# Patient Record
Sex: Male | Born: 1955
Health system: Southern US, Community
[De-identification: ages and names within clinical notes are randomized; demographics above are authoritative.]

## PROBLEM LIST (undated history)

## (undated) DIAGNOSIS — G4733 Obstructive sleep apnea (adult) (pediatric): Secondary | ICD-10-CM

## (undated) DIAGNOSIS — R51 Headache: Secondary | ICD-10-CM

## (undated) DIAGNOSIS — Z9989 Dependence on other enabling machines and devices: Secondary | ICD-10-CM

## (undated) DIAGNOSIS — R519 Headache, unspecified: Secondary | ICD-10-CM

## (undated) DIAGNOSIS — K219 Gastro-esophageal reflux disease without esophagitis: Secondary | ICD-10-CM

## (undated) DIAGNOSIS — I480 Paroxysmal atrial fibrillation: Secondary | ICD-10-CM

## (undated) DIAGNOSIS — Z9289 Personal history of other medical treatment: Secondary | ICD-10-CM

## (undated) DIAGNOSIS — L03211 Cellulitis of face: Secondary | ICD-10-CM

## (undated) DIAGNOSIS — R7989 Other specified abnormal findings of blood chemistry: Secondary | ICD-10-CM

## (undated) DIAGNOSIS — I2510 Atherosclerotic heart disease of native coronary artery without angina pectoris: Secondary | ICD-10-CM

## (undated) DIAGNOSIS — E669 Obesity, unspecified: Secondary | ICD-10-CM

## (undated) DIAGNOSIS — K7460 Unspecified cirrhosis of liver: Secondary | ICD-10-CM

## (undated) DIAGNOSIS — R001 Bradycardia, unspecified: Secondary | ICD-10-CM

## (undated) DIAGNOSIS — L409 Psoriasis, unspecified: Secondary | ICD-10-CM

## (undated) DIAGNOSIS — D696 Thrombocytopenia, unspecified: Secondary | ICD-10-CM

## (undated) DIAGNOSIS — I509 Heart failure, unspecified: Secondary | ICD-10-CM

## (undated) DIAGNOSIS — M1990 Unspecified osteoarthritis, unspecified site: Secondary | ICD-10-CM

## (undated) DIAGNOSIS — E785 Hyperlipidemia, unspecified: Secondary | ICD-10-CM

## (undated) DIAGNOSIS — K760 Fatty (change of) liver, not elsewhere classified: Secondary | ICD-10-CM

## (undated) DIAGNOSIS — R945 Abnormal results of liver function studies: Secondary | ICD-10-CM

## (undated) DIAGNOSIS — M545 Low back pain: Secondary | ICD-10-CM

## (undated) DIAGNOSIS — I639 Cerebral infarction, unspecified: Secondary | ICD-10-CM

## (undated) HISTORY — PX: CARDIAC CATHETERIZATION: SHX172

## (undated) HISTORY — DX: Gastro-esophageal reflux disease without esophagitis: K21.9

## (undated) HISTORY — PX: TONSILLECTOMY: SUR1361

## (undated) HISTORY — DX: Low back pain: M54.5

## (undated) HISTORY — DX: Psoriasis, unspecified: L40.9

## (undated) HISTORY — PX: OTHER SURGICAL HISTORY: SHX169

## (undated) HISTORY — PX: FACIAL COSMETIC SURGERY: SHX629

## (undated) HISTORY — DX: Morbid (severe) obesity due to excess calories: E66.01

## (undated) HISTORY — DX: Paroxysmal atrial fibrillation: I48.0

## (undated) HISTORY — PX: KNEE ARTHROPLASTY: SHX992

---

## 2003-12-12 ENCOUNTER — Inpatient Hospital Stay (HOSPITAL_COMMUNITY): Admission: EM | Admit: 2003-12-12 | Discharge: 2003-12-17 | Payer: Self-pay | Admitting: Emergency Medicine

## 2005-11-21 HISTORY — PX: LAPAROSCOPIC CHOLECYSTECTOMY: SUR755

## 2006-08-12 ENCOUNTER — Ambulatory Visit: Payer: Self-pay | Admitting: Internal Medicine

## 2006-08-12 ENCOUNTER — Inpatient Hospital Stay (HOSPITAL_COMMUNITY): Admission: EM | Admit: 2006-08-12 | Discharge: 2006-08-15 | Payer: Self-pay | Admitting: Emergency Medicine

## 2007-11-02 ENCOUNTER — Encounter: Payer: Self-pay | Admitting: Cardiology

## 2010-03-17 ENCOUNTER — Emergency Department (HOSPITAL_COMMUNITY): Admission: EM | Admit: 2010-03-17 | Discharge: 2010-03-17 | Payer: Self-pay | Admitting: Emergency Medicine

## 2011-07-29 ENCOUNTER — Ambulatory Visit (HOSPITAL_BASED_OUTPATIENT_CLINIC_OR_DEPARTMENT_OTHER)
Admission: RE | Admit: 2011-07-29 | Discharge: 2011-07-29 | Disposition: A | Discharge status: Home / Self Care | Payer: BC Managed Care – PPO | Source: Ambulatory Visit | Attending: Internal Medicine | Admitting: Internal Medicine

## 2011-07-29 ENCOUNTER — Encounter: Payer: Self-pay | Admitting: Internal Medicine

## 2011-07-29 ENCOUNTER — Ambulatory Visit (INDEPENDENT_AMBULATORY_CARE_PROVIDER_SITE_OTHER): Payer: BC Managed Care – PPO | Admitting: Internal Medicine

## 2011-07-29 DIAGNOSIS — L408 Other psoriasis: Secondary | ICD-10-CM

## 2011-07-29 DIAGNOSIS — R252 Cramp and spasm: Secondary | ICD-10-CM

## 2011-07-29 DIAGNOSIS — E785 Hyperlipidemia, unspecified: Secondary | ICD-10-CM

## 2011-07-29 DIAGNOSIS — R5383 Other fatigue: Secondary | ICD-10-CM | POA: Insufficient documentation

## 2011-07-29 DIAGNOSIS — M2550 Pain in unspecified joint: Secondary | ICD-10-CM

## 2011-07-29 DIAGNOSIS — R748 Abnormal levels of other serum enzymes: Secondary | ICD-10-CM

## 2011-07-29 DIAGNOSIS — K7689 Other specified diseases of liver: Secondary | ICD-10-CM | POA: Insufficient documentation

## 2011-07-29 DIAGNOSIS — L409 Psoriasis, unspecified: Secondary | ICD-10-CM | POA: Insufficient documentation

## 2011-07-29 DIAGNOSIS — R7402 Elevation of levels of lactic acid dehydrogenase (LDH): Secondary | ICD-10-CM

## 2011-07-29 DIAGNOSIS — M791 Myalgia, unspecified site: Secondary | ICD-10-CM

## 2011-07-29 DIAGNOSIS — R5381 Other malaise: Secondary | ICD-10-CM

## 2011-07-29 DIAGNOSIS — I2510 Atherosclerotic heart disease of native coronary artery without angina pectoris: Secondary | ICD-10-CM

## 2011-07-29 DIAGNOSIS — IMO0001 Reserved for inherently not codable concepts without codable children: Secondary | ICD-10-CM

## 2011-07-29 DIAGNOSIS — R945 Abnormal results of liver function studies: Secondary | ICD-10-CM | POA: Insufficient documentation

## 2011-07-29 DIAGNOSIS — F17200 Nicotine dependence, unspecified, uncomplicated: Secondary | ICD-10-CM | POA: Insufficient documentation

## 2011-07-29 LAB — HEPATIC FUNCTION PANEL
AST: 90 U/L — ABNORMAL HIGH (ref 0–37)
Albumin: 4.3 g/dL (ref 3.5–5.2)
Bilirubin, Direct: 0.1 mg/dL (ref 0.0–0.3)
Indirect Bilirubin: 0.5 mg/dL (ref 0.0–0.9)

## 2011-07-29 LAB — BASIC METABOLIC PANEL
Glucose, Bld: 92 mg/dL (ref 70–99)
Sodium: 139 mEq/L (ref 135–145)

## 2011-07-29 LAB — TSH: TSH: 1.573 u[IU]/mL (ref 0.350–4.500)

## 2011-07-29 LAB — SEDIMENTATION RATE: Sed Rate: 1 mm/hr (ref 0–16)

## 2011-07-29 LAB — CBC
HCT: 44.7 % (ref 39.0–52.0)
MCH: 31.2 pg (ref 26.0–34.0)
MCHC: 33.6 g/dL (ref 30.0–36.0)
MCV: 92.9 fL (ref 78.0–100.0)
Platelets: 101 10*3/uL — ABNORMAL LOW (ref 150–400)
RDW: 13 % (ref 11.5–15.5)

## 2011-07-29 LAB — RHEUMATOID FACTOR: Rhuematoid fact SerPl-aCnc: <10 IU/mL (ref <=14)

## 2011-07-29 MED ORDER — BUPROPION HCL ER (SR) 150 MG PO TB12: 150.0000 mg | ORAL_TABLET | Freq: Two times a day (BID) | ORAL | Status: DC

## 2011-07-29 NOTE — Assessment & Plan Note (Signed)
Followed by dermatology. Consider possible psoriatic arthritis pending lab workup. Consider rheumatology evaluation

## 2011-07-29 NOTE — Assessment & Plan Note (Signed)
Obtain cbc, chem7, tsh. Obtain testosterone next visit if early am.

## 2011-07-29 NOTE — Assessment & Plan Note (Signed)
Obtain lipid profile at next visit when fasting. Begin liver evaluation.

## 2011-07-29 NOTE — Assessment & Plan Note (Signed)
Obtain chem7 and CK. Begin mag ox po qd.

## 2011-07-29 NOTE — Progress Notes (Signed)
  Subjective:    Patient ID: Vincent Ochoa, male    DOB: February 12, 1956, 55 y.o.   MRN: [id number]  HPI Pt presents to clinic to establish care and for evaluation of multiple medical problems. States was told in the past by hospital and dermatologist had elevated LFT's. Unaware of further evaluation. Recalls possible remote hepatitis exposure and was given unspecified pills to take. Does not take tylenol and use etoh sparingly. Took MTX remotely for psoriasis but not for years. H/o CAD s/p stent placement ~ 2007 and possible 2010. Stopped all medications including ASA. Denies cp or dyspnea. Smoking ~ 1.5 ppd and is ready to attempt cessation. C/o diffuse intermittent muscle cramps. C/o fatigue, decreased libido and ED. Has intermittent MCP pain and swelling. Believes has intermittent swelling of individual fingers. BP elevated on triage but notes stressful conversation preceeding. Repeated bp 120/80. No other complaints.  Reviewed pmh, psh, medications, allergies, soc hx and fam hx    Review of Systems  Constitutional: Negative for fever, chills and unexpected weight change.  Respiratory: Negative for cough and shortness of breath.   Cardiovascular: Negative for chest pain and palpitations.  Gastrointestinal: Negative for abdominal pain and blood in stool.  Musculoskeletal: Positive for myalgias, joint swelling and arthralgias.  Skin: Positive for color change and rash.  All other systems reviewed and are negative.       Objective:   Physical Exam  Physical Exam  Nursing note and vitals reviewed. Constitutional: Appears well-developed and well-nourished. No distress.  HENT:  Head: Normocephalic and atraumatic.  Right Ear: External ear normal.  Left Ear: External ear normal.  Eyes: Conjunctivae are normal. No scleral icterus.  Neck: Neck supple. Carotid bruit is not present.  Cardiovascular: Normal rate, regular rhythm and normal heart sounds.  Exam reveals no gallop and no friction rub.     No murmur heard. Pulmonary/Chest: Effort normal and breath sounds normal. No respiratory distress. He has no wheezes. no rales.  Abd: soft, ND, NT, +BS. No masses or organomegaly. Lymphadenopathy:    He has no cervical adenopathy.  Neurological:Alert.  Skin: Skin is warm and dry. Not diaphoretic. +psoriatic plaques extensor surfaces Psychiatric: Has a normal mood and affect.   MSK: no sausage digits. No inflammatory changes of hands.       Assessment & Plan:

## 2011-07-29 NOTE — Assessment & Plan Note (Signed)
Asx. Begin asa 325mg  po qd. Risk factor modification. Discuss cardiology referral next visit

## 2011-07-29 NOTE — Assessment & Plan Note (Signed)
Obtain lft, iron, hepatitis panel and obtain RUQ [place]. Close followup scheduled.

## 2011-07-29 NOTE — Assessment & Plan Note (Signed)
Some inflammatory features by hx. Obtain esr and RF.

## 2011-07-29 NOTE — Assessment & Plan Note (Signed)
Counseled regarding the need for cessation and states agreement. Requests medication assistance. Begin otc nicotine patches and understands cannot smoke while using. Begin wellbutrin. Total time of discussion ~10 mins.

## 2011-07-29 NOTE — Patient Instructions (Signed)
Please take magnesium oxide one a day for cramps Please schedule next appointment in the morning for additional labs

## 2011-07-31 LAB — HEPATITIS PANEL, ACUTE
HCV Ab: NEGATIVE
Hep B C IgM: NEGATIVE

## 2011-08-03 ENCOUNTER — Other Ambulatory Visit: Payer: Self-pay | Admitting: *Deleted

## 2011-08-03 DIAGNOSIS — M791 Myalgia, unspecified site: Secondary | ICD-10-CM

## 2011-08-03 DIAGNOSIS — D696 Thrombocytopenia, unspecified: Secondary | ICD-10-CM

## 2011-08-06 LAB — PLATELET COUNT: Platelets: 112 10*3/uL — ABNORMAL LOW (ref 150–400)

## 2011-08-11 ENCOUNTER — Encounter: Payer: Self-pay | Admitting: Cardiology

## 2011-08-12 ENCOUNTER — Encounter: Payer: Self-pay | Admitting: Cardiology

## 2011-08-15 ENCOUNTER — Encounter: Payer: Self-pay | Admitting: Internal Medicine

## 2011-08-15 ENCOUNTER — Ambulatory Visit (INDEPENDENT_AMBULATORY_CARE_PROVIDER_SITE_OTHER): Payer: BC Managed Care – PPO | Admitting: Internal Medicine

## 2011-08-15 DIAGNOSIS — E785 Hyperlipidemia, unspecified: Secondary | ICD-10-CM

## 2011-08-15 DIAGNOSIS — I2510 Atherosclerotic heart disease of native coronary artery without angina pectoris: Secondary | ICD-10-CM

## 2011-08-15 DIAGNOSIS — R748 Abnormal levels of other serum enzymes: Secondary | ICD-10-CM | POA: Insufficient documentation

## 2011-08-15 DIAGNOSIS — K219 Gastro-esophageal reflux disease without esophagitis: Secondary | ICD-10-CM

## 2011-08-15 DIAGNOSIS — R6882 Decreased libido: Secondary | ICD-10-CM | POA: Insufficient documentation

## 2011-08-15 MED ORDER — NITROGLYCERIN 0.4 MG SL SUBL: 0.4000 mg | SUBLINGUAL_TABLET | SUBLINGUAL | Status: DC | PRN

## 2011-08-15 MED ORDER — OMEPRAZOLE 40 MG PO CPDR: 40.0000 mg | DELAYED_RELEASE_CAPSULE | Freq: Every day | ORAL | Status: DC

## 2011-08-15 NOTE — Assessment & Plan Note (Addendum)
Recent reported nl stress test. Continue asa daily. Provide with prescription for ntg sl prn. Cardiology referral for surveillance. Regular exercise encourage. Congratulated on tobacco cessation. Obtain lipid profile and discussed possible statin therapy though would not pursue currently with workup for elevated CK enzymes

## 2011-08-15 NOTE — Progress Notes (Signed)
  Subjective:    Patient ID: Vincent Ochoa, male    DOB: 02/28/1955, 55 y.o.   MRN: [id number]  HPI Pt presents to clinic for followup of multiple medical problems. Recently hospitalized at outside hospital for CP. Describes nonexertional low cp vs epigastric pain. No records available however per description appears underwent serial cardiac enzymes, ekg without evidence of infarct. Underwent chemical nuclear stress test prior to discharge that pt was told was nl.   Last visit was evaluated for diffuse muscle cramps and myalgias. CK noted to be ~2000 and confirmed stable on repeat last week. (was told at hospital total ck high as well.) does not take statin tx or any other medications other than ASA and wellbutrin. Renal fxn nl. No out of the ordinary injury or exercise recently. Renal fxn remains nl.   H/o intermittent arthralgias often involving MCP's. H/o psoriasis followed by dermatology. Recent ESR and RF nl.   Complains of chronic nightly sensation of something stuck in throat or irritating throat. Occurs after lying down. Has regular GERD sx's after eating.   Continues with decreased libido. Abnormal lft's reviewed with neg hepatitis panel, iron levels and US demonstrating fatty liver infiltration.  Congratulated on quitting smoking after last visit. Taking wellbutrin currently and did not begin nicotine patches.   Past Medical History  Diagnosis Date  . History of chicken pox     childhood  . GERD (gastroesophageal reflux disease)   . Psoriasis   . Chest pain    Past Surgical History  Procedure Date  . Cholecystectomy 2007    reports that he quit smoking about 2 weeks ago. His smoking use included Cigarettes. He has never used smokeless tobacco. He reports that he drinks alcohol. He reports that he does not use illicit drugs. family history includes Arthritis in his maternal grandmother; Cirrhosis in his mother; and Heart disease in his sister and unspecified family member. No Known  Allergies   Review of Systems see hpi    Objective:   Physical Exam  Nursing note and vitals reviewed. Constitutional: He appears well-developed and well-nourished. No distress.  HENT:  Head: Normocephalic and atraumatic.  Neurological: He is alert.  Skin: He is not diaphoretic.  Psychiatric: He has a normal mood and affect.          Assessment & Plan:

## 2011-08-15 NOTE — Assessment & Plan Note (Signed)
Begin omeprazole 40mg  po qd. Consider GI evaluation/EGD if no significant improvement after ~4 wks

## 2011-08-15 NOTE — Assessment & Plan Note (Signed)
Suspect underlying fatty liver disease. Recommend dietary modification, regular exercise and wt loss.

## 2011-08-15 NOTE — Assessment & Plan Note (Signed)
Obtain early am testosterone level 

## 2011-08-15 NOTE — Assessment & Plan Note (Signed)
Confirmed with repeat lab evaluations. Nl renal fxn and no obvious trigger. Schedule rheumatology consult.

## 2011-08-16 LAB — LIPID PANEL
LDL Cholesterol: 148 mg/dL — ABNORMAL HIGH (ref 0–99)
Total CHOL/HDL Ratio: 7.5 Ratio
Triglycerides: 164 mg/dL — ABNORMAL HIGH (ref <150)
VLDL: 33 mg/dL (ref 0–40)

## 2011-08-17 LAB — TESTOSTERONE: Testosterone: 342.93 ng/dL (ref 250–890)

## 2011-08-18 ENCOUNTER — Ambulatory Visit: Payer: BC Managed Care – PPO | Admitting: Internal Medicine

## 2011-08-24 ENCOUNTER — Institutional Professional Consult (permissible substitution): Payer: BC Managed Care – PPO | Admitting: Cardiology

## 2011-09-21 ENCOUNTER — Encounter: Payer: Self-pay | Admitting: Cardiology

## 2011-09-21 ENCOUNTER — Ambulatory Visit (INDEPENDENT_AMBULATORY_CARE_PROVIDER_SITE_OTHER): Payer: BC Managed Care – PPO | Admitting: Cardiology

## 2011-09-21 VITALS — BP 110/68 | HR 81 | Resp 18 | Ht 75.0 in | Wt 291.0 lb

## 2011-09-21 DIAGNOSIS — I2510 Atherosclerotic heart disease of native coronary artery without angina pectoris: Secondary | ICD-10-CM

## 2011-09-21 DIAGNOSIS — R079 Chest pain, unspecified: Secondary | ICD-10-CM | POA: Insufficient documentation

## 2011-09-21 NOTE — Assessment & Plan Note (Signed)
Patient's symptoms were atypical. Apparently his enzymes were negative although the CK total was elevated. He also had a stress test that apparently was normal. We will obtain all records from [facility]. Continued medical therapy including aspirin. If his CK is not extremely elevated we will add a statin. Patient counseled on lifestyle modification.

## 2011-09-21 NOTE — Progress Notes (Signed)
HPI: 55 year old male male for evaluation of chest pain. Patient admitted in 2007 with chest pain and had an abnormal nuclear study; enzymes negative. He subsequently underwent cardiac catheterization by Dr. Katrinka Blazing which revealed; EF is 60%. Ostial LM less than 30% narrowing. No significant LAD disease. 30-40 OM; The right coronary artery contains mid 30-40% stenosis with suggestion of possible ruptured plaque, but there is no flow-limiting lesion noted.  Medical therapy recommended. Echo in 2007 showed an EF of 50. Patient apparently had a cardiac catheterization at [facility] in 2008. No intervention. Records not available. Approximately 2 months ago the patient had 15 minutes of chest pain. It was described as a cramping sensation. He was admitted to [facility] and apparently ruled out. He does state that his CK was elevated. A stress test was normal by his report. Records not available. Since then he does have dyspnea on exertion but no orthopnea, PND, pedal edema, exertional chest pain or syncope.  Current Outpatient Prescriptions  Medication Sig Dispense Refill  . aspirin 81 MG tablet Take 81 mg by mouth daily. Takes 3 tabs daily       . buPROPion (WELLBUTRIN SR) 150 MG 12 hr tablet Take 1 tablet (150 mg total) by mouth 2 (two) times daily.  60 tablet  2  . nitroGLYCERIN (NITROSTAT) 0.4 MG SL tablet Place 1 tablet (0.4 mg total) under the tongue every 5 (five) minutes as needed for chest pain.  20 tablet  11  . omeprazole (PRILOSEC) 40 MG capsule Take 1 capsule (40 mg total) by mouth daily.  30 capsule  1    No Known Allergies  Past Medical History  Diagnosis Date  . History of chicken pox     childhood  . GERD (gastroesophageal reflux disease)   . Psoriasis     Past Surgical History  Procedure Date  . Cholecystectomy 2007  . Tonsillectomy     History   Social History  . Marital Status: Married    Spouse Name: N/A    Number of Children: N/A  . Years of  Education: N/A   Occupational History  . Not on file.   Social History Main Topics  . Smoking status: Former Smoker    Types: Cigarettes    Quit date: 08/01/2011  . Smokeless tobacco: Never Used   Comment: started 1973 1 ppd  . Alcohol Use: Yes     Occasional  . Drug Use: No  . Sexually Active: Not on file   Other Topics Concern  . Not on file   Social History Narrative  . No narrative on file    Family History  Problem Relation Age of Onset  . Arthritis Maternal Grandmother   . Heart disease Sister   . Heart disease      maternal/paternal grandparents  . Cirrhosis Mother     deceased age 89    ROS: fatigue and chronic cough but no fevers or chills, hemoptysis, dysphasia, odynophagia, melena, hematochezia, dysuria, hematuria, rash, seizure activity, orthopnea, PND, pedal edema, claudication. Remaining systems are negative.  Physical Exam:  Blood pressure 110/68, pulse 81, resp. rate 18, height 6\' 3"  (1.905 m), weight 291 lb (131.997 kg).  General:  Well developed/well nourished in NAD Skin warm/dry Patient not depressed No peripheral clubbing Back-normal HEENT-normal/normal eyelids Neck supple/normal carotid upstroke bilaterally; no bruits; no JVD; no thyromegaly chest - CTA/ normal expansion CV - RRR/normal S1 and S2; no murmurs, rubs or gallops;  PMI nondisplaced Abdomen -NT/ND, no  HSM, no mass, + bowel sounds, no bruit 2+ femoral pulses, no bruits Ext-no edema, chords, 2+ DP Neuro-grossly nonfocal  ECG NSR with no ECG changes

## 2011-09-21 NOTE — Patient Instructions (Signed)
Your physician wants you to follow-up in: ONE YEAR You will receive a reminder letter in the mail two months in advance. If you don't receive a letter, please call our office to schedule the follow-up appointment.  

## 2011-09-21 NOTE — Assessment & Plan Note (Signed)
Continue aspirin. Add statin if previous CK not elevated.

## 2011-09-22 ENCOUNTER — Telehealth: Payer: Self-pay | Admitting: Cardiology

## 2011-09-22 NOTE — Telephone Encounter (Addendum)
ROI faxed to [facility] @ [phone]  09/22/11/km  Records received from [facility] Gave to [place]  09/22/11/km

## 2011-09-28 ENCOUNTER — Ambulatory Visit: Payer: BC Managed Care – PPO | Admitting: Internal Medicine

## 2011-09-28 DIAGNOSIS — Z0289 Encounter for other administrative examinations: Secondary | ICD-10-CM

## 2012-08-13 ENCOUNTER — Emergency Department (HOSPITAL_BASED_OUTPATIENT_CLINIC_OR_DEPARTMENT_OTHER): Payer: Self-pay

## 2012-08-13 ENCOUNTER — Emergency Department (HOSPITAL_BASED_OUTPATIENT_CLINIC_OR_DEPARTMENT_OTHER)
Admission: EM | Admit: 2012-08-13 | Discharge: 2012-08-13 | Disposition: A | Discharge status: Home / Self Care | Payer: Self-pay | Attending: Emergency Medicine | Admitting: Emergency Medicine

## 2012-08-13 ENCOUNTER — Encounter (HOSPITAL_BASED_OUTPATIENT_CLINIC_OR_DEPARTMENT_OTHER): Payer: Self-pay | Admitting: *Deleted

## 2012-08-13 DIAGNOSIS — L408 Other psoriasis: Secondary | ICD-10-CM | POA: Insufficient documentation

## 2012-08-13 DIAGNOSIS — Z9089 Acquired absence of other organs: Secondary | ICD-10-CM | POA: Insufficient documentation

## 2012-08-13 DIAGNOSIS — I2510 Atherosclerotic heart disease of native coronary artery without angina pectoris: Secondary | ICD-10-CM | POA: Insufficient documentation

## 2012-08-13 DIAGNOSIS — K219 Gastro-esophageal reflux disease without esophagitis: Secondary | ICD-10-CM | POA: Insufficient documentation

## 2012-08-13 DIAGNOSIS — Z7982 Long term (current) use of aspirin: Secondary | ICD-10-CM | POA: Insufficient documentation

## 2012-08-13 DIAGNOSIS — J329 Chronic sinusitis, unspecified: Secondary | ICD-10-CM | POA: Insufficient documentation

## 2012-08-13 DIAGNOSIS — Z9861 Coronary angioplasty status: Secondary | ICD-10-CM | POA: Insufficient documentation

## 2012-08-13 DIAGNOSIS — Z87891 Personal history of nicotine dependence: Secondary | ICD-10-CM | POA: Insufficient documentation

## 2012-08-13 DIAGNOSIS — H8110 Benign paroxysmal vertigo, unspecified ear: Secondary | ICD-10-CM | POA: Insufficient documentation

## 2012-08-13 HISTORY — DX: Atherosclerotic heart disease of native coronary artery without angina pectoris: I25.10

## 2012-08-13 MED ORDER — MECLIZINE HCL 50 MG PO TABS: 50.0000 mg | ORAL_TABLET | Freq: Three times a day (TID) | ORAL | Status: DC | PRN

## 2012-08-13 MED ORDER — AMOXICILLIN-POT CLAVULANATE 500-125 MG PO TABS: 1.0000 | ORAL_TABLET | Freq: Three times a day (TID) | ORAL | Status: DC

## 2012-08-13 NOTE — ED Provider Notes (Signed)
History     CSN: [id number]  Arrival date & time 08/13/12  1252   First MD Initiated Contact with Patient 08/13/12 1342      Chief Complaint  Patient presents with  . Dizziness     HPI Patient with three-day history of vertigo and dizziness associated with nausea.  Symptoms started out as a head cold.  Symptoms reproduced with movement of the head.  Patient denies she'll lateral weakness or numbness.  Denies headache. Past Medical History  Diagnosis Date  . History of chicken pox     childhood  . GERD (gastroesophageal reflux disease)   . Psoriasis   . Coronary artery disease     Past Surgical History  Procedure Date  . Cholecystectomy 2007  . Tonsillectomy   . Cardiac stents     Family History  Problem Relation Age of Onset  . Arthritis Maternal Grandmother   . Heart disease Sister   . Heart disease      maternal/paternal grandparents  . Cirrhosis Mother     deceased age 69    History  Substance Use Topics  . Smoking status: Former Smoker    Types: Cigarettes    Quit date: 08/01/2011  . Smokeless tobacco: Never Used   Comment: started 1973 1 ppd  . Alcohol Use: Yes     Occasional      Review of Systems All his systems negative Allergies  Review of patient's allergies indicates no known allergies.  Home Medications   Current Outpatient Rx  Name Route Sig Dispense Refill  . AMOXICILLIN-POT CLAVULANATE 500-125 MG PO TABS Oral Take 1 tablet (500 mg total) by mouth every 8 (eight) hours. 21 tablet 0  . ASPIRIN 81 MG PO TABS Oral Take 81 mg by mouth daily. Takes 3 tabs daily     . BUPROPION HCL ER (SR) 150 MG PO TB12 Oral Take 1 tablet (150 mg total) by mouth 2 (two) times daily. 60 tablet 2  . MECLIZINE HCL 50 MG PO TABS Oral Take 1 tablet (50 mg total) by mouth 3 (three) times daily as needed. 30 tablet 0  . NITROGLYCERIN 0.4 MG SL SUBL Sublingual Place 1 tablet (0.4 mg total) under the tongue every 5 (five) minutes as needed for chest pain. 20 tablet  11  . OMEPRAZOLE 40 MG PO CPDR Oral Take 1 capsule (40 mg total) by mouth daily. 30 capsule 1    BP 133/78  Pulse 78  Temp 98.1 F (36.7 C) (Oral)  Resp 18  Ht 6\' 3"  (1.905 m)  Wt 295 lb (133.811 kg)  BMI 36.87 kg/m2  SpO2 96%  Physical Exam  Nursing note and vitals reviewed. Constitutional: He is oriented to person, place, and time. He appears well-developed. No distress.  HENT:  Head: Normocephalic and atraumatic.  Eyes: Pupils are equal, round, and reactive to light.  Neck: Normal range of motion.  Cardiovascular: Normal rate and intact distal pulses.   Pulmonary/Chest: No respiratory distress.  Abdominal: Normal appearance. He exhibits no distension.  Musculoskeletal: Normal range of motion.  Neurological: He is alert and oriented to person, place, and time. He has normal strength. No cranial nerve deficit or sensory deficit. He displays a negative Romberg sign. Coordination and gait normal. GCS eye subscore is 4. GCS verbal subscore is 5. GCS motor subscore is 6.  Skin: Skin is warm and dry. No rash noted.  Psychiatric: He has a normal mood and affect. His behavior is normal.    ED  Course  Procedures (including critical care time)  Labs Reviewed - No data to display Ct Head Wo Contrast  08/13/2012  *RADIOLOGY REPORT*  Clinical Data: Nausea for 3 days  CT HEAD WITHOUT CONTRAST  Technique:  Contiguous axial images were obtained from the base of the skull through the vertex without contrast.  Comparison: None.  Findings: No acute intracranial hemorrhage.  No focal mass lesion. No CT evidence of acute infarction.   No midline shift or mass effect.  No hydrocephalus.  Basilar cisterns are patent. Paranasal sinuses and mastoid air cells are clear.  Orbits are normal.  IMPRESSION: No acute intracranial findings.   Original Report Authenticated By: Genevive Bi, M.D.      1. Benign positional vertigo   2. Sinusitis       MDM         Nelia Shi, MD 08/13/12  1452

## 2012-08-13 NOTE — ED Notes (Signed)
Dizziness x 3 days. Started out with feeling like he had a head cold.

## 2012-11-21 DIAGNOSIS — L03211 Cellulitis of face: Secondary | ICD-10-CM

## 2012-11-21 DIAGNOSIS — M545 Low back pain, unspecified: Secondary | ICD-10-CM

## 2012-11-21 HISTORY — DX: Cellulitis of face: L03.211

## 2012-11-21 HISTORY — DX: Low back pain, unspecified: M54.50

## 2012-11-28 ENCOUNTER — Emergency Department (HOSPITAL_BASED_OUTPATIENT_CLINIC_OR_DEPARTMENT_OTHER): Payer: BC Managed Care – PPO

## 2012-11-28 ENCOUNTER — Inpatient Hospital Stay (HOSPITAL_BASED_OUTPATIENT_CLINIC_OR_DEPARTMENT_OTHER)
Admission: EM | Admit: 2012-11-28 | Discharge: 2012-11-30 | DRG: 277 | Disposition: A | Discharge status: Home / Self Care | Payer: BC Managed Care – PPO | Attending: Emergency Medicine | Admitting: Emergency Medicine

## 2012-11-28 ENCOUNTER — Encounter (HOSPITAL_BASED_OUTPATIENT_CLINIC_OR_DEPARTMENT_OTHER): Payer: Self-pay | Admitting: *Deleted

## 2012-11-28 DIAGNOSIS — R7402 Elevation of levels of lactic acid dehydrogenase (LDH): Secondary | ICD-10-CM | POA: Diagnosis present

## 2012-11-28 DIAGNOSIS — Z9089 Acquired absence of other organs: Secondary | ICD-10-CM

## 2012-11-28 DIAGNOSIS — Z23 Encounter for immunization: Secondary | ICD-10-CM

## 2012-11-28 DIAGNOSIS — Z9861 Coronary angioplasty status: Secondary | ICD-10-CM

## 2012-11-28 DIAGNOSIS — I252 Old myocardial infarction: Secondary | ICD-10-CM

## 2012-11-28 DIAGNOSIS — R7401 Elevation of levels of liver transaminase levels: Secondary | ICD-10-CM | POA: Diagnosis present

## 2012-11-28 DIAGNOSIS — F17200 Nicotine dependence, unspecified, uncomplicated: Secondary | ICD-10-CM | POA: Diagnosis present

## 2012-11-28 DIAGNOSIS — L03213 Periorbital cellulitis: Secondary | ICD-10-CM

## 2012-11-28 DIAGNOSIS — R748 Abnormal levels of other serum enzymes: Secondary | ICD-10-CM

## 2012-11-28 DIAGNOSIS — L03211 Cellulitis of face: Principal | ICD-10-CM | POA: Diagnosis present

## 2012-11-28 DIAGNOSIS — I2510 Atherosclerotic heart disease of native coronary artery without angina pectoris: Secondary | ICD-10-CM | POA: Diagnosis present

## 2012-11-28 DIAGNOSIS — D1739 Benign lipomatous neoplasm of skin and subcutaneous tissue of other sites: Secondary | ICD-10-CM | POA: Diagnosis present

## 2012-11-28 DIAGNOSIS — L0201 Cutaneous abscess of face: Principal | ICD-10-CM | POA: Diagnosis present

## 2012-11-28 DIAGNOSIS — D696 Thrombocytopenia, unspecified: Secondary | ICD-10-CM | POA: Diagnosis present

## 2012-11-28 DIAGNOSIS — Z79899 Other long term (current) drug therapy: Secondary | ICD-10-CM

## 2012-11-28 HISTORY — DX: Cellulitis of face: L03.211

## 2012-11-28 LAB — BASIC METABOLIC PANEL
CO2: 25 mEq/L (ref 19–32)
Chloride: 102 mEq/L (ref 96–112)
Creatinine, Ser: 1 mg/dL (ref 0.50–1.35)
GFR calc non Af Amer: 82 mL/min — ABNORMAL LOW (ref >90)
Glucose, Bld: 90 mg/dL (ref 70–99)
Potassium: 4.3 mEq/L (ref 3.5–5.1)

## 2012-11-28 LAB — CBC WITH DIFFERENTIAL/PLATELET
Basophils Relative: 0 % (ref 0–1)
Eosinophils Absolute: 0.2 10*3/uL (ref 0.0–0.7)
Eosinophils Relative: 3 % (ref 0–5)
MCHC: 35.2 g/dL (ref 30.0–36.0)
MCV: 89.9 fL (ref 78.0–100.0)
Monocytes Relative: 12 % (ref 3–12)
Neutro Abs: 4.5 10*3/uL (ref 1.7–7.7)
Platelets: 74 10*3/uL — ABNORMAL LOW (ref 150–400)

## 2012-11-28 MED ORDER — SODIUM CHLORIDE 0.9 % IV SOLN
INTRAVENOUS | Status: AC
  Administered 2012-11-28: via INTRAVENOUS

## 2012-11-28 MED ORDER — CLINDAMYCIN PHOSPHATE 900 MG/50ML IV SOLN
900.0000 mg | Freq: Four times a day (QID) | INTRAVENOUS | Status: DC
  Administered 2012-11-29 – 2012-11-30 (×6): 900 mg via INTRAVENOUS
  Filled 2012-11-28 (×8): qty 50

## 2012-11-28 MED ORDER — HYDROCODONE-ACETAMINOPHEN 5-325 MG PO TABS: 1.0000 | ORAL_TABLET | ORAL | Status: DC | PRN

## 2012-11-28 MED ORDER — SODIUM CHLORIDE 0.9 % IJ SOLN: 3.0000 mL | Freq: Two times a day (BID) | INTRAMUSCULAR | Status: DC

## 2012-11-28 MED ORDER — LIDOCAINE-EPINEPHRINE-TETRACAINE (LET) SOLUTION
NASAL | Status: AC
  Administered 2012-11-28: 3 mL via TOPICAL
  Filled 2012-11-28: qty 3

## 2012-11-28 MED ORDER — ASPIRIN EC 81 MG PO TBEC: 81.0000 mg | DELAYED_RELEASE_TABLET | Freq: Every day | ORAL | Status: DC

## 2012-11-28 MED ORDER — INFLUENZA VIRUS VACC SPLIT PF IM SUSP
0.5000 mL | INTRAMUSCULAR | Status: AC
  Administered 2012-11-29: 0.5 mL via INTRAMUSCULAR
  Filled 2012-11-28: qty 0.5

## 2012-11-28 MED ORDER — CLINDAMYCIN PHOSPHATE 900 MG/50ML IV SOLN
900.0000 mg | Freq: Once | INTRAVENOUS | Status: AC
  Administered 2012-11-28: 900 mg via INTRAVENOUS
  Filled 2012-11-28: qty 50

## 2012-11-28 MED ORDER — DOCUSATE SODIUM 100 MG PO CAPS
100.0000 mg | ORAL_CAPSULE | Freq: Two times a day (BID) | ORAL | Status: DC
  Administered 2012-11-29 (×3): 100 mg via ORAL
  Filled 2012-11-28 (×3): qty 1

## 2012-11-28 MED ORDER — SODIUM CHLORIDE 0.9 % IV BOLUS (SEPSIS)
1000.0000 mL | Freq: Once | INTRAVENOUS | Status: AC
  Administered 2012-11-28: 1000 mL via INTRAVENOUS

## 2012-11-28 MED ORDER — ACETAMINOPHEN 325 MG PO TABS
650.0000 mg | ORAL_TABLET | Freq: Four times a day (QID) | ORAL | Status: DC | PRN
  Administered 2012-11-30: 650 mg via ORAL
  Filled 2012-11-28: qty 2

## 2012-11-28 MED ORDER — HYDROMORPHONE HCL PF 1 MG/ML IJ SOLN
0.5000 mg | INTRAMUSCULAR | Status: DC | PRN
  Administered 2012-11-28 – 2012-11-29 (×3): 0.5 mg via INTRAVENOUS
  Filled 2012-11-28 (×3): qty 1

## 2012-11-28 MED ORDER — LIDOCAINE-EPINEPHRINE-TETRACAINE (LET) SOLUTION
3.0000 mL | Freq: Once | NASAL | Status: AC
  Administered 2012-11-28: 3 mL via TOPICAL

## 2012-11-28 MED ORDER — ONDANSETRON HCL 4 MG PO TABS
4.0000 mg | ORAL_TABLET | Freq: Four times a day (QID) | ORAL | Status: DC | PRN
  Administered 2012-11-29: 4 mg via ORAL
  Filled 2012-11-28: qty 1

## 2012-11-28 MED ORDER — IOHEXOL 300 MG/ML  SOLN
75.0000 mL | Freq: Once | INTRAMUSCULAR | Status: AC | PRN
  Administered 2012-11-28: 75 mL via INTRAVENOUS

## 2012-11-28 MED ORDER — ONDANSETRON HCL 4 MG/2ML IJ SOLN: 4.0000 mg | Freq: Four times a day (QID) | INTRAMUSCULAR | Status: DC | PRN

## 2012-11-28 MED ORDER — PNEUMOCOCCAL VAC POLYVALENT 25 MCG/0.5ML IJ INJ
0.5000 mL | INJECTION | INTRAMUSCULAR | Status: AC
  Administered 2012-11-29: 0.5 mL via INTRAMUSCULAR
  Filled 2012-11-28: qty 0.5

## 2012-11-28 MED ORDER — ACETAMINOPHEN 650 MG RE SUPP: 650.0000 mg | Freq: Four times a day (QID) | RECTAL | Status: DC | PRN

## 2012-11-28 NOTE — ED Provider Notes (Signed)
History     CSN: [id number]  Arrival date & time 11/28/12  1656   First MD Initiated Contact with Patient 11/28/12 1715      Chief Complaint  Patient presents with  . Facial Swelling     HPI  The patient presents with facial pain and erythema.  He states that his symptoms began 3 days ago, soon after having elective removal of a wart on the right maxillary prominence.  Since onset he has had increasing erythema, edema about this area, and throughout the lateral right nasal bridge, and right.  Orbital areas.  The swelling is profound, blocking the patient's vision.  When the patient retracts his eyelid he states that his acuity is unremarkable, unchanged. He notes increasing headache, right-sided retro-orbital over this timeframe. During the procedure, the patient was made aware of another lesion, which the practitioner stated would need drainage.  The patient was started on antibiotics, and states that he has taken them since the procedure. He notes a subjective fever, without fever, vomiting, diarrhea, chest pain, dyspnea, or any other new focal complaints.   Past Medical History  Diagnosis Date  . History of chicken pox     childhood  . GERD (gastroesophageal reflux disease)   . Psoriasis   . Coronary artery disease     Past Surgical History  Procedure Date  . Cholecystectomy 2007  . Tonsillectomy   . Cardiac stents     Family History  Problem Relation Age of Onset  . Arthritis Maternal Grandmother   . Heart disease Sister   . Heart disease      maternal/paternal grandparents  . Cirrhosis Mother     deceased age 24    History  Substance Use Topics  . Smoking status: Former Smoker    Types: Cigarettes    Quit date: 08/01/2011  . Smokeless tobacco: Never Used     Comment: started 1973 1 ppd  . Alcohol Use: Yes     Comment: Occasional      Review of Systems  Constitutional:       Per HPI, otherwise negative  HENT:       Per HPI, otherwise negative    Eyes: Negative.   Respiratory:       Per HPI, otherwise negative  Cardiovascular:       Per HPI, otherwise negative  Gastrointestinal: Negative for vomiting.  Genitourinary: Negative.   Musculoskeletal:       Per HPI, otherwise negative  Skin: Negative.   Neurological: Negative for syncope.    Allergies  Review of patient's allergies indicates no known allergies.  Home Medications   Current Outpatient Rx  Name  Route  Sig  Dispense  Refill  . AMOXICILLIN-POT CLAVULANATE 500-125 MG PO TABS   Oral   Take 1 tablet (500 mg total) by mouth every 8 (eight) hours.   21 tablet   0   . ASPIRIN 81 MG PO TABS   Oral   Take 81 mg by mouth daily. Takes 3 tabs daily          . BUPROPION HCL ER (SR) 150 MG PO TB12   Oral   Take 1 tablet (150 mg total) by mouth 2 (two) times daily.   60 tablet   2   . MECLIZINE HCL 50 MG PO TABS   Oral   Take 1 tablet (50 mg total) by mouth 3 (three) times daily as needed.   30 tablet   0   . NITROGLYCERIN  0.4 MG SL SUBL   Sublingual   Place 1 tablet (0.4 mg total) under the tongue every 5 (five) minutes as needed for chest pain.   20 tablet   11   . OMEPRAZOLE 40 MG PO CPDR   Oral   Take 1 capsule (40 mg total) by mouth daily.   30 capsule   1     BP 132/83  Pulse 96  Temp 98.3 F (36.8 C) (Oral)  Resp 16  Ht 6\' 3"  (1.905 m)  Wt 280 lb (127.007 kg)  BMI 35.00 kg/m2  SpO2 94%  Physical Exam  Nursing note and vitals reviewed. Constitutional: He is oriented to person, place, and time. He appears well-developed. No distress.  HENT:  Head: Normocephalic and atraumatic.    Eyes: Conjunctivae normal and EOM are normal. Pupils are equal, round, and reactive to light. Right eye exhibits no chemosis and no exudate.       Acuity is appropriate, unchanged according to the patient  Cardiovascular: Normal rate and regular rhythm.   Pulmonary/Chest: Effort normal. No stridor. No respiratory distress.  Abdominal: He exhibits no  distension.  Musculoskeletal: He exhibits no edema.  Neurological: He is alert and oriented to person, place, and time.  Skin: Skin is warm and dry.  Psychiatric: He has a normal mood and affect.    ED Course  Procedures (including critical care time)  Labs Reviewed  CBC WITH DIFFERENTIAL - Abnormal; Notable for the following:    Platelets 74 (*)     All other components within normal limits  BASIC METABOLIC PANEL   No results found.   No diagnosis found.  INCISION AND DRAINAGE Performed by: Gerhard Munch Consent: Verbal consent obtained. Risks and benefits: risks, benefits and alternatives were discussed Type: abscess  Body area: face- nasal bridge  Anesthesia: local infiltration  Incision was made with a scalpel. (11bl)  Local anesthetic: lidocaine 1% no epinephrine after applying LET for 30 minutes  Anesthetic total: 1 ml  Complexity: complex Blunt dissection to break up loculations  Drainage: purulent  Drainage amount: thick and then runny pus, ~73ml   Patient tolerance: Patient tolerated the procedure well with no immediate complications.     MDM  This patient presents with facial swelling, erythema, pain.  Given the patient's endorsement of a recent elective dermatologic procedure, there is some suspicion of recent seeding of cutaneous bacteria, though the patient has both evidence of cellulitis and a focal abscess present.  With the profound erythema, pain, there is suspicion of post-septal cellulitis, particularly with the patient's endorsement of a headache.  This was not demonstrated on CT scan.  The patient's visual acuity was intact, and he received IV antibiotics, given the failure to respond to oral medication.  Within the facial cellulitis, the patient required admission for evaluation, management, observation.  The patient tolerated incision and drainage well.        Gerhard Munch, MD 11/28/12 [phone]

## 2012-11-28 NOTE — ED Notes (Signed)
Pt c/o right facial swelling and redness x 3 days, wart removed from face x 3 days ago

## 2012-11-28 NOTE — ED Notes (Signed)
Pt also reports taking new course of Septra

## 2012-11-28 NOTE — ED Notes (Signed)
[facility] here for transport now. 

## 2012-11-28 NOTE — ED Notes (Signed)
Patient transported to CT 

## 2012-11-28 NOTE — ED Notes (Signed)
Report given to [facility] 

## 2012-11-29 ENCOUNTER — Encounter (HOSPITAL_COMMUNITY): Payer: Self-pay | Admitting: Internal Medicine

## 2012-11-29 ENCOUNTER — Ambulatory Visit: Payer: BC Managed Care – PPO | Admitting: Internal Medicine

## 2012-11-29 DIAGNOSIS — L03213 Periorbital cellulitis: Secondary | ICD-10-CM | POA: Diagnosis present

## 2012-11-29 DIAGNOSIS — H00039 Abscess of eyelid unspecified eye, unspecified eyelid: Secondary | ICD-10-CM

## 2012-11-29 DIAGNOSIS — D696 Thrombocytopenia, unspecified: Secondary | ICD-10-CM | POA: Diagnosis present

## 2012-11-29 DIAGNOSIS — L0201 Cutaneous abscess of face: Secondary | ICD-10-CM | POA: Diagnosis present

## 2012-11-29 DIAGNOSIS — I2510 Atherosclerotic heart disease of native coronary artery without angina pectoris: Secondary | ICD-10-CM

## 2012-11-29 LAB — COMPREHENSIVE METABOLIC PANEL WITH GFR
ALT: 53 U/L (ref 0–53)
AST: 48 U/L — ABNORMAL HIGH (ref 0–37)
Albumin: 3.5 g/dL (ref 3.5–5.2)
Alkaline Phosphatase: 78 U/L (ref 39–117)
BUN: 13 mg/dL (ref 6–23)
CO2: 27 meq/L (ref 19–32)
Calcium: 9.1 mg/dL (ref 8.4–10.5)
Chloride: 100 meq/L (ref 96–112)
Creatinine, Ser: 1.05 mg/dL (ref 0.50–1.35)
GFR calc Af Amer: 90 mL/min — ABNORMAL LOW
GFR calc non Af Amer: 78 mL/min — ABNORMAL LOW
Glucose, Bld: 111 mg/dL — ABNORMAL HIGH (ref 70–99)
Potassium: 4 meq/L (ref 3.5–5.1)
Sodium: 138 meq/L (ref 135–145)
Total Bilirubin: 0.4 mg/dL (ref 0.3–1.2)
Total Protein: 7.3 g/dL (ref 6.0–8.3)

## 2012-11-29 LAB — CBC WITH DIFFERENTIAL/PLATELET
Basophils Absolute: 0 10*3/uL (ref 0.0–0.1)
Eosinophils Absolute: 0.2 10*3/uL (ref 0.0–0.7)
Eosinophils Relative: 3 % (ref 0–5)
MCH: 31.7 pg (ref 26.0–34.0)
MCV: 90.9 fL (ref 78.0–100.0)
Neutrophils Relative %: 50 % (ref 43–77)
Platelets: 69 10*3/uL — ABNORMAL LOW (ref 150–400)
RDW: 13.4 % (ref 11.5–15.5)
WBC: 7.9 10*3/uL (ref 4.0–10.5)

## 2012-11-29 LAB — FOLATE RBC: RBC Folate: 676 ng/mL — ABNORMAL HIGH (ref >=366)

## 2012-11-29 LAB — PHOSPHORUS: Phosphorus: 3.6 mg/dL (ref 2.3–4.6)

## 2012-11-29 LAB — MAGNESIUM: Magnesium: 1.9 mg/dL (ref 1.5–2.5)

## 2012-11-29 LAB — VITAMIN B12: Vitamin B-12: 391 pg/mL (ref 211–911)

## 2012-11-29 LAB — HEMOGLOBIN A1C: Mean Plasma Glucose: 123 mg/dL — ABNORMAL HIGH (ref <117)

## 2012-11-29 LAB — TSH: TSH: 3.911 u[IU]/mL (ref 0.350–4.500)

## 2012-11-29 LAB — HIV ANTIBODY (ROUTINE TESTING W REFLEX): HIV: NONREACTIVE

## 2012-11-29 NOTE — Progress Notes (Addendum)
[facility] PROGRESS NOTE  Vincent Ochoa ZOX:[id number] DOB: May 04, 1956 DOA: 11/28/2012 PCP: Letitia Libra, Ala Dach, MD Dermatologist: Joyice Faster [phone])  Assessment/Plan: 1. Preseptal cellulitis right eye, abscess of nasal bridge: Improving on IV clindamycin. Continue empiric antibiotic therapy. No signs or symptoms to suggest orbital cellulitis and imaging was consistent with clinical impression of preseptal cellulitis.Marland Kitchen Unfortunately no culture was sent from incision and drainage in the emergency department. 2. Thrombocytopenia: Stable. This was noted in 2012 as well 3. Elevated AST: Noted 2012, fatty liver disease was suspected at that time. Consider outpatient evaluation and hepatitis panel if not previously done. Defer to outpatient setting. Not on statin. 4. GERD: Stable. Continue PPI. 5. Coronary artery disease, history of stent placement: Stable. Continue aspirin.  DVT prophylaxis: SCDs. No history of progressive pain.  Code Status: Full code Family Communication: Discussed with wife at bedside Disposition Plan: Home when improved, and this may one to 2 days.  [name], MD  [facility] Team 4  Pager [phone] If 7PM-7AM, please contact night-coverage at [email or web address], password [facility] 11/29/2012, 7:47 AM  LOS: 1 day   Brief narrative: 3 days ago he went to his dermatologist Joyice Faster [phone]) for facial wart removal. She removed a wart from the cheek ajecent to the right side of the nose. At the same time she noted a small abscess next to the conner of his right eye. She prescribed him bactrim and was planning to follow up. After 3 days patient developed significant swelling and was not able to open his Right eye. He presented to [facility] ED and had an abscess drained. CT scan showed periorbital cellulitis with out evidence of abscess. He was started on clindamycin.  He was transferred to [facility] for IV antibiotic management.  Of note about 2 weeks ago he  was told that routine testing showed low platelate he was supposed to be seen by his PCP but never made it to the appointment. Denies any easy bleeding or bruising no rashes.   Procedures:  1/8: Incision/drainage face/nasal bridge in the emergency department. No culture sent.  Antibiotics:  Clindamycin 1/9 >>  HPI/Subjective: Much better today. Eyelid edema markedly decreased, now able to see out of eye without having to prop open eyelid. Minimal pain of eyelid now. Vision intact, no pain with eye movement, no diplopia.  Objective: Filed Vitals:   11/28/12 2051 11/28/12 2258 11/29/12 0153 11/29/12 0539  BP: 130/68 117/73 115/62 108/70  Pulse: 81 88 77 70  Temp: 98.9 F (37.2 C) 98.1 F (36.7 C) 98.3 F (36.8 C) 98.1 F (36.7 C)  TempSrc:  Oral Oral Oral  Resp: 16 19 18 18   Height:      Weight:      SpO2: 100% 95% 94% 92%    Intake/Output Summary (Last 24 hours) at 11/29/12 0747 Last data filed at 11/29/12 0553  Gross per 24 hour  Intake    530 ml  Output    700 ml  Net   -170 ml   Filed Weights   11/28/12 1707  Weight: 127.007 kg (280 lb)    Exam:  General:  Appears calm and comfortable Eyes: Left eye, eyelid appears unremarkable. Pupils equal, round, reactive to light. Right upper and lower eyelids edematous upper greater than lower. Mild erythema of the upper and lower periorbital area. No fluctuance, minimal tenderness to palpation. He medial superior aspect of the bony orbit/bridge of nose has mild to moderate tenderness to palpation, no fluctuance in the area  of incision and drainage. Extraocular movements are intact bilaterally. There is no chemosis of the right conjunctiva ENT: Nose appears unremarkable. Face otherwise unremarkable. Area of previous wart removal healing well. Cardiovascular: RRR, no m/r/g. No LE edema. Telemetry: SR, no arrhythmias  Respiratory: CTA bilaterally, no w/r/r. Normal respiratory effort. Skin: As above Psychiatric: grossly normal  mood and affect, speech fluent and appropriate  Data Reviewed: Basic Metabolic Panel:  Lab 11/29/12 6213 11/28/12 1730  NA 138 138  K 4.0 4.3  CL 100 102  CO2 27 25  GLUCOSE 111* 90  BUN 13 13  CREATININE 1.05 1.00  CALCIUM 9.1 9.4  MG 1.9 --  PHOS 3.6 --   Liver Function Tests:  Lab 11/29/12 0505  AST 48*  ALT 53  ALKPHOS 78  BILITOT 0.4  PROT 7.3  ALBUMIN 3.5   CBC:  Lab 11/29/12 0505 11/28/12 1730  WBC 7.9 8.0  NEUTROABS 4.0 4.5  HGB 14.6 16.0  HCT 41.8 45.4  MCV 90.9 89.9  PLT 69* 74*   Studies: Ct Orbits W/cm  11/28/2012  *RADIOLOGY REPORT*  Clinical Data: Wart  removed from face.  Swelling and pain.  CT ORBITS WITH CONTRAST  Technique:  Multidetector CT imaging of the orbits was performed following the bolus administration of intravenous contrast.  Contrast: 75mL OMNIPAQUE IOHEXOL 300 MG/ML  SOLN  Comparison: None.  Findings:  There is mild preseptal periorbital soft tissue swelling on the right.  There is no post-septal inflammation.  The globes are intact.  The left orbit is negative.  There is no paranasal sinus disease.  Bony confines of the face and orbit are unremarkable.  Visualized intracranial compartment is negative.  IMPRESSION: Right preseptal periorbital soft tissue swelling consistent with cellulitis.  No post-septal inflammation.  No visible abscess.   Original Report Authenticated By: Davonna Belling, M.D.     Scheduled Meds:   . sodium chloride   Intravenous STAT  . clindamycin (CLEOCIN) IV  900 mg Intravenous Q6H  . docusate sodium  100 mg Oral BID  . influenza  inactive virus vaccine  0.5 mL Intramuscular Tomorrow-1000  . pneumococcal 23 valent vaccine  0.5 mL Intramuscular Tomorrow-1000  . sodium chloride  3 mL Intravenous Q12H   Continuous Infusions:   Principal Problem:  *Periorbital cellulitis Active Problems:  CAD (coronary artery disease)  Abscess of face  Thrombocytopenia  Elevated AST (SGOT)     [name], MD  [facility] Team 4  Pager [phone] If 7PM-7AM, please contact night-coverage at [email or web address], password [facility] 11/29/2012, 7:47 AM  LOS: 1 day   Time spent: 35 minutes

## 2012-11-29 NOTE — H&P (Signed)
PCP:   Letitia Libra, Ala Dach, MD [facility]   Chief Complaint:   Eye swelling  HPI: Vincent Ochoa is a 57 y.o. male   has a past medical history of History of chicken pox; GERD (gastroesophageal reflux disease); Psoriasis; Myocardial infarction (2006? 2010); Anginal pain (2006?; 2010); Pneumonia (2012); History of bronchitis; Daily headache; Facial cellulitis (11/28/2012); and Coronary artery disease.   Presented with  3 days ago he went to his dermatologist Joyice Faster [phone]) for facial wart removal. She removed a wart from the cheek ajecent to the right side of the nose. At the same time she noted a small abscess next to the conner of his right eye. She prescribed him bactrim and was planning to follow up. After 3 days patient developed significant swelling and was not able to open his Right eye. He presented to [facility] ED and had an abscess drained. CT scan showed periorbital cellulitis with out evidence of abscess. He was started on clindamycin. He was transferred to [facility] for IV antibiotic management.   Of note about 2 weeks ago he was told that routine testing showed low platelate he was supposed to be seen by his PCP but never made it to the appointment. Denies any easy bleeding or bruising no rashes.   Review of Systems:    Pertinent positives include: not feeling well for a few weeks, leg cramps  Constitutional:  No weight loss, night sweats, Fevers, chills, fatigue, weight loss  HEENT:  No headaches, Difficulty swallowing,Tooth/dental problems,Sore throat,  No sneezing, itching, ear ache, nasal congestion, post nasal drip,  Cardio-vascular:  No chest pain, Orthopnea, PND, anasarca, dizziness, palpitations.no Bilateral lower extremity swelling  GI:  No heartburn, indigestion, abdominal pain, nausea, vomiting, diarrhea, change in bowel habits, loss of appetite, melena, blood in stool, hematemesis Resp:  no shortness of breath at rest. No dyspnea on exertion, No  excess mucus, no productive cough, No non-productive cough, No coughing up of blood.No change in color of mucus.No wheezing. Skin:  no rash or lesions. No jaundice GU:  no dysuria, change in color of urine, no urgency or frequency. No straining to urinate.  No flank pain.  Musculoskeletal:  No joint pain or no joint swelling. No decreased range of motion. No back pain.  Psych:  No change in mood or affect. No depression or anxiety. No memory loss.  Neuro: no localizing neurological complaints, no tingling, no weakness, no double vision, no gait abnormality, no slurred speech, no confusion  Otherwise ROS are negative except for above, 10 systems were reviewed  Past Medical History: Past Medical History  Diagnosis Date  . History of chicken pox     childhood  . GERD (gastroesophageal reflux disease)   . Psoriasis   . Myocardial infarction 2006? 2010  . Anginal pain 2006?; 2010  . Pneumonia 2012  . History of bronchitis     "not chronic; have had it a few times" (11/28/2012)  . Daily headache     "qod" (11/28/2012)  . Facial cellulitis 11/28/2012    "wart removed under right eye 11/25/2012)  . Coronary artery disease     stents 2005   Past Surgical History  Procedure Date  . Tonsillectomy 1960's  . Cholecystectomy 2007  . Knee arthroplasty 1970's    "both; I used to rodeo" (11/28/2012)  . Coronary angioplasty with stent placement ~ 2006    "2" (11/28/2012)  . Coronary angioplasty 2009  . Facial cosmetic surgery 11/25/2012    "  wart removed under right eye; presents to hospital w/right eye swelling 11/28/2012)     Medications: Prior to Admission medications   Medication Sig Start Date End Date Taking? Authorizing Provider  sulfamethoxazole-trimethoprim (BACTRIM DS) 800-160 MG per tablet Take 1 tablet by mouth 2 (two) times daily.   Yes Historical Provider, MD    Allergies:  No Known Allergies  Social History:  Ambulatory   independently   Lives at  Home with wife   reports  that he has been smoking Cigarettes.  He has a 43 pack-year smoking history. His smokeless tobacco use includes Chew. He reports that he drinks alcohol. He reports that he does not use illicit drugs.   Family History: family history includes Arthritis in his maternal grandmother; Cancer - Ovarian in his sister; Cirrhosis in his mother; Diabetes in his sister; and Heart disease in his sister and unspecified family member.    Physical Exam: Patient Vitals for the past 24 hrs:  BP Temp Temp src Pulse Resp SpO2 Height Weight  11/28/12 2258 117/73 mmHg 98.1 F (36.7 C) Oral 88  19  95 % - -  11/28/12 2051 130/68 mmHg 98.9 F (37.2 C) - 81  16  100 % - -  11/28/12 1935 121/71 mmHg - - 79  18  95 % - -  11/28/12 1834 120/70 mmHg - - 75  22  96 % - -  11/28/12 1708 132/83 mmHg 98.3 F (36.8 C) Oral 96  16  94 % - -  11/28/12 1707 - - - - - - 6\' 3"  (1.905 m) 127.007 kg (280 lb)    1. General:  in No Acute distress 2. Psychological: Alert and Oriented 3. Head/ENT:     Dry Mucous Membranes                          Head Non traumatic, neck supple                          Normal   Dentition                         Normal visual acuity noted, able to move affected eye without pain.  4. SKIN:   decreased Skin turgor,  Skin clean Dry and intact mild pitichia noted on the trunk.  5. Heart: Regular rate and rhythm no Murmur, Rub or gallop 6. Lungs: Clear to auscultation bilaterally, no wheezes or crackles   7. Abdomen: Soft, non-tender, Non distended, ventral hernia noted, slightly enlarged liver noted.  8. Lower extremities: no clubbing, cyanosis, or edema 9. Neurologically CN 2-12 intact, strength 5/5 bilaterally in all 4 extrimities 10. MSK: Normal range of motion 11. Chest a 1x2 cm subcutaneous nodule noted above right nipple at 2 o'clock. It is non-tender but firm to palpation.   body mass index is 35.00 kg/(m^2).   Labs on Admission:   [facility] 11/28/12 1730  NA 138  K 4.3  CL 102    CO2 25  GLUCOSE 90  BUN 13  CREATININE 1.00  CALCIUM 9.4  MG --  PHOS --   No results found for this basename: AST:2,ALT:2,ALKPHOS:2,BILITOT:2,PROT:2,ALBUMIN:2 in the last 72 hours No results found for this basename: LIPASE:2,AMYLASE:2 in the last 72 hours  Basename 11/28/12 1730  WBC 8.0  NEUTROABS 4.5  HGB 16.0  HCT 45.4  MCV 89.9  PLT 74*  No results found for this basename: CKTOTAL:3,CKMB:3,CKMBINDEX:3,TROPONINI:3 in the last 72 hours No results found for this basename: TSH,T4TOTAL,FREET3,T3FREE,THYROIDAB in the last 72 hours No results found for this basename: VITAMINB12:2,FOLATE:2,FERRITIN:2,TIBC:2,IRON:2,RETICCTPCT:2 in the last 72 hours No results found for this basename: HGBA1C    Estimated Creatinine Clearance: 118.4 ml/min (by C-G formula based on Cr of 1). ABG No results found for this basename: phart, pco2, po2, hco3, tco2, acidbasedef, o2sat     No results found for this basename: DDIMER     Cultures: No results found for this basename: sdes, specrequest, cult, reptstatus       Radiological Exams on Admission: Ct Orbits W/cm  11/28/2012  *RADIOLOGY REPORT*  Clinical Data: Wart  removed from face.  Swelling and pain.  CT ORBITS WITH CONTRAST  Technique:  Multidetector CT imaging of the orbits was performed following the bolus administration of intravenous contrast.  Contrast: 75mL OMNIPAQUE IOHEXOL 300 MG/ML  SOLN  Comparison: None.  Findings:  There is mild preseptal periorbital soft tissue swelling on the right.  There is no post-septal inflammation.  The globes are intact.  The left orbit is negative.  There is no paranasal sinus disease.  Bony confines of the face and orbit are unremarkable.  Visualized intracranial compartment is negative.  IMPRESSION: Right preseptal periorbital soft tissue swelling consistent with cellulitis.  No post-septal inflammation.  No visible abscess.   Original Report Authenticated By: Davonna Belling, M.D.     Chart has been  reviewed  Assessment/Plan  57 yo Male here with facial/periorbital  cellulitis  Present on Admission:  . Abscess of face small abscess on the bridge of the nose has been drained by ED, will need to follow up to ensure resolution,  . Periorbital cellulitis - continue clindamycin, monitor for improvement and would re image if increased suspicion for drainable abscess.  Marland Kitchen CAD (coronary artery disease) - currently stable . Thrombocytopenia - per records this is chronic, but worse from prior, will order HIV serology, ANA and blood smear  If evidence of LFT's abnormality would obtain US of the abdomen to evaluate for organomegaly and or cirrhosis. Patient has hx of LFT's elevation in the past and family hx of non-alcoholic cirrhosis Subcutaneous nodule - most likely lipoma but could be followed up as an outpatient for further imaging.   Prophylaxis: SCD    CODE STATUS: FULL CODE  Other plan as per orders.  I have spent a total of  55 min on this admission  Shalini Mair 11/29/2012, 1:07 AM

## 2012-11-30 DIAGNOSIS — L03211 Cellulitis of face: Principal | ICD-10-CM

## 2012-11-30 DIAGNOSIS — R748 Abnormal levels of other serum enzymes: Secondary | ICD-10-CM

## 2012-11-30 LAB — ANA: Anti Nuclear Antibody(ANA): NEGATIVE

## 2012-11-30 MED ORDER — CLINDAMYCIN HCL 300 MG PO CAPS: 300.0000 mg | ORAL_CAPSULE | Freq: Three times a day (TID) | ORAL | Status: DC

## 2012-11-30 MED ORDER — CLINDAMYCIN HCL 300 MG PO CAPS
300.0000 mg | ORAL_CAPSULE | Freq: Three times a day (TID) | ORAL | Status: DC
  Filled 2012-11-30 (×3): qty 1

## 2012-11-30 NOTE — Progress Notes (Signed)
[facility] PROGRESS NOTE  Vincent Ochoa ZOX:[id number] DOB: Jun 19, 1956 DOA: 11/28/2012 PCP: Letitia Libra, Ala Dach, MD Dermatologist: Joyice Faster [phone])  Assessment/Plan: 1. Preseptal cellulitis right eye, abscess of nasal bridge: Rapidly improving on IV clindamycin. Change to oral clindamycin and discharged home with followup next week. No signs or symptoms to suggest orbital cellulitis and imaging was consistent with clinical impression of preseptal cellulitis.Vincent Ochoa Unfortunately no culture was sent from incision and drainage in the emergency department. 2. Thrombocytopenia: Stable. This was noted in 2012 as well. Needs outpatient evaluation. Possibly related to fatty liver. 3. Elevated AST: Noted 2012, fatty liver disease was suspected at that time. Consider outpatient evaluation and hepatitis panel if not previously done. Defer to outpatient setting. Not on statin. 4. GERD: Stable. Continue PPI. 5. Coronary artery disease, history of stent placement: Stable. Continue aspirin. 6. Subcutaneous right breast nodule: Multiple, favor lipoma. Followup as an outpatient.   Code Status: Full code Family Communication: None present Disposition Plan: Home today.  [name], MD  [facility] Team 4  Pager [phone] If 7PM-7AM, please contact night-coverage at [email or web address], password [facility] 11/30/2012, 9:41 AM  LOS: 2 days   Brief narrative: 3 days ago he went to his dermatologist Joyice Faster [phone]) for facial wart removal. She removed a wart from the cheek ajecent to the right side of the nose. At the same time she noted a small abscess next to the conner of his right eye. She prescribed him bactrim and was planning to follow up. After 3 days patient developed significant swelling and was not able to open his Right eye. He presented to [facility] ED and had an abscess drained. CT scan showed periorbital cellulitis with out evidence of abscess. He was started on clindamycin.    He was transferred to [facility] for IV antibiotic management.  Of note about 2 weeks ago he was told that routine testing showed low platelate he was supposed to be seen by his PCP but never made it to the appointment. Denies any easy bleeding or bruising no rashes.   Procedures:  1/8: Incision/drainage face/nasal bridge in the emergency department. No culture sent.  Antibiotics:  Clindamycin 1/9 >> 1/18  HPI/Subjective: Afebrile, vital signs stable. Feeling much better. No eye pain, no double vision, no difficulty moving. Vision intact. No periorbital pain. Very minimal pain nasal bridge. Wants to go home.   Objective: Filed Vitals:   11/29/12 1807 11/29/12 2116 11/30/12 0200 11/30/12 0550  BP: 119/60 124/62 129/70 121/74  Pulse: 82 81 79 67  Temp: 98 F (36.7 C) 98.4 F (36.9 C) 98 F (36.7 C) 97.5 F (36.4 C)  TempSrc: Oral Oral Oral Oral  Resp: 20 18 18 18   Height:      Weight:      SpO2: 95% 93% 96% 95%    Intake/Output Summary (Last 24 hours) at 11/30/12 0941 Last data filed at 11/30/12 0200  Gross per 24 hour  Intake   1080 ml  Output   3800 ml  Net  -2720 ml   Filed Weights   11/28/12 1707  Weight: 127.007 kg (280 lb)    Exam:  General:  Appears calm and comfortable Eyes: Left eye, eyelid appears unremarkable. Pupils equal, round. Right upper and lower eyelids less edematous today, upper greater than lower. Near resolution of erythema of the upper and lower periorbital area. No fluctuance, minimal tenderness to palpation. The medial superior aspect of the bony orbit/bridge of nose has no significant tenderness to palpation,  no fluctuance in the area of incision and drainage, there is subcutaneous edema. Extraocular movements are intact bilaterally.  ENT: Nose appears otherwise unremarkable. Face otherwise unremarkable. Area of previous wart removal healing well. Cardiovascular: RRR, no m/r/g. No LE edema. Respiratory: CTA bilaterally, no w/r/r. Normal respiratory  effort. Skin: As above Psychiatric: grossly normal mood and affect, speech fluent and appropriate  Data Reviewed: Basic Metabolic Panel:  Lab 11/29/12 1610 11/28/12 1730  NA 138 138  K 4.0 4.3  CL 100 102  CO2 27 25  GLUCOSE 111* 90  BUN 13 13  CREATININE 1.05 1.00  CALCIUM 9.1 9.4  MG 1.9 --  PHOS 3.6 --   Liver Function Tests:  Lab 11/29/12 0505  AST 48*  ALT 53  ALKPHOS 78  BILITOT 0.4  PROT 7.3  ALBUMIN 3.5   CBC:  Lab 11/29/12 0505 11/28/12 1730  WBC 7.9 8.0  NEUTROABS 4.0 4.5  HGB 14.6 16.0  HCT 41.8 45.4  MCV 90.9 89.9  PLT 69* 74*   Studies: Ct Orbits W/cm  11/28/2012  *RADIOLOGY REPORT*  Clinical Data: Wart  removed from face.  Swelling and pain.  CT ORBITS WITH CONTRAST  Technique:  Multidetector CT imaging of the orbits was performed following the bolus administration of intravenous contrast.  Contrast: 75mL OMNIPAQUE IOHEXOL 300 MG/ML  SOLN  Comparison: None.  Findings:  There is mild preseptal periorbital soft tissue swelling on the right.  There is no post-septal inflammation.  The globes are intact.  The left orbit is negative.  There is no paranasal sinus disease.  Bony confines of the face and orbit are unremarkable.  Visualized intracranial compartment is negative.  IMPRESSION: Right preseptal periorbital soft tissue swelling consistent with cellulitis.  No post-septal inflammation.  No visible abscess.   Original Report Authenticated By: Davonna Belling, M.D.     Scheduled Meds:    . clindamycin (CLEOCIN) IV  900 mg Intravenous Q6H  . docusate sodium  100 mg Oral BID  . sodium chloride  3 mL Intravenous Q12H   Continuous Infusions:   Principal Problem:  *Periorbital cellulitis Active Problems:  CAD (coronary artery disease)  Abscess of face  Thrombocytopenia  Elevated AST (SGOT)     [name], MD  [facility] Team 4  Pager [phone] If 7PM-7AM, please contact night-coverage at [email or web address], password [facility] 11/30/2012, 9:41 AM   LOS: 2 days

## 2012-11-30 NOTE — Discharge Summary (Signed)
Physician Discharge Summary  Vincent Ochoa ZOX:[id number] DOB: 1956-04-04 DOA: 11/28/2012  PCP: Letitia Libra, Ala Dach, MD Dermatologist: Joyice Faster [phone])  Admit date: 11/28/2012 Discharge date: 11/30/2012  Recommendations for Outpatient Follow-up:  1. Followup resolution of preseptal cellulitis right eye 2. Consider outpatient evaluation for thrombocytopenia, chronic 3. Consider evaluation of elevated transaminase as clinically indicated 4. Consider further evaluation subcutaneous right breast nodule  Follow-up Information    Follow up with Letitia Libra, Ala Dach, MD. Schedule an appointment as soon as possible for a visit in 1 week.   Contact information:   [address] [place] [address] [phone]         Discharge Diagnoses:  1. Preseptal cellulitis right eye, abscess of nasal bridge 2. Thrombocytopenia 3. Elevated AST 4. Subcutaneous right breast nodule  Discharge Condition: Improved Disposition: Home  Diet recommendation: Regular  Filed Weights   11/28/12 1707  Weight: 127.007 kg (280 lb)    History of present illness:  57 year old man presented with right facial cellulitis, nasal bridge abscess and preseptal cellulitis on the right. 3 days prior to admission went to his dermatologist Joyice Faster [phone]) for facial wart removal. She removed a wart from the cheek adjacent to the right side of the nose. At the same time she noted a small abscess next to the conner of his right eye. She prescribed him bactrim and was planning to follow up. After 3 days patient developed significant swelling and was not able to open his Right eye. He presented to [facility] ED and had an abscess drained. CT scan showed periorbital cellulitis with out evidence of abscess. He was started on clindamycin.    Hospital Course:  [name] was treated empirically with IV clindamycin. He had rapid improvement of his cellulitis with resolution of pain, near resolution  of erythema. Orbit remains uninvolved and both by clinical history, exam and imaging there is no evidence to suggest orbital cellulitis. Area of abscess on the nasal bridge continues to improve with minimal spontaneous drainage. Abscess appears resolved, subcutaneous edema persists. Given rapid improvement in clinical stability plan discharge home.  1. Preseptal cellulitis right eye, abscess of nasal bridge: Rapidly improved on IV clindamycin. Complete oral clindamycin with followup next week. No signs or symptoms to suggest orbital cellulitis and imaging was consistent with clinical impression of preseptal cellulitis. Unfortunately no culture was sent from incision and drainage in the emergency department.  2. Thrombocytopenia: Stable. This was noted in 2012 as well. Needs outpatient evaluation. Possibly related to fatty liver.  3. Elevated AST: Noted 2012, fatty liver disease was suspected at that time. Consider outpatient evaluation and hepatitis panel if not previously done. Defer to outpatient setting. Not on statin.  4. GERD: Stable. Continue PPI.  5. Coronary artery disease, history of stent placement: Stable. Continue aspirin.  6. Subcutaneous right breast nodule: [place], favor lipoma. Followup as an outpatient.  Procedures:  1/8: Incision/drainage face/nasal bridge in the emergency department. No culture sent.  Antibiotics:  Clindamycin 1/9 >> 1/18  Discharge Instructions  Discharge Orders    Future Orders Please Complete By Expires   Diet general      Discharge instructions      Comments:   Be sure to finish antibiotic (clindamycin) for this infection. Call physician or seek immediate medical attention for increased pain, fever, redness, visual changes or pain or worsening condition.   Activity as tolerated - No restrictions          Medication List  As of 11/30/2012 10:13 AM    STOP taking these medications         sulfamethoxazole-trimethoprim 800-160 MG per tablet    Commonly known as: BACTRIM DS      TAKE these medications         clindamycin 300 MG capsule   Commonly known as: CLEOCIN   Take 1 capsule (300 mg total) by mouth every 8 (eight) hours. First dose January 10 at 2 PM.         The results of significant diagnostics from this hospitalization (including imaging, microbiology, ancillary and laboratory) are listed below for reference.    Significant Diagnostic Studies: Ct Orbits W/cm  11/28/2012  *RADIOLOGY REPORT*  Clinical Data: Wart  removed from face.  Swelling and pain.  CT ORBITS WITH CONTRAST  Technique:  Multidetector CT imaging of the orbits was performed following the bolus administration of intravenous contrast.  Contrast: 75mL OMNIPAQUE IOHEXOL 300 MG/ML  SOLN  Comparison: None.  Findings:  There is mild preseptal periorbital soft tissue swelling on the right.  There is no post-septal inflammation.  The globes are intact.  The left orbit is negative.  There is no paranasal sinus disease.  Bony confines of the face and orbit are unremarkable.  Visualized intracranial compartment is negative.  IMPRESSION: Right preseptal periorbital soft tissue swelling consistent with cellulitis.  No post-septal inflammation.  No visible abscess.   Original Report Authenticated By: Davonna Belling, M.D.       Labs: Basic Metabolic Panel:  Lab 11/29/12 9604 11/28/12 1730  NA 138 138  K 4.0 4.3  CL 100 102  CO2 27 25  GLUCOSE 111* 90  BUN 13 13  CREATININE 1.05 1.00  CALCIUM 9.1 9.4  MG 1.9 --  PHOS 3.6 --   Liver Function Tests:  Lab 11/29/12 0505  AST 48*  ALT 53  ALKPHOS 78  BILITOT 0.4  PROT 7.3  ALBUMIN 3.5   CBC:  Lab 11/29/12 0505 11/28/12 1730  WBC 7.9 8.0  NEUTROABS 4.0 4.5  HGB 14.6 16.0  HCT 41.8 45.4  MCV 90.9 89.9  PLT 69* 74*    Principal Problem:  *Periorbital cellulitis Active Problems:  CAD (coronary artery disease)  Abscess of face  Thrombocytopenia  Elevated AST (SGOT)   Time coordinating discharge:  25 minutes  Signed:  [name], MD [facility] 11/30/2012, 10:13 AM

## 2013-01-01 ENCOUNTER — Encounter: Payer: Self-pay | Admitting: Family Medicine

## 2013-01-01 ENCOUNTER — Telehealth: Payer: Self-pay

## 2013-01-01 ENCOUNTER — Ambulatory Visit (INDEPENDENT_AMBULATORY_CARE_PROVIDER_SITE_OTHER): Payer: BC Managed Care – PPO | Admitting: Family Medicine

## 2013-01-01 VITALS — BP 140/80 | HR 80 | Temp 98.0°F | Ht 75.0 in | Wt 296.0 lb

## 2013-01-01 DIAGNOSIS — M549 Dorsalgia, unspecified: Secondary | ICD-10-CM

## 2013-01-01 DIAGNOSIS — K219 Gastro-esophageal reflux disease without esophagitis: Secondary | ICD-10-CM

## 2013-01-01 DIAGNOSIS — H00039 Abscess of eyelid unspecified eye, unspecified eyelid: Secondary | ICD-10-CM

## 2013-01-01 DIAGNOSIS — M545 Low back pain, unspecified: Secondary | ICD-10-CM

## 2013-01-01 DIAGNOSIS — L03213 Periorbital cellulitis: Secondary | ICD-10-CM

## 2013-01-01 DIAGNOSIS — M533 Sacrococcygeal disorders, not elsewhere classified: Secondary | ICD-10-CM

## 2013-01-01 MED ORDER — RANITIDINE HCL 300 MG PO CAPS: 300.0000 mg | ORAL_CAPSULE | Freq: Every evening | ORAL | Status: DC

## 2013-01-01 MED ORDER — PREDNISONE 20 MG PO TABS: ORAL_TABLET | ORAL | Status: DC

## 2013-01-01 MED ORDER — BACLOFEN 10 MG PO TABS: 10.0000 mg | ORAL_TABLET | Freq: Three times a day (TID) | ORAL | Status: DC

## 2013-01-01 MED ORDER — HYDROCODONE-ACETAMINOPHEN 5-325 MG PO TABS: 1.0000 | ORAL_TABLET | Freq: Four times a day (QID) | ORAL | Status: DC | PRN

## 2013-01-01 MED ORDER — PREDNISONE 5 MG PO TABS: ORAL_TABLET | ORAL | Status: DC

## 2013-01-01 NOTE — Telephone Encounter (Signed)
Pt told Judeth Cornfield that Zantac needs to be called into the pharmacy

## 2013-01-01 NOTE — Patient Instructions (Addendum)
Back Pain, Adult Low back pain is very common. About 1 in 5 people have back pain.The cause of low back pain is rarely dangerous. The pain often gets better over time.About half of people with a sudden onset of back pain feel better in just 2 weeks. About 8 in 10 people feel better by 6 weeks.  CAUSES Some common causes of back pain include:  Strain of the muscles or ligaments supporting the spine.  Wear and tear (degeneration) of the spinal discs.  Arthritis.  Direct injury to the back. DIAGNOSIS Most of the time, the direct cause of low back pain is not known.However, back pain can be treated effectively even when the exact cause of the pain is unknown.Answering your caregiver's questions about your overall health and symptoms is one of the most accurate ways to make sure the cause of your pain is not dangerous. If your caregiver needs more information, he or she may order lab work or imaging tests (X-rays or MRIs).However, even if imaging tests show changes in your back, this usually does not require surgery. HOME CARE INSTRUCTIONS For many people, back pain returns.Since low back pain is rarely dangerous, it is often a condition that people can learn to manageon their own.   Remain active. It is stressful on the back to sit or stand in one place. Do not sit, drive, or stand in one place for more than 30 minutes at a time. Take short walks on level surfaces as soon as pain allows.Try to increase the length of time you walk each day.  Do not stay in bed.Resting more than 1 or 2 days can delay your recovery.  Do not avoid exercise or work.Your body is made to move.It is not dangerous to be active, even though your back may hurt.Your back will likely heal faster if you return to being active before your pain is gone.  Pay attention to your body when you bend and lift. Many people have less discomfortwhen lifting if they bend their knees, keep the load close to their bodies,and  avoid twisting. Often, the most comfortable positions are those that put less stress on your recovering back.  Find a comfortable position to sleep. Use a firm mattress and lie on your side with your knees slightly bent. If you lie on your back, put a pillow under your knees.  Only take over-the-counter or prescription medicines as directed by your caregiver. Over-the-counter medicines to reduce pain and inflammation are often the most helpful.Your caregiver may prescribe muscle relaxant drugs.These medicines help dull your pain so you can more quickly return to your normal activities and healthy exercise.  Put ice on the injured area.  Put ice in a plastic bag.  Place a towel between your skin and the bag.  Leave the ice on for 15 to 20 minutes, 3 to 4 times a day for the first 2 to 3 days. After that, ice and heat may be alternated to reduce pain and spasms.  Ask your caregiver about trying back exercises and gentle massage. This may be of some benefit.  Avoid feeling anxious or stressed.Stress increases muscle tension and can worsen back pain.It is important to recognize when you are anxious or stressed and learn ways to manage it.Exercise is a great option. SEEK MEDICAL CARE IF:  You have pain that is not relieved with rest or medicine.  You have pain that does not improve in 1 week.  You have new symptoms.  You are generally   not feeling well. SEEK IMMEDIATE MEDICAL CARE IF:   You have pain that radiates from your back into your legs.  You develop new bowel or bladder control problems.  You have unusual weakness or numbness in your arms or legs.  You develop nausea or vomiting.  You develop abdominal pain.  You feel faint. Document Released: 11/07/2005 Document Revised: 05/08/2012 Document Reviewed: 03/28/2011 Exit[employer] Patient Information 2013 [employer].  

## 2013-01-04 ENCOUNTER — Encounter: Payer: Self-pay | Admitting: Family Medicine

## 2013-01-04 DIAGNOSIS — M545 Low back pain: Secondary | ICD-10-CM | POA: Insufficient documentation

## 2013-01-04 NOTE — Assessment & Plan Note (Signed)
Flared again recently, avoid offending foods, start a probiotic, given an rx for Rantitdine qhs

## 2013-01-04 NOTE — Assessment & Plan Note (Signed)
Resolved, no trouble since completing antibiotics

## 2013-01-04 NOTE — Assessment & Plan Note (Signed)
Injured his back while lifting a very heavy, rpeortedly 600 # desk last week. Reports 8-10 level pain since then with some intermittent incontinence. Started on Steroids and avoid heavy lifting and strenuous activity, referred to ortho for further evaluation, also given muscle relaxer and hydrocodone to use prn

## 2013-01-04 NOTE — Progress Notes (Signed)
Patient ID: MARISA HUFSTETLER, male   DOB: [birth year], 57 y.o.   MRN: [id number] KAINALU HEGGS [id number] 25-May-1956 01/04/2013      Progress Note-Follow Up  Subjective  Chief Complaint  Chief Complaint  Patient presents with  . Follow-up    hospital    HPI  Patient is a 57 year old male who is here today for hospital followup. He was struggling with the periorbital cellulitis. Reports this is completely resolved he doesn't have any residual pain or swelling. His biggest complaint is of back pain. He says he was lifting a desk he estimates that 6 or pounds last week when he felt a pop in his low back has had pain ever sense. He ranks the pain as an 8-10 out of 10. Has difficulty finding a comfortable position has not been sleeping well. Notes he feels excessively fatigued and feels weakness in his legs also has had some urinary urgency and occasional incontinence since his episode. No chest pain, palpitations, shortness of breath, calf pain.    Past Surgical History  Procedure Laterality Date  . Tonsillectomy  1960's  . Cholecystectomy  2007  . Knee arthroplasty  1970's    "both; I used to rodeo" (11/28/2012)  . Coronary angioplasty with stent placement  ~ 2006    "2" (11/28/2012)  . Coronary angioplasty  2009  . Facial cosmetic surgery  11/25/2012    "wart removed under right eye; presents to hospital w/right eye swelling 11/28/2012)    Family History  Problem Relation Age of Onset  . Arthritis Maternal Grandmother   . Heart disease Sister   . Diabetes Sister   . Cancer - Ovarian Sister   . Heart disease      maternal/paternal grandparents  . Cirrhosis Mother     deceased age 33    History   Social History  . Marital Status: Married    Spouse Name: N/A    Number of Children: N/A  . Years of Education: N/A   Occupational History  . Not on file.   Social History Main Topics  . Smoking status: Current Every Day Smoker -- 1.50 packs/day for 43 years    Types: Cigarettes   Last Attempt to Quit: 08/01/2011  . Smokeless tobacco: Current User     Comment: 11/28/2012 "chew occasionally"  . Alcohol Use: Yes     Comment: 11/28/2012 "beer q 6 months or so"  . Drug Use: No  . Sexually Active: No   Other Topics Concern  . Not on file   Social History Narrative  . No narrative on file    No current outpatient prescriptions on file prior to visit.   No current facility-administered medications on file prior to visit.    No Known Allergies  Review of Systems  Review of Systems  Constitutional: Negative for fever and malaise/fatigue.  HENT: Negative for congestion.   Eyes: Negative for discharge.  Respiratory: Negative for shortness of breath.   Cardiovascular: Negative for chest pain, palpitations and leg swelling.  Gastrointestinal: Negative for nausea, abdominal pain and diarrhea.  Genitourinary: Negative for dysuria.  Musculoskeletal: Negative for falls.  Skin: Negative for rash.  Neurological: Negative for loss of consciousness and headaches.  Endo/Heme/Allergies: Negative for polydipsia.  Psychiatric/Behavioral: Negative for depression and suicidal ideas. The patient is not nervous/anxious and does not have insomnia.     Objective  BP 140/80  Pulse 80  Temp(Src) 98 F (36.7 C) (Oral)  Ht 6\' 3"  (1.905 m)  Wt  296 lb (134.265 kg)  BMI 37 kg/m2  SpO2 95%  Physical Exam  Physical Exam  Constitutional: He is oriented to person, place, and time and well-developed, well-nourished, and in no distress. No distress.  HENT:  Head: Normocephalic and atraumatic.  Eyes: Conjunctivae are normal.  Neck: Neck supple. No thyromegaly present.  Cardiovascular: Normal rate, regular rhythm and normal heart sounds.   No murmur heard. Pulmonary/Chest: Effort normal and breath sounds normal. No respiratory distress.  Abdominal: He exhibits no distension and no mass. There is no tenderness.  Musculoskeletal: He exhibits no edema.  Neurological: He is alert and  oriented to person, place, and time.  Skin: Skin is warm.  Psychiatric: Memory, affect and judgment normal.    Lab Results  Component Value Date   TSH 3.911 11/29/2012   Lab Results  Component Value Date   WBC 7.9 11/29/2012   HGB 14.6 11/29/2012   HCT 41.8 11/29/2012   MCV 90.9 11/29/2012   PLT 69* 11/29/2012   Lab Results  Component Value Date   CREATININE 1.05 11/29/2012   BUN 13 11/29/2012   NA 138 11/29/2012   K 4.0 11/29/2012   CL 100 11/29/2012   CO2 27 11/29/2012   Lab Results  Component Value Date   ALT 53 11/29/2012   AST 48* 11/29/2012   ALKPHOS 78 11/29/2012   BILITOT 0.4 11/29/2012   Lab Results  Component Value Date   CHOL 209* 08/15/2011   Lab Results  Component Value Date   HDL 28* 08/15/2011   Lab Results  Component Value Date   LDLCALC 148* 08/15/2011   Lab Results  Component Value Date   TRIG 164* 08/15/2011   Lab Results  Component Value Date   CHOLHDL 7.5 08/15/2011     Assessment & Plan  Periorbital cellulitis Resolved, no trouble since completing antibiotics  GERD (gastroesophageal reflux disease) Flared again recently, avoid offending foods, start a probiotic, given an rx for Rantitdine qhs  Back pain, lumbosacral Injured his back while lifting a very heavy, rpeortedly 600 # desk last week. Reports 8-10 level pain since then with some intermittent incontinence. Started on Steroids and avoid heavy lifting and strenuous activity, referred to ortho for further evaluation, also given muscle relaxer and hydrocodone to use prn

## 2013-01-24 ENCOUNTER — Ambulatory Visit (HOSPITAL_BASED_OUTPATIENT_CLINIC_OR_DEPARTMENT_OTHER)
Admission: RE | Admit: 2013-01-24 | Discharge: 2013-01-24 | Disposition: A | Discharge status: Home / Self Care | Payer: BC Managed Care – PPO | Source: Ambulatory Visit | Attending: Family Medicine | Admitting: Family Medicine

## 2013-01-24 DIAGNOSIS — M549 Dorsalgia, unspecified: Secondary | ICD-10-CM | POA: Insufficient documentation

## 2013-02-11 ENCOUNTER — Emergency Department (HOSPITAL_BASED_OUTPATIENT_CLINIC_OR_DEPARTMENT_OTHER): Payer: BC Managed Care – PPO

## 2013-02-11 ENCOUNTER — Emergency Department (HOSPITAL_BASED_OUTPATIENT_CLINIC_OR_DEPARTMENT_OTHER)
Admission: EM | Admit: 2013-02-11 | Discharge: 2013-02-11 | Discharge status: Against Medical Advice | Payer: BC Managed Care – PPO | Attending: Emergency Medicine | Admitting: Emergency Medicine

## 2013-02-11 ENCOUNTER — Encounter (HOSPITAL_BASED_OUTPATIENT_CLINIC_OR_DEPARTMENT_OTHER): Payer: Self-pay | Admitting: *Deleted

## 2013-02-11 DIAGNOSIS — Y9389 Activity, other specified: Secondary | ICD-10-CM | POA: Insufficient documentation

## 2013-02-11 DIAGNOSIS — Z79899 Other long term (current) drug therapy: Secondary | ICD-10-CM | POA: Insufficient documentation

## 2013-02-11 DIAGNOSIS — S59909A Unspecified injury of unspecified elbow, initial encounter: Secondary | ICD-10-CM | POA: Insufficient documentation

## 2013-02-11 DIAGNOSIS — F17200 Nicotine dependence, unspecified, uncomplicated: Secondary | ICD-10-CM | POA: Insufficient documentation

## 2013-02-11 DIAGNOSIS — S8990XA Unspecified injury of unspecified lower leg, initial encounter: Secondary | ICD-10-CM | POA: Insufficient documentation

## 2013-02-11 DIAGNOSIS — W1789XA Other fall from one level to another, initial encounter: Secondary | ICD-10-CM | POA: Insufficient documentation

## 2013-02-11 DIAGNOSIS — S99929A Unspecified injury of unspecified foot, initial encounter: Secondary | ICD-10-CM | POA: Insufficient documentation

## 2013-02-11 DIAGNOSIS — S6990XA Unspecified injury of unspecified wrist, hand and finger(s), initial encounter: Secondary | ICD-10-CM | POA: Insufficient documentation

## 2013-02-11 DIAGNOSIS — Y929 Unspecified place or not applicable: Secondary | ICD-10-CM | POA: Insufficient documentation

## 2013-02-11 DIAGNOSIS — I2510 Atherosclerotic heart disease of native coronary artery without angina pectoris: Secondary | ICD-10-CM | POA: Insufficient documentation

## 2013-02-11 NOTE — ED Notes (Signed)
Pt fell off ladder around 11am and now c/o pain to right elbow, right lower leg, and back. Pt is able to ambulate, denies LOC.

## 2013-02-18 ENCOUNTER — Encounter: Payer: Self-pay | Admitting: Family Medicine

## 2013-02-18 ENCOUNTER — Ambulatory Visit (INDEPENDENT_AMBULATORY_CARE_PROVIDER_SITE_OTHER): Payer: BC Managed Care – PPO | Admitting: Family Medicine

## 2013-02-18 ENCOUNTER — Ambulatory Visit (HOSPITAL_BASED_OUTPATIENT_CLINIC_OR_DEPARTMENT_OTHER)
Admission: RE | Admit: 2013-02-18 | Discharge: 2013-02-18 | Disposition: A | Discharge status: Home / Self Care | Payer: BC Managed Care – PPO | Source: Ambulatory Visit | Attending: Family Medicine | Admitting: Family Medicine

## 2013-02-18 VITALS — BP 150/80 | HR 76 | Temp 98.0°F | Ht 75.0 in | Wt 291.4 lb

## 2013-02-18 DIAGNOSIS — L03115 Cellulitis of right lower limb: Secondary | ICD-10-CM | POA: Insufficient documentation

## 2013-02-18 DIAGNOSIS — R609 Edema, unspecified: Secondary | ICD-10-CM

## 2013-02-18 DIAGNOSIS — W11XXXA Fall on and from ladder, initial encounter: Secondary | ICD-10-CM | POA: Insufficient documentation

## 2013-02-18 DIAGNOSIS — R600 Localized edema: Secondary | ICD-10-CM

## 2013-02-18 DIAGNOSIS — L02419 Cutaneous abscess of limb, unspecified: Secondary | ICD-10-CM

## 2013-02-18 DIAGNOSIS — S40021A Contusion of right upper arm, initial encounter: Secondary | ICD-10-CM

## 2013-02-18 DIAGNOSIS — S40029A Contusion of unspecified upper arm, initial encounter: Secondary | ICD-10-CM | POA: Insufficient documentation

## 2013-02-18 DIAGNOSIS — M7989 Other specified soft tissue disorders: Secondary | ICD-10-CM | POA: Insufficient documentation

## 2013-02-18 MED ORDER — TRAMADOL HCL 50 MG PO TABS: 50.0000 mg | ORAL_TABLET | Freq: Three times a day (TID) | ORAL | Status: DC | PRN

## 2013-02-18 MED ORDER — CLINDAMYCIN HCL 300 MG PO CAPS: 300.0000 mg | ORAL_CAPSULE | Freq: Three times a day (TID) | ORAL | Status: DC

## 2013-02-18 NOTE — Progress Notes (Signed)
  Subjective:    Patient ID: Vincent Ochoa, male    DOB: 1956/04/06, 57 y.o.   MRN: [id number]  HPI Fall- fell off ladder after ladder slipped off wet porch and pt's leg was caught.  Injury occurred 3/24.  Went to ER and had xrays but didn't see MD.  Pt reports 'a streak of pain' up anterior shin.  Had xray of R elbow, R lower leg, lumbar spine- all negative for bony injury.  Pt started taking left over Clindamycin BID x3 days due to redness and swelling of R lower leg.  Has been taking ibuprofen w/out relief.  Has large painful bruise on R forearm, elbow.   Review of Systems For ROS see HPI     Objective:   Physical Exam  Vitals reviewed. Constitutional: He appears well-developed and well-nourished. No distress.  Musculoskeletal: He exhibits edema (of R lower leg circumferentially) and tenderness (over R calf and anterior shin, no TTP over knee or ankle).  Large, tender hematoma of R forearm/elbow w/out bony tenderness  Skin: Skin is warm and dry. There is erythema (pt w/ 2 scabbed areas on R anterior lower leg w/ surrounding erythema and warmth).          Assessment & Plan:

## 2013-02-18 NOTE — Assessment & Plan Note (Signed)
New.  Tramadol/tylenol prn.  Heat to promote resorption.  Reviewed supportive care and red flags that should prompt return.  Pt expressed understanding and is in agreement w/ plan.

## 2013-02-18 NOTE — Patient Instructions (Addendum)
We'll notify you of your ultrasound results HEAT the large bruise on your arm ICE and elevate your leg Take the Clindamycin 3x/day for 7 days- take w/ food Hydrogen peroxide to leg to keep it clean Use the Tramadol for pain- add tylenol as needed Hang in there!!!

## 2013-02-18 NOTE — Assessment & Plan Note (Signed)
New.  Pt already taking left over Clindamycin.  Continue course to completion to tx current infxn.

## 2013-02-18 NOTE — Assessment & Plan Note (Signed)
New.  Reviewed xrays from ER.  No obvious bony injuries.  Reviewed that soft tissue injuries can be just as painful and take as long- if not longer- to heal.  Tramadol prn.  If no improvement will need ortho referral.  Pt expressed understanding and is in agreement w/ plan.

## 2013-02-18 NOTE — Assessment & Plan Note (Signed)
New.  Reviewed xrays from ER.  No bony injury.  Suspect swelling is all due to bruising and soft tissue injury but must r/o DVT.  Get [place].  Tramadol prn.  Ice, elevate.

## 2013-02-21 ENCOUNTER — Ambulatory Visit (INDEPENDENT_AMBULATORY_CARE_PROVIDER_SITE_OTHER): Payer: BC Managed Care – PPO | Admitting: Family Medicine

## 2013-02-21 ENCOUNTER — Encounter: Payer: Self-pay | Admitting: Family Medicine

## 2013-02-21 VITALS — BP 120/74 | HR 73 | Temp 98.1°F | Ht 75.0 in | Wt 289.0 lb

## 2013-02-21 DIAGNOSIS — Z23 Encounter for immunization: Secondary | ICD-10-CM

## 2013-02-21 DIAGNOSIS — L03115 Cellulitis of right lower limb: Secondary | ICD-10-CM

## 2013-02-21 DIAGNOSIS — L089 Local infection of the skin and subcutaneous tissue, unspecified: Secondary | ICD-10-CM

## 2013-02-21 DIAGNOSIS — M549 Dorsalgia, unspecified: Secondary | ICD-10-CM

## 2013-02-21 DIAGNOSIS — L03119 Cellulitis of unspecified part of limb: Secondary | ICD-10-CM

## 2013-02-21 MED ORDER — CLINDAMYCIN HCL 300 MG PO CAPS: 300.0000 mg | ORAL_CAPSULE | Freq: Three times a day (TID) | ORAL | Status: DC

## 2013-02-21 MED ORDER — HYDROCODONE-ACETAMINOPHEN 5-325 MG PO TABS: ORAL_TABLET | ORAL | Status: DC

## 2013-02-21 NOTE — Progress Notes (Signed)
  Subjective:    Patient ID: Vincent Ochoa, male    DOB: [birth year], 57 y.o.   MRN: [id number]  HPI Cellulitis- pt seen on Monday 3/31 and started on Clinda.  Pt has been taking BID rather than TID.  Pt reports leg is more sore than previous.  Feels redness has increased.  No fevers.   Review of Systems For ROS see HPI     Objective:   Physical Exam  Vitals reviewed. Skin: Skin is warm and dry. There is erythema (R lower leg erythema medially w/ black scab centrally.  wound debrided and unidentified foreign material removed.  pt reports pain improved immediately).          Assessment & Plan:

## 2013-02-21 NOTE — Patient Instructions (Addendum)
Follow up Monday at 4 Increase the Clinda to 3x/day for at least another week Use the vicodin as needed for pain Keep area clean and dry If spreading redness or worsening pain- go to Urgent Care Call with any questions or concerns Hang in there!

## 2013-02-22 ENCOUNTER — Telehealth: Payer: Self-pay | Admitting: *Deleted

## 2013-02-22 ENCOUNTER — Encounter: Payer: Self-pay | Admitting: Lab

## 2013-02-22 NOTE — Telephone Encounter (Signed)
Attempted to contact pt, lm to return my call.

## 2013-02-24 NOTE — Assessment & Plan Note (Signed)
Unchanged.  Increase duration of Clinda due to presence of foreign body.  Pt expressed understanding and is in agreement w/ plan.

## 2013-02-24 NOTE — Assessment & Plan Note (Signed)
New.  Area was cleaned w/ H2O2 and debrided w/ forceps, scissors.  Once foreign body was removed and underlying tissue identified, area again cleaned w/ H2O2 and dressed w/ triple abx ointment.  Pt tolerated procedure w/out difficulty.  Extend Clinda for another week and reviewed proper dosing.  Reviewed supportive care and red flags that should prompt return.  Pt expressed understanding and is in agreement w/ plan.  Will follow closely.

## 2013-02-25 ENCOUNTER — Ambulatory Visit: Payer: BC Managed Care – PPO | Admitting: Family Medicine

## 2013-02-25 ENCOUNTER — Ambulatory Visit (INDEPENDENT_AMBULATORY_CARE_PROVIDER_SITE_OTHER): Payer: BC Managed Care – PPO | Admitting: Family Medicine

## 2013-02-25 ENCOUNTER — Encounter: Payer: Self-pay | Admitting: Family Medicine

## 2013-02-25 VITALS — BP 120/60 | HR 82 | Temp 98.1°F | Ht 75.0 in | Wt 294.4 lb

## 2013-02-25 DIAGNOSIS — M25571 Pain in right ankle and joints of right foot: Secondary | ICD-10-CM | POA: Insufficient documentation

## 2013-02-25 DIAGNOSIS — Z5189 Encounter for other specified aftercare: Secondary | ICD-10-CM

## 2013-02-25 DIAGNOSIS — L089 Local infection of the skin and subcutaneous tissue, unspecified: Secondary | ICD-10-CM

## 2013-02-25 DIAGNOSIS — M533 Sacrococcygeal disorders, not elsewhere classified: Secondary | ICD-10-CM

## 2013-02-25 DIAGNOSIS — M545 Low back pain: Secondary | ICD-10-CM

## 2013-02-25 DIAGNOSIS — M25579 Pain in unspecified ankle and joints of unspecified foot: Secondary | ICD-10-CM

## 2013-02-25 MED ORDER — NAPROXEN 500 MG PO TABS: 500.0000 mg | ORAL_TABLET | Freq: Two times a day (BID) | ORAL | Status: AC

## 2013-02-25 NOTE — Patient Instructions (Addendum)
We'll call you with your neurosurgery appt Wear the ankle brace Start the Naproxen twice daily- take w/ food.  If no better in 7-10 days, please call so we can get you to see [facility] notify you of your lab results (looking for gout) Continue the Clinda until it's gone Continue to keep area clean and dry Call with any questions or concerns Happy [place] Ouch!

## 2013-02-26 NOTE — Assessment & Plan Note (Signed)
Improving.  No evidence of current infxn.

## 2013-02-26 NOTE — Assessment & Plan Note (Signed)
New to provider, ongoing for pt.  Will get record release for MRI and refer to neurosurg for 2nd opinion.  Pt expressed understanding and is in agreement w/ plan.

## 2013-02-26 NOTE — Progress Notes (Signed)
  Subjective:    Patient ID: Vincent Ochoa, male    DOB: 1956-05-16, 57 y.o.   MRN: [id number]  HPI R leg cellulitis- redness is much improved.  Pain is less.  Now taking Clinda TID as directed.  Wound is draining serosanguinous fluid but no purulent drainage.  R ankle swelling- pt had R tib/fib xray at time of injury that showed normal ankle anatomy.  Area very swollen over lateral malleolus and TTP.  No known injury.  Pain and swelling is new.  No hx of gout.  Pt does admit to eating seafood and beef over the weekend.  Lumbar back pain- pt had ortho referral in [place] and was told he would need surgery.  Pt is clearly apprehensive about this.  Would like 2nd opinion before preceding.  Thinks he had MRI done- doesn't have records.   Review of Systems For ROS see HPI     Objective:   Physical Exam  Vitals reviewed. Constitutional: He appears well-developed and well-nourished. No distress.  Musculoskeletal: He exhibits edema (over R lateral malleolus) and tenderness (over R lateral malleolus).  Skin: Skin is warm and dry. There is erythema (much less R lower leg erythema than previously).  Site of foreign body removal clean w/ serosanguinous drainage.  No evidence of purulent drainage or infxn.          Assessment & Plan:

## 2013-02-26 NOTE — Assessment & Plan Note (Signed)
New.  No known injury.  + edema and TTP over lateral malleolus.  Reviewed previous xray that showed no obvious fx.  Start scheduled NSAIDs, ankle brace.  Check UA to r/o gout.  If no improvement will need sports med appt.   Pt expressed understanding and is in agreement w/ plan.

## 2013-02-27 ENCOUNTER — Encounter: Payer: Self-pay | Admitting: *Deleted

## 2013-03-07 ENCOUNTER — Ambulatory Visit: Payer: BC Managed Care – PPO | Admitting: Family Medicine

## 2013-03-21 ENCOUNTER — Encounter: Payer: Self-pay | Admitting: Family Medicine

## 2013-04-10 ENCOUNTER — Other Ambulatory Visit: Payer: Self-pay | Admitting: Neurosurgery

## 2013-04-10 DIAGNOSIS — M549 Dorsalgia, unspecified: Secondary | ICD-10-CM

## 2014-10-28 ENCOUNTER — Encounter (HOSPITAL_BASED_OUTPATIENT_CLINIC_OR_DEPARTMENT_OTHER): Payer: Self-pay

## 2014-10-28 ENCOUNTER — Emergency Department (HOSPITAL_BASED_OUTPATIENT_CLINIC_OR_DEPARTMENT_OTHER): Payer: Medicaid Other

## 2014-10-28 ENCOUNTER — Inpatient Hospital Stay (HOSPITAL_BASED_OUTPATIENT_CLINIC_OR_DEPARTMENT_OTHER)
Admission: EM | Admit: 2014-10-28 | Discharge: 2014-10-31 | DRG: 303 | Disposition: A | Discharge status: Home / Self Care | Payer: Medicaid Other | Attending: Cardiology | Admitting: Cardiology

## 2014-10-28 DIAGNOSIS — I25110 Atherosclerotic heart disease of native coronary artery with unstable angina pectoris: Principal | ICD-10-CM | POA: Diagnosis present

## 2014-10-28 DIAGNOSIS — R51 Headache: Secondary | ICD-10-CM | POA: Diagnosis present

## 2014-10-28 DIAGNOSIS — D638 Anemia in other chronic diseases classified elsewhere: Secondary | ICD-10-CM | POA: Diagnosis present

## 2014-10-28 DIAGNOSIS — Z6837 Body mass index (BMI) 37.0-37.9, adult: Secondary | ICD-10-CM

## 2014-10-28 DIAGNOSIS — R079 Chest pain, unspecified: Secondary | ICD-10-CM | POA: Diagnosis present

## 2014-10-28 DIAGNOSIS — L409 Psoriasis, unspecified: Secondary | ICD-10-CM | POA: Diagnosis present

## 2014-10-28 DIAGNOSIS — F17210 Nicotine dependence, cigarettes, uncomplicated: Secondary | ICD-10-CM | POA: Diagnosis present

## 2014-10-28 DIAGNOSIS — D72819 Decreased white blood cell count, unspecified: Secondary | ICD-10-CM | POA: Diagnosis present

## 2014-10-28 DIAGNOSIS — M545 Low back pain: Secondary | ICD-10-CM | POA: Diagnosis present

## 2014-10-28 DIAGNOSIS — Z8619 Personal history of other infectious and parasitic diseases: Secondary | ICD-10-CM

## 2014-10-28 DIAGNOSIS — D693 Immune thrombocytopenic purpura: Secondary | ICD-10-CM | POA: Diagnosis present

## 2014-10-28 DIAGNOSIS — I200 Unstable angina: Secondary | ICD-10-CM

## 2014-10-28 DIAGNOSIS — E669 Obesity, unspecified: Secondary | ICD-10-CM | POA: Diagnosis present

## 2014-10-28 DIAGNOSIS — D696 Thrombocytopenia, unspecified: Secondary | ICD-10-CM

## 2014-10-28 DIAGNOSIS — I209 Angina pectoris, unspecified: Secondary | ICD-10-CM

## 2014-10-28 DIAGNOSIS — K760 Fatty (change of) liver, not elsewhere classified: Secondary | ICD-10-CM | POA: Diagnosis present

## 2014-10-28 DIAGNOSIS — E785 Hyperlipidemia, unspecified: Secondary | ICD-10-CM | POA: Diagnosis present

## 2014-10-28 DIAGNOSIS — Z955 Presence of coronary angioplasty implant and graft: Secondary | ICD-10-CM

## 2014-10-28 DIAGNOSIS — R7989 Other specified abnormal findings of blood chemistry: Secondary | ICD-10-CM | POA: Diagnosis present

## 2014-10-28 DIAGNOSIS — K219 Gastro-esophageal reflux disease without esophagitis: Secondary | ICD-10-CM | POA: Diagnosis present

## 2014-10-28 DIAGNOSIS — Z7982 Long term (current) use of aspirin: Secondary | ICD-10-CM

## 2014-10-28 DIAGNOSIS — I252 Old myocardial infarction: Secondary | ICD-10-CM

## 2014-10-28 HISTORY — DX: Abnormal results of liver function studies: R94.5

## 2014-10-28 HISTORY — DX: Obesity, unspecified: E66.9

## 2014-10-28 HISTORY — DX: Headache: R51

## 2014-10-28 HISTORY — DX: Other specified abnormal findings of blood chemistry: R79.89

## 2014-10-28 HISTORY — DX: Thrombocytopenia, unspecified: D69.6

## 2014-10-28 HISTORY — DX: Headache, unspecified: R51.9

## 2014-10-28 HISTORY — DX: Fatty (change of) liver, not elsewhere classified: K76.0

## 2014-10-28 HISTORY — DX: Hyperlipidemia, unspecified: E78.5

## 2014-10-28 LAB — COMPREHENSIVE METABOLIC PANEL
ALBUMIN: 3.3 g/dL — AB (ref 3.5–5.2)
ALK PHOS: 81 U/L (ref 39–117)
ALT: 66 U/L — ABNORMAL HIGH (ref 0–53)
AST: 73 U/L — AB (ref 0–37)
Anion gap: 13 (ref 5–15)
BILIRUBIN TOTAL: 1.3 mg/dL — AB (ref 0.3–1.2)
BUN: 9 mg/dL (ref 6–23)
CHLORIDE: 105 meq/L (ref 96–112)
CO2: 25 meq/L (ref 19–32)
Calcium: 9.1 mg/dL (ref 8.4–10.5)
Creatinine, Ser: 0.7 mg/dL (ref 0.50–1.35)
GFR calc Af Amer: >90 mL/min (ref >90)
Glucose, Bld: 96 mg/dL (ref 70–99)
POTASSIUM: 4 meq/L (ref 3.7–5.3)
Sodium: 143 mEq/L (ref 137–147)
Total Protein: 7.2 g/dL (ref 6.0–8.3)

## 2014-10-28 LAB — CBC WITH DIFFERENTIAL/PLATELET
BASOS ABS: 0 10*3/uL (ref 0.0–0.1)
Basophils Relative: 0 % (ref 0–1)
EOS PCT: 4 % (ref 0–5)
Eosinophils Absolute: 0.2 10*3/uL (ref 0.0–0.7)
HCT: 37.5 % — ABNORMAL LOW (ref 39.0–52.0)
Hemoglobin: 12.7 g/dL — ABNORMAL LOW (ref 13.0–17.0)
Lymphocytes Relative: 40 % (ref 12–46)
Lymphs Abs: 1.5 10*3/uL (ref 0.7–4.0)
MCH: 31.5 pg (ref 26.0–34.0)
MCHC: 33.9 g/dL (ref 30.0–36.0)
MCV: 93.1 fL (ref 78.0–100.0)
Monocytes Absolute: 0.4 10*3/uL (ref 0.1–1.0)
Monocytes Relative: 10 % (ref 3–12)
NEUTROS PCT: 45 % (ref 43–77)
Neutro Abs: 1.8 10*3/uL (ref 1.7–7.7)
Platelets: 46 10*3/uL — ABNORMAL LOW (ref 150–400)
RBC: 4.03 MIL/uL — ABNORMAL LOW (ref 4.22–5.81)
RDW: 14.3 % (ref 11.5–15.5)
WBC: 3.9 10*3/uL — ABNORMAL LOW (ref 4.0–10.5)

## 2014-10-28 LAB — CBC
HEMATOCRIT: 37 % — AB (ref 39.0–52.0)
Hemoglobin: 12.4 g/dL — ABNORMAL LOW (ref 13.0–17.0)
MCH: 31.6 pg (ref 26.0–34.0)
MCHC: 33.5 g/dL (ref 30.0–36.0)
MCV: 94.4 fL (ref 78.0–100.0)
PLATELETS: 47 10*3/uL — AB (ref 150–400)
RBC: 3.92 MIL/uL — AB (ref 4.22–5.81)
RDW: 14 % (ref 11.5–15.5)
WBC: 4.2 10*3/uL (ref 4.0–10.5)

## 2014-10-28 LAB — APTT: aPTT: 33 seconds (ref 24–37)

## 2014-10-28 LAB — TROPONIN I: Troponin I: <0.3 ng/mL (ref <0.30)

## 2014-10-28 LAB — PROTIME-INR
INR: 1.29 (ref 0.00–1.49)
PROTHROMBIN TIME: 16.2 s — AB (ref 11.6–15.2)

## 2014-10-28 MED ORDER — ATORVASTATIN CALCIUM 40 MG PO TABS
40.0000 mg | ORAL_TABLET | Freq: Every day | ORAL | Status: DC
  Administered 2014-10-29 – 2014-10-31 (×3): 40 mg via ORAL
  Filled 2014-10-28 (×3): qty 1

## 2014-10-28 MED ORDER — ACETAMINOPHEN 325 MG PO TABS: 650.0000 mg | ORAL_TABLET | ORAL | Status: DC | PRN

## 2014-10-28 MED ORDER — ASPIRIN 81 MG PO CHEW
243.0000 mg | CHEWABLE_TABLET | Freq: Once | ORAL | Status: AC
  Administered 2014-10-28: 243 mg via ORAL
  Filled 2014-10-28: qty 3

## 2014-10-28 MED ORDER — ONDANSETRON HCL 4 MG/2ML IJ SOLN: 4.0000 mg | Freq: Four times a day (QID) | INTRAMUSCULAR | Status: DC | PRN

## 2014-10-28 MED ORDER — SODIUM CHLORIDE 0.9 % IJ SOLN: 3.0000 mL | INTRAMUSCULAR | Status: DC | PRN

## 2014-10-28 MED ORDER — METOPROLOL TARTRATE 12.5 MG HALF TABLET
12.5000 mg | ORAL_TABLET | Freq: Two times a day (BID) | ORAL | Status: DC
  Administered 2014-10-28 – 2014-10-29 (×2): 12.5 mg via ORAL
  Filled 2014-10-28 (×3): qty 1

## 2014-10-28 MED ORDER — SODIUM CHLORIDE 0.9 % IJ SOLN
3.0000 mL | Freq: Two times a day (BID) | INTRAMUSCULAR | Status: DC
  Administered 2014-10-28 – 2014-10-31 (×4): 3 mL via INTRAVENOUS

## 2014-10-28 MED ORDER — ASPIRIN 81 MG PO CHEW
CHEWABLE_TABLET | ORAL | Status: AC
  Filled 2014-10-28: qty 3

## 2014-10-28 MED ORDER — NITROGLYCERIN 0.4 MG SL SUBL
0.4000 mg | SUBLINGUAL_TABLET | SUBLINGUAL | Status: DC | PRN
  Administered 2014-10-30: 0.4 mg via SUBLINGUAL
  Filled 2014-10-28: qty 1

## 2014-10-28 MED ORDER — SODIUM CHLORIDE 0.9 % IV SOLN: 250.0000 mL | INTRAVENOUS | Status: DC | PRN

## 2014-10-28 MED ORDER — ENOXAPARIN SODIUM 150 MG/ML ~~LOC~~ SOLN: 1.0000 mg/kg | Freq: Once | SUBCUTANEOUS | Status: DC

## 2014-10-28 NOTE — ED Provider Notes (Signed)
CSN: [id number]     Arrival date & time 10/28/14  1628 History   First MD Initiated Contact with Patient 10/28/14 1637     Chief Complaint  Patient presents with  . Chest Pain      HPI Patient reports intermittent chest pressure with associated shortness of breath over the past week.  He presents after chest pressure occurred while at work.  He is no longer having active chest pain at this time.  Per the patient he has a history of MI with 2 stents in 2007 however review the medical record demonstrates what sounds like nonobstructive coronary artery disease based on a heart catheterization in 2007 performed at [facility] which was deemed to be nonobstructive and therefore the patient was placed on medical management of his nonobstructive coronary artery disease.  There is question of plaque rupture at time of the heart catheterization in 2007 however there was no flow limitation at that time and therefore this was not thought to be significant.  The patient reports that he then had a repeat heart catheterization in 2008 at [facility] regional for which the records are not available.  Per his last cardiology note there is no intervention performed at that time either.  Patient reports a pressure sensation in his chest and a heaviness in his chest with radiation up into his left neck and left jaw.  He states it feels like his prior episodes of pressure the results of heart catheterizations previously.  He gets some relief with nitroglycerin.  It is not always associated with exertion but he attributes most of the discomfort and pressure to "stress".  He reports no shortness of breath at this time.  He reports no back pain.  No discomfort or pain down his left arm.  Denies nausea or vomiting at this time.  No diaphoresis.  No recent illness.  Denies melena or hematochezia.   Past Medical History  Diagnosis Date  . History of chicken pox     childhood  . GERD (gastroesophageal reflux disease)    . Psoriasis   . Myocardial infarction 2006? 2010  . Anginal pain 2006?; 2010  . Pneumonia 2012  . History of bronchitis     "not chronic; have had it a few times" (11/28/2012)  . Daily headache     "qod" (11/28/2012)  . Facial cellulitis 11/28/2012    "wart removed under right eye 11/25/2012)  . Coronary artery disease     stents 2005  . Back pain, lumbosacral 01/04/2013   Past Surgical History  Procedure Laterality Date  . Tonsillectomy  1960's  . Cholecystectomy  2007  . Knee arthroplasty  1970's    "both; I used to rodeo" (11/28/2012)  . Coronary angioplasty with stent placement  ~ 2006    "2" (11/28/2012)  . Coronary angioplasty  2009  . Facial cosmetic surgery  11/25/2012    "wart removed under right eye; presents to hospital w/right eye swelling 11/28/2012)   Family History  Problem Relation Age of Onset  . Arthritis Maternal Grandmother   . Heart disease Sister   . Diabetes Sister   . Cancer - Ovarian Sister   . Heart disease      maternal/paternal grandparents  . Cirrhosis Mother     deceased age 19   History  Substance Use Topics  . Smoking status: Current Every Day Smoker -- 1.50 packs/day for 43 years    Types: Cigarettes    Last Attempt to Quit: 08/01/2011  .  Smokeless tobacco: Current User     Comment: 11/28/2012 "chew occasionally"  . Alcohol Use: Yes     Comment: 11/28/2012 "beer q 6 months or so"    Review of Systems  All other systems reviewed and are negative.     Allergies  Review of patient's allergies indicates no known allergies.  Home Medications   Prior to Admission medications   Medication Sig Start Date End Date Taking? Authorizing Provider  baclofen (LIORESAL) 10 MG tablet Take 1 tablet (10 mg total) by mouth 3 (three) times daily. Prn back pain 01/01/13   Mosie Lukes, MD  clindamycin (CLEOCIN) 300 MG capsule Take 1 capsule (300 mg total) by mouth 3 (three) times daily. 02/21/13   Midge Minium, MD  HYDROcodone-acetaminophen [facility]) 5-325 MG  per tablet 1-2 tabs every 6 hrs prn 02/21/13   Midge Minium, MD  predniSONE (DELTASONE) 20 MG tablet 3 tabs po daily x 5 days then 2 tabs po daily x 5 days then 1 tabs po daily x 5 days then 1/2 tab po daily x 5 days 01/01/13   Mosie Lukes, MD  predniSONE (DELTASONE) 5 MG tablet 1 tab po daily x 5 days, 1/2 tab po daily x 5 days  Start after finished with 20 mg tabs 01/01/13   Mosie Lukes, MD  ranitidine (ZANTAC) 300 MG capsule Take 1 capsule (300 mg total) by mouth every evening. 01/01/13   Mosie Lukes, MD  traMADol (ULTRAM) 50 MG tablet Take 1 tablet (50 mg total) by mouth every 8 (eight) hours as needed for pain. 02/18/13   Midge Minium, MD   BP 153/72 mmHg  Pulse 81  Temp(Src) 98.1 F (36.7 C) (Oral)  Resp 20  Ht 6\' 4"  (1.93 m)  Wt 325 lb (147.419 kg)  BMI 39.58 kg/m2  SpO2 95% Physical Exam  Constitutional: He is oriented to person, place, and time. He appears well-developed and well-nourished.  HENT:  Head: Normocephalic and atraumatic.  Eyes: EOM are normal.  Neck: Normal range of motion.  Cardiovascular: Normal rate, regular rhythm, normal heart sounds and intact distal pulses.   Pulmonary/Chest: Effort normal and breath sounds normal. No respiratory distress.  Abdominal: Soft. He exhibits no distension. There is no tenderness.  Musculoskeletal: Normal range of motion.  Neurological: He is alert and oriented to person, place, and time.  Skin: Skin is warm and dry.  Psychiatric: He has a normal mood and affect. Judgment normal.  Nursing note and vitals reviewed.   ED Course  Procedures (including critical care time) Labs Review Labs Reviewed  CBC - Abnormal; Notable for the following:    RBC 3.92 (*)    Hemoglobin 12.4 (*)    HCT 37.0 (*)    Platelets 47 (*)    All other components within normal limits  COMPREHENSIVE METABOLIC PANEL - Abnormal; Notable for the following:    Albumin 3.3 (*)    AST 73 (*)    ALT 66 (*)    Total Bilirubin 1.3 (*)     All other components within normal limits  TROPONIN I    Imaging Review Dg Chest 2 View  10/28/2014   CLINICAL DATA:  Left upper chest pain for 2 weeks  EXAM: CHEST  2 VIEW  COMPARISON:  03/17/2010  FINDINGS: Cardiac shadow is within normal limits. Lungs are well aerated bilaterally. No focal infiltrate or effusion is noted. No acute bony abnormality is seen.  IMPRESSION: No acute abnormality noted.  Electronically Signed   By: Inez Catalina M.D.   On: 10/28/2014 17:25  I personally reviewed the imaging tests through PACS system I reviewed available ER/hospitalization records through the EMR    EKG Interpretation   Date/Time:  Tuesday October 28 2014 16:35:55 EST Ventricular Rate:  75 PR Interval:  148 QRS Duration: 88 QT Interval:  400 QTC Calculation: 446 R Axis:   72 Text Interpretation:  Normal sinus rhythm with sinus arrhythmia Septal  infarct , age undetermined Abnormal ECG No old tracing to compare  Confirmed by Chela Sutphen  MD, Lennette Bihari ([id number]) on 10/28/2014 4:40:30 PM      MDM   Final diagnoses:  Chest pain    No active pain at this time.  EKG without significant ischemic changes and without significant changes from prior EKG after review of the record.  Pole of 324 mg of aspirin given.  No active pain at this time.  Given his ongoing smoking as well as his obesity I'm concerned about the possibility of unstable angina with possible progression of his nonobstructive coronary artery disease.  No active pain at this time.  Admit to telemetry.  We'll hold on heparin and low molecular weight heparin at this time given his thrombocytopenia.  Review of records demonstrates 2 prior episodes of thrombocytopenia.  This will need to be looked into further as well.    Hoy Morn, MD 10/28/14 1910

## 2014-10-28 NOTE — H&P (Signed)
Admit date: 10/28/2014 Referring Physician: Dr. Venora Maples Primary Cardiologist: Dr. Stanford Breed Chief complaint/reason for admission:chest pain  HPI: This is a 58yo WM who was admitted in 2007 with chest pain and had an abnormal nuclear study; enzymes negative. He subsequently underwent cardiac catheterization by Dr. Tamala Julian which revealed; EF is 60%. Ostial LM less than 30% narrowing. No significant LAD disease. 30-40 OM; The right coronary artery contained mid 30-40% stenosis with suggestion of possible ruptured plaque, but there was no flow-limiting lesion noted. Medical therapy recommended. Echo in 2007 showed an EF of 50%. Patient apparently had a cardiac catheterization at [facility] in 2008. No intervention.  Patient then had return of CP and was admitted to [facility] and apparently ruled out.  A stress test was normal by his report. For the past week he has had intermittent chest pressure associated with SOB.  While at work today he developed jaw pain that radiated into his Chest that was a pressure and heaviness similar to his prior cardiac pain.  He has been having this for about a month.  He does get relief with NTG.  Most episodes occur with stress.  He did not have any SOB today, just the chest pressure.  He has had CP though with SOB.  He also was nauseated and diaphoretic as well. The CP woke him up the other night.  He had to take 2 SL NTG for resolution of the pain.  He also has had problems with SOB and has had 2-3 pillow orthopnea along with PND.   He was pain free in the ER at [facility].  He now presents to [facility] for further evaluation.  He currently denies any chest pain.   PMH:    Past Medical History  Diagnosis Date  . History of chicken pox     Childhood  . GERD (gastroesophageal reflux disease)   . Psoriasis   . Pneumonia 2012  . History of bronchitis   . Frequent headaches   . Facial cellulitis 2014    Periorbital  . Coronary artery disease    Evaluated by Dr. Stanford Breed 2012 - nonobstructive 2007-2008 (not all records available)  . Back pain, lumbosacral 2014  . Thrombocytopenia     Onset unclear, but noted 2012    PSH:    Past Surgical History  Procedure Laterality Date  . Tonsillectomy  1960's  . Cholecystectomy  2007  . Knee arthroplasty  1970's  . Facial cosmetic surgery  2014  . Cardiac catheterization      ALLERGIES:   Review of patient's allergies indicates no known allergies.  Prior to Admit Meds:   Prescriptions prior to admission  Medication Sig Dispense Refill Last Dose  . aspirin 81 MG tablet Take 81 mg by mouth daily as needed for pain.      Family HX:    Family History  Problem Relation Age of Onset  . Arthritis Maternal Grandmother   . Heart disease Sister   . Diabetes Sister   . Cancer - Ovarian Sister   . Heart disease      Maternal/paternal grandparents  . Cirrhosis Mother    Social HX:    History   Social History  . Marital Status: Married    Spouse Name: N/A    Number of Children: N/A  . Years of Education: N/A   Occupational History  . Not on file.   Social History Main Topics  . Smoking status: Current Every Day  Smoker -- 1.50 packs/day for 43 years    Types: Cigarettes    Last Attempt to Quit: 08/01/2011  . Smokeless tobacco: Current User     Comment: 11/28/2012 "chew occasionally"  . Alcohol Use: Yes     Comment: 11/28/2012 "beer q 6 months or so"  . Drug Use: No  . Sexual Activity: No   Other Topics Concern  . Not on file   Social History Narrative     ROS:  All 11 ROS were addressed and are negative except what is stated in the HPI  PHYSICAL EXAM Filed Vitals:   10/28/14 2114  BP: 132/65  Pulse: 66  Temp: 98.6 F (37 C)  Resp: 18   General: Well developed, well nourished, in no acute distress Head: Eyes PERRLA, No xanthomas.   Normal cephalic and atramatic  Lungs:   Clear bilaterally to auscultation and percussion. Heart:   HRRR S1 S2 Pulses are 2+ & equal.             No carotid bruit. No JVD.  No abdominal bruits. No femoral bruits. Abdomen: Bowel sounds are positive, abdomen soft and non-tender without masses  Extremities:   No clubbing, cyanosis or edema.  DP +1 Neuro: Alert and oriented X 3. Psych:  Good affect, responds appropriately   Labs:   Lab Results  Component Value Date   WBC 4.2 10/28/2014   HGB 12.4* 10/28/2014   HCT 37.0* 10/28/2014   MCV 94.4 10/28/2014   PLT 47* 10/28/2014     Recent Labs Lab 10/28/14 1645  NA 143  K 4.0  CL 105  CO2 25  BUN 9  CREATININE 0.70  CALCIUM 9.1  PROT 7.2  BILITOT 1.3*  ALKPHOS 81  ALT 66*  AST 73*  GLUCOSE 96   Lab Results  Component Value Date   CKTOTAL 2039* 08/03/2011   TROPONINI <0.30 10/28/2014   No results found for: PTT No results found for: INR, PROTIME   Lab Results  Component Value Date   CHOL 209* 08/15/2011   Lab Results  Component Value Date   HDL 28* 08/15/2011   Lab Results  Component Value Date   LDLCALC 148* 08/15/2011   Lab Results  Component Value Date   TRIG 164* 08/15/2011   Lab Results  Component Value Date   CHOLHDL 7.5 08/15/2011   No results found for: LDLDIRECT    Radiology:  NAD EKG:  NSR with septal infarct and nonspecific ST abnormality.  Since last tracing nonspecific ST abnormality is new  ASSESSMENT:  1.  Chest pain concerning for underlying coronary ischemia.  His symptoms have been occurring for a week with SOB.  Today's episode of CP did not include SOB.  EKG shows some nonspecific ST changes compared to 2014.  Initial cardiac enzymes are normal.   2.  ASCAD - nonobstructive by cath 2007 3.  GERD 4.  Thrombocytopenia with platelet count 47  PLAN:  1.  Admit to tele bed 2.  Cycle cardiac enzymes 3.  Hold on anticoagulation at present due to thrombocytopenia 4.  Change Zantac to Protonix 5.  NPO after MN  6.  In light of his significant thrombocytopenia, would consider stress Myoview in am if enzymes negative to  document inducible ischemia prior to considering cath.  If he needs cath he will need hematology input prior to cath to determined etiology of thrombocytopenia. 7.  Heme consult in am for worsening thrombocytopenia 8.  Check 2D echo to evaluate LVF  9.  Start Lopressor 12.5mg  BID. 10.  SCDs for VTE prophylaxis  Sueanne Margarita, MD  10/28/2014  10:42 PM

## 2014-10-28 NOTE — ED Notes (Signed)
Pt reports CP x 1 week. Reports taking NTG with some relief. Hx MI with 2 stents in 2007. Was also told he had 70% blockage with last visit. Reports increased stress recently. Left sided pain, radiating into left jaw.

## 2014-10-29 ENCOUNTER — Inpatient Hospital Stay (HOSPITAL_COMMUNITY): Payer: Medicaid Other

## 2014-10-29 DIAGNOSIS — I200 Unstable angina: Secondary | ICD-10-CM | POA: Diagnosis not present

## 2014-10-29 DIAGNOSIS — K760 Fatty (change of) liver, not elsewhere classified: Secondary | ICD-10-CM | POA: Diagnosis not present

## 2014-10-29 DIAGNOSIS — R079 Chest pain, unspecified: Secondary | ICD-10-CM

## 2014-10-29 DIAGNOSIS — D696 Thrombocytopenia, unspecified: Secondary | ICD-10-CM | POA: Diagnosis not present

## 2014-10-29 DIAGNOSIS — D72819 Decreased white blood cell count, unspecified: Secondary | ICD-10-CM

## 2014-10-29 DIAGNOSIS — D638 Anemia in other chronic diseases classified elsewhere: Secondary | ICD-10-CM | POA: Diagnosis not present

## 2014-10-29 DIAGNOSIS — I369 Nonrheumatic tricuspid valve disorder, unspecified: Secondary | ICD-10-CM | POA: Diagnosis not present

## 2014-10-29 LAB — COMPREHENSIVE METABOLIC PANEL
ALBUMIN: 3.4 g/dL — AB (ref 3.5–5.2)
ALK PHOS: 77 U/L (ref 39–117)
ALT: 65 U/L — ABNORMAL HIGH (ref 0–53)
AST: 69 U/L — AB (ref 0–37)
Anion gap: 12 (ref 5–15)
BUN: 9 mg/dL (ref 6–23)
CO2: 24 mEq/L (ref 19–32)
Calcium: 9.1 mg/dL (ref 8.4–10.5)
Chloride: 103 mEq/L (ref 96–112)
Creatinine, Ser: 0.75 mg/dL (ref 0.50–1.35)
GFR calc Af Amer: >90 mL/min (ref >90)
Glucose, Bld: 122 mg/dL — ABNORMAL HIGH (ref 70–99)
POTASSIUM: 3.9 meq/L (ref 3.7–5.3)
Sodium: 139 mEq/L (ref 137–147)
Total Bilirubin: 1.7 mg/dL — ABNORMAL HIGH (ref 0.3–1.2)
Total Protein: 6.9 g/dL (ref 6.0–8.3)

## 2014-10-29 LAB — CBC
HCT: 38.1 % — ABNORMAL LOW (ref 39.0–52.0)
Hemoglobin: 12.7 g/dL — ABNORMAL LOW (ref 13.0–17.0)
MCH: 31.3 pg (ref 26.0–34.0)
MCHC: 33.3 g/dL (ref 30.0–36.0)
MCV: 93.8 fL (ref 78.0–100.0)
PLATELETS: 49 10*3/uL — AB (ref 150–400)
RBC: 4.06 MIL/uL — ABNORMAL LOW (ref 4.22–5.81)
RDW: 14.4 % (ref 11.5–15.5)
WBC: 3.9 10*3/uL — AB (ref 4.0–10.5)

## 2014-10-29 LAB — BASIC METABOLIC PANEL
Anion gap: 11 (ref 5–15)
BUN: 10 mg/dL (ref 6–23)
CALCIUM: 8.6 mg/dL (ref 8.4–10.5)
CO2: 25 mEq/L (ref 19–32)
Chloride: 106 mEq/L (ref 96–112)
Creatinine, Ser: 0.8 mg/dL (ref 0.50–1.35)
GLUCOSE: 104 mg/dL — AB (ref 70–99)
Potassium: 4 mEq/L (ref 3.7–5.3)
SODIUM: 142 meq/L (ref 137–147)

## 2014-10-29 LAB — SEDIMENTATION RATE: Sed Rate: 8 mm/hr (ref 0–16)

## 2014-10-29 LAB — TROPONIN I
Troponin I: <0.3 ng/mL (ref <0.30)
Troponin I: <0.3 ng/mL (ref <0.30)

## 2014-10-29 LAB — VITAMIN B12: VITAMIN B 12: 612 pg/mL (ref 211–911)

## 2014-10-29 LAB — SAVE SMEAR

## 2014-10-29 LAB — TSH: TSH: 2.21 u[IU]/mL (ref 0.350–4.500)

## 2014-10-29 LAB — LIPID PANEL
CHOLESTEROL: 174 mg/dL (ref 0–200)
HDL: 24 mg/dL — ABNORMAL LOW (ref >39)
LDL CALC: 115 mg/dL — AB (ref 0–99)
Total CHOL/HDL Ratio: 7.3 RATIO
Triglycerides: 174 mg/dL — ABNORMAL HIGH (ref <150)
VLDL: 35 mg/dL (ref 0–40)

## 2014-10-29 LAB — RHEUMATOID FACTOR: Rhuematoid fact SerPl-aCnc: <10 IU/mL (ref <=14)

## 2014-10-29 LAB — HEMOGLOBIN A1C
Hgb A1c MFr Bld: 5.6 % (ref <5.7)
MEAN PLASMA GLUCOSE: 114 mg/dL (ref <117)

## 2014-10-29 LAB — PROTIME-INR
INR: 1.28 (ref 0.00–1.49)
Prothrombin Time: 16.1 seconds — ABNORMAL HIGH (ref 11.6–15.2)

## 2014-10-29 LAB — MAGNESIUM: Magnesium: 1.7 mg/dL (ref 1.5–2.5)

## 2014-10-29 MED ORDER — REGADENOSON 0.4 MG/5ML IV SOLN
0.4000 mg | Freq: Once | INTRAVENOUS | Status: AC
  Filled 2014-10-29: qty 5

## 2014-10-29 MED ORDER — PANTOPRAZOLE SODIUM 40 MG PO TBEC
40.0000 mg | DELAYED_RELEASE_TABLET | Freq: Every day | ORAL | Status: DC
  Administered 2014-10-29 – 2014-10-31 (×3): 40 mg via ORAL
  Filled 2014-10-29 (×3): qty 1

## 2014-10-29 MED ORDER — REGADENOSON 0.4 MG/5ML IV SOLN: 0.4000 mg | Freq: Once | INTRAVENOUS | Status: AC

## 2014-10-29 MED ORDER — REGADENOSON 0.4 MG/5ML IV SOLN
INTRAVENOUS | Status: AC
  Administered 2014-10-29: 0.4 mg
  Filled 2014-10-29: qty 5

## 2014-10-29 MED ORDER — PNEUMOCOCCAL VAC POLYVALENT 25 MCG/0.5ML IJ INJ
0.5000 mL | INJECTION | INTRAMUSCULAR | Status: AC
Start: 2014-10-30 — End: 2014-10-30
  Administered 2014-10-30: 0.5 mL via INTRAMUSCULAR
  Filled 2014-10-29: qty 0.5

## 2014-10-29 MED ORDER — PREDNISONE 20 MG PO TABS
80.0000 mg | ORAL_TABLET | Freq: Every day | ORAL | Status: DC
  Administered 2014-10-29 – 2014-10-31 (×3): 80 mg via ORAL
  Filled 2014-10-29 (×3): qty 4

## 2014-10-29 NOTE — Progress Notes (Signed)
Patient Name: Vincent Ochoa Date of Encounter: 10/29/2014  Primary Cardiologist: Dr. Stanford Breed   Active Problems:   Chest pain    SUBJECTIVE  No longer have any CP. No SOB. States his CP is similar to what he experienced before with MI. However he's been cough up thick phlegm since Fri  CURRENT MEDS . atorvastatin  40 mg Oral q1800  . metoprolol tartrate  12.5 mg Oral BID  . [START ON 10/30/2014] pneumococcal 23 valent vaccine  0.5 mL Intramuscular Tomorrow-1000  . sodium chloride  3 mL Intravenous Q12H    OBJECTIVE  Filed Vitals:   10/28/14 1830 10/28/14 1900 10/28/14 2114 10/29/14 0526  BP: 118/60 127/54 132/65 100/57  Pulse: 57 64 66 68  Temp:   98.6 F (37 C) 98.1 F (36.7 C)  TempSrc:   Oral   Resp: 12 13 18 18   Height:   6\' 4"  (1.93 m)   Weight:   295 lb 6.4 oz (133.993 kg) 297 lb 1.6 oz (134.764 kg)  SpO2: 97% 98% 95% 95%    Intake/Output Summary (Last 24 hours) at 10/29/14 0906 Last data filed at 10/29/14 0500  Gross per 24 hour  Intake    243 ml  Output    350 ml  Net   -107 ml   Filed Weights   10/28/14 1634 10/28/14 2114 10/29/14 0526  Weight: 325 lb (147.419 kg) 295 lb 6.4 oz (133.993 kg) 297 lb 1.6 oz (134.764 kg)    PHYSICAL EXAM  General: Pleasant, NAD. Neuro: Alert and oriented X 3. Moves all extremities spontaneously. Psych: Normal affect. HEENT:  Normal  Neck: Supple without bruits or JVD. Lungs:  Resp regular and unlabored, CTA. Heart: RRR no s3, s4, or murmurs. Abdomen: Soft, non-tender, non-distended, BS + x 4.  Extremities: No clubbing, cyanosis or edema. DP/PT/Radials 2+ and equal bilaterally.  Accessory Clinical Findings  CBC  Recent Labs  10/28/14 2255 10/29/14 0455  WBC 3.9* 3.9*  NEUTROABS 1.8  --   HGB 12.7* 12.7*  HCT 37.5* 38.1*  MCV 93.1 93.8  PLT 46* 49*   Basic Metabolic Panel  Recent Labs  10/28/14 2255 10/29/14 0455  NA 139 142  K 3.9 4.0  CL 103 106  CO2 24 25  GLUCOSE 122* 104*  BUN 9 10    CREATININE 0.75 0.80  CALCIUM 9.1 8.6  MG 1.7  --    Liver Function Tests  Recent Labs  10/28/14 1645 10/28/14 2255  AST 73* 69*  ALT 66* 65*  ALKPHOS 81 77  BILITOT 1.3* 1.7*  PROT 7.2 6.9  ALBUMIN 3.3* 3.4*   Cardiac Enzymes  Recent Labs  10/28/14 1645 10/28/14 2255 10/29/14 0455  TROPONINI <0.30 <0.30 <0.30   Fasting Lipid Panel  Recent Labs  10/29/14 0455  CHOL 174  HDL 24*  LDLCALC 115*  TRIG 174*  CHOLHDL 7.3   Thyroid Function Tests  Recent Labs  10/28/14 2329  TSH 2.210    TELE NSR with HR 50-60s    ECG  NSR with nonspecific ST changes  Echocardiogram  pending    Radiology/Studies  Dg Chest 2 View  10/28/2014   CLINICAL DATA:  Left upper chest pain for 2 weeks  EXAM: CHEST  2 VIEW  COMPARISON:  03/17/2010  FINDINGS: Cardiac shadow is within normal limits. Lungs are well aerated bilaterally. No focal infiltrate or effusion is noted. No acute bony abnormality is seen.  IMPRESSION: No acute abnormality noted.   Electronically Signed  By: Inez Catalina M.D.   On: 10/28/2014 17:25    ASSESSMENT AND PLAN  1. Chest pain concerning for underlying coronary ischemia. His symptoms have been occurring for a week with SOB. Today's episode of CP did not include SOB. EKG shows some nonspecific ST changes compared to 2014. Initial cardiac enzymes are normal.   - neg serial enzyme  - poor candidate for cath per Dr. Radford Pax given worsening thrombocytopenia, although sx concerning for angina  - will obtain 2 day lexiscan stress test (had BB this morning) to risk stratify the size of ischemia. Meanwhile, will obtain hematology consult for worsening thrombocytopenia in case myoview abnormal and need cath. Does have productive cough since last Fri, however CXR negative   2.ASCAD - nonobstructive by cath 2007 3.GERD 4. HLD: LDL 115, HDL 24: continue statin 5.Thrombocytopenia with platelet count 47 6. LE swelling, LLE worse than RLE, denies any  h/o blood clot. Chronic LE pain per pt. Hilbert Corrigan PA-C Pager: [phone]  Patient seen, examined. Available data reviewed. Agree with findings, assessment, and plan as outlined by Almyra Deforest, PA-C. Exam reveals alert oriented male in NAD, lungs CTA, heart RRR without murmur, no peripheral edema. EKG shows NSR with nonspecific ST abnormality. Sx's are highly typical for crescendo angina. Pt has undergone 1st day of 2 day Myoview scan. I expect this to be abnormal based on his risk factors and typical history of angina. Will need hematology to address whether he could be treated with dual antiplatelet Rx for a short duration if he requires PCI. I suspect his risk would be significant with plt count approx 50,000. Will hold off on ASA until seen by hematology. Plan d/w with patient and wife. Will f/u tomorrow pm after stress Myoview result available. Appreciate heme consultation in advance.   Sherren Mocha, M.D. 10/29/2014 12:51 PM

## 2014-10-29 NOTE — Plan of Care (Signed)
Problem: Phase I Progression Outcomes Goal: Hemodynamically stable Outcome: Completed/Met Date Met:  10/29/14 Goal: Anginal pain relieved Outcome: Completed/Met Date Met:  10/29/14 Goal: Aspirin unless contraindicated Outcome: Completed/Met Date Met:  10/29/14 Goal: Voiding-avoid urinary catheter unless indicated Outcome: Completed/Met Date Met:  10/29/14

## 2014-10-29 NOTE — Care Management Note (Unsigned)
    Page 1 of 1   10/29/2014     10:35:07 AM CARE MANAGEMENT NOTE 10/29/2014  Patient:  Vincent Ochoa, Vincent Ochoa   Account Number:  [id number]  Date Initiated:  10/29/2014  Documentation initiated by:  GRAVES-BIGELOW,Nikea Settle  Subjective/Objective Assessment:   Pt admitted for cp. Plans for 2 day Stress Myoview.     Action/Plan:   No needs identified by CM at this time.   Anticipated DC Date:  10/31/2014   Anticipated DC Plan:  [place]  CM consult      Choice offered to / List presented to:             Status of service:  In process, will continue to follow Medicare Important Message given?  NO (If response is "NO", the following Medicare IM given date fields will be blank) Date Medicare IM given:   Medicare IM given by:   Date Additional Medicare IM given:   Additional Medicare IM given by:    Discharge Disposition:    Per UR Regulation:  Reviewed for med. necessity/level of care/duration of stay  If discussed at [place] of Stay Meetings, dates discussed:    Comments:

## 2014-10-29 NOTE — Progress Notes (Signed)
Talked with Dr. Alvy Bimler for hematology consult. Patient will be seen this afternoon for thrombocytopenia. Appreciate any input by hematology/oncology.  Hilbert Corrigan PA Pager: [phone]

## 2014-10-29 NOTE — Progress Notes (Signed)
  Echocardiogram 2D Echocardiogram has been performed.  Vincent Ochoa 10/29/2014, 1:25 PM

## 2014-10-29 NOTE — Progress Notes (Signed)
Stress part of 2 day lexiscan myoview done without complication  Hilbert Corrigan PA Pager: [phone]

## 2014-10-29 NOTE — Progress Notes (Signed)
UR Completed Cordale Manera Graves-Bigelow, RN,BSN [phone]  

## 2014-10-29 NOTE — Consult Note (Signed)
[place]  Telephone:([phone]) [place] NOTE  FUAD FORGET                                MR#: [id number]  DOB: [birth year]                              CSN#: [id number]  Patient Care Team: Burnice Logan, MD as PCP - General (Internal Medicine)   Referring MD: [facility]   Consulting MD: Dr. Alvy Bimler  Reason for Consult: Thrombocytopenia I have seen the patient, examined him and edited the notes as follows  Vincent Ochoa 58 y.o. male with a history of cardiac disease, admitted on 10/28/2014, transferred from [facility] with chest pain and shortness of breath, not amenable to outpatient management.  He was found to have abnormal CBC, with platelet count of 47,000, and mild anemia. His white count was slightly below normal at 3.9, but with normal differential. Of note, his total bilirubin was noted to be somewhat elevated. On review of his blood work from 2012, his platelet count for toilet from 46,000 212,000. He was never evaluated by a hematologist.  He denies recent bruising or acute bleeding, such as spontaneous hematuria, melena or hematochezia. He did have some nose bleeds prior to admission, almost weekly for the last few weeks coming from the left nasal passage after she sneezes. They are usually self-limiting and usually stopped bleeding after several seconds. He carries a history of psoriasis, with a recent flare of skin rashes about 4 weeks ago in his hands and elbows with associated diffuse bone pain. More than 20 years ago, he was treated with steroids and methotrexate at some point. He has not been treated with any treatment for psoriasis recently. The patient denies risk factors for HIV. He may have been exposed to hepatitis during Imprisonment a few years ago. Denies any ticks or other insect bites. In addition, he is recovering from the upper respiratory viral infection and week ago. 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 He has had exposure to [place] during [place] War.  Denies recent exposure to heparin or Lovenox. Denies any history of cardiac murmur or prior cardiovascular surgery. Denies recent new medications. Patient was on Baby aspirin prior to admission, without excessive amount of NSAIDs intake. He denies prior blood or platelet transfusions.  As underlying coronary ischemia is suspected, in the setting of cardiac workup showing nonspecific ST changes, and as the patient is a poor candidate for catheterization due to thrombocytopenia, Hematology consultation was requested  His prior ultrasound abdomen showed fatty liver disease  PMH:  Past Medical History  Diagnosis Date  . History of chicken pox     Childhood  . GERD (gastroesophageal reflux disease)   . Psoriasis   . Pneumonia 2012  . History of bronchitis   . Frequent headaches   . Facial cellulitis 2014    Periorbital  . Coronary artery disease     Evaluated by Dr. Stanford Breed 2012 - nonobstructive 2007-2008 (not all records available)  . Back pain, lumbosacral 2014  . Thrombocytopenia     Onset unclear, but noted 2012  Remote exposure to agent orange in the 1960s during [place] War  Surgeries:  Past Surgical  History  Procedure Laterality Date  . Tonsillectomy  1960's  . Cholecystectomy  2007  . Knee arthroplasty  1970's  . Facial cosmetic surgery  2014  . Cardiac catheterization      Allergies: No Known Allergies  Medications:    prior to admission:  Prescriptions prior to admission  Medication Sig Dispense Refill Last Dose  . aspirin 81 MG tablet Take 81-162 mg by mouth daily as needed for pain.    10/28/2014 at Unknown time    . atorvastatin  40 mg Oral q1800  . metoprolol tartrate  12.5 mg Oral BID  . [START ON 10/30/2014] pneumococcal 23 valent vaccine  0.5 mL Intramuscular Tomorrow-1000  . sodium  chloride  3 mL Intravenous Q12H    IWO:EHOZYY chloride, acetaminophen, nitroGLYCERIN, ondansetron (ZOFRAN) IV, sodium chloride  ROS: Constitutional: He complains of occasional night sweats Eyes: Denies blurriness of vision, double vision or watery eyes Ears, nose, mouth, throat, and face: Denies mucositis or sore throat Respiratory: He complains of productive cough, without hemoptysis exertional dyspnea, denies wheezes Cardiovascular: Denies palpitation, chest discomfort at this time. The patient has chronic lower extremity swelling, left worse than right but denies calf pain. Gastrointestinal:  Denies nausea, heartburn or change in bowel habits.No blood in the stools. He complains of abdominal pain in the left upper quadrant, worse with palpation Skin: He has been having a flare of psoriasis for the last 4 weeks Lymphatics: Denies new lymphadenopathy or easy bruising Neurological:Denies numbness, tingling or new weaknesses Behavioral/Psych: Mood is stable, no new changes  All other systems were reviewed with the patient and are negative.    Family History:    Family History  Problem Relation Age of Onset  . Arthritis Maternal Grandmother   . Heart disease Sister   . Diabetes Sister   . Cancer - Ovarian Sister   . Heart disease      Maternal/paternal grandparents  . Cirrhosis Mother   He has one sister with leukemia  Social History: The patient is married, no children. He works in Multimedia programmer. He is a [place] veteran, exposed to [employer]. He smokes about 1 pack a day of cigarettes for at least 40 years. He drinks minimal amount of alcohol. He denies any recreational drug intake.  Physical Exam    ECOG PERFORMANCE STATUS:1  Filed Vitals:   10/29/14 0526  BP: 100/57  Pulse: 68  Temp: 98.1 F (36.7 C)  Resp: 18   Filed Weights   10/28/14 1634 10/28/14 2114 10/29/14 0526  Weight: 325 lb (147.419 kg) 295 lb 6.4 oz (133.993 kg) 297 lb 1.6 oz (134.764 kg)     GENERAL:alert, no distress and comfortable. He is morbidly obese SKIN: skin color, texture, turgor are normal. No new open lesions, but visible areas of psoriasis are seen on the right dorsal aspect of his hand and on his elbows. He has several tattoos EYES: normal, conjunctiva are pink and non-injected, sclera clear OROPHARYNX: no exudate, no erythema and lips, buccal mucosa, and tongue normal NECK: supple, thyroid normal size, non-tender, without nodularity LYMPH:  no palpable lymphadenopathy in the cervical, axillary or inguinal area LUNGS: clear to auscultation and percussion with normal breathing effort HEART: regular rate & rhythm and no murmurs and 1+ bilateral extremity edema, Left greater than right ABDOMEN:abdomen Obese, protuberant, Tender on the left upper quadrant, cannot appreciate hepatosplenomegaly due to body habitus,  normal bowel sounds Musculoskeletal:no cyanosis of digits and no clubbing. Nails brittle.  PSYCH: alert & oriented  x 3 with fluent speech NEURO: no focal motor/sensory deficits He has bilateral gynecomastia  Labs:    Recent Labs Lab 10/28/14 1645 10/28/14 2255 10/29/14 0455  WBC 4.2 3.9* 3.9*  HGB 12.4* 12.7* 12.7*  HCT 37.0* 37.5* 38.1*  PLT 47* 46* 49*  MCV 94.4 93.1 93.8  MCH 31.6 31.5 31.3  MCHC 33.5 33.9 33.3  RDW 14.0 14.3 14.4  LYMPHSABS  --  1.5  --   MONOABS  --  0.4  --   EOSABS  --  0.2  --   BASOSABS  --  0.0  --         Recent Labs Lab 10/28/14 1645 10/28/14 2255 10/29/14 0455  NA 143 139 142  K 4.0 3.9 4.0  CL 105 103 106  CO2 '25 24 25  ' GLUCOSE 96 122* 104*  BUN '9 9 10  ' CREATININE 0.70 0.75 0.80  CALCIUM 9.1 9.1 8.6  MG  --  1.7  --   AST 73* 69*  --   ALT 66* 65*  --   ALKPHOS 81 77  --   BILITOT 1.3* 1.7*  --         Component Value Date/Time   BILITOT 1.7* 10/28/2014 2255   BILIDIR 0.1 07/29/2011 1628   IBILI 0.5 07/29/2011 1628      Recent Labs Lab 10/28/14 2255 10/29/14 0455  INR 1.29  1.28   I have reviewed his peripheral smear. There is absolute thrombocytopenia. Occasional large platelets are seen. There were no evidence of platelet clumping. I did not appreciate any schistocytes. Normal morphology of white blood cells.   Imaging Studies:  Dg Chest 2 View  10/28/2014   CLINICAL DATA:  Left upper chest pain for 2 weeks  EXAM: CHEST  2 VIEW  COMPARISON:  03/17/2010  FINDINGS: Cardiac shadow is within normal limits. Lungs are well aerated bilaterally. No focal infiltrate or effusion is noted. No acute bony abnormality is seen.  IMPRESSION: No acute abnormality noted.   Electronically Signed   By: Inez Catalina M.D.   On: 10/28/2014 17:25     A/P: 58 y.o. 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 There is a component of thrombocytopenia related to liver disease. The mild leukopenia could be associated with recent infection causing bone marrow suppression or autoimmune in nature He had history of hepatitis and I will order additional workup to exclude infection. His last hepatitis panel several years ago were negative. HIV is negative Patient have recurrent nosebleeds, but I suspect is not due to low platelet but rather due to forcefully sneezing.  I will order additional workup now but we'll proceed to start him on prednisone. I discussed the risks, benefits, side effects of prednisone and he agreed to proceed. I would expect brisk response to steroids within the next 24-48 hours If he has minimum response, one could consider IVIG. If the cardiac catheterization is urgent, I would recommend platelet transfusion. There is no indication to proceed with bone marrow biopsy.  Chest pain in the setting of chronic coronary artery disease Impression presented with chest pain concerning for  underlying coronary ischemia.  If urgent cardiac intervention is necessary, I would recommend platelet transfusion. He is currently on maximal medical management.  Anemia Likely of chronic disease and dilution. No transfusion is indicated at this time  Full Code  **Disclaimer: This note was dictated with voice recognition software. Similar sounding words can inadvertently be transcribed and this note may contain transcription errors which may not have been corrected upon publication of note.Sharene Butters E, PA-C 10/29/2014 10:46 AM  Sneha Willig, MD 10/29/2014

## 2014-10-30 ENCOUNTER — Observation Stay (HOSPITAL_COMMUNITY): Payer: Medicaid Other

## 2014-10-30 ENCOUNTER — Observation Stay (HOSPITAL_COMMUNITY): Payer: Self-pay

## 2014-10-30 DIAGNOSIS — K760 Fatty (change of) liver, not elsewhere classified: Secondary | ICD-10-CM | POA: Diagnosis not present

## 2014-10-30 DIAGNOSIS — D638 Anemia in other chronic diseases classified elsewhere: Secondary | ICD-10-CM | POA: Diagnosis not present

## 2014-10-30 DIAGNOSIS — I200 Unstable angina: Secondary | ICD-10-CM | POA: Diagnosis not present

## 2014-10-30 DIAGNOSIS — D696 Thrombocytopenia, unspecified: Secondary | ICD-10-CM | POA: Diagnosis not present

## 2014-10-30 DIAGNOSIS — R079 Chest pain, unspecified: Secondary | ICD-10-CM | POA: Diagnosis not present

## 2014-10-30 LAB — HEPATITIS PANEL, ACUTE
HCV Ab: NEGATIVE
HEP B S AG: NEGATIVE
Hep A IgM: NONREACTIVE
Hep B C IgM: NONREACTIVE

## 2014-10-30 LAB — CBC WITH DIFFERENTIAL/PLATELET
BASOS ABS: 0 10*3/uL (ref 0.0–0.1)
BASOS ABS: 0 10*3/uL (ref 0.0–0.1)
BASOS PCT: 0 % (ref 0–1)
Basophils Absolute: 0 10*3/uL (ref 0.0–0.1)
Basophils Relative: 0 % (ref 0–1)
Basophils Relative: 0 % (ref 0–1)
EOS ABS: 0 10*3/uL (ref 0.0–0.7)
EOS PCT: 0 % (ref 0–5)
EOS PCT: 0 % (ref 0–5)
Eosinophils Absolute: 0 10*3/uL (ref 0.0–0.7)
Eosinophils Absolute: 0 10*3/uL (ref 0.0–0.7)
Eosinophils Relative: 0 % (ref 0–5)
HCT: 38.8 % — ABNORMAL LOW (ref 39.0–52.0)
HCT: 39.9 % (ref 39.0–52.0)
HEMATOCRIT: 37.8 % — AB (ref 39.0–52.0)
HEMOGLOBIN: 12.9 g/dL — AB (ref 13.0–17.0)
Hemoglobin: 13.5 g/dL (ref 13.0–17.0)
Hemoglobin: 12.7 g/dL — ABNORMAL LOW (ref 13.0–17.0)
LYMPHS ABS: 0.8 10*3/uL (ref 0.7–4.0)
LYMPHS ABS: 0.8 10*3/uL (ref 0.7–4.0)
Lymphocytes Relative: 17 % (ref 12–46)
Lymphocytes Relative: 14 % (ref 12–46)
Lymphocytes Relative: 16 % (ref 12–46)
Lymphs Abs: 0.9 10*3/uL (ref 0.7–4.0)
MCH: 31.1 pg (ref 26.0–34.0)
MCH: 31.7 pg (ref 26.0–34.0)
MCH: 31.5 pg (ref 26.0–34.0)
MCHC: 33.2 g/dL (ref 30.0–36.0)
MCHC: 33.8 g/dL (ref 30.0–36.0)
MCHC: 33.6 g/dL (ref 30.0–36.0)
MCV: 93.5 fL (ref 78.0–100.0)
MCV: 93.7 fL (ref 78.0–100.0)
MCV: 93.8 fL (ref 78.0–100.0)
MONO ABS: 0.4 10*3/uL (ref 0.1–1.0)
Monocytes Absolute: 0.5 10*3/uL (ref 0.1–1.0)
Monocytes Absolute: 0.2 10*3/uL (ref 0.1–1.0)
Monocytes Relative: 10 % (ref 3–12)
Monocytes Relative: 4 % (ref 3–12)
Monocytes Relative: 7 % (ref 3–12)
Neutro Abs: 3.3 10*3/uL (ref 1.7–7.7)
Neutro Abs: 4.9 10*3/uL (ref 1.7–7.7)
Neutro Abs: 4.3 10*3/uL (ref 1.7–7.7)
Neutrophils Relative %: 73 % (ref 43–77)
Neutrophils Relative %: 83 % — ABNORMAL HIGH (ref 43–77)
Neutrophils Relative %: 77 % (ref 43–77)
Platelets: 48 10*3/uL — ABNORMAL LOW (ref 150–400)
Platelets: 51 10*3/uL — ABNORMAL LOW (ref 150–400)
Platelets: 47 10*3/uL — ABNORMAL LOW (ref 150–400)
RBC: 4.15 MIL/uL — AB (ref 4.22–5.81)
RBC: 4.26 MIL/uL (ref 4.22–5.81)
RBC: 4.03 MIL/uL — ABNORMAL LOW (ref 4.22–5.81)
RDW: 14 % (ref 11.5–15.5)
RDW: 14.1 % (ref 11.5–15.5)
RDW: 14.1 % (ref 11.5–15.5)
WBC: 4.6 10*3/uL (ref 4.0–10.5)
WBC: 6 10*3/uL (ref 4.0–10.5)
WBC: 5.7 10*3/uL (ref 4.0–10.5)

## 2014-10-30 LAB — TROPONIN I: Troponin I: <0.3 ng/mL (ref <0.30)

## 2014-10-30 LAB — ANA: ANA: NEGATIVE

## 2014-10-30 MED ORDER — TECHNETIUM TC 99M SESTAMIBI GENERIC - CARDIOLITE
30.0000 | Freq: Once | INTRAVENOUS | Status: AC | PRN
  Administered 2014-10-30: 30 via INTRAVENOUS

## 2014-10-30 MED ORDER — ACETAMINOPHEN 325 MG PO TABS
650.0000 mg | ORAL_TABLET | ORAL | Status: DC | PRN
Start: 2014-10-30 — End: 2014-10-31
  Administered 2014-10-31: 650 mg via ORAL
  Filled 2014-10-30: qty 2

## 2014-10-30 MED ORDER — SODIUM CHLORIDE 0.9 % IV SOLN
INTRAVENOUS | Status: DC
  Administered 2014-10-31: 04:00:00 via INTRAVENOUS

## 2014-10-30 MED ORDER — ASPIRIN 81 MG PO CHEW
81.0000 mg | CHEWABLE_TABLET | ORAL | Status: DC
Start: 2014-10-31 — End: 2014-10-31

## 2014-10-30 MED ORDER — DIPHENHYDRAMINE HCL 25 MG PO CAPS
50.0000 mg | ORAL_CAPSULE | ORAL | Status: DC | PRN
  Administered 2014-10-31: 50 mg via ORAL
  Filled 2014-10-30: qty 2

## 2014-10-30 MED ORDER — ACETAMINOPHEN 325 MG PO TABS: 650.0000 mg | ORAL_TABLET | ORAL | Status: DC | PRN

## 2014-10-30 MED ORDER — SODIUM CHLORIDE 0.9 % IJ SOLN: 3.0000 mL | INTRAMUSCULAR | Status: DC | PRN

## 2014-10-30 MED ORDER — SODIUM CHLORIDE 0.9 % IJ SOLN: 3.0000 mL | Freq: Two times a day (BID) | INTRAMUSCULAR | Status: DC

## 2014-10-30 MED ORDER — IMMUNE GLOBULIN (HUMAN) 10 GM/100ML IV SOLN
1.0000 g/kg | INTRAVENOUS | Status: DC
  Filled 2014-10-30: qty 1400

## 2014-10-30 MED ORDER — IMMUNE GLOBULIN (HUMAN) 10 GM/100ML IV SOLN
1.0000 g/kg | Freq: Every day | INTRAVENOUS | Status: AC
  Administered 2014-10-30 – 2014-10-31 (×2): 140 g via INTRAVENOUS
  Filled 2014-10-30 (×3): qty 1400

## 2014-10-30 MED ORDER — SODIUM CHLORIDE 0.9 % IV SOLN: 250.0000 mL | INTRAVENOUS | Status: DC | PRN

## 2014-10-30 NOTE — Progress Notes (Signed)
Vincent Ochoa   DOB:Sep 18, 1956   IZ#:[id number]   RJP#:[id number]  Patient Care Team: Burnice Logan, MD as PCP - General (Internal Medicine) I have seen the patient, examined him and edited the notes as follows  Subjective: Patient seen and examined. He continues to have episodes of chest pain, without significant shortness of breath. He complains of productive cough. No hemoptysis. Minimal epistaxis reported when forcefully blowing his nose. Denies hematemesis, blood in stools or in urine.Denies pruritus. Has not ambulated since admission due to symptoms.Appetite normal.No neurological changes reported.  Scheduled Meds: . atorvastatin  40 mg Oral q1800  . metoprolol tartrate  12.5 mg Oral BID  . pantoprazole  40 mg Oral Daily  . pneumococcal 23 valent vaccine  0.5 mL Intramuscular Tomorrow-1000  . predniSONE  80 mg Oral Q breakfast  . sodium chloride  3 mL Intravenous Q12H   Continuous Infusions:  PRN Meds:sodium chloride, acetaminophen, nitroGLYCERIN, ondansetron (ZOFRAN) IV, sodium chloride   Objective:  Filed Vitals:   10/30/14 0455  BP: 116/48  Pulse: 68  Temp: 97.8 F (36.6 C)  Resp: 18      Intake/Output Summary (Last 24 hours) at 10/30/14 0906 Last data filed at 10/29/14 1700  Gross per 24 hour  Intake    480 ml  Output      0 ml  Net    480 ml    ECOG PERFORMANCE STATUS:2   GENERAL: alert, no distress and comfortable. He is morbidly obese SKIN: skin color, texture, turgor are normal. No new open lesions, but visible areas of psoriasis are seen on the right dorsal aspect of his hand and on his elbows. He has several tattoos EYES: normal, conjunctiva are pink and non-injected, sclera clear OROPHARYNX: no exudate, no erythema and lips, buccal mucosa, and tongue normal NECK: no palpable lymphadenopathy in the cervical, axillary or inguinal area LUNGS: clear to auscultation and percussion with normal breathing effort HEART: regular rate & rhythm and no murmurs and  1+ bilateral extremity edema, Left greater than right ABDOMEN:abdomen Obese, protuberant, Tender on the left upper quadrant, cannot appreciate hepatosplenomegaly due to body habitus,normal bowel sounds Musculoskeletal:no cyanosis of digits and no clubbing. Nails brittle.  PSYCH: alert & oriented x 3 with fluent speech NEURO: no focal motor/sensory deficits He has bilateral gynecomastia   CBG (last 3)  No results for input(s): GLUCAP in the last 72 hours.   Labs:   Recent Labs Lab 10/28/14 1645 10/28/14 2255 10/29/14 0455 10/30/14 0622  WBC 4.2 3.9* 3.9* 4.6  HGB 12.4* 12.7* 12.7* 12.9*  HCT 37.0* 37.5* 38.1* 38.8*  PLT 47* 46* 49* 48*  MCV 94.4 93.1 93.8 93.5  MCH 31.6 31.5 31.3 31.1  MCHC 33.5 33.9 33.3 33.2  RDW 14.0 14.3 14.4 14.0  LYMPHSABS  --  1.5  --  0.8  MONOABS  --  0.4  --  0.5  EOSABS  --  0.2  --  0.0  BASOSABS  --  0.0  --  0.0     Chemistries:    Recent Labs Lab 10/28/14 1645 10/28/14 2255 10/29/14 0455  NA 143 139 142  K 4.0 3.9 4.0  CL 105 103 106  CO2 '25 24 25  ' GLUCOSE 96 122* 104*  BUN '9 9 10  ' CREATININE 0.70 0.75 0.80  CALCIUM 9.1 9.1 8.6  MG  --  1.7  --   AST 73* 69*  --   ALT 66* 65*  --   ALKPHOS 81 77  --  BILITOT 1.3* 1.7*  --     GFR Estimated Creatinine Clearance: 152.9 mL/min (by C-G formula based on Cr of 0.8).  Liver Function Tests:  Recent Labs Lab 10/28/14 1645 10/28/14 2255  AST 73* 69*  ALT 66* 65*  ALKPHOS 81 77  BILITOT 1.3* 1.7*  PROT 7.2 6.9  ALBUMIN 3.3* 3.4*   Coagulation profile  Recent Labs Lab 10/28/14 2255 10/29/14 0455  INR 1.29 1.28    Cardiac Enzymes:  Recent Labs Lab 10/28/14 1645 10/28/14 2255 10/29/14 0455 10/29/14 1330  TROPONINI <0.30 <0.30 <0.30 <0.30      Imaging Studies:  Dg Chest 2 View  10/28/2014   CLINICAL DATA:  Left upper chest pain for 2 weeks  EXAM: CHEST  2 VIEW  COMPARISON:  03/17/2010  FINDINGS: Cardiac shadow is within normal limits. Lungs are well  aerated bilaterally. No focal infiltrate or effusion is noted. No acute bony abnormality is seen.  IMPRESSION: No acute abnormality noted.   Electronically Signed   By: Inez Catalina M.D.   On: 10/28/2014 17:25    Assessment/Plan: 58 y.o.  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 There is a component of thrombocytopenia related to liver disease. The mild leukopenia could be associated with recent infection causing bone marrow suppression or autoimmune in nature. Today his numbers are showing improvement, although he is on prednisone which could affect results. He had history of hepatitis. His last hepatitis panel on 12/9 was negative. HIV is negative.  Patient have recurrent nosebleeds, but suspect is not due to low platelet but rather due to forcefully sneezing.  He was started on prednisone on 12/9 as mentioned above. With suboptimal response and need for urgent cardiac catheterization, I recommend IVIG The risks, benefits, side effects of IVIG were discussed with the patient and his wife and they agree to proceed. I was IVIG today and his second dose tomorrow morning. Will follow CBC closely. Other pending labs include vitamin B12, ANA, Sed Rate. Rheumatoid Factor is negative If the cardiac catheterization is urgent, would recommend platelet transfusion. There is no indication to proceed with bone marrow biopsy.  Chest pain in the setting of chronic coronary artery disease Impression presented with chest pain concerning for underlying coronary ischemia.  If urgent cardiac intervention is necessary, would recommend platelet transfusion. He is currently on maximal medical management.  Anemia Likely of chronic disease and dilution. No transfusion is indicated at this time  Full Code  Other medical  issues as per admitting team     **Disclaimer: This note was dictated with voice recognition software. Similar sounding words can inadvertently be transcribed and this note may contain transcription errors which may not have been corrected upon publication of note.Sharene Butters E, PA-C 10/30/2014  9:06 AM Bretton Tandy, MD 10/30/2014

## 2014-10-30 NOTE — Progress Notes (Signed)
UR completed 

## 2014-10-30 NOTE — Progress Notes (Signed)
Abnormal stress test noted, discussed with Dr. Burt Knack, tentatively plan for cardiac catheterization tomorrow. Currently scheduled for cath at 3 pm with Dr. Irish Lack. Dr. Burt Knack to discuss with patient and his wife stress test result and plan  Signed, Almyra Deforest PA Pager: [phone]

## 2014-10-30 NOTE — Progress Notes (Signed)
Patient Name: Vincent Ochoa Date of Encounter: 10/30/2014     Active Problems:   Chest pain   Unstable angina pectoris    SUBJECTIVE  Denies any SOB, continue to have intermittent CP, significant productive cough  CURRENT MEDS . atorvastatin  40 mg Oral q1800  . metoprolol tartrate  12.5 mg Oral BID  . pantoprazole  40 mg Oral Daily  . pneumococcal 23 valent vaccine  0.5 mL Intramuscular Tomorrow-1000  . predniSONE  80 mg Oral Q breakfast  . sodium chloride  3 mL Intravenous Q12H    OBJECTIVE  Filed Vitals:   10/29/14 1103 10/29/14 1356 10/29/14 2012 10/30/14 0455  BP: 131/66 107/42 123/66 116/48  Pulse: 68 59 56 68  Temp:  98.3 F (36.8 C) 98.6 F (37 C) 97.8 F (36.6 C)  TempSrc:  Oral Oral Oral  Resp:  18 18 18   Height:      Weight:    305 lb (138.347 kg)  SpO2:  95% 94% 92%    Intake/Output Summary (Last 24 hours) at 10/30/14 0828 Last data filed at 10/29/14 1700  Gross per 24 hour  Intake    480 ml  Output      0 ml  Net    480 ml   Filed Weights   10/28/14 2114 10/29/14 0526 10/30/14 0455  Weight: 295 lb 6.4 oz (133.993 kg) 297 lb 1.6 oz (134.764 kg) 305 lb (138.347 kg)    PHYSICAL EXAM  General: Pleasant, NAD. Neuro: Alert and oriented X 3. Moves all extremities spontaneously. Psych: Normal affect. HEENT:  Normal  Neck: Supple without bruits or JVD. Lungs:  Resp regular and unlabored, CTA. Heart: RRR no s3, s4, or murmurs. Abdomen: Soft, non-tender, non-distended, BS + x 4.  Extremities: No clubbing, cyanosis or edema. DP/PT/Radials 2+ and equal bilaterally.  Accessory Clinical Findings  CBC  Recent Labs  10/28/14 2255 10/29/14 0455 10/30/14 0622  WBC 3.9* 3.9* 4.6  NEUTROABS 1.8  --  3.3  HGB 12.7* 12.7* 12.9*  HCT 37.5* 38.1* 38.8*  MCV 93.1 93.8 93.5  PLT 46* 49* 48*   Basic Metabolic Panel  Recent Labs  10/28/14 2255 10/29/14 0455  NA 139 142  K 3.9 4.0  CL 103 106  CO2 24 25  GLUCOSE 122* 104*  BUN 9 10    CREATININE 0.75 0.80  CALCIUM 9.1 8.6  MG 1.7  --    Liver Function Tests  Recent Labs  10/28/14 1645 10/28/14 2255  AST 73* 69*  ALT 66* 65*  ALKPHOS 81 77  BILITOT 1.3* 1.7*  PROT 7.2 6.9  ALBUMIN 3.3* 3.4*   Cardiac Enzymes  Recent Labs  10/28/14 2255 10/29/14 0455 10/29/14 1330  TROPONINI <0.30 <0.30 <0.30   Hemoglobin A1C  Recent Labs  10/28/14 2255  HGBA1C 5.6   Fasting Lipid Panel  Recent Labs  10/29/14 0455  CHOL 174  HDL 24*  LDLCALC 115*  TRIG 174*  CHOLHDL 7.3   Thyroid Function Tests  Recent Labs  10/28/14 2329  TSH 2.210    TELE NSR with HR 60-70s    ECG  No new EKG  Echocardiogram 10/29/2014  LV EF: 60% -  65%  ------------------------------------------------------------------- Indications:   Chest pain 786.51.  ------------------------------------------------------------------- History:  PMH:  Dyspnea. Coronary artery disease. Risk factors: Current tobacco use. Dyslipidemia.  ------------------------------------------------------------------- Study Conclusions  - Left ventricle: The cavity size was mildly dilated. Systolic function was normal. The estimated ejection fraction was in the  range of 60% to 65%. Wall motion was normal; there were no regional wall motion abnormalities. Features are consistent with a pseudonormal left ventricular filling pattern, with concomitant abnormal relaxation and increased filling pressure (grade 2 diastolic dysfunction). Doppler parameters are consistent with elevated ventricular end-diastolic filling pressure. - Aortic valve: Trileaflet; normal thickness leaflets. There was no regurgitation. - Aortic root: The aortic root was normal in size. - Mitral valve: Structurally normal valve. - Left atrium: The atrium was moderately dilated. - Right ventricle: Systolic function was normal. - Right atrium: The atrium was mildly dilated. - Tricuspid valve: There  was mild regurgitation. - Pulmonic valve: There was no regurgitation. - Pulmonary arteries: Systolic pressure was within the normal range. - Inferior vena cava: The vessel was normal in size. - Pericardium, extracardiac: There was no pericardial effusion.     Radiology/Studies  Dg Chest 2 View  10/28/2014   CLINICAL DATA:  Left upper chest pain for 2 weeks  EXAM: CHEST  2 VIEW  COMPARISON:  03/17/2010  FINDINGS: Cardiac shadow is within normal limits. Lungs are well aerated bilaterally. No focal infiltrate or effusion is noted. No acute bony abnormality is seen.  IMPRESSION: No acute abnormality noted.   Electronically Signed   By: Inez Catalina M.D.   On: 10/28/2014 17:25    ASSESSMENT AND PLAN  1. Chest pain concerning for underlying coronary ischemia. His symptoms have been occurring for a week with SOB. Today's episode of CP did not include SOB. EKG shows some nonspecific ST changes compared to 2014. Initial cardiac enzymes are normal.  - neg serial enzyme. Echo 12/9 EF 16-10%, grade 2 diastolic dysfunction, no RWMA - poor candidate for cath per Dr. Radford Pax given worsening thrombocytopenia, although sx concerning for angina - completing resting image of 2 day myoview today, will followup on result. If abnormal, will arrange cath tomorrow  2. Significant productive cough  - however CXR negative, says his CP worse with deep breath, will give breath treatment later today after finish resting image, consider repeat 2 view CXR vs empiric antibiotic  3.ASCAD - nonobstructive by cath 2007 4.GERD 5. HLD: LDL 115, HDL 24: continue statin 6.Thrombocytopenia with platelet count 47 7. LE swelling, LLE worse than RLE, denies any h/o blood clot. Chronic LE pain per pt.  Hilbert Corrigan PA-C Pager: [phone]  Patient seen, examined. Available data reviewed. Agree with findings, assessment, and plan as outlined by Almyra Deforest, PA-C. The  patient is independently interviewed and examined. Exam reveals an alert, oriented male in no distress. Lungs are clear. Heart is regular rate and rhythm. There is no peripheral edema. I have reviewed the results of his nuclear stress test. The study is high risk with LV dilatation, inferolateral infarction with peri-infarct ischemia. Cardiac catheterization is clearly indicated in this gentleman who has developed CCS class III angina. Complicating matters is his thrombocytopenia. I appreciate hematology consultation. The patient has been started on systemic steroids. If his platelet count is improving tomorrow, will plan on proceeding with cardiac catheterization and possible PCI. If his platelets remain in the range of 50,000, may need to wait a few more days to allow steroids to effectively increase his platelet count. All of this was explained to the patient in detail.  I have reviewed the risks, indications, and alternatives to cardiac catheterization and possible PCI were reviewed with the patient. Risks include but are not limited to bleeding, infection, vascular injury, stroke, myocardial infection, arrhythmia, kidney injury, radiation-related injury in the case  of prolonged fluoroscopy use, emergency cardiac surgery, and death. The patient understands the risks of serious complication is low (<4%).   Sherren Mocha, M.D. 10/30/2014 4:24 PM

## 2014-10-30 NOTE — Plan of Care (Signed)
Problem: Consults Goal: Chest Pain Patient Education (See Patient Education module for education specifics.)  Outcome: Completed/Met Date Met:  10/30/14 Goal: Skin Care Protocol Initiated - if Braden Score 18 or less If consults are not indicated, leave blank or document N/A  Outcome: Completed/Met Date Met:  10/30/14 Goal: Tobacco Cessation referral if indicated Outcome: Completed/Met Date Met:  10/30/14 Goal: Nutrition Consult-if indicated Outcome: Completed/Met Date Met:  10/30/14 Goal: Diabetes Guidelines if Diabetic/Glucose > 140 If diabetic or lab glucose is > 140 mg/dl - Initiate Diabetes/Hyperglycemia Guidelines & Document Interventions  Outcome: Not Applicable Date Met:  75/10/25  Problem: Phase I Progression Outcomes Goal: MD aware of Cardiac Marker results Outcome: Completed/Met Date Met:  10/30/14 Goal: Other Phase I Outcomes/Goals Outcome: Completed/Met Date Met:  10/30/14  Problem: Phase II Progression Outcomes Goal: Hemodynamically stable Outcome: Completed/Met Date Met:  10/30/14 Goal: Anginal pain relieved Outcome: Progressing Goal: Stress Test if indicated Outcome: Completed/Met Date Met:  10/30/14 Goal: Cath/PCI Day Path if indicated Outcome: Progressing

## 2014-10-30 NOTE — Progress Notes (Signed)
Pt called out stating he had 6/10 chest pain/pressure and feeling light headed.  VSS, 4L O2 [place] applied, STAT EKG obtained, and chest pressure/pain relieved with one sublingual nitro.  Hao, [place] notified and orders received to obtain a STAT troponin. Will continue to closely monitor pt.

## 2014-10-31 ENCOUNTER — Encounter (HOSPITAL_COMMUNITY)
Admission: EM | Disposition: A | Discharge status: Home / Self Care | Payer: Self-pay | Source: Home / Self Care | Attending: Cardiology

## 2014-10-31 ENCOUNTER — Other Ambulatory Visit: Payer: Self-pay | Admitting: Physician Assistant

## 2014-10-31 ENCOUNTER — Encounter (HOSPITAL_COMMUNITY): Payer: Self-pay | Admitting: Physician Assistant

## 2014-10-31 DIAGNOSIS — D638 Anemia in other chronic diseases classified elsewhere: Secondary | ICD-10-CM | POA: Diagnosis present

## 2014-10-31 DIAGNOSIS — R0602 Shortness of breath: Secondary | ICD-10-CM

## 2014-10-31 DIAGNOSIS — R7989 Other specified abnormal findings of blood chemistry: Secondary | ICD-10-CM

## 2014-10-31 DIAGNOSIS — K760 Fatty (change of) liver, not elsewhere classified: Secondary | ICD-10-CM | POA: Diagnosis present

## 2014-10-31 DIAGNOSIS — I2510 Atherosclerotic heart disease of native coronary artery without angina pectoris: Secondary | ICD-10-CM

## 2014-10-31 DIAGNOSIS — D696 Thrombocytopenia, unspecified: Secondary | ICD-10-CM

## 2014-10-31 DIAGNOSIS — Z8619 Personal history of other infectious and parasitic diseases: Secondary | ICD-10-CM | POA: Diagnosis not present

## 2014-10-31 DIAGNOSIS — I200 Unstable angina: Secondary | ICD-10-CM | POA: Diagnosis not present

## 2014-10-31 DIAGNOSIS — K219 Gastro-esophageal reflux disease without esophagitis: Secondary | ICD-10-CM | POA: Diagnosis present

## 2014-10-31 DIAGNOSIS — R079 Chest pain, unspecified: Secondary | ICD-10-CM | POA: Diagnosis present

## 2014-10-31 DIAGNOSIS — Z7982 Long term (current) use of aspirin: Secondary | ICD-10-CM | POA: Diagnosis not present

## 2014-10-31 DIAGNOSIS — R945 Abnormal results of liver function studies: Principal | ICD-10-CM

## 2014-10-31 DIAGNOSIS — I25110 Atherosclerotic heart disease of native coronary artery with unstable angina pectoris: Secondary | ICD-10-CM | POA: Diagnosis present

## 2014-10-31 DIAGNOSIS — D649 Anemia, unspecified: Secondary | ICD-10-CM

## 2014-10-31 DIAGNOSIS — F17210 Nicotine dependence, cigarettes, uncomplicated: Secondary | ICD-10-CM | POA: Diagnosis present

## 2014-10-31 DIAGNOSIS — Z6837 Body mass index (BMI) 37.0-37.9, adult: Secondary | ICD-10-CM | POA: Diagnosis not present

## 2014-10-31 DIAGNOSIS — M545 Low back pain: Secondary | ICD-10-CM | POA: Diagnosis present

## 2014-10-31 DIAGNOSIS — E669 Obesity, unspecified: Secondary | ICD-10-CM | POA: Diagnosis present

## 2014-10-31 DIAGNOSIS — D72819 Decreased white blood cell count, unspecified: Secondary | ICD-10-CM | POA: Diagnosis present

## 2014-10-31 DIAGNOSIS — L409 Psoriasis, unspecified: Secondary | ICD-10-CM | POA: Diagnosis present

## 2014-10-31 DIAGNOSIS — D693 Immune thrombocytopenic purpura: Secondary | ICD-10-CM | POA: Diagnosis present

## 2014-10-31 DIAGNOSIS — I252 Old myocardial infarction: Secondary | ICD-10-CM | POA: Diagnosis not present

## 2014-10-31 DIAGNOSIS — E785 Hyperlipidemia, unspecified: Secondary | ICD-10-CM | POA: Diagnosis present

## 2014-10-31 DIAGNOSIS — R51 Headache: Secondary | ICD-10-CM | POA: Diagnosis present

## 2014-10-31 DIAGNOSIS — Z955 Presence of coronary angioplasty implant and graft: Secondary | ICD-10-CM | POA: Diagnosis not present

## 2014-10-31 LAB — CBC WITH DIFFERENTIAL/PLATELET
Basophils Absolute: 0 10*3/uL (ref 0.0–0.1)
Basophils Relative: 0 % (ref 0–1)
Eosinophils Absolute: 0.1 10*3/uL (ref 0.0–0.7)
Eosinophils Relative: 2 % (ref 0–5)
HCT: 34.4 % — ABNORMAL LOW (ref 39.0–52.0)
HEMOGLOBIN: 11.4 g/dL — AB (ref 13.0–17.0)
Lymphocytes Relative: 26 % (ref 12–46)
Lymphs Abs: 1.3 10*3/uL (ref 0.7–4.0)
MCH: 31.1 pg (ref 26.0–34.0)
MCHC: 33.1 g/dL (ref 30.0–36.0)
MCV: 94 fL (ref 78.0–100.0)
MONOS PCT: 9 % (ref 3–12)
Monocytes Absolute: 0.5 10*3/uL (ref 0.1–1.0)
NEUTROS ABS: 3 10*3/uL (ref 1.7–7.7)
NEUTROS PCT: 63 % (ref 43–77)
PLATELETS: 47 10*3/uL — AB (ref 150–400)
RBC: 3.66 MIL/uL — ABNORMAL LOW (ref 4.22–5.81)
RDW: 14.5 % (ref 11.5–15.5)
WBC: 4.8 10*3/uL (ref 4.0–10.5)

## 2014-10-31 LAB — CBC
HCT: 34.9 % — ABNORMAL LOW (ref 39.0–52.0)
Hemoglobin: 11.7 g/dL — ABNORMAL LOW (ref 13.0–17.0)
MCH: 31.6 pg (ref 26.0–34.0)
MCHC: 33.5 g/dL (ref 30.0–36.0)
MCV: 94.3 fL (ref 78.0–100.0)
PLATELETS: 51 10*3/uL — AB (ref 150–400)
RBC: 3.7 MIL/uL — AB (ref 4.22–5.81)
RDW: 14.7 % (ref 11.5–15.5)
WBC: 4.4 10*3/uL (ref 4.0–10.5)

## 2014-10-31 SURGERY — LEFT HEART CATHETERIZATION WITH CORONARY ANGIOGRAM
Anesthesia: LOCAL

## 2014-10-31 MED ORDER — NITROGLYCERIN 0.4 MG SL SUBL: 0.4000 mg | SUBLINGUAL_TABLET | SUBLINGUAL | Status: DC | PRN

## 2014-10-31 MED ORDER — METOPROLOL SUCCINATE ER 25 MG PO TB24
12.5000 mg | ORAL_TABLET | Freq: Every day | ORAL | Status: DC
  Administered 2014-10-31: 12.5 mg via ORAL
  Filled 2014-10-31: qty 1

## 2014-10-31 MED ORDER — GUAIFENESIN-DM 100-10 MG/5ML PO SYRP
5.0000 mL | ORAL_SOLUTION | ORAL | Status: DC | PRN
  Administered 2014-10-31: 5 mL via ORAL
  Filled 2014-10-31: qty 5

## 2014-10-31 MED ORDER — PANTOPRAZOLE SODIUM 40 MG PO TBEC: 40.0000 mg | DELAYED_RELEASE_TABLET | Freq: Every day | ORAL | Status: DC

## 2014-10-31 MED ORDER — PREDNISONE 20 MG PO TABS: 80.0000 mg | ORAL_TABLET | Freq: Every day | ORAL | Status: DC

## 2014-10-31 MED ORDER — ATORVASTATIN CALCIUM 40 MG PO TABS: 40.0000 mg | ORAL_TABLET | Freq: Every evening | ORAL | Status: DC

## 2014-10-31 MED ORDER — ASPIRIN 81 MG PO CHEW
81.0000 mg | CHEWABLE_TABLET | Freq: Every day | ORAL | Status: DC
  Filled 2014-10-31: qty 1

## 2014-10-31 MED ORDER — METOPROLOL SUCCINATE ER 25 MG PO TB24: 12.5000 mg | ORAL_TABLET | Freq: Every day | ORAL | Status: DC

## 2014-10-31 MED ORDER — ASPIRIN 81 MG PO TABS: 81.0000 mg | ORAL_TABLET | Freq: Every day | ORAL | Status: DC

## 2014-10-31 NOTE — Progress Notes (Signed)
First dose IVIG completed without any complications. Vitals WNL. Will continue to monitor pt  Prescilla Sours, RN

## 2014-10-31 NOTE — Progress Notes (Signed)
Pt discharged home with wife.  Reviewed discharge instructions and education, all questions answered.  Assessment unchanged from earlier.  

## 2014-10-31 NOTE — Progress Notes (Signed)
Chaplain responded to page from pt nurse about an advanced directive. Chaplain worked with pt and family to complete and notarize. Copy placed in pt chart at pt request. Nurse informed.    10/31/14 1500  Clinical Encounter Type  Visited With Patient and family together;Health care provider  Visit Type Initial;Spiritual support  Referral From Nurse;Family  Advance Directives (For Healthcare)  Does patient have an advance directive? Yes  Vincent Ochoa, Barbette Hair, Chaplain 10/31/2014 3:11 PM

## 2014-10-31 NOTE — Progress Notes (Signed)
Vincent Ochoa   DOB:Apr 17, 1956   SW#:[id number]   GYK#:[id number]  Patient Care Team: Burnice Logan, MD as PCP - General (Internal Medicine) I have seen the patient, examined him and edited the notes as follows  Subjective: Patient seen and examined. He he has minimal  episodes of chest pain controlled with nitroglycerin, without significant shortness of breath. He complains of productive, dark brown cough. He had IVIG on 12/10, and to receive dose 2 today. Denies hemoptysis. Minimal epistaxis reported when forcefully blowing his nose. Denies hematemesis, blood in stools or in urine.Denies pruritus. Has not ambulated since admission due to symptoms. Appetite normal. No neurological changes reported.  Scheduled Meds: . aspirin  81 mg Oral Daily  . atorvastatin  40 mg Oral q1800  . IMMUNE GLOBULIN 10% (HUMAN) IV - For Fluid Restriction Only  1 g/kg Intravenous Daily  . metoprolol succinate  12.5 mg Oral Daily  . pantoprazole  40 mg Oral Daily  . predniSONE  80 mg Oral Q breakfast  . sodium chloride  3 mL Intravenous Q12H  . sodium chloride  3 mL Intravenous Q12H   Continuous Infusions: . sodium chloride 100 mL/hr at 10/31/14 0400   PRN Meds:sodium chloride, sodium chloride, acetaminophen, diphenhydrAMINE, guaiFENesin-dextromethorphan, nitroGLYCERIN, ondansetron (ZOFRAN) IV, sodium chloride, sodium chloride   Objective:  Filed Vitals:   10/31/14 0601  BP: 105/42  Pulse: 79  Temp: 98.1 F (36.7 C)  Resp:       Intake/Output Summary (Last 24 hours) at 10/31/14 0941 Last data filed at 10/31/14 0933  Gross per 24 hour  Intake    600 ml  Output    675 ml  Net    -75 ml    ECOG PERFORMANCE STATUS:2   GENERAL: alert, no distress and comfortable. He is morbidly obese SKIN: skin color, texture, turgor are normal. No new open lesions, but visible areas of psoriasis are seen on the right dorsal aspect of his hand and on his elbows. He has several tattoos EYES: normal, conjunctiva are  pink and non-injected, sclera clear OROPHARYNX: no exudate, no erythema and lips, buccal mucosa, and tongue normal NECK: no palpable lymphadenopathy in the cervical, axillary or inguinal area LUNGS: clear to auscultation and percussion with normal breathing effort HEART: regular rate & rhythm and no murmurs and 1+ bilateral extremity edema, Left greater than right ABDOMEN:abdomen Obese, protuberant, Tender on the left upper quadrant, cannot appreciate hepatosplenomegaly due to body habitus,normal bowel sounds Musculoskeletal:no cyanosis of digits and no clubbing. Nails brittle.  PSYCH: alert & oriented x 3 with fluent speech NEURO: no focal motor/sensory deficits He has bilateral gynecomastia   CBG (last 3)  No results for input(s): GLUCAP in the last 72 hours.   Labs:   Recent Labs Lab 10/28/14 2255 10/29/14 0455 10/30/14 0622 10/30/14 1200 10/30/14 1521 10/31/14 0348  WBC 3.9* 3.9* 4.6 6.0 5.7 4.8  HGB 12.7* 12.7* 12.9* 13.5 12.7* 11.4*  HCT 37.5* 38.1* 38.8* 39.9 37.8* 34.4*  PLT 46* 49* 48* 51* 47* 47*  MCV 93.1 93.8 93.5 93.7 93.8 94.0  MCH 31.5 31.3 31.1 31.7 31.5 31.1  MCHC 33.9 33.3 33.2 33.8 33.6 33.1  RDW 14.3 14.4 14.0 14.1 14.1 14.5  LYMPHSABS 1.5  --  0.8 0.8 0.9 1.3  MONOABS 0.4  --  0.5 0.2 0.4 0.5  EOSABS 0.2  --  0.0 0.0 0.0 0.1  BASOSABS 0.0  --  0.0 0.0 0.0 0.0     Chemistries:    Recent Labs  Lab 10/28/14 1645 10/28/14 2255 10/29/14 0455  NA 143 139 142  K 4.0 3.9 4.0  CL 105 103 106  CO2 _0 GLUCOSE 96 122* 104*  BUN _1 CREATININE 0.70 0.75 0.80  CALCIUM 9.1 9.1 8.6  MG  --  1.7  --   AST 73* 69*  --   ALT 66* 65*  --   ALKPHOS 81 77  --   BILITOT 1.3* 1.7*  --     GFR Estimated Creatinine Clearance: 152.6 mL/min (by C-G formula based on Cr of 0.8).  Liver Function Tests:  Recent Labs Lab 10/28/14 1645 10/28/14 2255  AST 73* 69*  ALT 66* 65*  ALKPHOS 81 77  BILITOT 1.3* 1.7*  PROT 7.2 6.9  ALBUMIN 3.3* 3.4*    Coagulation profile  Recent Labs Lab 10/28/14 2255 10/29/14 0455  INR 1.29 1.28    Cardiac Enzymes:  Recent Labs Lab 10/28/14 1645 10/28/14 2255 10/29/14 0455 10/29/14 1330 10/30/14 1259  TROPONINI <0.30 <0.30 <0.30 <0.30 <0.30      Imaging Studies:  Nm Myocar Multi W/spect W/wall Motion / Ef  10/30/2014   ADDENDUM REPORT: 10/30/2014 14:38  ADDENDUM: Given the fixed defect and inducible portions in the setting of left ventricular dilation, this study represents a high risk stress test finding.   Electronically Signed   By: Lawrence Santiago M.D.   On: 10/30/2014 14:38   10/30/2014   CLINICAL DATA:  Chest pain.  Coronary artery disease.  EXAM: MYOCARDIAL IMAGING WITH SPECT (REST AND PHARMACOLOGIC-STRESS - 2 DAY PROTOCOL)  GATED LEFT VENTRICULAR WALL MOTION STUDY  LEFT VENTRICULAR EJECTION FRACTION  TECHNIQUE: Standard myocardial SPECT imaging was performed after resting intravenous injection of 30 mCi Tc-69msestamibi. Subsequently, on a second day, intravenous infusion of Lexiscan was performed under the supervision of the Cardiology staff. At peak effect of the drug, 30 mCi [id number] was injected intravenously and standard myocardial SPECT imaging was performed. Quantitative gated imaging was also performed to evaluate left ventricular wall motion, and estimate left ventricular ejection fraction.  COMPARISON:  Two-view chest x-ray 10/28/2014. Nuclear medicine cardiac perfusion exam 07/18/2009.  FINDINGS: Perfusion: There is decreased uptake on both rest and stress images within the inferolateral aspect of the left ventricle. This is slightly worse on the stress images. Mild apical thinning is present without reversibility.  Wall Motion: Wall motion is somewhat hypokinetic but symmetric.  Left Ventricular Ejection Fraction: 50 %  End diastolic volume 22544l  End systolic volume 11920l  IMPRESSION: 1. 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  2. Mild hypokinesis.  3. Left ventricular ejection fraction 50%  4. Intermediate-risk stress test findings*.  *2012 Appropriate Use Criteria for Coronary Revascularization Focused Update: J Am Coll Cardiol. 201007;12(1):975-883http://[email or web address]?articleid=1201161  Electronically Signed: By: ChLawrence Santiago.D. On: 10/30/2014 14:29    Assessment/Plan: [age].o.  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 There is a component of thrombocytopenia related to liver disease. The mild leukopenia could be associated with recent infection causing bone marrow suppression or autoimmune in nature. Today his numbers are normal, although he is on prednisone which could affect results. He had history of hepatitis. His last hepatitis panel on 12/9 was negative. HIV is negative.  Patient  have occasional nosebleeds, but suspect is not due to low platelet but rather due to forcefully sneezing.  He was started on prednisone on 12/9 as mentioned above. Due to suboptimal response and need for urgent cardiac catheterization, IVIG was recommended. The risks, benefits, side effects of IVIG were discussed with the patient and his wife and they agreed to proceed. He had his first  IVIG dose on 12/10 and second dose today. Will follow CBC closely. B12 is 612. ANA is negative. Sed Rate is normal at 8 and Rheumatoid Factor is negative If the cardiac catheterization is urgent, would recommend platelet transfusion. There is no indication to proceed with bone marrow biopsy.  Chest pain in the setting of chronic coronary artery disease The patient presented with chest pain concerning for underlying coronary ischemia.  Myoview scan showed mild reversibility  surrounding a more chronic infarct in the inferolateral wall suspicious for ischemia adjacent to a more chronic infarct; mild hypokinesis Ejection fraction 50 %. These findings represent a high risk stress test Will defer plans for cardiac catheterization to Cardiology service. Likely to proceed with cath this afternoon.  He is currently on maximal medical management.  Anemia Likely of chronic disease and dilution. No transfusion is indicated at this time  Full Code  Discharge planning OK to discharge for further work-up and treatment of ITP as out-patient. I discussed the risks and benefits of aspirin. Benefits outweighed the risk. Recommend proceed with 81 mg aspirin daily. Continue prednisone 80 mg daily and return to clinic to see me 12/15.  Other medical issues as per admitting team   **Disclaimer: This note was dictated with voice recognition software. Similar sounding words can inadvertently be transcribed and this note may contain transcription errors which may not have been corrected upon publication of note.Sharene Butters E, PA-C 10/31/2014  9:41 AM Aarit Kashuba, MD 10/31/2014

## 2014-10-31 NOTE — Progress Notes (Signed)
IVIG infusion complete, VSS, no complaints from patient.  Will continue to monitor.

## 2014-10-31 NOTE — Discharge Summary (Signed)
Discharge Summary   Patient ID: GEOFF DACANAY MRN: [id number], DOB/AGE: December 13, 1955 58 y.o. Admit date: 10/28/2014 D/C date:     10/31/2014  Primary Care Provider: Jeb Levering, Philbert Riser, MD Primary Cardiologist: Stanford Breed remotely Hematologist: Alvy Bimler   Primary Discharge Diagnoses:  1. Chest pain concerning for angina with abnormal stress test 2. H/o nonobstructive CAD in 2007 3. 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 Hyperlipidemia 5. Psoriasis 6. Obesity Body mass index is 37.03 kg/(m^2).  7. Elevated LFTs with history of such  Secondary Discharge Diagnoses:  1. GERD 2. Frequent headaches 3. Lumbosacral back pain 4. Fatty liver disease on [place] 2012  Hospital Course: Mr. Boettner is a 58 y/o M with history of nonobstructive CAD, thrombocytopenia, HLD, psoriasis, obesity who presented to [facility] 10/28/2014 with chest pain. He was admitted in 2007 with chest pain and had an abnormal nuclear study; enzymes negative. He subsequently underwent cardiac catheterization by Dr. Tamala Julian which revealed EF 60%, ostial LM less than 30% narrowing; no significant LAD disease; 30-40% OM; the right coronary artery contained mid 30-40% stenosis with suggestion of possible ruptured plaque, but there was no flow-limiting lesion noted.Medical therapy was recommended. Echo in 2007 showed an EF of 50%. Patient apparently had a cardiac catheterization at [facility] in 2008. No intervention.He then had return of CP and was admitted to [facility] and apparently ruled out. A stress test was normal by his report. He also had evidence of chronic thrombocytopenia at least to 2012 with unclear onset. Prior abdominal US showed fatty liver disease.  For the past week he has had intermittent chest pressure associated with SOB.While at work he developed jaw pain that radiated into his chest that was a pressure and heaviness similar to his prior cardiac pain.This had been going on about  a month, relieved with NTG. Most episodes occurr with stress.He has also had some associated SOB, nausea, and diaphoresis. The CP woke him up the other night.He had to take 2 SL NTG for resolution of the pain. He also has had 2-3 pillow orthopnea along with PND. He was pain free in the ER at [facility] and was transferred to [facility] for evaluation where he also was pain-free. EKG:NSR with septal infarct and nonspecific ST abnormality (since last tracing nonspecific ST abnormality is new). He did complain of a productive cough (no hemoptysis) but CXR nonacute and he was afebrile.  In light of his significant thrombocytopenia, the team did not initially jump right to cath. He underwent 2-day nuclear stress testing initially along with heme consult. The stress test showed: "1. Mild reversibility surrounding a more chronic infarct in the inferolateral wall, suspicious for ischemia adjacent to a more chronic infarct. 2. Mild hypokinesis. 3. Left ventricular ejection fraction 50%." Given the fixed defect and inducible portions in the setting of left ventricular dilation, this study represents a high risk stress test finding. The next step ideally would be cardiac cath, however, his platelets remained a concern due to risk of bleeding on potential DAPT therapy. Dr. 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 She felt there is a component of thrombocytopenia related to liver disease and that his mild leukopenia could be associated with recent infection causing bone marrow suppression or autoimmune in nature. He has recurrent nosebleeds, but suspected due to forcefully sneezing rather than low platelets. She started him on prednisone. He had  no significant change in platelet count the following day. Thus she initiated IVIG therapy.  In the interim while being treated, a 2D echo  showed EF 60-65%, grade 2/2, mod dilated LA, normal RV function, mildly dilated RA, mild TR. He also had a negative ANA, RF, hepatitis panel, TSH, sed rate. Troponins remained negative. He was only able to tolerate low dose Toprol due to HR in the 50's.   Dr. Burt Knack and Dr. Alvy Bimler discussed the plan. One option was to do diagnostic cath only for now, but Dr. Alvy Bimler preferred the patient not undergo two invasive procedures if he would end up requiring a second for PCI. A 3 pm platelet count was drawn today after a second IVIG treatment in hopes of seeing significant improvement but it only went from 47 to 51k. Ultimately the plan is for discharge home from the hospital with close hematology followup. Dr. Alvy Bimler will be seeing the patient in clinic early next week and has told Dr. Burt Knack that she will be in communication with him about timing of appropriateness for cardiac catheterization. In the meantime he will continue aspirin. We have verbally instructed him not to drive or return to work and this is on his paperwork as well. Dr. Burt Knack has seen and examined the patient today and feels he is stable for discharge.  LDL was 115 on arrival thus he was started on Lipitor. Baseline AST/ALT 69/65. Dr. Burt Knack was OK with the patient continuing Lipitor at discharge. He will need follow-up LFTs in 4 weeks - we have asked him to go to [place] [facility] in about 4 weeks (~11/28/14) and an order was placed in the system. Dr. Alvy Bimler recommends to continue prednisone 80mg  daily for now (2 week rx provided). She told him she will see him back in the office on Tuesday 12/15 and her office will call the patient with confirmation of the time early next week. He will also be continued on PPI due to the high dose prednisone and baby aspirin. We have chosen Protonix in case we end up placing him on Plavix.  Discharge Vitals: Blood pressure 139/87, pulse 57, temperature 98 F (36.7 C), temperature source Oral, resp. rate 18, height  6\' 4"  (1.93 m), weight 304 lb 1.6 oz (137.939 kg), SpO2 94 %.  Labs: Lab Results  Component Value Date   WBC 4.4 10/31/2014   HGB 11.7* 10/31/2014   HCT 34.9* 10/31/2014   MCV 94.3 10/31/2014   PLT 51* 10/31/2014     Recent Labs Lab 10/28/14 2255 10/29/14 0455  NA 139 142  K 3.9 4.0  CL 103 106  CO2 24 25  BUN 9 10  CREATININE 0.75 0.80  CALCIUM 9.1 8.6  PROT 6.9  --   BILITOT 1.7*  --   ALKPHOS 77  --   ALT 65*  --   AST 69*  --   GLUCOSE 122* 104*    Recent Labs  10/28/14 2255 10/29/14 0455 10/29/14 1330 10/30/14 1259  TROPONINI <0.30 <0.30 <0.30 <0.30   Lab Results  Component Value Date   CHOL 174 10/29/2014   HDL 24* 10/29/2014   LDLCALC 115* 10/29/2014   TRIG 174* 10/29/2014    Diagnostic Studies/Procedures   2D echo 10/29/14 - Left ventricle: The cavity size was mildly dilated. Systolic function was normal. The estimated ejection fraction was in the range of 60% to 65%. Wall motion was normal; there were no regional wall motion abnormalities. Features are consistent with a pseudonormal left ventricular  filling pattern, with concomitant abnormal relaxation and increased filling pressure (grade 2 diastolic dysfunction). Doppler parameters are consistent with elevated ventricular end-diastolic filling pressure. - Aortic valve: Trileaflet; normal thickness leaflets. There was no regurgitation. - Aortic root: The aortic root was normal in size. - Mitral valve: Structurally normal valve. - Left atrium: The atrium was moderately dilated. - Right ventricle: Systolic function was normal. - Right atrium: The atrium was mildly dilated. - Tricuspid valve: There was mild regurgitation. - Pulmonic valve: There was no regurgitation. - Pulmonary arteries: Systolic pressure was within the normal range. - Inferior vena cava: The vessel was normal in size. - Pericardium, extracardiac: There was no pericardial effusion.  Dg Chest 2  View  10/28/2014   CLINICAL DATA:  Left upper chest pain for 2 weeks  EXAM: CHEST  2 VIEW  COMPARISON:  03/17/2010  FINDINGS: Cardiac shadow is within normal limits. Lungs are well aerated bilaterally. No focal infiltrate or effusion is noted. No acute bony abnormality is seen.  IMPRESSION: No acute abnormality noted.   Electronically Signed   By: Inez Catalina M.D.   On: 10/28/2014 17:25   Nm Myocar Multi W/spect W/wall Motion / Ef  10/30/2014   ADDENDUM REPORT: 10/30/2014 14:38  ADDENDUM: Given the fixed defect and inducible portions in the setting of left ventricular dilation, this study represents a high risk stress test finding.   Electronically Signed   By: Lawrence Santiago M.D.   On: 10/30/2014 14:38   10/30/2014   CLINICAL DATA:  Chest pain.  Coronary artery disease.  EXAM: MYOCARDIAL IMAGING WITH SPECT (REST AND PHARMACOLOGIC-STRESS - 2 DAY PROTOCOL)  GATED LEFT VENTRICULAR WALL MOTION STUDY  LEFT VENTRICULAR EJECTION FRACTION  TECHNIQUE: Standard myocardial SPECT imaging was performed after resting intravenous injection of 30 mCi Tc-71m sestamibi. Subsequently, on a second day, intravenous infusion of Lexiscan was performed under the supervision of the Cardiology staff. At peak effect of the drug, 30 mCi Tc-32m sestamibi was injected intravenously and standard myocardial SPECT imaging was performed. Quantitative gated imaging was also performed to evaluate left ventricular wall motion, and estimate left ventricular ejection fraction.  COMPARISON:  Two-view chest x-ray 10/28/2014. Nuclear medicine cardiac perfusion exam 07/18/2009.  FINDINGS: Perfusion: There is decreased uptake on both rest and stress images within the inferolateral aspect of the left ventricle. This is slightly worse on the stress images. Mild apical thinning is present without reversibility.  Wall Motion: Wall motion is somewhat hypokinetic but symmetric.  Left Ventricular Ejection Fraction: 50 %  End diastolic volume 259 ml  End  systolic volume 563 ml  IMPRESSION: 1. Mild reversibility surrounding a more chronic infarct in the inferolateral wall, suspicious for ischemia adjacent to a more chronic infarct.  2. Mild hypokinesis.  3. Left ventricular ejection fraction 50%  4. Intermediate-risk stress test findings*.  *2012 Appropriate Use Criteria for Coronary Revascularization Focused Update: J Am Coll Cardiol. 8756;43(3):295-188. http://content.airportbarriers.com.aspx?articleid=1201161  Electronically Signed: By: Lawrence Santiago M.D. On: 10/30/2014 14:29   Addenda:       ADDENDUM REPORT: 10/30/2014 14:38  ADDENDUM: Given the fixed defect and inducible portions in the setting of left ventricular dilation, this study represents a high risk stress test finding.   Electronically Signed By: Lawrence Santiago M.D. On: 10/30/2014 14:38      Discharge Medications   Current Discharge Medication List    START taking these medications   Details  atorvastatin (LIPITOR) 40 MG tablet Take 1 tablet (40 mg total) by mouth every evening. Qty:  30 tablet, Refills: 1    metoprolol succinate (TOPROL-XL) 25 MG 24 hr tablet Take 0.5 tablets (12.5 mg total) by mouth daily. Qty: 30 tablet, Refills: 1    nitroGLYCERIN (NITROSTAT) 0.4 MG SL tablet Place 1 tablet (0.4 mg total) under the tongue every 5 (five) minutes as needed for chest pain (up to 3 doses). Qty: 25 tablet, Refills: 3    pantoprazole (PROTONIX) 40 MG tablet Take 1 tablet (40 mg total) by mouth daily. Qty: 30 tablet, Refills: 1   Associated Diagnoses: Thrombocytopenia    predniSONE (DELTASONE) 20 MG tablet Take 4 tablets (80 mg total) by mouth daily with breakfast. Qty: 56 tablet, Refills: 0   Associated Diagnoses: Thrombocytopenia      CONTINUE these medications which have CHANGED   Details  aspirin 81 MG tablet Take 1 tablet (81 mg total) by mouth daily.        Disposition   The patient will be discharged in stable condition to home. Discharge  Instructions    Diet - low sodium heart healthy    Complete by:  As directed      Other Restrictions    Complete by:  As directed   Dr. Burt Knack wants you to take it easy at home for now. No driving for now. You may not return to work until cleared by your cardiologist. If pain returns, worsens or you develop any concerning symptoms, please seek medical attention and return to the ER.          Follow-up Information    Follow up with [facility], NI, MD.   Specialty:  Hematology and Oncology   Why:  Dr. Alvy Bimler is scheduling an appointment for you on Tuesday. Her office will be in touch early next week for final confirmation of the time.   Contact information:   [place] [id number] [phone]       Follow up with Kirk Ruths, MD.   Specialty:  Cardiology   Why:  Dr. Wilber Oliphant office will be in touch with Dr. Alvy Bimler about the timing of your heart catheterization, and they will let you know the plan.   Contact information:   [address] [place] [address] [phone]       Follow up with [facility].   Why:  Please go to [facility] labs in about 4 weeks (~11/28/14) for repeat check of your liver function since you were started on cholesterol medication. We have placed a standing order in their system for this to be performed.   Contact information:   [address], [address] [place], [place] [address] [phone] (Phone) (Fax) 8:00am-5:00pm (1st floor of the building Dr. Stanford Breed is in, near [facility])        Duration of Discharge Encounter: Greater than 30 minutes including physician and PA time.  Signed, Melina Copa PA-C 10/31/2014, 5:34 PM

## 2014-10-31 NOTE — Progress Notes (Signed)
UR completed 

## 2014-10-31 NOTE — Progress Notes (Signed)
    Subjective:  No chest pain or dyspnea overnight. Patient is frustrated with still being in hospital. Didn't sleep well last night.  Objective:  Vital Signs in the last 24 hours: Temp:  [97.8 F (36.6 C)-98.2 F (36.8 C)] 98.1 F (36.7 C) (12/11 0601) Pulse Rate:  [57-81] 79 (12/11 0601) Resp:  [18] 18 (12/10 1952) BP: (105-136)/(39-64) 105/42 mmHg (12/11 0601) SpO2:  [92 %-97 %] 97 % (12/11 0601) Weight:  [304 lb 1.6 oz (137.939 kg)] 304 lb 1.6 oz (137.939 kg) (12/11 0601)  Intake/Output from previous day: 12/10 0701 - 12/11 0700 In: 120 [P.O.:120] Out: 325 [Urine:325]  Physical Exam: Pt is alert and oriented, NAD HEENT: normal Neck: JVP - normal Lungs: CTA bilaterally CV: RRR without murmur or gallop Abd: soft, NT, obese Ext: no C/C/E, distal pulses intact and equal Skin: warm/dry no rash   Lab Results:  Recent Labs  10/30/14 1521 10/31/14 0348  WBC 5.7 4.8  HGB 12.7* 11.4*  PLT 47* 47*    Recent Labs  10/28/14 2255 10/29/14 0455  NA 139 142  K 3.9 4.0  CL 103 106  CO2 24 25  GLUCOSE 122* 104*  BUN 9 10  CREATININE 0.75 0.80    Recent Labs  10/29/14 1330 10/30/14 1259  TROPONINI <0.30 <0.30    Cardiac Studies: Myoview scan: IMPRESSION: 1. Mild reversibility surrounding a more chronic infarct in the inferolateral wall, suspicious for ischemia adjacent to a more chronic infarct.  2. Mild hypokinesis.  3. Left ventricular ejection fraction 50%  ADDENDUM: Given the fixed defect and inducible portions in the setting of left ventricular dilation, this study represents a high risk stress test finding.  Assessment/Plan:  1. Accelerating angina 2. Thrombocytopenia 3. Hyperlipidemia 4. High risk Myoview scan  Plans for cardiac catheterization and possible PCI as outlined. Platelet count remains less than 50,000 after first treatment with IVIG. I will discuss his case further with Dr. Alvy Bimler. Options include cardiac catheterization  today with platelet transfusion versus continued treatment to increase his platelet count so that cardiac catheterization can be performed safely at lower bleeding risk. One consideration would be a diagnostic only cardiac catheterization today for radial access to define his anatomy. Discussed plan with patient. He is scheduled for a second IVIG treatment this morning and probably will follow-up his platelet count this afternoon to see if we can proceed with cardiac catheterization today.  Sherren Mocha, M.D. 10/31/2014, 8:19 AM

## 2014-10-31 NOTE — Progress Notes (Signed)
IVIG started and pt called out complaining of itching at IV site.  Upon assessment, the IV site was not red.  Pt received 50mg  PO benadryl.  Returned 15 minutes later and pt stated that IV site was no longer itching.  Dr. Alvy Bimler notified and IV team notified.  No new orders received.  Will continue to monitor closely.

## 2014-11-03 ENCOUNTER — Encounter (HOSPITAL_COMMUNITY): Payer: Self-pay | Admitting: Emergency Medicine

## 2014-11-03 ENCOUNTER — Emergency Department (HOSPITAL_COMMUNITY): Payer: Medicaid Other

## 2014-11-03 ENCOUNTER — Inpatient Hospital Stay (HOSPITAL_COMMUNITY)
Admission: EM | Admit: 2014-11-03 | Discharge: 2014-11-05 | DRG: 282 | Disposition: A | Discharge status: Home / Self Care | Payer: Medicaid Other | Attending: Cardiology | Admitting: Cardiology

## 2014-11-03 DIAGNOSIS — K760 Fatty (change of) liver, not elsewhere classified: Secondary | ICD-10-CM

## 2014-11-03 DIAGNOSIS — E669 Obesity, unspecified: Secondary | ICD-10-CM

## 2014-11-03 DIAGNOSIS — R0602 Shortness of breath: Secondary | ICD-10-CM | POA: Diagnosis not present

## 2014-11-03 DIAGNOSIS — R079 Chest pain, unspecified: Secondary | ICD-10-CM

## 2014-11-03 DIAGNOSIS — L409 Psoriasis, unspecified: Secondary | ICD-10-CM

## 2014-11-03 DIAGNOSIS — I2510 Atherosclerotic heart disease of native coronary artery without angina pectoris: Secondary | ICD-10-CM

## 2014-11-03 DIAGNOSIS — F17210 Nicotine dependence, cigarettes, uncomplicated: Secondary | ICD-10-CM | POA: Diagnosis present

## 2014-11-03 DIAGNOSIS — I214 Non-ST elevation (NSTEMI) myocardial infarction: Secondary | ICD-10-CM | POA: Diagnosis present

## 2014-11-03 DIAGNOSIS — I4891 Unspecified atrial fibrillation: Secondary | ICD-10-CM | POA: Diagnosis not present

## 2014-11-03 DIAGNOSIS — Z7982 Long term (current) use of aspirin: Secondary | ICD-10-CM | POA: Diagnosis not present

## 2014-11-03 DIAGNOSIS — Z6837 Body mass index (BMI) 37.0-37.9, adult: Secondary | ICD-10-CM | POA: Diagnosis not present

## 2014-11-03 DIAGNOSIS — Z955 Presence of coronary angioplasty implant and graft: Secondary | ICD-10-CM

## 2014-11-03 DIAGNOSIS — I480 Paroxysmal atrial fibrillation: Secondary | ICD-10-CM | POA: Diagnosis present

## 2014-11-03 DIAGNOSIS — R945 Abnormal results of liver function studies: Secondary | ICD-10-CM

## 2014-11-03 DIAGNOSIS — Z7952 Long term (current) use of systemic steroids: Secondary | ICD-10-CM | POA: Diagnosis not present

## 2014-11-03 DIAGNOSIS — D6959 Other secondary thrombocytopenia: Secondary | ICD-10-CM | POA: Diagnosis present

## 2014-11-03 DIAGNOSIS — I200 Unstable angina: Secondary | ICD-10-CM

## 2014-11-03 DIAGNOSIS — K649 Unspecified hemorrhoids: Secondary | ICD-10-CM

## 2014-11-03 DIAGNOSIS — D696 Thrombocytopenia, unspecified: Secondary | ICD-10-CM

## 2014-11-03 DIAGNOSIS — K219 Gastro-esophageal reflux disease without esophagitis: Secondary | ICD-10-CM | POA: Diagnosis present

## 2014-11-03 DIAGNOSIS — E785 Hyperlipidemia, unspecified: Secondary | ICD-10-CM

## 2014-11-03 DIAGNOSIS — I25110 Atherosclerotic heart disease of native coronary artery with unstable angina pectoris: Secondary | ICD-10-CM | POA: Diagnosis not present

## 2014-11-03 DIAGNOSIS — R7989 Other specified abnormal findings of blood chemistry: Secondary | ICD-10-CM | POA: Diagnosis present

## 2014-11-03 HISTORY — DX: Bradycardia, unspecified: R00.1

## 2014-11-03 LAB — COMPREHENSIVE METABOLIC PANEL
ALBUMIN: 3 g/dL — AB (ref 3.5–5.2)
ALT: 64 U/L — ABNORMAL HIGH (ref 0–53)
ANION GAP: 12 (ref 5–15)
AST: 64 U/L — ABNORMAL HIGH (ref 0–37)
Alkaline Phosphatase: 72 U/L (ref 39–117)
BILIRUBIN TOTAL: 1.7 mg/dL — AB (ref 0.3–1.2)
BUN: 20 mg/dL (ref 6–23)
CHLORIDE: 104 meq/L (ref 96–112)
CO2: 23 mEq/L (ref 19–32)
Calcium: 8.8 mg/dL (ref 8.4–10.5)
Creatinine, Ser: 0.77 mg/dL (ref 0.50–1.35)
GFR calc Af Amer: >90 mL/min (ref >90)
Glucose, Bld: 132 mg/dL — ABNORMAL HIGH (ref 70–99)
Potassium: 4.3 mEq/L (ref 3.7–5.3)
Sodium: 139 mEq/L (ref 137–147)
Total Protein: 9.1 g/dL — ABNORMAL HIGH (ref 6.0–8.3)

## 2014-11-03 LAB — CBC WITH DIFFERENTIAL/PLATELET
Basophils Absolute: 0 10*3/uL (ref 0.0–0.1)
Basophils Relative: 0 % (ref 0–1)
Eosinophils Absolute: 0 10*3/uL (ref 0.0–0.7)
Eosinophils Relative: 1 % (ref 0–5)
HCT: 35.7 % — ABNORMAL LOW (ref 39.0–52.0)
Hemoglobin: 12.2 g/dL — ABNORMAL LOW (ref 13.0–17.0)
Lymphocytes Relative: 24 % (ref 12–46)
Lymphs Abs: 1.3 10*3/uL (ref 0.7–4.0)
MCH: 31.4 pg (ref 26.0–34.0)
MCHC: 34.2 g/dL (ref 30.0–36.0)
MCV: 92 fL (ref 78.0–100.0)
Monocytes Absolute: 0.5 10*3/uL (ref 0.1–1.0)
Monocytes Relative: 10 % (ref 3–12)
Neutro Abs: 3.5 10*3/uL (ref 1.7–7.7)
Neutrophils Relative %: 65 % (ref 43–77)
Platelets: 57 10*3/uL — ABNORMAL LOW (ref 150–400)
RBC: 3.88 MIL/uL — ABNORMAL LOW (ref 4.22–5.81)
RDW: 14.6 % (ref 11.5–15.5)
WBC: 5.3 10*3/uL (ref 4.0–10.5)

## 2014-11-03 LAB — URINALYSIS, ROUTINE W REFLEX MICROSCOPIC
Bilirubin Urine: NEGATIVE
Glucose, UA: NEGATIVE mg/dL
Hgb urine dipstick: NEGATIVE
Ketones, ur: NEGATIVE mg/dL
Leukocytes, UA: NEGATIVE
Nitrite: NEGATIVE
Protein, ur: NEGATIVE mg/dL
Specific Gravity, Urine: 1.008 (ref 1.005–1.030)
Urobilinogen, UA: 1 mg/dL (ref 0.0–1.0)
pH: 7 (ref 5.0–8.0)

## 2014-11-03 LAB — PROTIME-INR
INR: 1.45 (ref 0.00–1.49)
Prothrombin Time: 17.8 seconds — ABNORMAL HIGH (ref 11.6–15.2)

## 2014-11-03 LAB — I-STAT TROPONIN, ED
TROPONIN I, POC: 0.02 ng/mL (ref 0.00–0.08)
TROPONIN I, POC: 0.35 ng/mL — AB (ref 0.00–0.08)

## 2014-11-03 LAB — D-DIMER, QUANTITATIVE: D-Dimer, Quant: 1.32 ug/mL-FEU — ABNORMAL HIGH (ref 0.00–0.48)

## 2014-11-03 LAB — PRO B NATRIURETIC PEPTIDE: Pro B Natriuretic peptide (BNP): 564 pg/mL — ABNORMAL HIGH (ref 0–125)

## 2014-11-03 LAB — TROPONIN I
TROPONIN I: 2.55 ng/mL — AB (ref <0.30)
TROPONIN I: 5.55 ng/mL — AB (ref <0.30)

## 2014-11-03 MED ORDER — METOPROLOL SUCCINATE 12.5 MG HALF TABLET
12.5000 mg | ORAL_TABLET | Freq: Every day | ORAL | Status: DC
  Administered 2014-11-03 – 2014-11-05 (×3): 12.5 mg via ORAL
  Filled 2014-11-03 (×3): qty 1

## 2014-11-03 MED ORDER — SODIUM CHLORIDE 0.9 % IJ SOLN: 3.0000 mL | Freq: Two times a day (BID) | INTRAMUSCULAR | Status: DC

## 2014-11-03 MED ORDER — FUROSEMIDE 10 MG/ML IJ SOLN
20.0000 mg | Freq: Once | INTRAMUSCULAR | Status: AC
  Administered 2014-11-03: 20 mg via INTRAVENOUS
  Filled 2014-11-03: qty 2

## 2014-11-03 MED ORDER — PREDNISONE 50 MG PO TABS
80.0000 mg | ORAL_TABLET | Freq: Every day | ORAL | Status: DC
  Administered 2014-11-04 – 2014-11-05 (×2): 80 mg via ORAL
  Filled 2014-11-03: qty 4
  Filled 2014-11-03 (×2): qty 1

## 2014-11-03 MED ORDER — DILTIAZEM HCL 100 MG IV SOLR
5.0000 mg/h | INTRAVENOUS | Status: DC
  Administered 2014-11-03 (×2): 5 mg/h via INTRAVENOUS

## 2014-11-03 MED ORDER — SODIUM CHLORIDE 0.9 % IV SOLN: 250.0000 mL | INTRAVENOUS | Status: DC | PRN

## 2014-11-03 MED ORDER — DILTIAZEM LOAD VIA INFUSION
10.0000 mg | Freq: Once | INTRAVENOUS | Status: AC
  Administered 2014-11-03: 10 mg via INTRAVENOUS
  Filled 2014-11-03: qty 10

## 2014-11-03 MED ORDER — ATORVASTATIN CALCIUM 40 MG PO TABS
40.0000 mg | ORAL_TABLET | Freq: Every evening | ORAL | Status: DC
  Administered 2014-11-03 – 2014-11-04 (×2): 40 mg via ORAL
  Filled 2014-11-03 (×3): qty 1

## 2014-11-03 MED ORDER — PANTOPRAZOLE SODIUM 40 MG PO TBEC
40.0000 mg | DELAYED_RELEASE_TABLET | Freq: Every day | ORAL | Status: DC
  Administered 2014-11-03 – 2014-11-05 (×3): 40 mg via ORAL
  Filled 2014-11-03 (×3): qty 1

## 2014-11-03 MED ORDER — SODIUM CHLORIDE 0.9 % IJ SOLN: 3.0000 mL | INTRAMUSCULAR | Status: DC | PRN

## 2014-11-03 MED ORDER — SODIUM CHLORIDE 0.9 % IV SOLN: INTRAVENOUS | Status: AC

## 2014-11-03 MED ORDER — HEPARIN BOLUS VIA INFUSION
4000.0000 [IU] | Freq: Once | INTRAVENOUS | Status: AC
Start: 2014-11-03 — End: 2014-11-03
  Administered 2014-11-03: 4000 [IU] via INTRAVENOUS
  Filled 2014-11-03: qty 4000

## 2014-11-03 MED ORDER — ASPIRIN 81 MG PO CHEW
81.0000 mg | CHEWABLE_TABLET | ORAL | Status: AC
  Administered 2014-11-04: 81 mg via ORAL
  Filled 2014-11-03: qty 1

## 2014-11-03 MED ORDER — ASPIRIN EC 81 MG PO TBEC: 81.0000 mg | DELAYED_RELEASE_TABLET | Freq: Every day | ORAL | Status: DC

## 2014-11-03 MED ORDER — ASPIRIN EC 81 MG PO TBEC
81.0000 mg | DELAYED_RELEASE_TABLET | Freq: Every day | ORAL | Status: DC
Start: 2014-11-05 — End: 2014-11-05
  Administered 2014-11-05: 81 mg via ORAL
  Filled 2014-11-03: qty 1

## 2014-11-03 MED ORDER — NITROGLYCERIN IN D5W 200-5 MCG/ML-% IV SOLN
0.0000 ug/min | Freq: Once | INTRAVENOUS | Status: AC
  Administered 2014-11-03: 5 ug/min via INTRAVENOUS
  Filled 2014-11-03: qty 250

## 2014-11-03 MED ORDER — ACETAMINOPHEN 325 MG PO TABS: 650.0000 mg | ORAL_TABLET | ORAL | Status: DC | PRN

## 2014-11-03 MED ORDER — ONDANSETRON HCL 4 MG/2ML IJ SOLN: 4.0000 mg | Freq: Four times a day (QID) | INTRAMUSCULAR | Status: DC | PRN

## 2014-11-03 MED ORDER — SODIUM CHLORIDE 0.9 % IV SOLN
INTRAVENOUS | Status: DC
  Administered 2014-11-04: 05:00:00 via INTRAVENOUS

## 2014-11-03 MED ORDER — HEPARIN (PORCINE) IN NACL 100-0.45 UNIT/ML-% IJ SOLN
1900.0000 [IU]/h | INTRAMUSCULAR | Status: DC
  Administered 2014-11-03: 1700 [IU]/h via INTRAVENOUS
  Filled 2014-11-03 (×3): qty 250

## 2014-11-03 MED ORDER — NITROGLYCERIN IN D5W 200-5 MCG/ML-% IV SOLN: 0.0000 ug/min | Freq: Once | INTRAVENOUS | Status: DC

## 2014-11-03 NOTE — ED Notes (Signed)
Pt arrived by [facility] with c/o cp and new onset afib. Pt had an MI on Tuesday 12/8 and discharged Saturday. Pt has not been started on Plavix at this time and was not able to have surgery d/t platelet count. Pt self administered ASA 324mg  and Nitro x1.

## 2014-11-03 NOTE — ED Provider Notes (Signed)
CSN: [id number]     Arrival date & time 11/03/14  1010 History   First MD Initiated Contact with Patient 11/03/14 1010     Chief Complaint  Patient presents with  . Chest Pain  . Atrial Fibrillation     (Consider location/radiation/quality/duration/timing/severity/associated sxs/prior Treatment) HPI Vincent Ochoa is a 58 y.o. male with history of coronary disease, prior MI, presents to emergency department complaining of chest pain. Patient states chest pain started around 9 AM this morning. He states he took 4 baby aspirins, one nitroglycerin, with some improvement. Patient states pain continued so he called EMS. He reports pain is in the left side of his chest, feels like tightness, radiates to the left jaw. Denies associated shortness of breath, dizziness, diaphoresis. Denies palpitations. Recently admitted for similar pain, at that time had abnormal stress test, there was a plan of cardiac cath, however patient's platelets were low and the procedure was not able to be done. He was discharged home with close follow-up, but he was unable to follow-up yet. Patient denies drugs or alcohol. He states he stopped smoking since being in the hospital. Denies back pain or abdominal pain  Past Medical History  Diagnosis Date  . GERD (gastroesophageal reflux disease)   . Psoriasis   . Frequent headaches   . Facial cellulitis 2014    Periorbital  . Coronary artery disease     a. Evaluated by Dr. Stanford Breed 2012 - nonobstructive 2007-2008 by cath. b. Abnormal stress test 10/2014 but cath deferred temporarily due to thrombocytopenia.  . Back pain, lumbosacral 2014  . Thrombocytopenia     a. Onset unclear, but noted 2012. ? 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  . Hyperlipidemia   . Obesity   . Elevated LFTs   . Fatty liver US - 2012   Past Surgical History  Procedure Laterality Date  . Tonsillectomy  1960's  . Cholecystectomy  2007  . Knee arthroplasty   1970's  . Facial cosmetic surgery  2014  . Cardiac catheterization     Family History  Problem Relation Age of Onset  . Arthritis Maternal Grandmother   . Heart disease Sister   . Diabetes Sister   . Cancer - Ovarian Sister   . Heart disease      Maternal/paternal grandparents  . Cirrhosis Mother    History  Substance Use Topics  . Smoking status: Current Every Day Smoker -- 1.50 packs/day for 43 years    Types: Cigarettes    Last Attempt to Quit: 08/01/2011  . Smokeless tobacco: Current User     Comment: 11/28/2012 "chew occasionally"  . Alcohol Use: Yes     Comment: 11/28/2012 "beer q 6 months or so"    Review of Systems  Constitutional: Negative for fever, chills and diaphoresis.  Respiratory: Positive for chest tightness. Negative for cough and shortness of breath.   Cardiovascular: Positive for chest pain and leg swelling. Negative for palpitations.  Gastrointestinal: Negative for nausea, vomiting, abdominal pain, diarrhea and abdominal distention.  Musculoskeletal: Negative for myalgias, arthralgias, neck pain and neck stiffness.  Skin: Negative for rash.  Allergic/Immunologic: Negative for immunocompromised state.  Neurological: Negative for dizziness, weakness, light-headedness, numbness and headaches.  All other systems reviewed and are negative.     Allergies  Review of patient's allergies indicates no known allergies.  Home Medications   Prior to Admission medications   Medication Sig Start Date End Date Taking? Authorizing Provider  aspirin 81 MG tablet  Take 1 tablet (81 mg total) by mouth daily. 10/31/14   Dayna N Dunn, PA-C  atorvastatin (LIPITOR) 40 MG tablet Take 1 tablet (40 mg total) by mouth every evening. 10/31/14   Dayna N Dunn, PA-C  metoprolol succinate (TOPROL-XL) 25 MG 24 hr tablet Take 0.5 tablets (12.5 mg total) by mouth daily. 10/31/14   Dayna N Dunn, PA-C  nitroGLYCERIN (NITROSTAT) 0.4 MG SL tablet Place 1 tablet (0.4 mg total) under the  tongue every 5 (five) minutes as needed for chest pain (up to 3 doses). 10/31/14   Dayna N Dunn, PA-C  pantoprazole (PROTONIX) 40 MG tablet Take 1 tablet (40 mg total) by mouth daily. 10/31/14   Dayna N Dunn, PA-C  predniSONE (DELTASONE) 20 MG tablet Take 4 tablets (80 mg total) by mouth daily with breakfast. 10/31/14   Dayna N Dunn, PA-C   BP 143/83 mmHg  Pulse 142  Resp 20  SpO2 92% Physical Exam  Constitutional: He is oriented to person, place, and time. He appears well-developed and well-nourished. No distress.  HENT:  Head: Normocephalic and atraumatic.  Eyes: Conjunctivae are normal.  Neck: Neck supple.  Cardiovascular: Normal heart sounds.  An irregularly irregular rhythm present. Tachycardia present.   Pulmonary/Chest: Effort normal. No respiratory distress. He has no wheezes. He has no rales.  Abdominal: Soft. Bowel sounds are normal. He exhibits no distension. There is no tenderness. There is no rebound.  Musculoskeletal: He exhibits edema.  2+ pitting edema bilaterally  Neurological: He is alert and oriented to person, place, and time.  Skin: Skin is warm and dry.  Nursing note and vitals reviewed.   ED Course  Procedures (including critical care time) Labs Review Labs Reviewed  CBC WITH DIFFERENTIAL  COMPREHENSIVE METABOLIC PANEL  [place], ED    Imaging Review No results found.   EKG Interpretation   Date/Time:  Monday November 03 2014 10:10:57 EST Ventricular Rate:  149 PR Interval:    QRS Duration: 89 QT Interval:  304 QTC Calculation: 479 R Axis:   78 Text Interpretation:  Atrial fibrillation Repol abnrm suggests ischemia,  diffuse leads new atrial fibrillation ST depression likely rate related  Confirmed by Wyvonnia Dusky  MD, STEPHEN [phone]) on 11/03/2014 10:19:04 AM      CRITICAL CARE Performed by: Jeannett Senior A Total critical care time: 30 Critical care time was exclusive of separately  billable procedures and treating other patients. Critical care was necessary to treat or prevent imminent or life-threatening deterioration. Critical care was time spent personally by me on the following activities: development of treatment plan with patient and/or surrogate as well as nursing, discussions with consultants, evaluation of patient's response to treatment, examination of patient, obtaining history from patient or surrogate, ordering and performing treatments and interventions, ordering and review of laboratory studies, ordering and review of radiographic studies, pulse oximetry and re-evaluation of patient's condition.   MDM   Final diagnoses:  Chest pain  Atrial fibrillation with rapid ventricular response   10:15 AM Seen and examined patient, patient with recent admission and abnormal stress test. History of prior mi. They did not do cardiac cath due to low platelets at that time. Does discharge 3 days ago. Similar pain. EKG showing A. fib with rapid ventricular response, hr 150s, new onset.  Blood pressure is normal. Will start Cardizem, will give additional nitroglycerin IV, lab work and chest x-ray pending. Will call cardiology  10:28 AM Spoke with Cardiology, will come by  and see pt.   11:00 AM Pt's BP 100/60. Pain 1/10. Nitro stopped. Will continue cardizem. Increased dose. Lasix ordered. Continues to have afib with RVR, HR still in 140s.   12:26 PM Pt converted to sinus rhythm. ST depressions noted, concerning for ischemia. Trop x1 normal. Will stop cardizem, restart nitro, pt still having mild pian. Heparin not started due to thrombocytopenia.   VS remain normal. Pt seen by cardiology who will admit pt.     Renold Genta, PA-C 11/03/14 [place], MD 11/03/14 1911

## 2014-11-03 NOTE — Progress Notes (Signed)
ANTICOAGULATION CONSULT NOTE - Initial Consult  Pharmacy Consult for Heparin  Indication: chest pain/ACS  No Known Allergies  Patient Measurements: Height: 6\' 4"  (193 cm) Weight: (!) 304 lb 0.2 oz (137.9 kg) IBW/kg (Calculated) : 86.8 Heparin Dosing Weight: 122 kg   Vital Signs: Temp: 98 F (36.7 C) (12/14 1600) Temp Source: Oral (12/14 1600) BP: 110/51 mmHg (12/14 1519) Pulse Rate: 66 (12/14 1600)  Labs:  Recent Labs  11/03/14 1014 11/03/14 1519  HGB 12.2*  --   HCT 35.7*  --   PLT 57*  --   LABPROT 17.8*  --   INR 1.45  --   CREATININE 0.77  --   TROPONINI  --  2.55*    Estimated Creatinine Clearance: 152.6 mL/min (by C-G formula based on Cr of 0.77).   Medical History: Past Medical History  Diagnosis Date  . GERD (gastroesophageal reflux disease)   . Psoriasis   . Frequent headaches   . Facial cellulitis 2014    Periorbital  . Coronary artery disease     a. Evaluated by Dr. Stanford Breed 2012 - nonobstructive 2007-2008 by cath. b. Abnormal stress test 10/2014 but cath deferred temporarily due to thrombocytopenia.  . Back pain, lumbosacral 2014  . Thrombocytopenia     a. Onset unclear, but noted 2012. ? 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  . Hyperlipidemia   . Obesity   . Elevated LFTs   . Fatty liver US - 2012  . AICD (automatic cardioverter/defibrillator) present     ATRIAL FIB WITH RVR  . Chronic bronchitis       Assessment: 58yom admitted for chest pain and Tp bump 2.55, with recent abnormal stress test.  Cath deferred until after w/u for thrombocytopenia - chronic PLTC at BL 50s, no bleeding.  Plan to begin heparin.    Goal of Therapy:  Heparin level 0.3-0.7 units/ml Monitor platelets by anticoagulation protocol: Yes   Plan:  Heparin bolus 4000 uts IV x1 Heparin drip 1700 uts/hr Check HL 6hr after start Daily HL, CBC  Bonnita Nasuti Pharm.D. CPP, BCPS Clinical Pharmacist [phone] 11/03/2014 6:31  PM

## 2014-11-03 NOTE — Consult Note (Signed)
[name]  Telephone:([phone]) [place] NOTE  CJ EDGELL                                MR#: [id number]  DOB: 12-Dec-1955                              CSN#: [id number]  Patient Care Team: Burnice Logan, MD as PCP - General (Internal Medicine)  I have seen the patient, examined him and edited the notes as follows  Referring MD: [facility]   Consulting MD: Dr. Alvy Bimler  Reason for Consult: Thrombocytopenia   Vincent Ochoa 58 y.o. male with a history of cardiac disease with plans of catheterization in rhe future, now admitted via Emergency Department with new onset of Atrial Fibrillation.  He was initially seen in consultation on 12/8 for thrombocytopenia, felt to be likely autoimmune (related to psoriasis and liver disease), responding well to 2 doses of IVIG in addition to prednisone and transfusions. Patient had been discharged on 12/11 home with baby aspirin, and was to follow on 12/15 at the [facility]. His platelet count on discharge was 51,000. Please refer to the consultation note on 10/28/14 by Dr. Alvy Bimler for further details. On presentation, he reported left sided chest pain and palpitations, shortness of breath and generalized fatigue. He denied any bleeding issues such as epistaxis other than when forcefully blowing his nose, hematemesis, hematuria or hematochezia. He denied any abdominal pain. He denied any headaches, vision changes or focal weaknesses. He denied any fevers, chills or night sweats. No confusion was reported.  Prior to admission, the patient had taken Aspirin 325 mg and nitroglycerin. His platelets on presentation are 57,000. He has mild anemia with a Hb of 12.2, normal MCV. His WBC is normal. Chest x ray shows possible pneumonia. We have been kindly notified of the patient's admission as cardiac catheterization is expected to be performed while at the hospital.  PMH:  Past Medical History  Diagnosis  Date  . GERD (gastroesophageal reflux disease)   . Psoriasis   . Frequent headaches   . Facial cellulitis 2014    Periorbital  . Coronary artery disease     a. Evaluated by Dr. Stanford Breed 2012 - nonobstructive 2007-2008 by cath. b. Abnormal stress test 10/2014 but cath deferred temporarily due to thrombocytopenia.  . Back pain, lumbosacral 2014  . Thrombocytopenia     a. Onset unclear, but noted 2012. ? 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  . Hyperlipidemia   . Obesity   . Elevated LFTs   . Fatty liver US - 2012  Remote exposure to agent orange in the 1960s during [place] War  Surgeries:  Past Surgical History  Procedure Laterality Date  . Tonsillectomy  1960's  . Cholecystectomy  2007  . Knee arthroplasty  1970's  . Facial cosmetic surgery  2014  . Cardiac catheterization      Allergies: No Known Allergies  Medications:  Scheduled Meds:  Continuous Infusions: . diltiazem (CARDIZEM) infusion Stopped (11/03/14 1224)   PRN Meds:.     Review of Systems: Constitutional: He complains of occasional night sweats, but no fevers or chills. He complains of generalized fatigue Eyes: Denies blurriness of vision, double vision or watery eyes Ears, nose, mouth, throat, and face: Denies mucositis or sore throat  Respiratory: He complains of productive cough, without hemoptysis exertional dyspnea, denies wheezes Cardiovascular: He complains of  palpitation, chest pain. The patient has chronic lower extremity swelling, left worse than right but denies calf pain. Gastrointestinal:  Denies nausea, heartburn or change in bowel habits.No blood in the stools. He complains of left upper quadrant pain, not new since last admission.  Skin: He has been having a flare of psoriasis for the last 5 weeks. He complains of easier bruising than prior hospitalization. Lymphatics: Denies new lymphadenopathy or easy bruising Neurological: Denies numbness, tingling or new  weaknesses Behavioral/Psych: Mood is stable, no new changes  All other systems were reviewed with the patient and are negative.    Family History:    Family History  Problem Relation Age of Onset  . Arthritis Maternal Grandmother   . Heart disease Sister   . Diabetes Sister   . Cancer - Ovarian Sister   . Heart disease      Maternal/paternal grandparents  . Cirrhosis Mother   He has one sister with leukemia  Social History: The patient is married, no children. He works in the construction industry. He is a [place] veteran, exposed to Agent Orange. He smokes about 1 pack a day of cigarettes for at least 40 years. He drinks minimal amount of alcohol. He denies any recreational drug intake.  Physical Exam    ECOG PERFORMANCE STATUS:1  Filed Vitals:   11/03/14 1424  BP: 108/65  Pulse: 67  Temp:   Resp: 18   There were no vitals filed for this visit.  GENERAL:alert, no distress and comfortable. He is morbidly obese SKIN: skin color, texture, turgor are normal. No new open lesions, but visible areas of psoriasis are seen on the right dorsal aspect of his hand and on his elbows. He has also some areas of bruising in his right elbow and toes.He has several tattoos EYES: normal, conjunctiva are pink and non-injected, sclera clear OROPHARYNX: no exudate, no erythema and lips, buccal mucosa, and tongue normal NECK: supple, thyroid normal size, non-tender, without nodularity LYMPH:  no palpable lymphadenopathy in the cervical, axillary or inguinal area LUNGS: clear to auscultation and percussion with normal breathing effort HEART: regular rate & rhythm and no murmurs and 1+ bilateral extremity edema, Left greater than right ABDOMEN: Obese, protuberant, Tender on the left upper quadrant, cannot appreciate hepatosplenomegaly due to body habitus, normal bowel sounds Musculoskeletal:no cyanosis of digits and no clubbing. Nails brittle.  PSYCH: alert & oriented x 3 with fluent  speech NEURO: no focal motor/sensory deficits He has bilateral gynecomastia  Labs:    Recent Labs Lab 10/30/14 0622 10/30/14 1200 10/30/14 1521 10/31/14 0348 10/31/14 1439 11/03/14 1014  WBC 4.6 6.0 5.7 4.8 4.4 5.3  HGB 12.9* 13.5 12.7* 11.4* 11.7* 12.2*  HCT 38.8* 39.9 37.8* 34.4* 34.9* 35.7*  PLT 48* 51* 47* 47* 51* 57*  MCV 93.5 93.7 93.8 94.0 94.3 92.0  MCH 31.1 31.7 31.5 31.1 31.6 31.4  MCHC 33.2 33.8 33.6 33.1 33.5 34.2  RDW 14.0 14.1 14.1 14.5 14.7 14.6  LYMPHSABS 0.8 0.8 0.9 1.3  --  1.3  MONOABS 0.5 0.2 0.4 0.5  --  0.5  EOSABS 0.0 0.0 0.0 0.1  --  0.0  BASOSABS 0.0 0.0 0.0 0.0  --  0.0        Recent Labs Lab 10/28/14 1645 10/28/14 2255 10/29/14 0455 11/03/14 1014  NA 143 139 142 139  K 4.0 3.9 4.0 4.3  CL 105 103 106   104  CO2 25 24 25 23  GLUCOSE 96 122* 104* 132*  BUN 9 9 10 20  CREATININE 0.70 0.75 0.80 0.77  CALCIUM 9.1 9.1 8.6 8.8  MG  --  1.7  --   --   AST 73* 69*  --  64*  ALT 66* 65*  --  64*  ALKPHOS 81 77  --  72  BILITOT 1.3* 1.7*  --  1.7*        Component Value Date/Time   BILITOT 1.7* 11/03/2014 1014   BILIDIR 0.1 07/29/2011 1628   IBILI 0.5 07/29/2011 1628      Recent Labs Lab 10/28/14 2255 10/29/14 0455 11/03/14 1014  INR 1.29 1.28 1.45  Imaging Studies:  Dg Chest 2 View  10/28/2014   CLINICAL DATA:  Left upper chest pain for 2 weeks  EXAM: CHEST  2 VIEW  COMPARISON:  03/17/2010  FINDINGS: Cardiac shadow is within normal limits. Lungs are well aerated bilaterally. No focal infiltrate or effusion is noted. No acute bony abnormality is seen.  IMPRESSION: No acute abnormality noted.   Electronically Signed   By: Mark  Lukens M.D.   On: 10/28/2014 17:25   Nm Myocar Multi W/spect W/wall Motion / Ef  10/30/2014   ADDENDUM REPORT: 10/30/2014 14:38  ADDENDUM: Given the fixed defect and inducible portions in the setting of left ventricular dilation, this study represents a high risk stress test finding.   Electronically  Signed   By: Chris  Mattern M.D.   On: 10/30/2014 14:38   10/30/2014   CLINICAL DATA:  Chest pain.  Coronary artery disease.  EXAM: MYOCARDIAL IMAGING WITH SPECT (REST AND PHARMACOLOGIC-STRESS - 2 DAY PROTOCOL)  GATED LEFT VENTRICULAR WALL MOTION STUDY  LEFT VENTRICULAR EJECTION FRACTION  TECHNIQUE: Standard myocardial SPECT imaging was performed after resting intravenous injection of 30 mCi Tc-99m sestamibi. Subsequently, on a second day, intravenous infusion of Lexiscan was performed under the supervision of the Cardiology staff. At peak effect of the drug, 30 mCi Tc-99m sestamibi was injected intravenously and standard myocardial SPECT imaging was performed. Quantitative gated imaging was also performed to evaluate left ventricular wall motion, and estimate left ventricular ejection fraction.  COMPARISON:  Two-view chest x-ray 10/28/2014. Nuclear medicine cardiac perfusion exam 07/18/2009.  FINDINGS: Perfusion: There is decreased uptake on both rest and stress images within the inferolateral aspect of the left ventricle. This is slightly worse on the stress images. Mild apical thinning is present without reversibility.  Wall Motion: Wall motion is somewhat hypokinetic but symmetric.  Left Ventricular Ejection Fraction: 50 %  End diastolic volume 221 ml  End systolic volume 111 ml  IMPRESSION: 1. Mild reversibility surrounding a more chronic infarct in the inferolateral wall, suspicious for ischemia adjacent to a more chronic infarct.  2. Mild hypokinesis.  3. Left ventricular ejection fraction 50%  4. Intermediate-risk stress test findings*.  *2012 Appropriate Use Criteria for Coronary Revascularization Focused Update: J Am Coll Cardiol. 2012;59(9):857-881. http://content.onlinejacc.org/article.aspx?articleid=1201161  Electronically Signed: By: Chris  Mattern M.D. On: 10/30/2014 14:29   Dg Chest Portable 1 View  11/03/2014   CLINICAL DATA:  Lower chest pain and new onset atrial fibrillation, post MI on  10/28/2014 discharge 2 days ago, history smoking  EXAM: PORTABLE CHEST - 1 VIEW  COMPARISON:  Portable exam 1018 hr compared to 10/28/2014  FINDINGS: Borderline enlargement of cardiac silhouette with pulmonary vascular congestion.  Bibasilar infiltrates greater on RIGHT, question edema versus infection.  Upper lungs clear.  No pleural effusion or pneumothorax.    Central peribronchial thickening  IMPRESSION: Borderline enlargement of cardiac silhouette with pulmonary vascular congestion.  Bibasilar infiltrates RIGHT greater than LEFT question pneumonia versus edema, new.   Electronically Signed   By: Lavonia Dana M.D.   On: 11/03/2014 10:29     A/P: 58 y.o. 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 There is a component of thrombocytopenia related to liver disease. He had history of hepatitis that recent repeat hepatitis panel was negative. HIV is negative He received prednisone on 12/9. Due to suboptimal response, IVIG was given on 12/10 and 12/11. Latest B12 is 612. ANA is negative. Sed Rate is normal at 8 and Rheumatoid Factor is negative His platelet count on admission is 57,000 without bleeding issues. Heparin was not given; he is to continue with Aspirin Cardiac catheterization planned on 12/15.  There is no indication to proceed with bone marrow biopsy. There is no contraindication to proceed with cardiac catheterization and cardiac intervention deemed necessary. If his platelet count drop again tomorrow, I will start him on Nplate to stimulate his bone marrow to increase platelet production.  Chest pain in the setting of chronic coronary artery disease Atrial Fibrillation, spontaneously converted to sinus rhythm Patient is to undergo cardiac catheterization on 12/15, with possible stent placement He is currently on  maximal medical management. I have discussed with the interventional cardiologist. He will remain on antiplatelet agents if necessary.  Anemia Likely of chronic disease and dilution. No bleeding issues reported No transfusion is indicated at this time  Recent cough This has resolved since IVIG treatment.  Full Code Other medical problems as per admitting team.    Rondel Jumbo, PA-C 11/03/2014 2:41 PM [place], Estefanie Cornforth, MD 11/03/2014

## 2014-11-03 NOTE — ED Notes (Signed)
Pt converted into NSR with PACs

## 2014-11-03 NOTE — H&P (Signed)
Patient ID: Vincent Ochoa MRN: [id number] DOB/AGE: 1956-03-01 58 y.o. Admit date: 11/03/2014  Primary Cardiologist: Stanford Breed  HPI: 58 yo male with history of CAD who is admitted with chest pain and atrial fibrillation with RVR. He was recently admitted 10/28/14 with chest pain and underwent stress myoview which was a high risk study with LV dilatation, inferolateral infarction with peri-infarct ischemia. Cardiac cath was discussed but postponed due to low platelet count. Hematology was consulted. Dr. 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 She felt there was a component of thrombocytopenia related to liver disease and that his mild leukopenia could be associated with recent infection causing bone marrow suppression or autoimmune in nature. He has recurrent nosebleeds, but suspected due to forcefully sneezing rather than low platelets. She started him on prednisone. He had no significant change in platelet count the following day. Thus she initiated IVIG therapy.  His platelet count was still less than 50,000 on 12/11/5 and he was discharged home without proceeding with cardiac cath. 2D echo showed EF 60-65%, grade 2/2, mod dilated LA, normal RV function, mildly dilated RA, mild TR. He also had a negative ANA, RF, hepatitis panel, TSH, sed rate. Troponins remained negative. He was only able to tolerate low dose Toprol due to HR in the 50's. He was discharged with Prednisone therapy and plans for f/u in Hematology clinic 11/04/14 with recommendations at that time for appropriate time to proceed with cardiac cath.   He presented to the ED this am at [facility] with c/o left sided chest pressure with radiation to the left jaw. No associated dyspnea but he did note palpitations. EKG showed atrial fibrillation with RVR. He was started on IV Cardizem and has converted to sinus rhythm. Currently resting comfortably  without complaints of chest pain or SOB. No palpitations at this time. Mild LE edema at home.   Review of systems complete and found to be negative unless listed above   Past Medical History  Diagnosis Date  . GERD (gastroesophageal reflux disease)   . Psoriasis   . Frequent headaches   . Facial cellulitis 2014    Periorbital  . Coronary artery disease     a. Evaluated by Dr. Stanford Breed 2012 - nonobstructive 2007-2008 by cath. b. Abnormal stress test 10/2014 but cath deferred temporarily due to thrombocytopenia.  . Back pain, lumbosacral 2014  . Thrombocytopenia     a. Onset unclear, but noted 2012. ? 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  . Hyperlipidemia   . Obesity   . Elevated LFTs   . Fatty liver US - 2012    Family History  Problem Relation Age of Onset  . Arthritis Maternal Grandmother   . Heart disease Sister   . Diabetes Sister   . Cancer - Ovarian Sister   . Heart disease      Maternal/paternal grandparents  . Cirrhosis Mother     History   Social History  . Marital Status: Married    Spouse Name: N/A    Number of Children: N/A  . Years of Education: N/A   Occupational History  . Not on file.   Social History Main Topics  . Smoking status: Current Every Day Smoker -- 1.50 packs/day for 43 years    Types: Cigarettes    Last Attempt to Quit: 08/01/2011  . Smokeless tobacco: Current User     Comment: 11/28/2012 "  chew occasionally"  . Alcohol Use: Yes     Comment: 11/28/2012 "beer q 6 months or so"  . Drug Use: No  . Sexual Activity: No   Other Topics Concern  . Not on file   Social History Narrative    Past Surgical History  Procedure Laterality Date  . Tonsillectomy  1960's  . Cholecystectomy  2007  . Knee arthroplasty  1970's  . Facial cosmetic surgery  2014  . Cardiac catheterization      No Known Allergies  Prior to Admission Meds:  Prior to Admission medications   Medication Sig Start Date End Date Taking?  Authorizing Provider  aspirin 81 MG tablet Take 1 tablet (81 mg total) by mouth daily. 10/31/14  Yes Dayna N Dunn, PA-C  atorvastatin (LIPITOR) 40 MG tablet Take 1 tablet (40 mg total) by mouth every evening. 10/31/14  Yes Dayna N Dunn, PA-C  metoprolol succinate (TOPROL-XL) 25 MG 24 hr tablet Take 0.5 tablets (12.5 mg total) by mouth daily. Patient taking differently: Take 12.5 mg by mouth every evening.  10/31/14  Yes Dayna N Dunn, PA-C  Multiple Vitamin (MULTIVITAMIN WITH MINERALS) TABS tablet Take 1 tablet by mouth daily.   Yes Historical Provider, MD  nitroGLYCERIN (NITROSTAT) 0.4 MG SL tablet Place 1 tablet (0.4 mg total) under the tongue every 5 (five) minutes as needed for chest pain (up to 3 doses). 10/31/14  Yes Dayna N Dunn, PA-C  pantoprazole (PROTONIX) 40 MG tablet Take 1 tablet (40 mg total) by mouth daily. 10/31/14  Yes Dayna N Dunn, PA-C  predniSONE (DELTASONE) 20 MG tablet Take 4 tablets (80 mg total) by mouth daily with breakfast. 10/31/14  Yes Dayna N Dunn, PA-C   Physical Exam: Blood pressure 106/78, pulse 63, temperature 98.1 F (36.7 C), temperature source Oral, resp. rate 13, SpO2 96 %.    General: Well developed, well nourished, NAD  HEENT: OP clear, mucus membranes moist  SKIN: warm, dry. No rashes.  Neuro: No focal deficits  Musculoskeletal: Muscle strength 5/5 all ext  Psychiatric: Mood and affect normal  Neck: No JVD, no carotid bruits, no thyromegaly, no lymphadenopathy.  Lungs:Clear bilaterally, no wheezes, rhonci, crackles  Cardiovascular: Regular rate and rhythm. No murmurs, gallops or rubs.  Abdomen:Soft. Bowel sounds present. Non-tender.  Extremities: Trace bilateral lower extremity edema. Pulses are 2 + in the bilateral DP/PT.  Labs:   Lab Results  Component Value Date   WBC 5.3 11/03/2014   HGB 12.2* 11/03/2014   HCT 35.7* 11/03/2014   MCV 92.0 11/03/2014   PLT 57* 11/03/2014     Recent Labs Lab 11/03/14 1014  NA 139  K 4.3  CL 104  CO2  23  BUN 20  CREATININE 0.77  CALCIUM 8.8  PROT 9.1*  BILITOT 1.7*  ALKPHOS 72  ALT 64*  AST 64*  GLUCOSE 132*    Chest x-ray: 11/03/14: Borderline enlargement of cardiac silhouette with pulmonary vascular congestion.  Bibasilar infiltrates RIGHT greater than LEFT question pneumonia versus edema, new.  EKG: Atrial fib with rate of 149 bpm.   ASSESSMENT AND PLAN:   1. Chest pain/unstable angina: Pt with known CAD with recent high risk stress test last week but cardiac cath delayed due to thrombocytopenia. He has received IVIG and Prednisone and platelet count now is over 50,000. He clearly needs a cardiac cath to define his CAD. Will admit to telemetry unit today. Will hold on IV heparin with thrombocytopenia. Will continue ASA, statin, beta blocker. I have  spoken to Dr. Alvy Bimler with Hematology and she has agreed to see Mr. Trudel. After discussion, we will most likely plan on proceeding with cardiac cath tomorrow with possible PCI. I would suggest a bare metal stent if a stent is required. Would also use ASA and Plavix if stenting is necessary.  Will make NPO at midnight tonight and place on the cath schedule for tomorrow.   2. Atrial fibrillation with RVR: Spontaneously converted to sinus. Will not start heparin given thrombocytopenia. Continue beta blocker. Follow on telemetry.   3. Hyperlipidemia: Continue statin  4. Thrombocytopenia: Will ask Hematology to see today.   Darlina Guys, MD 11/03/2014, 12:48 PM

## 2014-11-04 ENCOUNTER — Encounter (HOSPITAL_COMMUNITY)
Admission: EM | Disposition: A | Discharge status: Home / Self Care | Payer: MEDICAID | Source: Home / Self Care | Attending: Cardiology

## 2014-11-04 ENCOUNTER — Encounter (HOSPITAL_COMMUNITY): Payer: Self-pay | Admitting: Interventional Cardiology

## 2014-11-04 DIAGNOSIS — I214 Non-ST elevation (NSTEMI) myocardial infarction: Secondary | ICD-10-CM | POA: Diagnosis present

## 2014-11-04 HISTORY — PX: LEFT HEART CATHETERIZATION WITH CORONARY ANGIOGRAM: SHX5451

## 2014-11-04 LAB — CBC
HEMATOCRIT: 33.2 % — AB (ref 39.0–52.0)
HEMOGLOBIN: 11.2 g/dL — AB (ref 13.0–17.0)
MCH: 31.1 pg (ref 26.0–34.0)
MCHC: 33.7 g/dL (ref 30.0–36.0)
MCV: 92.2 fL (ref 78.0–100.0)
Platelets: 57 10*3/uL — ABNORMAL LOW (ref 150–400)
RBC: 3.6 MIL/uL — AB (ref 4.22–5.81)
RDW: 14.6 % (ref 11.5–15.5)
WBC: 7.4 10*3/uL (ref 4.0–10.5)

## 2014-11-04 LAB — POCT ACTIVATED CLOTTING TIME: Activated Clotting Time: 552 seconds

## 2014-11-04 LAB — PROTIME-INR
INR: 1.55 — ABNORMAL HIGH (ref 0.00–1.49)
Prothrombin Time: 18.7 seconds — ABNORMAL HIGH (ref 11.6–15.2)

## 2014-11-04 LAB — BASIC METABOLIC PANEL
ANION GAP: 11 (ref 5–15)
BUN: 21 mg/dL (ref 6–23)
CHLORIDE: 101 meq/L (ref 96–112)
CO2: 24 mEq/L (ref 19–32)
CREATININE: 0.79 mg/dL (ref 0.50–1.35)
Calcium: 8.6 mg/dL (ref 8.4–10.5)
Glucose, Bld: 125 mg/dL — ABNORMAL HIGH (ref 70–99)
Potassium: 4.2 mEq/L (ref 3.7–5.3)
Sodium: 136 mEq/L — ABNORMAL LOW (ref 137–147)

## 2014-11-04 LAB — HEPARIN LEVEL (UNFRACTIONATED)
HEPARIN UNFRACTIONATED: 0.4 [IU]/mL (ref 0.30–0.70)
Heparin Unfractionated: 0.28 IU/mL — ABNORMAL LOW (ref 0.30–0.70)

## 2014-11-04 LAB — TROPONIN I: TROPONIN I: 7.16 ng/mL — AB (ref <0.30)

## 2014-11-04 SURGERY — LEFT HEART CATHETERIZATION WITH CORONARY ANGIOGRAM
Anesthesia: LOCAL

## 2014-11-04 MED ORDER — ACTIVE PARTNERSHIP FOR HEALTH OF YOUR HEART BOOK
Freq: Once | Status: AC
  Administered 2014-11-04: 22:00:00
  Filled 2014-11-04: qty 1

## 2014-11-04 MED ORDER — CLOPIDOGREL BISULFATE 75 MG PO TABS
75.0000 mg | ORAL_TABLET | Freq: Every day | ORAL | Status: DC
  Administered 2014-11-05: 08:00:00 75 mg via ORAL
  Filled 2014-11-04: qty 1

## 2014-11-04 MED ORDER — BIVALIRUDIN 250 MG IV SOLR
INTRAVENOUS | Status: AC
  Filled 2014-11-04: qty 250

## 2014-11-04 MED ORDER — CLOPIDOGREL BISULFATE 300 MG PO TABS
ORAL_TABLET | ORAL | Status: AC
  Filled 2014-11-04: qty 1

## 2014-11-04 MED ORDER — SODIUM CHLORIDE 0.9 % IV SOLN: INTRAVENOUS | Status: DC

## 2014-11-04 MED ORDER — MIDAZOLAM HCL 2 MG/2ML IJ SOLN
INTRAMUSCULAR | Status: AC
  Filled 2014-11-04: qty 2

## 2014-11-04 MED ORDER — NITROGLYCERIN 1 MG/10 ML FOR IR/CATH LAB
INTRA_ARTERIAL | Status: AC
  Filled 2014-11-04: qty 10

## 2014-11-04 MED ORDER — VERAPAMIL HCL 2.5 MG/ML IV SOLN
INTRAVENOUS | Status: AC
  Filled 2014-11-04: qty 2

## 2014-11-04 MED ORDER — HEART ATTACK BOUNCING BOOK
Freq: Once | Status: AC
  Administered 2014-11-04: 22:00:00
  Filled 2014-11-04: qty 1

## 2014-11-04 MED ORDER — ACETAMINOPHEN 325 MG PO TABS: 650.0000 mg | ORAL_TABLET | ORAL | Status: DC | PRN

## 2014-11-04 MED ORDER — ONDANSETRON HCL 4 MG/2ML IJ SOLN: 4.0000 mg | Freq: Four times a day (QID) | INTRAMUSCULAR | Status: DC | PRN

## 2014-11-04 MED ORDER — ASPIRIN 81 MG PO CHEW: 81.0000 mg | CHEWABLE_TABLET | Freq: Every day | ORAL | Status: DC

## 2014-11-04 MED ORDER — HEPARIN SODIUM (PORCINE) 1000 UNIT/ML IJ SOLN
INTRAMUSCULAR | Status: AC
  Filled 2014-11-04: qty 1

## 2014-11-04 MED ORDER — HEPARIN (PORCINE) IN NACL 2-0.9 UNIT/ML-% IJ SOLN
INTRAMUSCULAR | Status: AC
  Filled 2014-11-04: qty 1000

## 2014-11-04 MED ORDER — LIDOCAINE HCL (PF) 1 % IJ SOLN
INTRAMUSCULAR | Status: AC
  Filled 2014-11-04: qty 30

## 2014-11-04 MED ORDER — FENTANYL CITRATE 0.05 MG/ML IJ SOLN
INTRAMUSCULAR | Status: AC
  Filled 2014-11-04: qty 2

## 2014-11-04 NOTE — CV Procedure (Signed)
       PROCEDURE:  Left heart catheterization with selective coronary angiography, PCI mid circumflex.  INDICATIONS:  NSTEMI  The risks, benefits, and details of the procedure were explained to the patient.  The patient verbalized understanding and wanted to proceed.  Informed written consent was obtained.  PROCEDURE TECHNIQUE:  After Xylocaine anesthesia a 29F slender sheath was placed in the right radial artery with a single anterior needle wall stick.   IV Heparin was given.  Right coronary angiography was done using a Judkins R4 guide catheter.  Left coronary angiography was done using a Judkins L3.5 guide catheter.  Left ventriculography was done using a pigtail catheter.  A TR band was used for hemostasis.   CONTRAST:  Total of 230 cc.  COMPLICATIONS:  None.    HEMODYNAMICS:  Aortic pressure was 108/76; LV pressure was 108/22; LVEDP 32.  There was no gradient between the left ventricle and aorta.    ANGIOGRAPHIC DATA:   The left main coronary artery is a large vessel. There is mild, distal disease.  The left anterior descending artery is a large vessel which wraps around the apex. There is mild disease in the mid vessel. There is a large diagonal vessel which is patent.  The left circumflex artery is a large vessel proximally. In the mid vessel, there is a 99% stenosis. Prior to this stenosis, there is a first marginal branch which is widely patent. There is a small second marginal branch as well which is patent. The actual stenosis is at the origin of a third small obtuse marginal. The distal circumflex and final obtuse marginal are widely patent.  The right coronary artery is a large, dominant vessel. In the mid vessel, there is an area of moderate atherosclerosis, up to 50%. The posterior descending artery is large and widely patent. The posterior lateral artery is medium sized and patent.  LEFT VENTRICULOGRAM:  Left ventricular angiogram was not done due to the elevated LVEDP.   LVEDP was 32 mmHg.  PCI NARRATIVE: Angiomax was used for anticoagulation. Due to the patient's size, imaging in the caudal views was a challenge. A pro-water wire was initially advanced but would not cross the lesion. A fielder XT was then advanced successfully. The area was predilated with a 2.0 x 12 balloon. A 2.25 x 12 mm vision stent was deployed. There was still a small waist. A 2.75 x 8 balloon was used to post dilate with several inflations. There was an excellent angiographic result after intracoronary nitroglycerin. Overall, the patient tolerated the procedure well. He did have typical anginal symptoms with balloon inflations.  IMPRESSIONS:  1. Patent left main coronary artery. 2. Patent left anterior descending artery and its branches. 3. 99% stenosis in the mid left circumflex artery which was the culprit for his non-STEMI. This was successfully treated with a 2.25 x 12 vision bare-metal stent, postdilated to 2.8 mm in diameter. Bare-metal stent was used due to his thrombocytopenia. 4. Moderate disease in the mid right coronary artery. 5. Left ventricular systolic function not assessed.  LVEDP 32 mmHg.    RECOMMENDATION:  Continue dual antiplatelet therapy for at least a month. Continue with workup for thrombocytopenia. Atrial fibrillation management per Dr. Angelena Form.

## 2014-11-04 NOTE — Progress Notes (Signed)
Patient with multiple runs of SVT, strips saved in epic chart.  Patient sleeping when nurse entered room.  RN woke patient up, patient denies chest pain and/or shortness of breath.  Vital signs stable (see vitals at 0225).  On call for cardiology paged.  Dr. Elias Else returned page and was updated of all information in this note.  No new orders received at this time.  RN also reviewed recent troponin (7.16) that resulted about 0040, last troponin at 2129 was 5.55 with Dr. Elias Else.  No new orders.

## 2014-11-04 NOTE — Progress Notes (Signed)
ANTICOAGULATION CONSULT NOTE Pharmacy Consult for Heparin  Indication: chest pain/ACS  No Known Allergies  Patient Measurements: Height: 6\' 4"  (193 cm) Weight: (!) 304 lb 0.2 oz (137.9 kg) IBW/kg (Calculated) : 86.8 Heparin Dosing Weight: 122 kg   Vital Signs: Temp: 98.3 F (36.8 C) (12/14 2016) Temp Source: Oral (12/14 2016) BP: 113/57 mmHg (12/14 2016) Pulse Rate: 67 (12/14 2016)  Labs:  Recent Labs  11/03/14 1014 11/03/14 1519 11/03/14 2129 11/04/14 0040  HGB 12.2*  --   --  11.2*  HCT 35.7*  --   --  33.2*  PLT 57*  --   --  57*  LABPROT 17.8*  --   --  18.7*  INR 1.45  --   --  1.55*  HEPARINUNFRC  --   --   --  0.28*  CREATININE 0.77  --   --  0.79  TROPONINI  --  2.55* 5.55* 7.16*    Estimated Creatinine Clearance: 152.6 mL/min (by C-G formula based on Cr of 0.79).  Assessment: 58 y.o. male with chest pain for heparin   Goal of Therapy:  Heparin level 0.3-0.7 units/ml Monitor platelets by anticoagulation protocol: Yes   Plan:  Increase Heparin 1900 units/hr  Phillis Knack, PharmD, BCPS 11/04/2014 2:31 AM

## 2014-11-04 NOTE — Progress Notes (Signed)
TR BAND REMOVAL  LOCATION:    right radial  DEFLATED PER PROTOCOL:    Yes.    TIME BAND OFF / DRESSING APPLIED:    1600  SITE UPON ARRIVAL:    Level 0  SITE AFTER BAND REMOVAL:    Level 0   CIRCULATION SENSATION AND MOVEMENT:    Within Normal Limits   Yes.    COMMENTS:   Rechecked site at 1630 and 1700 with no change in assessment. Dressing remains dry and intact , CSMs wnls and radial, ulnar pulses +2.

## 2014-11-04 NOTE — Interval H&P Note (Signed)
Cath Lab Visit (complete for each Cath Lab visit)  Clinical Evaluation Leading to the Procedure:   ACS: Yes.    Non-ACS:    Anginal Classification: CCS IV  Anti-ischemic medical therapy: Minimal Therapy (1 class of medications)  Non-Invasive Test Results: No non-invasive testing performed  Prior CABG: No previous CABG      History and Physical Interval Note:  11/04/2014 8:52 AM  Vincent Ochoa  has presented today for surgery, with the diagnosis of cp  The various methods of treatment have been discussed with the patient and family. After consideration of risks, benefits and other options for treatment, the patient has consented to  Procedure(s): LEFT HEART CATHETERIZATION WITH CORONARY ANGIOGRAM (N/A) as a surgical intervention .  The patient's history has been reviewed, patient examined, no change in status, stable for surgery.  I have reviewed the patient's chart and labs.  Questions were answered to the patient's satisfaction.     Tomer Chalmers S.

## 2014-11-04 NOTE — Progress Notes (Signed)
Pt admitted with atrial fibrillation with RVR. New diagnosis. Now sinus. His CHADSVASC score is 0.   MCALHANY,CHRISTOPHER 11/04/2014 11:27 AM

## 2014-11-05 ENCOUNTER — Encounter (HOSPITAL_COMMUNITY): Payer: Self-pay | Admitting: Physician Assistant

## 2014-11-05 ENCOUNTER — Telehealth: Payer: Self-pay | Admitting: Physician Assistant

## 2014-11-05 DIAGNOSIS — R7989 Other specified abnormal findings of blood chemistry: Secondary | ICD-10-CM

## 2014-11-05 DIAGNOSIS — I214 Non-ST elevation (NSTEMI) myocardial infarction: Principal | ICD-10-CM

## 2014-11-05 DIAGNOSIS — E669 Obesity, unspecified: Secondary | ICD-10-CM

## 2014-11-05 DIAGNOSIS — R945 Abnormal results of liver function studies: Secondary | ICD-10-CM

## 2014-11-05 DIAGNOSIS — E785 Hyperlipidemia, unspecified: Secondary | ICD-10-CM

## 2014-11-05 DIAGNOSIS — I480 Paroxysmal atrial fibrillation: Secondary | ICD-10-CM

## 2014-11-05 LAB — CBC
HEMATOCRIT: 34.5 % — AB (ref 39.0–52.0)
HEMOGLOBIN: 11.5 g/dL — AB (ref 13.0–17.0)
MCH: 31.2 pg (ref 26.0–34.0)
MCHC: 33.3 g/dL (ref 30.0–36.0)
MCV: 93.5 fL (ref 78.0–100.0)
Platelets: 66 10*3/uL — ABNORMAL LOW (ref 150–400)
RBC: 3.69 MIL/uL — AB (ref 4.22–5.81)
RDW: 14.9 % (ref 11.5–15.5)
WBC: 6.8 10*3/uL (ref 4.0–10.5)

## 2014-11-05 LAB — BASIC METABOLIC PANEL
Anion gap: 7 (ref 5–15)
BUN: 17 mg/dL (ref 6–23)
CALCIUM: 8.9 mg/dL (ref 8.4–10.5)
CO2: 29 meq/L (ref 19–32)
CREATININE: 0.86 mg/dL (ref 0.50–1.35)
Chloride: 106 mEq/L (ref 96–112)
GFR calc Af Amer: >90 mL/min (ref >90)
GFR calc non Af Amer: >90 mL/min (ref >90)
GLUCOSE: 90 mg/dL (ref 70–99)
Potassium: 5 mEq/L (ref 3.7–5.3)
Sodium: 142 mEq/L (ref 137–147)

## 2014-11-05 MED ORDER — LORATADINE 10 MG PO TABS
10.0000 mg | ORAL_TABLET | Freq: Every day | ORAL | Status: DC | PRN
  Administered 2014-11-05: 10 mg via ORAL
  Filled 2014-11-05 (×2): qty 1

## 2014-11-05 MED ORDER — CLOPIDOGREL BISULFATE 75 MG PO TABS: 75.0000 mg | ORAL_TABLET | Freq: Every day | ORAL | Status: DC

## 2014-11-05 MED FILL — Sodium Chloride IV Soln 0.9%: INTRAVENOUS | Qty: 50 | Status: AC

## 2014-11-05 NOTE — Progress Notes (Signed)
Patient: Vincent Ochoa / Admit Date: 11/03/2014 / Date of Encounter: 11/05/2014, 6:44 AM   Subjective: Had an episode of vomiting this AM precipitated by sinus drainage. No chest pain or SOB. Feeling "great" otherwise.   Objective: Telemetry: NSR/SB (mostly nocturnal) Physical Exam: Blood pressure 137/70, pulse 66, temperature 97.9 F (36.6 C), temperature source Oral, resp. rate 15, height 6\' 4"  (1.93 m), weight 304 lb 14.3 oz (138.3 kg), SpO2 95 %. General: Well developed, well nourished obese WM in no acute distress. Head: Normocephalic, atraumatic, sclera non-icteric, no xanthomas, nares are without discharge. Neck: JVP not elevated. Lungs: Clear bilaterally to auscultation without wheezes, rales, or rhonchi. Breathing is unlabored. Heart: RRR S1 S2 without murmurs, rubs, or gallops.  Abdomen: Soft, non-tender, non-distended with normoactive bowel sounds. No rebound/guarding. Extremities: No clubbing or cyanosis. No edema. Distal pedal pulses are 2+ and equal bilaterally. R radial site clear without ecchymosis or hematoma, good pulse Neuro: Alert and oriented X 3. Moves all extremities spontaneously. Psych:  Responds to questions appropriately with a normal affect.   Intake/Output Summary (Last 24 hours) at 11/05/14 0644 Last data filed at 11/05/14 0423  Gross per 24 hour  Intake 909.95 ml  Output   1875 ml  Net -965.05 ml    Inpatient Medications:  . aspirin EC  81 mg Oral Daily  . atorvastatin  40 mg Oral QPM  . clopidogrel  75 mg Oral Q breakfast  . metoprolol succinate  12.5 mg Oral Daily  . pantoprazole  40 mg Oral Daily  . predniSONE  80 mg Oral Q breakfast   Infusions:    Labs:  Recent Labs  11/04/14 0040 11/05/14 0403  NA 136* 142  K 4.2 5.0  CL 101 106  CO2 24 29  GLUCOSE 125* 90  BUN 21 17  CREATININE 0.79 0.86  CALCIUM 8.6 8.9    Recent Labs  11/03/14 1014  AST 64*  ALT 64*  ALKPHOS 72  BILITOT 1.7*  PROT 9.1*  ALBUMIN 3.0*     Recent Labs  11/03/14 1014 11/04/14 0040 11/05/14 0403  WBC 5.3 7.4 6.8  NEUTROABS 3.5  --   --   HGB 12.2* 11.2* 11.5*  HCT 35.7* 33.2* 34.5*  MCV 92.0 92.2 93.5  PLT 57* 57* 66*    Recent Labs  11/03/14 1519 11/03/14 2129 11/04/14 0040  TROPONINI 2.55* 5.55* 7.16*   Invalid input(s): POCBNP No results for input(s): HGBA1C in the last 72 hours.   Radiology/Studies:  Dg Chest 2 View  10/28/2014   CLINICAL DATA:  Left upper chest pain for 2 weeks  EXAM: CHEST  2 VIEW  COMPARISON:  03/17/2010  FINDINGS: Cardiac shadow is within normal limits. Lungs are well aerated bilaterally. No focal infiltrate or effusion is noted. No acute bony abnormality is seen.  IMPRESSION: No acute abnormality noted.   Electronically Signed   By: Inez Catalina M.D.   On: 10/28/2014 17:25   Nm Myocar Multi W/spect W/wall Motion / Ef  10/30/2014   ADDENDUM REPORT: 10/30/2014 14:38  ADDENDUM: Given the fixed defect and inducible portions in the setting of left ventricular dilation, this study represents a high risk stress test finding.   Electronically Signed   By: Lawrence Santiago M.D.   On: 10/30/2014 14:38   10/30/2014   CLINICAL DATA:  Chest pain.  Coronary artery disease.  EXAM: MYOCARDIAL IMAGING WITH SPECT (REST AND PHARMACOLOGIC-STRESS - 2 DAY PROTOCOL)  GATED LEFT VENTRICULAR WALL MOTION STUDY  LEFT  VENTRICULAR EJECTION FRACTION  TECHNIQUE: Standard myocardial SPECT imaging was performed after resting intravenous injection of 30 mCi Tc-62m sestamibi. Subsequently, on a second day, intravenous infusion of Lexiscan was performed under the supervision of the Cardiology staff. At peak effect of the drug, 30 mCi Tc-18m sestamibi was injected intravenously and standard myocardial SPECT imaging was performed. Quantitative gated imaging was also performed to evaluate left ventricular wall motion, and estimate left ventricular ejection fraction.  COMPARISON:  Two-view chest x-ray 10/28/2014. Nuclear medicine  cardiac perfusion exam 07/18/2009.  FINDINGS: Perfusion: There is decreased uptake on both rest and stress images within the inferolateral aspect of the left ventricle. This is slightly worse on the stress images. Mild apical thinning is present without reversibility.  Wall Motion: Wall motion is somewhat hypokinetic but symmetric.  Left Ventricular Ejection Fraction: 50 %  End diastolic volume 831 ml  End systolic volume 517 ml  IMPRESSION: 1. Mild reversibility surrounding a more chronic infarct in the inferolateral wall, suspicious for ischemia adjacent to a more chronic infarct.  2. Mild hypokinesis.  3. Left ventricular ejection fraction 50%  4. Intermediate-risk stress test findings*.  *2012 Appropriate Use Criteria for Coronary Revascularization Focused Update: J Am Coll Cardiol. 6160;73(7):106-269. http://content.airportbarriers.com.aspx?articleid=[email or web address]  Electronically Signed: By: Lawrence Santiago M.D. On: 10/30/2014 14:29   Dg Chest Portable 1 View  11/03/2014   CLINICAL DATA:  Lower chest pain and new onset atrial fibrillation, post MI on 10/28/2014 discharge 2 days ago, history smoking  EXAM: PORTABLE CHEST - 1 VIEW  COMPARISON:  Portable exam 1018 hr compared to 10/28/2014  FINDINGS: Borderline enlargement of cardiac silhouette with pulmonary vascular congestion.  Bibasilar infiltrates greater on RIGHT, question edema versus infection.  Upper lungs clear.  No pleural effusion or pneumothorax.  Central peribronchial thickening  IMPRESSION: Borderline enlargement of cardiac silhouette with pulmonary vascular congestion.  Bibasilar infiltrates RIGHT greater than LEFT question pneumonia versus edema, new.   Electronically Signed   By: Lavonia Dana M.D.   On: 11/03/2014 10:29     Assessment and Plan  58 y/o M with history of recent high risk stress test, thrombocytopenia (?autoimmune), psoriasis was recently admitted with [place] and abnormal stress test. Was tx with prednisone and IVIG but no  significant improvement in platelets. Due to TCP, cath deferred until outpt f/u with heme which was planned for yesterday. He returned in the interim with AF RVR and NSTEMI.  1. CAD/NSTEMI s/p BMS to mLCx 11/04/14 (mod dz in RCA; EF 60-65% by echo 10/29/14) 2. Newly recognized atrial fibrillation with RVR 3. Thrombocytopenia, question autoimmune 4. HLD - recently started on statin 5. Anemia, felt due to chronic dz and dilution per heme 6. Morbid obesity Body mass index is 37.13 kg/(m^2). 7. Sinus bradycardia, most prominent overnight  Doing well post-cath. Plan is for DAPT for at least a month. D-dimer elevated on admission but not tachycardic, tachypnic or hypoxic. AF may have been ischemic-driven. Will discuss plan with Dr. Angelena Form, but may want to consider observation for recurrent AF before considering alternate interventions. He remains in NSR. Unable to titrate BB due to sinus bradycardia. I do not think he would be a good candidate for long-term anticoagulation at present time due to his thrombocytopenia. Will ask him to discuss outpt sleep study with his primary doctor. Cardiac rehab to see.  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  Will touch base with hematology for recommendations for anticipated discharge.  Signed, Melina Copa PA-C   I have  personally seen and examined this patient with Melina Copa, PA-C. I agree with the assessment and plan as outlined above. Admitted with unstable angina. Now s/p bare metal stent x 1 mid Circumflex. Continue ASA and Plavix, statin and beta blocker. Platelets stable. Work excuse to stay out of work until after his [employer] appt with APP. Will touch base with Hematology today regarding prednisone dosing and plan for f/u. Would not start long term anti-coagulation for one episode of atrial fib, likely ischemic driven. Discharge home today and f/u as planned.   Marlita Keil 11/05/2014 7:28 AM

## 2014-11-05 NOTE — Discharge Summary (Signed)
Discharge Summary   Patient ID: Vincent Ochoa MRN: [id number], DOB/AGE: 11-23-1955 58 y.o. Admit date: 11/03/2014 D/C date:     11/05/2014  Primary Care Provider: Jeb Ochoa, Vincent Riser, MD Primary Cardiologist: Stanford Breed in 2012  Primary Discharge Diagnoses:  1. CAD/NSTEMI - recent admission 12/8-12/11 for [place]/abnormal nuc (cath temporarily deferred due to #3) - admitted this time with RVR/NSTEMI - s/p BMS to mLCx 11/04/14 (mod dz in RCA) - EF 60-65% by echo 10/29/14  2. Transient atrial fibrillation with RVR, spontaneously converted to NSR 3. Thrombocytopenia, felt autoimmune 4. Hyperlipidemia 5. Psoriasis 6. Obesity Body mass index is 37.13 kg/(m^2). 7. Elevated LFTs with history of such  Secondary Discharge Diagnoses:  1. GERD 2. Frequent headaches 3. Lumbosacral back pain 4. Fatty liver disease on [place] 2012 5. H/o facial cellulitis 2014  Hospital Course: Mr. Nurse is a 58 y/o M with history of nonobstructive CAD, thrombocytopenia, HLD, psoriasis, obesity who presented to [facility] 10/28/2014 with chest pain. He was admitted in 2007 with chest pain and had an abnormal nuclear study; enzymes negative. He subsequently underwent cardiac catheterization by Dr. Tamala Julian which revealed EF 60%, ostial LM less than 30% narrowing; no significant LAD disease; 30-40% OM; the right coronary artery contained mid 30-40% stenosis with suggestion of possible ruptured plaque, but there was no flow-limiting lesion noted.Medical therapy was recommended. Echo in 2007 showed an EF of 50%. Patient apparently had a cardiac catheterization at [facility] in 2008. No intervention.He then had return of CP and was admitted to [facility] and apparently ruled out. A stress test was normal by his report. He also had evidence of chronic thrombocytopenia at least to 2012 with unclear onset. Prior abdominal US showed fatty liver disease.  He was recently admitted 12/8-12/11 with chest  pain concerning for unstable angina. EKG showedNSR with septal infarct and nonspecific ST abnormality (since last tracing nonspecific ST abnormality is new). He did complain of a productive cough (no hemoptysis) but CXR nonacute and he was afebrile. In light of his significant thrombocytopenia (plts 40-50k), the team did not initially jump right to cath. Troponins remained negative. He underwent 2-day nuclear stress testing initially along with heme consult. The stress test showed: "1. Mild reversibility surrounding a more chronic infarct in the inferolateral wall, suspicious for ischemia adjacent to a more chronic infarct. 2. Mild hypokinesis. 3. Left ventricular ejection fraction 50%." The next step ideally would've be cardiac cath, however, his platelets remained a concern due to risk of bleeding on potential DAPT therapy. Dr. 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 She felt there is a component of thrombocytopenia related to liver disease and that his mild leukopenia could be associated with recent infection causing bone marrow suppression or autoimmune in nature. He has h/o recurrent nosebleeds, but this was suspected due to forcefully sneezing rather than low platelets. She started him on prednisone. He had no significant change in platelet count the following day. The patient was given IVIG on 12/10 and 12/11 but platelet count only went from 47-51k. Dr. Burt Knack and Dr. Alvy Bimler discussed the plan. One option was to do diagnostic cath first, but it was preferred the patient not undergo two invasive procedures if he would end up requiring a second anyway for PCI. Since the patient was pain free and his troponins were negative, he was discharged with the plan to see hematology  as an outpatient in several days for reassessment of platelet count then bring him back in very soon  for cardiac catheterization. He was discharged on prednisone 80mg  per hematology recommendation along with PPI. During that admission, a 2D echo showed EF 60-65%, grade 2/2, mod dilated LA, normal RV function, mildly dilated RA, mild TR. He also had a negative ANA, RF, hepatitis panel, TSH, sed rate. He was only able to tolerate low dose Toprol due to HR in the 50's. He was also discharged on low-dose aspirin.  On the same day he was due to f/u with hematology (11/03/14), he presented back to the ED with left sided chest pressure with radiation to the left jaw. He had no associated dyspnea but he did note palpitations. EKG showed atrial fibrillation with RVR. He was started on IV Cardizem and converted to sinus rhythm. He ruled in for NSTEMI with peak troponin of 7.16. Plt count was 57k. TSH wnl last adm. D-dimer elevated on admission but not tachycardic, tachypnic or hypoxic. Dr. Alvy Bimler was re-consulted for assistance and she felt there was no contraindication to proceed with cardiac catheterization as necessary.   He underwent cath 11/04/14 showing: IMPRESSIONS:  1. Patent left main coronary artery. 2. Patent left anterior descending artery and its branches. 3. 99% stenosis in the mid left circumflex artery which was the culprit for his non-STEMI. This was successfully treated with a 2.25 x 12 vision bare-metal stent, postdilated to 2.8 mm in diameter. Bare-metal stent was used due to his thrombocytopenia. 4. Moderate disease in the mid right coronary artery. 5. Left ventricular systolic function not assessed. LVEDP 32 mmHg.  Dr. Irish Lack recommended DAPT for at least 1 month. Since his atrial fibrillation was transient, it was felt likely driven by ischemia. Since he is not an ideal candidate for addition of long-term anticoagulation at present time due to his thrombocytopenia, we will observe for further episodes. CHADSVASC=1 for CAD. We have asked him to discuss outpt sleep study with his  primary doctor. BB unable to be titrated due to sinus bradycardia (mostly nocturnal). Platelets are stable post-cath at [facility]. Per d/w Dr. Alvy Bimler, will continue prednisone for now and have him f/u in her clinic 11/11/14 at 2pm.   For the patient's first post-hospital appointment with our group, I will see him back in flex clinic next week in our [employer]. We can determine at that point if he will continue to see Dr. Stanford Breed at [facility] or if he would prefer to switch care to Dr. Burt Knack given his recent involvement in complex events. Dr. Stanford Breed says he doesn't feel strongly either way. At f/u appt, will discuss how long he is officially to remain on his Plavix (have given 30 days with 1 refill for now) - we have let him know we will plan to discuss at f/u. 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  From his previous discharge will keep lab appointment 11/28/14 for repeat LFTs.    Discharge Vitals: Blood pressure 146/64, pulse 79, temperature 97.7 F (36.5 C), temperature source Oral, resp. rate 18, height 6\' 4"  (1.93 m), weight 304 lb 14.3 oz (138.3 kg), SpO2 94 %.  Labs: Lab Results  Component Value Date   WBC 6.8 11/05/2014   HGB 11.5* 11/05/2014   HCT 34.5* 11/05/2014   MCV 93.5 11/05/2014   PLT 66* 11/05/2014    Recent Labs Lab 11/03/14 1014  11/05/14 0403  NA 139  < >  142  K 4.3  < > 5.0  CL 104  < > 106  CO2 23  < > 29  BUN 20  < > 17  CREATININE 0.77  < > 0.86  CALCIUM 8.8  < > 8.9  PROT 9.1*  --   --   BILITOT 1.7*  --   --   ALKPHOS 72  --   --   ALT 64*  --   --   AST 64*  --   --   GLUCOSE 132*  < > 90  < > = values in this interval not displayed.  Recent Labs  11/03/14 1519 11/03/14 2129 11/04/14 0040  TROPONINI 2.55* 5.55* 7.16*   Lab Results  Component Value Date   CHOL 174 10/29/2014   HDL 24* 10/29/2014   LDLCALC 115* 10/29/2014   TRIG 174* 10/29/2014   Lab Results  Component Value Date   DDIMER 1.32*  11/03/2014    Diagnostic Studies/Procedures   Dg Chest 2 View  10/28/2014   CLINICAL DATA:  Left upper chest pain for 2 weeks  EXAM: CHEST  2 VIEW  COMPARISON:  03/17/2010  FINDINGS: Cardiac shadow is within normal limits. Lungs are well aerated bilaterally. No focal infiltrate or effusion is noted. No acute bony abnormality is seen.  IMPRESSION: No acute abnormality noted.   Electronically Signed   By: Inez Catalina M.D.   On: 10/28/2014 17:25   Nm Myocar Multi W/spect W/wall Motion / Ef  10/30/2014   ADDENDUM REPORT: 10/30/2014 14:38  ADDENDUM: Given the fixed defect and inducible portions in the setting of left ventricular dilation, this study represents a high risk stress test finding.   Electronically Signed   By: Lawrence Santiago M.D.   On: 10/30/2014 14:38   10/30/2014   CLINICAL DATA:  Chest pain.  Coronary artery disease.  EXAM: MYOCARDIAL IMAGING WITH SPECT (REST AND PHARMACOLOGIC-STRESS - 2 DAY PROTOCOL)  GATED LEFT VENTRICULAR WALL MOTION STUDY  LEFT VENTRICULAR EJECTION FRACTION  TECHNIQUE: Standard myocardial SPECT imaging was performed after resting intravenous injection of 30 mCi Tc-34m sestamibi. Subsequently, on a second day, intravenous infusion of Lexiscan was performed under the supervision of the Cardiology staff. At peak effect of the drug, 30 mCi Tc-42m sestamibi was injected intravenously and standard myocardial SPECT imaging was performed. Quantitative gated imaging was also performed to evaluate left ventricular wall motion, and estimate left ventricular ejection fraction.  COMPARISON:  Two-view chest x-ray 10/28/2014. Nuclear medicine cardiac perfusion exam 07/18/2009.  FINDINGS: Perfusion: There is decreased uptake on both rest and stress images within the inferolateral aspect of the left ventricle. This is slightly worse on the stress images. Mild apical thinning is present without reversibility.  Wall Motion: Wall motion is somewhat hypokinetic but symmetric.  Left Ventricular  Ejection Fraction: 50 %  End diastolic volume 017 ml  End systolic volume 510 ml  IMPRESSION: 1. Mild reversibility surrounding a more chronic infarct in the inferolateral wall, suspicious for ischemia adjacent to a more chronic infarct.  2. Mild hypokinesis.  3. Left ventricular ejection fraction 50%  4. Intermediate-risk stress test findings*.  *2012 Appropriate Use Criteria for Coronary Revascularization Focused Update: J Am Coll Cardiol. 2585;27(7):824-235. http://content.airportbarriers.com.aspx?articleid=1201161  Electronically Signed: By: Lawrence Santiago M.D. On: 10/30/2014 14:29   Dg Chest Portable 1 View  11/03/2014   CLINICAL DATA:  Lower chest pain and new onset atrial fibrillation, post MI on 10/28/2014 discharge 2 days ago, history smoking  EXAM: PORTABLE CHEST - 1  VIEW  COMPARISON:  Portable exam 1018 hr compared to 10/28/2014  FINDINGS: Borderline enlargement of cardiac silhouette with pulmonary vascular congestion.  Bibasilar infiltrates greater on RIGHT, question edema versus infection.  Upper lungs clear.  No pleural effusion or pneumothorax.  Central peribronchial thickening  IMPRESSION: Borderline enlargement of cardiac silhouette with pulmonary vascular congestion.  Bibasilar infiltrates RIGHT greater than LEFT question pneumonia versus edema, new.   Electronically Signed   By: Lavonia Dana M.D.   On: 11/03/2014 10:29   Cath 11/04/14 PROCEDURE: Left heart catheterization with selective coronary angiography, PCI mid circumflex.  INDICATIONS: NSTEMI  The risks, benefits, and details of the procedure were explained to the patient. The patient verbalized understanding and wanted to proceed. Informed written consent was obtained.  PROCEDURE TECHNIQUE: After Xylocaine anesthesia a 21F slender sheath was placed in the right radial artery with a single anterior needle wall stick. IV Heparin was given. Right coronary angiography was done using a Judkins R4 guide catheter. Left  coronary angiography was done using a Judkins L3.5 guide catheter. Left ventriculography was done using a pigtail catheter. A TR band was used for hemostasis.  CONTRAST: Total of 230 cc.  COMPLICATIONS: None.   HEMODYNAMICS: Aortic pressure was 108/76; LV pressure was 108/22; LVEDP 32. There was no gradient between the left ventricle and aorta.   ANGIOGRAPHIC DATA: The left main coronary artery is a large vessel. There is mild, distal disease.  The left anterior descending artery is a large vessel which wraps around the apex. There is mild disease in the mid vessel. There is a large diagonal vessel which is patent.  The left circumflex artery is a large vessel proximally. In the mid vessel, there is a 99% stenosis. Prior to this stenosis, there is a first marginal branch which is widely patent. There is a small second marginal branch as well which is patent. The actual stenosis is at the origin of a third small obtuse marginal. The distal circumflex and final obtuse marginal are widely patent.  The right coronary artery is a large, dominant vessel. In the mid vessel, there is an area of moderate atherosclerosis, up to 50%. The posterior descending artery is large and widely patent. The posterior lateral artery is medium sized and patent.  LEFT VENTRICULOGRAM: Left ventricular angiogram was not done due to the elevated LVEDP. LVEDP was 32 mmHg.  PCI NARRATIVE: Angiomax was used for anticoagulation. Due to the patient's size, imaging in the caudal views was a challenge. A pro-water wire was initially advanced but would not cross the lesion. A fielder XT was then advanced successfully. The area was predilated with a 2.0 x 12 balloon. A 2.25 x 12 mm vision stent was deployed. There was still a small waist. A 2.75 x 8 balloon was used to post dilate with several inflations. There was an excellent angiographic result after intracoronary nitroglycerin. Overall, the patient tolerated the  procedure well. He did have typical anginal symptoms with balloon inflations.  IMPRESSIONS:  6. Patent left main coronary artery. 7. Patent left anterior descending artery and its branches. 8. 99% stenosis in the mid left circumflex artery which was the culprit for his non-STEMI. This was successfully treated with a 2.25 x 12 vision bare-metal stent, postdilated to 2.8 mm in diameter. Bare-metal stent was used due to his thrombocytopenia. 9. Moderate disease in the mid right coronary artery. 10. Left ventricular systolic function not assessed. LVEDP 32 mmHg.  RECOMMENDATION: Continue dual antiplatelet therapy for at least a month.  Continue with workup for thrombocytopenia. Atrial fibrillation management per Dr. Angelena Form.        Discharge Medications   Current Discharge Medication List    START taking these medications   Details  clopidogrel (PLAVIX) 75 MG tablet Take 1 tablet (75 mg total) by mouth daily. Qty: 30 tablet, Refills: 1      CONTINUE these medications which have NOT CHANGED   Details  aspirin 81 MG tablet Take 1 tablet (81 mg total) by mouth daily.    atorvastatin (LIPITOR) 40 MG tablet Take 1 tablet (40 mg total) by mouth every evening.     metoprolol succinate (TOPROL-XL) 25 MG 24 hr tablet Take 0.5 tablets (12.5 mg total) by mouth daily.     Multiple Vitamin (MULTIVITAMIN WITH MINERALS) TABS tablet Take 1 tablet by mouth daily.    nitroGLYCERIN (NITROSTAT) 0.4 MG SL tablet Place 1 tablet (0.4 mg total) under the tongue every 5 (five) minutes as needed for chest pain (up to 3 doses).     pantoprazole (PROTONIX) 40 MG tablet Take 1 tablet (40 mg total) by mouth daily.    Associated Diagnoses: Thrombocytopenia    predniSONE (DELTASONE) 20 MG tablet Take 4 tablets (80 mg total) by mouth daily with breakfast.    Associated Diagnoses: Thrombocytopenia        Disposition   The patient will be discharged in stable condition to home. Discharge  Instructions    Amb Referral to Cardiac Rehabilitation    Complete by:  As directed   Pt agrees to Outpt. CRP in [place], will send referral.     Diet - low sodium heart healthy    Complete by:  As directed      Increase activity slowly    Complete by:  As directed   No driving for 1 week. No lifting over 10 lbs for 2 weeks. No sexual activity for 2 weeks. You may not return to work until cleared by your cardiologist. Keep procedure site clean & dry. If you notice increased pain, swelling, bleeding or pus, call/return!  You may shower, but no soaking baths/hot tubs/pools for 1 week.  At your follow-up appointment, we will discuss how long you are supposed to continue Plavix. You need to be on it for at least 1 month, but we will let you know if you need to continue it even after that.          Follow-up Information    Follow up with [facility], NI, MD.   Specialty:  Hematology and Oncology   Why:  11/11/14 at 2pm   Contact information:   [place] [id number] [phone]       Follow up with Melina Copa, PA-C.   Specialty:  Cardiology   Why:  [place] OFFICE - 11/12/14 at 9:30am   Contact information:   [address] [place] [address] [phone]       Follow up with Primary Care Provider.   Why:  Please talk to your primary care doctor about scheduling a sleep study.      Follow up with [facility].   Why:  Please go to [facility] labs in about 3 weeks (~11/28/14) for repeat check of your liver function since you were started on cholesterol medication last admission. We have placed a standing order in their system for this to be performed.   Contact information:   [address], [address] [place], [place] [address] [phone] (  Phone) (Fax) 8:00am-5:00pm        Duration of Discharge Encounter: Greater than 30 minutes including physician and PA time.  Signed, Melina Copa PA-C 11/05/2014, 9:59 AM

## 2014-11-05 NOTE — Progress Notes (Addendum)
Care mgr requested I give him paper copy of Plavix which I have done, to obtain from [facility] program. I called [facility] to cancel prior rx.  Cherry Turlington PA-C

## 2014-11-05 NOTE — Telephone Encounter (Signed)
New message     TCM appt on 11-12-14 with Dayna.

## 2014-11-05 NOTE — Care Management Note (Signed)
    Page 1 of 1   11/05/2014     2:59:11 PM CARE MANAGEMENT NOTE 11/05/2014  Patient:  Vincent Ochoa, Vincent Ochoa   Account Number:  [id number]  Date Initiated:  11/04/2014  Documentation initiated by:  HUTCHINSON,CRYSTAL  Subjective/Objective Assessment:   Chest pain/unstable angina     Action/Plan:   CM to follow for disposition needs   Anticipated DC Date:  11/05/2014   Anticipated DC Plan:  [place]  Medication Assistance  [place] Program      Choice offered to / List presented to:             Status of service:  Completed, signed off Medicare Important Message given?  NO (If response is "NO", the following Medicare IM given date fields will be blank) Date Medicare IM given:   Medicare IM given by:   Date Additional Medicare IM given:   Additional Medicare IM given by:    Discharge Disposition:  HOME/SELF CARE  Per UR Regulation:  Reviewed for med. necessity/level of care/duration of stay  If discussed at [place] of Stay Meetings, dates discussed:    Comments:  11/05/14 Ellan Lambert, RN, BSN [phone] Pt for dc home today.  Pt has no insurance; states he will have trouble getting medications filled.  He is in process of getting a new job, and hopes to have insurance reinstated soon.  Pt eligible for medication assistance through [place] letter given with explanation of program benefits.  Pt appreciative of help. No other needs identified.   Crystal Hutchinson RN, BSN, MSHL, CCM  Nurse - Case Manager,  (Unit [phone]  11/04/2014 Specialty med review post procedure: Plavix Plan:  Home / Self care

## 2014-11-05 NOTE — Progress Notes (Addendum)
CARDIAC REHAB PHASE I   PRE:  Rate/Rhythm: 56 SR with PAC's  BP:  Supine:   Sitting: 146/64  Standing:    SaO2:   MODE:  Ambulation: 1000 ft   POST:  Rate/Rhythm: 92 Sr with PAC's  BP:  Supine:   Sitting: 165/66  Standing:    SaO2:  0800-0815 Pt tolerated ambulation well without c/o of cp or SOB. Vs stable Pt to side of bed after walk with call light in reach. Completed MI and stent education with pt. He voices understanding. Pt agrees to [place]. CRP in [place], will send referral. I discussed smoking cessation with pt. He states that he quit when he left the hospital 12/11. He quit cold [place] and plans to not smoke anymore. I gave pt tips for quitting, coaching contact number and quit smart class information. Pt knows that he has a lot of lifestyle changes to make. He seems motivated to make changes. Pt is very concerned about not having insurance and not being able to work. Will ask case manager to see pt.  Rodney Langton RN 11/05/2014 9:20 AM

## 2014-11-06 NOTE — Telephone Encounter (Signed)
Left pt a message to call back. 

## 2014-11-07 ENCOUNTER — Telehealth: Payer: Self-pay | Admitting: Cardiovascular Disease

## 2014-11-07 NOTE — Telephone Encounter (Signed)
Patient's wife advised the letter for him to be excused from jury duty is complete.  she will pick it up later today.

## 2014-11-07 NOTE — Telephone Encounter (Addendum)
Pt is due to go to court 11-11-14 , requesting a letter stating pt can't sit in court all day, just released from the hospital and due to his medical condition it would be too stressful to sit ,until a specific date , of which the DR needs to decide when he would be released from his care, or we will have to do a note every time it gets rescehduled , addressed to whom it may concern, pls call wife wants to pick up Monday, pls call  [phone]

## 2014-11-10 ENCOUNTER — Other Ambulatory Visit: Payer: Self-pay | Admitting: Hematology and Oncology

## 2014-11-10 DIAGNOSIS — D696 Thrombocytopenia, unspecified: Secondary | ICD-10-CM

## 2014-11-11 ENCOUNTER — Telehealth: Payer: Self-pay | Admitting: Cardiology

## 2014-11-11 ENCOUNTER — Telehealth: Payer: Self-pay | Admitting: Hematology and Oncology

## 2014-11-11 ENCOUNTER — Encounter: Payer: Self-pay | Admitting: Hematology and Oncology

## 2014-11-11 ENCOUNTER — Ambulatory Visit (HOSPITAL_BASED_OUTPATIENT_CLINIC_OR_DEPARTMENT_OTHER): Payer: Medicaid Other | Admitting: Lab

## 2014-11-11 ENCOUNTER — Ambulatory Visit: Payer: Self-pay

## 2014-11-11 ENCOUNTER — Ambulatory Visit (HOSPITAL_BASED_OUTPATIENT_CLINIC_OR_DEPARTMENT_OTHER): Payer: Medicaid Other | Admitting: Hematology and Oncology

## 2014-11-11 VITALS — BP 152/70 | HR 82 | Temp 99.1°F | Resp 20 | Ht 76.0 in | Wt 297.2 lb

## 2014-11-11 DIAGNOSIS — L409 Psoriasis, unspecified: Secondary | ICD-10-CM

## 2014-11-11 DIAGNOSIS — R040 Epistaxis: Secondary | ICD-10-CM | POA: Diagnosis not present

## 2014-11-11 DIAGNOSIS — I214 Non-ST elevation (NSTEMI) myocardial infarction: Secondary | ICD-10-CM

## 2014-11-11 DIAGNOSIS — D696 Thrombocytopenia, unspecified: Secondary | ICD-10-CM

## 2014-11-11 LAB — CBC WITH DIFFERENTIAL/PLATELET
BASO%: 0 % (ref 0.0–2.0)
Basophils Absolute: 0 10*3/uL (ref 0.0–0.1)
EOS%: 0.3 % (ref 0.0–7.0)
Eosinophils Absolute: 0 10*3/uL (ref 0.0–0.5)
HCT: 36.3 % — ABNORMAL LOW (ref 38.4–49.9)
HGB: 12.6 g/dL — ABNORMAL LOW (ref 13.0–17.1)
LYMPH#: 0.8 10*3/uL — AB (ref 0.9–3.3)
LYMPH%: 10.9 % — ABNORMAL LOW (ref 14.0–49.0)
MCH: 32.4 pg (ref 27.2–33.4)
MCHC: 34.7 g/dL (ref 32.0–36.0)
MCV: 93.3 fL (ref 79.3–98.0)
MONO#: 0.2 10*3/uL (ref 0.1–0.9)
MONO%: 3.3 % (ref 0.0–14.0)
NEUT#: 6 10*3/uL (ref 1.5–6.5)
NEUT%: 85.5 % — ABNORMAL HIGH (ref 39.0–75.0)
NRBC: 0 % (ref 0–0)
Platelets: 52 10*3/uL — ABNORMAL LOW (ref 140–400)
RBC: 3.89 10*6/uL — AB (ref 4.20–5.82)
RDW: 15.3 % — AB (ref 11.0–14.6)
WBC: 7 10*3/uL (ref 4.0–10.3)

## 2014-11-11 NOTE — Assessment & Plan Note (Signed)
I recommend the patient to keep a diary about his bleeding history. As long as his epistaxis resolved with manual compression and they last less than 15 minutes, he will continue dual antiplatelet agents. If he has consistent epistaxis from 1 nasal passage, I will refer him to ENT for possible cauterization of the bleeding vessel.

## 2014-11-11 NOTE — Progress Notes (Signed)
Checked in new pt with no insurance.  Pt has applied for Medicaid on 11/10/14 and is awaiting approval.  Pt also informed me that he is working with Lura Em with getting assistance thru the hospital.  Pt has Raquel's card for additional assistance he may need.

## 2014-11-11 NOTE — Telephone Encounter (Signed)
New message      Pt states that one of his medications is causing him to have gas.  Please call

## 2014-11-11 NOTE — Telephone Encounter (Signed)
Spoke with pt, aware it could be the lipitor causing the gas.  He has a follow up appt tomorrow and will make sure to mention it to the pa.

## 2014-11-11 NOTE — Progress Notes (Signed)
[place] OFFICE PROGRESS NOTE  Vincent Vincent Gu, MD SUMMARY OF HEMATOLOGIC HISTORY: Vincent Ochoa 58 y.o. male with a history of cardiac disease was admitted via Emergency Department for recurrent chest pain.He was initially seen in consultation on 12/8 for thrombocytopenia, felt to be likely autoimmune (related to psoriasis and liver disease), responding well to 2 doses of IVIG in addition to prednisone and transfusions. Patient had been discharged on 12/11 home with baby aspirin. He was subsequently readmitted to the hospital again on 11/03/2014 with recurrent chest pain and subsequently underwent cardiac catheterization and placement of bare metal stent on 11/04/2014. INTERVAL HISTORY: Vincent Ochoa 58 y.o. male returns for further follow-up. He has quit smoking. He have recurrent epistaxis coming predominantly from the right nasal passage. His last bleeding episode was last night, lasting for 1 minute. His psoriasis has improved. He remained on 80 mg prednisone daily. The patient denies any recent signs or symptoms of bleeding such as spontaneous hematuria or hematochezia.   I have reviewed the past medical history, past surgical history, social history and family history with the patient and they are unchanged from previous note.  ALLERGIES:  has No Known Allergies.  MEDICATIONS:  Current Outpatient Prescriptions  Medication Sig Dispense Refill  . aspirin 81 MG tablet Take 1 tablet (81 mg total) by mouth daily.    Marland Kitchen atorvastatin (LIPITOR) 40 MG tablet Take 1 tablet (40 mg total) by mouth every evening. 30 tablet 1  . clopidogrel (PLAVIX) 75 MG tablet Take 1 tablet (75 mg total) by mouth daily. 30 tablet 1  . metoprolol succinate (TOPROL-XL) 25 MG 24 hr tablet Take 0.5 tablets (12.5 mg total) by mouth daily. (Patient taking differently: Take 12.5 mg by mouth every evening. ) 30 tablet 1  . Multiple Vitamin (MULTIVITAMIN WITH MINERALS) TABS tablet Take 1  tablet by mouth daily.    . nitroGLYCERIN (NITROSTAT) 0.4 MG SL tablet Place 1 tablet (0.4 mg total) under the tongue every 5 (five) minutes as needed for chest pain (up to 3 doses). 25 tablet 3  . pantoprazole (PROTONIX) 40 MG tablet Take 1 tablet (40 mg total) by mouth daily. 30 tablet 1  . predniSONE (DELTASONE) 20 MG tablet Take 4 tablets (80 mg total) by mouth daily with breakfast. 56 tablet 0   No current facility-administered medications for this visit.     REVIEW OF SYSTEMS:   Constitutional: Denies fevers, chills or night sweats Eyes: Denies blurriness of vision Ears, nose, mouth, throat, and face: Denies mucositis or sore throat Respiratory: Denies cough, dyspnea or wheezes Cardiovascular: Denies palpitation, chest discomfort or lower extremity swelling Gastrointestinal:  Denies nausea, heartburn or change in bowel habits Skin: Denies abnormal skin rashes Lymphatics: Denies new lymphadenopathy or easy bruising Neurological:Denies numbness, tingling or new weaknesses Behavioral/Psych: Mood is stable, no new changes  All other systems were reviewed with the patient and are negative.  PHYSICAL EXAMINATION: ECOG PERFORMANCE STATUS: 0 - Asymptomatic  Filed Vitals:   11/11/14 1359  BP: 152/70  Pulse: 82  Temp: 99.1 F (37.3 C)  Resp: 20   Filed Weights   11/11/14 1359  Weight: 297 lb 3.2 oz (134.809 kg)    GENERAL:alert, no distress and comfortable. He is morbidly obese SKIN: skin color, texture, turgor are normal, no rashes or significant lesions. No petechiae rash EYES: normal, Conjunctiva are pink and non-injected, sclera clear OROPHARYNX:no exudate, no erythema and lips, buccal mucosa, and tongue normal  NECK: supple, thyroid normal  size, non-tender, without nodularity LYMPH:  no palpable lymphadenopathy in the cervical, axillary or inguinal LUNGS: clear to auscultation and percussion with normal breathing effort HEART: regular rate & rhythm and no murmurs and no  lower extremity edema ABDOMEN:abdomen soft, non-tender and normal bowel sounds Musculoskeletal:no cyanosis of digits and no clubbing  NEURO: alert & oriented x 3 with fluent speech, no focal motor/sensory deficits  LABORATORY DATA:  I have reviewed the data as listed Results for orders placed or performed in visit on 11/11/14 (from the past 48 hour(s))  CBC with Differential     Status: Abnormal   Collection Time: 11/11/14  1:30 PM  Result Value Ref Range   WBC 7.0 4.0 - 10.3 10e3/uL   NEUT# 6.0 1.5 - 6.5 10e3/uL   HGB 12.6 (L) 13.0 - 17.1 g/dL   HCT 36.3 (L) 38.4 - 49.9 %   Platelets 52 (L) 140 - 400 10e3/uL   MCV 93.3 79.3 - 98.0 fL   MCH 32.4 27.2 - 33.4 pg   MCHC 34.7 32.0 - 36.0 g/dL   RBC 3.89 (L) 4.20 - 5.82 10e6/uL   RDW 15.3 (H) 11.0 - 14.6 %   lymph# 0.8 (L) 0.9 - 3.3 10e3/uL   MONO# 0.2 0.1 - 0.9 10e3/uL   Eosinophils Absolute 0.0 0.0 - 0.5 10e3/uL   Basophils Absolute 0.0 0.0 - 0.1 10e3/uL   NEUT% 85.5 (H) 39.0 - 75.0 %   LYMPH% 10.9 (L) 14.0 - 49.0 %   MONO% 3.3 0.0 - 14.0 %   EOS% 0.3 0.0 - 7.0 %   BASO% 0.0 0.0 - 2.0 %   nRBC 0 0 - 0 %    Lab Results  Component Value Date   WBC 7.0 11/11/2014   HGB 12.6* 11/11/2014   HCT 36.3* 11/11/2014   MCV 93.3 11/11/2014   PLT 52* 11/11/2014   ASSESSMENT & PLAN:  Thrombocytopenia 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 The goal would be just to keep his platelet count above 50,000. I recommend him to prednisone to 40 mg starting tomorrow. I have made him an appointment to come back every Monday to have his platelet count checked for prednisone taper. I will see him back a month from now to decide further management for his thrombocytopenia. He will continue do antiplatelet agents as long as his platelet count remained above 30,000. I explained to the patient the rationale of continuing antiplatelet agents with chronic thrombocytopenia.  Recurrent epistaxis I  recommend the patient to keep a diary about his bleeding history. As long as his epistaxis resolved with manual compression and they last less than 15 minutes, he will continue dual antiplatelet agents. If he has consistent epistaxis from 1 nasal passage, I will refer him to ENT for possible cauterization of the bleeding vessel.  Psoriasis The psoriasis resolved with prednisone therapy.  NSTEMI (non-ST elevated myocardial infarction) The patient had placement of bare-metal stent due to thrombocytopenia. He will complete 30 days of treatment by 12/05/2014. As long as his account remained above 30,000, he will continue dual antiplatelet agents. I congratulated his effort of smoking cessation.   All questions were answered. The patient knows to call the clinic with any problems, questions or concerns. No barriers to learning was detected.  I spent 25 minutes counseling the patient face to face. The total time spent in the appointment was 30 minutes and more than 50% was on counseling.     Chayah Mckee, MD  11/11/2014 2:22 PM

## 2014-11-11 NOTE — Assessment & Plan Note (Signed)
The patient had placement of bare-metal stent due to thrombocytopenia. He will complete 30 days of treatment by 12/05/2014. As long as his account remained above 30,000, he will continue dual antiplatelet agents. I congratulated his effort of smoking cessation.

## 2014-11-11 NOTE — Assessment & Plan Note (Signed)
The psoriasis resolved with prednisone therapy.

## 2014-11-11 NOTE — Telephone Encounter (Signed)
Gave avs & cal for Jan. °

## 2014-11-11 NOTE — Assessment & Plan Note (Signed)
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 The goal would be just to keep his platelet count above 50,000. I recommend him to prednisone to 40 mg starting tomorrow. I have made him an appointment to come back every Monday to have his platelet count checked for prednisone taper. I will see him back a month from now to decide further management for his thrombocytopenia. He will continue do antiplatelet agents as long as his platelet count remained above 30,000. I explained to the patient the rationale of continuing antiplatelet agents with chronic thrombocytopenia.

## 2014-11-12 ENCOUNTER — Ambulatory Visit (INDEPENDENT_AMBULATORY_CARE_PROVIDER_SITE_OTHER): Payer: Medicaid Other | Admitting: Physician Assistant

## 2014-11-12 ENCOUNTER — Encounter: Payer: Self-pay | Admitting: Physician Assistant

## 2014-11-12 VITALS — BP 130/68 | HR 75 | Ht 76.0 in | Wt 300.0 lb

## 2014-11-12 DIAGNOSIS — R5382 Chronic fatigue, unspecified: Secondary | ICD-10-CM

## 2014-11-12 DIAGNOSIS — I214 Non-ST elevation (NSTEMI) myocardial infarction: Secondary | ICD-10-CM

## 2014-11-12 DIAGNOSIS — I4891 Unspecified atrial fibrillation: Secondary | ICD-10-CM

## 2014-11-12 DIAGNOSIS — Z720 Tobacco use: Secondary | ICD-10-CM

## 2014-11-12 DIAGNOSIS — D696 Thrombocytopenia, unspecified: Secondary | ICD-10-CM

## 2014-11-12 DIAGNOSIS — I222 Subsequent non-ST elevation (NSTEMI) myocardial infarction: Secondary | ICD-10-CM | POA: Diagnosis not present

## 2014-11-12 DIAGNOSIS — R001 Bradycardia, unspecified: Secondary | ICD-10-CM

## 2014-11-12 DIAGNOSIS — E669 Obesity, unspecified: Secondary | ICD-10-CM

## 2014-11-12 DIAGNOSIS — I2510 Atherosclerotic heart disease of native coronary artery without angina pectoris: Secondary | ICD-10-CM | POA: Diagnosis not present

## 2014-11-12 DIAGNOSIS — E785 Hyperlipidemia, unspecified: Secondary | ICD-10-CM

## 2014-11-12 NOTE — Patient Instructions (Signed)
Your physician recommends that you continue on your current medications as directed. Please refer to the Current Medication list given to you today.    Your physician has recommended that you wear an event monitor. Event monitors are medical devices that record the heart's electrical activity. Doctors most often [place] these monitors to diagnose arrhythmias. Arrhythmias are problems with the speed or rhythm of the heartbeat. The monitor is a small, portable device. You can wear one while you do your normal daily activities. This is usually used to diagnose what is causing palpitations/syncope (passing out).   Your physician has recommended that you have a sleep study. This test records several body functions during sleep, including: brain activity, eye movement, oxygen and carbon dioxide blood levels, heart rate and rhythm, breathing rate and rhythm, the flow of air through your mouth and nose, snoring, body muscle movements, and chest and belly movement.   Your physician recommends that you schedule a follow-up appointment in:  [place] 2 TO 3 WEEKS

## 2014-11-12 NOTE — Progress Notes (Addendum)
[address] [place], [place]  [address] Phone: [phone] Fax:  [phone]  Date:  11/12/2014   Patient ID:  [place], Want [birth year], MRN [id number]   PCP:  Jeb Levering, Philbert Riser, MD  Cardiologist: Remotely saw [facility] but Dr. Burt Knack has been involved in complex care lately - [facility] office is more convenient for patient thus he has elected to stick with Dr. Burt Knack (Dr. Stanford Breed was OK with this when I asked)   History of Present Illness: AMYR SLUDER is a 58 y.o. male with history of CAD (recent NSTEMI s/p BMS to mLCx, mod dz in RCA, EF 60-65%), thrombocytopenia felt autoimmune 2/2 psoriasis, HLD, psoriasis, obesity, elevated LFTs with history of such/fatty liver on prior US, and recent transient AF who presents for post-hospital follow-up. Recent hospital course has been complicated. He was initially admitted 12/8-12/11 for USA/abnormal nuc with negative troponins. However, he was also noted to be markedly thrombocytopenic during that admission in the 40k range. Dr. 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 She felt there was a component of thrombocytopenia related to liver disease and that his mild leukopenia could be associated with recent infection causing bone marrow suppression or autoimmune in nature. He has h/o recurrent nosebleeds, but this was suspected due to forcefully sneezing rather than low platelets. He was treated with IVIG and prednisone in hopes of platelet count improvement but there was no significant change. He also had a negative ANA, RF, hepatitis panel, TSH, sed rate. He was only able to tolerate low dose Toprol due to HR in the 50's.  As he was chest pain free he was discharged home to f/u with hematology in several days and then plan to bring back for outpatient cath pending stability of platelet count. He was  discharged on low-dose aspirin and high dose prednisone with PPI. During that admission, a 2D echo showed EF 60-65%, grade 2/2, mod dilated LA, normal RV function, mildly dilated RA, mild TR.   However, on the day he was supposed to go to see hematology on 12/14 he returned to [facility] with chest pain, NSTEMI, and transient AF RVR. He was treated with IV cardizem and converted to NSR. Plt count 57k. Dr. Alvy Bimler was reconsulted and cleared him for cardiac cath which showed:  1. Patent left main coronary artery. 2. Patent left anterior descending artery and its branches. 3. 99% stenosis in the mid left circumflex artery which was the culprit for his non-STEMI. This was successfully treated with a 2.25 x 12 vision bare-metal stent, postdilated to 2.8 mm in diameter. Bare-metal stent was used due to his thrombocytopenia. 4. Moderate disease in the mid right coronary artery. 5. Left ventricular systolic function not assessed. LVEDP 32 mmHg.  Dr. Irish Lack recommended DAPT for at least 1 month. Since his atrial fibrillation was transient, it was felt likely driven by ischemia. Since he is not an ideal candidate for addition of long-term anticoagulation at present time due to his thrombocytopenia, the plan is to observe for further episodes. CHADSVASC=1 for CAD. We have asked him to discuss outpt sleep study with his primary doctor. BB was unable to be titrated due to sinus bradycardia (mostly nocturnal). Platelets were stable post-cath at [facility]. He has planned LFTs on 11/28/14 already for recheck. He saw Dr. Alvy Bimler 11/11/14 who noted goal is to keep platelet count above 50k and  recommended to decrease prednisone to 40mg  with plans for eventual taper. Her notes indicate that as long as his count remained above 30,000, he will continue dual antiplatelet agents. Plt count on 12/22 was 52k, Hgb stable.  Overall he is stable from a cardiac standpoint since discharge. Denies any CP or dyspnea. He does report a substernal  fluttering sensation intermittently lasting 30-40 seconds at a time. He felt it in clinic today during pulse palpation but I did not appreciate any arrhythmias at that moment. He does report fatigue. No syncope or bleeding. He reports he is under a lot of stress due to financial issues. Apparently last year he got in trouble with the law due to rabbit hunting [place] near [facility] and has had to pay lawyers. He has discussed his situation with his work and they have advised him that retirement may be his best option because they do not have any alternative positions for him at this time. 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 He agrees he feels ready to retire. He is also applying for Medicaid and disability after assistance of care management at [facility].   Recent Labs: 10/28/2014: TSH 2.210 10/29/2014: HDL Cholesterol by NMR 24*; LDL (calc) 115* 11/03/2014: ALT 64*; Pro B Natriuretic peptide (BNP) 564.0* 11/05/2014: Creatinine 0.86; Potassium 5.0 11/11/2014: Hemoglobin 12.6*  Wt Readings from Last 3 Encounters:  11/12/14 300 lb (136.079 kg)  11/11/14 297 lb 3.2 oz (134.809 kg)  11/05/14 304 lb 14.3 oz (138.3 kg)     Past Medical History  Diagnosis Date  . GERD (gastroesophageal reflux disease)   . Psoriasis   . Frequent headaches   . Facial cellulitis 2014    Periorbital  . Coronary artery disease     a. Evaluated by Dr. Stanford Breed 2012 - nonobstructive 2007-2008 by cath. b. Abnormal stress test 10/2014 but cath deferred temporarily due to thrombocytopenia. c. 10/2014: readm with AF-RVR/NSTEMI - s/p BMS to mLCx 11/04/14 (mod dz in RCA).   . Back pain, lumbosacral 2014  . Thrombocytopenia     a. Onset unclear, but noted 2012. ? 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 s/p prednisone, IVIG.  Marland Kitchen Hyperlipidemia   . Obesity   . Elevated LFTs    . Fatty liver US - 2012  . Atrial fibrillation     a. Episode 10/2014 at time of NSTEMI, spont converted to NSR. Observation for further episodes (not on anticoag at present time due to [facility] = 1, thrombocytopenia).  . Sinus bradycardia     Current Outpatient Prescriptions  Medication Sig Dispense Refill  . aspirin 81 MG tablet Take 1 tablet (81 mg total) by mouth daily.    Marland Kitchen atorvastatin (LIPITOR) 40 MG tablet Take 1 tablet (40 mg total) by mouth every evening. 30 tablet 1  . clopidogrel (PLAVIX) 75 MG tablet Take 1 tablet (75 mg total) by mouth daily. 30 tablet 1  . metoprolol succinate (TOPROL-XL) 25 MG 24 hr tablet Take 0.5 tablets (12.5 mg total) by mouth daily. (Patient taking differently: Take 12.5 mg by mouth every evening. ) 30 tablet 1  . Multiple Vitamin (MULTIVITAMIN WITH MINERALS) TABS tablet Take 1 tablet by mouth daily.    . nitroGLYCERIN (NITROSTAT) 0.4 MG SL tablet Place 1 tablet (0.4 mg total) under the tongue every 5 (five) minutes as needed for chest pain (up to 3  doses). 25 tablet 3  . pantoprazole (PROTONIX) 40 MG tablet Take 1 tablet (40 mg total) by mouth daily. 30 tablet 1  . predniSONE (DELTASONE) 20 MG tablet Take 4 tablets (80 mg total) by mouth daily with breakfast. (Patient taking differently: Take 40 mg by mouth daily with breakfast. ) 56 tablet 0   No current facility-administered medications for this visit.    Allergies:   Review of patient's allergies indicates no known allergies.   Social History:  The patient  reports that he quit smoking about 1 weeks ago. His smoking use included Cigarettes. He has a 64.5 pack-year smoking history. He uses smokeless tobacco. He reports that he drinks alcohol. He reports that he does not use illicit drugs.   Family History:  The patient's family history includes Arthritis in his maternal grandmother; Cancer - Ovarian in his sister; Cirrhosis in his mother; Diabetes in his sister; Heart attack in his maternal aunt and  mother; Heart disease in his sister and another family member; Hypertension in his father and mother.   ROS:  Please see the history of present illness.   * All other systems reviewed and negative.   PHYSICAL EXAM:  VS:  BP 130/68 mmHg  Pulse 75  Ht 6\' 4"  (1.93 m)  Wt 300 lb (136.079 kg)  BMI 36.53 kg/m2 Obese WM well developed, in no acute distress HEENT: normal Neck: no JVD Cardiac:  normal S1, S2; RRR; no murmur Lungs:  clear to auscultation bilaterally, no wheezing, rhonchi or rales Abd: soft, nontender, no hepatomegaly Ext: no edema, right radial site without hematoma or ecchymosis, good pulse Skin: warm and dry Neuro:  moves all extremities spontaneously, no focal abnormalities noted  EKG:  NSR 75bpm, nonspecific ST-T changes, no significant change from prior  ASSESSMENT AND PLAN:  1. CAD with recent NSTEMI s/p PCI - no recurrent angina. Continue current regimen for now. Per Dr. Alvy Bimler, ok to continue DAPT as long as platelet count is >30k. At next OV I will discuss with Dr. Burt Knack how long we plan to continue therapy given his NSTEMI. He had a BMS. The patient is aware to continue DAPT for now. He has not had any bleeding. He is awaiting call for enrollment in cardiac rehab - I told him to let us know if they do not contact him. 2. Transient AF - he is reporting intermittent fluttering sensation since discharge. He felt it today while in clinic but I did not appreciate any arrhythmias by pulse palpation. However, will plan 14-day event monitor to exclude recurrent AF. 3. 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 4. Hyperlipidemia - f/u lipids already planned per prior discharge. He is on 40mg  in the setting of prior fatty liver disease and elevated LFTs. 5. Fatigue - in the context of obesity and sinus bradycardia (particularly nocturnal), will arrange for sleep study. If unrevealing, we might try him off BB although I think we need to exclude recurrent AF before doing  so. 6. Sinus bradycardia - chronic.  7. Tobacco abuse - he is no longer smoking! :) He is using nicorette. Encouraged continued abstinence.   Dispo: F/u 2-3 weeks with myself or Dr. Burt Knack to reassess fatigue, monitor palpitations.  Signed, Melina Copa, PA-C  11/12/2014 9:42 AM

## 2014-11-13 ENCOUNTER — Telehealth: Payer: Self-pay | Admitting: Cardiology

## 2014-11-13 NOTE — Telephone Encounter (Signed)
11-13-14  Spoke with patient regarding monitor appointment schedule for Monday that need to be cancel.  Patient is self pay and will have to filled out application for [employer] with [employer] for monitor.   Patient will pick up application on Monday.   Once filled and and return, I will fax to [facility].

## 2014-11-17 ENCOUNTER — Telehealth: Payer: Self-pay | Admitting: *Deleted

## 2014-11-17 ENCOUNTER — Other Ambulatory Visit (HOSPITAL_BASED_OUTPATIENT_CLINIC_OR_DEPARTMENT_OTHER): Payer: Medicaid Other

## 2014-11-17 ENCOUNTER — Telehealth: Payer: Self-pay | Admitting: Physician Assistant

## 2014-11-17 DIAGNOSIS — D696 Thrombocytopenia, unspecified: Secondary | ICD-10-CM | POA: Diagnosis not present

## 2014-11-17 LAB — CBC WITH DIFFERENTIAL/PLATELET
BASO%: 0.4 % (ref 0.0–2.0)
Basophils Absolute: 0 10*3/uL (ref 0.0–0.1)
EOS%: 0.9 % (ref 0.0–7.0)
Eosinophils Absolute: 0 10*3/uL (ref 0.0–0.5)
HCT: 38.1 % — ABNORMAL LOW (ref 38.4–49.9)
HGB: 12.5 g/dL — ABNORMAL LOW (ref 13.0–17.1)
LYMPH%: 12.7 % — AB (ref 14.0–49.0)
MCH: 31.7 pg (ref 27.2–33.4)
MCHC: 32.7 g/dL (ref 32.0–36.0)
MCV: 96.8 fL (ref 79.3–98.0)
MONO#: 0.3 10*3/uL (ref 0.1–0.9)
MONO%: 5 % (ref 0.0–14.0)
NEUT#: 4.5 10*3/uL (ref 1.5–6.5)
NEUT%: 81 % — ABNORMAL HIGH (ref 39.0–75.0)
PLATELETS: 48 10*3/uL — AB (ref 140–400)
RBC: 3.94 10*6/uL — ABNORMAL LOW (ref 4.20–5.82)
RDW: 15.3 % — AB (ref 11.0–14.6)
WBC: 5.5 10*3/uL (ref 4.0–10.3)
lymph#: 0.7 10*3/uL — ABNORMAL LOW (ref 0.9–3.3)

## 2014-11-17 NOTE — Telephone Encounter (Signed)
Left message to call office

## 2014-11-17 NOTE — Telephone Encounter (Signed)
-----   Message from Heath Lark, MD sent at 11/17/2014 12:23 PM EST ----- Regarding: cbc Can you check how he is doing? Platelets low at 48, no further prednisone taper until I see him back. thanks ----- Message -----    From: [facility] Interface    Sent: 11/17/2014  11:57 AM      To: Heath Lark, MD

## 2014-11-17 NOTE — Telephone Encounter (Signed)
11-17-14 Received from patient info for Hardship with [employer].   Patient was enrolled and info faxed to [facility].

## 2014-11-17 NOTE — Telephone Encounter (Signed)
Pt states he is doing well, no bleeding. Understands message below

## 2014-11-26 ENCOUNTER — Encounter (INDEPENDENT_AMBULATORY_CARE_PROVIDER_SITE_OTHER): Payer: Medicaid Other

## 2014-11-26 DIAGNOSIS — I4891 Unspecified atrial fibrillation: Secondary | ICD-10-CM

## 2014-12-08 ENCOUNTER — Ambulatory Visit (HOSPITAL_BASED_OUTPATIENT_CLINIC_OR_DEPARTMENT_OTHER): Payer: Medicaid Other | Admitting: Hematology and Oncology

## 2014-12-08 ENCOUNTER — Other Ambulatory Visit (HOSPITAL_BASED_OUTPATIENT_CLINIC_OR_DEPARTMENT_OTHER): Payer: Medicaid Other

## 2014-12-08 ENCOUNTER — Telehealth: Payer: Self-pay | Admitting: Hematology and Oncology

## 2014-12-08 ENCOUNTER — Encounter: Payer: Self-pay | Admitting: Hematology and Oncology

## 2014-12-08 VITALS — BP 156/77 | HR 78 | Temp 98.0°F | Resp 18 | Ht 76.0 in | Wt 309.1 lb

## 2014-12-08 DIAGNOSIS — L409 Psoriasis, unspecified: Secondary | ICD-10-CM

## 2014-12-08 DIAGNOSIS — D696 Thrombocytopenia, unspecified: Secondary | ICD-10-CM

## 2014-12-08 DIAGNOSIS — I214 Non-ST elevation (NSTEMI) myocardial infarction: Secondary | ICD-10-CM

## 2014-12-08 DIAGNOSIS — R040 Epistaxis: Secondary | ICD-10-CM | POA: Diagnosis not present

## 2014-12-08 DIAGNOSIS — R5381 Other malaise: Secondary | ICD-10-CM

## 2014-12-08 DIAGNOSIS — Z720 Tobacco use: Secondary | ICD-10-CM

## 2014-12-08 DIAGNOSIS — R600 Localized edema: Secondary | ICD-10-CM | POA: Diagnosis not present

## 2014-12-08 LAB — CBC WITH DIFFERENTIAL/PLATELET
BASO%: 0.2 % (ref 0.0–2.0)
Basophils Absolute: 0 10*3/uL (ref 0.0–0.1)
EOS ABS: 0.1 10*3/uL (ref 0.0–0.5)
EOS%: 1.1 % (ref 0.0–7.0)
HCT: 36.9 % — ABNORMAL LOW (ref 38.4–49.9)
HEMOGLOBIN: 12.4 g/dL — AB (ref 13.0–17.1)
LYMPH#: 0.7 10*3/uL — AB (ref 0.9–3.3)
LYMPH%: 15.6 % (ref 14.0–49.0)
MCH: 32 pg (ref 27.2–33.4)
MCHC: 33.6 g/dL (ref 32.0–36.0)
MCV: 95.1 fL (ref 79.3–98.0)
MONO#: 0.3 10*3/uL (ref 0.1–0.9)
MONO%: 7.5 % (ref 0.0–14.0)
NEUT#: 3.4 10*3/uL (ref 1.5–6.5)
NEUT%: 75.6 % — AB (ref 39.0–75.0)
PLATELETS: 56 10*3/uL — AB (ref 140–400)
RBC: 3.88 10*6/uL — AB (ref 4.20–5.82)
RDW: 14.6 % (ref 11.0–14.6)
WBC: 4.6 10*3/uL (ref 4.0–10.3)

## 2014-12-08 NOTE — Assessment & Plan Note (Signed)
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 The goal would be just to keep his platelet count above 50,000. I recommend him to taper prednisone to 20 mg starting tomorrow. I have made him an appointment to come back in 1 month. I will see him back a month from now to decide further management for his thrombocytopenia. He will continue do antiplatelet agents as long as his platelet count remained above 30,000. I explained to the patient the rationale of continuing antiplatelet agents with chronic thrombocytopenia.

## 2014-12-08 NOTE — Telephone Encounter (Signed)
s.w pt wife and advised on Feb appt....ok and aware °

## 2014-12-08 NOTE — Assessment & Plan Note (Signed)
The patient tries to smoke recently but felt that cigarettes did not taste well. I counseled him about the importance of smoking cessation

## 2014-12-08 NOTE — Assessment & Plan Note (Signed)
He has significant symptoms of intermittent chest discomfort. He is wearing a heart monitor. I do not think it is safe for the patient to return to work. I gave him a letter to bring to his employer to give him another 30 days time.

## 2014-12-08 NOTE — Assessment & Plan Note (Signed)
He has significant physical deconditioning from recent MI. I gave him a letter to certify him disabled for the next 30 days. He is on the waiting list for physical and cardiac rehabilitation next week.

## 2014-12-08 NOTE — Assessment & Plan Note (Signed)
This is related to dual antiplatelet agents. Now that he has finished his prescription Plavix, hopefully the nosebleed frequency will reduce in the future. I recommend he continue his aspirin therapy as unless his platelet count is above 30,000

## 2014-12-08 NOTE — Progress Notes (Signed)
[facility] OFFICE PROGRESS NOTE  [place] [name], MD SUMMARY OF HEMATOLOGIC HISTORY: Vincent Ochoa 59 y.o. male with a history of cardiac disease was admitted via Emergency Department for recurrent chest pain.He was initially seen in consultation on 12/8 for thrombocytopenia, felt to be likely autoimmune (related to psoriasis and liver disease), responding well to 2 doses of IVIG in addition to prednisone and transfusions. Patient had been discharged on 12/11 home with baby aspirin. He was subsequently readmitted to the hospital again on 11/03/2014 with recurrent chest pain and subsequently underwent cardiac catheterization and placement of bare metal stent on 11/04/2014. INTERVAL HISTORY: Vincent Ochoa 59 y.o. male returns for further follow-up. He has multiple complaints. He complained of intermittent chest discomfort and shortness of breath on minimal exertion. He had recurrent epistaxis from the right nasal passage, 3 times a week. He completed his course of Plavix recently. The patient denies any recent signs or symptoms of bleeding such as spontaneous hematuria or hematochezia. He denies psoriasis. He complained of leg cramps/discomfort.   I have reviewed the past medical history, past surgical history, social history and family history with the patient and they are unchanged from previous note.  ALLERGIES:  has No Known Allergies.  MEDICATIONS:  Current Outpatient Prescriptions  Medication Sig Dispense Refill  . aspirin 81 MG tablet Take 1 tablet (81 mg total) by mouth daily.    Marland Kitchen atorvastatin (LIPITOR) 40 MG tablet Take 1 tablet (40 mg total) by mouth every evening. 30 tablet 1  . metoprolol succinate (TOPROL-XL) 25 MG 24 hr tablet Take 0.5 tablets (12.5 mg total) by mouth daily. 30 tablet 1  . Multiple Vitamin (MULTIVITAMIN WITH MINERALS) TABS tablet Take 1 tablet by mouth daily.    . nitroGLYCERIN (NITROSTAT) 0.4 MG SL tablet Place 1 tablet (0.4 mg  total) under the tongue every 5 (five) minutes as needed for chest pain (up to 3 doses). 25 tablet 3  . pantoprazole (PROTONIX) 40 MG tablet Take 1 tablet (40 mg total) by mouth daily. 30 tablet 1  . predniSONE (DELTASONE) 20 MG tablet Take 4 tablets (80 mg total) by mouth daily with breakfast. (Patient taking differently: Take 40 mg by mouth daily with breakfast. ) 56 tablet 0   No current facility-administered medications for this visit.     REVIEW OF SYSTEMS:   Constitutional: Denies fevers, chills or night sweats Eyes: Denies blurriness of vision Ears, nose, mouth, throat, and face: Denies mucositis or sore throat Respiratory: Denies cough, dyspnea or wheezes  Gastrointestinal:  Denies nausea, heartburn or change in bowel habits Skin: Denies abnormal skin rashes Lymphatics: Denies new lymphadenopathy or easy bruising Neurological:Denies numbness, tingling or new weaknesses Behavioral/Psych: Mood is stable, no new changes  All other systems were reviewed with the patient and are negative.  PHYSICAL EXAMINATION: ECOG PERFORMANCE STATUS: 1 - Symptomatic but completely ambulatory  Filed Vitals:   12/08/14 1334  BP: 156/77  Pulse: 78  Temp: 98 F (36.7 C)  Resp: 18   Filed Weights   12/08/14 1334  Weight: 309 lb 1.6 oz (140.207 kg)    GENERAL:alert, no distress and comfortable. He is morbidly obese SKIN: skin color, texture, turgor are normal, no rashes or significant lesions EYES: normal, Conjunctiva are pink and non-injected, sclera clear HEART: mild right lower extremity edema Musculoskeletal:no cyanosis of digits and no clubbing  NEURO: alert & oriented x 3 with fluent speech, no focal motor/sensory deficits  LABORATORY DATA:  I have reviewed  the data as listed Results for orders placed or performed in visit on 12/08/14 (from the past 48 hour(s))  CBC with Differential     Status: Abnormal   Collection Time: 12/08/14  1:23 PM  Result Value Ref Range   WBC 4.6 4.0 -  10.3 10e3/uL   NEUT# 3.4 1.5 - 6.5 10e3/uL   HGB 12.4 (L) 13.0 - 17.1 g/dL   HCT 36.9 (L) 38.4 - 49.9 %   Platelets 56 (L) 140 - 400 10e3/uL   MCV 95.1 79.3 - 98.0 fL   MCH 32.0 27.2 - 33.4 pg   MCHC 33.6 32.0 - 36.0 g/dL   RBC 3.88 (L) 4.20 - 5.82 10e6/uL   RDW 14.6 11.0 - 14.6 %   lymph# 0.7 (L) 0.9 - 3.3 10e3/uL   MONO# 0.3 0.1 - 0.9 10e3/uL   Eosinophils Absolute 0.1 0.0 - 0.5 10e3/uL   Basophils Absolute 0.0 0.0 - 0.1 10e3/uL   NEUT% 75.6 (H) 39.0 - 75.0 %   LYMPH% 15.6 14.0 - 49.0 %   MONO% 7.5 0.0 - 14.0 %   EOS% 1.1 0.0 - 7.0 %   BASO% 0.2 0.0 - 2.0 %    Lab Results  Component Value Date   WBC 4.6 12/08/2014   HGB 12.4* 12/08/2014   HCT 36.9* 12/08/2014   MCV 95.1 12/08/2014   PLT 56* 12/08/2014   ASSESSMENT & PLAN:  Thrombocytopenia He has immune mediated thrombocytopenia which is somewhat refractory to high-dose steroids. The goal would be just to keep his platelet count above 50,000. I recommend him to taper prednisone to 20 mg starting tomorrow. I have made him an appointment to come back in 1 month. I will see him back a month from now to decide further management for his thrombocytopenia. He will continue do antiplatelet agents as long as his platelet count remained above 30,000. I explained to the patient the rationale of continuing antiplatelet agents with chronic thrombocytopenia.   NSTEMI (non-ST elevated myocardial infarction) He has significant symptoms of intermittent chest discomfort. He is wearing a heart monitor. I do not think it is safe for the patient to return to work. I gave him a letter to bring to his employer to give him another 30 days time.   Psoriasis The psoriasis resolved with prednisone therapy.   Recurrent epistaxis This is related to dual antiplatelet agents. Now that he has finished his prescription Plavix, hopefully the nosebleed frequency will reduce in the future. I recommend he continue his aspirin therapy as unless his  platelet count is above 30,000   Leg edema, right He has mild leg pain and swelling. I suspect some of his symptoms could be related to poor circulation/peripheral vascular disease. I recommend he touch base with his cardiologist in the next visit for possible evaluation of peripheral vascular disease   Tobacco abuse The patient tries to smoke recently but felt that cigarettes did not taste well. I counseled him about the importance of smoking cessation    Physical deconditioning He has significant physical deconditioning from recent MI. I gave him a letter to certify him disabled for the next 30 days. He is on the waiting list for physical and cardiac rehabilitation next week.    All questions were answered. The patient knows to call the clinic with any problems, questions or concerns. No barriers to learning was detected.  I spent 25 minutes counseling the patient face to face. The total time spent in the appointment was 30 minutes  and more than 50% was on counseling.     [facility], Ilse Billman, MD 1/18/20161:59 PM

## 2014-12-08 NOTE — Assessment & Plan Note (Signed)
He has mild leg pain and swelling. I suspect some of his symptoms could be related to poor circulation/peripheral vascular disease. I recommend he touch base with his cardiologist in the next visit for possible evaluation of peripheral vascular disease

## 2014-12-08 NOTE — Assessment & Plan Note (Signed)
The psoriasis resolved with prednisone therapy.

## 2014-12-10 ENCOUNTER — Ambulatory Visit (HOSPITAL_BASED_OUTPATIENT_CLINIC_OR_DEPARTMENT_OTHER): Payer: Medicaid Other | Attending: Physician Assistant | Admitting: *Deleted

## 2014-12-10 VITALS — Ht 75.0 in | Wt 309.0 lb

## 2014-12-10 DIAGNOSIS — R5382 Chronic fatigue, unspecified: Secondary | ICD-10-CM | POA: Diagnosis not present

## 2014-12-10 DIAGNOSIS — G4719 Other hypersomnia: Secondary | ICD-10-CM | POA: Diagnosis present

## 2014-12-10 DIAGNOSIS — G4733 Obstructive sleep apnea (adult) (pediatric): Secondary | ICD-10-CM | POA: Diagnosis not present

## 2014-12-16 ENCOUNTER — Ambulatory Visit (INDEPENDENT_AMBULATORY_CARE_PROVIDER_SITE_OTHER): Payer: Medicaid Other | Admitting: Cardiovascular Disease

## 2014-12-16 ENCOUNTER — Encounter: Payer: Self-pay | Admitting: *Deleted

## 2014-12-16 ENCOUNTER — Encounter: Payer: Self-pay | Admitting: Cardiovascular Disease

## 2014-12-16 VITALS — BP 120/68 | HR 66 | Ht 75.0 in | Wt 300.0 lb

## 2014-12-16 DIAGNOSIS — I2510 Atherosclerotic heart disease of native coronary artery without angina pectoris: Secondary | ICD-10-CM | POA: Diagnosis not present

## 2014-12-16 DIAGNOSIS — I4891 Unspecified atrial fibrillation: Secondary | ICD-10-CM | POA: Diagnosis not present

## 2014-12-16 MED ORDER — FUROSEMIDE 20 MG PO TABS: 20.0000 mg | ORAL_TABLET | Freq: Every day | ORAL | Status: DC

## 2014-12-16 MED ORDER — ATORVASTATIN CALCIUM 10 MG PO TABS: 10.0000 mg | ORAL_TABLET | Freq: Every evening | ORAL | Status: DC

## 2014-12-16 MED ORDER — METOPROLOL SUCCINATE ER 25 MG PO TB24: 12.5000 mg | ORAL_TABLET | Freq: Every day | ORAL | Status: DC

## 2014-12-16 NOTE — Patient Instructions (Signed)
Your physician has recommended you make the following change in your medication:  1. START Furosemide 20mg  take one by mouth daily 2. DECREASE Atorvastatin to 10mg  take one by mouth every evening  Your physician recommends that you return for a FASTING BMP, LIVER and LIPID in 6 WEEKS--nothing to eat or drink after midnight, lab opens at 7:30 AM  Your physician has requested that you have an echocardiogram in 6 WEEKS. Echocardiography is a painless test that uses sound waves to create images of your heart. It provides your doctor with information about the size and shape of your heart and how well your heart's chambers and valves are working. This procedure takes approximately one hour. There are no restrictions for this procedure.  Your physician recommends that you schedule a follow-up appointment in: [place] with Dr Burt Knack

## 2014-12-16 NOTE — Progress Notes (Signed)
Cardiology Office Note   Date:  12/18/2014   ID:  Vincent Ochoa, DOB Sep 20, 1956, MRN [id number]  PCP:  Jeb Levering, Philbert Riser, MD  Cardiologist:  Sherren Mocha, MD    Chief Complaint  Patient presents with  . Follow-up     History of Present Illness: Vincent Ochoa is a 59 y.o. male who presents for follow-up CAD and recent NSTEMI. He has a complex medical history with autoimmune mediated thrombocytopenia, psoriasis, transient atrial fibrillation, and obesity. He was hospitalized in December 2015 with unstable angina and was discharged home on medical therapy while he was undergoing treatment to bring platelet count up so he could undergo cath and PCI. He returned with ACS and ruled in for NSTEMI. He underwent PCI of the left circumflex with a bare metal stent and has now completed 30 days of plavix. He had epistaxis while on plavix.LV systolic function has been normal.  He has multiple complaints today. He doesn't feel well and complains primarily of fatigue and muscle aches/weakness. Has had some chest pain but nothing like what he experienced at the time of his NSTEMI. Other complaints include shortness of breath. See ROS for multitude of other complaints.  Past Medical History  Diagnosis Date  . GERD (gastroesophageal reflux disease)   . Psoriasis   . Frequent headaches   . Facial cellulitis 2014    Periorbital  . Coronary artery disease     a. Evaluated by Dr. Stanford Breed 2012 - nonobstructive 2007-2008 by cath. b. Abnormal stress test 10/2014 but cath deferred temporarily due to thrombocytopenia. c. 10/2014: readm with AF-RVR/NSTEMI - s/p BMS to mLCx 11/04/14 (mod dz in RCA).   . Back pain, lumbosacral 2014  . Thrombocytopenia     a. Onset unclear, but noted 2012. ? 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 s/p prednisone, IVIG.  Marland Kitchen Hyperlipidemia   . Obesity   . Elevated LFTs   . Fatty liver US - 2012  . Atrial fibrillation     a. Episode  10/2014 at time of NSTEMI, spont converted to NSR. Observation for further episodes (not on anticoag at present time due to [facility] = 1, thrombocytopenia).  . Sinus bradycardia     Past Surgical History  Procedure Laterality Date  . Tonsillectomy  1960's  . Cholecystectomy  2007  . Knee arthroplasty  1970's  . Facial cosmetic surgery  2014  . Cardiac catheterization    . Left heart catheterization with coronary angiogram N/A 11/04/2014    Procedure: LEFT HEART CATHETERIZATION WITH CORONARY ANGIOGRAM;  Surgeon: Jettie Booze, MD;  Location: [facility] CATH LAB;  Service: Cardiovascular;  Laterality: N/A;    Current Outpatient Prescriptions  Medication Sig Dispense Refill  . aspirin 81 MG tablet Take 1 tablet (81 mg total) by mouth daily.    Marland Kitchen atorvastatin (LIPITOR) 10 MG tablet Take 1 tablet (10 mg total) by mouth every evening. 90 tablet 3  . metoprolol succinate (TOPROL-XL) 25 MG 24 hr tablet Take 0.5 tablets (12.5 mg total) by mouth daily. 30 tablet 6  . Multiple Vitamin (MULTIVITAMIN WITH MINERALS) TABS tablet Take 1 tablet by mouth daily.    . nitroGLYCERIN (NITROSTAT) 0.4 MG SL tablet Place 1 tablet (0.4 mg total) under the tongue every 5 (five) minutes as needed for chest pain (up to 3 doses). 25 tablet 3  . pantoprazole (PROTONIX) 40 MG tablet Take 1 tablet (40 mg total) by mouth daily. 30 tablet 1  . predniSONE (  DELTASONE) 20 MG tablet Take 20 mg by mouth daily with breakfast.    . furosemide (LASIX) 20 MG tablet Take 1 tablet (20 mg total) by mouth daily. 90 tablet 3   No current facility-administered medications for this visit.    Allergies:   Review of patient's allergies indicates no known allergies.   Social History:  The patient  reports that he quit smoking about 7 weeks ago. His smoking use included Cigarettes. He has a 64.5 pack-year smoking history. He has never used smokeless tobacco. He reports that he drinks alcohol. He reports that he does not use illicit drugs.     Family History:  The patient's family history includes Arthritis in his maternal grandmother; Cancer - Ovarian in his sister; Cirrhosis in his mother; Diabetes in his sister; Heart attack in his maternal aunt and mother; Heart disease in his sister and another family member; Hypertension in his father and mother.    ROS:  Please see the history of present illness.  Otherwise, review of systems is positive for chest pain, leg swelling, shortness of breath, blood in stool, dizziness, fatigue, leg cramps, snoring, wheezing, anxiety, and joint swelling.  All other systems are reviewed and negative.    PHYSICAL EXAM: VS:  BP 120/68 mmHg  Pulse 66  Ht 6\' 3"  (1.905 m)  Wt 300 lb (136.079 kg)  BMI 37.50 kg/m2  SpO2 94% , BMI Body mass index is 37.5 kg/(m^2). GEN: Well nourished, well developed, obese male in no acute distress HEENT: normal Neck: no JVD, carotid bruits, or masses Cardiac: RRR; no murmurs, rubs, or gallops,1+ pretibial edema bilaterally Respiratory:  clear to auscultation bilaterally, normal work of breathing GI: soft, nontender, nondistended, + BS MS: no deformity or atrophy Skin: warm and dry, no rash Neuro:  Strength and sensation are intact Psych: euthymic mood, full affect  EKG:  EKG is not ordered today.  Recent Labs: 10/28/2014: Magnesium 1.7; TSH 2.210 11/03/2014: ALT 64*; Pro B Natriuretic peptide (BNP) 564.0* 11/05/2014: BUN 17; Creatinine 0.86; Potassium 5.0; Sodium 142 12/08/2014: Hemoglobin 12.4*; Platelets 56*   Lipid Panel     Component Value Date/Time   CHOL 174 10/29/2014 0455   TRIG 174* 10/29/2014 0455   HDL 24* 10/29/2014 0455   CHOLHDL 7.3 10/29/2014 0455   VLDL 35 10/29/2014 0455   LDLCALC 115* 10/29/2014 0455      Wt Readings from Last 3 Encounters:  12/16/14 300 lb (136.079 kg)  12/10/14 309 lb (140.161 kg)  12/08/14 309 lb 1.6 oz (140.207 kg)     Other studies Reviewed: 2D Echo 10/29/2014: Study Conclusions  - Left ventricle: The  cavity size was mildly dilated. Systolic function was normal. The estimated ejection fraction was in the range of 60% to 65%. Wall motion was normal; there were no regional wall motion abnormalities. Features are consistent with a pseudonormal left ventricular filling pattern, with concomitant abnormal relaxation and increased filling pressure (grade 2 diastolic dysfunction). Doppler parameters are consistent with elevated ventricular end-diastolic filling pressure. - Aortic valve: Trileaflet; normal thickness leaflets. There was no regurgitation. - Aortic root: The aortic root was normal in size. - Mitral valve: Structurally normal valve. - Left atrium: The atrium was moderately dilated. - Right ventricle: Systolic function was normal. - Right atrium: The atrium was mildly dilated. - Tricuspid valve: There was mild regurgitation. - Pulmonic valve: There was no regurgitation. - Pulmonary arteries: Systolic pressure was within the normal range. - Inferior vena cava: The vessel was normal in size. -  Pericardium, extracardiac: There was no pericardial effusion.  Cardiac Cath 11/04/2014: IMPRESSIONS:  1. Patent left main coronary artery. 2. Patent left anterior descending artery and its branches. 3. 99% stenosis in the mid left circumflex artery which was the culprit for his non-STEMI. This was successfully treated with a 2.25 x 12 vision bare-metal stent, postdilated to 2.8 mm in diameter. Bare-metal stent was used due to his thrombocytopenia. 4. Moderate disease in the mid right coronary artery. 5. Left ventricular systolic function not assessed. LVEDP 32 mmHg.  RECOMMENDATION: Continue dual antiplatelet therapy for at least a month. Continue with workup for thrombocytopenia. Atrial fibrillation management per Dr. Angelena Form.  ASSESSMENT AND PLAN: 1.  CAD, recent NSTEMI: appears stable. Some residual chest pain noted but not like his previous angina. Continue ASA,  stay off plavix in setting residual thrombocytopenia (most recent plt count 56k).   2. Transient AF - pt wearing event monitor. Will review strips but no AF to date. He would not be a good candidate for oral anticoagulation.  3. Hyperlipidemia: suspect some statin-related myalgias. Recommended reduce atorvastatin to 10 mg in setting of significant symptoms.  4. Edema: possible diastolic heart failure. No echo since time of NSTEMI, but suspect normal systolic function. Will check an echo in 6 weeks prior to his return visit. Add lasix 20 mg daily.  5. Dispo; FU 8 weeks. 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 Considering his hematologic and cardiac problems, I do not think he will be able to return to work. He is not trained/educated for other work. I would support consideration of disability.   Current medicines are reviewed with the patient today.  The patient does not have concerns regarding medicines.  The following changes have been made:    Decrease atorvastatin to 10 mg daily  Add lasix 20 mg daily  Labs/ tests ordered today include:   Orders Placed This Encounter  Procedures  . Basic Metabolic Panel (BMET)  . Hepatic function panel  . Lipid panel  . 2D Echocardiogram without contrast    Disposition:   FU with me in 8 weeks.  Signed, Sherren Mocha, MD  12/18/2014 10:08 PM    [facility] [place], [place], [place]  [address] Phone: [phone]; Fax: [phone]

## 2014-12-16 NOTE — Progress Notes (Signed)
Patient ID: Vincent Ochoa, male   DOB: August 16, 1956, 59 y.o.   MRN: [id number] On 11/18/15 patient was enrolled with [employer] to mail a 30 day cardiac event monitor ( pending approval of [place] application ), by Abram Sander.  On 11/26/2014 the patient sent a baseline recording from the [place] monitor shipped to his address.

## 2014-12-19 ENCOUNTER — Telehealth: Payer: Self-pay | Admitting: Cardiovascular Disease

## 2014-12-19 NOTE — Telephone Encounter (Signed)
New message     Pt went to pick up prescriptions (atorvastatin, furosemide and metoprolol) and thought we had a discount card for these medications.  Do you know what the pt is talking about?

## 2014-12-19 NOTE — Telephone Encounter (Signed)
Pts pharmacy calling stating that a nurse from our office informed the pt and mother that they would send a discount card or some kind of savings plan to the pts pharmacy to make meds atorvastatin, lasix, and metoprolol more affordable.  Pts pharmacy states the pts mother came and picked up the pts meds today totaling $31, but mother requesting a discount due to pt not having any insurance.  No phone note noted in the pts chart where this savings plan was discussed.  Informed the pharmacy of this and informed them that Dr Burt Knack and nurse are both out of the office today.  Informed the pharmacy that I will route this message to both of them for further review and recommendation, and someone will follow-up thereafter with the pt and pharmacy.  Pharmacy verbalized understanding and agrees with this plan.

## 2014-12-22 NOTE — Telephone Encounter (Signed)
I spoke with Vincent Ochoa at the [facility] and she said the pt was part of the [facility] program which supplies the pt with the first 30 days of medication at a discount. Per [place] the only other programs available are for patients with a heart failure diagnosis. The pt does not have CHF. She said that the pharmacy prices are compatible with prices at [facility] and she did not know of any cheaper ways to get medications.   I called the pt's home and left a message for him to call back.

## 2014-12-23 ENCOUNTER — Encounter: Payer: Self-pay | Admitting: *Deleted

## 2014-12-23 NOTE — Progress Notes (Signed)
Patient ID: Vincent Ochoa, male   DOB: October 20, 1956, 59 y.o.   MRN: [id number] [employer] 30 day cardiac event monitor baseline sent 11/26/14.  Expected end of service will be 12/25/14.

## 2014-12-27 ENCOUNTER — Telehealth: Payer: Self-pay | Admitting: Cardiology

## 2014-12-27 ENCOUNTER — Encounter: Payer: Self-pay | Admitting: Cardiology

## 2014-12-27 DIAGNOSIS — G4733 Obstructive sleep apnea (adult) (pediatric): Secondary | ICD-10-CM | POA: Insufficient documentation

## 2014-12-27 NOTE — Telephone Encounter (Signed)
Please let patient know that he has severe OSA and did well with CPAP titration.  Please let [facility] know that orders for CPAP are in EPIC.  Please set patient up to see me in 10 weeks

## 2014-12-27 NOTE — Sleep Study (Signed)
   NAME: Vincent Ochoa DATE OF BIRTH:  [birth year] MEDICAL RECORD NUMBER [id number]  LOCATION: [facility]  PHYSICIAN: TURNER,TRACI R  DATE OF STUDY: 12/10/2014  SLEEP STUDY TYPE: Split Nocturnal Polysomnogram with CPAP titration               REFERRING PHYSICIAN: Charlie Pitter, PA-C  INDICATION FOR STUDY: loud snoring, excessive daytime sleepiness  EPWORTH SLEEPINESS SCORE: 17 HEIGHT: 6\' 3"  (190.5 cm)  WEIGHT: (!) 309 lb (140.161 kg)    Body mass index is 38.62 kg/(m^2).  NECK SIZE: 18 in.  MEDICATIONS: Reviewed in the chart  SLEEP ARCHITECTURE: During the diagnostic portion of the study, the patient slept for a total of 141 minutes with no slow wave or REM sleep.  The sleep efficiency was normal at 88%.  During the CPAP titration the patient slept a total of 251 minutes with no slow wave sleep and 108 minutes of REM sleep.  The onset to REM latency was short at 26 minutes.  The sleep efficiency improved to 99%.    RESPIRATORY DATA: During the diagnostic portion of the study, the patient had a total of 11 apneas, all of which were obstructive, and 122 hypopneas.  All occurred during NREM sleep and most occurred in the nonsupine position.  The overall AHI was 56.4 events per hour consistent with severe obstructive sleep apnea/hypopnea syndrome.  There was mild to moderate snoring.  The patient was started on CPAP at 5cm H2O and titrated for respiratory events and snoring to a pressure of 19cm H2O.  The AHI was 0 at both 18 and 19cm H2O.  The patient was able to sleep for prolonged periods of time and maintain REM sleep without any further respiratory events at a pressure of 18cm H2O.  Snoring was eliminated with CPAP.    OXYGEN DATA: The lowest Oxygen desaturation during the diagnostic portion was 87% and during CPAP titration at 18cm H2O it was 90%.    CARDIAC DATA: The patient maintained NSR throughout the study.  MOVEMENT/PARASOMNIA: There were no periodic limb  movements or REM behavior sleep disorders noted during the study.  IMPRESSION/ RECOMMENDATION:   1.  Severe obstructive sleep apnea/hypopnea syndrome with an AHI of 56 events per hour.  Most events occurred in the supine position during NREM sleep. 2.  Reduced sleep efficiency with increased frequency of arousals due to respiratory events. 3.  Mild to moderate snoring was noted and was abolished with CPAP. 4.  Successful CPAP titration to 18cm H2O during REM sleep although the patient was not able to achieve the supine position during CPAP titration. 5.  The patient should be counseled on good sleep hygiene and weight loss. 6.  The patient should be counseled to avoid sleeping in the supine position. 7.  Recommend ResMed CPAP with heated humidity, C flex 3 and large [employer] Simplus full face mask at 18cm H2O  Signed:  [place], American Board of Sleep Medicine  ELECTRONICALLY SIGNED ON:  12/27/2014, 6:55 AM [place] PH: [phone]   FX: [phone] [place]

## 2014-12-27 NOTE — Addendum Note (Signed)
Addended by: Fransico Him R on: 12/27/2014 07:10 AM   Modules accepted: Orders

## 2014-12-29 NOTE — Telephone Encounter (Signed)
Patient informed of results and verbal understanding expressed.  [place] notified. OV scheduled in 10 weeks.

## 2015-01-01 NOTE — Telephone Encounter (Signed)
A young man answered the home phone and took a message for the pt to call our office to discuss heart monitor results.   Monitor showed NSR, No atrial fibrillation.

## 2015-01-08 ENCOUNTER — Ambulatory Visit (HOSPITAL_BASED_OUTPATIENT_CLINIC_OR_DEPARTMENT_OTHER): Payer: Medicaid Other | Admitting: Hematology and Oncology

## 2015-01-08 ENCOUNTER — Telehealth: Payer: Self-pay | Admitting: Hematology and Oncology

## 2015-01-08 ENCOUNTER — Other Ambulatory Visit (HOSPITAL_BASED_OUTPATIENT_CLINIC_OR_DEPARTMENT_OTHER): Payer: Medicaid Other

## 2015-01-08 ENCOUNTER — Encounter: Payer: Self-pay | Admitting: Hematology and Oncology

## 2015-01-08 VITALS — BP 153/95 | HR 88 | Temp 98.1°F | Resp 20 | Ht 75.0 in | Wt 303.9 lb

## 2015-01-08 DIAGNOSIS — F329 Major depressive disorder, single episode, unspecified: Secondary | ICD-10-CM | POA: Diagnosis not present

## 2015-01-08 DIAGNOSIS — D696 Thrombocytopenia, unspecified: Secondary | ICD-10-CM | POA: Diagnosis not present

## 2015-01-08 DIAGNOSIS — Z720 Tobacco use: Secondary | ICD-10-CM | POA: Diagnosis not present

## 2015-01-08 DIAGNOSIS — I214 Non-ST elevation (NSTEMI) myocardial infarction: Secondary | ICD-10-CM | POA: Diagnosis not present

## 2015-01-08 DIAGNOSIS — L409 Psoriasis, unspecified: Secondary | ICD-10-CM

## 2015-01-08 DIAGNOSIS — F32A Depression, unspecified: Secondary | ICD-10-CM

## 2015-01-08 LAB — CBC WITH DIFFERENTIAL/PLATELET
BASO%: 0.6 % (ref 0.0–2.0)
BASOS ABS: 0 10*3/uL (ref 0.0–0.1)
EOS%: 2.5 % (ref 0.0–7.0)
Eosinophils Absolute: 0.1 10*3/uL (ref 0.0–0.5)
HCT: 37.9 % — ABNORMAL LOW (ref 38.4–49.9)
HGB: 12.1 g/dL — ABNORMAL LOW (ref 13.0–17.1)
LYMPH%: 17.4 % (ref 14.0–49.0)
MCH: 29.5 pg (ref 27.2–33.4)
MCHC: 32 g/dL (ref 32.0–36.0)
MCV: 92.4 fL (ref 79.3–98.0)
MONO#: 0.6 10*3/uL (ref 0.1–0.9)
MONO%: 12 % (ref 0.0–14.0)
NEUT%: 67.5 % (ref 39.0–75.0)
NEUTROS ABS: 3.2 10*3/uL (ref 1.5–6.5)
Platelets: 60 10*3/uL — ABNORMAL LOW (ref 140–400)
RBC: 4.1 10*6/uL — AB (ref 4.20–5.82)
RDW: 14.2 % (ref 11.0–14.6)
WBC: 4.7 10*3/uL (ref 4.0–10.3)
lymph#: 0.8 10*3/uL — ABNORMAL LOW (ref 0.9–3.3)

## 2015-01-08 NOTE — Telephone Encounter (Signed)
gv and printed appt sched anda vs for pt for March.... °

## 2015-01-08 NOTE — Progress Notes (Signed)
[facility] OFFICE PROGRESS NOTE  [place] [name], MD SUMMARY OF HEMATOLOGIC HISTORY:  Vincent Ochoa 59 y.o. male with a history of cardiac disease was admitted via Emergency Department for recurrent chest pain.He was initially seen in consultation on 12/8 for thrombocytopenia, felt to be likely autoimmune (related to psoriasis and liver disease), responding well to 2 doses of IVIG in addition to prednisone and transfusions. Patient had been discharged on 12/11 home with baby aspirin. He was subsequently readmitted to the hospital again on 11/03/2014 with recurrent chest pain and subsequently underwent cardiac catheterization and placement of bare metal stent on 11/04/2014. On 12/31/2014, he discontinued prednisone INTERVAL HISTORY: Vincent Ochoa 59 y.o. male returns for further follow-up. He has daily ongoing chest pain and has been taking nitroglycerine. He does not have chest pain now. He denies recent syncopal episode. The patient denies any recent signs or symptoms of bleeding such as spontaneous epistaxis, hematuria or hematochezia. He denies recurrence of psoriasis. He is depressed due to financial and legal problems. He started smoking again.  I have reviewed the past medical history, past surgical history, social history and family history with the patient and they are unchanged from previous note.  ALLERGIES:  has No Known Allergies.  MEDICATIONS:  Current Outpatient Prescriptions  Medication Sig Dispense Refill  . aspirin 81 MG tablet Take 1 tablet (81 mg total) by mouth daily.    Marland Kitchen atorvastatin (LIPITOR) 10 MG tablet Take 1 tablet (10 mg total) by mouth every evening. 90 tablet 3  . furosemide (LASIX) 20 MG tablet Take 1 tablet (20 mg total) by mouth daily. 90 tablet 3  . metoprolol succinate (TOPROL-XL) 25 MG 24 hr tablet Take 0.5 tablets (12.5 mg total) by mouth daily. 30 tablet 6  . Multiple Vitamin (MULTIVITAMIN WITH MINERALS) TABS tablet Take 1  tablet by mouth daily.    . nitroGLYCERIN (NITROSTAT) 0.4 MG SL tablet Place 1 tablet (0.4 mg total) under the tongue every 5 (five) minutes as needed for chest pain (up to 3 doses). 25 tablet 3  . pantoprazole (PROTONIX) 40 MG tablet Take 1 tablet (40 mg total) by mouth daily. 30 tablet 1   No current facility-administered medications for this visit.     REVIEW OF SYSTEMS:   Constitutional: Denies fevers, chills or night sweats Eyes: Denies blurriness of vision Ears, nose, mouth, throat, and face: Denies mucositis or sore throat Respiratory: Denies cough, dyspnea or wheezes Gastrointestinal:  Denies nausea, heartburn or change in bowel habits Skin: Denies abnormal skin rashes Lymphatics: Denies new lymphadenopathy or easy bruising Neurological:Denies numbness, tingling or new weaknesses Behavioral/Psych: Mood is stable, no new changes  All other systems were reviewed with the patient and are negative.  PHYSICAL EXAMINATION: ECOG PERFORMANCE STATUS: 1 - Symptomatic but completely ambulatory  Filed Vitals:   01/08/15 1420  BP: 153/95  Pulse: 88  Temp: 98.1 F (36.7 C)  Resp: 20   Filed Weights   01/08/15 1420  Weight: 303 lb 14.4 oz (137.848 kg)    GENERAL:alert, no distress and comfortable. He is morbidly obese SKIN: skin color, texture, turgor are normal, no rashes or significant lesions EYES: normal, Conjunctiva are pink and non-injected, sclera clear OROPHARYNX:no exudate, no erythema and lips, buccal mucosa, and tongue normal  NECK: supple, thyroid normal size, non-tender, without nodularity LYMPH:  no palpable lymphadenopathy in the cervical, axillary or inguinal LUNGS: clear to auscultation and percussion with normal breathing effort HEART: regular rate & rhythm and  no murmurs and no lower extremity edema ABDOMEN:abdomen soft, non-tender and normal bowel sounds Musculoskeletal:no cyanosis of digits and no clubbing  NEURO: alert & oriented x 3 with fluent speech, no  focal motor/sensory deficits  LABORATORY DATA:  I have reviewed the data as listed Results for orders placed or performed in visit on 01/08/15 (from the past 48 hour(s))  CBC with Differential     Status: Abnormal   Collection Time: 01/08/15  2:25 PM  Result Value Ref Range   WBC 4.7 4.0 - 10.3 10e3/uL   NEUT# 3.2 1.5 - 6.5 10e3/uL   HGB 12.1 (L) 13.0 - 17.1 g/dL   HCT 37.9 (L) 38.4 - 49.9 %   Platelets 60 (L) 140 - 400 10e3/uL   MCV 92.4 79.3 - 98.0 fL   MCH 29.5 27.2 - 33.4 pg   MCHC 32.0 32.0 - 36.0 g/dL   RBC 4.10 (L) 4.20 - 5.82 10e6/uL   RDW 14.2 11.0 - 14.6 %   lymph# 0.8 (L) 0.9 - 3.3 10e3/uL   MONO# 0.6 0.1 - 0.9 10e3/uL   Eosinophils Absolute 0.1 0.0 - 0.5 10e3/uL   Basophils Absolute 0.0 0.0 - 0.1 10e3/uL   NEUT% 67.5 39.0 - 75.0 %   LYMPH% 17.4 14.0 - 49.0 %   MONO% 12.0 0.0 - 14.0 %   EOS% 2.5 0.0 - 7.0 %   BASO% 0.6 0.0 - 2.0 %    Lab Results  Component Value Date   WBC 4.7 01/08/2015   HGB 12.1* 01/08/2015   HCT 37.9* 01/08/2015   MCV 92.4 01/08/2015   PLT 60* 01/08/2015    ASSESSMENT & PLAN:  Thrombocytopenia He has immune mediated thrombocytopenia which is somewhat refractory to high-dose steroids. The goal would be just to keep his platelet count above 50,000. He has completed prednisone taper less than a week ago.   I have made him an appointment to come back in 1 month.  He will continue do antiplatelet agents as long as his platelet count remained above 30,000. I explained to the patient the rationale of continuing antiplatelet agents with chronic thrombocytopenia.     NSTEMI (non-ST elevated myocardial infarction) He has significant symptoms of intermittent chest discomfort. He is wearing a heart monitor. I do not think it is safe for the patient to return to work. I gave him a letter to bring to his employer to give him another 30 days time. He has ongoing daily chest pain and has been taking nitroglycerin. I have sent a note to his  cardiologist requesting urgent follow-up.   Psoriasis The psoriasis resolved with prednisone therapy.   Tobacco abuse He started smoking again due to stress at home. Again, I counseled the patient to stop smoking.   Depression He is experiencing stressful situation with poor relationship with his wife, inability to go back to work, Museum/gallery curator and legal problems. His started smoking again. He denies suicidal ideation. Recommend he discuss his situation with his wife for further solution. He declined seeing a counselor today.    All questions were answered. The patient knows to call the clinic with any problems, questions or concerns. No barriers to learning was detected.  I spent 25 minutes counseling the patient face to face. The total time spent in the appointment was 30 minutes and more than 50% was on counseling.     [facility], Neomia Herbel, MD 2/18/20163:20 PM

## 2015-01-08 NOTE — Assessment & Plan Note (Signed)
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 The goal would be just to keep his platelet count above 50,000. He has completed prednisone taper less than a week ago.   I have made him an appointment to come back in 1 month.  He will continue do antiplatelet agents as long as his platelet count remained above 30,000. I explained to the patient the rationale of continuing antiplatelet agents with chronic thrombocytopenia.

## 2015-01-08 NOTE — Assessment & Plan Note (Signed)
He has significant symptoms of intermittent chest discomfort. He is wearing a heart monitor. I do not think it is safe for the patient to return to work. I gave him a letter to bring to his employer to give him another 30 days time. He has ongoing daily chest pain and has been taking nitroglycerin. I have sent a note to his cardiologist requesting urgent follow-up.

## 2015-01-08 NOTE — Assessment & Plan Note (Signed)
The psoriasis resolved with prednisone therapy.

## 2015-01-08 NOTE — Assessment & Plan Note (Signed)
He is experiencing stressful situation with poor relationship with his wife, inability to go back to work, Museum/gallery curator and legal problems. His started smoking again. He denies suicidal ideation. Recommend he discuss his situation with his wife for further solution. He declined seeing a counselor today.

## 2015-01-08 NOTE — Assessment & Plan Note (Signed)
He started smoking again due to stress at home. Again, I counseled the patient to stop smoking.

## 2015-01-09 ENCOUNTER — Ambulatory Visit (INDEPENDENT_AMBULATORY_CARE_PROVIDER_SITE_OTHER): Payer: Medicaid Other | Admitting: Physician Assistant

## 2015-01-09 ENCOUNTER — Encounter: Payer: Self-pay | Admitting: Physician Assistant

## 2015-01-09 VITALS — BP 125/60 | HR 84 | Ht 75.0 in | Wt 302.0 lb

## 2015-01-09 DIAGNOSIS — I480 Paroxysmal atrial fibrillation: Secondary | ICD-10-CM | POA: Diagnosis not present

## 2015-01-09 DIAGNOSIS — I2510 Atherosclerotic heart disease of native coronary artery without angina pectoris: Secondary | ICD-10-CM

## 2015-01-09 DIAGNOSIS — E785 Hyperlipidemia, unspecified: Secondary | ICD-10-CM

## 2015-01-09 DIAGNOSIS — R079 Chest pain, unspecified: Secondary | ICD-10-CM

## 2015-01-09 DIAGNOSIS — D696 Thrombocytopenia, unspecified: Secondary | ICD-10-CM

## 2015-01-09 DIAGNOSIS — Z720 Tobacco use: Secondary | ICD-10-CM

## 2015-01-09 DIAGNOSIS — R0602 Shortness of breath: Secondary | ICD-10-CM

## 2015-01-09 LAB — BASIC METABOLIC PANEL
BUN: 9 mg/dL (ref 6–23)
CO2: 32 meq/L (ref 19–32)
CREATININE: 0.78 mg/dL (ref 0.40–1.50)
Calcium: 9 mg/dL (ref 8.4–10.5)
Chloride: 106 mEq/L (ref 96–112)
GFR: 108.33 mL/min (ref >60.00)
Glucose, Bld: 77 mg/dL (ref 70–99)
Potassium: 3.9 mEq/L (ref 3.5–5.1)
Sodium: 140 mEq/L (ref 135–145)

## 2015-01-09 LAB — BRAIN NATRIURETIC PEPTIDE: Pro B Natriuretic peptide (BNP): 43 pg/mL (ref 0.0–100.0)

## 2015-01-09 MED ORDER — METOPROLOL SUCCINATE ER 25 MG PO TB24: 25.0000 mg | ORAL_TABLET | Freq: Every day | ORAL | Status: DC

## 2015-01-09 NOTE — Telephone Encounter (Signed)
I spoke with the pt's wife because Dr Alvy Bimler sent Dr Burt Knack a message that the pt was having CP and taking NTG daily. Dr Burt Knack would like the pt seen for evaluation. I have scheduled the pt to be seen today at 11:30 by Richardson Dopp PA-C.

## 2015-01-09 NOTE — Progress Notes (Signed)
Cardiology Office Note   Date:  01/09/2015   ID:  Vincent Ochoa, DOB December 19, 1955, MRN [id number]  PCP:  Vincent Ochoa, Vincent Riser, Vincent Ochoa  Cardiologist:  Dr. Sherren Mocha     Chief Complaint  Patient presents with  . Chest Pain  . Coronary Artery Disease    s/p MI and PCI in 10/2014     History of Present Illness: Vincent Ochoa is a 59 y.o. male with a hx of CAD s/p BMS to CFX in 10/2014, HL, psoriasis, thrombocytopenia, elevated LFTs, fatty liver, PAFib, OSA.  Admitted in 10/2014 with symptoms consistent with unstable angina. Myoview was abnormal and felt to be intermediate risk. Patient's cardiac catheterization was placed on hold secondary to marked thrombocytopenia and leukopenia. He was followed by hematology (Dr. 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 Patient was treated with IVIG and prednisone in hopes that his platelet count would improve. Plan was to bring him back for elective cardiac catheterization once his plate count improved. However, he was readmitted with chest discomfort and ruled in for NSTEMI. This was complicated by transient atrial fibrillation with RVR. Cardiac catheterization demonstrated high-grade circumflex disease which was treated with a bare-metal stent.  DAPT was recommended x 1 month minimum.  AFib was felt to be driven by ischemia and this was transient.  He was not felt to be an ideal candidate for anticoagulation.  FU Event Monitor demonstrated NSR.  CHADS2-VASc=1 (vascular dsz).  Hematology has felt he could remain on DAPT if PLT count remains > 30K.  However, he was taken off of Plavix after 30 days of DAPT.  Patient had epistaxis while on Plavix.  Last seen by Dr. Sherren Mocha 12/16/14.  He had some edema and Lasix was added.  FU echo is pending.  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    He saw Dr.  Alvy Bimler yesterday and noted that he was having increasing amounts of chest pain and taking prn NTG.  He is added on to my schedule for evaluation.  Patient admits to feeling well post PCI until this past weekend.  Since that time, he has exertional chest pressure and dyspnea.  This seems to come on with more moderate to extreme exertion (CCS 2).  He did have one episode of chest pain at rest several days ago that resolved with NTG.  He had assoc nausea and ? Diaphoresis.  He denies syncope.  He has slept on an incline chronically.  He is supposed to get a CPAP soon.  He thinks his LE edema is getting worse.   Studies/Reports Reviewed Today:  Cardiac Cath and PCI 10/31/14 LM:  mild, distal disease. LAD:  mild disease in the mid vessel.  LCx:  mid 99%  RCA:  mid  50%.  PCI:  2.25 x 12 mm vision BMS stent mid CFX  Echocardiogram 10/29/14 EF 60-65%. Wall motion was normal. Grade 2 diastolic dysfunction Mod LAE Mild RAE Mild TR  Lexiscan Myoview 10/29/14 Mild reversibility surrounding a more chronic infarct in the inferolateral wall, suspicious for ischemia adjacent to a more chronic infarct. Mild hypokinesis. EF 50% Intermediate-risk    Past Medical History  Diagnosis Date  . GERD (gastroesophageal reflux disease)   . Psoriasis   . Frequent headaches   . Facial cellulitis 2014    Periorbital  . Coronary artery disease  a. Evaluated by Dr. Stanford Breed 2012 - nonobstructive 2007-2008 by cath. b. Abnormal stress test 10/2014 but cath deferred temporarily due to thrombocytopenia. c. 10/2014: readm with AF-RVR/NSTEMI - s/p BMS to mLCx 11/04/14 (mod dz in RCA).   . Back pain, lumbosacral 2014  . Thrombocytopenia     a. Onset unclear, but noted 2012. ? 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 s/p prednisone, IVIG.  Marland Kitchen Hyperlipidemia   . Obesity   . Elevated LFTs   . Fatty liver US - 2012  . Atrial fibrillation     a. Episode 10/2014 at time of NSTEMI,  spont converted to NSR. Observation for further episodes (not on anticoag at present time due to [facility] = 1, thrombocytopenia).  . Sinus bradycardia   . OSA (obstructive sleep apnea)     severe with AHI 56/hr with successful CPAP titration to 18cm H2O    Past Surgical History  Procedure Laterality Date  . Tonsillectomy  1960's  . Cholecystectomy  2007  . Knee arthroplasty  1970's  . Facial cosmetic surgery  2014  . Cardiac catheterization    . Left heart catheterization with coronary angiogram N/A 11/04/2014    Procedure: LEFT HEART CATHETERIZATION WITH CORONARY ANGIOGRAM;  Surgeon: Jettie Booze, Vincent Ochoa;  Location[facility] CATH LAB;  Service: Cardiovascular;  Laterality: N/A;     Current Outpatient Prescriptions  Medication Sig Dispense Refill  . aspirin 81 MG tablet Take 1 tablet (81 mg total) by mouth daily.    Marland Kitchen atorvastatin (LIPITOR) 10 MG tablet Take 1 tablet (10 mg total) by mouth every evening. 90 tablet 3  . furosemide (LASIX) 20 MG tablet Take 1 tablet (20 mg total) by mouth daily. 90 tablet 3  . metoprolol succinate (TOPROL-XL) 25 MG 24 hr tablet Take 0.5 tablets (12.5 mg total) by mouth daily. 30 tablet 6  . Multiple Vitamin (MULTIVITAMIN WITH MINERALS) TABS tablet Take 1 tablet by mouth daily.    . nitroGLYCERIN (NITROSTAT) 0.4 MG SL tablet Place 1 tablet (0.4 mg total) under the tongue every 5 (five) minutes as needed for chest pain (up to 3 doses). 25 tablet 3  . pantoprazole (PROTONIX) 40 MG tablet Take 1 tablet (40 mg total) by mouth daily. 30 tablet 1   No current facility-administered medications for this visit.    Allergies:   Review of patient's allergies indicates no known allergies.    Social History:  The patient  reports that he quit smoking about 2 months ago. His smoking use included Cigarettes. He has a 64.5 pack-year smoking history. He has never used smokeless tobacco. He reports that he drinks alcohol. He reports that he does not use illicit drugs.    Family History:  The patient's family history includes Arthritis in his maternal grandmother; Cancer - Ovarian in his sister; Cirrhosis in his mother; Diabetes in his sister; Heart attack in his maternal aunt and mother; Heart disease in his sister and another family member; Hypertension in his father and mother; Stroke in his father.    ROS:   Please see the history of present illness.   Review of Systems  Constitution: Positive for chills and malaise/fatigue.  Respiratory: Positive for cough and wheezing.   Musculoskeletal: Positive for back pain and myalgias.  Gastrointestinal: Positive for diarrhea and vomiting.       ? Gastroenteritis last week - resolved now  All other systems reviewed and are negative.     PHYSICAL EXAM: VS:  BP 125/60  mmHg  Pulse 84  Ht 6\' 3"  (1.905 m)  Wt 302 lb (136.986 kg)  BMI 37.75 kg/m2    Wt Readings from Last 3 Encounters:  01/09/15 302 lb (136.986 kg)  01/08/15 303 lb 14.4 oz (137.848 kg)  12/16/14 300 lb (136.079 kg)     GEN: Well nourished, well developed, in no acute distress HEENT: normal Neck: no JVD, no masses Cardiac:  Normal S1/S2, RRR; no murmur, no rubs or gallops, 1+ edema  Respiratory:  clear to auscultation bilaterally, no wheezing, rhonchi or rales. GI: soft, nontender, nondistended, + BS MS: no deformity or atrophy Skin: warm and dry  Neuro:  CNs II-XII intact, Strength and sensation are intact Psych: Normal affect   EKG:  EKG is ordered today.  It demonstrates:   NSR HR 84, normal axis, NSSTTW changes   Recent Labs: 10/28/2014: Magnesium 1.7; TSH 2.210 11/03/2014: ALT 64*; Pro B Natriuretic peptide (BNP) 564.0* 11/05/2014: BUN 17; Creatinine 0.86; Potassium 5.0; Sodium 142 01/08/2015: Hemoglobin 12.1*; Platelets 60*    Lipid Panel    Component Value Date/Time   CHOL 174 10/29/2014 0455   TRIG 174* 10/29/2014 0455   HDL 24* 10/29/2014 0455   CHOLHDL 7.3 10/29/2014 0455   VLDL 35 10/29/2014 0455   LDLCALC 115*  10/29/2014 0455      ASSESSMENT AND PLAN:  1.  Substernal Chest Pain:  He has had exertional chest pain since last week.  This is similar to his prior angina.  He had one episode of resting symptoms that was relieved by NTG.  He had moderate residual disease in the RCA (50%).  PCI of his CFX demonstrated a good result.  His ECG is unchanged.  He has chronic thrombocytopenia which limits his ability to be on chronic dual antiplatelet Rx.  We discussed proceeding with cardiac catheterization vs stress nuclear study.  I reviewed this as well with Dr. Darlin Coco (DOD).  Ultimately we decided to adjust antianginals and proceed with stress testing.    -  Increase Metoprolol Succinate to 25 mg daily.    -  Arrange Lexiscan Myoview.    -  Continue ASA, prn NTG.    -  Go to ED if recurrent rest symptoms. 2.  Coronary Artery Disease:  Proceed with Myoview as noted.  Continue beta blocker, statin, ASA. 3.  Hyperlipidemia:  Continue statin.  FU lipids and LFTs pending in several weeks. 4.  Thrombocytopenia:  FU with hematology as planned.  5.  OSA:  This may explain some of his fatigue.  CPAP to start soon. 6.  Paroxysmal Atrial Fibrillation:  No recurrence.  Event monitor demonstrated NSR.  He is not an ideal candidate for anticoagulation.  Continue ASA for CAD. 7.  Edema:  Probably venous insufficiency.  Check BMET, BNP today.  Continue Lasix.  FU echo pending.  Adjust Lasix if BNP significantly elevated.   Current medicines are reviewed at length with the patient today.  The patient does not have concerns regarding medicines.  The following changes have been made:  As above.   Labs/ tests ordered today include:  Orders Placed This Encounter  Procedures  . Basic Metabolic Panel (BMET)  . B Nat Peptide  . Myocardial Perfusion Imaging  . EKG 12-Lead    Disposition:   FU with me or Dr. Sherren Mocha in 2 weeks.    Signed, Versie Starks, MHS 01/09/2015 12:32 PM    [facility] [place],  [place]  [address] Phone: [phone]; Fax: [phone]

## 2015-01-09 NOTE — Progress Notes (Signed)
Quick Note:  K+, Creatinine and BNP ok Continue current therapy Richardson Dopp, PA-C 01/09/15 5:58 pm ______

## 2015-01-09 NOTE — Patient Instructions (Signed)
Your physician has recommended you make the following change in your medication:  1. INCREASE TOPROL XL TO 25 MG DAILY  LAB WORK TODAY; BMET, BNP  Your physician has requested that you have a lexiscan myoview THIS IS TO BE DONE EARLY NEXT WEEK. For further information please visit [email or web address]. Please follow instruction sheet, as given.  Your physician recommends that you schedule a follow-up appointment in: [place], [place] IS IN THE OFFICE  IF YOU HAVE MORE CHEST PAIN AT [facility] ADVISED OK TO TAKE NTG HOWEVER; PER SCOTT WEAVER, PAC GO TO THE ER

## 2015-01-12 ENCOUNTER — Telehealth: Payer: Self-pay | Admitting: Cardiovascular Disease

## 2015-01-12 ENCOUNTER — Telehealth: Payer: Self-pay | Admitting: *Deleted

## 2015-01-12 NOTE — Telephone Encounter (Signed)
Calling requesting results of lab.  Advised Vincent Ochoa had spoken with his wife and gave her results.  States he won't talk with her until after 5 pm.  Gave him the results. Advised to continue medications as prescribed.  He is scheduled for stress test tomorrow. He verbalizes understanding regarding labs.

## 2015-01-12 NOTE — Telephone Encounter (Signed)
New message ° ° ° ° °Returning a nurses call to get lab results °

## 2015-01-12 NOTE — Telephone Encounter (Signed)
pt's wiffe notified about lab results with verbal understanding

## 2015-01-13 ENCOUNTER — Ambulatory Visit (HOSPITAL_COMMUNITY): Payer: Medicaid Other | Attending: Cardiology | Admitting: Radiology

## 2015-01-13 DIAGNOSIS — I2510 Atherosclerotic heart disease of native coronary artery without angina pectoris: Secondary | ICD-10-CM | POA: Diagnosis not present

## 2015-01-13 DIAGNOSIS — R079 Chest pain, unspecified: Secondary | ICD-10-CM | POA: Diagnosis not present

## 2015-01-13 DIAGNOSIS — R5383 Other fatigue: Secondary | ICD-10-CM | POA: Diagnosis not present

## 2015-01-13 DIAGNOSIS — E785 Hyperlipidemia, unspecified: Secondary | ICD-10-CM | POA: Insufficient documentation

## 2015-01-13 DIAGNOSIS — R0609 Other forms of dyspnea: Secondary | ICD-10-CM | POA: Diagnosis not present

## 2015-01-13 DIAGNOSIS — Z720 Tobacco use: Secondary | ICD-10-CM | POA: Insufficient documentation

## 2015-01-13 DIAGNOSIS — R42 Dizziness and giddiness: Secondary | ICD-10-CM | POA: Diagnosis not present

## 2015-01-13 DIAGNOSIS — R0602 Shortness of breath: Secondary | ICD-10-CM | POA: Diagnosis not present

## 2015-01-13 MED ORDER — TECHNETIUM TC 99M SESTAMIBI GENERIC - CARDIOLITE
33.0000 | Freq: Once | INTRAVENOUS | Status: AC | PRN
  Administered 2015-01-13: 33 via INTRAVENOUS

## 2015-01-13 MED ORDER — REGADENOSON 0.4 MG/5ML IV SOLN
0.4000 mg | Freq: Once | INTRAVENOUS | Status: AC
  Administered 2015-01-13: 0.4 mg via INTRAVENOUS

## 2015-01-13 NOTE — Progress Notes (Signed)
[facility] [address] [place], [place] [address] [phone]    Cardiology Nuclear Med Study  Vincent Ochoa is a 59 y.o. male     MRN : [id number]     DOB: [birth year]  Procedure Date: 01/13/2015  Nuclear Med Background Indication for Stress Test:  Evaluation for Ischemia and Stent Patency History:  CAD-Stents 10/30/14 MPI: Ischemia EF: 50% chronic infarct PAF Cardiac Risk Factors: Lipids and Smoker  Symptoms:  Chest Pain, Dizziness, DOE, Fatigue and SOB   Nuclear Pre-Procedure Caffeine/Decaff Intake:  None> 12 hrs NPO After: 5:30am   Lungs:  clear O2 Sat: 94% on room air. IV 0.9% NS with Angio Cath:  22g  IV Site: R Antecubital x 1, tolerated well IV Started by:  Irven Baltimore, RN  Chest Size (in):  52 Cup Size: n/a  Height: 6\' 3"  (1.905 m)  Weight:  299 lb (135.626 kg)  BMI:  Body mass index is 37.37 kg/(m^2). Tech Comments:  Patient took Toprol this am. [name].    Nuclear Med Study 1 or 2 day study: 2 day  Stress Test Type:  Carlton Adam  Reading MD: N/A  Order Authorizing Provider:  Sherren Mocha, MD, and Richardson Dopp, [facility]  Resting Radionuclide: Technetium 19m Sestamibi  Resting Radionuclide Dose: 33.0 mCi on 01/19/15   Stress Radionuclide:  Technetium 43m Sestamibi  Stress Radionuclide Dose: 33.0 mCi on 01/13/15           Stress Protocol Rest HR: 63 Stress HR: 74  Rest BP: 98/55 Stress BP: 107/54  Exercise Time (min): n/a METS: n/a   Predicted Max HR: 162 bpm % Max HR: 45.68 bpm Rate Pressure Product: 7918   Dose of Adenosine (mg):  n/a Dose of Lexiscan: 0.4 mg  Dose of Atropine (mg): n/a Dose of Dobutamine: n/a mcg/kg/min (at max HR)  Stress Test Technologist: Perrin Maltese, EMT-P  Nuclear Technologist:  Earl Many, CNMT     Rest Procedure:  Myocardial perfusion imaging was performed at rest 45 minutes following the intravenous administration of Technetium 2m Sestamibi. Rest ECG: NSR - Normal EKG  Stress  Procedure:  The patient received IV Lexiscan 0.4 mg over 15-seconds.  Technetium 45m Sestamibi injected at 30-seconds. This patient had sob and abdominal pain with the Lexiscan injection.  Quantitative spect images were obtained after a 45 minute delay. Stress ECG: No significant change from baseline ECG  QPS Raw Data Images:  Normal; no motion artifact; normal heart/lung ratio. Stress Images:  Count-poor compared to rest images.  Medium-sized, moderate basal to apical inferior and basal to mid inferolateral perfusion defect. Rest Images:  Small, mild basal to apical inferior and basal to mid inferolateral perfusion defect. Subtraction (SDS):  Partial reversibility. Transient Ischemic Dilatation (Normal <1.22):  1.10 Lung/Heart Ratio (Normal <0.45):  0.35  Quantitative Gated Spect Images QGS EDV:  208 ml QGS ESV:  118 ml  Impression Exercise Capacity:  Lexiscan with no exercise. BP Response:  Normal blood pressure response. Clinical Symptoms:  Dyspnea, abdominal pain.  ECG Impression:  No ischemic ST changes.  Comparison with Prior Nuclear Study: No images to compare  Overall Impression:  Intermediate risk stress nuclear study with a basal to apical inferior and basal to mid inferolateral perfusion defect, medium-sized.  It is partially reversible, suggestive of the presence of ischemia.  Interpretation is difficult as the stress images are relatively count-poor compared to rest images.  Low EF with inferolateral wall motion abnormality..  LV Ejection Fraction:  43%.  LV Wall Motion:  Inferolateral hypokinesis.    Loralie Champagne 01/19/2015

## 2015-01-15 ENCOUNTER — Encounter (HOSPITAL_COMMUNITY): Payer: Self-pay

## 2015-01-19 ENCOUNTER — Ambulatory Visit (HOSPITAL_COMMUNITY): Payer: Self-pay | Attending: Cardiovascular Disease

## 2015-01-19 DIAGNOSIS — R0989 Other specified symptoms and signs involving the circulatory and respiratory systems: Secondary | ICD-10-CM

## 2015-01-19 MED ORDER — TECHNETIUM TC 99M SESTAMIBI GENERIC - CARDIOLITE
30.0000 | Freq: Once | INTRAVENOUS | Status: AC | PRN
  Administered 2015-01-19: 30 via INTRAVENOUS

## 2015-01-20 ENCOUNTER — Encounter: Payer: Self-pay | Admitting: Physician Assistant

## 2015-01-20 ENCOUNTER — Telehealth: Payer: Self-pay | Admitting: *Deleted

## 2015-01-20 NOTE — Telephone Encounter (Signed)
s/w pt's wife Raina Mina on file. Linda aware of myoview results and to keep appt w/Dr. Burt Knack 01/23/15. I asked how pt was doing and she states pt very fatigued, c/o chest pressure like something heavy on his chest., but has not taken NTG. I again advised to make sure to keep appt with Dr. Burt Knack 01/23/15 @ 2:15. Wife verbalized understanding to plan of care.

## 2015-01-23 ENCOUNTER — Encounter: Payer: Self-pay | Admitting: Cardiovascular Disease

## 2015-01-23 ENCOUNTER — Ambulatory Visit: Payer: Self-pay | Admitting: Cardiovascular Disease

## 2015-01-23 ENCOUNTER — Ambulatory Visit (INDEPENDENT_AMBULATORY_CARE_PROVIDER_SITE_OTHER): Payer: Medicaid Other | Admitting: Cardiovascular Disease

## 2015-01-23 VITALS — BP 122/68 | HR 78 | Ht 75.0 in | Wt 297.0 lb

## 2015-01-23 DIAGNOSIS — R079 Chest pain, unspecified: Secondary | ICD-10-CM

## 2015-01-23 LAB — CBC WITH DIFFERENTIAL/PLATELET
BASOS ABS: 0 10*3/uL (ref 0.0–0.1)
Basophils Relative: 0.3 % (ref 0.0–3.0)
EOS ABS: 0.2 10*3/uL (ref 0.0–0.7)
Eosinophils Relative: 3.2 % (ref 0.0–5.0)
HCT: 38 % — ABNORMAL LOW (ref 39.0–52.0)
Hemoglobin: 13.1 g/dL (ref 13.0–17.0)
Lymphocytes Relative: 22.2 % (ref 12.0–46.0)
Lymphs Abs: 1.1 10*3/uL (ref 0.7–4.0)
MCHC: 34.4 g/dL (ref 30.0–36.0)
MCV: 89.9 fl (ref 78.0–100.0)
Monocytes Absolute: 0.7 10*3/uL (ref 0.1–1.0)
Monocytes Relative: 13.8 % — ABNORMAL HIGH (ref 3.0–12.0)
Neutro Abs: 2.9 10*3/uL (ref 1.4–7.7)
Neutrophils Relative %: 60.5 % (ref 43.0–77.0)
Platelets: 71 10*3/uL — ABNORMAL LOW (ref 150.0–400.0)
RBC: 4.23 Mil/uL (ref 4.22–5.81)
RDW: 15 % (ref 11.5–15.5)
WBC: 4.8 10*3/uL (ref 4.0–10.5)

## 2015-01-23 LAB — BASIC METABOLIC PANEL
BUN: 8 mg/dL (ref 6–23)
CHLORIDE: 107 meq/L (ref 96–112)
CO2: 30 mEq/L (ref 19–32)
Calcium: 9.3 mg/dL (ref 8.4–10.5)
Creatinine, Ser: 0.74 mg/dL (ref 0.40–1.50)
GFR: 115.1 mL/min (ref >60.00)
Glucose, Bld: 119 mg/dL — ABNORMAL HIGH (ref 70–99)
Potassium: 3.7 mEq/L (ref 3.5–5.1)
SODIUM: 138 meq/L (ref 135–145)

## 2015-01-23 LAB — APTT: aPTT: 29.2 s (ref 23.4–32.7)

## 2015-01-23 LAB — PROTIME-INR
INR: 1.3 ratio — ABNORMAL HIGH (ref 0.8–1.0)
Prothrombin Time: 13.9 s — ABNORMAL HIGH (ref 9.6–13.1)

## 2015-01-23 MED ORDER — ISOSORBIDE MONONITRATE ER 30 MG PO TB24: 30.0000 mg | ORAL_TABLET | Freq: Every day | ORAL | Status: DC

## 2015-01-23 NOTE — Patient Instructions (Addendum)
Your physician recommends that you return for lab work: TODAY (BMP;CBC; PTT)  Your physician has recommended you make the following change in your medication:  1) START: Take Imdur 30 mg table daily  Your physician has requested that you have a cardiac catheterization. Cardiac catheterization is used to diagnose and/or treat various heart conditions. Doctors may recommend this procedure for a number of different reasons. The most common reason is to evaluate chest pain. Chest pain can be a symptom of coronary artery disease (CAD), and cardiac catheterization can show whether plaque is narrowing or blocking your heart's arteries. This procedure is also used to evaluate the valves, as well as measure the blood flow and oxygen levels in different parts of your heart. For further information please visit [email or web address]. Please follow instruction sheet, as given.

## 2015-01-23 NOTE — Progress Notes (Signed)
Cardiology Office Note   Date:  01/23/2015   ID:  BERND CROM, DOB 1956/01/04, MRN [id number]  PCP:  Jeb Levering, Philbert Riser, MD  Cardiologist:  Sherren Mocha, MD    Chief Complaint  Patient presents with  . Fatigue    depressed     History of Present Illness: Vincent Ochoa is a 59 y.o. male who presents for follow-up of CAD.   He is followed for CAD s/p BMS to CFX in 10/2014, HL, psoriasis, thrombocytopenia, elevated LFTs, fatty liver, PAFib, OSA. Admitted in 10/2014 with symptoms consistent with unstable angina. Myoview was abnormal and felt to be intermediate risk. Patient's cardiac catheterization was placed on hold secondary to marked thrombocytopenia and leukopenia. He was followed by hematology (Dr. 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 Patient was treated with IVIG and prednisone in hopes that his platelet count would improve. Plan was to bring him back for elective cardiac catheterization once his plate count improved. However, he was readmitted with chest discomfort and ruled in for NSTEMI. This was complicated by transient atrial fibrillation with RVR. Cardiac catheterization demonstrated high-grade circumflex disease which was treated with a bare-metal stent. DAPT was recommended x 1 month minimum. AFib was felt to be driven by ischemia and this was transient. He was not felt to be an ideal candidate for anticoagulation. FU Event Monitor demonstrated NSR. CHADS2-VASc=1 (vascular dsz). Hematology has felt he could remain on DAPT if PLT count remains > 30K. However, he was taken off of Plavix after 30 days of DAPT.  He is complaining of a constant chest pressure in the left chest, states he has this 90% of the time. This is much worse with exertion and eases with rest. He also complains of marked fatigue. He falls asleep easily when he sits down. He continues to complain of shortness of breath, continues to smoke  cigarettes. Has been under a lot of social stress.   Past Medical History  Diagnosis Date  . GERD (gastroesophageal reflux disease)   . Psoriasis   . Frequent headaches   . Facial cellulitis 2014    Periorbital  . Coronary artery disease     a. Evaluated by Dr. Stanford Breed 2012 - nonobstructive 2007-2008 by cath. b. Abnormal stress test 10/2014 but cath deferred temporarily due to thrombocytopenia. c. 10/2014: readm with AF-RVR/NSTEMI - s/p BMS to mLCx 11/04/14 (mod dz in RCA). ;  d.  Lexiscan Myoview:  Inf and inf-lat defect with partial reversibility suggesting ischemia, EF 43%; difficult study; Intermediate Risk   . Back pain, lumbosacral 2014  . Thrombocytopenia     a. Onset unclear, but noted 2012. ? 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 s/p prednisone, IVIG.  Marland Kitchen Hyperlipidemia   . Obesity   . Elevated LFTs   . Fatty liver US - 2012  . Atrial fibrillation     a. Episode 10/2014 at time of NSTEMI, spont converted to NSR. Observation for further episodes (not on anticoag at present time due to [facility] = 1, thrombocytopenia).  . Sinus bradycardia   . OSA (obstructive sleep apnea)     severe with AHI 56/hr with successful CPAP titration to 18cm H2O    Past Surgical History  Procedure Laterality Date  . Tonsillectomy  1960's  . Cholecystectomy  2007  . Knee arthroplasty  1970's  . Facial cosmetic surgery  2014  . Cardiac catheterization    . Left heart catheterization  with coronary angiogram N/A 11/04/2014    Procedure: LEFT HEART CATHETERIZATION WITH CORONARY ANGIOGRAM;  Surgeon: Jettie Booze, MD;  Location: [facility] CATH LAB;  Service: Cardiovascular;  Laterality: N/A;    Current Outpatient Prescriptions  Medication Sig Dispense Refill  . aspirin 81 MG tablet Take 1 tablet (81 mg total) by mouth daily.    Marland Kitchen atorvastatin (LIPITOR) 10 MG tablet Take 1 tablet (10 mg total) by mouth every evening. 90 tablet 3  . furosemide (LASIX) 20 MG tablet  Take 1 tablet (20 mg total) by mouth daily. 90 tablet 3  . metoprolol succinate (TOPROL-XL) 25 MG 24 hr tablet Take 1 tablet (25 mg total) by mouth daily. 30 tablet 11  . Multiple Vitamin (MULTIVITAMIN WITH MINERALS) TABS tablet Take 1 tablet by mouth daily.    . nitroGLYCERIN (NITROSTAT) 0.4 MG SL tablet Place 1 tablet (0.4 mg total) under the tongue every 5 (five) minutes as needed for chest pain (up to 3 doses). 25 tablet 3  . pantoprazole (PROTONIX) 40 MG tablet Take 1 tablet (40 mg total) by mouth daily. 30 tablet 1  . isosorbide mononitrate (IMDUR) 30 MG 24 hr tablet Take 1 tablet (30 mg total) by mouth daily. 30 tablet 11   No current facility-administered medications for this visit.    Allergies:   Review of patient's allergies indicates no known allergies.   Social History:  The patient  reports that he quit smoking about 2 months ago. His smoking use included Cigarettes. He has a 64.5 pack-year smoking history. He has never used smokeless tobacco. He reports that he drinks alcohol. He reports that he does not use illicit drugs.   Family History:  The patient's  family history includes Arthritis in his maternal grandmother; Cancer - Ovarian in his sister; Cirrhosis in his mother; Diabetes in his sister; Heart attack in his maternal aunt and mother; Heart disease in his sister and another family member; Hypertension in his father and mother; Stroke in his father.    ROS:  Please see the history of present illness.  Otherwise, review of systems is positive for chills, shortness of breath, cough, depression, back pain, chest pressure, snoring, and joint swelling.  All other systems are reviewed and negative.    PHYSICAL EXAM: VS:  BP 122/68 mmHg  Pulse 78  Ht 6\' 3"  (1.905 m)  Wt 297 lb (134.718 kg)  BMI 37.12 kg/m2  SpO2 93% , BMI Body mass index is 37.12 kg/(m^2). GEN: Well nourished, well developed, pleasant obese male in no acute distress HEENT: normal Neck: no JVD, no masses,  no carotid bruits Cardiac: RRR without murmur or gallop                Respiratory:  clear to auscultation bilaterally, normal work of breathing GI: soft, nontender, nondistended, + BS MS: no deformity or atrophy Ext: no pretibial edema Skin: warm and dry, no rash Neuro:  Strength and sensation are intact Psych: euthymic mood, full affect  EKG:  EKG is not ordered today.  Recent Labs: 10/28/2014: Magnesium 1.7; TSH 2.210 11/03/2014: ALT 64* 01/08/2015: Hemoglobin 12.1*; Platelets 60* 01/09/2015: BUN 9; Creatinine 0.78; Potassium 3.9; Pro B Natriuretic peptide (BNP) 43.0; Sodium 140   Lipid Panel     Component Value Date/Time   CHOL 174 10/29/2014 0455   TRIG 174* 10/29/2014 0455   HDL 24* 10/29/2014 0455   CHOLHDL 7.3 10/29/2014 0455   VLDL 35 10/29/2014 0455   LDLCALC 115* 10/29/2014 0455  Wt Readings from Last 3 Encounters:  01/23/15 297 lb (134.718 kg)  01/13/15 299 lb (135.626 kg)  01/09/15 302 lb (136.986 kg)     Cardiac Studies Reviewed: Myoview: Overall Impression: Intermediate risk stress nuclear study with a basal to apical inferior and basal to mid inferolateral perfusion defect, medium-sized. It is partially reversible, suggestive of the presence of ischemia. Interpretation is difficult as the stress images are relatively count-poor compared to rest images. Low EF with inferolateral wall motion abnormality..  LV Ejection Fraction: 43%. LV Wall Motion: Inferolateral hypokinesis.  ASSESSMENT AND PLAN: 1.  Coronary artery disease, native vessel, with regressive symptoms of angina, CCS class 3-4. The patient has recurrent symptoms of fairly severe angina. I reviewed his last cardiac catheterization films where he had nonobstructive disease in the LAD and right coronary artery and tight stenosis of the distal left circumflex which supplied a fairly small territory myocardium. He did have temporary anginal relief after PCI but this is recurred. He was treated  with a bare-metal stent. In the setting of progressive symptoms, I think cardiac catheterization and possible PCI are clearly indicated. In addition, he has an abnormal stress Myoview which is an intermediate risk study and demonstrates ischemic changes in the region of his previous PCI.  I have reviewed the risks, indications, and alternatives to cardiac catheterization with the patient and his wife. Risks include but are not limited to bleeding, infection, vascular injury, stroke, myocardial infection, arrhythmia, kidney injury, radiation-related injury in the case of prolonged fluoroscopy use, emergency cardiac surgery, and death. The patient understands the risks of serious complication is low (<5%).   In addition, the patient will be started on isosorbide 30 mg daily.  2. Thrombocytopenia: Patient's recent CBC shows a platelet count 60,000. We should be able to proceed with cardiac catheterization from radial access without too much bleeding risk. If he requires recurrent PCI, we'll have to take into consideration potential problems related to long-term dual antiplatelet therapy. Recent hematology evaluation reviewed.   3. Tobacco abuse: cessation counselling done.   4. Hyperlipidemia: pt on atorvastatin. Lipids from 12/15 reviewed. He will need labs updated in the near future.   Current medicines are reviewed with the patient today.  The patient does not have concerns regarding medicines.  The following changes have been made:  Add Imdur 30 mg daily.  Labs/ tests ordered today include:   Orders Placed This Encounter  Procedures  . PTT  . Basic Metabolic Panel (BMET)  . CBC with Differential    Disposition:   FU pending cardiac cath results.   Signed, Sherren Mocha, MD  01/23/2015 3:06 PM    [facility] [address], [place], [place]  [address] Phone: [phone]; Fax: [phone]

## 2015-01-27 ENCOUNTER — Other Ambulatory Visit (HOSPITAL_COMMUNITY): Payer: Self-pay

## 2015-01-27 ENCOUNTER — Other Ambulatory Visit: Payer: Self-pay

## 2015-01-28 ENCOUNTER — Encounter (HOSPITAL_COMMUNITY): Payer: Self-pay | Admitting: Cardiovascular Disease

## 2015-01-28 ENCOUNTER — Encounter (HOSPITAL_COMMUNITY)
Admission: RE | Disposition: A | Discharge status: Home / Self Care | Payer: Self-pay | Source: Ambulatory Visit | Attending: Cardiovascular Disease

## 2015-01-28 ENCOUNTER — Other Ambulatory Visit: Payer: Self-pay | Admitting: Cardiovascular Disease

## 2015-01-28 ENCOUNTER — Ambulatory Visit (HOSPITAL_COMMUNITY)
Admission: RE | Admit: 2015-01-28 | Discharge: 2015-01-28 | Disposition: A | Discharge status: Home / Self Care | Payer: Medicaid Other | Source: Ambulatory Visit | Attending: Cardiovascular Disease | Admitting: Cardiovascular Disease

## 2015-01-28 DIAGNOSIS — I208 Other forms of angina pectoris: Secondary | ICD-10-CM

## 2015-01-28 DIAGNOSIS — I2510 Atherosclerotic heart disease of native coronary artery without angina pectoris: Secondary | ICD-10-CM

## 2015-01-28 DIAGNOSIS — D696 Thrombocytopenia, unspecified: Secondary | ICD-10-CM | POA: Diagnosis not present

## 2015-01-28 DIAGNOSIS — I252 Old myocardial infarction: Secondary | ICD-10-CM | POA: Insufficient documentation

## 2015-01-28 DIAGNOSIS — E785 Hyperlipidemia, unspecified: Secondary | ICD-10-CM | POA: Insufficient documentation

## 2015-01-28 DIAGNOSIS — Z6837 Body mass index (BMI) 37.0-37.9, adult: Secondary | ICD-10-CM | POA: Insufficient documentation

## 2015-01-28 DIAGNOSIS — E669 Obesity, unspecified: Secondary | ICD-10-CM | POA: Insufficient documentation

## 2015-01-28 DIAGNOSIS — Z9889 Other specified postprocedural states: Secondary | ICD-10-CM | POA: Diagnosis not present

## 2015-01-28 DIAGNOSIS — Z79899 Other long term (current) drug therapy: Secondary | ICD-10-CM | POA: Diagnosis not present

## 2015-01-28 DIAGNOSIS — L409 Psoriasis, unspecified: Secondary | ICD-10-CM | POA: Diagnosis not present

## 2015-01-28 DIAGNOSIS — Z7982 Long term (current) use of aspirin: Secondary | ICD-10-CM | POA: Diagnosis not present

## 2015-01-28 DIAGNOSIS — G4733 Obstructive sleep apnea (adult) (pediatric): Secondary | ICD-10-CM | POA: Diagnosis not present

## 2015-01-28 DIAGNOSIS — R079 Chest pain, unspecified: Secondary | ICD-10-CM | POA: Diagnosis present

## 2015-01-28 DIAGNOSIS — I480 Paroxysmal atrial fibrillation: Secondary | ICD-10-CM | POA: Diagnosis not present

## 2015-01-28 DIAGNOSIS — K219 Gastro-esophageal reflux disease without esophagitis: Secondary | ICD-10-CM | POA: Diagnosis not present

## 2015-01-28 DIAGNOSIS — Z87891 Personal history of nicotine dependence: Secondary | ICD-10-CM | POA: Diagnosis not present

## 2015-01-28 DIAGNOSIS — I2089 Other forms of angina pectoris: Secondary | ICD-10-CM | POA: Insufficient documentation

## 2015-01-28 HISTORY — PX: LEFT HEART CATHETERIZATION WITH CORONARY ANGIOGRAM: SHX5451

## 2015-01-28 SURGERY — LEFT HEART CATHETERIZATION WITH CORONARY ANGIOGRAM
Anesthesia: LOCAL

## 2015-01-28 MED ORDER — ASPIRIN 81 MG PO CHEW: 81.0000 mg | CHEWABLE_TABLET | ORAL | Status: DC

## 2015-01-28 MED ORDER — NITROGLYCERIN 1 MG/10 ML FOR IR/CATH LAB
INTRA_ARTERIAL | Status: AC
  Filled 2015-01-28: qty 10

## 2015-01-28 MED ORDER — LIDOCAINE HCL (PF) 1 % IJ SOLN
INTRAMUSCULAR | Status: AC
  Filled 2015-01-28: qty 30

## 2015-01-28 MED ORDER — HEPARIN (PORCINE) IN NACL 2-0.9 UNIT/ML-% IJ SOLN
INTRAMUSCULAR | Status: AC
  Filled 2015-01-28: qty 1000

## 2015-01-28 MED ORDER — SODIUM CHLORIDE 0.9 % IV SOLN: 1.0000 mL/kg/h | INTRAVENOUS | Status: DC

## 2015-01-28 MED ORDER — MIDAZOLAM HCL 2 MG/2ML IJ SOLN
INTRAMUSCULAR | Status: AC
  Filled 2015-01-28: qty 2

## 2015-01-28 MED ORDER — VERAPAMIL HCL 2.5 MG/ML IV SOLN
INTRAVENOUS | Status: AC
  Filled 2015-01-28: qty 2

## 2015-01-28 MED ORDER — SODIUM CHLORIDE 0.9 % IJ SOLN: 3.0000 mL | Freq: Two times a day (BID) | INTRAMUSCULAR | Status: DC

## 2015-01-28 MED ORDER — SODIUM CHLORIDE 0.9 % IJ SOLN: 3.0000 mL | INTRAMUSCULAR | Status: DC | PRN

## 2015-01-28 MED ORDER — HEPARIN SODIUM (PORCINE) 1000 UNIT/ML IJ SOLN
INTRAMUSCULAR | Status: AC
  Filled 2015-01-28: qty 1

## 2015-01-28 MED ORDER — ACETAMINOPHEN 325 MG PO TABS: 650.0000 mg | ORAL_TABLET | ORAL | Status: DC | PRN

## 2015-01-28 MED ORDER — FENTANYL CITRATE 0.05 MG/ML IJ SOLN
INTRAMUSCULAR | Status: AC
  Filled 2015-01-28: qty 2

## 2015-01-28 MED ORDER — SODIUM CHLORIDE 0.9 % IV SOLN: INTRAVENOUS | Status: DC

## 2015-01-28 MED ORDER — ONDANSETRON HCL 4 MG/2ML IJ SOLN: 4.0000 mg | Freq: Four times a day (QID) | INTRAMUSCULAR | Status: DC | PRN

## 2015-01-28 MED ORDER — ASPIRIN 81 MG PO CHEW
CHEWABLE_TABLET | ORAL | Status: AC
  Administered 2015-01-28: 81 mg
  Filled 2015-01-28: qty 1

## 2015-01-28 MED ORDER — SODIUM CHLORIDE 0.9 % IV SOLN: 250.0000 mL | INTRAVENOUS | Status: DC | PRN

## 2015-01-28 NOTE — Interval H&P Note (Signed)
History and Physical Interval Note:  01/28/2015 2:36 PM  Vincent Ochoa  has presented today for surgery, with the diagnosis of cp  The various methods of treatment have been discussed with the patient and family. After consideration of risks, benefits and other options for treatment, the patient has consented to  Procedure(s): LEFT HEART CATHETERIZATION WITH CORONARY ANGIOGRAM (N/A) as a surgical intervention .  The patient's history has been reviewed, patient examined, no change in status, stable for surgery.  I have reviewed the patient's chart and labs.  Questions were answered to the patient's satisfaction.    Cath Lab Visit (complete for each Cath Lab visit)  Clinical Evaluation Leading to the Procedure:   ACS: No.  Non-ACS:    Anginal Classification: CCS III  Anti-ischemic medical therapy: Maximal Therapy (2 or more classes of medications)  Non-Invasive Test Results: Intermediate-risk stress test findings: cardiac mortality 1-3%/year  Prior CABG: No previous CABG       Sherren Mocha

## 2015-01-28 NOTE — Discharge Instructions (Signed)
Radial Site Care °Refer to this sheet in the next few weeks. These instructions provide you with information on caring for yourself after your procedure. Your caregiver may also give you more specific instructions. Your treatment has been planned according to current medical practices, but problems sometimes occur. Call your caregiver if you have any problems or questions after your procedure. °HOME CARE INSTRUCTIONS °· You may shower the day after the procedure. Remove the bandage (dressing) and gently wash the site with plain soap and water. Gently pat the site dry. °· Do not apply powder or lotion to the site. °· Do not submerge the affected site in water for 3 to 5 days. °· Inspect the site at least twice daily. °· Do not flex or bend the affected arm for 24 hours. °· No lifting over 5 pounds (2.3 kg) for 5 days after your procedure. °· Do not drive home if you are discharged the same day of the procedure. Have someone else drive you. °· You may drive 24 hours after the procedure unless otherwise instructed by your caregiver. °· Do not operate machinery or power tools for 24 hours. °· A responsible adult should be with you for the first 24 hours after you arrive home. °What to expect: °· Any bruising will usually fade within 1 to 2 weeks. °· Blood that collects in the tissue (hematoma) may be painful to the touch. It should usually decrease in size and tenderness within 1 to 2 weeks. °SEEK IMMEDIATE MEDICAL CARE IF: °· You have unusual pain at the radial site. °· You have redness, warmth, swelling, or pain at the radial site. °· You have drainage (other than a small amount of blood on the dressing). °· You have chills. °· You have a fever or persistent symptoms for more than 72 hours. °· You have a fever and your symptoms suddenly get worse. °· Your arm becomes pale, cool, tingly, or numb. °· You have heavy bleeding from the site. Hold pressure on the site. °Document Released: 12/10/2010 Document Revised:  01/30/2012 Document Reviewed: 12/10/2010 °Exit[employer]® Patient Information ©2015 [employer]. This information is not intended to replace advice given to you by your health care provider. Make sure you discuss any questions you have with your health care provider. ° °

## 2015-01-28 NOTE — CV Procedure (Signed)
    Cardiac Catheterization Procedure Note  Name: Vincent Ochoa MRN: [id number] DOB: 17-Jun-1956  Procedure: Left Heart Cath, Selective Coronary Angiography, LV angiography  Indication: CAD, recurrent chest pain, abnormal Myoview   Procedural Details: The right wrist was prepped, draped, and anesthetized with 1% lidocaine. Using the modified Seldinger technique, a 5/6 French Slender sheath was introduced into the right radial artery. 3 mg of verapamil was administered through the sheath, weight-based unfractionated heparin was administered intravenously. Standard Judkins catheters were used for selective coronary angiography and left ventriculography. Catheter exchanges were performed over an exchange length guidewire. There were no immediate procedural complications. A TR band was used for radial hemostasis at the completion of the procedure.  The patient was transferred to the post catheterization recovery area for further monitoring.  Procedural Findings: Hemodynamics: AO 90/54 LV 92/11  Coronary angiography: Coronary dominance: right  Left mainstem:  The left main is patent. There is moderate calcification present. The distal left main has 20-30% stenosis as it divides into the LAD and left circumflex.  Left anterior descending (LAD):  The LAD is large in caliber. The proximal LAD is irregular with 20-30% stenosis. The mid and distal LAD are patent without disease. The LAD has diffuse calcification. The diagonal branches are patent without significant stenosis.  Left circumflex (LCx):  Left circumflex is large in caliber. The vessel is patent and the obtuse marginal branches are all patent. The distal circumflex is patent at the stent site with 20-30% stenosis.  Right coronary artery (RCA):  The right coronary artery is patent throughout. The mid vessel has 40-50% stenosis at most. The distal vessel and PDA branches are widely patent.  Left ventriculography: Left ventricular systolic  function is normal, LVEF is estimated at 55-60%, there is no significant mitral regurgitation   Estimated Blood Loss: minimal  Final Conclusions:    1. Mild nonobstructive coronary artery disease with 40-50% stenosis of the right coronary artery, minor LAD stenosis, and continued patency of the stented segment the left circumflex with mild in-stent restenosis.   2. Normal LV systolic function with normal LVEDP.  Recommendations: Continue current medical therapy.  Sherren Mocha MD, [facility] 01/28/2015, 3:13 PM

## 2015-01-28 NOTE — H&P (View-Only) (Signed)
Cardiology Office Note   Date:  01/23/2015   ID:  CASIN FEDERICI, DOB 1956-01-20, MRN [id number]  PCP:  Jeb Levering, Philbert Riser, MD  Cardiologist:  Sherren Mocha, MD    Chief Complaint  Patient presents with  . Fatigue    depressed     History of Present Illness: [facility] Ochoa is a 59 y.o. male who presents for follow-up of CAD.   He is followed for CAD s/p BMS to CFX in 10/2014, HL, psoriasis, thrombocytopenia, elevated LFTs, fatty liver, PAFib, OSA. Admitted in 10/2014 with symptoms consistent with unstable angina. Myoview was abnormal and felt to be intermediate risk. Patient's cardiac catheterization was placed on hold secondary to marked thrombocytopenia and leukopenia. He was followed by hematology (Dr. 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 Patient was treated with IVIG and prednisone in hopes that his platelet count would improve. Plan was to bring him back for elective cardiac catheterization once his plate count improved. However, he was readmitted with chest discomfort and ruled in for NSTEMI. This was complicated by transient atrial fibrillation with RVR. Cardiac catheterization demonstrated high-grade circumflex disease which was treated with a bare-metal stent. DAPT was recommended x 1 month minimum. AFib was felt to be driven by ischemia and this was transient. He was not felt to be an ideal candidate for anticoagulation. FU Event Monitor demonstrated NSR. CHADS2-VASc=1 (vascular dsz). Hematology has felt he could remain on DAPT if PLT count remains > 30K. However, he was taken off of Plavix after 30 days of DAPT.  He is complaining of a constant chest pressure in the left chest, states he has this 90% of the time. This is much worse with exertion and eases with rest. He also complains of marked fatigue. He falls asleep easily when he sits down. He continues to complain of shortness of breath, continues to smoke  cigarettes. Has been under a lot of social stress.   Past Medical History  Diagnosis Date  . GERD (gastroesophageal reflux disease)   . Psoriasis   . Frequent headaches   . Facial cellulitis 2014    Periorbital  . Coronary artery disease     a. Evaluated by Dr. Stanford Breed 2012 - nonobstructive 2007-2008 by cath. b. Abnormal stress test 10/2014 but cath deferred temporarily due to thrombocytopenia. c. 10/2014: readm with AF-RVR/NSTEMI - s/p BMS to mLCx 11/04/14 (mod dz in RCA). ;  d.  Lexiscan Myoview:  Inf and inf-lat defect with partial reversibility suggesting ischemia, EF 43%; difficult study; Intermediate Risk   . Back pain, lumbosacral 2014  . Thrombocytopenia     a. Onset unclear, but noted 2012. ? 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 s/p prednisone, IVIG.  Marland Kitchen Hyperlipidemia   . Obesity   . Elevated LFTs   . Fatty liver US - 2012  . Atrial fibrillation     a. Episode 10/2014 at time of NSTEMI, spont converted to NSR. Observation for further episodes (not on anticoag at present time due to [facility] = 1, thrombocytopenia).  . Sinus bradycardia   . OSA (obstructive sleep apnea)     severe with AHI 56/hr with successful CPAP titration to 18cm H2O    Past Surgical History  Procedure Laterality Date  . Tonsillectomy  1960's  . Cholecystectomy  2007  . Knee arthroplasty  1970's  . Facial cosmetic surgery  2014  . Cardiac catheterization    . Left heart catheterization  with coronary angiogram N/A 11/04/2014    Procedure: LEFT HEART CATHETERIZATION WITH CORONARY ANGIOGRAM;  Surgeon: Jettie Booze, MD;  Location: [facility] CATH LAB;  Service: Cardiovascular;  Laterality: N/A;    Current Outpatient Prescriptions  Medication Sig Dispense Refill  . aspirin 81 MG tablet Take 1 tablet (81 mg total) by mouth daily.    Marland Kitchen atorvastatin (LIPITOR) 10 MG tablet Take 1 tablet (10 mg total) by mouth every evening. 90 tablet 3  . furosemide (LASIX) 20 MG tablet  Take 1 tablet (20 mg total) by mouth daily. 90 tablet 3  . metoprolol succinate (TOPROL-XL) 25 MG 24 hr tablet Take 1 tablet (25 mg total) by mouth daily. 30 tablet 11  . Multiple Vitamin (MULTIVITAMIN WITH MINERALS) TABS tablet Take 1 tablet by mouth daily.    . nitroGLYCERIN (NITROSTAT) 0.4 MG SL tablet Place 1 tablet (0.4 mg total) under the tongue every 5 (five) minutes as needed for chest pain (up to 3 doses). 25 tablet 3  . pantoprazole (PROTONIX) 40 MG tablet Take 1 tablet (40 mg total) by mouth daily. 30 tablet 1  . isosorbide mononitrate (IMDUR) 30 MG 24 hr tablet Take 1 tablet (30 mg total) by mouth daily. 30 tablet 11   No current facility-administered medications for this visit.    Allergies:   Review of patient's allergies indicates no known allergies.   Social History:  The patient  reports that he quit smoking about 2 months ago. His smoking use included Cigarettes. He has a 64.5 pack-year smoking history. He has never used smokeless tobacco. He reports that he drinks alcohol. He reports that he does not use illicit drugs.   Family History:  The patient's  family history includes Arthritis in his maternal grandmother; Cancer - Ovarian in his sister; Cirrhosis in his mother; Diabetes in his sister; Heart attack in his maternal aunt and mother; Heart disease in his sister and another family member; Hypertension in his father and mother; Stroke in his father.    ROS:  Please see the history of present illness.  Otherwise, review of systems is positive for chills, shortness of breath, cough, depression, back pain, chest pressure, snoring, and joint swelling.  All other systems are reviewed and negative.    PHYSICAL EXAM: VS:  BP 122/68 mmHg  Pulse 78  Ht 6\' 3"  (1.905 m)  Wt 297 lb (134.718 kg)  BMI 37.12 kg/m2  SpO2 93% , BMI Body mass index is 37.12 kg/(m^2). GEN: Well nourished, well developed, pleasant obese male in no acute distress HEENT: normal Neck: no JVD, no masses,  no carotid bruits Cardiac: RRR without murmur or gallop                Respiratory:  clear to auscultation bilaterally, normal work of breathing GI: soft, nontender, nondistended, + BS MS: no deformity or atrophy Ext: no pretibial edema Skin: warm and dry, no rash Neuro:  Strength and sensation are intact Psych: euthymic mood, full affect  EKG:  EKG is not ordered today.  Recent Labs: 10/28/2014: Magnesium 1.7; TSH 2.210 11/03/2014: ALT 64* 01/08/2015: Hemoglobin 12.1*; Platelets 60* 01/09/2015: BUN 9; Creatinine 0.78; Potassium 3.9; Pro B Natriuretic peptide (BNP) 43.0; Sodium 140   Lipid Panel     Component Value Date/Time   CHOL 174 10/29/2014 0455   TRIG 174* 10/29/2014 0455   HDL 24* 10/29/2014 0455   CHOLHDL 7.3 10/29/2014 0455   VLDL 35 10/29/2014 0455   LDLCALC 115* 10/29/2014 0455  Wt Readings from Last 3 Encounters:  01/23/15 297 lb (134.718 kg)  01/13/15 299 lb (135.626 kg)  01/09/15 302 lb (136.986 kg)     Cardiac Studies Reviewed: Myoview: Overall Impression: Intermediate risk stress nuclear study with a basal to apical inferior and basal to mid inferolateral perfusion defect, medium-sized. It is partially reversible, suggestive of the presence of ischemia. Interpretation is difficult as the stress images are relatively count-poor compared to rest images. Low EF with inferolateral wall motion abnormality..  LV Ejection Fraction: 43%. LV Wall Motion: Inferolateral hypokinesis.  ASSESSMENT AND PLAN: 1.  Coronary artery disease, native vessel, with regressive symptoms of angina, CCS class 3-4. The patient has recurrent symptoms of fairly severe angina. I reviewed his last cardiac catheterization films where he had nonobstructive disease in the LAD and right coronary artery and tight stenosis of the distal left circumflex which supplied a fairly small territory myocardium. He did have temporary anginal relief after PCI but this is recurred. He was treated  with a bare-metal stent. In the setting of progressive symptoms, I think cardiac catheterization and possible PCI are clearly indicated. In addition, he has an abnormal stress Myoview which is an intermediate risk study and demonstrates ischemic changes in the region of his previous PCI.  I have reviewed the risks, indications, and alternatives to cardiac catheterization with the patient and his wife. Risks include but are not limited to bleeding, infection, vascular injury, stroke, myocardial infection, arrhythmia, kidney injury, radiation-related injury in the case of prolonged fluoroscopy use, emergency cardiac surgery, and death. The patient understands the risks of serious complication is low (<5%).   In addition, the patient will be started on isosorbide 30 mg daily.  2. Thrombocytopenia: Patient's recent CBC shows a platelet count 60,000. We should be able to proceed with cardiac catheterization from radial access without too much bleeding risk. If he requires recurrent PCI, we'll have to take into consideration potential problems related to long-term dual antiplatelet therapy. Recent hematology evaluation reviewed.   3. Tobacco abuse: cessation counselling done.   4. Hyperlipidemia: pt on atorvastatin. Lipids from 12/15 reviewed. He will need labs updated in the near future.   Current medicines are reviewed with the patient today.  The patient does not have concerns regarding medicines.  The following changes have been made:  Add Imdur 30 mg daily.  Labs/ tests ordered today include:   Orders Placed This Encounter  Procedures  . PTT  . Basic Metabolic Panel (BMET)  . CBC with Differential    Disposition:   FU pending cardiac cath results.   Signed, Sherren Mocha, MD  01/23/2015 3:06 PM    [facility] [address], [place], [place]  [address] Phone: [phone]; Fax: [phone]

## 2015-01-29 DIAGNOSIS — I208 Other forms of angina pectoris: Secondary | ICD-10-CM | POA: Insufficient documentation

## 2015-02-05 ENCOUNTER — Telehealth: Payer: Self-pay | Admitting: Hematology and Oncology

## 2015-02-05 ENCOUNTER — Ambulatory Visit (HOSPITAL_BASED_OUTPATIENT_CLINIC_OR_DEPARTMENT_OTHER): Payer: Medicaid Other | Admitting: Hematology and Oncology

## 2015-02-05 ENCOUNTER — Other Ambulatory Visit (HOSPITAL_BASED_OUTPATIENT_CLINIC_OR_DEPARTMENT_OTHER): Payer: Medicaid Other

## 2015-02-05 ENCOUNTER — Encounter: Payer: Self-pay | Admitting: Hematology and Oncology

## 2015-02-05 VITALS — BP 112/62 | HR 78 | Temp 98.2°F | Resp 21 | Ht 75.0 in | Wt 299.3 lb

## 2015-02-05 DIAGNOSIS — D696 Thrombocytopenia, unspecified: Secondary | ICD-10-CM | POA: Diagnosis not present

## 2015-02-05 DIAGNOSIS — Z720 Tobacco use: Secondary | ICD-10-CM

## 2015-02-05 DIAGNOSIS — L409 Psoriasis, unspecified: Secondary | ICD-10-CM

## 2015-02-05 DIAGNOSIS — I214 Non-ST elevation (NSTEMI) myocardial infarction: Secondary | ICD-10-CM | POA: Diagnosis not present

## 2015-02-05 LAB — CBC WITH DIFFERENTIAL/PLATELET
BASO%: 0.3 % (ref 0.0–2.0)
BASOS ABS: 0 10*3/uL (ref 0.0–0.1)
EOS ABS: 0.1 10*3/uL (ref 0.0–0.5)
EOS%: 3.8 % (ref 0.0–7.0)
HEMATOCRIT: 36.7 % — AB (ref 38.4–49.9)
HEMOGLOBIN: 12.3 g/dL — AB (ref 13.0–17.1)
LYMPH#: 1.1 10*3/uL (ref 0.9–3.3)
LYMPH%: 28.4 % (ref 14.0–49.0)
MCH: 30.7 pg (ref 27.2–33.4)
MCHC: 33.5 g/dL (ref 32.0–36.0)
MCV: 91.5 fL (ref 79.3–98.0)
MONO#: 0.5 10*3/uL (ref 0.1–0.9)
MONO%: 14.6 % — ABNORMAL HIGH (ref 0.0–14.0)
NEUT#: 2 10*3/uL (ref 1.5–6.5)
NEUT%: 52.9 % (ref 39.0–75.0)
PLATELETS: 46 10*3/uL — AB (ref 140–400)
RBC: 4.01 10*6/uL — ABNORMAL LOW (ref 4.20–5.82)
RDW: 14.8 % — ABNORMAL HIGH (ref 11.0–14.6)
WBC: 3.7 10*3/uL — ABNORMAL LOW (ref 4.0–10.3)

## 2015-02-05 NOTE — Assessment & Plan Note (Signed)
He has significant improvement of symptoms since his last saw his cardiologist. Recent heart catheterization did not show any significant disease warranting further intervention. He is put on pain extended-release nitrates and he denies further chest pain or shortness of breath.

## 2015-02-05 NOTE — Assessment & Plan Note (Signed)
The psoriasis is stable but does not bother him. He improved while on prednisone but since then had relapsed.

## 2015-02-05 NOTE — Assessment & Plan Note (Signed)
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 I would not recommend any further treatment unless absolutely necessary.The goal would be just to keep his platelet count above 50,000. He has completed prednisone taper in February.  I have made him an appointment to come back in 1 month.  He will continue dual antiplatelet agents as long as his platelet count remained above 30,000. I explained to the patient the rationale of continuing antiplatelet agents with chronic thrombocytopenia.

## 2015-02-05 NOTE — Telephone Encounter (Signed)
Pt confirmed labs/ov per 03/17 POF, gave pt AVS and Calendar..... KJ

## 2015-02-05 NOTE — Assessment & Plan Note (Signed)
He started smoking again due to stress at home. Again, I counseled the patient to stop smoking.

## 2015-02-05 NOTE — Progress Notes (Signed)
[facility] OFFICE PROGRESS NOTE  [place] [name], MD SUMMARY OF HEMATOLOGIC HISTORY:  [facility]in[facility] Ochoa 59 y.o. male with a history of cardiac disease was admitted via Emergency Department for recurrent chest pain.He was initially seen in consultation on 12/8 for thrombocytopenia, felt to be likely autoimmune (related to psoriasis and liver disease), responding well to 2 doses of IVIG in addition to prednisone and transfusions. Patient had been discharged on 12/11 home with baby aspirin. He was subsequently readmitted to the hospital again on 11/03/2014 with recurrent chest pain and subsequently underwent cardiac catheterization and placement of bare metal stent on 11/04/2014. On 12/31/2014, he discontinued prednisone INTERVAL HISTORY: Vincent Ochoa 59 y.o. male returns for further follow-up. He had recent repeat heart catheterization without significant stenosis. The patient continues to smoke. He denies further chest pain or shortness of breath. He bruises easily. He had relapse of psoriasis since discontinuation of prednisone but it does not bother him. The patient denies any recent signs or symptoms of bleeding such as spontaneous epistaxis, hematuria or hematochezia.  I have reviewed the past medical history, past surgical history, social history and family history with the patient and they are unchanged from previous note.  ALLERGIES:  has No Known Allergies.  MEDICATIONS:  Current Outpatient Prescriptions  Medication Sig Dispense Refill  . aspirin 81 MG tablet Take 1 tablet (81 mg total) by mouth daily.    Marland Kitchen atorvastatin (LIPITOR) 10 MG tablet Take 1 tablet (10 mg total) by mouth every evening. 90 tablet 3  . clopidogrel (PLAVIX) 75 MG tablet Take 75 mg by mouth daily.    . furosemide (LASIX) 20 MG tablet Take 1 tablet (20 mg total) by mouth daily. (Patient taking differently: Take 10 mg by mouth daily. ) 90 tablet 3  . isosorbide mononitrate (IMDUR) 30 MG  24 hr tablet Take 1 tablet (30 mg total) by mouth daily. 30 tablet 11  . metoprolol succinate (TOPROL-XL) 25 MG 24 hr tablet Take 1 tablet (25 mg total) by mouth daily. 30 tablet 11  . Multiple Vitamin (MULTIVITAMIN WITH MINERALS) TABS tablet Take 1 tablet by mouth daily.    . nitroGLYCERIN (NITROSTAT) 0.4 MG SL tablet Place 1 tablet (0.4 mg total) under the tongue every 5 (five) minutes as needed for chest pain (up to 3 doses). 25 tablet 3  . pantoprazole (PROTONIX) 40 MG tablet Take 1 tablet (40 mg total) by mouth daily. 30 tablet 1   No current facility-administered medications for this visit.     REVIEW OF SYSTEMS:   Constitutional: Denies fevers, chills or night sweats Eyes: Denies blurriness of vision Ears, nose, mouth, throat, and face: Denies mucositis or sore throat Respiratory: Denies cough, dyspnea or wheezes Cardiovascular: Denies palpitation, chest discomfort or lower extremity swelling Gastrointestinal:  Denies nausea, heartburn or change in bowel habits Skin: Denies abnormal skin rashes Lymphatics: Denies new lymphadenopathy  Neurological:Denies numbness, tingling or new weaknesses Behavioral/Psych: Mood is stable, no new changes  All other systems were reviewed with the patient and are negative.  PHYSICAL EXAMINATION: ECOG PERFORMANCE STATUS: 0 - Asymptomatic  Filed Vitals:   02/05/15 1459  BP: 112/62  Pulse: 78  Temp: 98.2 F (36.8 C)  Resp: 21   Filed Weights   02/05/15 1459  Weight: 299 lb 4.8 oz (135.762 kg)    GENERAL:alert, no distress and comfortable. He is morbidly obese SKIN: No skin bruising and psoriasis plaque. No petechiae. EYES: normal, Conjunctiva are pink and non-injected, sclera  clear  Musculoskeletal:no cyanosis of digits and no clubbing  NEURO: alert & oriented x 3 with fluent speech, no focal motor/sensory deficits  LABORATORY DATA:  I have reviewed the data as listed Results for orders placed or performed in visit on 02/05/15 (from  the past 48 hour(s))  CBC with Differential     Status: Abnormal   Collection Time: 02/05/15  2:28 PM  Result Value Ref Range   WBC 3.7 (L) 4.0 - 10.3 10e3/uL   NEUT# 2.0 1.5 - 6.5 10e3/uL   HGB 12.3 (L) 13.0 - 17.1 g/dL   HCT 36.7 (L) 38.4 - 49.9 %   Platelets 46 (L) 140 - 400 10e3/uL   MCV 91.5 79.3 - 98.0 fL   MCH 30.7 27.2 - 33.4 pg   MCHC 33.5 32.0 - 36.0 g/dL   RBC 4.01 (L) 4.20 - 5.82 10e6/uL   RDW 14.8 (H) 11.0 - 14.6 %   lymph# 1.1 0.9 - 3.3 10e3/uL   MONO# 0.5 0.1 - 0.9 10e3/uL   Eosinophils Absolute 0.1 0.0 - 0.5 10e3/uL   Basophils Absolute 0.0 0.0 - 0.1 10e3/uL   NEUT% 52.9 39.0 - 75.0 %   LYMPH% 28.4 14.0 - 49.0 %   MONO% 14.6 (H) 0.0 - 14.0 %   EOS% 3.8 0.0 - 7.0 %   BASO% 0.3 0.0 - 2.0 %    Lab Results  Component Value Date   WBC 3.7* 02/05/2015   HGB 12.3* 02/05/2015   HCT 36.7* 02/05/2015   MCV 91.5 02/05/2015   PLT 46* 02/05/2015   ASSESSMENT & PLAN:  Thrombocytopenia He has immune mediated thrombocytopenia which is somewhat refractory to high-dose steroids. I would not recommend any further treatment unless absolutely necessary.The goal would be just to keep his platelet count above 50,000. He has completed prednisone taper in February.  I have made him an appointment to come back in 1 month.  He will continue dual antiplatelet agents as long as his platelet count remained above 30,000. I explained to the patient the rationale of continuing antiplatelet agents with chronic thrombocytopenia.     NSTEMI (non-ST elevated myocardial infarction) He has significant improvement of symptoms since his last saw his cardiologist. Recent heart catheterization did not show any significant disease warranting further intervention. He is put on pain extended-release nitrates and he denies further chest pain or shortness of breath.   Psoriasis The psoriasis is stable but does not bother him. He improved while on prednisone but since then had relapsed.   Tobacco  abuse He started smoking again due to stress at home. Again, I counseled the patient to stop smoking.     All questions were answered. The patient knows to call the clinic with any problems, questions or concerns. No barriers to learning was detected.  I spent 15 minutes counseling the patient face to face. The total time spent in the appointment was 20 minutes and more than 50% was on counseling.     [facility], Anisa Leanos, MD 3/17/20163:28 PM

## 2015-02-26 ENCOUNTER — Ambulatory Visit (INDEPENDENT_AMBULATORY_CARE_PROVIDER_SITE_OTHER): Payer: Medicaid Other | Admitting: Cardiovascular Disease

## 2015-02-26 ENCOUNTER — Encounter: Payer: Self-pay | Admitting: Cardiovascular Disease

## 2015-02-26 VITALS — BP 136/68 | HR 76 | Ht 75.0 in | Wt 311.1 lb

## 2015-02-26 DIAGNOSIS — I480 Paroxysmal atrial fibrillation: Secondary | ICD-10-CM

## 2015-02-26 DIAGNOSIS — R0989 Other specified symptoms and signs involving the circulatory and respiratory systems: Secondary | ICD-10-CM

## 2015-02-26 MED ORDER — METOPROLOL SUCCINATE ER 25 MG PO TB24: 12.5000 mg | ORAL_TABLET | Freq: Every day | ORAL | Status: DC

## 2015-02-26 NOTE — Progress Notes (Signed)
Cardiology Office Note   Date:  02/27/2015   ID:  Vincent Ochoa, DOB July 20, 1956, MRN [id number]  PCP:  No primary care provider on file.  Cardiologist:  Sherren Mocha, MD    Chief Complaint  Patient presents with  . Medication Refill  . Leg Swelling     History of Present Illness: Vincent Ochoa is a 59 y.o. male who presents for follow-up of coronary artery disease.  The patient was admitted in December 2015 with symptoms of unstable angina. He was treated with bare-metal stenting of the left circumflex. He has multiple medical problems including thrombocytopenia, fatty liver, obstructive sleep apnea, morbid obesity, paroxysmal atrial fibrillation, psoriasis, and hyperlipidemia. The patient's atrial fibrillation was transient and it occurred during his hospitalization. Anticoagulation was not recommended because of thrombocytopenia with platelet count less than 50,000.  The patient was seen back in follow-up approximately one month ago and he complained of progressive chest discomfort, marked fatigue, and shortness of breath. Cardiac catheterization was performed. This demonstrated patency of his stent site and nonobstructive disease elsewhere. Medical therapy was recommended. He presents today for follow-up evaluation.  He continues to complain of marked fatigue. He does not feel well. He's gained a good bit of weight. Chest pain has resolved. Complaints include leg swelling, cough, chest pressure. The patient's shortness of breath has improved since he quit smoking. He denies orthopnea or PND.  Past Medical History  Diagnosis Date  . GERD (gastroesophageal reflux disease)   . Psoriasis   . Frequent headaches   . Facial cellulitis 2014    Periorbital  . Coronary artery disease     a. Evaluated by Dr. Stanford Breed 2012 - nonobstructive 2007-2008 by cath. b. Abnormal stress test 10/2014 but cath deferred temporarily due to thrombocytopenia. c. 10/2014: readm with AF-RVR/NSTEMI - s/p BMS  to mLCx 11/04/14 (mod dz in RCA). ;  d.  Lexiscan Myoview:  Inf and inf-lat defect with partial reversibility suggesting ischemia, EF 43%; difficult study; Intermediate Risk   . Back pain, lumbosacral 2014  . Thrombocytopenia     a. Onset unclear, but noted 2012. ? 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 s/p prednisone, IVIG.  Marland Kitchen Hyperlipidemia   . Obesity   . Elevated LFTs   . Fatty liver US - 2012  . Atrial fibrillation     a. Episode 10/2014 at time of NSTEMI, spont converted to NSR. Observation for further episodes (not on anticoag at present time due to [facility] = 1, thrombocytopenia).  . Sinus bradycardia   . OSA (obstructive sleep apnea)     severe with AHI 56/hr with successful CPAP titration to 18cm H2O    Past Surgical History  Procedure Laterality Date  . Tonsillectomy  1960's  . Cholecystectomy  2007  . Knee arthroplasty  1970's  . Facial cosmetic surgery  2014  . Cardiac catheterization    . Left heart catheterization with coronary angiogram N/A 11/04/2014    Procedure: LEFT HEART CATHETERIZATION WITH CORONARY ANGIOGRAM;  Surgeon: Jettie Booze, MD;  Location: [facility] CATH LAB;  Service: Cardiovascular;  Laterality: N/A;  . Left heart catheterization with coronary angiogram N/A 01/28/2015    Procedure: LEFT HEART CATHETERIZATION WITH CORONARY ANGIOGRAM;  Surgeon: Sherren Mocha, MD;  Location: [facility] CATH LAB;  Service: Cardiovascular;  Laterality: N/A;    Current Outpatient Prescriptions  Medication Sig Dispense Refill  . aspirin 81 MG tablet Take 1 tablet (81 mg total) by mouth daily.    Marland Kitchen  atorvastatin (LIPITOR) 10 MG tablet Take 1 tablet (10 mg total) by mouth every evening. 90 tablet 3  . clopidogrel (PLAVIX) 75 MG tablet Take 75 mg by mouth daily.    . furosemide (LASIX) 20 MG tablet Take 10 mg by mouth daily. Patient taking one half tablet (10 mg) by mouth daily.    . isosorbide mononitrate (IMDUR) 30 MG 24 hr tablet Take 1 tablet (30  mg total) by mouth daily. 30 tablet 11  . metoprolol succinate (TOPROL-XL) 25 MG 24 hr tablet Take 0.5 tablets (12.5 mg total) by mouth daily. 30 tablet 11  . Multiple Vitamin (MULTIVITAMIN WITH MINERALS) TABS tablet Take 1 tablet by mouth daily.    . nitroGLYCERIN (NITROSTAT) 0.4 MG SL tablet Place 1 tablet (0.4 mg total) under the tongue every 5 (five) minutes as needed for chest pain (up to 3 doses). 25 tablet 3  . pantoprazole (PROTONIX) 40 MG tablet Take 1 tablet (40 mg total) by mouth daily. 30 tablet 1   No current facility-administered medications for this visit.    Allergies:   Review of patient's allergies indicates no known allergies.   Social History:  The patient  reports that he quit smoking about 3 months ago. His smoking use included Cigarettes. He has a 64.5 pack-year smoking history. He has never used smokeless tobacco. He reports that he drinks alcohol. He reports that he does not use illicit drugs.   Family History:  The patient's family history includes Arthritis in his maternal grandmother; Cancer - Ovarian in his sister; Cirrhosis in his mother; Diabetes in his sister; Heart attack in his maternal aunt and mother; Heart disease in his sister and another family member; Hypertension in his father and mother; Stroke in his father.    ROS:  Please see the history of present illness.  Otherwise, review of systems is positive for leg swelling, cough, depression, back pain, chest pressure, leg pain, heart palpitations, wheezing, joint swelling.  All other systems are reviewed and negative.    PHYSICAL EXAM: VS:  BP 136/68 mmHg  Pulse 76  Ht 6\' 3"  (1.905 m)  Wt 311 lb 1.9 oz (141.123 kg)  BMI 38.89 kg/m2  SpO2 96% , BMI Body mass index is 38.89 kg/(m^2). GEN: Pleasant obese male, in no acute distress HEENT: normal Neck: no JVD, no masses. There is a right carotid bruit Cardiac: RRR without murmur or gallop                Respiratory:  clear to auscultation bilaterally,  normal work of breathing GI: soft, nontender, nondistended, + BS MS: no deformity or atrophy Ext: 1+ pretibial edema, pedal pulses 2+= bilaterally Skin: warm and dry, no rash Neuro:  Strength and sensation are intact Psych: euthymic mood, full affect  EKG:  EKG is not ordered today.  Recent Labs: 10/28/2014: Magnesium 1.7; TSH 2.210 11/03/2014: ALT 64* 01/09/2015: Pro B Natriuretic peptide (BNP) 43.0 01/23/2015: BUN 8; Creatinine 0.74; Potassium 3.7; Sodium 138 02/05/2015: Hemoglobin 12.3*; Platelets 46*   Lipid Panel     Component Value Date/Time   CHOL 174 10/29/2014 0455   TRIG 174* 10/29/2014 0455   HDL 24* 10/29/2014 0455   CHOLHDL 7.3 10/29/2014 0455   VLDL 35 10/29/2014 0455   LDLCALC 115* 10/29/2014 0455    Wt Readings from Last 3 Encounters:  02/26/15 311 lb 1.9 oz (141.123 kg)  02/05/15 299 lb 4.8 oz (135.762 kg)  01/28/15 298 lb (135.172 kg)     Cardiac Studies  Reviewed: Cardiac catheterization 01/28/2015: Final Conclusions:  1. Mild nonobstructive coronary artery disease with 40-50% stenosis of the right coronary artery, minor LAD stenosis, and continued patency of the stented segment the left circumflex with mild in-stent restenosis.  2. Normal LV systolic function with normal LVEDP. Recommendations: Continue current medical therapy.  ASSESSMENT AND PLAN: 1.  CAD, native vessel: The patient underwent recent cardiac catheterization demonstrating no significant coronary obstruction. Medical therapy is recommended. He is tolerating dual antiplatelet therapy without bleeding complications.  2. Hyperlipidemia: Treated with atorvastatin 10 mg daily. Will arrange follow-up lipids.  3. Carotid bruit: Will check carotid duplex scan. No stroke or TIA symptoms.  4. Fatigue: Suspect this is multifactorial. Reviewed at length with the patient today. Discussed the importance of weight loss. Will decrease metoprolol succinate 12.5 mg daily.  Current medicines are reviewed  with the patient today.  The patient does not have concerns regarding medicines.  Labs/ tests ordered today include:   Orders Placed This Encounter  Procedures  . Carotid duplex    Disposition:   FU 6 months  Signed, Sherren Mocha, MD  02/27/2015 3:55 AM    [facility] [place], [place], [place]  [address] Phone: [phone]; Fax: [phone]

## 2015-02-26 NOTE — Patient Instructions (Signed)
Your physician has recommended you make the following change in your medication:  1. DECREASE Metoprolol Succinate to 25mg  take one-half tablet by mouth daily  Your physician has requested that you have a carotid duplex. This test is an ultrasound of the carotid arteries in your neck. It looks at blood flow through these arteries that supply the brain with blood. Allow one hour for this exam. There are no restrictions or special instructions.  Your physician wants you to follow-up in: 6 MONTHS with Dr Burt Knack.  You will receive a reminder letter in the mail two months in advance. If you don't receive a letter, please call our office to schedule the follow-up appointment.

## 2015-03-02 ENCOUNTER — Ambulatory Visit (HOSPITAL_COMMUNITY): Payer: Medicaid Other | Attending: Cardiovascular Disease

## 2015-03-02 DIAGNOSIS — R0989 Other specified symptoms and signs involving the circulatory and respiratory systems: Secondary | ICD-10-CM

## 2015-03-02 NOTE — Progress Notes (Signed)
Carotid duplex scan performed 

## 2015-03-04 ENCOUNTER — Telehealth: Payer: Self-pay | Admitting: *Deleted

## 2015-03-04 ENCOUNTER — Encounter: Payer: Self-pay | Admitting: *Deleted

## 2015-03-04 NOTE — Telephone Encounter (Signed)
Pre-Visit Call completed with patient and chart updated.   Pre-Visit Info documented in Specialty Comments under SnapShot.    

## 2015-03-05 ENCOUNTER — Encounter: Payer: Self-pay | Admitting: Medical

## 2015-03-05 ENCOUNTER — Ambulatory Visit (HOSPITAL_BASED_OUTPATIENT_CLINIC_OR_DEPARTMENT_OTHER)
Admission: RE | Admit: 2015-03-05 | Discharge: 2015-03-05 | Disposition: A | Discharge status: Home / Self Care | Payer: Medicaid Other | Source: Ambulatory Visit | Attending: Medical | Admitting: Medical

## 2015-03-05 ENCOUNTER — Ambulatory Visit (INDEPENDENT_AMBULATORY_CARE_PROVIDER_SITE_OTHER): Payer: Medicaid Other | Admitting: Medical

## 2015-03-05 VITALS — BP 146/80 | HR 77 | Temp 98.3°F | Ht 75.0 in | Wt 306.2 lb

## 2015-03-05 DIAGNOSIS — I25119 Atherosclerotic heart disease of native coronary artery with unspecified angina pectoris: Secondary | ICD-10-CM

## 2015-03-05 DIAGNOSIS — I480 Paroxysmal atrial fibrillation: Secondary | ICD-10-CM | POA: Diagnosis not present

## 2015-03-05 DIAGNOSIS — M179 Osteoarthritis of knee, unspecified: Secondary | ICD-10-CM | POA: Diagnosis not present

## 2015-03-05 DIAGNOSIS — M25561 Pain in right knee: Secondary | ICD-10-CM | POA: Insufficient documentation

## 2015-03-05 DIAGNOSIS — M25562 Pain in left knee: Secondary | ICD-10-CM

## 2015-03-05 DIAGNOSIS — D696 Thrombocytopenia, unspecified: Secondary | ICD-10-CM | POA: Diagnosis not present

## 2015-03-05 DIAGNOSIS — K219 Gastro-esophageal reflux disease without esophagitis: Secondary | ICD-10-CM

## 2015-03-05 DIAGNOSIS — G8929 Other chronic pain: Secondary | ICD-10-CM | POA: Diagnosis not present

## 2015-03-05 DIAGNOSIS — M545 Low back pain: Secondary | ICD-10-CM | POA: Diagnosis present

## 2015-03-05 DIAGNOSIS — M549 Dorsalgia, unspecified: Secondary | ICD-10-CM | POA: Insufficient documentation

## 2015-03-05 DIAGNOSIS — E785 Hyperlipidemia, unspecified: Secondary | ICD-10-CM

## 2015-03-05 MED ORDER — DICLOFENAC SODIUM 75 MG PO TBEC: 75.0000 mg | DELAYED_RELEASE_TABLET | Freq: Two times a day (BID) | ORAL | Status: DC

## 2015-03-05 MED ORDER — TRAMADOL HCL 50 MG PO TABS: 50.0000 mg | ORAL_TABLET | Freq: Three times a day (TID) | ORAL | Status: DC | PRN

## 2015-03-05 NOTE — Assessment & Plan Note (Signed)
Pt states recent hematolologist([place]) for thrombocytopenia. Recent improvement.

## 2015-03-05 NOTE — Progress Notes (Signed)
Pre visit review using our clinic review tool, if applicable. No additional management support is needed unless otherwise documented below in the visit note. 

## 2015-03-05 NOTE — Assessment & Plan Note (Signed)
I don't see recent. If on follow up not in chart then will need to get lipid panel. Ask pt to come in fasting on next visit.

## 2015-03-05 NOTE — Assessment & Plan Note (Signed)
Rx diflofenac and tramadol. Get lumbar xray.

## 2015-03-05 NOTE — Assessment & Plan Note (Signed)
Pt rate is controlled. He is on toprol xl.

## 2015-03-05 NOTE — Assessment & Plan Note (Signed)
CAD- pt had cardiac cath done  In April with Dr. Burt Knack. Stents placed Nov 06, 2015. Imdur use  daily. Pt is on plavix.

## 2015-03-05 NOTE — Assessment & Plan Note (Signed)
Controlled on protonix.   
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
## 2015-03-05 NOTE — Patient Instructions (Signed)
Paroxysmal atrial fibrillation Pt rate is controlled. He is on toprol xl.   CAD (coronary artery disease) CAD- pt had cardiac cath done  In April with Dr. Burt Knack. Stents placed Nov 06, 2015. Imdur use  daily. Pt is on plavix.   Thrombocytopenia Pt states recent hematolologist([place]) for thrombocytopenia. Recent improvement.   Hyperlipidemia LDL goal <70 I don't see recent. If on follow up not in chart then will need to get lipid panel. Ask pt to come in fasting on next visit.   GERD (gastroesophageal reflux disease) Controlled on protonix.   Back pain Rx diflofenac and tramadol. Get lumbar xray.   Knee pain, bilateral Rt side worse than left. Get xray today. Tramadol and diclofenac.    Follow up in 3 wks or as needed.

## 2015-03-05 NOTE — Assessment & Plan Note (Signed)
Rt side worse than left. Get xray today. Tramadol and diclofenac.

## 2015-03-06 ENCOUNTER — Emergency Department (HOSPITAL_BASED_OUTPATIENT_CLINIC_OR_DEPARTMENT_OTHER)
Admission: EM | Admit: 2015-03-06 | Discharge: 2015-03-06 | Disposition: A | Discharge status: Home / Self Care | Payer: Medicaid Other | Attending: Emergency Medicine | Admitting: Emergency Medicine

## 2015-03-06 ENCOUNTER — Emergency Department (HOSPITAL_BASED_OUTPATIENT_CLINIC_OR_DEPARTMENT_OTHER): Payer: Medicaid Other

## 2015-03-06 ENCOUNTER — Encounter (HOSPITAL_BASED_OUTPATIENT_CLINIC_OR_DEPARTMENT_OTHER): Payer: Self-pay | Admitting: *Deleted

## 2015-03-06 DIAGNOSIS — L03116 Cellulitis of left lower limb: Secondary | ICD-10-CM

## 2015-03-06 DIAGNOSIS — E669 Obesity, unspecified: Secondary | ICD-10-CM | POA: Insufficient documentation

## 2015-03-06 DIAGNOSIS — Z9889 Other specified postprocedural states: Secondary | ICD-10-CM | POA: Diagnosis not present

## 2015-03-06 DIAGNOSIS — I2510 Atherosclerotic heart disease of native coronary artery without angina pectoris: Secondary | ICD-10-CM | POA: Diagnosis not present

## 2015-03-06 DIAGNOSIS — K219 Gastro-esophageal reflux disease without esophagitis: Secondary | ICD-10-CM | POA: Insufficient documentation

## 2015-03-06 DIAGNOSIS — Z7902 Long term (current) use of antithrombotics/antiplatelets: Secondary | ICD-10-CM | POA: Insufficient documentation

## 2015-03-06 DIAGNOSIS — Z79899 Other long term (current) drug therapy: Secondary | ICD-10-CM | POA: Diagnosis not present

## 2015-03-06 DIAGNOSIS — Z7982 Long term (current) use of aspirin: Secondary | ICD-10-CM | POA: Diagnosis not present

## 2015-03-06 DIAGNOSIS — Z862 Personal history of diseases of the blood and blood-forming organs and certain disorders involving the immune mechanism: Secondary | ICD-10-CM | POA: Diagnosis not present

## 2015-03-06 DIAGNOSIS — E785 Hyperlipidemia, unspecified: Secondary | ICD-10-CM | POA: Insufficient documentation

## 2015-03-06 DIAGNOSIS — G4733 Obstructive sleep apnea (adult) (pediatric): Secondary | ICD-10-CM | POA: Diagnosis not present

## 2015-03-06 DIAGNOSIS — Z720 Tobacco use: Secondary | ICD-10-CM | POA: Insufficient documentation

## 2015-03-06 DIAGNOSIS — I4891 Unspecified atrial fibrillation: Secondary | ICD-10-CM | POA: Insufficient documentation

## 2015-03-06 DIAGNOSIS — Z791 Long term (current) use of non-steroidal anti-inflammatories (NSAID): Secondary | ICD-10-CM | POA: Diagnosis not present

## 2015-03-06 DIAGNOSIS — R52 Pain, unspecified: Secondary | ICD-10-CM

## 2015-03-06 DIAGNOSIS — M79605 Pain in left leg: Secondary | ICD-10-CM | POA: Diagnosis present

## 2015-03-06 DIAGNOSIS — Z9981 Dependence on supplemental oxygen: Secondary | ICD-10-CM | POA: Insufficient documentation

## 2015-03-06 LAB — CBC WITH DIFFERENTIAL/PLATELET
BASOS PCT: 1 % (ref 0–1)
Basophils Absolute: 0 10*3/uL (ref 0.0–0.1)
EOS ABS: 0.1 10*3/uL (ref 0.0–0.7)
EOS PCT: 2 % (ref 0–5)
HCT: 34 % — ABNORMAL LOW (ref 39.0–52.0)
Hemoglobin: 11.3 g/dL — ABNORMAL LOW (ref 13.0–17.0)
Lymphocytes Relative: 18 % (ref 12–46)
Lymphs Abs: 0.8 10*3/uL (ref 0.7–4.0)
MCH: 30.3 pg (ref 26.0–34.0)
MCHC: 33.2 g/dL (ref 30.0–36.0)
MCV: 91.2 fL (ref 78.0–100.0)
MONO ABS: 0.6 10*3/uL (ref 0.1–1.0)
Monocytes Relative: 15 % — ABNORMAL HIGH (ref 3–12)
Neutro Abs: 2.8 10*3/uL (ref 1.7–7.7)
Neutrophils Relative %: 64 % (ref 43–77)
PLATELETS: 47 10*3/uL — AB (ref 150–400)
RBC: 3.73 MIL/uL — ABNORMAL LOW (ref 4.22–5.81)
RDW: 15.4 % (ref 11.5–15.5)
WBC: 4.3 10*3/uL (ref 4.0–10.5)

## 2015-03-06 LAB — BASIC METABOLIC PANEL
ANION GAP: 6 (ref 5–15)
BUN: 10 mg/dL (ref 6–23)
CALCIUM: 8.7 mg/dL (ref 8.4–10.5)
CHLORIDE: 106 mmol/L (ref 96–112)
CO2: 25 mmol/L (ref 19–32)
CREATININE: 0.69 mg/dL (ref 0.50–1.35)
GFR calc non Af Amer: >90 mL/min (ref >90)
Glucose, Bld: 91 mg/dL (ref 70–99)
POTASSIUM: 3.8 mmol/L (ref 3.5–5.1)
Sodium: 137 mmol/L (ref 135–145)

## 2015-03-06 MED ORDER — SODIUM CHLORIDE 0.9 % IV BOLUS (SEPSIS)
1000.0000 mL | Freq: Once | INTRAVENOUS | Status: AC
  Administered 2015-03-06: 1000 mL via INTRAVENOUS

## 2015-03-06 MED ORDER — ONDANSETRON HCL 4 MG/2ML IJ SOLN
4.0000 mg | Freq: Once | INTRAMUSCULAR | Status: AC
  Administered 2015-03-06: 4 mg via INTRAVENOUS
  Filled 2015-03-06: qty 2

## 2015-03-06 MED ORDER — CLINDAMYCIN PHOSPHATE 900 MG/50ML IV SOLN
900.0000 mg | Freq: Once | INTRAVENOUS | Status: AC
  Administered 2015-03-06: 900 mg via INTRAVENOUS
  Filled 2015-03-06: qty 50

## 2015-03-06 MED ORDER — HYDROMORPHONE HCL 1 MG/ML IJ SOLN
1.0000 mg | Freq: Once | INTRAMUSCULAR | Status: AC
  Administered 2015-03-06: 1 mg via INTRAVENOUS
  Filled 2015-03-06: qty 1

## 2015-03-06 MED ORDER — SODIUM CHLORIDE 0.9 % IV BOLUS (SEPSIS): 1000.0000 mL | Freq: Once | INTRAVENOUS | Status: DC

## 2015-03-06 MED ORDER — CLINDAMYCIN HCL 150 MG PO CAPS: 300.0000 mg | ORAL_CAPSULE | Freq: Three times a day (TID) | ORAL | Status: DC

## 2015-03-06 MED ORDER — MORPHINE SULFATE 4 MG/ML IJ SOLN
4.0000 mg | Freq: Once | INTRAMUSCULAR | Status: AC
  Administered 2015-03-06: 4 mg via INTRAVENOUS
  Filled 2015-03-06: qty 1

## 2015-03-06 NOTE — ED Notes (Signed)
PO fluids provided. 

## 2015-03-06 NOTE — ED Provider Notes (Signed)
CSN: [id number]     Arrival date & time 03/06/15  1351 History   First MD Initiated Contact with Patient 03/06/15 1403     Chief Complaint  Patient presents with  . Leg Pain     (Consider location/radiation/quality/duration/timing/severity/associated sxs/prior Treatment) HPI Comments: Patient is a 59 year old male with past medical history of CAD with previous NSTEMI, obesity, GERD, and paroxysmal atrial fibrillation who presents with left lower leg pain that started this morning. The pain is aching and severe and does not radiate. Patient denies any injury to the area. He reports being seen at his PCP office yesterday for knee pain and had unremarkable xrays. Patient reports some associated redness to the area of pain. No alleviating factors. No fevers. No other associated symptoms.    Past Medical History  Diagnosis Date  . GERD (gastroesophageal reflux disease)   . Psoriasis   . Frequent headaches   . Facial cellulitis 2014    Periorbital  . Coronary artery disease     a. Evaluated by Dr. Stanford Breed 2012 - nonobstructive 2007-2008 by cath. b. Abnormal stress test 10/2014 but cath deferred temporarily due to thrombocytopenia. c. 10/2014: readm with AF-RVR/NSTEMI - s/p BMS to mLCx 11/04/14 (mod dz in RCA). ;  d.  Lexiscan Myoview:  Inf and inf-lat defect with partial reversibility suggesting ischemia, EF 43%; difficult study; Intermediate Risk   . Back pain, lumbosacral 2014  . Thrombocytopenia     a. Onset unclear, but noted 2012. ? 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 s/p prednisone, IVIG.  Marland Kitchen Hyperlipidemia   . Obesity   . Elevated LFTs   . Fatty liver US - 2012  . Atrial fibrillation     a. Episode 10/2014 at time of NSTEMI, spont converted to NSR. Observation for further episodes (not on anticoag at present time due to [facility] = 1, thrombocytopenia).  . Sinus bradycardia   . OSA (obstructive sleep apnea)     severe with AHI 56/hr with  successful CPAP titration to 18cm H2O   Past Surgical History  Procedure Laterality Date  . Tonsillectomy  1960's  . Cholecystectomy  2007  . Knee arthroplasty  1970's  . Facial cosmetic surgery  2014  . Cardiac catheterization    . Left heart catheterization with coronary angiogram N/A 11/04/2014    Procedure: LEFT HEART CATHETERIZATION WITH CORONARY ANGIOGRAM;  Surgeon: Jettie Booze, MD;  Location: [facility] CATH LAB;  Service: Cardiovascular;  Laterality: N/A;  . Left heart catheterization with coronary angiogram N/A 01/28/2015    Procedure: LEFT HEART CATHETERIZATION WITH CORONARY ANGIOGRAM;  Surgeon: Sherren Mocha, MD;  Location: [facility] CATH LAB;  Service: Cardiovascular;  Laterality: N/A;   Family History  Problem Relation Age of Onset  . Arthritis Maternal Grandmother   . Heart disease Sister   . Diabetes Sister   . Cancer - Ovarian Sister   . Heart disease      Maternal/paternal grandparents  . Cirrhosis Mother   . Heart attack Mother   . Heart attack Maternal Aunt   . Hypertension Mother   . Hypertension Father   . Stroke Father    History  Substance Use Topics  . Smoking status: Current Some Day Smoker -- 1.50 packs/day for 43 years    Types: Cigarettes    Last Attempt to Quit: 10/30/2014  . Smokeless tobacco: Never Used     Comment: 11/28/2012 "chew occasionally"  . Alcohol Use: 0.0 oz/week    0  Standard drinks or equivalent per week     Comment: 11/28/2012 "beer q 6 months or so"    Review of Systems  Musculoskeletal: Positive for myalgias.  Skin: Positive for color change.  All other systems reviewed and are negative.     Allergies  Review of patient's allergies indicates no known allergies.  Home Medications   Prior to Admission medications   Medication Sig Start Date End Date Taking? Authorizing Provider  aspirin 81 MG tablet Take 1 tablet (81 mg total) by mouth daily. 10/31/14   Dayna N Dunn, PA-C  atorvastatin (LIPITOR) 10 MG tablet Take 1 tablet (10  mg total) by mouth every evening. 12/16/14   Sherren Mocha, MD  clopidogrel (PLAVIX) 75 MG tablet Take 75 mg by mouth daily.    Historical Provider, MD  diclofenac (VOLTAREN) 75 MG EC tablet Take 1 tablet (75 mg total) by mouth 2 (two) times daily. 03/05/15   Meriam Sprague Saguier, PA-C  furosemide (LASIX) 20 MG tablet Take 10 mg by mouth daily. Patient taking one half tablet (10 mg) by mouth daily.    Historical Provider, MD  isosorbide mononitrate (IMDUR) 30 MG 24 hr tablet Take 1 tablet (30 mg total) by mouth daily. 01/23/15   Sherren Mocha, MD  metoprolol succinate (TOPROL-XL) 25 MG 24 hr tablet Take 0.5 tablets (12.5 mg total) by mouth daily. 02/26/15   Sherren Mocha, MD  Multiple Vitamin (MULTIVITAMIN WITH MINERALS) TABS tablet Take 1 tablet by mouth daily.    Historical Provider, MD  nitroGLYCERIN (NITROSTAT) 0.4 MG SL tablet Place 1 tablet (0.4 mg total) under the tongue every 5 (five) minutes as needed for chest pain (up to 3 doses). 10/31/14   Dayna N Dunn, PA-C  pantoprazole (PROTONIX) 40 MG tablet Take 1 tablet (40 mg total) by mouth daily. 10/31/14   Dayna N Dunn, PA-C  traMADol (ULTRAM) 50 MG tablet Take 1 tablet (50 mg total) by mouth every 8 (eight) hours as needed. 03/05/15   Meriam Sprague Saguier, PA-C   BP 117/58 mmHg  Pulse 81  Temp(Src) 99 F (37.2 C)  Resp 16  Ht 6\' 3"  (1.905 m)  Wt 311 lb (141.069 kg)  BMI 38.87 kg/m2  SpO2 96% Physical Exam  Constitutional: He appears well-developed and well-nourished. No distress.  HENT:  Head: Normocephalic and atraumatic.  Eyes: Conjunctivae and EOM are normal.  Neck: Normal range of motion.  Cardiovascular: Normal rate and regular rhythm.  Exam reveals no gallop and no friction rub.   No murmur heard. Pulmonary/Chest: Effort normal and breath sounds normal. He has no wheezes. He has no rales. He exhibits no tenderness.  Abdominal: Soft. There is no tenderness.  Musculoskeletal:  Limited weight bearing activity and ROM of left ankle due to  pain. No obvious deformity. No signs of cellulitis overlying joints.   Neurological: He is alert.  Speech is goal-oriented. Moves limbs without ataxia.   Skin: Skin is warm and dry.  5x5cm area of erythema to anterior left lower leg with tenderness to palpation. No open wound. The skin is warm to touch down to the left ankle.   Psychiatric: He has a normal mood and affect. His behavior is normal.  Nursing note and vitals reviewed.   ED Course  Procedures (including critical care time) Labs Review Labs Reviewed  CBC WITH DIFFERENTIAL/PLATELET - Abnormal; Notable for the following:    RBC 3.73 (*)    Hemoglobin 11.3 (*)    HCT 34.0 (*)  Platelets 47 (*)    Monocytes Relative 15 (*)    All other components within normal limits  BASIC METABOLIC PANEL    Imaging Review Dg Lumbar Spine Complete  03/05/2015   CLINICAL DATA:  Chronic low back pain.  No recent injury.  EXAM: LUMBAR SPINE - COMPLETE 4+ VIEW  COMPARISON:  Plain films lumbar spine 02/11/2013 and 01/24/2013.  FINDINGS: Scattered Schmorl's nodes are noted. Mild vertebral body height loss is seen at T12, L1 and L2 and compatible with remote fractures and/or Schmorl's nodes. The appearance is unchanged. Intervertebral disc space height is normal. No pars interarticularis defect is identified. The sacroiliac joints appear normal. Cholecystectomy clips are noted.  IMPRESSION: No acute abnormality.  Stable compared to prior exams.   Electronically Signed   By: Inge Rise M.D.   On: 03/05/2015 15:08   Dg Knee 1-2 Views Left  03/05/2015   CLINICAL DATA:  Chronic knee pain.  EXAM: LEFT KNEE - 1-2 VIEW  COMPARISON:  None.  FINDINGS: There are small marginal osteophytes in all 3 compartments of the left knee. No joint space narrowing or joint effusion.  IMPRESSION: Slight tricompartmental degenerative arthritis.   Electronically Signed   By: Lorriane Shire M.D.   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  FINDINGS: No fracture or dislocation.  Mild arthropathic changes are noted reflected by small marginal osteophytes from all 3 compartments. In the joint space is relatively well maintained. There is no joint effusion.  Soft tissues are unremarkable.  IMPRESSION: 1. No fracture or acute finding. 2. Mild degenerative change.   Electronically Signed   By: Lajean Manes M.D.   On: 03/05/2015 15:07     EKG Interpretation None      MDM   Final diagnoses:  Pain  Cellulitis of left anterior lower leg    2:25 PM Patient has cellulitis of left anterior lower leg. Vitals stable and patient afebrile. Labs pending. Patient will have IV pain medication and antibiotics.   6:11 PM Labs unremarkable for acute changes. Patient's pain improved. Patient is afebrile at this time. Patient's cellulitic area marked and patient instructed to return with worsening or concerning symptoms. Patient will have Clindamycin. No pain medication prescribed due to pain contract.   Alvina Chou, PA-C 03/06/15 1812  Tanna Furry, MD 03/12/15 1736

## 2015-03-06 NOTE — ED Notes (Signed)
Urinal provided.

## 2015-03-06 NOTE — ED Notes (Signed)
Pt c/o left lower leg pain seen by PMD yesterday for same , given tramadol w/o relief , pt has controlled substance contract with PMD

## 2015-03-06 NOTE — Discharge Instructions (Signed)
Take clindamycin as directed until gone. Refer to attached documents for more information. Return to the ED with worsening or concerning symptoms.

## 2015-03-09 ENCOUNTER — Emergency Department (HOSPITAL_BASED_OUTPATIENT_CLINIC_OR_DEPARTMENT_OTHER): Payer: Medicaid Other

## 2015-03-09 ENCOUNTER — Telehealth: Payer: Self-pay | Admitting: *Deleted

## 2015-03-09 ENCOUNTER — Telehealth: Payer: Self-pay | Admitting: Medical

## 2015-03-09 ENCOUNTER — Emergency Department (HOSPITAL_BASED_OUTPATIENT_CLINIC_OR_DEPARTMENT_OTHER)
Admission: EM | Admit: 2015-03-09 | Discharge: 2015-03-10 | Disposition: A | Discharge status: Home / Self Care | Payer: Medicaid Other | Attending: Emergency Medicine | Admitting: Emergency Medicine

## 2015-03-09 ENCOUNTER — Encounter (HOSPITAL_BASED_OUTPATIENT_CLINIC_OR_DEPARTMENT_OTHER): Payer: Self-pay | Admitting: *Deleted

## 2015-03-09 DIAGNOSIS — Z862 Personal history of diseases of the blood and blood-forming organs and certain disorders involving the immune mechanism: Secondary | ICD-10-CM | POA: Diagnosis not present

## 2015-03-09 DIAGNOSIS — Z720 Tobacco use: Secondary | ICD-10-CM | POA: Diagnosis not present

## 2015-03-09 DIAGNOSIS — Z79899 Other long term (current) drug therapy: Secondary | ICD-10-CM | POA: Insufficient documentation

## 2015-03-09 DIAGNOSIS — G4733 Obstructive sleep apnea (adult) (pediatric): Secondary | ICD-10-CM | POA: Diagnosis not present

## 2015-03-09 DIAGNOSIS — L03119 Cellulitis of unspecified part of limb: Secondary | ICD-10-CM

## 2015-03-09 DIAGNOSIS — E669 Obesity, unspecified: Secondary | ICD-10-CM | POA: Insufficient documentation

## 2015-03-09 DIAGNOSIS — I4891 Unspecified atrial fibrillation: Secondary | ICD-10-CM | POA: Diagnosis not present

## 2015-03-09 DIAGNOSIS — K219 Gastro-esophageal reflux disease without esophagitis: Secondary | ICD-10-CM | POA: Insufficient documentation

## 2015-03-09 DIAGNOSIS — L02416 Cutaneous abscess of left lower limb: Secondary | ICD-10-CM | POA: Insufficient documentation

## 2015-03-09 DIAGNOSIS — L03116 Cellulitis of left lower limb: Secondary | ICD-10-CM | POA: Diagnosis not present

## 2015-03-09 DIAGNOSIS — I2510 Atherosclerotic heart disease of native coronary artery without angina pectoris: Secondary | ICD-10-CM | POA: Insufficient documentation

## 2015-03-09 DIAGNOSIS — Z9981 Dependence on supplemental oxygen: Secondary | ICD-10-CM | POA: Diagnosis not present

## 2015-03-09 DIAGNOSIS — Z7902 Long term (current) use of antithrombotics/antiplatelets: Secondary | ICD-10-CM | POA: Insufficient documentation

## 2015-03-09 DIAGNOSIS — Z791 Long term (current) use of non-steroidal anti-inflammatories (NSAID): Secondary | ICD-10-CM | POA: Insufficient documentation

## 2015-03-09 DIAGNOSIS — E785 Hyperlipidemia, unspecified: Secondary | ICD-10-CM | POA: Insufficient documentation

## 2015-03-09 DIAGNOSIS — Z4800 Encounter for change or removal of nonsurgical wound dressing: Secondary | ICD-10-CM | POA: Diagnosis present

## 2015-03-09 DIAGNOSIS — L02419 Cutaneous abscess of limb, unspecified: Secondary | ICD-10-CM

## 2015-03-09 DIAGNOSIS — M7989 Other specified soft tissue disorders: Secondary | ICD-10-CM

## 2015-03-09 DIAGNOSIS — Z7982 Long term (current) use of aspirin: Secondary | ICD-10-CM | POA: Insufficient documentation

## 2015-03-09 MED ORDER — OXYCODONE-ACETAMINOPHEN 5-325 MG PO TABS
2.0000 | ORAL_TABLET | Freq: Once | ORAL | Status: AC
  Administered 2015-03-09: 2 via ORAL
  Filled 2015-03-09: qty 2

## 2015-03-09 MED ORDER — LIDOCAINE HCL (PF) 1 % IJ SOLN
INTRAMUSCULAR | Status: AC
  Administered 2015-03-09: 2.2 mL
  Filled 2015-03-09: qty 5

## 2015-03-09 MED ORDER — CEFTRIAXONE SODIUM 1 G IJ SOLR
1.0000 g | Freq: Once | INTRAMUSCULAR | Status: AC
  Administered 2015-03-09: 1 g via INTRAMUSCULAR
  Filled 2015-03-09: qty 10

## 2015-03-09 NOTE — ED Provider Notes (Signed)
CSN: [id number]     Arrival date & time 03/09/15  2029 History  This chart was scribed for Vincent Azad, MD by Tula Nakayama, ED Scribe. This patient was seen in room MHT13/MHT13 and the patient's care was started at 11:01 PM.    Chief Complaint  Patient presents with  . Follow-up   Patient is a 59 y.o. male presenting with leg pain. The history is provided by the patient. No language interpreter was used.  Leg Pain Location:  Leg (shin left) Time since incident:  3 days Injury: no   Leg location:  L lower leg Pain details:    Quality:  Unable to specify   Radiates to:  Does not radiate   Severity:  Moderate   Onset quality:  Gradual   Duration:  3 days   Timing:  Constant   Progression:  Worsening Chronicity:  New Dislocation: no   Foreign body present:  No foreign bodies Relieved by:  Nothing Worsened by:  Nothing tried Ineffective treatments: ultram. Associated symptoms: swelling   Associated symptoms: no fever   Risk factors: no concern for non-accidental trauma     HPI Comments: Vincent Ochoa is a 59 y.o. male with a history of CAD who presents to the Emergency Department complaining of constant, moderate redness of his left anterior LE that started 3 days ago. Pt was seen in the ED on 4/15 for the same. Upon discharge, the provider drew a line on the area and cautioned the pt to return if erythema extended past the markings. Started meds Saturday evening.  Pt has tried Clindamycin TID. He denies fever.  States he called his PMD but they could not see him before Thursday.    Past Medical History  Diagnosis Date  . GERD (gastroesophageal reflux disease)   . Psoriasis   . Frequent headaches   . Facial cellulitis 2014    Periorbital  . Coronary artery disease     a. Evaluated by Dr. Stanford Breed 2012 - nonobstructive 2007-2008 by cath. b. Abnormal stress test 10/2014 but cath deferred temporarily due to thrombocytopenia. c. 10/2014: readm with AF-RVR/NSTEMI - s/p BMS to  mLCx 11/04/14 (mod dz in RCA). ;  d.  Lexiscan Myoview:  Inf and inf-lat defect with partial reversibility suggesting ischemia, EF 43%; difficult study; Intermediate Risk   . Back pain, lumbosacral 2014  . Thrombocytopenia     a. Onset unclear, but noted 2012. ? 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 s/p prednisone, IVIG.  Marland Kitchen Hyperlipidemia   . Obesity   . Elevated LFTs   . Fatty liver US - 2012  . Atrial fibrillation     a. Episode 10/2014 at time of NSTEMI, spont converted to NSR. Observation for further episodes (not on anticoag at present time due to [facility] = 1, thrombocytopenia).  . Sinus bradycardia   . OSA (obstructive sleep apnea)     severe with AHI 56/hr with successful CPAP titration to 18cm H2O   Past Surgical History  Procedure Laterality Date  . Tonsillectomy  1960's  . Cholecystectomy  2007  . Knee arthroplasty  1970's  . Facial cosmetic surgery  2014  . Cardiac catheterization    . Left heart catheterization with coronary angiogram N/A 11/04/2014    Procedure: LEFT HEART CATHETERIZATION WITH CORONARY ANGIOGRAM;  Surgeon: Jettie Booze, MD;  Location: [facility] CATH LAB;  Service: Cardiovascular;  Laterality: N/A;  . Left heart catheterization with coronary angiogram N/A 01/28/2015  Procedure: LEFT HEART CATHETERIZATION WITH CORONARY ANGIOGRAM;  Surgeon: Sherren Mocha, MD;  Location: [facility] CATH LAB;  Service: Cardiovascular;  Laterality: N/A;   Family History  Problem Relation Age of Onset  . Arthritis Maternal Grandmother   . Heart disease Sister   . Diabetes Sister   . Cancer - Ovarian Sister   . Heart disease      Maternal/paternal grandparents  . Cirrhosis Mother   . Heart attack Mother   . Heart attack Maternal Aunt   . Hypertension Mother   . Hypertension Father   . Stroke Father    History  Substance Use Topics  . Smoking status: Current Some Day Smoker -- 1.50 packs/day for 43 years    Types: Cigarettes    Last  Attempt to Quit: 10/30/2014  . Smokeless tobacco: Never Used     Comment: 11/28/2012 "chew occasionally"  . Alcohol Use: 0.0 oz/week    0 Standard drinks or equivalent per week     Comment: 11/28/2012 "beer q 6 months or so"    Review of Systems  Constitutional: Negative for fever.  Gastrointestinal: Negative for vomiting and diarrhea.  Skin: Positive for color change.  All other systems reviewed and are negative.   Allergies  Review of patient's allergies indicates no known allergies.  Home Medications   Prior to Admission medications   Medication Sig Start Date End Date Taking? Authorizing Provider  aspirin 81 MG tablet Take 1 tablet (81 mg total) by mouth daily. 10/31/14   Dayna N Dunn, PA-C  atorvastatin (LIPITOR) 10 MG tablet Take 1 tablet (10 mg total) by mouth every evening. 12/16/14   Sherren Mocha, MD  clindamycin (CLEOCIN) 150 MG capsule Take 2 capsules (300 mg total) by mouth 3 (three) times daily. May dispense as 150mg  capsules 03/06/15   Alvina Chou, PA-C  clopidogrel (PLAVIX) 75 MG tablet Take 75 mg by mouth daily.    Historical Provider, MD  diclofenac (VOLTAREN) 75 MG EC tablet Take 1 tablet (75 mg total) by mouth 2 (two) times daily. 03/05/15   Meriam Sprague Saguier, PA-C  furosemide (LASIX) 20 MG tablet Take 10 mg by mouth daily. Patient taking one half tablet (10 mg) by mouth daily.    Historical Provider, MD  isosorbide mononitrate (IMDUR) 30 MG 24 hr tablet Take 1 tablet (30 mg total) by mouth daily. 01/23/15   Sherren Mocha, MD  metoprolol succinate (TOPROL-XL) 25 MG 24 hr tablet Take 0.5 tablets (12.5 mg total) by mouth daily. 02/26/15   Sherren Mocha, MD  Multiple Vitamin (MULTIVITAMIN WITH MINERALS) TABS tablet Take 1 tablet by mouth daily.    Historical Provider, MD  nitroGLYCERIN (NITROSTAT) 0.4 MG SL tablet Place 1 tablet (0.4 mg total) under the tongue every 5 (five) minutes as needed for chest pain (up to 3 doses). 10/31/14   Dayna N Dunn, PA-C  pantoprazole  (PROTONIX) 40 MG tablet Take 1 tablet (40 mg total) by mouth daily. 10/31/14   Dayna N Dunn, PA-C  traMADol (ULTRAM) 50 MG tablet Take 1 tablet (50 mg total) by mouth every 8 (eight) hours as needed. 03/05/15   Meriam Sprague Saguier, PA-C   BP 143/69 mmHg  Pulse 78  Temp(Src) 98.8 F (37.1 C) (Oral)  Resp 16  Ht 6\' 3"  (1.905 m)  Wt 311 lb (141.069 kg)  BMI 38.87 kg/m2  SpO2 98% Physical Exam  Constitutional: He is oriented to person, place, and time. He appears well-developed and well-nourished. No distress.  Well appearing no  distress  HENT:  Head: Normocephalic and atraumatic.  Moist mucous membranes  Eyes: Conjunctivae and EOM are normal.  Neck: Normal range of motion. Neck supple. No tracheal deviation present.  Cardiovascular: Normal rate, regular rhythm and normal heart sounds.   Pulmonary/Chest: Effort normal and breath sounds normal. No respiratory distress. He has no wheezes. He has no rales.  Abdominal: Soft. Bowel sounds are normal. There is no tenderness. There is no rebound and no guarding.  Musculoskeletal: Normal range of motion.       Left ankle: Normal.       Left foot: There is normal range of motion, no bony tenderness, normal capillary refill, no crepitus, no deformity and no laceration.  Neurological: He is alert and oriented to person, place, and time. He has normal reflexes.  Skin: Skin is warm and dry. There is erythema.  area of erythema to anterior left shin 10 cm in diameteminimally outside the markings 2 cm Compartments are soft no warmth no streaking no popliteal LAN.  No fluctuances.  Left foot NVI.  Dorsalis pedis intact   Psychiatric: He has a normal mood and affect. His behavior is normal.  Nursing note and vitals reviewed.   ED Course  Procedures   DIAGNOSTIC STUDIES: Oxygen Saturation is 98% on RA, normal by my interpretation.    COORDINATION OF CARE: 11:07 PM Discussed treatment plan with pt. She agreed to plan.  Labs Review Labs Reviewed - No  data to display  Imaging Review No results found.   EKG Interpretation None      MDM   Final diagnoses:  None   Patient is afebrile and well appearing.  No streaking up the leg.  No dvt.  No fevers no drainage.  Patient did call into PMD and asked for pain medication on 4/18.  Patient has only been on 2.5 days of clindamycin and has not had much change A. It is not long enough on this medication, and B.  He is only on the medication TID and it needs to be QID.  PMD wants to see patient earlier but patient decline instructed close follow up with PMD and return for fevers vomiting or diarrhea.  Have increased Clindamycin to QID and placed on meloxicam as patient has a narcotic contract with his PMD.    I personally performed the services described in this documentation, which was scribed in my presence. The recorded information has been reviewed and is accurate.     Veatrice Kells, MD 03/10/15 [id number]

## 2015-03-09 NOTE — ED Notes (Signed)
Patient transported to Ultrasound 

## 2015-03-09 NOTE — ED Notes (Signed)
Pt seen here 2 days ago for same, here for f/u Right leg cellulitis

## 2015-03-09 NOTE — Telephone Encounter (Signed)
Patient called back regarding this.

## 2015-03-09 NOTE — Telephone Encounter (Signed)
Relation to pt: self  Call back number: [phone]   Reason for call:  Pt checking on the status of intal request

## 2015-03-09 NOTE — Telephone Encounter (Signed)
Prior authorization for diclofenac sodium initiated. Awaiting determination. JG//CMA

## 2015-03-09 NOTE — ED Notes (Signed)
MD at bedside. 

## 2015-03-09 NOTE — Telephone Encounter (Signed)
Caller name: Glennon Relation to pt: self Call back number: [phone] Pharmacy: [facility]  Reason for call:   Patient was seen in ER last Friday regarding leg pain and is requesting pain medication. Appointment scheduled for 03/12/15, offered earlier appointment, pt declined.

## 2015-03-10 ENCOUNTER — Encounter (HOSPITAL_BASED_OUTPATIENT_CLINIC_OR_DEPARTMENT_OTHER): Payer: Self-pay | Admitting: Emergency Medicine

## 2015-03-10 MED ORDER — MELOXICAM 15 MG PO TABS: 15.0000 mg | ORAL_TABLET | Freq: Every day | ORAL | Status: DC

## 2015-03-10 MED ORDER — CLINDAMYCIN HCL 150 MG PO CAPS
300.0000 mg | ORAL_CAPSULE | Freq: Once | ORAL | Status: AC
  Administered 2015-03-10: 300 mg via ORAL
  Filled 2015-03-10: qty 2

## 2015-03-10 MED ORDER — CLINDAMYCIN HCL 300 MG PO CAPS: 300.0000 mg | ORAL_CAPSULE | Freq: Four times a day (QID) | ORAL | Status: DC

## 2015-03-11 NOTE — Telephone Encounter (Signed)
Left message on patients answering machine to call to schedule appointment for today or tomorrow or asap. To discuss need for pain medications.

## 2015-03-12 ENCOUNTER — Ambulatory Visit (INDEPENDENT_AMBULATORY_CARE_PROVIDER_SITE_OTHER): Payer: Medicaid Other | Admitting: Medical

## 2015-03-12 ENCOUNTER — Ambulatory Visit (HOSPITAL_BASED_OUTPATIENT_CLINIC_OR_DEPARTMENT_OTHER): Payer: Medicaid Other | Admitting: Hematology and Oncology

## 2015-03-12 ENCOUNTER — Encounter: Payer: Self-pay | Admitting: Medical

## 2015-03-12 ENCOUNTER — Telehealth: Payer: Self-pay | Admitting: Hematology and Oncology

## 2015-03-12 ENCOUNTER — Other Ambulatory Visit (HOSPITAL_BASED_OUTPATIENT_CLINIC_OR_DEPARTMENT_OTHER): Payer: Medicaid Other

## 2015-03-12 ENCOUNTER — Encounter: Payer: Self-pay | Admitting: Hematology and Oncology

## 2015-03-12 VITALS — BP 130/77 | HR 74 | Temp 98.4°F | Ht 75.0 in | Wt 297.2 lb

## 2015-03-12 VITALS — BP 122/65 | HR 71 | Temp 98.2°F | Resp 19 | Ht 75.0 in | Wt 299.9 lb

## 2015-03-12 DIAGNOSIS — D179 Benign lipomatous neoplasm, unspecified: Secondary | ICD-10-CM

## 2015-03-12 DIAGNOSIS — R5383 Other fatigue: Secondary | ICD-10-CM | POA: Diagnosis not present

## 2015-03-12 DIAGNOSIS — I214 Non-ST elevation (NSTEMI) myocardial infarction: Secondary | ICD-10-CM

## 2015-03-12 DIAGNOSIS — R040 Epistaxis: Secondary | ICD-10-CM

## 2015-03-12 DIAGNOSIS — D696 Thrombocytopenia, unspecified: Secondary | ICD-10-CM | POA: Diagnosis not present

## 2015-03-12 DIAGNOSIS — L0390 Cellulitis, unspecified: Secondary | ICD-10-CM | POA: Insufficient documentation

## 2015-03-12 DIAGNOSIS — L409 Psoriasis, unspecified: Secondary | ICD-10-CM | POA: Diagnosis not present

## 2015-03-12 DIAGNOSIS — L03116 Cellulitis of left lower limb: Secondary | ICD-10-CM

## 2015-03-12 LAB — CBC WITH DIFFERENTIAL/PLATELET
BASO%: 0.6 % (ref 0.0–2.0)
Basophils Absolute: 0 10*3/uL (ref 0.0–0.1)
EOS%: 2.9 % (ref 0.0–7.0)
Eosinophils Absolute: 0.1 10*3/uL (ref 0.0–0.5)
HCT: 36.5 % — ABNORMAL LOW (ref 38.4–49.9)
HGB: 11.9 g/dL — ABNORMAL LOW (ref 13.0–17.1)
LYMPH%: 30.3 % (ref 14.0–49.0)
MCH: 29.7 pg (ref 27.2–33.4)
MCHC: 32.5 g/dL (ref 32.0–36.0)
MCV: 91.2 fL (ref 79.3–98.0)
MONO#: 0.5 10*3/uL (ref 0.1–0.9)
MONO%: 13.4 % (ref 0.0–14.0)
NEUT%: 52.8 % (ref 39.0–75.0)
NEUTROS ABS: 2 10*3/uL (ref 1.5–6.5)
Platelets: 66 10*3/uL — ABNORMAL LOW (ref 140–400)
RBC: 4 10*6/uL — AB (ref 4.20–5.82)
RDW: 16 % — ABNORMAL HIGH (ref 11.0–14.6)
WBC: 3.8 10*3/uL — ABNORMAL LOW (ref 4.0–10.3)
lymph#: 1.1 10*3/uL (ref 0.9–3.3)

## 2015-03-12 LAB — TSH: TSH: 1.43 u[IU]/mL (ref 0.35–4.50)

## 2015-03-12 MED ORDER — CEFTRIAXONE SODIUM 1 G IJ SOLR
1.0000 g | Freq: Once | INTRAMUSCULAR | Status: AC
  Administered 2015-03-12: 1 g via INTRAMUSCULAR

## 2015-03-12 NOTE — Assessment & Plan Note (Signed)
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 Will refer to general surgeon.

## 2015-03-12 NOTE — Patient Instructions (Signed)
Cellulitis Pt in with some persisting infection but residual. Rocephin 1 gram. Continue clindamycin.  Follow up in in 5 days or as needed.  If worsens then need to be seen asap.   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 Will refer to general surgeon.   Fatigue Will get tsh today. Cbc, bmp done other day.     Follow up in 5 days or as needed.

## 2015-03-12 NOTE — Assessment & Plan Note (Signed)
This is related to dual antiplatelet agents. I felt that the recurrent epistaxis is related to forceful sneezing and coughing. The present time, he does not need any further treatment. It is usually self-limiting.

## 2015-03-12 NOTE — Assessment & Plan Note (Addendum)
Will get tsh today. Cbc, bmp done other day.

## 2015-03-12 NOTE — Assessment & Plan Note (Signed)
He has significant improvement of symptoms since his last saw his cardiologist. Recent heart catheterization did not show any significant disease warranting further intervention. He is put on pain extended-release nitrates and he denies further chest pain or shortness of breath.

## 2015-03-12 NOTE — Assessment & Plan Note (Addendum)
Pt in with some persisting infection but residual. Rocephin 1 gram. Continue clindamycin.  Follow up in in 5 days or as needed.  If worsens then need to be seen asap.

## 2015-03-12 NOTE — Progress Notes (Signed)
Pre visit review using our clinic review tool, if applicable. No additional management support is needed unless otherwise documented below in the visit note. 

## 2015-03-12 NOTE — Assessment & Plan Note (Signed)
The psoriasis is stable but does not bother him.

## 2015-03-12 NOTE — Progress Notes (Signed)
[facility] OFFICE PROGRESS NOTE  Saguier, Percell Miller, PA-C SUMMARY OF HEMATOLOGIC HISTORY:  Vincent Ochoa 59 y.o. male with a history of cardiac disease was admitted via Emergency Department for recurrent chest pain.He was initially seen in consultation on 12/8 for thrombocytopenia, felt to be likely autoimmune (related to psoriasis and liver disease), responding well to 2 doses of IVIG in addition to prednisone and transfusions. Patient had been discharged on 12/11 home with baby aspirin. He was subsequently readmitted to the hospital again on 11/03/2014 with recurrent chest pain and subsequently underwent cardiac catheterization and placement of bare metal stent on 11/04/2014. On 12/31/2014, he discontinued prednisone INTERVAL HISTORY: Vincent Ochoa 59 y.o. male returns for further follow-up. He had intermittent chest discomfort but not severe. He had recent cellulitis in his left lower extremity, improve while on antibiotics. He bruises easily. With forceful sneezing or coughing, he has occasional epistaxis but they are usually self-limiting. The patient denies any recent signs or symptoms of bleeding such as spontaneous epistaxis, hematuria or hematochezia.   I have reviewed the past medical history, past surgical history, social history and family history with the patient and they are unchanged from previous note.  ALLERGIES:  has No Known Allergies.  MEDICATIONS:  Current Outpatient Prescriptions  Medication Sig Dispense Refill  . aspirin 81 MG tablet Take 1 tablet (81 mg total) by mouth daily.    Marland Kitchen atorvastatin (LIPITOR) 10 MG tablet Take 1 tablet (10 mg total) by mouth every evening. 90 tablet 3  . clindamycin (CLEOCIN) 300 MG capsule Take 1 capsule (300 mg total) by mouth 4 (four) times daily. X 7 days 28 capsule 0  . clopidogrel (PLAVIX) 75 MG tablet Take 75 mg by mouth daily.    . diclofenac (VOLTAREN) 75 MG EC tablet Take 1 tablet (75 mg total) by mouth 2 (two)  times daily. 30 tablet 0  . furosemide (LASIX) 20 MG tablet Take 10 mg by mouth daily. Patient taking one half tablet (10 mg) by mouth daily.    . isosorbide mononitrate (IMDUR) 30 MG 24 hr tablet Take 1 tablet (30 mg total) by mouth daily. 30 tablet 11  . meloxicam (MOBIC) 15 MG tablet Take 1 tablet (15 mg total) by mouth daily. 10 tablet 0  . metoprolol succinate (TOPROL-XL) 25 MG 24 hr tablet Take 0.5 tablets (12.5 mg total) by mouth daily. 30 tablet 11  . Multiple Vitamin (MULTIVITAMIN WITH MINERALS) TABS tablet Take 1 tablet by mouth daily.    . nitroGLYCERIN (NITROSTAT) 0.4 MG SL tablet Place 1 tablet (0.4 mg total) under the tongue every 5 (five) minutes as needed for chest pain (up to 3 doses). 25 tablet 3  . pantoprazole (PROTONIX) 40 MG tablet Take 1 tablet (40 mg total) by mouth daily. 30 tablet 1  . traMADol (ULTRAM) 50 MG tablet Take 1 tablet (50 mg total) by mouth every 8 (eight) hours as needed. 24 tablet 0   No current facility-administered medications for this visit.     REVIEW OF SYSTEMS:   Constitutional: Denies fevers, chills or night sweats Eyes: Denies blurriness of vision Ears, nose, mouth, throat, and face: Denies mucositis or sore throat Respiratory: Denies cough, dyspnea or wheezes Cardiovascular: Denies palpitation,  Gastrointestinal:  Denies nausea, heartburn or change in bowel habits Lymphatics: Denies new lymphadenopathy  Neurological:Denies numbness, tingling or new weaknesses Behavioral/Psych: Mood is stable, no new changes  All other systems were reviewed with the patient and are negative.  PHYSICAL EXAMINATION:  ECOG PERFORMANCE STATUS: 1 - Symptomatic but completely ambulatory  Filed Vitals:   03/12/15 1412  BP: 122/65  Pulse: 71  Temp: 98.2 F (36.8 C)  Resp: 19   Filed Weights   03/12/15 1412  Weight: 299 lb 14.4 oz (136.034 kg)    GENERAL:alert, no distress and comfortable. He is morbidly obese SKIN: He has significant synovitis on the  left lower extremity edema improved compared to the patient  EYES: normal, Conjunctiva are pink and non-injected, sclera clear HEART: regular rate & rhythm and no murmurs with mild bilateral lower extremity edema Musculoskeletal:no cyanosis of digits and no clubbing  NEURO: alert & oriented x 3 with fluent speech, no focal motor/sensory deficits  LABORATORY DATA:  I have reviewed the data as listed Results for orders placed or performed in visit on 03/12/15 (from the past 48 hour(s))  CBC with Differential     Status: Abnormal   Collection Time: 03/12/15  2:01 PM  Result Value Ref Range   WBC 3.8 (L) 4.0 - 10.3 10e3/uL   NEUT# 2.0 1.5 - 6.5 10e3/uL   HGB 11.9 (L) 13.0 - 17.1 g/dL   HCT 36.5 (L) 38.4 - 49.9 %   Platelets 66 (L) 140 - 400 10e3/uL   MCV 91.2 79.3 - 98.0 fL   MCH 29.7 27.2 - 33.4 pg   MCHC 32.5 32.0 - 36.0 g/dL   RBC 4.00 (L) 4.20 - 5.82 10e6/uL   RDW 16.0 (H) 11.0 - 14.6 %   lymph# 1.1 0.9 - 3.3 10e3/uL   MONO# 0.5 0.1 - 0.9 10e3/uL   Eosinophils Absolute 0.1 0.0 - 0.5 10e3/uL   Basophils Absolute 0.0 0.0 - 0.1 10e3/uL   NEUT% 52.8 39.0 - 75.0 %   LYMPH% 30.3 14.0 - 49.0 %   MONO% 13.4 0.0 - 14.0 %   EOS% 2.9 0.0 - 7.0 %   BASO% 0.6 0.0 - 2.0 %    Lab Results  Component Value Date   WBC 3.8* 03/12/2015   HGB 11.9* 03/12/2015   HCT 36.5* 03/12/2015   MCV 91.2 03/12/2015   PLT 66* 03/12/2015    ASSESSMENT & PLAN:  Thrombocytopenia 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 I would not recommend any further treatment unless absolutely necessary.The goal would be just to keep his platelet count above 50,000. He has completed prednisone taper in February.  He will continue dual antiplatelet agents as long as his platelet count remained above 30,000. I explained to the patient the rationale of continuing antiplatelet agents with chronic thrombocytopenia. Due to stability of his platelet count, I will see him back  in 6 months.     NSTEMI (non-ST elevated myocardial infarction) He has significant improvement of symptoms since his last saw his cardiologist. Recent heart catheterization did not show any significant disease warranting further intervention. He is put on pain extended-release nitrates and he denies further chest pain or shortness of breath.   Psoriasis The psoriasis is stable but does not bother him.    Recurrent epistaxis This is related to dual antiplatelet agents. I felt that the recurrent epistaxis is related to forceful sneezing and coughing. The present time, he does not need any further treatment. It is usually self-limiting.   Cellulitis He had recent cellulitis, improving while on antibiotic. I suspect this cellulitis is precipitated by chronic lower extremity edema and poor subcutaneous elation. I recommend he cut back on salt intake.    All questions were answered.  The patient knows to call the clinic with any problems, questions or concerns. No barriers to learning was detected.  I spent 15 minutes counseling the patient face to face. The total time spent in the appointment was 20 minutes and more than 50% was on counseling.     [facility], Prudencio Velazco, MD 4/21/20162:29 PM

## 2015-03-12 NOTE — Assessment & Plan Note (Signed)
He had recent cellulitis, improving while on antibiotic. I suspect this cellulitis is precipitated by chronic lower extremity edema and poor subcutaneous elation. I recommend he cut back on salt intake.

## 2015-03-12 NOTE — Assessment & Plan Note (Signed)
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 I would not recommend any further treatment unless absolutely necessary.The goal would be just to keep his platelet count above 50,000. He has completed prednisone taper in February.  He will continue dual antiplatelet agents as long as his platelet count remained above 30,000. I explained to the patient the rationale of continuing antiplatelet agents with chronic thrombocytopenia. Due to stability of his platelet count, I will see him back in 6 months.

## 2015-03-12 NOTE — Telephone Encounter (Signed)
Gave avs & calendar for October. °

## 2015-03-12 NOTE — Progress Notes (Signed)
Subjective:    Patient ID: Vincent Ochoa, male    DOB: 1956/06/12, 59 y.o.   MRN: [id number]  HPI  Pt in with follow up on cellulitis. He is upset with me since I would not give pain medication post ED eval(I wanted to see him rather than just call in med since worsening infection was my concern when I got message). Pt leg is now feeling better but still some pain on palpation. No fever, no chills or sweats.   Pt stated mid way through course of clindamycin. No diarrhea.   Pt had severe swelling early on. Pt states foot almost looked like soccer ball. Doppler done in ED was negative. Pt was given IV antibiotics then placed on oral clindamycin.  Rt upper chest. Wife felt moderate lump.(Pt never felt this before)  Fatigue for long time. Pt not anemic.  Kidney function other day in ED was good. Pt has low wbc and low platelets. He sees hematologist. I don't see tsh as well.    Review of Systems  Constitutional: Positive for fatigue. Negative for fever and chills.  Respiratory: Negative for cough, wheezing and stridor.   Cardiovascular: Negative for chest pain and palpitations.  Musculoskeletal:       Left lower pretibial pain. No current homans signs.  Skin: Positive for rash.       Lt foot was red.  Also rt pectoralis lump mid upper chest.  Hematological: Negative for adenopathy. Does not bruise/bleed easily.   Past Medical History  Diagnosis Date  . GERD (gastroesophageal reflux disease)   . Psoriasis   . Frequent headaches   . Facial cellulitis 2014    Periorbital  . Coronary artery disease     a. Evaluated by Dr. Stanford Breed 2012 - nonobstructive 2007-2008 by cath. b. Abnormal stress test 10/2014 but cath deferred temporarily due to thrombocytopenia. c. 10/2014: readm with AF-RVR/NSTEMI - s/p BMS to mLCx 11/04/14 (mod dz in RCA). ;  d.  Lexiscan Myoview:  Inf and inf-lat defect with partial reversibility suggesting ischemia, EF 43%; difficult study; Intermediate Risk   . Back  pain, lumbosacral 2014  . Thrombocytopenia     a. Onset unclear, but noted 2012. ? 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 s/p prednisone, IVIG.  Marland Kitchen Hyperlipidemia   . Obesity   . Elevated LFTs   . Fatty liver US - 2012  . Atrial fibrillation     a. Episode 10/2014 at time of NSTEMI, spont converted to NSR. Observation for further episodes (not on anticoag at present time due to [facility] = 1, thrombocytopenia).  . Sinus bradycardia   . OSA (obstructive sleep apnea)     severe with AHI 56/hr with successful CPAP titration to 18cm H2O    History   Social History  . Marital Status: Married    Spouse Name: N/A  . Number of Children: N/A  . Years of Education: N/A   Occupational History  . Not on file.   Social History Main Topics  . Smoking status: Current Some Day Smoker -- 1.50 packs/day for 43 years    Types: Cigarettes    Last Attempt to Quit: 10/30/2014  . Smokeless tobacco: Never Used     Comment: 11/28/2012 "chew occasionally"  . Alcohol Use: 0.0 oz/week    0 Standard drinks or equivalent per week     Comment: 11/28/2012 "beer q 6 months or so"  . Drug Use: No  . Sexual Activity: No  Other Topics Concern  . Not on file   Social History Narrative    Past Surgical History  Procedure Laterality Date  . Tonsillectomy  1960's  . Cholecystectomy  2007  . Knee arthroplasty  1970's  . Facial cosmetic surgery  2014  . Cardiac catheterization    . Left heart catheterization with coronary angiogram N/A 11/04/2014    Procedure: LEFT HEART CATHETERIZATION WITH CORONARY ANGIOGRAM;  Surgeon: Jettie Booze, MD;  Location: [facility] CATH LAB;  Service: Cardiovascular;  Laterality: N/A;  . Left heart catheterization with coronary angiogram N/A 01/28/2015    Procedure: LEFT HEART CATHETERIZATION WITH CORONARY ANGIOGRAM;  Surgeon: Sherren Mocha, MD;  Location: [facility] CATH LAB;  Service: Cardiovascular;  Laterality: N/A;    Family History  Problem  Relation Age of Onset  . Arthritis Maternal Grandmother   . Heart disease Sister   . Diabetes Sister   . Cancer - Ovarian Sister   . Heart disease      Maternal/paternal grandparents  . Cirrhosis Mother   . Heart attack Mother   . Heart attack Maternal Aunt   . Hypertension Mother   . Hypertension Father   . Stroke Father     No Known Allergies  Current Outpatient Prescriptions on File Prior to Visit  Medication Sig Dispense Refill  . aspirin 81 MG tablet Take 1 tablet (81 mg total) by mouth daily.    Marland Kitchen atorvastatin (LIPITOR) 10 MG tablet Take 1 tablet (10 mg total) by mouth every evening. 90 tablet 3  . clindamycin (CLEOCIN) 300 MG capsule Take 1 capsule (300 mg total) by mouth 4 (four) times daily. X 7 days 28 capsule 0  . clopidogrel (PLAVIX) 75 MG tablet Take 75 mg by mouth daily.    . diclofenac (VOLTAREN) 75 MG EC tablet Take 1 tablet (75 mg total) by mouth 2 (two) times daily. 30 tablet 0  . furosemide (LASIX) 20 MG tablet Take 10 mg by mouth daily. Patient taking one half tablet (10 mg) by mouth daily.    . isosorbide mononitrate (IMDUR) 30 MG 24 hr tablet Take 1 tablet (30 mg total) by mouth daily. 30 tablet 11  . meloxicam (MOBIC) 15 MG tablet Take 1 tablet (15 mg total) by mouth daily. 10 tablet 0  . metoprolol succinate (TOPROL-XL) 25 MG 24 hr tablet Take 0.5 tablets (12.5 mg total) by mouth daily. 30 tablet 11  . Multiple Vitamin (MULTIVITAMIN WITH MINERALS) TABS tablet Take 1 tablet by mouth daily.    . nitroGLYCERIN (NITROSTAT) 0.4 MG SL tablet Place 1 tablet (0.4 mg total) under the tongue every 5 (five) minutes as needed for chest pain (up to 3 doses). 25 tablet 3  . pantoprazole (PROTONIX) 40 MG tablet Take 1 tablet (40 mg total) by mouth daily. 30 tablet 1  . traMADol (ULTRAM) 50 MG tablet Take 1 tablet (50 mg total) by mouth every 8 (eight) hours as needed. 24 tablet 0   No current facility-administered medications on file prior to visit.    BP 130/77 mmHg   Pulse 74  Temp(Src) 98.4 F (36.9 C) (Oral)  Ht 6\' 3"  (1.905 m)  Wt 297 lb 3.2 oz (134.809 kg)  BMI 37.15 kg/m2  SpO2 97%      Objective:   Physical Exam  General- No acute distress. Pleasant patient. Neck- Full range of motion, no jvd Lungs- Clear, even and unlabored. Heart- regular rate and rhythm. Neurologic- CNII- XII grossly intact. Left lower ext- 12 cm x  8 cm. Mild red indurated tender area. Faint redness lt ankle as well. Negative homans sign. Rt upper chest- 2.5 cm lipoma like lump.     Assessment & Plan:

## 2015-03-19 ENCOUNTER — Ambulatory Visit: Payer: Medicaid Other | Admitting: Medical

## 2015-03-20 ENCOUNTER — Encounter: Payer: Self-pay | Admitting: Cardiology

## 2015-03-20 ENCOUNTER — Ambulatory Visit (INDEPENDENT_AMBULATORY_CARE_PROVIDER_SITE_OTHER): Payer: Medicaid Other | Admitting: Cardiology

## 2015-03-20 VITALS — BP 116/74 | HR 76 | Ht 75.0 in | Wt 307.0 lb

## 2015-03-20 DIAGNOSIS — E669 Obesity, unspecified: Secondary | ICD-10-CM | POA: Diagnosis not present

## 2015-03-20 DIAGNOSIS — G4733 Obstructive sleep apnea (adult) (pediatric): Secondary | ICD-10-CM

## 2015-03-20 NOTE — Patient Instructions (Signed)
Medication Instructions:  Your physician recommends that you continue on your current medications as directed. Please refer to the Current Medication list given to you today.   Labwork: None  Testing/Procedures: None  Follow-Up: Your physician wants you to follow-up in: 6 months with Dr. Radford Pax. You will receive a reminder letter in the mail two months in advance. If you don't receive a letter, please call our office to schedule the follow-up appointment.   Any Other Special Instructions Will Be Listed Below (If Applicable). I will call [place] and see about your download.

## 2015-03-20 NOTE — Progress Notes (Signed)
Cardiology Office Note   Date:  03/20/2015   ID:  Vincent Ochoa, DOB November 09, 1956, MRN [id number]  PCP:  Mackie Pai, PA-C    Chief Complaint  Patient presents with  . Sleep Apnea  . Obesity      History of Present Illness: Vincent Ochoa is a 59 y.o. male who presents for evaluation of OSA.  He recently had a sleep study ordered by Dr. Burt Knack which showed severe OSA with an AHI of 56 events per hour and underwent successful CPAP titration to 18cm H2O.   He says that he was waking up in the am very tired and felt like he had not slept the night before.  He also was snoring at night.   He now presents for followup.  He tolerates his CPAP well. He tolerates the full face mask and feels the pressure is adequate.  Since going on the CPAP he feels much more rested in the am but sometimes is still tired.  He does still fall asleep during the day if he sits in his recliner but says that the daytime sleepiness has significantly improved.     Past Medical History  Diagnosis Date  . GERD (gastroesophageal reflux disease)   . Psoriasis   . Frequent headaches   . Facial cellulitis 2014    Periorbital  . Coronary artery disease     a. Evaluated by Dr. Stanford Breed 2012 - nonobstructive 2007-2008 by cath. b. Abnormal stress test 10/2014 but cath deferred temporarily due to thrombocytopenia. c. 10/2014: readm with AF-RVR/NSTEMI - s/p BMS to mLCx 11/04/14 (mod dz in RCA). ;  d.  Lexiscan Myoview:  Inf and inf-lat defect with partial reversibility suggesting ischemia, EF 43%; difficult study; Intermediate Risk   . Back pain, lumbosacral 2014  . Thrombocytopenia     a. Onset unclear, but noted 2012. ? 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 s/p prednisone, IVIG.  Marland Kitchen Hyperlipidemia   . Obesity   . Elevated LFTs   . Fatty liver US - 2012  . Atrial fibrillation     a. Episode 10/2014 at time of NSTEMI, spont converted to NSR. Observation for further episodes (not on  anticoag at present time due to [facility] = 1, thrombocytopenia).  . Sinus bradycardia   . OSA (obstructive sleep apnea)     severe with AHI 56/hr with successful CPAP titration to 18cm H2O    Past Surgical History  Procedure Laterality Date  . Tonsillectomy  1960's  . Cholecystectomy  2007  . Knee arthroplasty  1970's  . Facial cosmetic surgery  2014  . Cardiac catheterization    . Left heart catheterization with coronary angiogram N/A 11/04/2014    Procedure: LEFT HEART CATHETERIZATION WITH CORONARY ANGIOGRAM;  Surgeon: Jettie Booze, MD;  Location: [facility] CATH LAB;  Service: Cardiovascular;  Laterality: N/A;  . Left heart catheterization with coronary angiogram N/A 01/28/2015    Procedure: LEFT HEART CATHETERIZATION WITH CORONARY ANGIOGRAM;  Surgeon: Sherren Mocha, MD;  Location: [facility] CATH LAB;  Service: Cardiovascular;  Laterality: N/A;     Current Outpatient Prescriptions  Medication Sig Dispense Refill  . aspirin 81 MG tablet Take 1 tablet (81 mg total) by mouth daily.    Marland Kitchen atorvastatin (LIPITOR) 10 MG tablet Take 1 tablet (10 mg total) by mouth every evening. 90 tablet 3  . clindamycin (CLEOCIN) 300 MG capsule Take 1 capsule (300 mg total) by mouth 4 (four) times daily. X 7 days  28 capsule 0  . clopidogrel (PLAVIX) 75 MG tablet Take 75 mg by mouth daily.    . diclofenac (VOLTAREN) 75 MG EC tablet Take 1 tablet (75 mg total) by mouth 2 (two) times daily. 30 tablet 0  . furosemide (LASIX) 20 MG tablet Take 10 mg by mouth daily. Patient taking one half tablet (10 mg) by mouth daily.    . isosorbide mononitrate (IMDUR) 30 MG 24 hr tablet Take 1 tablet (30 mg total) by mouth daily. 30 tablet 11  . meloxicam (MOBIC) 15 MG tablet Take 1 tablet (15 mg total) by mouth daily. 10 tablet 0  . metoprolol succinate (TOPROL-XL) 25 MG 24 hr tablet Take 0.5 tablets (12.5 mg total) by mouth daily. 30 tablet 11  . Multiple Vitamin (MULTIVITAMIN WITH MINERALS) TABS tablet Take 1 tablet by mouth  daily.    . nitroGLYCERIN (NITROSTAT) 0.4 MG SL tablet Place 1 tablet (0.4 mg total) under the tongue every 5 (five) minutes as needed for chest pain (up to 3 doses). 25 tablet 3  . pantoprazole (PROTONIX) 40 MG tablet Take 1 tablet (40 mg total) by mouth daily. 30 tablet 1  . traMADol (ULTRAM) 50 MG tablet Take 1 tablet (50 mg total) by mouth every 8 (eight) hours as needed. 24 tablet 0   No current facility-administered medications for this visit.    Allergies:   Review of patient's allergies indicates no known allergies.    Social History:  The patient  reports that he quit smoking about 4 months ago. His smoking use included Cigarettes. He has a 64.5 pack-year smoking history. He has never used smokeless tobacco. He reports that he drinks alcohol. He reports that he does not use illicit drugs.   Family History:  The patient's family history includes Arthritis in his maternal grandmother; Cancer - Ovarian in his sister; Cirrhosis in his mother; Diabetes in his sister; Heart attack in his maternal aunt and mother; Heart disease in his sister and another family member; Hypertension in his father and mother; Stroke in his father.    ROS:  Please see the history of present illness.   Otherwise, review of systems are positive for none.   All other systems are reviewed and negative.    PHYSICAL EXAM: VS:  BP 116/74 mmHg  Pulse 76  Ht 6\' 3"  (1.905 m)  Wt 307 lb (139.254 kg)  BMI 38.37 kg/m2 , BMI Body mass index is 38.37 kg/(m^2). GEN: Well nourished, well developed, in no acute distress HEENT: normal Neck: no JVD, carotid bruits, or masses Cardiac: RRR; no murmurs, rubs, or gallops,no edema  Respiratory:  clear to auscultation bilaterally, normal work of breathing GI: soft, nontender, nondistended, + BS MS: no deformity or atrophy Skin: warm and dry, no rash Neuro:  Strength and sensation are intact Psych: euthymic mood, full affect   EKG:  EKG is not ordered today.    Recent  Labs: 10/28/2014: Magnesium 1.7 11/03/2014: ALT 64* 01/09/2015: Pro B Natriuretic peptide (BNP) 43.0 03/06/2015: BUN 10; Creatinine 0.69; Potassium 3.8; Sodium 137 03/12/2015: Hemoglobin 11.9*; Platelets 66*; TSH 1.43    Lipid Panel    Component Value Date/Time   CHOL 174 10/29/2014 0455   TRIG 174* 10/29/2014 0455   HDL 24* 10/29/2014 0455   CHOLHDL 7.3 10/29/2014 0455   VLDL 35 10/29/2014 0455   LDLCALC 115* 10/29/2014 0455      Wt Readings from Last 3 Encounters:  03/20/15 307 lb (139.254 kg)  03/12/15 299 lb 14.4  oz (136.034 kg)  03/12/15 297 lb 3.2 oz (134.809 kg)        ASSESSMENT AND PLAN:  1.  Severe OSA on CPAP and tolerating well.  His download today showed an AHI of 2.7 per hour on 18cm H2O but on ly 43% compliance in using more than 4 hours nightly.  We discussed the need to be compliant in using at least 4-5 hours nightly for >70% of the time or insurance will not pay for the device and he will not get the full benefit.  I will talk with the DME about the compliance since he says he is using it nightly and has not missed any nights.   2.  Obesity   Current medicines are reviewed at length with the patient today.  The patient does not have concerns regarding medicines.  The following changes have been made:  no change  Labs/ tests ordered today include: see above assessment and plan No orders of the defined types were placed in this encounter.     Disposition:   FU with me in 6 months   Signed, Sueanne Margarita, MD  03/20/2015 11:09 AM    [facility] [address], [place], [place]  [address] Phone: [phone]; Fax: [phone]

## 2015-03-26 ENCOUNTER — Ambulatory Visit: Payer: Medicaid Other | Admitting: Medical

## 2015-04-07 ENCOUNTER — Ambulatory Visit (INDEPENDENT_AMBULATORY_CARE_PROVIDER_SITE_OTHER): Payer: Medicaid Other | Admitting: Family Medicine

## 2015-04-07 ENCOUNTER — Encounter: Payer: Self-pay | Admitting: Family Medicine

## 2015-04-07 VITALS — BP 137/72 | HR 86 | Temp 98.1°F | Ht 75.0 in | Wt 296.4 lb

## 2015-04-07 DIAGNOSIS — Z720 Tobacco use: Secondary | ICD-10-CM

## 2015-04-07 DIAGNOSIS — R310 Gross hematuria: Secondary | ICD-10-CM | POA: Diagnosis not present

## 2015-04-07 DIAGNOSIS — R319 Hematuria, unspecified: Secondary | ICD-10-CM

## 2015-04-07 DIAGNOSIS — M25562 Pain in left knee: Secondary | ICD-10-CM

## 2015-04-07 DIAGNOSIS — D696 Thrombocytopenia, unspecified: Secondary | ICD-10-CM | POA: Diagnosis not present

## 2015-04-07 DIAGNOSIS — M25561 Pain in right knee: Secondary | ICD-10-CM

## 2015-04-07 DIAGNOSIS — I25119 Atherosclerotic heart disease of native coronary artery with unspecified angina pectoris: Secondary | ICD-10-CM | POA: Diagnosis not present

## 2015-04-07 DIAGNOSIS — L03116 Cellulitis of left lower limb: Secondary | ICD-10-CM | POA: Diagnosis not present

## 2015-04-07 LAB — POCT URINALYSIS DIPSTICK
Bilirubin, UA: NEGATIVE
GLUCOSE UA: NEGATIVE
Ketones, UA: NEGATIVE
Nitrite, UA: NEGATIVE
SPEC GRAV UA: 1.02
UROBILINOGEN UA: 4
pH, UA: 7.5

## 2015-04-07 LAB — POCT UA - MICROSCOPIC ONLY
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
## 2015-04-07 NOTE — Patient Instructions (Signed)
Thank you for coming in,   Please follow up with me on June 2.   I will get some labwork today and call you with the results.   I will change your antibiotic to doxycycline since you are having some pain and leg swelling still.   Please bring all of your medications with you to each visit.    Please feel free to call with any questions or concerns at any time, at [phone]. --Dr. Raeford Razor  Cellulitis Cellulitis is an infection of the skin and the tissue beneath it. The infected area is usually red and tender. Cellulitis occurs most often in the arms and lower legs.  CAUSES  Cellulitis is caused by bacteria that enter the skin through cracks or cuts in the skin. The most common types of bacteria that cause cellulitis are staphylococci and streptococci. SIGNS AND SYMPTOMS   Redness and warmth.  Swelling.  Tenderness or pain.  Fever. DIAGNOSIS  Your health care provider can usually determine what is wrong based on a physical exam. Blood tests may also be done. TREATMENT  Treatment usually involves taking an antibiotic medicine. HOME CARE INSTRUCTIONS   Take your antibiotic medicine as directed by your health care provider. Finish the antibiotic even if you start to feel better.  Keep the infected arm or leg elevated to reduce swelling.  Apply a warm cloth to the affected area up to 4 times per day to relieve pain.  Take medicines only as directed by your health care provider.  Keep all follow-up visits as directed by your health care provider. SEEK MEDICAL CARE IF:   You notice red streaks coming from the infected area.  Your red area gets larger or turns dark in color.  Your bone or joint underneath the infected area becomes painful after the skin has healed.  Your infection returns in the same area or another area.  You notice a swollen bump in the infected area.  You develop new symptoms.  You have a fever. SEEK IMMEDIATE MEDICAL CARE IF:   You feel very  sleepy.  You develop vomiting or diarrhea.  You have a general ill feeling (malaise) with muscle aches and pains. MAKE SURE YOU:   Understand these instructions.  Will watch your condition.  Will get help right away if you are not doing well or get worse. Document Released: 08/17/2005 Document Revised: 03/24/2014 Document Reviewed: 01/23/2012 [facility] Patient Information 2015 [place], [place]. This information is not intended to replace advice given to you by your health care provider. Make sure you discuss any questions you have with your health care provider.

## 2015-04-08 ENCOUNTER — Encounter: Payer: Self-pay | Admitting: Family Medicine

## 2015-04-08 ENCOUNTER — Telehealth: Payer: Self-pay | Admitting: Family Medicine

## 2015-04-08 DIAGNOSIS — R310 Gross hematuria: Secondary | ICD-10-CM | POA: Insufficient documentation

## 2015-04-08 DIAGNOSIS — L03116 Cellulitis of left lower limb: Secondary | ICD-10-CM

## 2015-04-08 MED ORDER — DOXYCYCLINE HYCLATE 100 MG PO TABS: 100.0000 mg | ORAL_TABLET | Freq: Two times a day (BID) | ORAL | Status: DC

## 2015-04-08 NOTE — Assessment & Plan Note (Signed)
Improved but still present. Mild erythema and warmth compared to the right. Has had venous duplex recently showing no DVT. Wells score 1  - will give doxycycline

## 2015-04-08 NOTE — Assessment & Plan Note (Signed)
Reports to me that he has stopped smoking.

## 2015-04-08 NOTE — Assessment & Plan Note (Signed)
Former smoker and painless in nature. Chronic thrombocytopenia.  UA showing large Hgb. UA done in Dec with no Hgb. Concerning for cancer  - Urine cx: pending  - referral to Urology  - Discussed with Dr. Wendy Poet

## 2015-04-08 NOTE — Telephone Encounter (Signed)
Spoke with patient. Sent in doxycycline for his ongoing cellulitis.   Patient with gross hematuria on UA yesterday. Discussed with Dr. McDiarmid which suggested referral to urology for work up. Patient is ex-smoker and greater than 59 yo which suggests a cancer source.   Rosemarie Ax, MD PGY-2, [facility] 04/08/2015, 5:07 PM

## 2015-04-08 NOTE — Telephone Encounter (Signed)
Pt called to check the status of his request for a different type of antibiotic. I didn't see any notes to a previous message. jw

## 2015-04-08 NOTE — Assessment & Plan Note (Signed)
Imaging showing only mild degenerative changes. 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  - stop all NSAIDS  - tylenol as needed but hx of elevated LFT's so need to watch.  - could consider injections but hx of thrombocytopenia and on ASA and plavix which is complicating  - consider trial of PT  - encourage weight loss  - has tramadol for pain  - give home modalities at f/u - f/u at June 2

## 2015-04-08 NOTE — Telephone Encounter (Signed)
Will forward to MD.  Abx change was documented on his avs. Myrlene Riera,CMA

## 2015-04-09 LAB — URINE CULTURE
Colony Count: NO GROWTH
Organism ID, Bacteria: NO GROWTH

## 2015-04-10 ENCOUNTER — Encounter: Payer: Self-pay | Admitting: Cardiology

## 2015-04-10 NOTE — Progress Notes (Signed)
I was available as preceptor to resident for this patient's office visit.  

## 2015-04-23 ENCOUNTER — Ambulatory Visit (INDEPENDENT_AMBULATORY_CARE_PROVIDER_SITE_OTHER): Payer: Medicaid Other | Admitting: Family Medicine

## 2015-04-23 ENCOUNTER — Encounter: Payer: Self-pay | Admitting: Family Medicine

## 2015-04-23 ENCOUNTER — Ambulatory Visit (INDEPENDENT_AMBULATORY_CARE_PROVIDER_SITE_OTHER): Payer: Medicaid Other | Admitting: Pharmacist

## 2015-04-23 VITALS — BP 144/78 | HR 68 | Temp 98.0°F | Ht 75.0 in | Wt 301.0 lb

## 2015-04-23 VITALS — BP 114/56 | HR 77 | Ht 75.0 in | Wt 299.5 lb

## 2015-04-23 DIAGNOSIS — R740 Nonspecific elevation of levels of transaminase and lactic acid dehydrogenase [LDH]: Secondary | ICD-10-CM

## 2015-04-23 DIAGNOSIS — Z720 Tobacco use: Secondary | ICD-10-CM

## 2015-04-23 DIAGNOSIS — L03116 Cellulitis of left lower limb: Secondary | ICD-10-CM

## 2015-04-23 DIAGNOSIS — M7989 Other specified soft tissue disorders: Secondary | ICD-10-CM | POA: Insufficient documentation

## 2015-04-23 DIAGNOSIS — M25561 Pain in right knee: Secondary | ICD-10-CM | POA: Diagnosis present

## 2015-04-23 DIAGNOSIS — R310 Gross hematuria: Secondary | ICD-10-CM

## 2015-04-23 DIAGNOSIS — M25562 Pain in left knee: Secondary | ICD-10-CM

## 2015-04-23 DIAGNOSIS — R7401 Elevation of levels of liver transaminase levels: Secondary | ICD-10-CM | POA: Insufficient documentation

## 2015-04-23 MED ORDER — TRAMADOL HCL 50 MG PO TABS: 50.0000 mg | ORAL_TABLET | Freq: Three times a day (TID) | ORAL | Status: DC | PRN

## 2015-04-23 MED ORDER — BUPROPION HCL ER (XL) 150 MG PO TB24: 150.0000 mg | ORAL_TABLET | Freq: Every day | ORAL | Status: DC

## 2015-04-23 NOTE — Progress Notes (Signed)
S:    Patient arrives in good spirits with wife Vincent Ochoa.    He presents to the clinic for PAD/ABI evaluation and tobacco cessation assistance.     Reports pain with walking. Some days worse than others. Pain is described as sharp and occurs while at rest or standing still. Reports pain worsens when walking up hill or in a hurry. Reports pain when walking at an ordinary pace on a level surface.   Reports pain resolves on sitting and propping legs up.  Pain is localized to legs and knees.    Patient reports quitting smoking for 2.5 months early in the year and returned to smoking after several family members encouraged him to restart.   He expressed interest in attempt to quit smoking again.  O:  Lower extremity Physical Exam includes absence of limb hair, edema, thickened brittle nails.)  bilateral  ABI overall = 1.0 Right Arm 116 mmHg    Left Arm 120 mmHg Right ankle posterior tibial 120 mmHg     dorsalis pedis 120 mmHg Left ankle posterior tibial 132 mmHg    dorsalis pedis 130 mmHg   A/P: Normal ABI and low likelihood of PAD based on ABI of 1.0 in a patient with symptoms of knee and lower extremity pain and swelling.  No change in medications as a result of PAD evaluation.  Chronic tobacco abuse history in a patient willing to make another attempt at tobacco cessation (recently quit for 2.5 months).  Initiated bupropion 150mg  XL once daily for 1 week then instructed to increase to 300mg  XL daily.   Patient was educated on benefit of reduced nicotine withdrawal and potential to assist with weight loss.   He verbalized understanding of treatment plan.  Provided education and motivational interviewing - counseling.  Reevaluate tobacco use at next visit with Dr. Raeford Razor.   Total time in face-to-face counseling 45 minutes.  Patient seen with Nilsa Nutting, PharmD Candidate and Elenor Quinones, PharmD Resident.

## 2015-04-23 NOTE — Assessment & Plan Note (Signed)
W/up to this point is showing NAFLD. Not diabetic but obese. Doesn't use APAP and no EtOH  Needs to lose weight  - will discuss on next visit but will refer to GI for possible liver bx - discussed with Dr. Erin Hearing.

## 2015-04-23 NOTE — Progress Notes (Signed)
Subjective:    Patient ID: Vincent Ochoa, male    DOB: October 23, 1956, 59 y.o.   MRN: [id number]  HPI  Vincent Ochoa is here for f/u.  Leg swelling: still occuring. No orthopnea, PND, or increase in the number of pillows behind his back.  He hasn't bee using his CPAP the last couple of days because he has been away fishing. His weight is slighlty increased but it down compared to other office visits. Worse at the end of the day. He takes his lasix in the morning.  He reports having cramping sensation in his legs when he first wakes up.    Cellulitis: He has been taking doxycycline and has improved erythema. Denise any fevers or chills or night sweats.    Hematuria: still having gross hematuria. Intermittent with the amount of blood in his urine. Ranges from clear to tea colored. Painless in nature.  Patient has started smoking again since new stress in his life.    Transaminates: hx of [place] in 2012 showing fatty infiltration of  Liver. He doesn't take tylenol and no EtOH.  Hepatitis panel was neg in 2015.    Tobacco abuse: started smoking again due to stressors at home. He was stopped previously. He would like something to stop.   Knee pain: The pain in his knees complicates his ability to walk. The pain is sharp. He isn't currently taking anything for his pain.    Current Outpatient Prescriptions on File Prior to Visit  Medication Sig Dispense Refill  . aspirin 81 MG tablet Take 1 tablet (81 mg total) by mouth daily.    Marland Kitchen atorvastatin (LIPITOR) 10 MG tablet Take 1 tablet (10 mg total) by mouth every evening. 90 tablet 3  . clindamycin (CLEOCIN) 300 MG capsule Take 1 capsule (300 mg total) by mouth 4 (four) times daily. X 7 days (Patient not taking: Reported on 04/07/2015) 28 capsule 0  . clopidogrel (PLAVIX) 75 MG tablet Take 75 mg by mouth daily.    . diclofenac (VOLTAREN) 75 MG EC tablet Take 1 tablet (75 mg total) by mouth 2 (two) times daily. (Patient not taking: Reported on 04/07/2015) 30  tablet 0  . doxycycline (VIBRA-TABS) 100 MG tablet Take 1 tablet (100 mg total) by mouth 2 (two) times daily. 28 tablet 0  . furosemide (LASIX) 20 MG tablet Take 10 mg by mouth daily. Patient taking one half tablet (10 mg) by mouth daily.    . isosorbide mononitrate (IMDUR) 30 MG 24 hr tablet Take 1 tablet (30 mg total) by mouth daily. (Patient not taking: Reported on 04/07/2015) 30 tablet 11  . meloxicam (MOBIC) 15 MG tablet Take 1 tablet (15 mg total) by mouth daily. (Patient not taking: Reported on 04/07/2015) 10 tablet 0  . metoprolol succinate (TOPROL-XL) 25 MG 24 hr tablet Take 0.5 tablets (12.5 mg total) by mouth daily. 30 tablet 11  . Multiple Vitamin (MULTIVITAMIN WITH MINERALS) TABS tablet Take 1 tablet by mouth daily.    . nitroGLYCERIN (NITROSTAT) 0.4 MG SL tablet Place 1 tablet (0.4 mg total) under the tongue every 5 (five) minutes as needed for chest pain (up to 3 doses). 25 tablet 3  . pantoprazole (PROTONIX) 40 MG tablet Take 1 tablet (40 mg total) by mouth daily. 30 tablet 1  . traMADol (ULTRAM) 50 MG tablet Take 1 tablet (50 mg total) by mouth every 8 (eight) hours as needed. 24 tablet 0   No current facility-administered medications on file prior to  visit.    SHx: has started smoking again since his brother in law moved into his house.   Health Maintenance: needs colonoscopy.   Review of Systems See HPI     Objective:   Physical Exam BP 144/78 mmHg  Pulse 68  Temp(Src) 98 F (36.7 C) (Oral)  Ht 6\' 3"  (1.905 m)  Wt 301 lb (136.533 kg)  BMI 37.62 kg/m2 Gen: NAD, alert, cooperative with exam,  HEENT: NCAT, EOMI,  clear conjunctiva,  CV: RRR, good S1/S2, no murmur, capillary refill brisk, +1-+2 pitting edema b/l, +2 pulses DP and PT in right but +1 in left.   Resp: CTABL, no wheezes, non-labored Abd:  Protuberant  Skin: minimal hair on lower extremity  Neuro: no gross deficits.       Assessment & Plan:

## 2015-04-23 NOTE — Assessment & Plan Note (Signed)
Started smoking again. He would like to try something to stop.  His brother in law has moved in and is a smoker.  - Consider starting chantix or wellbutrin  - Ask what he has tried in the past that has worked

## 2015-04-23 NOTE — Assessment & Plan Note (Signed)
Knee pain b/l continued. Not taking anything for pain.  Elevated liver enzymes so avoiding APAP  Hx of MI and CAD and having leg swelling so avoiding NSAIDS. Possible to try naproxen down the road.  - given tramadol #24  - will email Dr. Alvy Bimler for her input about injection his knees given chronic thrombocytopenia.

## 2015-04-23 NOTE — Assessment & Plan Note (Signed)
Erythema has improved. Has bee on doxycycline since last appt and clindamycin prior to that.  No diarrhea.  - will stop ABX

## 2015-04-23 NOTE — Progress Notes (Signed)
I was preceptor the day of this visit.   

## 2015-04-23 NOTE — Assessment & Plan Note (Signed)
Worse at the end of the day. Is taking lasix 20 mg. Is obese. His knee pain complicates how much he is able to walk.  - increase lasix to 40 mg daily.  - bmp at next visit.  - will refer to Dr. Valentina Lucks for ABI's to evaluate for PAD prior to placing any compression stockings.

## 2015-04-23 NOTE — Assessment & Plan Note (Signed)
Est with urology. Has f/u on June 7th. They are working him up.  Still reporting gross hematuria.  Started smoking again due to stressors at home.

## 2015-04-23 NOTE — Patient Instructions (Addendum)
Leg blood flow test showed normal results.   Start Wellbutrin XL for smoking cessation. Take 150 mg daily for 7 days then increase to 300 mg daily. Good luck with quitting smoking.  Follow up with Dr. Raeford Razor.

## 2015-04-23 NOTE — Patient Instructions (Signed)
Thank you for coming in,   Increase the amount of lasix that you take to 40 mg daily.   Please schedule an appointment with Dr. Valentina Lucks at the front desk when you leave today.   I will talk with Dr. Alvy Bimler about being able to inject your knees.   I'll give you some tramadol for now.   Please bring all of your medications with you to each visit.    Please feel free to call with any questions or concerns at any time, at [phone]. --Dr. Raeford Razor

## 2015-04-24 ENCOUNTER — Encounter: Payer: Self-pay | Admitting: Pharmacist

## 2015-04-24 NOTE — Progress Notes (Signed)
Patient ID: Vincent Ochoa, male   DOB: Oct 31, 1956, 59 y.o.   MRN: [id number] Reviewed: Agree with Dr. Graylin Shiver documentation and management.

## 2015-04-24 NOTE — Assessment & Plan Note (Signed)
Normal ABI and low likelihood of PAD based on ABI of 1.0 in a patient with symptoms of knee and lower extremity pain and swelling.  No change in medications as a result of PAD evaluation.

## 2015-04-24 NOTE — Assessment & Plan Note (Signed)
Chronic tobacco abuse history in a patient willing to make another attempt at tobacco cessation (recently quit for 2.5 months).  Initiated bupropion 150mg  XL once daily for 1 week then instructed to increase to 300mg  XL daily.   Patient was educated on benefit of reduced nicotine withdrawal and potential to assist with weight loss.   He verbalized understanding of treatment plan.  Provided education and motivational interviewing - counseling.

## 2015-04-27 ENCOUNTER — Encounter: Payer: Self-pay | Admitting: Family Medicine

## 2015-04-27 NOTE — Progress Notes (Signed)
Patient stopped by to make a follow up appointment with you after he saw Koval, but we weren't sure when you wanted him to be seen again.  You only have same day appts avail for the month of June.  When would you like him to be scheduled?

## 2015-04-29 ENCOUNTER — Telehealth: Payer: Self-pay | Admitting: Family Medicine

## 2015-04-29 NOTE — Telephone Encounter (Signed)
Spoke with patient and informed that he could receive injections in his knees. He will call and schedule an appt with me on Monday at 3:15 pm.   Rosemarie Ax, MD PGY-2, [facility] 04/29/2015, 3:12 PM

## 2015-05-04 ENCOUNTER — Encounter: Payer: Self-pay | Admitting: Family Medicine

## 2015-05-04 ENCOUNTER — Ambulatory Visit (INDEPENDENT_AMBULATORY_CARE_PROVIDER_SITE_OTHER): Payer: Medicaid Other | Admitting: Family Medicine

## 2015-05-04 VITALS — BP 132/56 | HR 88 | Temp 98.2°F | Ht 75.0 in | Wt 303.2 lb

## 2015-05-04 DIAGNOSIS — M25562 Pain in left knee: Secondary | ICD-10-CM | POA: Diagnosis not present

## 2015-05-04 DIAGNOSIS — M25561 Pain in right knee: Secondary | ICD-10-CM | POA: Diagnosis not present

## 2015-05-04 DIAGNOSIS — Z720 Tobacco use: Secondary | ICD-10-CM

## 2015-05-04 MED ORDER — METHYLPREDNISOLONE ACETATE 80 MG/ML IJ SUSP
80.0000 mg | Freq: Once | INTRAMUSCULAR | Status: AC
  Administered 2015-05-04: 80 mg via INTRA_ARTICULAR

## 2015-05-04 NOTE — Patient Instructions (Signed)
Thank you for coming in,   Please let me know if have any redness or   Please bring all of your medications with you to each visit.    Please feel free to call with any questions or concerns at any time, at [phone]. --Dr. Raeford Razor

## 2015-05-04 NOTE — Progress Notes (Signed)
I was available as preceptor to resident for this patient's office visit.  

## 2015-05-04 NOTE — Assessment & Plan Note (Signed)
B/l knee injections today. Tolerated the procedure well.
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
## 2015-05-04 NOTE — Assessment & Plan Note (Signed)
Cut down to 4 cigarettes. Is tolerating the wellbutrin

## 2015-05-06 ENCOUNTER — Encounter: Payer: Self-pay | Admitting: Family Medicine

## 2015-05-06 NOTE — Progress Notes (Signed)
I was preceptor for this office visit.  

## 2015-06-01 ENCOUNTER — Encounter (HOSPITAL_COMMUNITY): Payer: Self-pay | Admitting: Nurse Practitioner

## 2015-06-01 ENCOUNTER — Emergency Department (HOSPITAL_COMMUNITY): Payer: Medicaid Other

## 2015-06-01 ENCOUNTER — Telehealth: Payer: Self-pay | Admitting: Cardiovascular Disease

## 2015-06-01 ENCOUNTER — Observation Stay (HOSPITAL_COMMUNITY)
Admission: EM | Admit: 2015-06-01 | Discharge: 2015-06-02 | Disposition: A | Discharge status: Home / Self Care | Payer: Medicaid Other | Attending: Cardiovascular Disease | Admitting: Cardiovascular Disease

## 2015-06-01 DIAGNOSIS — I480 Paroxysmal atrial fibrillation: Secondary | ICD-10-CM | POA: Diagnosis present

## 2015-06-01 DIAGNOSIS — Z792 Long term (current) use of antibiotics: Secondary | ICD-10-CM | POA: Insufficient documentation

## 2015-06-01 DIAGNOSIS — L409 Psoriasis, unspecified: Secondary | ICD-10-CM | POA: Diagnosis present

## 2015-06-01 DIAGNOSIS — Z7902 Long term (current) use of antithrombotics/antiplatelets: Secondary | ICD-10-CM | POA: Diagnosis not present

## 2015-06-01 DIAGNOSIS — I25119 Atherosclerotic heart disease of native coronary artery with unspecified angina pectoris: Secondary | ICD-10-CM

## 2015-06-01 DIAGNOSIS — T82858A Stenosis of vascular prosthetic devices, implants and grafts, initial encounter: Secondary | ICD-10-CM | POA: Insufficient documentation

## 2015-06-01 DIAGNOSIS — E669 Obesity, unspecified: Secondary | ICD-10-CM

## 2015-06-01 DIAGNOSIS — Y832 Surgical operation with anastomosis, bypass or graft as the cause of abnormal reaction of the patient, or of later complication, without mention of misadventure at the time of the procedure: Secondary | ICD-10-CM | POA: Diagnosis not present

## 2015-06-01 DIAGNOSIS — K219 Gastro-esophageal reflux disease without esophagitis: Secondary | ICD-10-CM | POA: Diagnosis present

## 2015-06-01 DIAGNOSIS — Z79899 Other long term (current) drug therapy: Secondary | ICD-10-CM | POA: Insufficient documentation

## 2015-06-01 DIAGNOSIS — F17210 Nicotine dependence, cigarettes, uncomplicated: Secondary | ICD-10-CM | POA: Insufficient documentation

## 2015-06-01 DIAGNOSIS — Z7982 Long term (current) use of aspirin: Secondary | ICD-10-CM | POA: Diagnosis not present

## 2015-06-01 DIAGNOSIS — I25110 Atherosclerotic heart disease of native coronary artery with unstable angina pectoris: Principal | ICD-10-CM | POA: Insufficient documentation

## 2015-06-01 DIAGNOSIS — I4891 Unspecified atrial fibrillation: Secondary | ICD-10-CM | POA: Diagnosis not present

## 2015-06-01 DIAGNOSIS — G4733 Obstructive sleep apnea (adult) (pediatric): Secondary | ICD-10-CM | POA: Diagnosis not present

## 2015-06-01 DIAGNOSIS — Z720 Tobacco use: Secondary | ICD-10-CM

## 2015-06-01 DIAGNOSIS — Z791 Long term (current) use of non-steroidal anti-inflammatories (NSAID): Secondary | ICD-10-CM | POA: Insufficient documentation

## 2015-06-01 DIAGNOSIS — R079 Chest pain, unspecified: Secondary | ICD-10-CM | POA: Diagnosis present

## 2015-06-01 DIAGNOSIS — I200 Unstable angina: Secondary | ICD-10-CM

## 2015-06-01 DIAGNOSIS — E785 Hyperlipidemia, unspecified: Secondary | ICD-10-CM

## 2015-06-01 DIAGNOSIS — Z6836 Body mass index (BMI) 36.0-36.9, adult: Secondary | ICD-10-CM | POA: Insufficient documentation

## 2015-06-01 DIAGNOSIS — R0602 Shortness of breath: Secondary | ICD-10-CM

## 2015-06-01 DIAGNOSIS — D696 Thrombocytopenia, unspecified: Secondary | ICD-10-CM | POA: Diagnosis present

## 2015-06-01 DIAGNOSIS — I2510 Atherosclerotic heart disease of native coronary artery without angina pectoris: Secondary | ICD-10-CM | POA: Diagnosis present

## 2015-06-01 LAB — BASIC METABOLIC PANEL
Anion gap: 5 (ref 5–15)
BUN: 9 mg/dL (ref 6–20)
CALCIUM: 9.1 mg/dL (ref 8.9–10.3)
CO2: 27 mmol/L (ref 22–32)
CREATININE: 0.97 mg/dL (ref 0.61–1.24)
Chloride: 106 mmol/L (ref 101–111)
GFR calc Af Amer: >60 mL/min (ref >60)
GFR calc non Af Amer: >60 mL/min (ref >60)
GLUCOSE: 97 mg/dL (ref 65–99)
POTASSIUM: 4 mmol/L (ref 3.5–5.1)
SODIUM: 138 mmol/L (ref 135–145)

## 2015-06-01 LAB — CBC
HCT: 35.8 % — ABNORMAL LOW (ref 39.0–52.0)
Hemoglobin: 12.4 g/dL — ABNORMAL LOW (ref 13.0–17.0)
MCH: 31.5 pg (ref 26.0–34.0)
MCHC: 34.6 g/dL (ref 30.0–36.0)
MCV: 90.9 fL (ref 78.0–100.0)
PLATELETS: 52 10*3/uL — AB (ref 150–400)
RBC: 3.94 MIL/uL — ABNORMAL LOW (ref 4.22–5.81)
RDW: 14.4 % (ref 11.5–15.5)
WBC: 3.9 10*3/uL — ABNORMAL LOW (ref 4.0–10.5)

## 2015-06-01 LAB — I-STAT TROPONIN, ED: Troponin i, poc: 0 ng/mL (ref 0.00–0.08)

## 2015-06-01 LAB — TROPONIN I

## 2015-06-01 LAB — BRAIN NATRIURETIC PEPTIDE: B Natriuretic Peptide: 148.6 pg/mL — ABNORMAL HIGH (ref 0.0–100.0)

## 2015-06-01 MED ORDER — PANTOPRAZOLE SODIUM 40 MG PO TBEC
40.0000 mg | DELAYED_RELEASE_TABLET | Freq: Every day | ORAL | Status: DC
  Administered 2015-06-02: 40 mg via ORAL
  Filled 2015-06-01: qty 1

## 2015-06-01 MED ORDER — METOPROLOL SUCCINATE ER 25 MG PO TB24
12.5000 mg | ORAL_TABLET | Freq: Every day | ORAL | Status: DC
  Administered 2015-06-02: 12.5 mg via ORAL
  Filled 2015-06-01: qty 1

## 2015-06-01 MED ORDER — SODIUM CHLORIDE 0.9 % WEIGHT BASED INFUSION
1.0000 mL/kg/h | INTRAVENOUS | Status: DC
  Administered 2015-06-02: 1 mL/kg/h via INTRAVENOUS

## 2015-06-01 MED ORDER — SODIUM CHLORIDE 0.9 % IJ SOLN: 3.0000 mL | INTRAMUSCULAR | Status: DC | PRN

## 2015-06-01 MED ORDER — CLOPIDOGREL BISULFATE 75 MG PO TABS
75.0000 mg | ORAL_TABLET | Freq: Every day | ORAL | Status: DC
  Administered 2015-06-02: 75 mg via ORAL
  Filled 2015-06-01: qty 1

## 2015-06-01 MED ORDER — ATORVASTATIN CALCIUM 10 MG PO TABS
10.0000 mg | ORAL_TABLET | Freq: Every evening | ORAL | Status: DC
  Administered 2015-06-01: 10 mg via ORAL
  Filled 2015-06-01: qty 1

## 2015-06-01 MED ORDER — NITROGLYCERIN 0.4 MG SL SUBL: 0.4000 mg | SUBLINGUAL_TABLET | SUBLINGUAL | Status: DC | PRN

## 2015-06-01 MED ORDER — BUPROPION HCL ER (XL) 150 MG PO TB24
150.0000 mg | ORAL_TABLET | Freq: Every day | ORAL | Status: DC
  Administered 2015-06-02: 150 mg via ORAL
  Filled 2015-06-01: qty 1

## 2015-06-01 MED ORDER — SODIUM CHLORIDE 0.9 % IJ SOLN
3.0000 mL | Freq: Two times a day (BID) | INTRAMUSCULAR | Status: DC
Start: 2015-06-01 — End: 2015-06-02

## 2015-06-01 MED ORDER — ISOSORBIDE MONONITRATE ER 30 MG PO TB24
30.0000 mg | ORAL_TABLET | Freq: Every day | ORAL | Status: DC
  Administered 2015-06-02: 30 mg via ORAL
  Filled 2015-06-01: qty 1

## 2015-06-01 MED ORDER — TRAMADOL HCL 50 MG PO TABS: 50.0000 mg | ORAL_TABLET | Freq: Three times a day (TID) | ORAL | Status: DC | PRN

## 2015-06-01 MED ORDER — ALPRAZOLAM 0.25 MG PO TABS
0.2500 mg | ORAL_TABLET | Freq: Once | ORAL | Status: AC
  Administered 2015-06-02: 0.25 mg via ORAL
  Filled 2015-06-01: qty 1

## 2015-06-01 MED ORDER — ASPIRIN 81 MG PO CHEW
324.0000 mg | CHEWABLE_TABLET | Freq: Once | ORAL | Status: AC
  Administered 2015-06-01: 324 mg via ORAL
  Filled 2015-06-01: qty 4

## 2015-06-01 MED ORDER — ONDANSETRON HCL 4 MG/2ML IJ SOLN: 4.0000 mg | Freq: Four times a day (QID) | INTRAMUSCULAR | Status: DC | PRN

## 2015-06-01 MED ORDER — ASPIRIN EC 81 MG PO TBEC
81.0000 mg | DELAYED_RELEASE_TABLET | Freq: Every day | ORAL | Status: DC
  Administered 2015-06-02: 81 mg via ORAL
  Filled 2015-06-01: qty 1

## 2015-06-01 MED ORDER — ACETAMINOPHEN 325 MG PO TABS: 650.0000 mg | ORAL_TABLET | ORAL | Status: DC | PRN

## 2015-06-01 MED ORDER — SODIUM CHLORIDE 0.9 % IV SOLN: 250.0000 mL | INTRAVENOUS | Status: DC | PRN

## 2015-06-01 MED ORDER — SODIUM CHLORIDE 0.9 % WEIGHT BASED INFUSION
3.0000 mL/kg/h | INTRAVENOUS | Status: DC
  Administered 2015-06-02: 3 mL/kg/h via INTRAVENOUS

## 2015-06-01 NOTE — ED Provider Notes (Signed)
CSN: [id number]     Arrival date & time 06/01/15  1456 History   First MD Initiated Contact with Patient 06/01/15 1540     Chief Complaint  Patient presents with  . Chest Pain     (Consider location/radiation/quality/duration/timing/severity/associated sxs/prior Treatment) HPI Comments: Pt is a 59 year old male, he has a history of coronary artery disease status post stenting, history of atrial fibrillation on Plavix, Imdur, Toprol and uses nitroglycerin as needed. He reports that over the last several days he has had to use nitroglycerin which is unusual for him. He has frequent chest heaviness and pressure for which she was given the Imdur, despite that while he has been working in the yard the last several days he has had increased episodes of chest pain and difficulty breathing which seems to improve when he sits down. On Friday the patient had to use a dose of nitroglycerin because of chest pressure which improved his symptoms. On Saturday the patient's symptoms got much worse while he was working in the yard, he sat down and the next thing he remembers there were people standing over him calling 911. The neighbors reported that he was unconscious, he had complained of some chest heaviness during that episode. At this time the patient's symptoms are mild, he has no fevers chills nausea vomiting, but does have a mild nonproductive cough and swelling of his bilateral lower extremities which appears worse than normal, left greater than right which is baseline for him.  The patient's cardiologist was called today by the family members, they recommended that he come to ED for evaluation.  '  Cardiology:  Dr. Burt Knack Imdur Dose: 30mg  daily  Patient is a 59 y.o. male presenting with chest pain. The history is provided by the patient.  Chest Pain   Past Medical History  Diagnosis Date  . GERD (gastroesophageal reflux disease)   . Psoriasis   . Frequent headaches   . Facial cellulitis 2014   Periorbital  . Coronary artery disease     a. Evaluated by Dr. Stanford Breed 2012 - nonobstructive 2007-2008 by cath. b. Abnormal stress test 10/2014 but cath deferred temporarily due to thrombocytopenia. c. 10/2014: readm with AF-RVR/NSTEMI - s/p BMS to mLCx 11/04/14 (mod dz in RCA). ;  d.  Lexiscan Myoview:  Inf and inf-lat defect with partial reversibility suggesting ischemia, EF 43%; difficult study; Intermediate Risk   . Back pain, lumbosacral 2014  . Thrombocytopenia     a. Onset unclear, but noted 2012. ? 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 s/p prednisone, IVIG.  Marland Kitchen Hyperlipidemia   . Obesity   . Elevated LFTs   . Fatty liver US - 2012  . Atrial fibrillation     a. Episode 10/2014 at time of NSTEMI, spont converted to NSR. Observation for further episodes (not on anticoag at present time due to [facility] = 1, thrombocytopenia).  . Sinus bradycardia   . OSA (obstructive sleep apnea)     severe with AHI 56/hr with successful CPAP titration to 18cm H2O   Past Surgical History  Procedure Laterality Date  . Tonsillectomy  1960's  . Cholecystectomy  2007  . Knee arthroplasty  1970's  . Facial cosmetic surgery  2014  . Cardiac catheterization    . Left heart catheterization with coronary angiogram N/A 11/04/2014    Procedure: LEFT HEART CATHETERIZATION WITH CORONARY ANGIOGRAM;  Surgeon: Jettie Booze, MD;  Location[facility] CATH LAB;  Service: Cardiovascular;  Laterality: N/A;  .  Left heart catheterization with coronary angiogram N/A 01/28/2015    Procedure: LEFT HEART CATHETERIZATION WITH CORONARY ANGIOGRAM;  Surgeon: Sherren Mocha, MD;  Location: [facility] CATH LAB;  Service: Cardiovascular;  Laterality: N/A;   Family History  Problem Relation Age of Onset  . Arthritis Maternal Grandmother   . Heart disease Sister   . Diabetes Sister   . Cancer - Ovarian Sister   . Heart disease      Maternal/paternal grandparents  . Cirrhosis Mother   . Heart attack  Mother   . Heart attack Maternal Aunt   . Hypertension Mother   . Hypertension Father   . Stroke Father    History  Substance Use Topics  . Smoking status: Current Some Day Smoker -- 0.20 packs/day for 43 years    Types: Cigarettes  . Smokeless tobacco: Never Used     Comment: 11/28/2012 "chew occasionally"  Quit for 2.5 months in early 2016  . Alcohol Use: 0.0 oz/week    0 Standard drinks or equivalent per week     Comment: 11/28/2012 "beer q 6 months or so"    Review of Systems  Cardiovascular: Positive for chest pain.  All other systems reviewed and are negative.     Allergies  Review of patient's allergies indicates no known allergies.  Home Medications   Prior to Admission medications   Medication Sig Start Date End Date Taking? Authorizing Provider  aspirin 81 MG tablet Take 1 tablet (81 mg total) by mouth daily. 10/31/14  Yes Dayna N Dunn, PA-C  atorvastatin (LIPITOR) 10 MG tablet Take 1 tablet (10 mg total) by mouth every evening. 12/16/14  Yes Sherren Mocha, MD  buPROPion (WELLBUTRIN XL) 150 MG 24 hr tablet Take 1-2 tablets (150-300 mg total) by mouth daily. Take once daily for 7 days then 2 daily. Patient taking differently: Take 150 mg by mouth daily.  04/23/15  Yes Zenia Resides, MD  clopidogrel (PLAVIX) 75 MG tablet Take 75 mg by mouth daily.   Yes Historical Provider, MD  diclofenac (VOLTAREN) 75 MG EC tablet Take 1 tablet (75 mg total) by mouth 2 (two) times daily. Patient taking differently: Take 75 mg by mouth as needed for moderate pain.  03/05/15  Yes Edward Saguier, PA-C  furosemide (LASIX) 20 MG tablet Take 20 mg by mouth daily.   Yes Historical Provider, MD  isosorbide mononitrate (IMDUR) 30 MG 24 hr tablet Take 1 tablet (30 mg total) by mouth daily. 01/23/15  Yes Sherren Mocha, MD  metoprolol succinate (TOPROL-XL) 25 MG 24 hr tablet Take 0.5 tablets (12.5 mg total) by mouth daily. 02/26/15  Yes Sherren Mocha, MD  Multiple Vitamin (MULTIVITAMIN WITH MINERALS)  TABS tablet Take 1 tablet by mouth daily.   Yes Historical Provider, MD  nitroGLYCERIN (NITROSTAT) 0.4 MG SL tablet Place 1 tablet (0.4 mg total) under the tongue every 5 (five) minutes as needed for chest pain (up to 3 doses). 10/31/14  Yes Dayna N Dunn, PA-C  pantoprazole (PROTONIX) 40 MG tablet Take 1 tablet (40 mg total) by mouth daily. 10/31/14  Yes Dayna N Dunn, PA-C  doxycycline (VIBRA-TABS) 100 MG tablet Take 1 tablet (100 mg total) by mouth 2 (two) times daily. 04/08/15   Rosemarie Ax, MD  meloxicam (MOBIC) 15 MG tablet Take 1 tablet (15 mg total) by mouth daily. 03/10/15   April Palumbo, MD  traMADol (ULTRAM) 50 MG tablet Take 1 tablet (50 mg total) by mouth every 8 (eight) hours as needed. 04/23/15  Rosemarie Ax, MD   BP 109/64 mmHg  Pulse 70  Temp(Src) 98.3 F (36.8 C) (Oral)  Resp 15  Ht 6\' 3"  (1.905 m)  Wt 290 lb (131.543 kg)  BMI 36.25 kg/m2  SpO2 95% Physical Exam  Constitutional: He appears well-developed and well-nourished. No distress.  HENT:  Head: Normocephalic and atraumatic.  Mouth/Throat: Oropharynx is clear and moist. No oropharyngeal exudate.  Eyes: Conjunctivae and EOM are normal. Pupils are equal, round, and reactive to light. Right eye exhibits no discharge. Left eye exhibits no discharge. No scleral icterus.  Neck: Normal range of motion. Neck supple. No JVD present. No thyromegaly present.  Cardiovascular: Normal rate, regular rhythm, normal heart sounds and intact distal pulses.  Exam reveals no gallop and no friction rub.   No murmur heard. Pulmonary/Chest: Effort normal and breath sounds normal. No respiratory distress. He has no wheezes. He has no rales.  Abdominal: Soft. Bowel sounds are normal. He exhibits no distension and no mass. There is no tenderness.  Musculoskeletal: Normal range of motion. He exhibits edema ( Left greater than right pitting edema of the lower extremities below the knee). He exhibits no tenderness.  Lymphadenopathy:    He  has no cervical adenopathy.  Neurological: He is alert. Coordination normal.  Skin: Skin is warm and dry. No rash noted. No erythema.  Psychiatric: He has a normal mood and affect. His behavior is normal.  Nursing note and vitals reviewed.   ED Course  Procedures (including critical care time) Labs Review Labs Reviewed  CBC - Abnormal; Notable for the following:    WBC 3.9 (*)    RBC 3.94 (*)    Hemoglobin 12.4 (*)    HCT 35.8 (*)    All other components within normal limits  BASIC METABOLIC PANEL  BRAIN NATRIURETIC PEPTIDE  I-STAT TROPOININ, ED    Imaging Review No results found.   EKG Interpretation   Date/Time:  Monday June 01 2015 15:00:05 EDT Ventricular Rate:  76 PR Interval:  84 QRS Duration: 90 QT Interval:  396 QTC Calculation: 445 R Axis:   58 Text Interpretation:  Sinus rhythm with short PR Otherwise normal ECG  since last tracing no significant change Confirmed by Valmore Arabie  MD, Merril Nagy  [phone]) on 06/01/2015 3:41:19 PM      MDM   Final diagnoses:  SOB (shortness of breath)  Chest pain, unspecified chest pain type    At this time the patient has an unremarkable EKG, normal heart and lung sounds, labs are pending  3:50 PM, troponin negative, blood counts are unremarkable, known to have some leukopenia.  Plan is to discuss with cardiology and anticipate admission.  Discussed with cardiology physician assistant, they will come to see the patient. Anticipate admission.  Noemi Chapel, MD 06/01/15 [phone]

## 2015-06-01 NOTE — H&P (Signed)
Chief Complaint:  Unstable Angina  Cardiologist: Burt Knack  HPI:  This is a 59 y.o. 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 He presented with recurrent complaints in March 2016 and cardiac catheterization showed a widely patent left circumflex stent and unchanged 40-50 percent stenosis in the midright coronary artery.  He now presents with symptoms highly suggestive of unstable angina. He has noticed that his exercise tolerance has diminished. He has to stop sooner and sooner when working in the yard and has been taking sublingual nitroglycerin repeatedly over the last week. This is despite the fact that he is also taking isosorbide mononitrate. The chest discomfort is identical in location and quality with his symptoms from last December, albeit not as severe.  2 days ago he felt very weak and hot after working in his yard and sat down. He was then found slumped over by his neighbors who called 911. He does not recall actually losing consciousness but does recall feeling very hot, flushed and diaphoretic. He tried to drink water and immediately vomited. Despite the recommendations of emergency medical services she did not allow them to take him to the hospital. He called Dr. Antionette Char office today and was advised to come to the emergency room. He is currently free of chest discomfort and has not had any further episodes of syncope.  He did have brief atrial fibrillation with rapid ventricular response in the setting of the non-STEMI in December, without clinically evident recurrence since.  He has chronic moderate thrombocytopenia with a typical platelet count around 45,000, felt to be autoimmune in etiology and followed in the oncology  clinic by Dr. Alvy Bimler. He also has psoriasis and fatty liver disease. The presence of thrombocytopenia led to the decision to place a bare-metal stent last December, but he has not had any bleeding problems on dual antiplatelets therapy with aspirin and clopidogrel over the last 6 months.  He has cut "way back" on his smoking but continues to smoke a handful of cigarettes every day and seems to be pessimistic that he will be able to quit completely.  His review of systems is also significant for fatigue which has been persistent for weeks. His fatigue improved after initiation of treatment with CPAP for obstructive sleep apnea last year, but has now recurred.  PMHx:  Past Medical History  Diagnosis Date  . GERD (gastroesophageal reflux disease)   . Psoriasis   . Frequent headaches   . Facial cellulitis 2014    Periorbital  . Coronary artery disease     a. Evaluated by Dr. Stanford Breed 2012 - nonobstructive 2007-2008 by cath. b. Abnormal stress test 10/2014 but cath deferred temporarily due to thrombocytopenia. c. 10/2014: readm with AF-RVR/NSTEMI - s/p BMS to mLCx 11/04/14 (mod dz in RCA). ;  d.  Lexiscan Myoview:  Inf and inf-lat defect with partial reversibility suggesting ischemia, EF 43%; difficult study; Intermediate Risk   . Back pain, lumbosacral 2014  . Thrombocytopenia     a. Onset unclear, but noted 2012. ? 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 s/p prednisone, IVIG.  Marland Kitchen Hyperlipidemia   . Obesity   . Elevated LFTs   . Fatty liver US - 2012  . Atrial fibrillation     a. Episode 10/2014 at  time of NSTEMI, spont converted to NSR. Observation for further episodes (not on anticoag at present time due to [facility] = 1, thrombocytopenia).  . Sinus bradycardia   . OSA (obstructive sleep apnea)     severe with AHI 56/hr with successful CPAP titration to 18cm H2O    Past Surgical History  Procedure Laterality Date  . Tonsillectomy  1960's  .  Cholecystectomy  2007  . Knee arthroplasty  1970's  . Facial cosmetic surgery  2014  . Cardiac catheterization    . Left heart catheterization with coronary angiogram N/A 11/04/2014    Procedure: LEFT HEART CATHETERIZATION WITH CORONARY ANGIOGRAM;  Surgeon: Jettie Booze, MD;  Location: [facility] CATH LAB;  Service: Cardiovascular;  Laterality: N/A;  . Left heart catheterization with coronary angiogram N/A 01/28/2015    Procedure: LEFT HEART CATHETERIZATION WITH CORONARY ANGIOGRAM;  Surgeon: Sherren Mocha, MD;  Location: [facility] CATH LAB;  Service: Cardiovascular;  Laterality: N/A;    FAMHx:  Family History  Problem Relation Age of Onset  . Arthritis Maternal Grandmother   . Heart disease Sister   . Diabetes Sister   . Cancer - Ovarian Sister   . Heart disease      Maternal/paternal grandparents  . Cirrhosis Mother   . Heart attack Mother   . Heart attack Maternal Aunt   . Hypertension Mother   . Hypertension Father   . Stroke Father     SOCHx:   reports that he has been smoking Cigarettes.  He has a 8.6 pack-year smoking history. He has never used smokeless tobacco. He reports that he drinks alcohol. He reports that he does not use illicit drugs.  ALLERGIES:  No Known Allergies  ROS: Pertinent items are noted in HPI. A comprehensive review of systems was negative.  HOME MEDS: No current facility-administered medications on file prior to encounter.   Current Outpatient Prescriptions on File Prior to Encounter  Medication Sig Dispense Refill  . aspirin 81 MG tablet Take 1 tablet (81 mg total) by mouth daily.    Marland Kitchen atorvastatin (LIPITOR) 10 MG tablet Take 1 tablet (10 mg total) by mouth every evening. 90 tablet 3  . buPROPion (WELLBUTRIN XL) 150 MG 24 hr tablet Take 1-2 tablets (150-300 mg total) by mouth daily. Take once daily for 7 days then 2 daily. (Patient taking differently: Take 150 mg by mouth daily. ) 60 tablet 2  . diclofenac (VOLTAREN) 75 MG EC tablet Take 1 tablet (75  mg total) by mouth 2 (two) times daily. (Patient taking differently: Take 75 mg by mouth as needed for moderate pain. ) 30 tablet 0  . isosorbide mononitrate (IMDUR) 30 MG 24 hr tablet Take 1 tablet (30 mg total) by mouth daily. 30 tablet 11  . metoprolol succinate (TOPROL-XL) 25 MG 24 hr tablet Take 0.5 tablets (12.5 mg total) by mouth daily. 30 tablet 11  . Multiple Vitamin (MULTIVITAMIN WITH MINERALS) TABS tablet Take 1 tablet by mouth daily.    . nitroGLYCERIN (NITROSTAT) 0.4 MG SL tablet Place 1 tablet (0.4 mg total) under the tongue every 5 (five) minutes as needed for chest pain (up to 3 doses). 25 tablet 3  . pantoprazole (PROTONIX) 40 MG tablet Take 1 tablet (40 mg total) by mouth daily. 30 tablet 1  . doxycycline (VIBRA-TABS) 100 MG tablet Take 1 tablet (100 mg total) by mouth 2 (two) times daily. 28 tablet 0  . meloxicam (MOBIC) 15 MG tablet Take 1 tablet (15 mg total) by mouth daily.  10 tablet 0  . traMADol (ULTRAM) 50 MG tablet Take 1 tablet (50 mg total) by mouth every 8 (eight) hours as needed. 24 tablet 0    LABS/IMAGING: Results for orders placed or performed during the hospital encounter of 06/01/15 (from the past 48 hour(s))  Basic metabolic panel     Status: None   Collection Time: 06/01/15  3:27 PM  Result Value Ref Range   Sodium 138 135 - 145 mmol/L   Potassium 4.0 3.5 - 5.1 mmol/L   Chloride 106 101 - 111 mmol/L   CO2 27 22 - 32 mmol/L   Glucose, Bld 97 65 - 99 mg/dL   BUN 9 6 - 20 mg/dL   Creatinine, Ser 0.97 0.61 - 1.24 mg/dL   Calcium 9.1 8.9 - 10.3 mg/dL   GFR calc non Af Amer >60 >60 mL/min   GFR calc Af Amer >60 >60 mL/min    Comment: (NOTE) The eGFR has been calculated using the CKD EPI equation. This calculation has not been validated in all clinical situations. eGFR's persistently <60 mL/min signify possible Chronic Kidney Disease.    Anion gap 5 5 - 15  CBC     Status: Abnormal   Collection Time: 06/01/15  3:27 PM  Result Value Ref Range   WBC 3.9  (L) 4.0 - 10.5 K/uL   RBC 3.94 (L) 4.22 - 5.81 MIL/uL   Hemoglobin 12.4 (L) 13.0 - 17.0 g/dL   HCT 35.8 (L) 39.0 - 52.0 %   MCV 90.9 78.0 - 100.0 fL   MCH 31.5 26.0 - 34.0 pg   MCHC 34.6 30.0 - 36.0 g/dL   RDW 14.4 11.5 - 15.5 %   Platelets 52 (L) 150 - 400 K/uL    Comment: REPEATED TO VERIFY SPECIMEN CHECKED FOR CLOTS PLATELETS APPEAR DECREASED PLATELET COUNT CONFIRMED BY SMEAR   Brain natriuretic peptide     Status: Abnormal   Collection Time: 06/01/15  3:27 PM  Result Value Ref Range   B Natriuretic Peptide 148.6 (H) 0.0 - 100.0 pg/mL  I-stat troponin, ED     Status: None   Collection Time: 06/01/15  3:33 PM  Result Value Ref Range   Troponin i, poc 0.00 0.00 - 0.08 ng/mL   Comment 3            Comment: Due to the release kinetics of cTnI, a negative result within the first hours of the onset of symptoms does not rule out myocardial infarction with certainty. If myocardial infarction is still suspected, repeat the test at appropriate intervals.    Dg Chest Port 1 View  06/01/2015   CLINICAL DATA:  Syncope.  EXAM: PORTABLE CHEST - 1 VIEW  COMPARISON:  11/03/2014  FINDINGS: Stable appearance of pulmonary venous hypertension without overt pulmonary edema. No focal infiltrate or pleural effusion is identified. The heart size is at the upper limits of normal. No pneumothorax.  IMPRESSION: Pulmonary venous hypertensive changes without overt pulmonary edema.   Electronically Signed   By: Aletta Edouard M.D.   On: 06/01/2015 16:31    VITALS: Blood pressure 117/55, pulse 62, temperature 98.3 F (36.8 C), temperature source Oral, resp. rate 13, height '6\' 3"'  (1.905 m), weight 290 lb (131.543 kg), SpO2 97 %.  EXAM:  General: Alert, oriented x3, no distress Head: no evidence of trauma, PERRL, EOMI, no exophtalmos or lid lag, no myxedema, no xanthelasma; normal ears, nose and oropharynx Neck: normal jugular venous pulsations and no hepatojugular reflux; brisk carotid pulses  without  delay and no carotid bruits Chest: clear to auscultation, no signs of consolidation by percussion or palpation, normal fremitus, symmetrical and full respiratory excursions Cardiovascular: normal position and quality of the apical impulse, regular rhythm, normal first heart sound and normal second heart sounds, no rubs or gallops, no murmur Abdomen: no tenderness or distention, no masses by palpation, no abnormal pulsatility or arterial bruits, normal bowel sounds, no hepatosplenomegaly Extremities: no clubbing, cyanosis or edema; 2+ radial, ulnar and brachial pulses bilaterally; 2+ right femoral, posterior tibial and dorsalis pedis pulses; 2+ left femoral, posterior tibial and dorsalis pedis pulses; no subclavian or femoral bruits Neurological: grossly nonfocal   ECG Normal sinus rhythm with minor intraventricular conduction delay and no significant repolarization abnormalities  Cardiac catheterization 01/28/2015: Final Conclusions:  1. Mild nonobstructive coronary artery disease with 40-50% stenosis of the right coronary artery, minor LAD stenosis, and continued patency of the stented segment the left circumflex with mild in-stent restenosis.  2. Normal LV systolic function with normal LVEDP. Recommendations: Continue current medical therapy.   IMPRESSION:  1. Unstable angina pectoris, with timing highly suggestive of in-stent restenosis in a patient who received a bare-metal stent. He has been compliant with dual antiplatelets therapy. Despite low risk ECG and biochemical markers, his symptoms are quite compelling and I have recommended that he undergo repeat coronary angiography and repeat percutaneous revascularization if necessary. He has done very well with dual antiplatelets therapy, without any bleeding complications. I think if it is felt that a drug-eluting stent is better suited for his anatomy, he should receive it.  2. Ongoing tobacco abuse, strongly encouraged to quit smoking  completely and permanently.  3. Chronic autoimmune thrombocytopenia, stable and better than his usual baseline today. No bleeding complications on dual antiplatelets therapy for the last 6 months.  4. Syncope, the description of prodromal symptoms and his recovery are more suggestive of hypotensive syncope/vasovagal event rather than true arrhythmia. I suspect that chronic treatment with beta blockers and nitrates makes him more susceptible to hypotension when working outside in heat. Ventricular arrhythmia is a possibility, but I feel it is less likely. Monitor on telemetry while here.  5. History of paroxysmal atrial fibrillation during acute coronary insufficiency/non-STEMI. He has not had other events and is therefore not on anticoagulation therapy at this time.  6. Fatigue. This complaint sounds more consistent with insufficiently treated obstructive sleep apnea than with coronary problems. Beta blockers may also be contributing, but he is currently on a very low dose of metoprolol   PLAN: Recommend repeat left heart catheterization with coronary angiography and possible percutaneous revascularization/stent placement. This procedure has been fully reviewed with the patient and written informed consent has been obtained.  Tentatively scheduled for 1500 hrs. with Dr. Irish Lack tomorrow, who also performed his stent placement in December 2015.   Sanda Klein, MD, [facility] [phone] office [phone] pager  06/01/2015, 4:59 PM

## 2015-06-01 NOTE — Telephone Encounter (Signed)
Message received  Friday chest pressure, nitro helped  Saturday out in the yard, neighbor found him conscious on the ground.  EMS called thought he got hot.  Also gave him a nitro for his chest pressure  Chest pressure was worse out working in yard  Sunday, developed pressure again and took nitro with some relief  This started about a week ago after lifting he felt chest pressure and shortness of breath.  Said that he has been under a lot of stress  Per L. Gerherdt  Tell patient to go to the ED for futher evaluation and treatment

## 2015-06-01 NOTE — ED Notes (Addendum)
Pt reports he was working outside yesterday and he began to have chest tightness, hard to breathe, he sat down to rest and then the neighbor called 911. Ems gave him 1 nitro which relieved the pain some. He refused transport to the hospital at that time. He continued to have tightness through the night. He denies nausea now but continues to feel SOB. He states he was unable to eat last night.  A&Ox4, resp e/u

## 2015-06-01 NOTE — Telephone Encounter (Signed)
New problem   Pt's wife stated pt is having heaviness in his chest that isn't chest pain. Pt took nitro Friday, Saturday, and ems was called, pt is also feeling tired. Pt is at home and pt's wife is at work calling. Please call wife at work. Or call on cell if after 2pm [phone]

## 2015-06-02 ENCOUNTER — Encounter (HOSPITAL_COMMUNITY)
Admission: EM | Disposition: A | Discharge status: Home / Self Care | Payer: Self-pay | Source: Home / Self Care | Attending: Emergency Medicine

## 2015-06-02 ENCOUNTER — Encounter (HOSPITAL_COMMUNITY): Payer: Self-pay | Admitting: *Deleted

## 2015-06-02 DIAGNOSIS — I4891 Unspecified atrial fibrillation: Secondary | ICD-10-CM

## 2015-06-02 DIAGNOSIS — I25110 Atherosclerotic heart disease of native coronary artery with unstable angina pectoris: Principal | ICD-10-CM

## 2015-06-02 DIAGNOSIS — R079 Chest pain, unspecified: Secondary | ICD-10-CM

## 2015-06-02 DIAGNOSIS — I25119 Atherosclerotic heart disease of native coronary artery with unspecified angina pectoris: Secondary | ICD-10-CM | POA: Diagnosis not present

## 2015-06-02 DIAGNOSIS — G4733 Obstructive sleep apnea (adult) (pediatric): Secondary | ICD-10-CM | POA: Diagnosis not present

## 2015-06-02 HISTORY — PX: CARDIAC CATHETERIZATION: SHX172

## 2015-06-02 LAB — CBC
HCT: 34.8 % — ABNORMAL LOW (ref 39.0–52.0)
Hemoglobin: 11.8 g/dL — ABNORMAL LOW (ref 13.0–17.0)
MCH: 31 pg (ref 26.0–34.0)
MCHC: 33.9 g/dL (ref 30.0–36.0)
MCV: 91.3 fL (ref 78.0–100.0)
Platelets: 45 K/uL — ABNORMAL LOW (ref 150–400)
RBC: 3.81 MIL/uL — ABNORMAL LOW (ref 4.22–5.81)
RDW: 14.4 % (ref 11.5–15.5)
WBC: 2.4 K/uL — ABNORMAL LOW (ref 4.0–10.5)

## 2015-06-02 LAB — LIPID PANEL
CHOL/HDL RATIO: 5.8 ratio
CHOLESTEROL: 157 mg/dL (ref 0–200)
HDL: 27 mg/dL — ABNORMAL LOW (ref >40)
LDL Cholesterol: 115 mg/dL — ABNORMAL HIGH (ref 0–99)
Triglycerides: 75 mg/dL (ref <150)
VLDL: 15 mg/dL (ref 0–40)

## 2015-06-02 LAB — TROPONIN I: Troponin I: <0.03 ng/mL (ref <0.031)

## 2015-06-02 LAB — PROTIME-INR
INR: 1.35 (ref 0.00–1.49)
Prothrombin Time: 16.8 s — ABNORMAL HIGH (ref 11.6–15.2)

## 2015-06-02 SURGERY — LEFT HEART CATH AND CORONARY ANGIOGRAPHY

## 2015-06-02 MED ORDER — MIDAZOLAM HCL 2 MG/2ML IJ SOLN
INTRAMUSCULAR | Status: AC
  Filled 2015-06-02: qty 2

## 2015-06-02 MED ORDER — HEPARIN SODIUM (PORCINE) 1000 UNIT/ML IJ SOLN
INTRAMUSCULAR | Status: AC
  Filled 2015-06-02: qty 1

## 2015-06-02 MED ORDER — SODIUM CHLORIDE 0.9 % IV SOLN: 250.0000 mL | INTRAVENOUS | Status: DC | PRN

## 2015-06-02 MED ORDER — SODIUM CHLORIDE 0.9 % IJ SOLN: 3.0000 mL | Freq: Two times a day (BID) | INTRAMUSCULAR | Status: DC

## 2015-06-02 MED ORDER — FENTANYL CITRATE (PF) 100 MCG/2ML IJ SOLN
INTRAMUSCULAR | Status: DC | PRN
  Administered 2015-06-02: 25 ug via INTRAVENOUS

## 2015-06-02 MED ORDER — IOHEXOL 350 MG/ML SOLN
INTRAVENOUS | Status: DC | PRN
  Administered 2015-06-02: 90 mL via INTRAVENOUS

## 2015-06-02 MED ORDER — HEPARIN SODIUM (PORCINE) 1000 UNIT/ML IJ SOLN
INTRAMUSCULAR | Status: DC | PRN
  Administered 2015-06-02: 6000 [IU] via INTRAVENOUS

## 2015-06-02 MED ORDER — HEPARIN (PORCINE) IN NACL 2-0.9 UNIT/ML-% IJ SOLN
INTRAMUSCULAR | Status: AC
  Filled 2015-06-02: qty 1500

## 2015-06-02 MED ORDER — LIDOCAINE HCL (PF) 1 % IJ SOLN
INTRAMUSCULAR | Status: AC
  Filled 2015-06-02: qty 30

## 2015-06-02 MED ORDER — VERAPAMIL HCL 2.5 MG/ML IV SOLN
INTRAVENOUS | Status: DC | PRN
  Administered 2015-06-02: 09:00:00 via INTRA_ARTERIAL

## 2015-06-02 MED ORDER — MIDAZOLAM HCL 2 MG/2ML IJ SOLN
INTRAMUSCULAR | Status: DC | PRN
  Administered 2015-06-02: 2 mg via INTRAVENOUS

## 2015-06-02 MED ORDER — VERAPAMIL HCL 2.5 MG/ML IV SOLN
INTRAVENOUS | Status: AC
Start: 2015-06-02 — End: 2015-06-02
  Filled 2015-06-02: qty 2

## 2015-06-02 MED ORDER — FENTANYL CITRATE (PF) 100 MCG/2ML IJ SOLN
INTRAMUSCULAR | Status: AC
  Filled 2015-06-02: qty 2

## 2015-06-02 MED ORDER — ACETAMINOPHEN 325 MG PO TABS: 650.0000 mg | ORAL_TABLET | ORAL | Status: DC | PRN

## 2015-06-02 MED ORDER — SODIUM CHLORIDE 0.9 % IJ SOLN: 3.0000 mL | INTRAMUSCULAR | Status: DC | PRN

## 2015-06-02 MED ORDER — ONDANSETRON HCL 4 MG/2ML IJ SOLN: 4.0000 mg | Freq: Four times a day (QID) | INTRAMUSCULAR | Status: DC | PRN

## 2015-06-02 MED ORDER — SODIUM CHLORIDE 0.9 % WEIGHT BASED INFUSION
1.0000 mL/kg/h | INTRAVENOUS | Status: AC
  Administered 2015-06-02: 1 mL/kg/h via INTRAVENOUS

## 2015-06-02 MED ORDER — LIDOCAINE HCL (PF) 1 % IJ SOLN
INTRAMUSCULAR | Status: DC | PRN
  Administered 2015-06-02: 09:00:00

## 2015-06-02 SURGICAL SUPPLY — 11 items

## 2015-06-02 NOTE — Interval H&P Note (Signed)
Cath Lab Visit (complete for each Cath Lab visit)  Clinical Evaluation Leading to the Procedure:   ACS: Yes.    Non-ACS:    Anginal Classification: CCS IV  Anti-ischemic medical therapy: Minimal Therapy (1 class of medications)  Non-Invasive Test Results: No non-invasive testing performed  Prior CABG: No previous CABG  TIMI SCORE  Patient Information:  TIMI Score is 3  UA/NSTEMI and intermediate-risk features (e.g., TIMI score 3?4) for short-term risk of death or nonfatal MI  Revascularization of the presumed culprit artery   A (9)  Indication: 10; Score: 9     History and Physical Interval Note:  06/02/2015 8:55 AM  Vincent Ochoa  has presented today for surgery, with the diagnosis of unstable angina  The various methods of treatment have been discussed with the patient and family. After consideration of risks, benefits and other options for treatment, the patient has consented to  Procedure(s): Left Heart Cath and Coronary Angiography (N/A) as a surgical intervention .  The patient's history has been reviewed, patient examined, no change in status, stable for surgery.  I have reviewed the patient's chart and labs.  Questions were answered to the patient's satisfaction.     Vincent Ochoa S.

## 2015-06-02 NOTE — Progress Notes (Signed)
Patient Name: Vincent Ochoa Date of Encounter: 06/02/2015  Primary: Dr. Burt Knack   Principal Problem:   Unstable angina pectoris Active Problems:   CAD (coronary artery disease)   Psoriasis   GERD (gastroesophageal reflux disease)   Thrombocytopenia   Paroxysmal atrial fibrillation   Hyperlipidemia LDL goal <70   Tobacco abuse   OSA (obstructive sleep apnea)    SUBJECTIVE  Denies any CP or SOB overnight. States he has been noting exertional symptoms for a few days   CURRENT MEDS . aspirin EC  81 mg Oral Daily  . atorvastatin  10 mg Oral QPM  . buPROPion  150 mg Oral Daily  . clopidogrel  75 mg Oral Daily  . isosorbide mononitrate  30 mg Oral Daily  . metoprolol succinate  12.5 mg Oral Daily  . pantoprazole  40 mg Oral Daily  . sodium chloride  3 mL Intravenous Q12H    OBJECTIVE  Filed Vitals:   06/01/15 1800 06/01/15 1853 06/01/15 2100 06/02/15 0608  BP: 123/68 126/65 131/61 124/69  Pulse: 61 65 63   Temp:  98.9 F (37.2 C) 97.9 F (36.6 C) 98 F (36.7 C)  TempSrc:  Oral  Oral  Resp: 21 20 18 15   Height:  6\' 3"  (1.905 m)    Weight:  289 lb 14.5 oz (131.5 kg)  290 lb 6.4 oz (131.725 kg)  SpO2: 99% 98% 98% 97%    Intake/Output Summary (Last 24 hours) at 06/02/15 0800 Last data filed at 06/02/15 5284  Gross per 24 hour  Intake    720 ml  Output      0 ml  Net    720 ml   Filed Weights   06/01/15 1512 06/01/15 1853 06/02/15 0608  Weight: 290 lb (131.543 kg) 289 lb 14.5 oz (131.5 kg) 290 lb 6.4 oz (131.725 kg)    PHYSICAL EXAM  General: Pleasant, NAD. Neuro: Alert and oriented X 3. Moves all extremities spontaneously. Psych: Normal affect. HEENT:  Normal  Neck: Supple without bruits or JVD. Lungs:  Resp regular and unlabored, CTA. Heart: RRR no s3, s4, or murmurs. Abdomen: Soft, non-tender, non-distended, BS + x 4.  Extremities: No clubbing, cyanosis or edema. DP/PT/Radials 2+ and equal bilaterally.  Accessory Clinical Findings  CBC  Recent  Labs  06/01/15 1527 06/02/15 0635  WBC 3.9* 2.4*  HGB 12.4* 11.8*  HCT 35.8* 34.8*  MCV 90.9 91.3  PLT 52* 45*   Basic Metabolic Panel  Recent Labs  06/01/15 1527  NA 138  K 4.0  CL 106  CO2 27  GLUCOSE 97  BUN 9  CREATININE 0.97  CALCIUM 9.1   Cardiac Enzymes  Recent Labs  06/01/15 1922 06/02/15 0049  TROPONINI <0.03 <0.03    TELE NSR without significant ventricular ectopy    ECG  No new EKG  Echocardiogram 10/29/2014  LV EF: 60% -  65%  ------------------------------------------------------------------- Indications:   Chest pain 786.51.  ------------------------------------------------------------------- History:  PMH:  Dyspnea. Coronary artery disease. Risk factors: Current tobacco use. Dyslipidemia.  ------------------------------------------------------------------- Study Conclusions  - Left ventricle: The cavity size was mildly dilated. Systolic function was normal. The estimated ejection fraction was in the range of 60% to 65%. Wall motion was normal; there were no regional wall motion abnormalities. Features are consistent with a pseudonormal left ventricular filling pattern, with concomitant abnormal relaxation and increased filling pressure (grade 2 diastolic dysfunction). Doppler parameters are consistent with elevated ventricular end-diastolic filling pressure. - Aortic valve: Trileaflet; normal  thickness leaflets. There was no regurgitation. - Aortic root: The aortic root was normal in size. - Mitral valve: Structurally normal valve. - Left atrium: The atrium was moderately dilated. - Right ventricle: Systolic function was normal. - Right atrium: The atrium was mildly dilated. - Tricuspid valve: There was mild regurgitation. - Pulmonic valve: There was no regurgitation. - Pulmonary arteries: Systolic pressure was within the normal range. - Inferior vena cava: The vessel was normal in size. - Pericardium,  extracardiac: There was no pericardial effusion.    Radiology/Studies  Dg Chest Port 1 View  06/01/2015   CLINICAL DATA:  Syncope.  EXAM: PORTABLE CHEST - 1 VIEW  COMPARISON:  11/03/2014  FINDINGS: Stable appearance of pulmonary venous hypertension without overt pulmonary edema. No focal infiltrate or pleural effusion is identified. The heart size is at the upper limits of normal. No pneumothorax.  IMPRESSION: Pulmonary venous hypertensive changes without overt pulmonary edema.   Electronically Signed   By: Aletta Edouard M.D.   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 Last cath Mar 2016 with widely patent LCx stent and unchanged 40-50% stenosis in mid RCA. He presents with unstable angina  1. Unstable angina  - pending cardiac catheterization today, DES if needed as he did quite well on BMS  2. Recent syncope: per Dr. Sallyanne Kuster, likely mediated by hypotensive/vasovagal episode while working outside in heat, continue monitor on telemetry for arrhythmia which is less likely  3. 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 Alvy Bimler  - platelet typically around 45000  4. CAD  5. Hyperlipidemia  6. PAF in the setting of NSTEMI in Dec 2015: plan to observe for further episode, CHA2DS2-Vasc score 1  7. OSA  8. Tobacco abuse: conseled on stopping  Ruben Im Pager: [phone]   Attending Note:   The patient was seen and examined.  Agree with assessment and plan as noted above.  Changes made to the above note as needed.  Cardiac cath today shows minimal in-stent restenosis of the LCx stent. Normal LV systolic function .  He will be able to go home today .   He takes Lasix 10 mg a day . His symptoms sound like he is volume depleted.  I think he could DC the lasix - but he will need to avoid  eating salt .  Follow up with Dr. Burt Knack.    Thayer Headings, Brooke Bonito., MD, [facility] 06/02/2015, 10:47 AM [address]  [place] Pager [phone]

## 2015-06-02 NOTE — Discharge Summary (Signed)
Discharge Summary   Patient ID: Vincent Ochoa,  MRN: [id number], DOB/AGE: 01/27/1956 59 y.o.  Admit date: 06/01/2015 Discharge date: 06/02/2015  Primary Care Provider: Clearance Coots Primary Cardiologist: Dr. Burt Knack  Discharge Diagnoses Principal Problem:   Chest pain Active Problems:   CAD (coronary artery disease)   Psoriasis   GERD (gastroesophageal reflux disease)   Thrombocytopenia   Paroxysmal atrial fibrillation   Hyperlipidemia LDL goal <70   Tobacco abuse   OSA (obstructive sleep apnea)   Allergies No Known Allergies  Procedures  Cardiac catheterization 06/02/2015 Conclusion     Mid Cx lesion, mild instent restenosis, 25%. The lesion was previously treated with a bare metal stent in 12/15.  The left ventricular systolic function is normal.  Nonobstructive coronary artery disease. Mild in-stent restenosis as noted above. Continue medical therapy. Continue attempts at smoking cessation.   Cardiology f/u with Dr. Burt Knack.   Left Heart    Left Ventricle The left ventricular size is normal. The left ventricular systolic function is normal. The left ventricular ejection fraction is 55-65% by visual estimate. There are no wall motion abnormalities in the left ventricle.   Aortic Valve There is no aortic valve stenosis.       Hospital Course  The patient is a 59 year old male with past medical history of tobacco abuse, OSA and history of CAD s/p NSTEMI and BMS to mid LCx in December 2015. He had a relook cardiac catheterization in March 2016 which showed widely patent stent in the left circumflex artery with unchanged 40-50% stenosis in mid RCA. He also has a history of paroxysmal atrial fibrillation in the setting of NSTEMI, given his low CHA2DS2-Vasc score, he was on aspirin. He has chronic thrombocytopenia and has been followed by oncology Dr. Alvy Bimler with platelet chronically at 45,000 which was felt to be autoimmune in etiology.  He presented to [facility] on 06/01/2015 with symptoms concerning for unstable angina. He complained of diminished exercise tolerance in the last week. Has also been taking sublingual nitroglycerin. On presentation to [facility], he complained of chest discomfort that's reminiscent of his previous angina.  Given the concern for unstable angina, it was eventually decided to undergo relook cardiac catheterization. He was admitted to cardiology service. Overnight serial troponin was negative. He underwent diagnostic cath on 7/12 which showed nonobstructive CAD with 20% stenosis in left main, 20-25% stenosis in mid LAD, 25% stenosis in mid left circumflex, and 25% stenosis in the ramus, 30% stenosis in mid RCA, however no culprit lesion was identified. His EF was 55-65% by visual estimate during cath. He is deemed stable for discharge from cardiology perspective post cath given reassuring result. I have arranged follow-up with Dr. Burt Knack.  Since his symptom with presyncope was concerning for dehydration, we have discontinuing his Lasix. He has been recommended to avoid salt and contact cardiology if has any sign of HF.   Discharge Vitals Blood pressure 129/71, pulse 66, temperature 98 F (36.7 C), temperature source Oral, resp. rate 8, height 6\' 3"  (1.905 m), weight 290 lb 6.4 oz (131.725 kg), SpO2 95 %.  Filed Weights   06/01/15 1512 06/01/15 1853 06/02/15 0608  Weight: 290 lb (131.543 kg) 289 lb 14.5 oz (131.5 kg) 290 lb 6.4 oz (131.725 kg)    Labs  CBC  Recent Labs  06/01/15 1527 06/02/15 0635  WBC 3.9* 2.4*  HGB 12.4* 11.8*  HCT 35.8* 34.8*  MCV 90.9 91.3  PLT 52* 45*   Basic Metabolic Panel  Recent Labs  06/01/15 1527  NA 138  K 4.0  CL 106  CO2 27  GLUCOSE 97  BUN 9  CREATININE 0.97  CALCIUM 9.1   Cardiac Enzymes  Recent Labs  06/01/15 1922 06/02/15 0049 06/02/15 0635  TROPONINI <0.03 <0.03 <0.03   Fasting Lipid Panel  Recent Labs  06/02/15 0635  CHOL 157  HDL 27*   LDLCALC 115*  TRIG 75  CHOLHDL 5.8    Disposition  Pt is being discharged home today in good condition.  Follow-up Plans & Appointments      Follow-up Information    Follow up with Ermalinda Barrios, PA-C On 07/13/2015.   Specialty:  Cardiology   Why:  @12 :15pm   Contact information:   [address] [place] [address] [phone]       Discharge Medications    Medication List    STOP taking these medications        doxycycline 100 MG tablet  Commonly known as:  VIBRA-TABS     furosemide 20 MG tablet  Commonly known as:  LASIX      TAKE these medications        aspirin 81 MG tablet  Take 1 tablet (81 mg total) by mouth daily.     atorvastatin 10 MG tablet  Commonly known as:  LIPITOR  Take 1 tablet (10 mg total) by mouth every evening.     buPROPion 150 MG 24 hr tablet  Commonly known as:  WELLBUTRIN XL  Take 1-2 tablets (150-300 mg total) by mouth daily. Take once daily for 7 days then 2 daily.     clopidogrel 75 MG tablet  Commonly known as:  PLAVIX  Take 75 mg by mouth daily.     diclofenac 75 MG EC tablet  Commonly known as:  VOLTAREN  Take 1 tablet (75 mg total) by mouth 2 (two) times daily.     isosorbide mononitrate 30 MG 24 hr tablet  Commonly known as:  IMDUR  Take 1 tablet (30 mg total) by mouth daily.     meloxicam 15 MG tablet  Commonly known as:  MOBIC  Take 1 tablet (15 mg total) by mouth daily.     metoprolol succinate 25 MG 24 hr tablet  Commonly known as:  TOPROL-XL  Take 0.5 tablets (12.5 mg total) by mouth daily.     multivitamin with minerals Tabs tablet  Take 1 tablet by mouth daily.     nitroGLYCERIN 0.4 MG SL tablet  Commonly known as:  NITROSTAT  Place 1 tablet (0.4 mg total) under the tongue every 5 (five) minutes as needed for chest pain (up to 3 doses).     pantoprazole 40 MG tablet  Commonly known as:  PROTONIX  Take 1 tablet (40 mg total) by mouth daily.     traMADol 50 MG tablet  Commonly  known as:  ULTRAM  Take 1 tablet (50 mg total) by mouth every 8 (eight) hours as needed.        Duration of Discharge Encounter   Greater than 30 minutes including physician time.  Hilbert Corrigan PA-C Pager: [phone] 06/02/2015, 1:25 PM   Attending Note:   The patient was seen and examined.  Agree with assessment and plan as noted above.  Changes made to the above note as needed.  See my progress note.  Patient is stable for DC   Ramond Dial., MD, [facility] 06/02/2015, 5:42 PM [address]  [place] Pager [phone]

## 2015-06-02 NOTE — Discharge Instructions (Signed)
No driving for 24 hours. No lifting over 5 lbs for 1 week. No sexual activity for 1 week. Keep procedure site clean & dry. If you notice increased pain, swelling, bleeding or pus, call/return!  You may shower, but no soaking baths/hot tubs/pools for 1 week.   Please note we have discontinued your lasix as your recent dizziness was concerning for dehydration. Please avoid salt in cooking. If you have any sign and symptoms of acute heart failure, include edema in the leg, weight gain or shortness of breath when laying down, please contact cardiology.

## 2015-06-02 NOTE — Progress Notes (Signed)
CARDIAC REHAB PHASE I   PRE:  Rate/Rhythm: 61 SR  BP:  Supine: 136/64  Sitting:   Standing:    SaO2:   MODE:  Ambulation: 550 ft   POST:  Rate/Rhythm: 66 SR  BP:  Supine:   Sitting: 149/74  Standing:    SaO2:  1310-1354 Pt walked 550 ft with steady gait. No CP or dizziness. Discussed smoking cessation and gave fake cigarette. Pt stated he is under a lot of stress at home and that is one reason he has not been able to quit all together. Discussed NTG use, encouraged walking as ex and then to use elliptical that he recently got. Declined referral to CRP 2.   Stated no questions re ed done in December.   Graylon Good, RN BSN  06/02/2015 1:51 PM

## 2015-06-08 ENCOUNTER — Telehealth: Payer: Self-pay

## 2015-06-08 NOTE — Telephone Encounter (Signed)
Request from [place], sent to [place] on 06/09/2015 .

## 2015-06-10 ENCOUNTER — Encounter: Payer: Self-pay | Admitting: Nurse Practitioner

## 2015-06-10 ENCOUNTER — Ambulatory Visit (INDEPENDENT_AMBULATORY_CARE_PROVIDER_SITE_OTHER): Payer: Medicaid Other | Admitting: Nurse Practitioner

## 2015-06-10 VITALS — BP 120/66 | HR 67 | Ht 75.0 in | Wt 298.6 lb

## 2015-06-10 DIAGNOSIS — Z720 Tobacco use: Secondary | ICD-10-CM

## 2015-06-10 DIAGNOSIS — E785 Hyperlipidemia, unspecified: Secondary | ICD-10-CM | POA: Insufficient documentation

## 2015-06-10 DIAGNOSIS — I2510 Atherosclerotic heart disease of native coronary artery without angina pectoris: Secondary | ICD-10-CM | POA: Diagnosis not present

## 2015-06-10 DIAGNOSIS — I480 Paroxysmal atrial fibrillation: Secondary | ICD-10-CM | POA: Insufficient documentation

## 2015-06-10 DIAGNOSIS — E669 Obesity, unspecified: Secondary | ICD-10-CM | POA: Insufficient documentation

## 2015-06-10 MED ORDER — CLOPIDOGREL BISULFATE 75 MG PO TABS: 75.0000 mg | ORAL_TABLET | Freq: Every day | ORAL | Status: DC

## 2015-06-10 NOTE — Progress Notes (Signed)
Patient Name: SHAQUAN MISSEY Date of Encounter: 06/10/2015  Primary Care Provider:  Clearance Coots, MD Primary Cardiologist:  Jerilynn Mages. Burt Knack, MD   Chief Complaint  59 year old male with a prior history of CAD who presents following recent diagnostic catheterization.  Past Medical History   Past Medical History  Diagnosis Date  . GERD (gastroesophageal reflux disease)   . Psoriasis   . Frequent headaches   . Facial cellulitis 2014    Periorbital  . Coronary artery disease     a. Evaluated by Dr. Stanford Breed 2012 - nonobstructive 2007-2008 by cath. b. Abnormal stress test 10/2014 but cath deferred temporarily due to thrombocytopenia. c. 10/2014: readm with AF-RVR/NSTEMI - s/p BMS to mLCx 11/04/14 (mod dz in RCA);  d. relook cath 01/2015 and 06/02/2015 patent stent, nonobs CAD, EF 55-65%.  . Back pain, lumbosacral 2014  . Thrombocytopenia     a. Onset unclear, but noted 2012. ? 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 s/p prednisone, IVIG.  Marland Kitchen Hyperlipidemia   . Obesity   . Elevated LFTs   . Fatty liver US - 2012  . PAF (paroxysmal atrial fibrillation)     a. Episode 10/2014 at time of NSTEMI, spont converted to NSR. Observation for further episodes (not on anticoag at present time due to [facility] = 1, thrombocytopenia).  . Sinus bradycardia   . OSA (obstructive sleep apnea)     severe with AHI 56/hr with successful CPAP titration to 18cm H2O  . Morbid obesity    Past Surgical History  Procedure Laterality Date  . Tonsillectomy  1960's  . Cholecystectomy  2007  . Knee arthroplasty  1970's  . Facial cosmetic surgery  2014  . Cardiac catheterization    . Left heart catheterization with coronary angiogram N/A 11/04/2014    Procedure: LEFT HEART CATHETERIZATION WITH CORONARY ANGIOGRAM;  Surgeon: Jettie Booze, MD;  Location: [facility] CATH LAB;  Service: Cardiovascular;  Laterality: N/A;  . Left heart catheterization with coronary angiogram N/A 01/28/2015    Procedure: LEFT HEART CATHETERIZATION WITH CORONARY ANGIOGRAM;  Surgeon: Sherren Mocha, MD;  Location: [facility] CATH LAB;  Service: Cardiovascular;  Laterality: N/A;  . Cardiac catheterization N/A 06/02/2015    Procedure: Left Heart Cath and Coronary Angiography;  Surgeon: Jettie Booze, MD;  Location: [facility] CV LAB;  Service: Cardiovascular;  Laterality: N/A;    Allergies  No Known Allergies  HPI  59 year old male with the above complex past medical history. He does have a history of coronary artery disease and is status post bare-metal stenting of the mid left circumflex in December 2015. He had a relook catheterization in March 2016 which showed continued patency. He was recently admitted to [facility] with recurrent chest discomfort and ruled out for MI. Repeat catheterization was performed and again revealed minimal nonobstructive in-stent restenosis within the left circumflex and otherwise nonobstructive CAD. LV function was normal. He has been maintained on aspirin, Plavix, beta blocker, statin, and long-acting nitrate therapy. Since his discharge, he reports having done reasonably well without significant limitations for recurrent symptoms. 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 He typically works on [employer] and he is not sure that he be able to climb those ladders anymore. He denies any PND, orthopnea, dizziness, syncope, or early satiety. He does occasionally experience mild lower extremity edema.  Home Medications  Prior to Admission medications   Medication Sig Start Date End Date  Taking? Authorizing Provider  aspirin 81 MG tablet Take 1 tablet (81 mg total) by mouth daily. 10/31/14  Yes Dayna N Dunn, PA-C  atorvastatin (LIPITOR) 10 MG tablet Take 1 tablet (10 mg total) by mouth every evening. 12/16/14  Yes Sherren Mocha, MD  buPROPion (WELLBUTRIN XL) 150 MG 24 hr tablet Take 150 mg by mouth daily.   Yes Historical Provider, MD    clopidogrel (PLAVIX) 75 MG tablet Take 1 tablet (75 mg total) by mouth daily. 06/10/15  Yes Rogelia Mire, NP  diclofenac (VOLTAREN) 75 MG EC tablet Take 75 mg by mouth 2 (two) times daily as needed for moderate pain.   Yes Historical Provider, MD  isosorbide mononitrate (IMDUR) 30 MG 24 hr tablet Take 1 tablet (30 mg total) by mouth daily. 01/23/15  Yes Sherren Mocha, MD  meloxicam (MOBIC) 15 MG tablet Take 1 tablet (15 mg total) by mouth daily. 03/10/15  Yes April Palumbo, MD  metoprolol succinate (TOPROL-XL) 25 MG 24 hr tablet Take 0.5 tablets (12.5 mg total) by mouth daily. 02/26/15  Yes Sherren Mocha, MD  Multiple Vitamin (MULTIVITAMIN WITH MINERALS) TABS tablet Take 1 tablet by mouth daily.   Yes Historical Provider, MD  nitroGLYCERIN (NITROSTAT) 0.4 MG SL tablet Place 1 tablet (0.4 mg total) under the tongue every 5 (five) minutes as needed for chest pain (up to 3 doses). 10/31/14  Yes Dayna N Dunn, PA-C  pantoprazole (PROTONIX) 40 MG tablet Take 1 tablet (40 mg total) by mouth daily. 10/31/14  Yes Dayna N Dunn, PA-C  traMADol (ULTRAM) 50 MG tablet Take 1 tablet (50 mg total) by mouth every 8 (eight) hours as needed. 04/23/15  Yes Rosemarie Ax, MD    Review of Systems  As above, overall he is doing well. He does occasionally is mild lower extremity edema.  All other systems reviewed and are otherwise negative except as noted above.  Physical Exam  VS:  BP 120/66 mmHg  Pulse 67  Ht 6\' 3"  (1.905 m)  Wt 298 lb 9.6 oz (135.444 kg)  BMI 37.32 kg/m2  SpO2 97% , BMI Body mass index is 37.32 kg/(m^2). GEN: Well nourished, well developed, in no acute distress. HEENT: normal. Neck: Supple, no JVD, carotid bruits, or masses. Cardiac: RRR, no murmurs, rubs, or gallops. No clubbing, cyanosis.  Right wrist catheterization site without bleeding, bruit, or hematoma. Trace bilateral malleolar edema. Radials/DP/PT 2+ and equal bilaterally.  Respiratory:  Respirations regular and unlabored,  clear to auscultation bilaterally. GI: Soft, nontender, nondistended, BS + x 4. MS: no deformity or atrophy. Skin: warm and dry, no rash. Neuro:  Strength and sensation are intact. Psych: Normal affect.  Accessory Clinical Findings  none  Assessment & Plan  1.  Coronary artery disease: Status post prior bare-metal stenting of the left circumflex in December 2015 with relook catheterization in March 2016 and again earlier this month. He has stable anatomy with minimal in-stent restenosis within the left circumflex. He has done well since discharge and reports being able to be relatively active without significant symptoms. He remains on aspirin, statin, Plavix, nitrate, and beta blocker therapy.  2. Hyperlipidemia: LDL was 1:15 on July 12. He is currently on Lipitor 10.  3. Tobacco abuse: Complete cessation advised. He continues to smoke a few cigarettes per day and is not necessarily motivated to quit.  4. Morbid obesity: He says he is walking some every day. He also reports being careful with what he eats and preparing his own foods  at home.  5. Paroxysmal atrial fibrillation: No recent palpitations or evidence of PAF during his hospitalization. CHA2DS2VASc equals 1. He remains on dual antiplatelets therapy in the setting of non-STEMI and stenting in December 2015. Further, in setting of chronic thrombocytopenia, he has not previously been felt to be a strong candidate for oral anticoagulation.  6. Disposition: Follow-up with Dr. Burt Knack in 6 months or sooner if necessary.   Murray Hodgkins, NP 06/10/2015, 8:31 AM

## 2015-06-10 NOTE — Patient Instructions (Signed)
Medication Instructions:  Your physician recommends that you continue on your current medications as directed. Please refer to the Current Medication list given to you today.  A refill Rx for Plavix has been sent to your pharmacy  Labwork: None   Testing/Procedures: None   Follow-Up: Your physician wants you to follow-up in: 6 months with Dr.Cooper You will receive a reminder letter in the mail two months in advance. If you don't receive a letter, please call our office to schedule the follow-up appointment.   Any Other Special Instructions Will Be Listed Below (If Applicable).

## 2015-06-30 ENCOUNTER — Telehealth: Payer: Self-pay

## 2015-06-30 NOTE — Telephone Encounter (Signed)
Request from [place], sent to [place] on 07/01/2015 .

## 2015-07-13 ENCOUNTER — Encounter: Payer: Medicaid Other | Admitting: Physician Assistant

## 2015-07-16 ENCOUNTER — Ambulatory Visit (INDEPENDENT_AMBULATORY_CARE_PROVIDER_SITE_OTHER): Payer: Medicaid Other | Admitting: Family Medicine

## 2015-07-16 ENCOUNTER — Encounter: Payer: Self-pay | Admitting: Family Medicine

## 2015-07-16 VITALS — BP 135/62 | HR 75 | Temp 98.3°F | Ht 75.0 in | Wt 296.8 lb

## 2015-07-16 DIAGNOSIS — M25562 Pain in left knee: Secondary | ICD-10-CM | POA: Diagnosis not present

## 2015-07-16 DIAGNOSIS — M5441 Lumbago with sciatica, right side: Secondary | ICD-10-CM | POA: Diagnosis present

## 2015-07-16 DIAGNOSIS — M25561 Pain in right knee: Secondary | ICD-10-CM

## 2015-07-16 DIAGNOSIS — M5489 Other dorsalgia: Secondary | ICD-10-CM | POA: Diagnosis not present

## 2015-07-16 DIAGNOSIS — L918 Other hypertrophic disorders of the skin: Secondary | ICD-10-CM | POA: Diagnosis not present

## 2015-07-16 DIAGNOSIS — M545 Low back pain, unspecified: Secondary | ICD-10-CM

## 2015-07-16 NOTE — Patient Instructions (Signed)
Thank you for coming in,   They will be calling you to schedule the MRI once it is approved by your insurance.   I have referred you to the orthopedic office for consideration of different type of injections.   I am sending in prednisone to help with this acute flare.   Please bring all of your medications with you to each visit.    Please feel free to call with any questions or concerns at any time, at [phone]. --Dr. Raeford Razor

## 2015-07-16 NOTE — Progress Notes (Signed)
Subjective:    Vincent Ochoa - 59 y.o. male MRN [id number]  Date of birth: [birth year]  HPI  PRADEEP BEAUBRUN is here for back, knee and rash.   Back Pain: Location: mid low back,  Duration: 3.5 weeks  Quality: 10/10 Current Functional Status:  ADL's  Preceding Events: fell 27 feet out of a tree stand. Broke his back '91 when a bull hit him in the back  Alleviating Factors: heating pad and asa  Exacerbating Factors: activity  Hx of intervention: had PT a long time ago ('91)  Hx of imaging: most recent in 03/03/15 Red Flags: no impaired bowel or bladder function  Knee pain  He received injection in June '16 and it lasted for about three weeks.   Location: b/l knee  Pain started: years  Pain is: ache  Severity: 10/10  Medications tried: tramadol  Recent trauma: no Similar pain previously: no  Symptoms Redness:no Swelling:no Fever: no Weakness: no Weight loss: no Rash: no  Skin tags: started noticing them recently  Burning in nature.  They are painful  There is no rash or erythema.  He notices them when they catch on his shirt.  There is one on his left chest and around his neck.   Health Maintenance:  Health Maintenance Due  Topic Date Due  . COLONOSCOPY  02/27/2006  . INFLUENZA VACCINE  06/22/2015    -  reports that he has been smoking Cigarettes.  He has a 8.6 pack-year smoking history. He has never used smokeless tobacco. - Review of Systems: Per HPI.  - Past Medical History: Patient Active Problem List   Diagnosis Date Noted  . Coronary artery disease   . PAF (paroxysmal atrial fibrillation)   . Hyperlipidemia   . Morbid obesity   . Chest pain 06/02/2015  . Leg swelling 04/23/2015  . Transaminitis 04/23/2015  . Gross hematuria 04/08/2015  . Cellulitis 03/12/2015  . Knee pain, bilateral 03/05/2015  . Depression 01/08/2015  . OSA (obstructive sleep apnea)   . Tobacco abuse 12/08/2014  . Physical deconditioning 12/08/2014  . Paroxysmal atrial  fibrillation 11/05/2014  . Hyperlipidemia LDL goal <70 11/05/2014  . Obesity 11/05/2014  . NSTEMI (non-ST elevated myocardial infarction)   . Back pain, lumbosacral 01/04/2013  . Thrombocytopenia 11/29/2012  . Decreased libido 08/15/2011  . GERD (gastroesophageal reflux disease) 08/15/2011  . CAD (coronary artery disease) 07/29/2011  . Psoriasis 07/29/2011   - Medications: reviewed and updated Current Outpatient Prescriptions  Medication Sig Dispense Refill  . aspirin 81 MG tablet Take 1 tablet (81 mg total) by mouth daily.    Marland Kitchen atorvastatin (LIPITOR) 10 MG tablet Take 1 tablet (10 mg total) by mouth every evening. 90 tablet 3  . buPROPion (WELLBUTRIN XL) 150 MG 24 hr tablet Take 150 mg by mouth daily.    . clopidogrel (PLAVIX) 75 MG tablet Take 1 tablet (75 mg total) by mouth daily. 30 tablet 7  . diclofenac (VOLTAREN) 75 MG EC tablet Take 75 mg by mouth 2 (two) times daily as needed for moderate pain.    . isosorbide mononitrate (IMDUR) 30 MG 24 hr tablet Take 1 tablet (30 mg total) by mouth daily. 30 tablet 11  . meloxicam (MOBIC) 15 MG tablet Take 1 tablet (15 mg total) by mouth daily. 10 tablet 0  . metoprolol succinate (TOPROL-XL) 25 MG 24 hr tablet Take 0.5 tablets (12.5 mg total) by mouth daily. 30 tablet 11  . Multiple Vitamin (MULTIVITAMIN WITH MINERALS) TABS tablet  Take 1 tablet by mouth daily.    . nitroGLYCERIN (NITROSTAT) 0.4 MG SL tablet Place 1 tablet (0.4 mg total) under the tongue every 5 (five) minutes as needed for chest pain (up to 3 doses). 25 tablet 3  . pantoprazole (PROTONIX) 40 MG tablet Take 1 tablet (40 mg total) by mouth daily. 30 tablet 1  . traMADol (ULTRAM) 50 MG tablet Take 1 tablet (50 mg total) by mouth every 8 (eight) hours as needed. 24 tablet 0   No current facility-administered medications for this visit.     Review of Systems See HPI     Objective:   Physical Exam BP 135/62 mmHg  Pulse 75  Temp(Src) 98.3 F (36.8 C) (Oral)  Ht 6\' 3"   (1.905 m)  Wt 296 lb 12.8 oz (134.628 kg)  BMI 37.10 kg/m2 Gen: NAD, alert, cooperative with exam, well-appearing Skin: pedunculated skin tag on lower left chest. Others noticed around neck and face  Knee Exam:  Laterality: bilateral  Appearance: no ecchymosis or erythema  Edema: mild swelling b/l  Tenderness: TTP along joint line b/l  Range of Motion: limited extension on right more than left  Strength:  Quadricep: 5/5 Hamstring: 5/5 Gait: normal Back:  Appearance: symmetric, no scoliosis  Palpation: tenderness of across lower lumbar around L1 and L2 on left and right. No spinal tenderness,   Neuro: Strength hip flexion 5/5,  knee extension 5/5, knee flexion 5/5, dorsiflexion 4/5 on right and 5/5 on left , plantar flexion 4/5 on right and 5/5 on left  Reflexes: patella 2/2 Bilateral  Achilles 2/2 Bilateral Straight Leg Raise: positive on right  Sensation to light touch intact: yes Neurovascularly intact     Assessment & Plan:

## 2015-07-18 DIAGNOSIS — L918 Other hypertrophic disorders of the skin: Secondary | ICD-10-CM | POA: Insufficient documentation

## 2015-07-18 NOTE — Assessment & Plan Note (Signed)
Back pain suggestive of radiculopathy. No hx of MRI  Back imaging suggestive of DDD.  He has hx of trauma and broken back in 1991 - prednisone for 6 days  - MRI lumbar w/out contrast - once MRI completed then consider referral for Epidural injections. Already referring to ortho for consideration of synvisc supplementation so they can most likely handle referral for that

## 2015-07-18 NOTE — Assessment & Plan Note (Signed)
Pain 2/2 to OA.  Imaging showing degenerative changes  Had some relief with injection but has failed conservative treatment to this point  - refer to ortho for consideration of synvisc supplementation  - prednisone given for back should help with pain  - having to avoid NSAIDS and tylenol as he has CAD and Fatty liver disease  - will continue tramadol for pain

## 2015-07-18 NOTE — Assessment & Plan Note (Signed)
He has several of these on her chest, neck and face.  Some of them are causing pain  He would like some removed from his face  - since he wants them removed from his face will refer to Derm instead of derm clinic.

## 2015-07-24 ENCOUNTER — Ambulatory Visit (HOSPITAL_COMMUNITY): Admission: RE | Admit: 2015-07-24 | Payer: Medicaid Other | Source: Ambulatory Visit

## 2015-07-28 ENCOUNTER — Ambulatory Visit (HOSPITAL_COMMUNITY)
Admission: RE | Admit: 2015-07-28 | Discharge: 2015-07-28 | Disposition: A | Discharge status: Home / Self Care | Payer: Medicaid Other | Source: Ambulatory Visit | Attending: Family Medicine | Admitting: Family Medicine

## 2015-07-28 DIAGNOSIS — M8938 Hypertrophy of bone, other site: Secondary | ICD-10-CM | POA: Insufficient documentation

## 2015-07-28 DIAGNOSIS — N281 Cyst of kidney, acquired: Secondary | ICD-10-CM | POA: Insufficient documentation

## 2015-07-28 DIAGNOSIS — M545 Low back pain: Secondary | ICD-10-CM | POA: Diagnosis present

## 2015-07-28 DIAGNOSIS — M5145 Schmorl's nodes, thoracolumbar region: Secondary | ICD-10-CM | POA: Diagnosis not present

## 2015-07-28 DIAGNOSIS — M5441 Lumbago with sciatica, right side: Secondary | ICD-10-CM

## 2015-07-30 ENCOUNTER — Telehealth: Payer: Self-pay | Admitting: Family Medicine

## 2015-07-30 NOTE — Telephone Encounter (Signed)
Spoke with patient about his MRI results. He has an appt with Raliegh Ip to consider epidural injections.   Rosemarie Ax, MD PGY-3, [facility] 07/30/2015, 10:26 AM

## 2015-08-26 ENCOUNTER — Telehealth: Payer: Self-pay

## 2015-08-26 ENCOUNTER — Ambulatory Visit: Payer: Medicaid Other

## 2015-08-26 NOTE — Telephone Encounter (Signed)
Verified appointment with Mr Koven for Dr Alvy Bimler on 09-14-15 at 1430 for lab and 1500 for visit.

## 2015-09-14 ENCOUNTER — Ambulatory Visit (HOSPITAL_BASED_OUTPATIENT_CLINIC_OR_DEPARTMENT_OTHER): Payer: Medicaid Other | Admitting: Hematology and Oncology

## 2015-09-14 ENCOUNTER — Telehealth: Payer: Self-pay | Admitting: Hematology and Oncology

## 2015-09-14 ENCOUNTER — Ambulatory Visit (INDEPENDENT_AMBULATORY_CARE_PROVIDER_SITE_OTHER): Payer: Medicaid Other | Admitting: Family Medicine

## 2015-09-14 ENCOUNTER — Encounter: Payer: Self-pay | Admitting: Hematology and Oncology

## 2015-09-14 ENCOUNTER — Encounter: Payer: Self-pay | Admitting: Family Medicine

## 2015-09-14 ENCOUNTER — Other Ambulatory Visit (HOSPITAL_BASED_OUTPATIENT_CLINIC_OR_DEPARTMENT_OTHER): Payer: Medicaid Other

## 2015-09-14 VITALS — BP 148/67 | HR 87 | Temp 98.1°F | Resp 17 | Ht 75.0 in | Wt 299.7 lb

## 2015-09-14 VITALS — BP 134/74 | HR 83 | Temp 98.0°F | Ht 75.0 in | Wt 296.0 lb

## 2015-09-14 DIAGNOSIS — M25561 Pain in right knee: Secondary | ICD-10-CM

## 2015-09-14 DIAGNOSIS — D696 Thrombocytopenia, unspecified: Secondary | ICD-10-CM

## 2015-09-14 DIAGNOSIS — M25562 Pain in left knee: Secondary | ICD-10-CM

## 2015-09-14 DIAGNOSIS — D72819 Decreased white blood cell count, unspecified: Secondary | ICD-10-CM | POA: Diagnosis not present

## 2015-09-14 DIAGNOSIS — L409 Psoriasis, unspecified: Secondary | ICD-10-CM | POA: Diagnosis not present

## 2015-09-14 DIAGNOSIS — I214 Non-ST elevation (NSTEMI) myocardial infarction: Secondary | ICD-10-CM

## 2015-09-14 DIAGNOSIS — Z23 Encounter for immunization: Secondary | ICD-10-CM | POA: Diagnosis not present

## 2015-09-14 LAB — CBC WITH DIFFERENTIAL/PLATELET
BASO%: 0.4 % (ref 0.0–2.0)
Basophils Absolute: 0 10*3/uL (ref 0.0–0.1)
EOS%: 1.6 % (ref 0.0–7.0)
Eosinophils Absolute: 0.1 10*3/uL (ref 0.0–0.5)
HCT: 39.7 % (ref 38.4–49.9)
HGB: 13.4 g/dL (ref 13.0–17.1)
LYMPH%: 17.2 % (ref 14.0–49.0)
MCH: 31.9 pg (ref 27.2–33.4)
MCHC: 33.8 g/dL (ref 32.0–36.0)
MCV: 94.6 fL (ref 79.3–98.0)
MONO#: 0.2 10*3/uL (ref 0.1–0.9)
MONO%: 4.7 % (ref 0.0–14.0)
NEUT#: 2.5 10*3/uL (ref 1.5–6.5)
NEUT%: 76.1 % — AB (ref 39.0–75.0)
PLATELETS: 55 10*3/uL — AB (ref 140–400)
RBC: 4.2 10*6/uL (ref 4.20–5.82)
RDW: 14.7 % — ABNORMAL HIGH (ref 11.0–14.6)
WBC: 3.3 10*3/uL — ABNORMAL LOW (ref 4.0–10.3)
lymph#: 0.6 10*3/uL — ABNORMAL LOW (ref 0.9–3.3)

## 2015-09-14 MED ORDER — METHYLPREDNISOLONE ACETATE 40 MG/ML IJ SUSP
40.0000 mg | Freq: Once | INTRAMUSCULAR | Status: AC
  Administered 2015-09-14: 40 mg via INTRA_ARTICULAR

## 2015-09-14 MED ORDER — INFLUENZA VAC SPLIT QUAD 0.5 ML IM SUSY
0.5000 mL | PREFILLED_SYRINGE | Freq: Once | INTRAMUSCULAR | Status: AC
  Administered 2015-09-14: 0.5 mL via INTRAMUSCULAR
  Filled 2015-09-14: qty 0.5

## 2015-09-14 MED ORDER — CYCLOBENZAPRINE HCL 10 MG PO TABS: 10.0000 mg | ORAL_TABLET | Freq: Three times a day (TID) | ORAL | Status: DC | PRN

## 2015-09-14 NOTE — Patient Instructions (Signed)
Thank you for coming in,   Please let me know how the Flexeril does for you.  Call me if it doesn't help we can try something else.  Please bring all of your medications with you to each visit.   Sign up for My Chart to have easy access to your labs results, and communication with your Primary care physician   Please feel free to call with any questions or concerns at any time, at [phone]. --Dr. Raeford Razor

## 2015-09-14 NOTE — Telephone Encounter (Signed)
per pof to sch pt appt-gave pt copy of avs °

## 2015-09-14 NOTE — Progress Notes (Signed)
Subjective:    Vincent Ochoa - 59 y.o. male MRN [id number]  Date of birth: 1956/02/18  HPI  Vincent Ochoa is here for b/l knee pain.  B/L knee pain:  Has been to the ortho office and received an injection into his hip.  Last injection into knees was June  Pain still occuring in both knees.  Had last injections in June and had some improvement.    Health Maintenance:  Health Maintenance Due  Topic Date Due  . COLONOSCOPY  02/27/2006  . INFLUENZA VACCINE  06/22/2015    -  reports that he has been smoking Cigarettes.  He has a 8.6 pack-year smoking history. He has never used smokeless tobacco. - Review of Systems: Per HPI. - Past Medical History: Patient Active Problem List   Diagnosis Date Noted  . Acrochordon 07/18/2015  . Coronary artery disease   . PAF (paroxysmal atrial fibrillation) ([place])   . Morbid obesity ([place])   . Leg swelling 04/23/2015  . Transaminitis 04/23/2015  . Knee pain, bilateral 03/05/2015  . Depression 01/08/2015  . OSA (obstructive sleep apnea)   . Tobacco abuse 12/08/2014  . Physical deconditioning 12/08/2014  . Paroxysmal atrial fibrillation ([place]) 11/05/2014  . Hyperlipidemia LDL goal <70 11/05/2014  . Obesity 11/05/2014  . NSTEMI (non-ST elevated myocardial infarction) ([place])   . Back pain, lumbosacral 01/04/2013  . Thrombocytopenia ([place]) 11/29/2012  . Decreased libido 08/15/2011  . GERD (gastroesophageal reflux disease) 08/15/2011  . CAD (coronary artery disease) 07/29/2011  . Psoriasis 07/29/2011   - Medications: reviewed and updated Current Outpatient Prescriptions  Medication Sig Dispense Refill  . aspirin 81 MG tablet Take 1 tablet (81 mg total) by mouth daily.    Marland Kitchen atorvastatin (LIPITOR) 10 MG tablet Take 1 tablet (10 mg total) by mouth every evening. 90 tablet 3  . buPROPion (WELLBUTRIN XL) 150 MG 24 hr tablet Take 150 mg by mouth daily.    . clopidogrel (PLAVIX) 75 MG tablet Take 1 tablet (75 mg total) by mouth daily. 30 tablet 7    . cyclobenzaprine (FLEXERIL) 10 MG tablet Take 1 tablet (10 mg total) by mouth 3 (three) times daily as needed for muscle spasms. 90 tablet 1  . isosorbide mononitrate (IMDUR) 30 MG 24 hr tablet Take 1 tablet (30 mg total) by mouth daily. 30 tablet 11  . metoprolol succinate (TOPROL-XL) 25 MG 24 hr tablet Take 0.5 tablets (12.5 mg total) by mouth daily. 30 tablet 11  . Multiple Vitamin (MULTIVITAMIN WITH MINERALS) TABS tablet Take 1 tablet by mouth daily.    . nitroGLYCERIN (NITROSTAT) 0.4 MG SL tablet Place 1 tablet (0.4 mg total) under the tongue every 5 (five) minutes as needed for chest pain (up to 3 doses). 25 tablet 3  . pantoprazole (PROTONIX) 40 MG tablet Take 1 tablet (40 mg total) by mouth daily. 30 tablet 1   No current facility-administered medications for this visit.     Review of Systems See HPI     Objective:   Physical Exam BP 134/74 mmHg  Pulse 83  Temp(Src) 98 F (36.7 C) (Oral)  Ht 6\' 3"  (1.905 m)  Wt 296 lb (134.265 kg)  BMI 37.00 kg/m2 Gen: NAD, alert, cooperative with exam, well-appearing MSK: no obvious deformity. TTP along medial and lateral joint line of both knees, 5/5 strength b/l LE, neurovascularly intact  Normal gait  INJECTION: Left knee  Patient was given informed consent, signed copy in the chart. Appropriate time out was taken.  Area prepped and draped in usual sterile fashion. Two cc of methylprednisolone 40 mg/ml plus 4 cc of 1% lidocaine without epinephrine was injected into the left knee joint using a(n) perpendicular approach. The patient tolerated the procedure well. There were no complications. Post procedure instructions were given.   INJECTION: right knee  Patient was given informed consent, signed copy in the chart. Appropriate time out was taken. Area prepped and draped in usual sterile fashion. 2 cc of methylprednisolone 40 mg/ml plus 4 cc of 1% lidocaine without epinephrine was injected into the right knee joint using a(n) perpendicular  approach. The patient tolerated the procedure well. There were no complications. Post procedure instructions were given.       Assessment & Plan:   Knee pain, bilateral Pain most likely 2/2 OA.  No recent injury or trauma  Pain affecting his ability to work  - b/l knee injections today  - added flexeril to his regimen. Counseled on drowsiness. - If there is no improvement with his current regimen then may need to add Norco in order to help with his ADLs

## 2015-09-14 NOTE — Assessment & Plan Note (Signed)
Pain most likely 2/2 OA.  No recent injury or trauma  Pain affecting his ability to work  - b/l knee injections today  - added flexeril to his regimen. Counseled on drowsiness. - If there is no improvement with his current regimen then may need to add Norco in order to help with his ADLs

## 2015-09-15 DIAGNOSIS — D72819 Decreased white blood cell count, unspecified: Secondary | ICD-10-CM | POA: Insufficient documentation

## 2015-09-15 NOTE — Assessment & Plan Note (Signed)
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 He is not symptomatic. Recommend observation only.

## 2015-09-15 NOTE — Progress Notes (Signed)
[place] [facility] OFFICE PROGRESS NOTE  Clearance Coots, MD SUMMARY OF HEMATOLOGIC HISTORY:  Vincent Ochoa had history of cardiac disease was admitted via Emergency Department for recurrent chest pain.He was initially seen in consultation on 12/8 for thrombocytopenia, felt to be likely autoimmune (related to psoriasis and liver disease), responding well to 2 doses of IVIG in addition to prednisone and transfusions. Patient had been discharged on 12/11 home with baby aspirin. He was subsequently readmitted to the hospital again on 11/03/2014 with recurrent chest pain and subsequently underwent cardiac catheterization and placement of bare metal stent on 11/04/2014. On 12/31/2014, he discontinued prednisone INTERVAL HISTORY: RENDER Vincent Ochoa 59 y.o. male returns for further follow-up. The patient is sad. He has lost a court case. He is about to divorce his wife. He continues to have persistent skin lesions from psoriasis but it does not bother him. He had one episode of spontaneous epistaxis, resolved without intervention. He denies any other bleeding complications from his antiplatelet agents. Denies recent infection. Denies recent chest pain or shortness of breath.   I have reviewed the past medical history, past surgical history, social history and family history with the patient and they are unchanged from previous note.  ALLERGIES:  has No Known Allergies.  MEDICATIONS:  Current Outpatient Prescriptions  Medication Sig Dispense Refill  . aspirin 81 MG tablet Take 1 tablet (81 mg total) by mouth daily.    Marland Kitchen atorvastatin (LIPITOR) 10 MG tablet Take 1 tablet (10 mg total) by mouth every evening. 90 tablet 3  . buPROPion (WELLBUTRIN XL) 150 MG 24 hr tablet Take 150 mg by mouth daily.    . clopidogrel (PLAVIX) 75 MG tablet Take 1 tablet (75 mg total) by mouth daily. 30 tablet 7  . cyclobenzaprine (FLEXERIL) 10 MG tablet Take 1 tablet (10 mg total) by mouth 3 (three) times daily as  needed for muscle spasms. 90 tablet 1  . isosorbide mononitrate (IMDUR) 30 MG 24 hr tablet Take 1 tablet (30 mg total) by mouth daily. 30 tablet 11  . metoprolol succinate (TOPROL-XL) 25 MG 24 hr tablet Take 0.5 tablets (12.5 mg total) by mouth daily. 30 tablet 11  . Multiple Vitamin (MULTIVITAMIN WITH MINERALS) TABS tablet Take 1 tablet by mouth daily.    . nitroGLYCERIN (NITROSTAT) 0.4 MG SL tablet Place 1 tablet (0.4 mg total) under the tongue every 5 (five) minutes as needed for chest pain (up to 3 doses). 25 tablet 3  . pantoprazole (PROTONIX) 40 MG tablet Take 1 tablet (40 mg total) by mouth daily. 30 tablet 1   No current facility-administered medications for this visit.     REVIEW OF SYSTEMS:   Constitutional: Denies fevers, chills or night sweats Eyes: Denies blurriness of vision Ears, nose, mouth, throat, and face: Denies mucositis or sore throat Respiratory: Denies cough, dyspnea or wheezes Cardiovascular: Denies palpitation, chest discomfort or lower extremity swelling Gastrointestinal:  Denies nausea, heartburn or change in bowel habits Skin: Denies abnormal skin rashes Lymphatics: Denies new lymphadenopathy or easy bruising Neurological:Denies numbness, tingling or new weaknesses Behavioral/Psych: Mood is stable, no new changes  All other systems were reviewed with the patient and are negative.  PHYSICAL EXAMINATION: ECOG PERFORMANCE STATUS: 1 - Symptomatic but completely ambulatory  Filed Vitals:   09/14/15 1403  BP: 148/67  Pulse: 87  Temp: 98.1 F (36.7 C)  Resp: 17   Filed Weights   09/14/15 1403  Weight: 299 lb 11.2 oz (135.943 kg)    GENERAL:alert,  no distress and comfortable. He is morbidly obese SKIN: Noted persistence right extremity lesions. No petechiae or bruising EYES: normal, Conjunctiva are pink and non-injected, sclera clear OROPHARYNX:no exudate, no erythema and lips, buccal mucosa, and tongue normal  Musculoskeletal:no cyanosis of digits and  no clubbing  NEURO: alert & oriented x 3 with fluent speech, no focal motor/sensory deficits  LABORATORY DATA:  I have reviewed the data as listed Results for orders placed or performed in visit on 09/14/15 (from the past 48 hour(s))  CBC with Differential     Status: Abnormal   Collection Time: 09/14/15  1:53 PM  Result Value Ref Range   WBC 3.3 (L) 4.0 - 10.3 10e3/uL   NEUT# 2.5 1.5 - 6.5 10e3/uL   HGB 13.4 13.0 - 17.1 g/dL   HCT 39.7 38.4 - 49.9 %   Platelets 55 (L) 140 - 400 10e3/uL   MCV 94.6 79.3 - 98.0 fL   MCH 31.9 27.2 - 33.4 pg   MCHC 33.8 32.0 - 36.0 g/dL   RBC 4.20 4.20 - 5.82 10e6/uL   RDW 14.7 (H) 11.0 - 14.6 %   lymph# 0.6 (L) 0.9 - 3.3 10e3/uL   MONO# 0.2 0.1 - 0.9 10e3/uL   Eosinophils Absolute 0.1 0.0 - 0.5 10e3/uL   Basophils Absolute 0.0 0.0 - 0.1 10e3/uL   NEUT% 76.1 (H) 39.0 - 75.0 %   LYMPH% 17.2 14.0 - 49.0 %   MONO% 4.7 0.0 - 14.0 %   EOS% 1.6 0.0 - 7.0 %   BASO% 0.4 0.0 - 2.0 %    Lab Results  Component Value Date   WBC 3.3* 09/14/2015   HGB 13.4 09/14/2015   HCT 39.7 09/14/2015   MCV 94.6 09/14/2015   PLT 55* 09/14/2015    ASSESSMENT & PLAN:  Thrombocytopenia 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 I would not recommend any further treatment unless absolutely necessary.The goal would be just to keep his platelet count above 50,000. He has completed prednisone taper in February.  He will continue dual antiplatelet agents as long as his platelet count remained above 30,000. I explained to the patient the rationale of continuing antiplatelet agents with chronic thrombocytopenia. Due to stability of his platelet count, I will see him back in 6 months.  NSTEMI (non-ST elevated myocardial infarction) He has significant improvement of symptoms since his last saw his cardiologist. Recent heart catheterization did not show any significant disease warranting further intervention. He is put on pain  extended-release nitrates and he denies further chest pain or shortness of breath.    Psoriasis The psoriasis is stable but does not bother him.     Leukopenia This is likely autoimmune mediated related to psoriasis. He is not symptomatic. Recommend observation only.   All questions were answered. The patient knows to call the clinic with any problems, questions or concerns. No barriers to learning was detected.  I spent 15 minutes counseling the patient face to face. The total time spent in the appointment was 20 minutes and more than 50% was on counseling.     Lisle Skillman, MD 10/25/20167:53 AM

## 2015-09-15 NOTE — Assessment & Plan Note (Signed)
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 I would not recommend any further treatment unless absolutely necessary.The goal would be just to keep his platelet count above 50,000. He has completed prednisone taper in February.  He will continue dual antiplatelet agents as long as his platelet count remained above 30,000. I explained to the patient the rationale of continuing antiplatelet agents with chronic thrombocytopenia. Due to stability of his platelet count, I will see him back in 6 months.   

## 2015-09-15 NOTE — Assessment & Plan Note (Signed)
The psoriasis is stable but does not bother him.  

## 2015-09-15 NOTE — Assessment & Plan Note (Signed)
He has significant improvement of symptoms since his last saw his cardiologist. Recent heart catheterization did not show any significant disease warranting further intervention. He is put on pain extended-release nitrates and he denies further chest pain or shortness of breath. 

## 2015-09-30 ENCOUNTER — Encounter: Payer: Self-pay | Admitting: Cardiology

## 2015-09-30 ENCOUNTER — Ambulatory Visit (INDEPENDENT_AMBULATORY_CARE_PROVIDER_SITE_OTHER): Payer: Medicaid Other | Admitting: Cardiology

## 2015-09-30 VITALS — BP 134/76 | HR 78 | Ht 75.0 in | Wt 301.0 lb

## 2015-09-30 DIAGNOSIS — E669 Obesity, unspecified: Secondary | ICD-10-CM

## 2015-09-30 DIAGNOSIS — G4733 Obstructive sleep apnea (adult) (pediatric): Secondary | ICD-10-CM

## 2015-09-30 NOTE — Patient Instructions (Signed)
Medication Instructions:  Your physician has recommended you make the following change in your medication:  1) START OTC nasal saline spray 2 sprays each nostril twice daily  Labwork: None  Testing/Procedures: None  Follow-Up: Your physician wants you to follow-up in: 6 months with Dr. Radford Pax. You will receive a reminder letter in the mail two months in advance. If you don't receive a letter, please call our office to schedule the follow-up appointment.   Any Other Special Instructions Will Be Listed Below (If Applicable).     If you need a refill on your cardiac medications before your next appointment, please call your pharmacy.

## 2015-09-30 NOTE — Progress Notes (Signed)
Cardiology Office Note   Date:  09/30/2015   ID:  [name], DOB [birth year], MRN [id number]  PCP:  Clearance Coots, MD    Chief Complaint  Patient presents with  . Sleep Apnea  . Obesity      History of Present Illness: Vincent Ochoa is a 59 y.o. male who presents for evaluation of OSA. He recently had a sleep study ordered by Dr. Burt Knack which showed severe OSA with an AHI of 56 events per hour and underwent successful CPAP titration to 18cm H2O. He says that he was waking up in the am very tired and felt like he had not slept the night before. He also was snoring at night. He now presents for followup. He tolerates his CPAP well. He tolerates the full face mask and feels the pressure is adequate. He has not been using it over the past month because he says that he needs a new mask and is not going to get one from [facility]  He is frustrated with [facility] and says that they are charging too much for supplies.  He does still fall asleep during the day if he sits in his recliner and then cannot sleep at night.     Past Medical History  Diagnosis Date  . GERD (gastroesophageal reflux disease)   . Psoriasis   . Frequent headaches   . Facial cellulitis 2014    Periorbital  . Coronary artery disease     a. Evaluated by Dr. Stanford Breed 2012 - nonobstructive 2007-2008 by cath. b. Abnormal stress test 10/2014 but cath deferred temporarily due to thrombocytopenia. c. 10/2014: readm with AF-RVR/NSTEMI - s/p BMS to mLCx 11/04/14 (mod dz in RCA);  d. relook cath 01/2015 and 06/02/2015 patent stent, nonobs CAD, EF 55-65%.  . Back pain, lumbosacral 2014  . Thrombocytopenia ([place])     a. Onset unclear, but noted 2012. ? 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 s/p prednisone, IVIG.  Marland Kitchen Hyperlipidemia   . Obesity   . Elevated LFTs   . Fatty liver US - 2012  . PAF (paroxysmal atrial fibrillation) ([place])     a. Episode 10/2014 at time of NSTEMI, spont  converted to NSR. Observation for further episodes (not on anticoag at present time due to [facility] = 1, thrombocytopenia).  . Sinus bradycardia   . OSA (obstructive sleep apnea)     severe with AHI 56/hr with successful CPAP titration to 18cm H2O  . Morbid obesity [facility])     Past Surgical History  Procedure Laterality Date  . Tonsillectomy  1960's  . Cholecystectomy  2007  . Knee arthroplasty  1970's  . Facial cosmetic surgery  2014  . Cardiac catheterization    . Left heart catheterization with coronary angiogram N/A 11/04/2014    Procedure: LEFT HEART CATHETERIZATION WITH CORONARY ANGIOGRAM;  Surgeon: Jettie Booze, MD;  Location: [facility] CATH LAB;  Service: Cardiovascular;  Laterality: N/A;  . Left heart catheterization with coronary angiogram N/A 01/28/2015    Procedure: LEFT HEART CATHETERIZATION WITH CORONARY ANGIOGRAM;  Surgeon: Sherren Mocha, MD;  Location: [facility] CATH LAB;  Service: Cardiovascular;  Laterality: N/A;  . Cardiac catheterization N/A 06/02/2015    Procedure: Left Heart Cath and Coronary Angiography;  Surgeon: Jettie Booze, MD;  Location: [place] CV LAB;  Service: Cardiovascular;  Laterality: N/A;     Current Outpatient Prescriptions  Medication Sig Dispense Refill  . aspirin 81 MG tablet Take 1 tablet (81 mg total) by mouth daily.    Marland Kitchen atorvastatin (LIPITOR) 10 MG tablet Take 1 tablet (10 mg total) by mouth every evening. 90 tablet 3  . buPROPion (WELLBUTRIN XL) 150 MG 24 hr tablet Take 150 mg by mouth daily.    . clopidogrel (PLAVIX) 75 MG tablet Take 1 tablet (75 mg total) by mouth daily. 30 tablet 7  . cyclobenzaprine (FLEXERIL) 10 MG tablet Take 1 tablet (10 mg total) by mouth 3 (three) times daily as needed for muscle spasms. 90 tablet 1  . isosorbide mononitrate (IMDUR) 30 MG 24 hr tablet Take 1 tablet (30 mg total) by mouth daily. 30 tablet 11  . metoprolol succinate (TOPROL-XL) 25 MG 24 hr tablet Take 0.5 tablets (12.5 mg total) by mouth daily. 30  tablet 11  . nitroGLYCERIN (NITROSTAT) 0.4 MG SL tablet Place 1 tablet (0.4 mg total) under the tongue every 5 (five) minutes as needed for chest pain (up to 3 doses). 25 tablet 3  . pantoprazole (PROTONIX) 40 MG tablet Take 1 tablet (40 mg total) by mouth daily. 30 tablet 1   No current facility-administered medications for this visit.    Allergies:   Review of patient's allergies indicates no known allergies.    Social History:  The patient  reports that he has been smoking Cigarettes.  He has a 8.6 pack-year smoking history. He has never used smokeless tobacco. He reports that he drinks alcohol. He reports that he does not use illicit drugs.   Family History:  The patient's family history includes Arthritis in his maternal grandmother; Cancer - Ovarian in his sister; Cirrhosis in his mother; Diabetes in his sister; Heart attack in his maternal aunt and mother; Heart disease in his sister and another family member; Hypertension in his father and mother; Stroke in his father.    ROS:  Please see the history of present illness.   Otherwise, review of systems are positive for none.   All other systems are reviewed and negative.    PHYSICAL EXAM: VS:  BP 134/76 mmHg  Pulse 78  Ht 6\' 3"  (1.905 m)  Wt 301 lb (136.533 kg)  BMI 37.62 kg/m2  SpO2 98% , BMI Body mass index is 37.62 kg/(m^2). GEN: Well nourished, well developed, in no acute distress HEENT: normal Neck: no JVD, carotid bruits, or masses Cardiac: RRR; no murmurs, rubs, or gallops,no edema  Respiratory:  clear to auscultation bilaterally, normal work of breathing GI: soft, nontender, nondistended, + BS MS: no deformity or atrophy Skin: warm and dry, no rash Neuro:  Strength and sensation are intact Psych: euthymic mood, full affect   EKG:  EKG is not ordered today.    Recent Labs: 10/28/2014: Magnesium 1.7 11/03/2014: ALT 64* 01/09/2015: Pro B Natriuretic peptide (BNP) 43.0 03/12/2015: TSH 1.43 06/01/2015: B Natriuretic  Peptide 148.6*; BUN 9; Creatinine, Ser 0.97; Potassium 4.0; Sodium 138 09/14/2015: HGB 13.4; Platelets 55*    Lipid Panel    Component Value Date/Time   CHOL 157 06/02/2015 0635   TRIG 75 06/02/2015 0635   HDL 27* 06/02/2015 0635   CHOLHDL 5.8 06/02/2015 0635   VLDL 15 06/02/2015 0635   LDLCALC 115* 06/02/2015 0635      Wt Readings from Last 3 Encounters:  09/30/15 301 lb (136.533 kg)  09/14/15 299 lb 11.2 oz (135.943 kg)  09/14/15 296 lb (134.265 kg)      ASSESSMENT AND PLAN:  1. Severe OSA on CPAP and tolerating well. His download today showed an AHI of 7.5 per hour on 18cm H2O but on ly 0% compliance in using more than 4 hours nightly and only 3% usage in the past month. We discussed the need to be compliant in using at least 4-5 hours nightly for >70% of the time or insurance will not pay for the device and he will not get the full benefit. He does not like [facility] so I am referring him to [place] [facility] [place]  I will order him a new mask.  I recommended that if he gets sleepy during the day that he get up and find something to do and avoid napping during the day which will prevent him from sleeping at night.  I have recommended that he use nasal saline spray BID given his increased nasal congestion and drainage.  If this doesn't help we will try a trial of nasal steroid spray.  He will let me know how he is doing with the saline in a few weeks.  His AHI was mildly elevated but he does not turn off his machine when he gets up at night to use the restroom.  I encouraged him to turn the machine off or pause it and restart when he gets back in bed. 2. Obesity - he is walking his dog for exercise.   Current medicines are reviewed at length with the patient today.  The patient does not have concerns regarding medicines.  The following changes have been made:  no change  Labs/ tests ordered today: See above Assessment and Plan No orders of the defined  types were placed in this encounter.     Disposition:   FU with me in 6 months  Signed, Sueanne Margarita, MD  09/30/2015 3:33 PM    [facility] [address], [place], [place]  [address] Phone: [phone]; Fax: [phone]

## 2015-10-02 ENCOUNTER — Emergency Department (HOSPITAL_COMMUNITY)
Admission: EM | Admit: 2015-10-02 | Discharge: 2015-10-03 | Disposition: A | Discharge status: Home / Self Care | Payer: Medicaid Other | Attending: Emergency Medicine | Admitting: Emergency Medicine

## 2015-10-02 ENCOUNTER — Telehealth: Payer: Self-pay | Admitting: Family Medicine

## 2015-10-02 ENCOUNTER — Encounter (HOSPITAL_COMMUNITY): Payer: Self-pay

## 2015-10-02 DIAGNOSIS — I480 Paroxysmal atrial fibrillation: Secondary | ICD-10-CM | POA: Diagnosis not present

## 2015-10-02 DIAGNOSIS — G4733 Obstructive sleep apnea (adult) (pediatric): Secondary | ICD-10-CM | POA: Insufficient documentation

## 2015-10-02 DIAGNOSIS — Z79899 Other long term (current) drug therapy: Secondary | ICD-10-CM | POA: Diagnosis not present

## 2015-10-02 DIAGNOSIS — K219 Gastro-esophageal reflux disease without esophagitis: Secondary | ICD-10-CM | POA: Diagnosis not present

## 2015-10-02 DIAGNOSIS — L03116 Cellulitis of left lower limb: Secondary | ICD-10-CM | POA: Insufficient documentation

## 2015-10-02 DIAGNOSIS — M7989 Other specified soft tissue disorders: Secondary | ICD-10-CM

## 2015-10-02 DIAGNOSIS — Z7982 Long term (current) use of aspirin: Secondary | ICD-10-CM | POA: Diagnosis not present

## 2015-10-02 DIAGNOSIS — E785 Hyperlipidemia, unspecified: Secondary | ICD-10-CM | POA: Diagnosis not present

## 2015-10-02 DIAGNOSIS — I2510 Atherosclerotic heart disease of native coronary artery without angina pectoris: Secondary | ICD-10-CM | POA: Diagnosis not present

## 2015-10-02 DIAGNOSIS — Z9889 Other specified postprocedural states: Secondary | ICD-10-CM | POA: Diagnosis not present

## 2015-10-02 DIAGNOSIS — Z720 Tobacco use: Secondary | ICD-10-CM | POA: Insufficient documentation

## 2015-10-02 DIAGNOSIS — M79605 Pain in left leg: Secondary | ICD-10-CM | POA: Diagnosis present

## 2015-10-02 DIAGNOSIS — Z872 Personal history of diseases of the skin and subcutaneous tissue: Secondary | ICD-10-CM | POA: Diagnosis not present

## 2015-10-02 DIAGNOSIS — Z862 Personal history of diseases of the blood and blood-forming organs and certain disorders involving the immune mechanism: Secondary | ICD-10-CM | POA: Diagnosis not present

## 2015-10-02 LAB — I-STAT CG4 LACTIC ACID, ED: LACTIC ACID, VENOUS: 2 mmol/L (ref 0.5–2.0)

## 2015-10-02 MED ORDER — CLINDAMYCIN PHOSPHATE 600 MG/50ML IV SOLN
600.0000 mg | Freq: Once | INTRAVENOUS | Status: AC
  Administered 2015-10-02: 600 mg via INTRAVENOUS
  Filled 2015-10-02: qty 50

## 2015-10-02 MED ORDER — ONDANSETRON HCL 4 MG/2ML IJ SOLN
4.0000 mg | Freq: Once | INTRAMUSCULAR | Status: AC
  Administered 2015-10-02: 4 mg via INTRAVENOUS
  Filled 2015-10-02: qty 2

## 2015-10-02 MED ORDER — HYDROCODONE-ACETAMINOPHEN 5-325 MG PO TABS
1.0000 | ORAL_TABLET | Freq: Once | ORAL | Status: AC
  Administered 2015-10-02: 1 via ORAL
  Filled 2015-10-02: qty 1

## 2015-10-02 NOTE — Telephone Encounter (Signed)
Pt states that someone called him. Pt asking once more about this message. Sadie Reynolds, ASA

## 2015-10-02 NOTE — ED Provider Notes (Signed)
CSN: [id number]     Arrival date & time 10/02/15  2049 History   First MD Initiated Contact with Patient 10/02/15 2207     Chief Complaint  Patient presents with  . Cellulitis     (Consider location/radiation/quality/duration/timing/severity/associated sxs/prior Treatment) HPI 59 year old male who presents with leg pain and redness. History of paroxysmal atrial fibrillation, CAD, morbid obesity, and OSA. Has chronic lower extremity swelling with history of cellulitis of his lower extremities in the past. States that over the past few days he has been feeling unwell. Has had subjective fevers and chills. Has had an episode of vomiting yesterday. States that this morning he woke up with significant pain in his left lower extremity and increased redness around his left lower leg. Says that this is consistent with prior episodes of cellulitis. I tried to make a an appointment with his primary care doctor, who could not evaluate him in time. Subsequently presents to our ED for evaluation. Denies any numbness or tingling/weakness, abdominal pain, chest pain, difficulty breathing, cough, URI symptoms, or diarrhea. Also states that he has had some urinary frequency with dysuria. Past Medical History  Diagnosis Date  . GERD (gastroesophageal reflux disease)   . Psoriasis   . Frequent headaches   . Facial cellulitis 2014    Periorbital  . Coronary artery disease     a. Evaluated by Dr. Stanford Breed 2012 - nonobstructive 2007-2008 by cath. b. Abnormal stress test 10/2014 but cath deferred temporarily due to thrombocytopenia. c. 10/2014: readm with AF-RVR/NSTEMI - s/p BMS to mLCx 11/04/14 (mod dz in RCA);  d. relook cath 01/2015 and 06/02/2015 patent stent, nonobs CAD, EF 55-65%.  . Back pain, lumbosacral 2014  . Thrombocytopenia ([place])     a. Onset unclear, but noted 2012. ? 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 s/p prednisone, IVIG.  Marland Kitchen Hyperlipidemia   . Obesity   .  Elevated LFTs   . Fatty liver US - 2012  . PAF (paroxysmal atrial fibrillation) ([place])     a. Episode 10/2014 at time of NSTEMI, spont converted to NSR. Observation for further episodes (not on anticoag at present time due to [facility] = 1, thrombocytopenia).  . Sinus bradycardia   . OSA (obstructive sleep apnea)     severe with AHI 56/hr with successful CPAP titration to 18cm H2O  . Morbid obesity [facility])    Past Surgical History  Procedure Laterality Date  . Tonsillectomy  1960's  . Cholecystectomy  2007  . Knee arthroplasty  1970's  . Facial cosmetic surgery  2014  . Cardiac catheterization    . Left heart catheterization with coronary angiogram N/A 11/04/2014    Procedure: LEFT HEART CATHETERIZATION WITH CORONARY ANGIOGRAM;  Surgeon: Jettie Booze, MD;  Location: [facility] CATH LAB;  Service: Cardiovascular;  Laterality: N/A;  . Left heart catheterization with coronary angiogram N/A 01/28/2015    Procedure: LEFT HEART CATHETERIZATION WITH CORONARY ANGIOGRAM;  Surgeon: Sherren Mocha, MD;  Location: [facility] CATH LAB;  Service: Cardiovascular;  Laterality: N/A;  . Cardiac catheterization N/A 06/02/2015    Procedure: Left Heart Cath and Coronary Angiography;  Surgeon: Jettie Booze, MD;  Location: [place] CV LAB;  Service: Cardiovascular;  Laterality: N/A;   Family History  Problem Relation Age of Onset  . Arthritis Maternal Grandmother   . Heart disease Sister   . Diabetes Sister   . Cancer - Ovarian Sister   . Heart disease      Maternal/paternal grandparents  .  Cirrhosis Mother   . Heart attack Mother   . Heart attack Maternal Aunt   . Hypertension Mother   . Hypertension Father   . Stroke Father    Social History  Substance Use Topics  . Smoking status: Current Some Day Smoker -- 0.20 packs/day for 43 years    Types: Cigarettes  . Smokeless tobacco: Never Used     Comment: 11/28/2012 "chew occasionally"  Quit for 2.5 months in early 2016  . Alcohol Use: 0.0 oz/week    0  Standard drinks or equivalent per week     Comment: 11/28/2012 "beer q 6 months or so"    Review of Systems 10/14 systems reviewed and are negative other than those stated in the HPI   Allergies  Review of patient's allergies indicates no known allergies.  Home Medications   Prior to Admission medications   Medication Sig Start Date End Date Taking? Authorizing Provider  aspirin 81 MG tablet Take 1 tablet (81 mg total) by mouth daily. 10/31/14   Dayna N Dunn, PA-C  atorvastatin (LIPITOR) 10 MG tablet Take 1 tablet (10 mg total) by mouth every evening. 12/16/14   Sherren Mocha, MD  buPROPion (WELLBUTRIN XL) 150 MG 24 hr tablet Take 150 mg by mouth daily.    Historical Provider, MD  clindamycin (CLEOCIN) 300 MG capsule Take 1 capsule (300 mg total) by mouth 4 (four) times daily. 10/03/15   Forde Dandy, MD  clopidogrel (PLAVIX) 75 MG tablet Take 1 tablet (75 mg total) by mouth daily. 06/10/15   Rogelia Mire, NP  cyclobenzaprine (FLEXERIL) 10 MG tablet Take 1 tablet (10 mg total) by mouth 3 (three) times daily as needed for muscle spasms. 09/14/15   Rosemarie Ax, MD  HYDROcodone-acetaminophen (NORCO/VICODIN) 5-325 MG tablet Take 1 tablet by mouth every 6 (six) hours as needed for moderate pain or severe pain. 10/03/15   Forde Dandy, MD  isosorbide mononitrate (IMDUR) 30 MG 24 hr tablet Take 1 tablet (30 mg total) by mouth daily. 01/23/15   Sherren Mocha, MD  metoprolol succinate (TOPROL-XL) 25 MG 24 hr tablet Take 0.5 tablets (12.5 mg total) by mouth daily. 02/26/15   Sherren Mocha, MD  nitroGLYCERIN (NITROSTAT) 0.4 MG SL tablet Place 1 tablet (0.4 mg total) under the tongue every 5 (five) minutes as needed for chest pain (up to 3 doses). 10/31/14   Dayna N Dunn, PA-C  pantoprazole (PROTONIX) 40 MG tablet Take 1 tablet (40 mg total) by mouth daily. 10/31/14   Dayna N Dunn, PA-C   BP 117/54 mmHg  Pulse 90  Temp(Src) 98.7 F (37.1 C) (Oral)  Resp 16  Ht 6\' 3"  (1.905 m)  Wt 300 lb  6.4 oz (136.261 kg)  BMI 37.55 kg/m2  SpO2 93% Physical Exam Physical Exam  Nursing note and vitals reviewed. Constitutional: Well developed, well nourished, non-toxic, and in no acute distress Head: Normocephalic and atraumatic.  Mouth/Throat: Oropharynx is clear and moist.  Neck: Normal range of motion. Neck supple.  Cardiovascular: Normal rate and regular rhythm.  +1 DP pulses bilaterally. Pulmonary/Chest: Effort normal and breath sounds normal.  Abdominal: Soft. There is no tenderness. There is no rebound and no guarding.  Musculoskeletal: Non-pitting edema of the left lower extremity greater than the right lower extremity. Overlying left shin is erythema extending back to the calf with scattered petechiae. Warm and tender to touch. Compartments are soft.  Neurological: Alert, no facial droop, fluent speech, moves all extremities symmetrically, sensation to  light touch in tact throughout. Skin: Skin is warm and dry.  Psychiatric: Cooperative  ED Course  Procedures (including critical care time) Labs Review Labs Reviewed  CBC WITH DIFFERENTIAL/PLATELET - Abnormal; Notable for the following:    RBC 3.94 (*)    Hemoglobin 12.4 (*)    HCT 36.2 (*)    Platelets 38 (*)    All other components within normal limits  COMPREHENSIVE METABOLIC PANEL - Abnormal; Notable for the following:    Calcium 8.6 (*)    Albumin 3.2 (*)    AST 106 (*)    ALT 83 (*)    Total Bilirubin 1.7 (*)    All other components within normal limits  URINALYSIS W MICROSCOPIC - Abnormal; Notable for the following:    Color, Urine AMBER (*)    APPearance CLOUDY (*)    Hgb urine dipstick SMALL (*)    Bilirubin Urine SMALL (*)    Urobilinogen, UA 4.0 (*)    Bacteria, UA MANY (*)    Crystals CA OXALATE CRYSTALS (*)    All other components within normal limits  URINE CULTURE  I-STAT CG4 LACTIC ACID, ED  I-STAT CG4 LACTIC ACID, ED    I have personally reviewed and evaluated these images and lab results as  part of my medical decision-making.    MDM   Final diagnoses:  Cellulitis of left lower extremity  Leg swelling    In short, this is a 59 year old male with history of CAD, obesity, HLD, and OSA who presents with pain and redness to the left lower extremity. Afebrile and hemodynamically stable on presentation. Neurovascularly in tact LLE. There is erythema, warmth, and tenderness of the left lower leg c/f cellulitis. Compartments are soft. Basic blood work showing normal lactate and no leukocytosis. Overall no concern for systemic illness. Slightly worsening thrombocytopenia from baseline and baseline transaminitis also noted on blood work. Given dose of clindamycin for cellulitis, which he reports he has had good response to in the past. Felt appropriate for outpatient management. Has follow-up with PCP next week for recheck. Also sent for outpatient DVT US tomorrow, but will not treat empirically as likely other etiology. Strict return and follow-up instructions are reviewed. He expressed understanding of all discharge instructions and felt comfortable with the plan of care.  Forde Dandy, MD 10/03/15 [phone]

## 2015-10-02 NOTE — ED Notes (Signed)
Pt here for cellulitis to his left lower leg. Noticed it really started hurting early this morning and it's weeping. Called his MD office and said they would call back. Called back at 1700 and told him they could see him in 4 days.

## 2015-10-02 NOTE — Telephone Encounter (Signed)
Pt called and really needs to speak to Dr. Raeford Razor. He said his leg has a knot and he is worried. jw

## 2015-10-03 ENCOUNTER — Ambulatory Visit (HOSPITAL_COMMUNITY)
Admission: RE | Admit: 2015-10-03 | Discharge: 2015-10-03 | Disposition: A | Discharge status: Home / Self Care | Payer: Medicaid Other | Source: Ambulatory Visit | Attending: Emergency Medicine | Admitting: Emergency Medicine

## 2015-10-03 DIAGNOSIS — R609 Edema, unspecified: Secondary | ICD-10-CM | POA: Diagnosis not present

## 2015-10-03 DIAGNOSIS — E785 Hyperlipidemia, unspecified: Secondary | ICD-10-CM | POA: Insufficient documentation

## 2015-10-03 DIAGNOSIS — Z6837 Body mass index (BMI) 37.0-37.9, adult: Secondary | ICD-10-CM | POA: Diagnosis not present

## 2015-10-03 DIAGNOSIS — M79605 Pain in left leg: Secondary | ICD-10-CM | POA: Diagnosis present

## 2015-10-03 DIAGNOSIS — M79609 Pain in unspecified limb: Secondary | ICD-10-CM | POA: Diagnosis not present

## 2015-10-03 LAB — CBC WITH DIFFERENTIAL/PLATELET
BASOS ABS: 0 10*3/uL (ref 0.0–0.1)
BASOS PCT: 0 %
Eosinophils Absolute: 0.1 10*3/uL (ref 0.0–0.7)
Eosinophils Relative: 1 %
HCT: 36.2 % — ABNORMAL LOW (ref 39.0–52.0)
HEMOGLOBIN: 12.4 g/dL — AB (ref 13.0–17.0)
LYMPHS PCT: 20 %
Lymphs Abs: 1 10*3/uL (ref 0.7–4.0)
MCH: 31.5 pg (ref 26.0–34.0)
MCHC: 34.3 g/dL (ref 30.0–36.0)
MCV: 91.9 fL (ref 78.0–100.0)
Monocytes Absolute: 0.7 10*3/uL (ref 0.1–1.0)
Monocytes Relative: 14 %
NEUTROS PCT: 65 %
Neutro Abs: 3.2 10*3/uL (ref 1.7–7.7)
Platelets: 38 10*3/uL — ABNORMAL LOW (ref 150–400)
RBC: 3.94 MIL/uL — AB (ref 4.22–5.81)
RDW: 15 % (ref 11.5–15.5)
WBC: 5 10*3/uL (ref 4.0–10.5)

## 2015-10-03 LAB — URINALYSIS W MICROSCOPIC (NOT AT ARMC)
Glucose, UA: NEGATIVE mg/dL
Ketones, ur: NEGATIVE mg/dL
Leukocytes, UA: NEGATIVE
Nitrite: NEGATIVE
PH: 6.5 (ref 5.0–8.0)
PROTEIN: NEGATIVE mg/dL
Specific Gravity, Urine: 1.025 (ref 1.005–1.030)
Urobilinogen, UA: 4 mg/dL — ABNORMAL HIGH (ref 0.0–1.0)

## 2015-10-03 LAB — COMPREHENSIVE METABOLIC PANEL
ALBUMIN: 3.2 g/dL — AB (ref 3.5–5.0)
ALK PHOS: 71 U/L (ref 38–126)
ALT: 83 U/L — AB (ref 17–63)
AST: 106 U/L — AB (ref 15–41)
Anion gap: 7 (ref 5–15)
BILIRUBIN TOTAL: 1.7 mg/dL — AB (ref 0.3–1.2)
BUN: 10 mg/dL (ref 6–20)
CO2: 23 mmol/L (ref 22–32)
CREATININE: 0.91 mg/dL (ref 0.61–1.24)
Calcium: 8.6 mg/dL — ABNORMAL LOW (ref 8.9–10.3)
Chloride: 108 mmol/L (ref 101–111)
GFR calc Af Amer: >60 mL/min (ref >60)
GFR calc non Af Amer: >60 mL/min (ref >60)
Glucose, Bld: 97 mg/dL (ref 65–99)
POTASSIUM: 3.8 mmol/L (ref 3.5–5.1)
Sodium: 138 mmol/L (ref 135–145)
TOTAL PROTEIN: 6.5 g/dL (ref 6.5–8.1)

## 2015-10-03 MED ORDER — HYDROCODONE-ACETAMINOPHEN 5-325 MG PO TABS: 1.0000 | ORAL_TABLET | Freq: Four times a day (QID) | ORAL | Status: DC | PRN

## 2015-10-03 MED ORDER — CLINDAMYCIN HCL 300 MG PO CAPS: 300.0000 mg | ORAL_CAPSULE | Freq: Four times a day (QID) | ORAL | Status: DC

## 2015-10-03 NOTE — Progress Notes (Signed)
VASCULAR LAB PRELIMINARY  PRELIMINARY  PRELIMINARY  PRELIMINARY  Left lower extremity venous duplex completed.    Preliminary report:  There is no DVT or SVT noted in the left lower extremity.  There are enlarged lymph nodes noted in the left groin.    Ashely Goosby, RVT 10/03/2015, 9:02 AM

## 2015-10-03 NOTE — Discharge Instructions (Signed)
IMPORTANT PATIENT INSTRUCTIONS:  You have been scheduled for an Outpatient Vascular Study at [facility].    If tomorrow is a Saturday or Sunday, please go to the [facility] Emergency Department Registration Desk at 8 am tomorrow morning and tell them you are there for a vascular study.  If tomorrow is a weekday (Monday-Friday), please go to [facility] Admitting Department at 8 am and tell them you are  there for a vascular study.    There is an ultrasound ordered for your left leg to rule out blood clot. Please follow the instructions above for tomorrow. Please take antibiotics as prescribed. Return without fail for worsening symptoms, including worsening swelling/pain/rednesss of the leg, fevers and sweats despite antibiotics, vomiting and unable to keep down food/fluids, or any other symptoms concerning to you. Please follow-up with your primary care provider next week to make sure your symptoms are improving.   Cellulitis Cellulitis is an infection of the skin and the tissue under the skin. The infected area is usually red and tender. This happens most often in the arms and lower legs. HOME CARE   Take your antibiotic medicine as told. Finish the medicine even if you start to feel better.  Keep the infected arm or leg raised (elevated).  Put a warm cloth on the area up to 4 times per day.  Only take medicines as told by your doctor.  Keep all doctor visits as told. GET HELP IF:  You see red streaks on the skin coming from the infected area.  Your red area gets bigger or turns a dark color.  Your bone or joint under the infected area is painful after the skin heals.  Your infection comes back in the same area or different area.  You have a puffy (swollen) bump in the infected area.  You have new symptoms.  You have a fever. GET HELP RIGHT AWAY IF:   You feel very sleepy.  You throw up (vomit) or have watery poop (diarrhea).  You feel sick and have muscle aches  and pains.   This information is not intended to replace advice given to you by your health care provider. Make sure you discuss any questions you have with your health care provider.   Document Released: 04/25/2008 Document Revised: 07/29/2015 Document Reviewed: 01/23/2012 Elsevier Interactive Patient Education [employer].

## 2015-10-04 LAB — URINE CULTURE

## 2015-10-05 NOTE — Telephone Encounter (Signed)
Spoke with patient about his swollen leg. He went to ED and diagnosed with cellulitis and placed on antibiotics. He will follow up to make sure resolution is occurring on current regimen.   Rosemarie Ax, MD PGY-3, [facility] 10/05/2015, 9:38 AM

## 2015-10-07 ENCOUNTER — Encounter: Payer: Self-pay | Admitting: Internal Medicine

## 2015-10-07 ENCOUNTER — Ambulatory Visit (INDEPENDENT_AMBULATORY_CARE_PROVIDER_SITE_OTHER): Payer: Self-pay | Admitting: Internal Medicine

## 2015-10-07 ENCOUNTER — Encounter: Payer: Self-pay | Admitting: Cardiology

## 2015-10-07 VITALS — BP 150/73 | HR 90 | Temp 98.4°F | Wt 298.8 lb

## 2015-10-07 DIAGNOSIS — L03116 Cellulitis of left lower limb: Secondary | ICD-10-CM

## 2015-10-07 DIAGNOSIS — M7989 Other specified soft tissue disorders: Secondary | ICD-10-CM

## 2015-10-07 DIAGNOSIS — I5030 Unspecified diastolic (congestive) heart failure: Secondary | ICD-10-CM | POA: Insufficient documentation

## 2015-10-07 MED ORDER — DOXYCYCLINE HYCLATE 100 MG PO TABS: 100.0000 mg | ORAL_TABLET | Freq: Two times a day (BID) | ORAL | Status: DC

## 2015-10-07 NOTE — Patient Instructions (Signed)
Mr. Vincent Ochoa,  It was nice to meet you today. For your leg infection, I am prescribing doxycyline to take twice a day. Continue to take your clindamycin until done with pills. Take 20 mg lasix (your water pill) twice a day. Continue to elevate your leg.   Future Appointments Date Time Provider [place]  10/09/2015 4:15 PM Leeanne Rio, MD [facility]  03/14/2016 2:15 PM [facility] LAB 1 [facility] None  03/14/2016 2:45 PM Heath Lark, MD [facility] None    Cellulitis Cellulitis is an infection of the skin and the tissue under the skin. The infected area is usually red and tender. This happens most often in the arms and lower legs. HOME CARE   Take your antibiotic medicine as told. Finish the medicine even if you start to feel better.  Keep the infected arm or leg raised (elevated).  Put a warm cloth on the area up to 4 times per day.  Only take medicines as told by your doctor.  Keep all doctor visits as told. GET HELP IF:  You see red streaks on the skin coming from the infected area.  Your red area gets bigger or turns a dark color.  Your bone or joint under the infected area is painful after the skin heals.  Your infection comes back in the same area or different area.  You have a puffy (swollen) bump in the infected area.  You have new symptoms.  You have a fever. GET HELP RIGHT AWAY IF:   You feel very sleepy.  You throw up (vomit) or have watery poop (diarrhea).  You feel sick and have muscle aches and pains.   This information is not intended to replace advice given to you by your health care provider. Make sure you discuss any questions you have with your health care provider.   Document Released: 04/25/2008 Document Revised: 07/29/2015 Document Reviewed: 01/23/2012 Elsevier Interactive Patient Education [employer].

## 2015-10-07 NOTE — Assessment & Plan Note (Signed)
-  Prescribed doxycycline 100 mg BID x 10 days. Counseled to take while finishing course of clindamycin.  -Circumscribed area of erythema with skin marker -Scheduled follow-up on 11/18 to determine whether patient has improved or failed outpatient management -Strict return precautions provided

## 2015-10-07 NOTE — Assessment & Plan Note (Signed)
-  Encouraged patient to continue to elevate his legs. -Recommended taking lasix 20 mg BID (as wife says he has been taking daily lasix) until follow-up for bilateral leg swelling. -Patient may benefit from restarting chronic treatment with lasix.

## 2015-10-07 NOTE — Progress Notes (Signed)
Subjective: Vincent Ochoa is a 59 y.o. male patient of Clearance Coots, MD, presenting for check-up of cellulitis.  Cellulitis of left leg: -Diagnosed 10/02/15 in the Emergency Department and prescribed clindamycin (300 mg q6h x 7 days, started on the 12th) -Has not had trouble taking clindamycin and is appropriately on day 5 of treatment -LE venous dopplers performed 10/03/15, negative for DVT or SVT -Patient feels swelling has worsened but cannot comment on erythema -He does report some throbbing pain; has only taken Tylenol #3 once -Pain is worse when he is doing chores around his [place] -He has been elevating his legs while sleeping -Had emesis day prior to diagnosis but not since -Decreased appetite but able to keep solids and liquids down -Reports subjective fever with chills -Also reports some coughing with mucus production -Hx of left leg cellulitis 1 year ago, treated outpatient with daily IV antibiotics per patient  Diastolic heart failure: -Grade 2 diastolic dysfunction seen on ECHO 10/30/15 -Over the phone, patient's wife said he has lasix 20 mg he takes daily. However, this medication is documented as discontinued in the EMR.  -No change in weight.   CAD: -s/p 1 stent and 2 catheterizations, per patient  OSA: -Poor sleep. Waiting for new CPAP mask.   - ROS: See above.  - Current smoker  Objective: BP 150/73 mmHg  Pulse 90  Temp(Src) 98.4 F (36.9 C) (Oral)  Wt 298 lb 12.8 oz (135.535 kg)  SpO2 97% Gen: Well-appearing, obese, 59 y.o. male in no distress Cardiac: RRR, S1, S2, no m/r/g Pulm: Mild basilar crackles MSK: 2+ pitting edema of the right LE around the ankle and lower shin, 3+ pitting edema across left LE, especially ankle and whole shin; erythema of left shin extending fully around calf; LE warm to touch and tender with assessment for pitting    Assessment/Plan: Vincent Ochoa is a 59 y.o. male here for follow-up of left leg cellulitis. Swelling has  worsened, and patient has some subjective systemic symptoms. Afebrile, normotensive and not tachycardiac on exam. Difficult to determine progression of erythema given this is examiner's first assessment.   Return in 2 days for reassessment.   Cellulitis of leg, left -Prescribed doxycycline 100 mg BID x 10 days. Counseled to take while finishing course of clindamycin.  -Circumscribed area of erythema with skin marker -Scheduled follow-up on 11/18 to determine whether patient has improved or failed outpatient management -Strict return precautions provided  Leg swelling -Encouraged patient to continue to elevate his legs. -Recommended taking lasix 20 mg BID (as wife says he has been taking daily lasix) until follow-up for bilateral leg swelling. -Patient may benefit from restarting chronic treatment with lasix.    Olene Floss, MD [facility], PGY-1

## 2015-10-09 ENCOUNTER — Ambulatory Visit (INDEPENDENT_AMBULATORY_CARE_PROVIDER_SITE_OTHER): Payer: Self-pay | Admitting: Family Medicine

## 2015-10-09 ENCOUNTER — Encounter: Payer: Self-pay | Admitting: Family Medicine

## 2015-10-09 VITALS — BP 164/64 | HR 83 | Temp 98.2°F | Ht 75.0 in | Wt 297.0 lb

## 2015-10-09 DIAGNOSIS — L03116 Cellulitis of left lower limb: Secondary | ICD-10-CM

## 2015-10-09 DIAGNOSIS — R059 Cough, unspecified: Secondary | ICD-10-CM

## 2015-10-09 DIAGNOSIS — R05 Cough: Secondary | ICD-10-CM
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
## 2015-10-09 NOTE — Patient Instructions (Signed)
Okay to take claritin and try over the counter flonase Return if leg worsens Elizebeth Koller out course of antibiotics Keep leg elevated  Be well, Dr. Ardelia Mems  Cellulitis Cellulitis is an infection of the skin and the tissue beneath it. The infected area is usually red and tender. Cellulitis occurs most often in the arms and lower legs.  CAUSES  Cellulitis is caused by bacteria that enter the skin through cracks or cuts in the skin. The most common types of bacteria that cause cellulitis are staphylococci and streptococci. SIGNS AND SYMPTOMS   Redness and warmth.  Swelling.  Tenderness or pain.  Fever. DIAGNOSIS  Your health care provider can usually determine what is wrong based on a physical exam. Blood tests may also be done. TREATMENT  Treatment usually involves taking an antibiotic medicine. HOME CARE INSTRUCTIONS   Take your antibiotic medicine as directed by your health care provider. Finish the antibiotic even if you start to feel better.  Keep the infected arm or leg elevated to reduce swelling.  Apply a warm cloth to the affected area up to 4 times per day to relieve pain.  Take medicines only as directed by your health care provider.  Keep all follow-up visits as directed by your health care provider. SEEK MEDICAL CARE IF:   You notice red streaks coming from the infected area.  Your red area gets larger or turns dark in color.  Your bone or joint underneath the infected area becomes painful after the skin has healed.  Your infection returns in the same area or another area.  You notice a swollen bump in the infected area.  You develop new symptoms.  You have a fever. SEEK IMMEDIATE MEDICAL CARE IF:   You feel very sleepy.  You develop vomiting or diarrhea.  You have a general ill feeling (malaise) with muscle aches and pains.   This information is not intended to replace advice given to you by your health care provider. Make sure you discuss any  questions you have with your health care provider.   Document Released: 08/17/2005 Document Revised: 07/29/2015 Document Reviewed: 01/23/2012 Elsevier Interactive Patient Education [employer].

## 2015-10-09 NOTE — Assessment & Plan Note (Signed)
Improving.  -finish course of clindamycin and doxycycline -elevate leg when sitting -follow up if worsens or does not continue to improve

## 2015-10-12 ENCOUNTER — Telehealth: Payer: Self-pay | Admitting: *Deleted

## 2015-10-12 MED ORDER — NAPROXEN 500 MG PO TABS: 500.0000 mg | ORAL_TABLET | Freq: Two times a day (BID) | ORAL | Status: DC

## 2015-10-12 NOTE — Telephone Encounter (Signed)
Patient in nurse clinic for triage concern blood in urine.  Patient stated he noticed some blood over the weekend.  This morning he was urinating bright red blood.  Precept with patient's PCP, patient has a confirmed kidney stone.  Patient should drink plenty of water and will treat the pain.  Verbal order for Naproxen 500 mg 1 PO BID #14, no refills sent in to patient's pharmacy.  Patient informed to call for an appointment if pain is not relieved and drink plenty of water.  Patient verbalized understanding.  Derl Barrow, RN

## 2015-11-18 ENCOUNTER — Encounter: Payer: Self-pay | Admitting: Family Medicine

## 2015-11-18 ENCOUNTER — Ambulatory Visit (INDEPENDENT_AMBULATORY_CARE_PROVIDER_SITE_OTHER): Payer: Medicaid Other | Admitting: Family Medicine

## 2015-11-18 VITALS — BP 133/53 | HR 72 | Temp 98.4°F | Ht 75.0 in | Wt 303.7 lb

## 2015-11-18 DIAGNOSIS — L03116 Cellulitis of left lower limb: Secondary | ICD-10-CM

## 2015-11-18 DIAGNOSIS — M7989 Other specified soft tissue disorders: Secondary | ICD-10-CM

## 2015-11-18 LAB — D-DIMER, QUANTITATIVE: D-Dimer, Quant: 0.86 ug/mL-FEU — ABNORMAL HIGH (ref 0.00–0.50)

## 2015-11-18 MED ORDER — SULFAMETHOXAZOLE-TRIMETHOPRIM 800-160 MG PO TABS: 1.0000 | ORAL_TABLET | Freq: Two times a day (BID) | ORAL | Status: DC

## 2015-11-18 NOTE — Assessment & Plan Note (Signed)
Most likely a recurrence of cellulitis from last month. He was on a combination of doxycycline and clindamycin in order to resolve the infection He has a distant history of a MRSA infection (6 years ago) in his left toe that required IV antibiotics and was treated outpatient Left lower extremity is also more swollen than right with concerns for DVT but having normal saturation today and no hemoptysis Less likely to be stasis dermatitis with swelling and pain with palpation - Try Bactrim for 5 day course. Need to monitor his platelets if fails to resolve - D-dimer. If positive will need a CTA and will call with results. - Counseled on eating to elevate his legs above his heart - If fails to improve with Bactrim may need to change antibiotics to Keflex and doxycycline versus inpatient management  - Discussed with Dr. Erin Hearing

## 2015-11-18 NOTE — Patient Instructions (Addendum)
Thank you for coming in,   Please follow up with me in one week if your symptoms are not improving.   I will call you with the results from today.   Please try to keep your legs elevated as much as possible.   Please bring all of your medications with you to each visit.   Sign up for My Chart to have easy access to your labs results, and communication with your Primary care physician   Please feel free to call with any questions or concerns at any time, at [phone]. --Dr. Raeford Razor  Cellulitis Cellulitis is an infection of the skin and the tissue beneath it. The infected area is usually red and tender. Cellulitis occurs most often in the arms and lower legs.  CAUSES  Cellulitis is caused by bacteria that enter the skin through cracks or cuts in the skin. The most common types of bacteria that cause cellulitis are staphylococci and streptococci. SIGNS AND SYMPTOMS   Redness and warmth.  Swelling.  Tenderness or pain.  Fever. DIAGNOSIS  Your health care provider can usually determine what is wrong based on a physical exam. Blood tests may also be done. TREATMENT  Treatment usually involves taking an antibiotic medicine. HOME CARE INSTRUCTIONS   Take your antibiotic medicine as directed by your health care provider. Finish the antibiotic even if you start to feel better.  Keep the infected arm or leg elevated to reduce swelling.  Apply a warm cloth to the affected area up to 4 times per day to relieve pain.  Take medicines only as directed by your health care provider.  Keep all follow-up visits as directed by your health care provider. SEEK MEDICAL CARE IF:   You notice red streaks coming from the infected area.  Your red area gets larger or turns dark in color.  Your bone or joint underneath the infected area becomes painful after the skin has healed.  Your infection returns in the same area or another area.  You notice a swollen bump in the infected area.  You  develop new symptoms.  You have a fever. SEEK IMMEDIATE MEDICAL CARE IF:   You feel very sleepy.  You develop vomiting or diarrhea.  You have a general ill feeling (malaise) with muscle aches and pains.   This information is not intended to replace advice given to you by your health care provider. Make sure you discuss any questions you have with your health care provider.   Document Released: 08/17/2005 Document Revised: 07/29/2015 Document Reviewed: 01/23/2012 Elsevier Interactive Patient Education [employer].

## 2015-11-18 NOTE — Progress Notes (Signed)
Subjective:    Vincent Ochoa - 59 y.o. male MRN [id number]  Date of birth: 07-18-1956  HPI  Vincent Ochoa is here for cellulitis. Hx of HFpEF grade 2 DD and thrombocytopenia.   Cellulitis:  Was seen last month for cellulitis  Completed the clindamycin and doxycycline  The swelling never completely resolved in his left leg.  Felt the redness and warmth completely resolved with the antibiotics.   Having pain and feels like fire.  Has had a history of skin infection with MRSA in his toes  Felt warm and having chills  Having some coughing and shortness and breath  Having malaise.    Health Maintenance:  Health Maintenance Due  Topic Date Due  . COLONOSCOPY  02/27/2006    -  reports that he has been smoking Cigarettes.  He has a 8.6 pack-year smoking history. He has never used smokeless tobacco. - Review of Systems: Per HPI. - Past Medical History: Patient Active Problem List   Diagnosis Date Noted  . Diastolic heart failure (Vincent Ochoa) 10/07/2015  . Cellulitis of leg, left 10/07/2015  . Leukopenia 09/15/2015  . Acrochordon 07/18/2015  . Coronary artery disease   . PAF (paroxysmal atrial fibrillation) (Vincent Ochoa)   . Morbid obesity (Vincent Ochoa)   . Leg swelling 04/23/2015  . Transaminitis 04/23/2015  . Knee pain, bilateral 03/05/2015  . Depression 01/08/2015  . OSA (obstructive sleep apnea)   . Tobacco abuse 12/08/2014  . Physical deconditioning 12/08/2014  . Paroxysmal atrial fibrillation (Vincent Ochoa) 11/05/2014  . Hyperlipidemia LDL goal <70 11/05/2014  . Obesity 11/05/2014  . NSTEMI (non-ST elevated myocardial infarction) (Vincent Ochoa)   . Back pain, lumbosacral 01/04/2013  . Thrombocytopenia (Vincent Ochoa) 11/29/2012  . Decreased libido 08/15/2011  . GERD (gastroesophageal reflux disease) 08/15/2011  . CAD (coronary artery disease) 07/29/2011  . Psoriasis 07/29/2011   - Medications: reviewed and updated Current Outpatient Prescriptions  Medication Sig Dispense Refill  . aspirin 81 MG tablet Take 1  tablet (81 mg total) by mouth daily.    Marland Kitchen atorvastatin (LIPITOR) 10 MG tablet Take 1 tablet (10 mg total) by mouth every evening. 90 tablet 3  . buPROPion (WELLBUTRIN XL) 150 MG 24 hr tablet Take 150 mg by mouth daily.    . clindamycin (CLEOCIN) 300 MG capsule Take 1 capsule (300 mg total) by mouth 4 (four) times daily. 28 capsule 0  . clopidogrel (PLAVIX) 75 MG tablet Take 1 tablet (75 mg total) by mouth daily. 30 tablet 7  . cyclobenzaprine (FLEXERIL) 10 MG tablet Take 1 tablet (10 mg total) by mouth 3 (three) times daily as needed for muscle spasms. 90 tablet 1  . doxycycline (VIBRA-TABS) 100 MG tablet Take 1 tablet (100 mg total) by mouth 2 (two) times daily. 20 tablet 0  . HYDROcodone-acetaminophen (NORCO/VICODIN) 5-325 MG tablet Take 1 tablet by mouth every 6 (six) hours as needed for moderate pain or severe pain. 10 tablet 0  . isosorbide mononitrate (IMDUR) 30 MG 24 hr tablet Take 1 tablet (30 mg total) by mouth daily. 30 tablet 11  . metoprolol succinate (TOPROL-XL) 25 MG 24 hr tablet Take 0.5 tablets (12.5 mg total) by mouth daily. 30 tablet 11  . naproxen (NAPROSYN) 500 MG tablet Take 1 tablet (500 mg total) by mouth 2 (two) times daily with a meal. 14 tablet 0  . nitroGLYCERIN (NITROSTAT) 0.4 MG SL tablet Place 1 tablet (0.4 mg total) under the tongue every 5 (five) minutes as needed for chest pain (up to 3  doses). 25 tablet 3  . pantoprazole (PROTONIX) 40 MG tablet Take 1 tablet (40 mg total) by mouth daily. 30 tablet 1  . sulfamethoxazole-trimethoprim (BACTRIM DS,SEPTRA DS) 800-160 MG tablet Take 1 tablet by mouth 2 (two) times daily. For a 5 day course 10 tablet 0   No current facility-administered medications for this visit.     Review of Systems See HPI     Objective:   Physical Exam BP 133/53 mmHg  Pulse 72  Temp(Src) 98.4 F (36.9 C) (Oral)  Ht 6\' 3"  (1.905 m)  Wt 303 lb 11.2 oz (137.757 kg)  BMI 37.96 kg/m2 Gen: NAD, alert, cooperative with exam,  CV: RRR, good  S1/S2, no murmur,  Resp: CTABL, no wheezes, non-labored Skin: Left lower extremity having warmth and erythema and noticeably more swollen than right. Tender to palpation over the left ankle. Erythema extends to distal left tibia. Swelling extends until proximal tibia. Right lower extremity with some tenderness to palpation over the ankle but much less swelling and limited erythema. Neuro: no gross deficits.  Psych: alert and oriented     Assessment & Plan:   Cellulitis of leg, left Most likely a recurrence of cellulitis from last month. He was on a combination of doxycycline and clindamycin in order to resolve the infection He has a distant history of a MRSA infection (6 years ago) in his left toe that required IV antibiotics and was treated outpatient Left lower extremity is also more swollen than right with concerns for DVT but having normal saturation today and no hemoptysis Less likely to be stasis dermatitis with swelling and pain with palpation - Try Bactrim for 5 day course. Need to monitor his platelets if fails to resolve - D-dimer. If positive will need a CTA and will call with results. - Counseled on eating to elevate his legs above his heart - If fails to improve with Bactrim may need to change antibiotics to Keflex and doxycycline versus inpatient management  - Discussed with Dr. Erin Hearing

## 2015-11-19 ENCOUNTER — Telehealth: Payer: Self-pay | Admitting: Family Medicine

## 2015-11-19 DIAGNOSIS — M7989 Other specified soft tissue disorders: Secondary | ICD-10-CM

## 2015-11-19 NOTE — Telephone Encounter (Signed)
Call patient and discussed lab result. Will order venous doppler   Rosemarie Ax, MD PGY-3, [facility] 11/19/2015, 8:27 AM

## 2015-11-25 ENCOUNTER — Telehealth: Payer: Self-pay | Admitting: Family Medicine

## 2015-11-25 ENCOUNTER — Other Ambulatory Visit: Payer: Self-pay | Admitting: Family Medicine

## 2015-11-25 DIAGNOSIS — M7989 Other specified soft tissue disorders: Secondary | ICD-10-CM

## 2015-11-25 NOTE — Telephone Encounter (Signed)
Patient is having trouble getting his medicaid valid for January and requested his lower venous study to be cancelled until his insurance is active.   He stated he would call to reschedule ASAP. I wanted to let you know this was cancelled for the time being and make sure it was medically okay to be postponed.

## 2015-11-26 ENCOUNTER — Encounter (HOSPITAL_COMMUNITY): Payer: Medicaid Other

## 2015-11-26 NOTE — Telephone Encounter (Signed)
Discussed with patient about the risks of not having the lower extremity dopplers performed. He understands this. Reports that his leg swelling has improved since initiating the antibiotics and so has his redness and pain. He has ordered compression stockings as well.   Rosemarie Ax, MD PGY-3, [facility] 11/26/2015, 4:08 PM

## 2015-11-27 ENCOUNTER — Ambulatory Visit: Payer: Medicaid Other | Admitting: Family Medicine

## 2016-01-01 ENCOUNTER — Encounter (HOSPITAL_COMMUNITY): Payer: Self-pay | Admitting: Emergency Medicine

## 2016-01-01 ENCOUNTER — Observation Stay (HOSPITAL_COMMUNITY)
Admission: EM | Admit: 2016-01-01 | Discharge: 2016-01-03 | Disposition: A | Discharge status: Home / Self Care | Payer: Medicaid Other | Attending: Family Medicine | Admitting: Family Medicine

## 2016-01-01 ENCOUNTER — Emergency Department (HOSPITAL_COMMUNITY): Payer: Medicaid Other

## 2016-01-01 DIAGNOSIS — Z6834 Body mass index (BMI) 34.0-34.9, adult: Secondary | ICD-10-CM | POA: Diagnosis not present

## 2016-01-01 DIAGNOSIS — I480 Paroxysmal atrial fibrillation: Secondary | ICD-10-CM | POA: Diagnosis not present

## 2016-01-01 DIAGNOSIS — G4733 Obstructive sleep apnea (adult) (pediatric): Secondary | ICD-10-CM | POA: Diagnosis not present

## 2016-01-01 DIAGNOSIS — I2510 Atherosclerotic heart disease of native coronary artery without angina pectoris: Secondary | ICD-10-CM | POA: Diagnosis not present

## 2016-01-01 DIAGNOSIS — F17210 Nicotine dependence, cigarettes, uncomplicated: Secondary | ICD-10-CM | POA: Insufficient documentation

## 2016-01-01 DIAGNOSIS — D696 Thrombocytopenia, unspecified: Secondary | ICD-10-CM | POA: Insufficient documentation

## 2016-01-01 DIAGNOSIS — Z79899 Other long term (current) drug therapy: Secondary | ICD-10-CM | POA: Insufficient documentation

## 2016-01-01 DIAGNOSIS — Z7902 Long term (current) use of antithrombotics/antiplatelets: Secondary | ICD-10-CM | POA: Diagnosis not present

## 2016-01-01 DIAGNOSIS — Z955 Presence of coronary angioplasty implant and graft: Secondary | ICD-10-CM | POA: Diagnosis not present

## 2016-01-01 DIAGNOSIS — E785 Hyperlipidemia, unspecified: Secondary | ICD-10-CM | POA: Insufficient documentation

## 2016-01-01 DIAGNOSIS — L409 Psoriasis, unspecified: Secondary | ICD-10-CM | POA: Diagnosis not present

## 2016-01-01 DIAGNOSIS — K219 Gastro-esophageal reflux disease without esophagitis: Secondary | ICD-10-CM | POA: Insufficient documentation

## 2016-01-01 DIAGNOSIS — I252 Old myocardial infarction: Secondary | ICD-10-CM | POA: Diagnosis not present

## 2016-01-01 DIAGNOSIS — Z7982 Long term (current) use of aspirin: Secondary | ICD-10-CM | POA: Diagnosis not present

## 2016-01-01 DIAGNOSIS — I110 Hypertensive heart disease with heart failure: Secondary | ICD-10-CM | POA: Diagnosis not present

## 2016-01-01 DIAGNOSIS — R079 Chest pain, unspecified: Secondary | ICD-10-CM | POA: Diagnosis not present

## 2016-01-01 DIAGNOSIS — I5032 Chronic diastolic (congestive) heart failure: Secondary | ICD-10-CM | POA: Insufficient documentation

## 2016-01-01 LAB — BASIC METABOLIC PANEL
ANION GAP: 10 (ref 5–15)
BUN: 6 mg/dL (ref 6–20)
CALCIUM: 8.7 mg/dL — AB (ref 8.9–10.3)
CO2: 23 mmol/L (ref 22–32)
Chloride: 105 mmol/L (ref 101–111)
Creatinine, Ser: 0.77 mg/dL (ref 0.61–1.24)
Glucose, Bld: 110 mg/dL — ABNORMAL HIGH (ref 65–99)
Potassium: 4 mmol/L (ref 3.5–5.1)
SODIUM: 138 mmol/L (ref 135–145)

## 2016-01-01 LAB — CBC
HCT: 35 % — ABNORMAL LOW (ref 39.0–52.0)
Hemoglobin: 12.3 g/dL — ABNORMAL LOW (ref 13.0–17.0)
MCH: 32.5 pg (ref 26.0–34.0)
MCHC: 35.1 g/dL (ref 30.0–36.0)
MCV: 92.3 fL (ref 78.0–100.0)
PLATELETS: 40 10*3/uL — AB (ref 150–400)
RBC: 3.79 MIL/uL — AB (ref 4.22–5.81)
RDW: 14.5 % (ref 11.5–15.5)
WBC: 4.5 10*3/uL (ref 4.0–10.5)

## 2016-01-01 LAB — I-STAT TROPONIN, ED
TROPONIN I, POC: 0.01 ng/mL (ref 0.00–0.08)
Troponin i, poc: 0.02 ng/mL (ref 0.00–0.08)

## 2016-01-01 MED ORDER — NITROGLYCERIN 0.4 MG SL SUBL
0.4000 mg | SUBLINGUAL_TABLET | SUBLINGUAL | Status: DC | PRN
  Administered 2016-01-01: 0.4 mg via SUBLINGUAL

## 2016-01-01 NOTE — ED Notes (Signed)
Pt stated he was asleep when he had an acute onset of left sided chest pain. Pt described his pain as a pressure with pain down his left arm and shortness of breath. Pt took 324 mg of ASA and two nitro pills. Pt's chest pain was relieved upon arrival to the ER.

## 2016-01-01 NOTE — ED Provider Notes (Signed)
By signing my name below, I, Emmanuella Mensah, attest that this documentation has been prepared under the direction and in the presence of Zillah, DO. Electronically Signed: Judithann Sauger, ED Scribe. 01/01/2016. 11:55 PM.   TIME SEEN: 11:47 PM  CHIEF COMPLAINT: CP   HPI:  Vincent Ochoa is a 60 y.o. male with a hx of CAD with BMS to LCx and NSTEMI in December 2015 who presents to the Emergency Department complaining of acute onset of ongoing moderate left lateral CP that radiates down left arm onset 3 hours ago. He explains his CP as "someone sitting on my chest". He reports associated diaphoresis and SOB. He adds that this episode is similar to when he needed a heart catheterization. He states that he took 2 NTG PTA with mild relief. She took 4 baby aspirin prior to arrival. Pt is currently on Plavix and aspirin. He states that he has been compliant with his medications, only missing one dose of Plavix one month ago. No nausea or vomiting.   PCP: Dr. Raeford Razor, [facility]    ROS: See HPI Constitutional: no fever  Eyes: no drainage  ENT: no runny nose   Cardiovascular:  chest pain  Resp: SOB  GI: no vomiting GU: no dysuria Integumentary: no rash  Allergy: no hives  Musculoskeletal: no leg swelling  Neurological: no slurred speech ROS otherwise negative  PAST MEDICAL HISTORY/PAST SURGICAL HISTORY:  Past Medical History  Diagnosis Date  . GERD (gastroesophageal reflux disease)   . Psoriasis   . Frequent headaches   . Facial cellulitis 2014    Periorbital  . Coronary artery disease     a. Evaluated by Dr. Stanford Breed 2012 - nonobstructive 2007-2008 by cath. b. Abnormal stress test 10/2014 but cath deferred temporarily due to thrombocytopenia. c. 10/2014: readm with AF-RVR/NSTEMI - s/p BMS to mLCx 11/04/14 (mod dz in RCA);  d. relook cath 01/2015 and 06/02/2015 patent stent, nonobs CAD, EF 55-65%.  . Back pain, lumbosacral 2014  . Thrombocytopenia ([place])     a. Onset  unclear, but noted 2012. ? 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 s/p prednisone, IVIG.  Marland Kitchen Hyperlipidemia   . Obesity   . Elevated LFTs   . Fatty liver US - 2012  . PAF (paroxysmal atrial fibrillation) ([place])     a. Episode 10/2014 at time of NSTEMI, spont converted to NSR. Observation for further episodes (not on anticoag at present time due to [facility] = 1, thrombocytopenia).  . Sinus bradycardia   . OSA (obstructive sleep apnea)     severe with AHI 56/hr with successful CPAP titration to 18cm H2O  . Morbid obesity ([place])     MEDICATIONS:  Prior to Admission medications   Medication Sig Start Date End Date Taking? Authorizing Provider  aspirin 81 MG tablet Take 1 tablet (81 mg total) by mouth daily. 10/31/14  Yes Dayna N Dunn, PA-C  buPROPion (WELLBUTRIN XL) 150 MG 24 hr tablet Take 150 mg by mouth daily.   Yes Historical Provider, MD  clopidogrel (PLAVIX) 75 MG tablet Take 1 tablet (75 mg total) by mouth daily. 06/10/15  Yes Rogelia Mire, NP  furosemide (LASIX) 20 MG tablet Take 20 mg by mouth daily.   Yes Historical Provider, MD  isosorbide mononitrate (IMDUR) 30 MG 24 hr tablet Take 1 tablet (30 mg total) by mouth daily. 01/23/15  Yes Sherren Mocha, MD  metoprolol succinate (TOPROL-XL) 25 MG 24 hr tablet Take 0.5 tablets (  12.5 mg total) by mouth daily. 02/26/15  Yes Sherren Mocha, MD  nitroGLYCERIN (NITROSTAT) 0.4 MG SL tablet Place 1 tablet (0.4 mg total) under the tongue every 5 (five) minutes as needed for chest pain (up to 3 doses). 10/31/14  Yes Dayna N Dunn, PA-C  atorvastatin (LIPITOR) 10 MG tablet Take 1 tablet (10 mg total) by mouth every evening. Patient not taking: Reported on 01/01/2016 12/16/14   Sherren Mocha, MD  clindamycin (CLEOCIN) 300 MG capsule Take 1 capsule (300 mg total) by mouth 4 (four) times daily. Patient not taking: Reported on 01/01/2016 10/03/15   Forde Dandy, MD  cyclobenzaprine (FLEXERIL) 10 MG tablet Take 1 tablet  (10 mg total) by mouth 3 (three) times daily as needed for muscle spasms. Patient not taking: Reported on 01/01/2016 09/14/15   Rosemarie Ax, MD  doxycycline (VIBRA-TABS) 100 MG tablet Take 1 tablet (100 mg total) by mouth 2 (two) times daily. Patient not taking: Reported on 01/01/2016 10/07/15   Rogue Bussing, MD  HYDROcodone-acetaminophen (NORCO/VICODIN) 5-325 MG tablet Take 1 tablet by mouth every 6 (six) hours as needed for moderate pain or severe pain. Patient not taking: Reported on 01/01/2016 10/03/15   Forde Dandy, MD  naproxen (NAPROSYN) 500 MG tablet Take 1 tablet (500 mg total) by mouth 2 (two) times daily with a meal. Patient not taking: Reported on 01/01/2016 10/12/15   Rosemarie Ax, MD  pantoprazole (PROTONIX) 40 MG tablet Take 1 tablet (40 mg total) by mouth daily. Patient not taking: Reported on 01/01/2016 10/31/14   Dayna N Dunn, PA-C  sulfamethoxazole-trimethoprim (BACTRIM DS,SEPTRA DS) 800-160 MG tablet Take 1 tablet by mouth 2 (two) times daily. For a 5 day course Patient not taking: Reported on 01/01/2016 11/18/15   Rosemarie Ax, MD    ALLERGIES:  No Known Allergies  SOCIAL HISTORY:  Social History  Substance Use Topics  . Smoking status: Current Some Day Smoker -- 0.20 packs/day for 43 years    Types: Cigarettes  . Smokeless tobacco: Never Used     Comment: 11/28/2012 "chew occasionally"  Quit for 2.5 months in early 2016  . Alcohol Use: 0.0 oz/week    0 Standard drinks or equivalent per week     Comment: 11/28/2012 "beer q 6 months or so"    FAMILY HISTORY: Family History  Problem Relation Age of Onset  . Arthritis Maternal Grandmother   . Heart disease Sister   . Diabetes Sister   . Cancer - Ovarian Sister   . Heart disease      Maternal/paternal grandparents  . Cirrhosis Mother   . Heart attack Mother   . Heart attack Maternal Aunt   . Hypertension Mother   . Hypertension Father   . Stroke Father     EXAM: BP 140/70 mmHg  Pulse 86   Temp(Src) 99.1 F (37.3 C) (Oral)  Resp 24  SpO2 97% CONSTITUTIONAL: Alert and oriented and responds appropriately to questions. Chronically ill-appearing and obese; well-nourished HEAD: Normocephalic EYES: Conjunctivae clear, PERRL ENT: normal nose; no rhinorrhea; moist mucous membranes; pharynx without lesions noted NECK: Supple, no meningismus, no LAD  CARD: RRR; S1 and S2 appreciated; no murmurs, no clicks, no rubs, no gallops RESP: Normal chest excursion without splinting or tachypnea; breath sounds clear and equal bilaterally; no wheezes, no rhonchi, no rales, no hypoxia or respiratory distress, speaking full sentences ABD/GI: Normal bowel sounds; non-distended; soft, non-tender, no rebound, no guarding, no peritoneal signs BACK:  The back appears  normal and is non-tender to palpation, there is no CVA tenderness EXT: Normal ROM in all joints; non-tender to palpation; no edema; normal capillary refill; no cyanosis, no calf tenderness or swelling    SKIN: Normal color for age and race; warm NEURO: Moves all extremities equally, sensation to light touch intact diffusely, cranial nerves II through XII intact PSYCH: The patient's mood and manner are appropriate. Grooming and personal hygiene are appropriate.    EKG Interpretation  Date/Time:  Friday January 01 2016 21:59:37 EST Ventricular Rate:  87 PR Interval:  148 QRS Duration: 98 QT Interval:  381 QTC Calculation: 458 R Axis:   74 Text Interpretation:  Sinus rhythm Probable anteroseptal infarct, old No significant change since last tracing Confirmed by Accalia Rigdon,  DO, Fredrika Canby ([id number]) on 01/01/2016 11:01:50 PM        MEDICAL DECISION MAKING: Patient here with chest pain. Has history of coronary artery disease and a bare-metal stent to left circumflex. Last cardiac catheterization in March 2016 revealed mild in-stent restenosis and 50% RCA lesion. His cardiologist is Dr. Burt Knack. Pain is almost completely resolved after 2  nitroglycerin tablets. EKG shows no new ischemic changes. First troponin is negative. Chest x-ray clear. I feel he will need admission for chest pain rule out given his risk factors and concerning story. He agrees with this plan. He is a family medicine patient.  Will give NTG to get pt CP free.  ED PROGRESS:   12:00 AM- Spoke with Dr. Avon Gully about admission. Will place orders for telemetry, observation per resident's request. The attending physician is Dr. Gwendlyn Deutscher. Care transferred to family medicine teaching service.    I personally performed the services described in this documentation, which was scribed in my presence. The recorded information has been reviewed and is accurate.   [place], DO 01/02/16 [id number]

## 2016-01-02 ENCOUNTER — Other Ambulatory Visit: Payer: Self-pay

## 2016-01-02 DIAGNOSIS — R079 Chest pain, unspecified: Secondary | ICD-10-CM | POA: Diagnosis not present

## 2016-01-02 DIAGNOSIS — I2510 Atherosclerotic heart disease of native coronary artery without angina pectoris: Secondary | ICD-10-CM | POA: Diagnosis not present

## 2016-01-02 DIAGNOSIS — L409 Psoriasis, unspecified: Secondary | ICD-10-CM | POA: Diagnosis not present

## 2016-01-02 DIAGNOSIS — I209 Angina pectoris, unspecified: Secondary | ICD-10-CM

## 2016-01-02 DIAGNOSIS — I25118 Atherosclerotic heart disease of native coronary artery with other forms of angina pectoris: Secondary | ICD-10-CM | POA: Diagnosis not present

## 2016-01-02 DIAGNOSIS — K219 Gastro-esophageal reflux disease without esophagitis: Secondary | ICD-10-CM | POA: Diagnosis not present

## 2016-01-02 LAB — COMPREHENSIVE METABOLIC PANEL
ALBUMIN: 2.8 g/dL — AB (ref 3.5–5.0)
ALT: 55 U/L (ref 17–63)
AST: 68 U/L — AB (ref 15–41)
Alkaline Phosphatase: 64 U/L (ref 38–126)
Anion gap: 7 (ref 5–15)
BUN: 8 mg/dL (ref 6–20)
CHLORIDE: 106 mmol/L (ref 101–111)
CO2: 24 mmol/L (ref 22–32)
Calcium: 8.3 mg/dL — ABNORMAL LOW (ref 8.9–10.3)
Creatinine, Ser: 0.89 mg/dL (ref 0.61–1.24)
GFR calc Af Amer: >60 mL/min (ref >60)
Glucose, Bld: 107 mg/dL — ABNORMAL HIGH (ref 65–99)
POTASSIUM: 3.8 mmol/L (ref 3.5–5.1)
SODIUM: 137 mmol/L (ref 135–145)
Total Bilirubin: 3.2 mg/dL — ABNORMAL HIGH (ref 0.3–1.2)
Total Protein: 6 g/dL — ABNORMAL LOW (ref 6.5–8.1)

## 2016-01-02 LAB — CBC
HCT: 33.7 % — ABNORMAL LOW (ref 39.0–52.0)
Hemoglobin: 11.3 g/dL — ABNORMAL LOW (ref 13.0–17.0)
MCH: 30.8 pg (ref 26.0–34.0)
MCHC: 33.5 g/dL (ref 30.0–36.0)
MCV: 91.8 fL (ref 78.0–100.0)
PLATELETS: 36 10*3/uL — AB (ref 150–400)
RBC: 3.67 MIL/uL — ABNORMAL LOW (ref 4.22–5.81)
RDW: 14.5 % (ref 11.5–15.5)
WBC: 4.8 10*3/uL (ref 4.0–10.5)

## 2016-01-02 LAB — URINALYSIS, ROUTINE W REFLEX MICROSCOPIC
BILIRUBIN URINE: NEGATIVE
Glucose, UA: NEGATIVE mg/dL
KETONES UR: NEGATIVE mg/dL
Leukocytes, UA: NEGATIVE
NITRITE: NEGATIVE
Protein, ur: NEGATIVE mg/dL
SPECIFIC GRAVITY, URINE: 1.012 (ref 1.005–1.030)
pH: 6.5 (ref 5.0–8.0)

## 2016-01-02 LAB — TROPONIN I: TROPONIN I: 0.03 ng/mL (ref <0.031)

## 2016-01-02 LAB — URINE MICROSCOPIC-ADD ON: Bacteria, UA: NONE SEEN

## 2016-01-02 LAB — INFLUENZA PANEL BY PCR (TYPE A & B)
H1N1 flu by pcr: NOT DETECTED
INFLAPCR: NEGATIVE
INFLBPCR: NEGATIVE

## 2016-01-02 MED ORDER — ASPIRIN 81 MG PO CHEW
81.0000 mg | CHEWABLE_TABLET | Freq: Every day | ORAL | Status: DC
  Administered 2016-01-02 – 2016-01-03 (×2): 81 mg via ORAL
  Filled 2016-01-02 (×2): qty 1

## 2016-01-02 MED ORDER — BUPROPION HCL ER (XL) 150 MG PO TB24
150.0000 mg | ORAL_TABLET | Freq: Every day | ORAL | Status: DC
  Administered 2016-01-02 – 2016-01-03 (×2): 150 mg via ORAL
  Filled 2016-01-02 (×3): qty 1

## 2016-01-02 MED ORDER — ONDANSETRON HCL 4 MG/2ML IJ SOLN: 4.0000 mg | Freq: Four times a day (QID) | INTRAMUSCULAR | Status: DC | PRN

## 2016-01-02 MED ORDER — FUROSEMIDE 20 MG PO TABS
20.0000 mg | ORAL_TABLET | Freq: Every day | ORAL | Status: DC
  Administered 2016-01-02 – 2016-01-03 (×2): 20 mg via ORAL
  Filled 2016-01-02 (×2): qty 1

## 2016-01-02 MED ORDER — ISOSORBIDE MONONITRATE ER 30 MG PO TB24
30.0000 mg | ORAL_TABLET | Freq: Every day | ORAL | Status: DC
  Administered 2016-01-02 – 2016-01-03 (×2): 30 mg via ORAL
  Filled 2016-01-02 (×2): qty 1

## 2016-01-02 MED ORDER — ACETAMINOPHEN 325 MG PO TABS
650.0000 mg | ORAL_TABLET | ORAL | Status: DC | PRN
  Administered 2016-01-02 – 2016-01-03 (×3): 650 mg via ORAL
  Filled 2016-01-02 (×3): qty 2

## 2016-01-02 MED ORDER — CLOPIDOGREL BISULFATE 75 MG PO TABS
75.0000 mg | ORAL_TABLET | Freq: Every day | ORAL | Status: DC
Start: 2016-01-02 — End: 2016-01-03
  Administered 2016-01-02 – 2016-01-03 (×2): 75 mg via ORAL
  Filled 2016-01-02 (×2): qty 1

## 2016-01-02 MED ORDER — METOPROLOL SUCCINATE ER 25 MG PO TB24
12.5000 mg | ORAL_TABLET | Freq: Every day | ORAL | Status: DC
  Administered 2016-01-02 – 2016-01-03 (×2): 12.5 mg via ORAL
  Filled 2016-01-02 (×4): qty 1

## 2016-01-02 MED ORDER — MORPHINE SULFATE (PF) 2 MG/ML IV SOLN: 2.0000 mg | INTRAVENOUS | Status: DC | PRN

## 2016-01-02 MED ORDER — SODIUM CHLORIDE 0.9 % IV SOLN
INTRAVENOUS | Status: DC
  Administered 2016-01-02 – 2016-01-03 (×4): via INTRAVENOUS

## 2016-01-02 NOTE — Progress Notes (Signed)
Patient admitted from ED shaking. Temperature 102.6. Oncall Dr. Avon Gully paged . Order to give tylenol. Nurse suggested to test patient for flu. Droplet precaution initiated. Patient denies any pain. Nurse will continue to monitor.

## 2016-01-02 NOTE — Consult Note (Signed)
CARDIOLOGY CONSULT NOTE   Patient ID: LATRAVIOUS SUBASIC MRN: [id number], DOB/AGE: [birth year]   Admit date: 01/01/2016 Date of Consult: 01/02/2016   Primary Physician: Clearance Coots, MD Primary Cardiologist: Dr. Burt Knack / Dr. Radford Pax ([facility])  Pt. Profile  morbidly obese 60 year old male with past medical history of CAD, HLD, PAF, OSA, immune mediated thrombocytopenia and history of fatty liver disease presented with chest pain  Problem List  Past Medical History  Diagnosis Date  . GERD (gastroesophageal reflux disease)   . Psoriasis   . Frequent headaches   . Facial cellulitis 2014    Periorbital  . Coronary artery disease     a. Evaluated by Dr. Stanford Breed 2012 - nonobstructive 2007-2008 by cath. b. Abnormal stress test 10/2014 but cath deferred temporarily due to thrombocytopenia. c. 10/2014: readm with AF-RVR/NSTEMI - s/p BMS to mLCx 11/04/14 (mod dz in RCA);  d. relook cath 01/2015 and 06/02/2015 patent stent, nonobs CAD, EF 55-65%.  . Back pain, lumbosacral 2014  . Thrombocytopenia ([place])     a. Onset unclear, but noted 2012. ? 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 s/p prednisone, IVIG.  Marland Kitchen Hyperlipidemia   . Obesity   . Elevated LFTs   . Fatty liver US - 2012  . PAF (paroxysmal atrial fibrillation) ([place])     a. Episode 10/2014 at time of NSTEMI, spont converted to NSR. Observation for further episodes (not on anticoag at present time due to [facility] = 1, thrombocytopenia).  . Sinus bradycardia   . OSA (obstructive sleep apnea)     severe with AHI 56/hr with successful CPAP titration to 18cm H2O  . Morbid obesity [facility])     Past Surgical History  Procedure Laterality Date  . Tonsillectomy  1960's  . Cholecystectomy  2007  . Knee arthroplasty  1970's  . Facial cosmetic surgery  2014  . Cardiac catheterization    . Left heart catheterization with coronary angiogram N/A 11/04/2014    Procedure: LEFT HEART CATHETERIZATION WITH CORONARY ANGIOGRAM;   Surgeon: Jettie Booze, MD;  Location: [facility] CATH LAB;  Service: Cardiovascular;  Laterality: N/A;  . Left heart catheterization with coronary angiogram N/A 01/28/2015    Procedure: LEFT HEART CATHETERIZATION WITH CORONARY ANGIOGRAM;  Surgeon: Sherren Mocha, MD;  Location: [facility] CATH LAB;  Service: Cardiovascular;  Laterality: N/A;  . Cardiac catheterization N/A 06/02/2015    Procedure: Left Heart Cath and Coronary Angiography;  Surgeon: Jettie Booze, MD;  Location: [place] CV LAB;  Service: Cardiovascular;  Laterality: N/A;     Allergies  No Known Allergies  HPI   The patient is a morbidly obese 60 year old male with past medical history of CAD, HLD, PAF, OSA, immune mediated thrombocytopenia and history of fatty liver disease. He had NSTEMI in December 2015, however cath was delayed due to the significant thrombocytopenia. Hematology/oncology was consulted and patient was evaluated by Dr. Heath Lark. He eventually underwent cardiac catheterization on 11/04/2014 which showed 99% stenosis in mid left circumflex artery treated with 2.25 x 12 mm vision bare metal stent postdilated to 2.8 mm. He had a mildly abnormal stress test in March 2016 which showed infarct with peri-infarct ischemia. Therefore he was taken back to the cath lab on 01/28/2015 which showed mild nonobstructive CAD with 40-50% stenosis of RCA, minor LAD stenosis, continued patency of stented segment of the left circumflex with mild in-stent restenosis. He presented back with chest pain in July 2016, he underwent relook cath on 06/02/2015 which  showed 25% mild in-stent restenosis in mid left circumflex artery. He did have atrial fibrillation with RVR in December 2015, however given the lack of recurrence and the fact that it occurred in the setting of ischemia, he was not placed on systemic anticoagulation therapy. And given his chronic thrombocytopenia, he was felt to be a poor candidate for systemic anticoagulation on top of  DAPT. Since he has not had any significant bleeding issue, he was kept on aspirin and Plavix.  Yesterday morning, he woke up having some cramping sensation over the left splenic region. Eventually, he started having chest pressure and also significant shortness of breath. His wife gave him 4 doses of baby aspirin and 2 dose of nitroglycerin. EMS later gave him 2 more doses of nitroglycerin before the chest pain went away. The total duration of the chest discomfort lasted about half hour. He did have recurrence after admission to the hospital. His wife also noted his face was very flushed and he did have fever or chill on arrival. Tmax > 102. White blood cell count was normal, serial troponin was negative. Platelet 40. Chest x-ray showed mild congestive changes. Given his history of coronary artery disease, cardiology has been consulted for chest pain.    Inpatient Medications  . aspirin  81 mg Oral Daily  . buPROPion  150 mg Oral Daily  . clopidogrel  75 mg Oral Daily  . furosemide  20 mg Oral Daily  . isosorbide mononitrate  30 mg Oral Daily  . metoprolol succinate  12.5 mg Oral Daily    Family History Family History  Problem Relation Age of Onset  . Arthritis Maternal Grandmother   . Heart disease Sister   . Diabetes Sister   . Cancer - Ovarian Sister   . Heart disease      Maternal/paternal grandparents  . Cirrhosis Mother   . Heart attack Mother   . Heart attack Maternal Aunt   . Hypertension Mother   . Hypertension Father   . Stroke Father      Social History Social History   Social History  . Marital Status: Married    Spouse Name: N/A  . Number of Children: N/A  . Years of Education: N/A   Occupational History  . Not on file.   Social History Main Topics  . Smoking status: Current Some Day Smoker -- 0.20 packs/day for 43 years    Types: Cigarettes  . Smokeless tobacco: Never Used     Comment: 11/28/2012 "chew occasionally"  Quit for 2.5 months in early 2016  .  Alcohol Use: 0.0 oz/week    0 Standard drinks or equivalent per week     Comment: 11/28/2012 "beer q 6 months or so"  . Drug Use: No  . Sexual Activity: No   Other Topics Concern  . Not on file   Social History Narrative   Lives in [place].    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      Review of Systems  General:  No night sweats or weight changes.  +chills, fever Cardiovascular:  No dyspnea on exertion, edema, orthopnea, palpitations, paroxysmal nocturnal dyspnea. +chest pain, SOB Dermatological: No rash, lesions/masses Respiratory: Occasional cough with yellow productive phlegm Urologic: No hematuria, dysuria Abdominal:   No nausea, vomiting, diarrhea, bright red blood per rectum, melena, or hematemesis Neurologic:  No visual changes, wkns, changes in mental status.  All other systems reviewed and are otherwise negative except as noted above.  Physical Exam  Blood pressure 106/61, pulse 77, temperature 98.8 F (37.1 C), temperature source Oral, resp. rate 19, height 6\' 3"  (1.905 m), weight 278 lb 8 oz (126.327 kg), SpO2 97 %.  General: Pleasant, NAD Psych: Normal affect. Neuro: Alert and oriented X 3. Moves all extremities spontaneously. HEENT: Normal  Neck: Supple without bruits or JVD. Lungs:  Resp regular and unlabored, CTA. Heart: RRR no s3, s4, or murmurs. Abdomen: Soft, non-tender, non-distended, BS + x 4.  Extremities: No clubbing, cyanosis or edema. DP/PT/Radials 2+ and equal bilaterally.  Labs   Recent Labs  01/02/16 0134 01/02/16 0333 01/02/16 1212  TROPONINI 0.03 <0.03 <0.03   Lab Results  Component Value Date   WBC 4.8 01/02/2016   HGB 11.3* 01/02/2016   HCT 33.7* 01/02/2016   MCV 91.8 01/02/2016   PLT 36* 01/02/2016    Recent Labs Lab 01/02/16 1212  NA 137  K 3.8  CL 106  CO2 24  BUN 8  CREATININE 0.89  CALCIUM 8.3*  PROT 6.0*  BILITOT 3.2*  ALKPHOS 64    ALT 55  AST 68*  GLUCOSE 107*   Lab Results  Component Value Date   CHOL 157 06/02/2015   HDL 27* 06/02/2015   LDLCALC 115* 06/02/2015   TRIG 75 06/02/2015   Lab Results  Component Value Date   DDIMER 0.86* 11/18/2015    Radiology/Studies  Dg Chest 2 View  01/01/2016  CLINICAL DATA:  60 year old male with chest pain and shortness of breath EXAM: CHEST  2 VIEW COMPARISON:  Radiograph dated 06/01/2015 FINDINGS: Two views of the chest demonstrate mild increased interstitial prominence in the mid to lower lung field may represent a degree of vascular congestion. There is no focal consolidation, pleural effusion, or pneumothorax. The cardiac silhouette is within normal limits. No acute osseous pathology. IMPRESSION: Probable mild congestive changes.  No focal consolidation. Electronically Signed   By: Anner Crete M.D.   On: 01/01/2016 22:50    ECG  Normal sinus rhythm without significant ST-T wave changes.  ASSESSMENT AND PLAN  1. Chest pain  - per patient, it feels somewhat different from previous MI, he has been having relook cath every half a year. Previous cath in March and July 2016 were both negative for significant CAD and in-stent restenosis.  - He has been continued on aspirin and Plavix, at this time, the chest is the restenosis is very low.  - Would not pursue cardiac catheterization unless have definitive sign include EKG changes or troponin elevation. Discussed with Dr. Sallyanne Kuster, will hold off on ordering myoview either. Note, he did have a myoview that showed periinfarct ischemia in 01/2015 however subsequent cath was negative for significant blockage.  2. CAD s/p BMS to mid LCx 10/2014  3. PAF not on systemic anticoagulation  - CHA2DS2-Vasc score 1 (CAD), risk of bleeding high given chronic thrombocytopenia  4. Chronic thrombocytopenia followed by Dr. Alvy Bimler Ni, level stable.   5. Obstructive sleep apnea 6. Hyperlipidemia   Signed, Almyra Deforest,  PA-C 01/02/2016, 4:58 PM  I have seen and examined the patient along with Almyra Deforest, PA.  I have reviewed the chart, notes and new data.  I agree with PA/NP's note.  Key new complaints: his symptoms are compatible with angina pectoris triggered by tachycardia (probably sinus tachycardia) and fever Key examination changes: no overt CHF or significant arrhythmia Key new findings / data: low risk findings  on ECG and biomarkers. Persistent moderate thrombocytopenia  PLAN: His symptoms were triggered by the physiological hemodynamic response to a febrile/infectious illness.  Low level of suspicion for true atherothrombotic acute coronary syndrome. Plan for invasive coronary evaluation. Treatment should focus on treatment of the triggering infectious process, although the exact source of this is not clear at this time. In the past he has had repeated episodes of cellulitis of his lower extremities  Sanda Klein, MD, [place] [phone] 01/02/2016, 6:25 PM

## 2016-01-02 NOTE — Progress Notes (Signed)
Family Medicine Teaching Service Daily Progress Note Intern Pager: [phone]  Patient name: Vincent Ochoa Medical record number: [id number] Date of birth: Aug 03, 1956 Age: 60 y.o. Gender: male  Primary Care Provider: Clearance Coots, MD Consultants: cardiology Code Status: FULL  Pt Overview and Major Events to Date:  2/11 - admitted to FPTS  Assessment and Plan: Vincent Ochoa is a 60 y.o. male presenting with chest pain. PMH is significant for NSTEMI (2015) and CAD with stent placement (2015).   Chest pain: HEART score 5. Wells score for PE is 1.5 (tachycardia), making this less likely diagnosis. Concern for worsening underlying coronary ischemia vs angina. Pain improved with nitro. Troponin negative and EKG with no new ischemic changes in ED, however given history of NSTEMI and stent placement, admitted for continued monitoring and cardiology consultation. Repeat troponin negative. - Cardiology consult, appreciate recommendations - AM EKG - Cycle troponins - Cont home ASA and Plavix - Nitro, morphine PRN  CAD: s/p NSTEMI with stent placement in 2015. Last cath in March 2016 with mild in-stent restenosis and 50% RCA lesion. Followed by Dr. Burt Knack outpatient.  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 Followed by Dr. Heath Lark ([place]). Per progress note on 09/15/15, okay to continue dual antiplatelet treatment as long as platelet count >30K. - Continue home ASA and Plavix  - Daily CBC - F/u with hemonc as scheduled in 02/2016  Depression: - Continue home bupropion  FEN/GI: NPO, NS@100  mL/hr Prophylaxis: home ASA, Plavix  Disposition: home pending medical improvement  Subjective:  Patient reports he has no chest pain this AM and says he feels much better. He thinks now that his chest pain may have been due in part to stress, as he has had increased stressors at home recently. He denies  difficulty breathing. Endorses gas but says this is his on ly complaint this morning. Last BM was yesterday.  Patient does endorse some tenderness to palpation of his legs, however he says this is constant and is not a new problem. He denies personal or family history of clot; denies recent immobilization, including car trips or plane rides.  Patient has been febrile overnight, but temperature has responded to Tylenol.   Objective: Temp:  [99.1 F (37.3 C)-102.6 F (39.2 C)] 100.6 F (38.1 C) (02/11 0328) Pulse Rate:  [84-102] 102 (02/11 0328) Resp:  [18-24] 20 (02/11 0328) BP: (103-142)/(36-85) 103/36 mmHg (02/11 0328) SpO2:  [94 %-98 %] 94 % (02/11 0328) Weight:  [278 lb 8 oz (126.327 kg)] 278 lb 8 oz (126.327 kg) (02/11 0200) Physical Exam: General: sleeping in bed comfortably but easily able to arouse, in NAD Cardiovascular: intermittently tachycardic, distant heart sounds, regular rhythm Respiratory: CTAB, normal work of breathing with sats in high 90s on room air, no wheezing or rhonchi Abdomen: obese, soft, non-tender, non-distended, +BS Extremities: LE non-edematous but diffusely tender on both calf and shin bilaterally, no cords palpated  Laboratory:  Recent Labs Lab 01/01/16 2211  WBC 4.5  HGB 12.3*  HCT 35.0*  PLT 40*    Recent Labs Lab 01/01/16 2211  NA 138  K 4.0  CL 105  CO2 23  BUN 6  CREATININE 0.77  CALCIUM 8.7*  GLUCOSE 110*    Imaging/Diagnostic Tests: Dg Chest 2 View  01/01/2016  CLINICAL DATA:  60 year old male with chest pain and shortness of breath EXAM: CHEST  2 VIEW COMPARISON:  Radiograph dated 06/01/2015 FINDINGS: Two views of  the chest demonstrate mild increased interstitial prominence in the mid to lower lung field may represent a degree of vascular congestion. There is no focal consolidation, pleural effusion, or pneumothorax. The cardiac silhouette is within normal limits. No acute osseous pathology. IMPRESSION: Probable mild congestive  changes.  No focal consolidation. Electronically Signed   By: Anner Crete M.D.   On: 01/01/2016 22:50    Verner Mould, MD 01/02/2016, 6:34 AM PGY-1, [place] Intern pager: [phone], text pages welcome

## 2016-01-02 NOTE — H&P (Signed)
[facility] Admission History and Physical Service Pager: [phone]  Patient name: Vincent Ochoa Medical record number: [id number] Date of birth: Feb 04, 1956 Age: 60 y.o. Gender: male  Primary Care Provider: Clearance Coots, MD Consultants: Cardiology Code Status: FULL (Per discussion with patient in ED, would like to reconsider his prior DNR status. Will remain full code in the meantime.)  Chief Complaint: chest pain  Assessment and Plan: Vincent Ochoa is a 60 y.o. male presenting with chest pain. PMH is significant for NSTEMI (2015) and CAD with stent placement (2015).   Chest pain: HEART score 5. Concern for worsening underlying coronary ischemia vs angina. Pain improved with nitro. Troponin negative and EKG with no new ischemic changes in ED, however given history of NSTEMI and stent placement, admitted for continued monitoring and cardiology consultation.  - Place in observation with telemetry, attending Dr. Gwendlyn Deutscher - Cardiology consult in AM, appreciate recommendations - AM EKG - Cycle troponins - Cont home ASA and Plavix - Nitro, morphine PRN  CAD: s/p NSTEMI with stent placement in 2015. Last cath in March 2016 with mild in-stent restenosis and 50% RCA lesion. Followed by Dr. Burt Knack outpatient.   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 Followed by Dr. Heath Lark ([place]). Per progress note on 09/15/15, okay to continue dual antiplatelet treatment as long as platelet count >30K. - Continue home ASA and Plavix  - Daily CBC - F/u with hemonc as scheduled in 02/2016  Depression: - Continue home bupropion  FEN/GI: NPO, NS@100  mL/hr Prophylaxis: home ASA, Plavix  Disposition: place in observation for further monitoring   History of Present Illness:  Vincent Ochoa is a 60 y.o. male presenting with chest pain.   Patient reports earlier this evening, he was  sitting down, when he began to experience L-sided chest pressure. He reported that it felt like someone was sitting on his chest. He also had associated pain radiating down his L arm and numbness in his L fingers, as well as SOB. He also reported a cramping sensation near his ribs on the L. He took two nitroglycerin tablets as well as four aspirin 81 mg tablets at home with mild relief, then presented to the ED. He reports a similar episode in 2015 when he had an NSTEMI.   He also reports that with onset of chest pain, he developed diaphoresis and chills. He denies fevers or any other viral symptoms, but says he feels febrile and is very cold. Also endorses swelling of LLE, however says he often has cellulitis in this L, so that is not unusual.   Review Of Systems: Per HPI with the following additions: denies coughing, fevers, chills, nausea, vomiting, diarrhea, constipation, abdominal pain Otherwise the remainder of the systems were negative.  Patient Active Problem List   Diagnosis Date Noted  . Chest pain 01/02/2016  . Diastolic heart failure ([place]) 10/07/2015  . Cellulitis of leg, left 10/07/2015  . Leukopenia 09/15/2015  . Acrochordon 07/18/2015  . Coronary artery disease   . PAF (paroxysmal atrial fibrillation) ([place])   . Morbid obesity ([place])   . Leg swelling 04/23/2015  . Transaminitis 04/23/2015  . Knee pain, bilateral 03/05/2015  . Depression 01/08/2015  . OSA (obstructive sleep apnea)   . Tobacco abuse 12/08/2014  . Physical deconditioning 12/08/2014  . Paroxysmal atrial fibrillation ([place]) 11/05/2014  . Hyperlipidemia LDL goal <70 11/05/2014  . Obesity 11/05/2014  . NSTEMI (non-ST  elevated myocardial infarction) ([place])   . Back pain, lumbosacral 01/04/2013  . Thrombocytopenia ([place]) 11/29/2012  . Decreased libido 08/15/2011  . GERD (gastroesophageal reflux disease) 08/15/2011  . CAD (coronary artery disease) 07/29/2011  . Psoriasis 07/29/2011    Past Medical History: Past  Medical History  Diagnosis Date  . GERD (gastroesophageal reflux disease)   . Psoriasis   . Frequent headaches   . Facial cellulitis 2014    Periorbital  . Coronary artery disease     a. Evaluated by Dr. Stanford Breed 2012 - nonobstructive 2007-2008 by cath. b. Abnormal stress test 10/2014 but cath deferred temporarily due to thrombocytopenia. c. 10/2014: readm with AF-RVR/NSTEMI - s/p BMS to mLCx 11/04/14 (mod dz in RCA);  d. relook cath 01/2015 and 06/02/2015 patent stent, nonobs CAD, EF 55-65%.  . Back pain, lumbosacral 2014  . Thrombocytopenia ([place])     a. Onset unclear, but noted 2012. ? 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 s/p prednisone, IVIG.  Marland Kitchen Hyperlipidemia   . Obesity   . Elevated LFTs   . Fatty liver US - 2012  . PAF (paroxysmal atrial fibrillation) ([place])     a. Episode 10/2014 at time of NSTEMI, spont converted to NSR. Observation for further episodes (not on anticoag at present time due to [facility] = 1, thrombocytopenia).  . Sinus bradycardia   . OSA (obstructive sleep apnea)     severe with AHI 56/hr with successful CPAP titration to 18cm H2O  . Morbid obesity ([place])     Past Surgical History: Past Surgical History  Procedure Laterality Date  . Tonsillectomy  1960's  . Cholecystectomy  2007  . Knee arthroplasty  1970's  . Facial cosmetic surgery  2014  . Cardiac catheterization    . Left heart catheterization with coronary angiogram N/A 11/04/2014    Procedure: LEFT HEART CATHETERIZATION WITH CORONARY ANGIOGRAM;  Surgeon: Jettie Booze, MD;  Location[facility] C[facility] LAB;  Service: Cardiovascular;  Laterality: N/A;  . Left heart catheterization with coronary angiogram N/A 01/28/2015    Procedure: LEFT HEART CATHETERIZATION WITH CORONARY ANGIOGRAM;  Surgeon: Sherren Mocha, MD;  Location: [facility] CATH LAB;  Service: Cardiovascular;  Laterality: N/A;  . Cardiac catheterization N/A 06/02/2015    Procedure: Left Heart Cath and Coronary Angiography;   Surgeon: Jettie Booze, MD;  Location: [place] CV LAB;  Service: Cardiovascular;  Laterality: N/A;    Social History: Social History  Substance Use Topics  . Smoking status: Current Some Day Smoker -- 0.20 packs/day for 43 years    Types: Cigarettes  . Smokeless tobacco: Never Used     Comment: 11/28/2012 "chew occasionally"  Quit for 2.5 months in early 2016  . Alcohol Use: 0.0 oz/week    0 Standard drinks or equivalent per week     Comment: 11/28/2012 "beer q 6 months or so"   Additional social history: currently smoking a little less than a pack of cigarettes per day.   Please also refer to relevant sections of EMR.  Family History: Family History  Problem Relation Age of Onset  . Arthritis Maternal Grandmother   . Heart disease Sister   . Diabetes Sister   . Cancer - Ovarian Sister   . Heart disease      Maternal/paternal grandparents  . Cirrhosis Mother   . Heart attack Mother   . Heart attack Maternal Aunt   . Hypertension Mother   . Hypertension Father   . Stroke Father  Allergies and Medications: No Known Allergies No current facility-administered medications on file prior to encounter.   Current Outpatient Prescriptions on File Prior to Encounter  Medication Sig Dispense Refill  . aspirin 81 MG tablet Take 1 tablet (81 mg total) by mouth daily.    Marland Kitchen buPROPion (WELLBUTRIN XL) 150 MG 24 hr tablet Take 150 mg by mouth daily.    . clopidogrel (PLAVIX) 75 MG tablet Take 1 tablet (75 mg total) by mouth daily. 30 tablet 7  . isosorbide mononitrate (IMDUR) 30 MG 24 hr tablet Take 1 tablet (30 mg total) by mouth daily. 30 tablet 11  . metoprolol succinate (TOPROL-XL) 25 MG 24 hr tablet Take 0.5 tablets (12.5 mg total) by mouth daily. 30 tablet 11  . nitroGLYCERIN (NITROSTAT) 0.4 MG SL tablet Place 1 tablet (0.4 mg total) under the tongue every 5 (five) minutes as needed for chest pain (up to 3 doses). 25 tablet 3  . atorvastatin (LIPITOR) 10 MG tablet Take 1  tablet (10 mg total) by mouth every evening. (Patient not taking: Reported on 01/01/2016) 90 tablet 3  . clindamycin (CLEOCIN) 300 MG capsule Take 1 capsule (300 mg total) by mouth 4 (four) times daily. (Patient not taking: Reported on 01/01/2016) 28 capsule 0  . cyclobenzaprine (FLEXERIL) 10 MG tablet Take 1 tablet (10 mg total) by mouth 3 (three) times daily as needed for muscle spasms. (Patient not taking: Reported on 01/01/2016) 90 tablet 1  . doxycycline (VIBRA-TABS) 100 MG tablet Take 1 tablet (100 mg total) by mouth 2 (two) times daily. (Patient not taking: Reported on 01/01/2016) 20 tablet 0  . HYDROcodone-acetaminophen (NORCO/VICODIN) 5-325 MG tablet Take 1 tablet by mouth every 6 (six) hours as needed for moderate pain or severe pain. (Patient not taking: Reported on 01/01/2016) 10 tablet 0  . naproxen (NAPROSYN) 500 MG tablet Take 1 tablet (500 mg total) by mouth 2 (two) times daily with a meal. (Patient not taking: Reported on 01/01/2016) 14 tablet 0  . pantoprazole (PROTONIX) 40 MG tablet Take 1 tablet (40 mg total) by mouth daily. (Patient not taking: Reported on 01/01/2016) 30 tablet 1  . sulfamethoxazole-trimethoprim (BACTRIM DS,SEPTRA DS) 800-160 MG tablet Take 1 tablet by mouth 2 (two) times daily. For a 5 day course (Patient not taking: Reported on 01/01/2016) 10 tablet 0    Objective: BP 140/70 mmHg  Pulse 86  Temp(Src) 99.1 F (37.3 C) (Oral)  Resp 24  SpO2 97% Exam: General: lying in bed under multiple blankets in mild distress; diaphoretic but no rigors Eyes: PERRLA ENTM: slightly dry oral mucosa, no oropharyngeal erythema or exudate Neck: FROM, supple Cardiovascular: distant heart sounds likely 2/2 body habitus; regular rate, no murmurs appreciated Respiratory: CTAB, able to speak in full sentences, no wheezes or rhonchi on auscultation Abdomen: obese, non-tender, non-distended, soft, +BS MSK: moving all extremities spontaneously; no LE edema or tenderness Skin: Warm,  diaphoretic; healing psoriatic lesions on L leg Neuro: A&Ox3, no focal deficits Psych: Appropriate mood and affect   Labs and Imaging: CBC BMET   Recent Labs Lab 01/01/16 2211  WBC 4.5  HGB 12.3*  HCT 35.0*  PLT 40*    Recent Labs Lab 01/01/16 2211  NA 138  K 4.0  CL 105  CO2 23  BUN 6  CREATININE 0.77  GLUCOSE 110*  CALCIUM 8.7*     Dg Chest 2 View  01/01/2016  CLINICAL DATA:  60 year old male with chest pain and shortness of breath EXAM: CHEST  2 VIEW  COMPARISON:  Radiograph dated 06/01/2015 FINDINGS: Two views of the chest demonstrate mild increased interstitial prominence in the mid to lower lung field may represent a degree of vascular congestion. There is no focal consolidation, pleural effusion, or pneumothorax. The cardiac silhouette is within normal limits. No acute osseous pathology. IMPRESSION: Probable mild congestive changes.  No focal consolidation. Electronically Signed   By: Anner Crete M.D.   On: 01/01/2016 22:50    Verner Mould, MD 01/02/2016, 1:18 AM PGY-1, [place] Intern pager: [phone], text pages welcome

## 2016-01-02 NOTE — Plan of Care (Signed)
Problem: Pain Managment: Goal: General experience of comfort will improve Outcome: Progressing Denies pain at this time   

## 2016-01-03 DIAGNOSIS — R079 Chest pain, unspecified: Secondary | ICD-10-CM | POA: Diagnosis not present

## 2016-01-03 DIAGNOSIS — I25118 Atherosclerotic heart disease of native coronary artery with other forms of angina pectoris: Secondary | ICD-10-CM | POA: Diagnosis not present

## 2016-01-03 LAB — CBC
HCT: 33.5 % — ABNORMAL LOW (ref 39.0–52.0)
Hemoglobin: 11.2 g/dL — ABNORMAL LOW (ref 13.0–17.0)
MCH: 30.9 pg (ref 26.0–34.0)
MCHC: 33.4 g/dL (ref 30.0–36.0)
MCV: 92.3 fL (ref 78.0–100.0)
PLATELETS: 36 10*3/uL — AB (ref 150–400)
RBC: 3.63 MIL/uL — ABNORMAL LOW (ref 4.22–5.81)
RDW: 14.6 % (ref 11.5–15.5)
WBC: 3.6 10*3/uL — ABNORMAL LOW (ref 4.0–10.5)

## 2016-01-03 NOTE — Progress Notes (Signed)
Reviewed discharge instructions with patient. Removed IV. Escorted out via wheelchair.  Tymar Polyak, Mervin Kung RN

## 2016-01-03 NOTE — Progress Notes (Signed)
Patient set up on CPAP and places himself on when ready.

## 2016-01-03 NOTE — Discharge Instructions (Signed)
We admitted you because of your chest pain to make sure there was nothing going on with your heart. Cardiology saw you and felt there was a low likelihood of a heart attack related problem. Please follow-up with your primary care physician.

## 2016-01-03 NOTE — Progress Notes (Signed)
Family Medicine Teaching Service Daily Progress Note Intern Pager: [phone]  Patient name: Vincent Ochoa Medical record number: [id number] Date of birth: 06-27-1956 Age: 60 y.o. Gender: male  Primary Care Provider: Clearance Coots, MD Consultants: cardiology Code Status: FULL  Pt Overview and Major Events to Date:  2/11 - admitted to FPTS  Assessment and Plan: SABASTAIN Ochoa is a 60 y.o. male presenting with chest pain. PMH is significant for NSTEMI (2015) and CAD with stent placement (2015).   Chest pain: HEART score 5. Wells score for PE is 1.5 (tachycardia), making this less likely diagnosis. Chest  Pain resolved - Cardiology following, appreciate recommendations - Cont home ASA and Plavix - Nitro, morphine PRN  Thromobocytopenia: Plts 36 2/11 from 40 on admission - F/u CBC this AM   CAD: s/p NSTEMI with stent placement in 2015. Last cath in March 2016 with mild in-stent restenosis and 50% RCA lesion. Followed by Dr. Burt Knack outpatient.  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 Followed by Dr. Heath Lark ([place]). Per progress note on 09/15/15, okay to continue dual antiplatelet treatment as long as platelet count >30K. - Continue home ASA and Plavix  - Daily CBC - F/u with hemonc as scheduled in 02/2016  Lower Leg Pain - Diffuse lower leg pain, worse on right than left, however exam with no swelling, warmth or  Elevated WBC makes infection less likely. Less likely DVT given lack of swelling. Pt does have chronic back lower leg pain  Depression: - Continue home bupropion  FEN/GI: heart healthy diet, D/c NS@100  mL/hr Prophylaxis: home ASA, Plavix  Disposition: home pending medical improvement  Subjective:  Denies chest pain, SOB. Complains of left right leg pain that started approx 1 week ago  Objective: Temp:  [97.3 F (36.3 C)-98.9 F (37.2 C)] 97.3 F (36.3 C) (02/12  0435) Pulse Rate:  [71-83] 71 (02/12 0435) Resp:  [18-19] 18 (02/12 0435) BP: (103-117)/(49-61) 103/49 mmHg (02/12 0435) SpO2:  [97 %-98 %] 97 % (02/12 0435) Physical Exam: General: sleeping in bed comfortably but easily able to arouse, in NAD Cardiovascular: intermittently tachycardic, distant heart sounds, regular rhythm Respiratory: CTAB, normal work of breathing with sats in high 90s on room air, no wheezing or rhonchi Abdomen: obese, soft, non-tender, non-distended, +BS Extremities: LE non-edematous but diffusely tender on both calf and shin bilaterally, more on right, no cords palpated  Laboratory:  Recent Labs Lab 01/01/16 2211 01/02/16 1212  WBC 4.5 4.8  HGB 12.3* 11.3*  HCT 35.0* 33.7*  PLT 40* 36*    Recent Labs Lab 01/01/16 2211 01/02/16 1212  NA 138 137  K 4.0 3.8  CL 105 106  CO2 23 24  BUN 6 8  CREATININE 0.77 0.89  CALCIUM 8.7* 8.3*  PROT  --  6.0*  BILITOT  --  3.2*  ALKPHOS  --  64  ALT  --  55  AST  --  68*  GLUCOSE 110* 107*    Imaging/Diagnostic Tests: Dg Chest 2 View  01/01/2016  CLINICAL DATA:  60 year old male with chest pain and shortness of breath EXAM: CHEST  2 VIEW COMPARISON:  Radiograph dated 06/01/2015 FINDINGS: Two views of the chest demonstrate mild increased interstitial prominence in the mid to lower lung field may represent a degree of vascular congestion. There is no focal consolidation, pleural effusion, or pneumothorax. The cardiac silhouette is within normal limits. No acute osseous pathology. IMPRESSION: Probable mild congestive  changes.  No focal consolidation. Electronically Signed   By: Anner Crete M.D.   On: 01/01/2016 22:50    [name], MD 01/03/2016, 9:18 AM PGY-2 [place] Intern pager: [phone], text pages welcome

## 2016-01-03 NOTE — Progress Notes (Signed)
Patient Name: Vincent Ochoa Date of Encounter: 01/03/2016   SUBJECTIVE  Feeling well. No chest pain, sob or palpitations.   CURRENT MEDS . aspirin  81 mg Oral Daily  . buPROPion  150 mg Oral Daily  . clopidogrel  75 mg Oral Daily  . furosemide  20 mg Oral Daily  . isosorbide mononitrate  30 mg Oral Daily  . metoprolol succinate  12.5 mg Oral Daily    OBJECTIVE  Filed Vitals:   01/02/16 1410 01/02/16 1553 01/02/16 1952 01/03/16 0435  BP: 106/61  117/59 103/49  Pulse: 77  83 71  Temp: 98.2 F (36.8 C) 98.8 F (37.1 C) 98.9 F (37.2 C) 97.3 F (36.3 C)  TempSrc: Oral Oral Oral Axillary  Resp: 19  18 18   Height:      Weight:      SpO2: 97%  98% 97%    Intake/Output Summary (Last 24 hours) at 01/03/16 1124 Last data filed at 01/03/16 0830  Gross per 24 hour  Intake    580 ml  Output   3650 ml  Net  -3070 ml   Filed Weights   01/02/16 0200  Weight: 278 lb 8 oz (126.327 kg)    PHYSICAL EXAM  General: Pleasant, NAD. Neuro: Alert and oriented X 3. Moves all extremities spontaneously. Psych: Normal affect. HEENT:  Normal  Neck: Supple without bruits or JVD. Lungs:  Resp regular and unlabored, CTA. Heart: RRR no s3, s4, or murmurs. Abdomen: Soft, non-tender, non-distended, BS + x 4.  Extremities: No clubbing, cyanosis. 1+ BL LE edema with erythema and TTP.  DP/PT/Radials 2+ and equal bilaterally.  Accessory Clinical Findings  CBC  Recent Labs  01/02/16 1212 01/03/16 1011  WBC 4.8 3.6*  HGB 11.3* 11.2*  HCT 33.7* 33.5*  MCV 91.8 92.3  PLT 36* 36*   Basic Metabolic Panel  Recent Labs  01/01/16 2211 01/02/16 1212  NA 138 137  K 4.0 3.8  CL 105 106  CO2 23 24  GLUCOSE 110* 107*  BUN 6 8  CREATININE 0.77 0.89  CALCIUM 8.7* 8.3*   Liver Function Tests  Recent Labs  01/02/16 1212  AST 68*  ALT 55  ALKPHOS 64  BILITOT 3.2*  PROT 6.0*  ALBUMIN 2.8*   No results for input(s): LIPASE, AMYLASE in the last 72 hours. Cardiac  Enzymes  Recent Labs  01/02/16 0134 01/02/16 0333 01/02/16 1212  TROPONINI 0.03 <0.03 <0.03    TELE  NSR  Radiology/Studies  Dg Chest 2 View  01/01/2016  CLINICAL DATA:  60 year old male with chest pain and shortness of breath EXAM: CHEST  2 VIEW COMPARISON:  Radiograph dated 06/01/2015 FINDINGS: Two views of the chest demonstrate mild increased interstitial prominence in the mid to lower lung field may represent a degree of vascular congestion. There is no focal consolidation, pleural effusion, or pneumothorax. The cardiac silhouette is within normal limits. No acute osseous pathology. IMPRESSION: Probable mild congestive changes.  No focal consolidation. Electronically Signed   By: Anner Crete M.D.   On: 01/01/2016 22:50    ASSESSMENT AND PLAN   1. Chest pain - Troponin negative. EKG without acute changes. No further chest pain.  He has been continued on aspirin and Plavix, at this time, the chest is the restenosis is very low. Has multiple negative cath.  - Note, he did have a myoview that showed periinfarct ischemia in 01/2015 however subsequent cath was negative for significant blockage. - Continue ASA, Plavix, imdur and  BB. Consider increasing imdur if ongoing pain as outpatient. MD to see for final recommendation.   2. CAD s/p BMS to mid LCx 10/2014  3. PAF not on systemic anticoagulation - CHA2DS2-Vasc score 1 (CAD), risk of bleeding high given chronic thrombocytopenia  4. Chronic thrombocytopenia followed by Dr. Alvy Bimler Ni, level stable.   5. Obstructive sleep apnea on CPAP 6. Hyperlipidemia  7. Chronic LE cellulitis - The patient has been treated with Abx multiple times, however worsen when off abx. Has 1+ Edema, erythema and minimally tenderness to palpation. ?chronic venous statis.   8. HL - 06/02/2015: Cholesterol 157; HDL 27*; LDL Cholesterol 115*; Triglycerides 75; VLDL 15  - Was on lipitor 10mg  when seen by Dr. Radford Pax 09/30/15. Now off due myalgia.  Still complaining of diffuse muscular pain, start  low intensity statin  once resolved.   Signed, Bhagat,Bhavinkumar PA-C Pager [phone]  I have seen and examined the patient along with Bhagat,Bhavinkumar PA-C.  I have reviewed the chart, notes and new data.  I agree with PA/NP's note.  Key new complaints: no further angina Key new findings / data: low risk ECG and biomarkers.  PLAN: His chest pain was related to tachycardia, in turn related to fever (now afebrile). No plan for inpatient stress test or cardiac cath. The underlying cause of his fever needs to be identified and treated. No pain for changing antianginal meds  Sanda Klein, MD, [facility] [phone] 01/03/2016, 12:16 PM

## 2016-01-04 NOTE — Discharge Summary (Signed)
[facility] Discharge Summary  Patient name: Vincent Ochoa Medical record number: [id number] Date of birth: 1956/06/22 Age: 60 y.o. Gender: male Date of Admission: 01/01/2016  Date of Discharge: 01/03/2016 Admitting Physician: Kinnie Feil, MD  Primary Care Provider: Clearance Coots, MD Consultants: cardiology  Indication for Hospitalization: chest pain  Discharge Diagnoses/Problem List:  Patient Active Problem List   Diagnosis Date Noted  . Chest pain 01/02/2016  . Diastolic heart failure ([place]) 10/07/2015  . Cellulitis of leg, left 10/07/2015  . Leukopenia 09/15/2015  . Acrochordon 07/18/2015  . Coronary artery disease   . PAF (paroxysmal atrial fibrillation) ([place])   . Morbid obesity ([place])   . Leg swelling 04/23/2015  . Transaminitis 04/23/2015  . Knee pain, bilateral 03/05/2015  . Depression 01/08/2015  . OSA (obstructive sleep apnea)   . Tobacco abuse 12/08/2014  . Physical deconditioning 12/08/2014  . Paroxysmal atrial fibrillation ([place]) 11/05/2014  . Hyperlipidemia LDL goal <70 11/05/2014  . Obesity 11/05/2014  . NSTEMI (non-ST elevated myocardial infarction) ([place])   . Back pain, lumbosacral 01/04/2013  . Thrombocytopenia ([place]) 11/29/2012  . Decreased libido 08/15/2011  . GERD (gastroesophageal reflux disease) 08/15/2011  . CAD (coronary artery disease) 07/29/2011  . Psoriasis 07/29/2011     Disposition: home  Discharge Condition: improved  Discharge Exam:  General: sleeping in bed comfortably but easily able to arouse, in NAD Cardiovascular: intermittently tachycardic, distant heart sounds, regular rhythm Respiratory: CTAB, normal work of breathing with sats in high 90s on room air, no wheezing or rhonchi Abdomen: obese, soft, non-tender, non-distended, +BS Extremities: LE non-edematous but diffusely tender on both calf and shin bilaterally, more on right, no cords palpated  Brief Hospital Course:  Patient presented with chest  pain. Patient reported waking up on night of admission with chest pressure, with pain radiating down his L arm and numbness in the fingers of his L hand. He had some improvement of symptoms at home with nitro, but given his cardiac history, decided to come to the ED.   In ED, patient's troponin was negative, and EKG showed no new ischemic changes. His pain returned but again improved with nitro. He was admitted for further cardiac work-up given cardiac history.   By second day of admission, patient's chest pain had resolved. He did develop a fever a few hours after admission, with Tmax 102.68F. This responded well to ibuprofen. He did not have an increased white blood cell count, and denied any viral symptoms. He was seen by cardiology, who felt that his pain was related to tachycardia 2/2 fever. They did not recommend further work-up, so no inpatient cardiac procedures were performed. Patient was subsequently determined stable for discharge home.   Issues for Follow Up:  1. Patient admitted to depressive symptoms, despite reportedly taking his bupropion as prescribed. He reported a lot of new life stressors, but denied SI or HI. Recommend following-up on mood.   Significant Procedures: none  Significant Labs and Imaging:   Recent Labs Lab 01/01/16 2211 01/02/16 1212 01/03/16 1011  WBC 4.5 4.8 3.6*  HGB 12.3* 11.3* 11.2*  HCT 35.0* 33.7* 33.5*  PLT 40* 36* 36*    Recent Labs Lab 01/01/16 2211 01/02/16 1212  NA 138 137  K 4.0 3.8  CL 105 106  CO2 23 24  GLUCOSE 110* 107*  BUN 6 8  CREATININE 0.77 0.89  CALCIUM 8.7* 8.3*  ALKPHOS  --  64  AST  --  68*  ALT  --  55  ALBUMIN  --  2.8*    Results/Tests Pending at Time of Discharge: none  Discharge Medications:    Medication List    STOP taking these medications        clindamycin 300 MG capsule  Commonly known as:  CLEOCIN     doxycycline 100 MG tablet  Commonly known as:  VIBRA-TABS     sulfamethoxazole-trimethoprim  800-160 MG tablet  Commonly known as:  BACTRIM DS,SEPTRA DS      TAKE these medications        aspirin 81 MG tablet  Take 1 tablet (81 mg total) by mouth daily.     atorvastatin 10 MG tablet  Commonly known as:  LIPITOR  Take 1 tablet (10 mg total) by mouth every evening.     buPROPion 150 MG 24 hr tablet  Commonly known as:  WELLBUTRIN XL  Take 150 mg by mouth daily.     clopidogrel 75 MG tablet  Commonly known as:  PLAVIX  Take 1 tablet (75 mg total) by mouth daily.     cyclobenzaprine 10 MG tablet  Commonly known as:  FLEXERIL  Take 1 tablet (10 mg total) by mouth 3 (three) times daily as needed for muscle spasms.     furosemide 20 MG tablet  Commonly known as:  LASIX  Take 20 mg by mouth daily.     HYDROcodone-acetaminophen 5-325 MG tablet  Commonly known as:  NORCO/VICODIN  Take 1 tablet by mouth every 6 (six) hours as needed for moderate pain or severe pain.     isosorbide mononitrate 30 MG 24 hr tablet  Commonly known as:  IMDUR  Take 1 tablet (30 mg total) by mouth daily.     metoprolol succinate 25 MG 24 hr tablet  Commonly known as:  TOPROL-XL  Take 0.5 tablets (12.5 mg total) by mouth daily.     naproxen 500 MG tablet  Commonly known as:  NAPROSYN  Take 1 tablet (500 mg total) by mouth 2 (two) times daily with a meal.     nitroGLYCERIN 0.4 MG SL tablet  Commonly known as:  NITROSTAT  Place 1 tablet (0.4 mg total) under the tongue every 5 (five) minutes as needed for chest pain (up to 3 doses).     pantoprazole 40 MG tablet  Commonly known as:  PROTONIX  Take 1 tablet (40 mg total) by mouth daily.        Discharge Instructions: Please refer to Patient Instructions section of EMR for full details.  Patient was counseled important signs and symptoms that should prompt return to medical care, changes in medications, dietary instructions, activity restrictions, and follow up appointments.   Follow-Up Appointments: Follow-up Information    Follow up  with Clearance Coots, MD On 01/08/2016.   Specialty:  Family Medicine   Why:  8:45   Contact information:   [place] [address] [phone]       Verner Mould, MD 01/04/2016, 1:55 PM PGY-1, [place]

## 2016-01-05 ENCOUNTER — Encounter: Payer: Self-pay | Admitting: Family Medicine

## 2016-01-05 LAB — RESPIRATORY VIRUS PANEL
ADENOVIRUS: NEGATIVE
Influenza A: NEGATIVE
Influenza B: NEGATIVE
METAPNEUMOVIRUS: NEGATIVE
PARAINFLUENZA 1 A: NEGATIVE
Parainfluenza 2: NEGATIVE
Parainfluenza 3: NEGATIVE
RESPIRATORY SYNCYTIAL VIRUS A: NEGATIVE
RHINOVIRUS: NEGATIVE
Respiratory Syncytial Virus B: NEGATIVE

## 2016-01-08 ENCOUNTER — Ambulatory Visit (INDEPENDENT_AMBULATORY_CARE_PROVIDER_SITE_OTHER): Payer: Medicaid Other | Admitting: Family Medicine

## 2016-01-08 ENCOUNTER — Encounter: Payer: Self-pay | Admitting: Family Medicine

## 2016-01-08 VITALS — BP 144/70 | HR 78 | Temp 98.0°F | Wt 297.7 lb

## 2016-01-08 DIAGNOSIS — M25561 Pain in right knee: Secondary | ICD-10-CM | POA: Diagnosis not present

## 2016-01-08 DIAGNOSIS — R079 Chest pain, unspecified: Secondary | ICD-10-CM

## 2016-01-08 DIAGNOSIS — F329 Major depressive disorder, single episode, unspecified: Secondary | ICD-10-CM | POA: Diagnosis not present

## 2016-01-08 DIAGNOSIS — M25562 Pain in left knee: Secondary | ICD-10-CM | POA: Diagnosis not present

## 2016-01-08 DIAGNOSIS — R4589 Other symptoms and signs involving emotional state: Secondary | ICD-10-CM

## 2016-01-08 MED ORDER — HYDROCODONE-ACETAMINOPHEN 5-325 MG PO TABS: 1.0000 | ORAL_TABLET | Freq: Four times a day (QID) | ORAL | Status: DC | PRN

## 2016-01-08 NOTE — Patient Instructions (Signed)
Thank you for coming in,   Please follow up with me as much as you need.   We can continue pain medications with the caveat that this is not a long term medication and surgery will most likely be necessary for your knee pain.   Please bring all of your medications with you to each visit.   Sign up for My Chart to have easy access to your labs results, and communication with your Primary care physician   Please feel free to call with any questions or concerns at any time, at [phone]. --Dr. Raeford Razor

## 2016-01-08 NOTE — Progress Notes (Signed)
Subjective:    Vincent Ochoa - 60 y.o. male MRN [id number]  Date of birth: 06-03-1956  CC chest pain   HPI  Vincent Ochoa is here for chest pain.   Chest pain:  He denies feeling any further chest pain.  Taking medications with no problems.  Has had three MI in the past.   B/L knee pain:  Has been to the ortho office and received an injection into hip and an epidural.  Pain still occuring in both knees. Marland Kitchen He received injection in June '16 and it lasted for about three weeks.  Location: b/l knee  Pain started: years  Pain is: ache  Severity: 10/10  Medications tried: tramadol, norco. (Avoiding NSAIDS due to history of Thrombocytopenia)  Recent trauma: no Similar pain previously: no  Symptoms Redness:no Swelling:no Fever: no Weakness: no Weight loss: no Rash: no  Feeling down:  Reports that he has been feeling down.  This has been worse recently but started about three years ago He brother in law lives with him and his wife and this puts a strain on their marriage.  The brother in law doesn't work and is given money by his wife  His step son also does laundry and eats at their house but does not contributed and is also given money by his wife.  He is not intimate with his wife  He reports having to sell his cows and pawn some of his guns in order to make house payments.  His wife works a full time job but he is unsure what she does with all of her money.  Denies any thoughts of SI or HI.  He reports that he loves his wife but when he tries to talk about these things, she will down play the situation.  He has contemplated leaving his wife but reports that he loves her.     Health Maintenance:  Health Maintenance Due  Topic Date Due  . COLONOSCOPY  02/27/2006    -  reports that he has been smoking Cigarettes.  He has a 8.6 pack-year smoking history. He has never used smokeless tobacco.    Review of Systems See HPI     Objective:   Physical  Exam BP 144/70 mmHg  Pulse 78  Temp(Src) 98 F (36.7 C) (Oral)  Wt 297 lb 11.2 oz (135.036 kg) Gen: NAD, alert, cooperative with exam,  CV: RRR, good S1/S2, no murmur, b/l LE edema, stasis dermatitis b/l LE, Resp: CTABL, no wheezes, non-labored Skin:  normal turgor  Neuro: no gross deficits.  Psych: alert and oriented, no SI or HI  Knee Exam:  Laterality: bilateral  Appearance: no ecchymosis or erythema  Edema: mild swelling b/l  Tenderness: TTP along joint line b/l  Range of Motion: limited extension on right more than left  Strength:  Quadricep: 5/5 Hamstring: 5/5 Gait: normal  Assessment & Plan:   Knee pain, bilateral Failed conservative measures (medications and injections) Would benefit from surgery at this point but doesn't want to pursue that yet  - norco given #20  Chest pain Recently admitted for chest pain.  Cardiology saw and recommended that e has been continued on aspirin and Plavix, at this time, the chest is the restenosis is very low. Has multiple negative caths  - they recommended for potential to increase his Imdur and continue all other medications.    Feeling down PHQ-9: 13 Had a long discussion today about how he is feeling and his relationship with  his wife  Was not able to offer much advice other than being an ear to listen  - offered that he can talk with me on a regular basis if he feels that helps  - offered integrative care which may be beneficial in the future  - advised that he and his wife seek counseling but he reports she will not go for it.  - follow 2 weeks or PRN

## 2016-01-10 DIAGNOSIS — F329 Major depressive disorder, single episode, unspecified: Secondary | ICD-10-CM

## 2016-01-10 DIAGNOSIS — R4589 Other symptoms and signs involving emotional state: Secondary | ICD-10-CM | POA: Insufficient documentation

## 2016-01-10 NOTE — Assessment & Plan Note (Signed)
Recently admitted for chest pain.  Cardiology saw and recommended that e has been continued on aspirin and Plavix, at this time, the chest is the restenosis is very low. Has multiple negative caths  - they recommended for potential to increase his Imdur and continue all other medications.

## 2016-01-10 NOTE — Assessment & Plan Note (Signed)
Failed conservative measures (medications and injections) Would benefit from surgery at this point but doesn't want to pursue that yet  - norco given #20

## 2016-01-10 NOTE — Assessment & Plan Note (Signed)
PHQ-9: 13 Had a long discussion today about how he is feeling and his relationship with his wife  Was not able to offer much advice other than being an ear to listen  - offered that he can talk with me on a regular basis if he feels that helps  - offered integrative care which may be beneficial in the future  - advised that he and his wife seek counseling but he reports she will not go for it.  - follow 2 weeks or PRN

## 2016-01-20 DIAGNOSIS — Z9289 Personal history of other medical treatment: Secondary | ICD-10-CM

## 2016-01-20 HISTORY — DX: Personal history of other medical treatment: Z92.89

## 2016-02-02 ENCOUNTER — Telehealth: Payer: Self-pay | Admitting: Family Medicine

## 2016-02-02 NOTE — Telephone Encounter (Signed)
Need refills on isosorbide mononitrate,furosemide and his hydrocodone.  Call when pain med need to be picked up.

## 2016-02-03 MED ORDER — ISOSORBIDE MONONITRATE ER 30 MG PO TB24: 30.0000 mg | ORAL_TABLET | Freq: Every day | ORAL | Status: DC

## 2016-02-03 MED ORDER — FUROSEMIDE 20 MG PO TABS: 20.0000 mg | ORAL_TABLET | Freq: Every day | ORAL | Status: DC

## 2016-02-03 MED ORDER — HYDROCODONE-ACETAMINOPHEN 5-325 MG PO TABS: 1.0000 | ORAL_TABLET | Freq: Four times a day (QID) | ORAL | Status: DC | PRN

## 2016-02-04 ENCOUNTER — Inpatient Hospital Stay (HOSPITAL_COMMUNITY): Payer: Medicaid Other

## 2016-02-04 ENCOUNTER — Inpatient Hospital Stay (HOSPITAL_COMMUNITY): Payer: Medicaid Other | Admitting: Certified Registered"

## 2016-02-04 ENCOUNTER — Ambulatory Visit: Admit: 2016-02-04 | Payer: Self-pay

## 2016-02-04 ENCOUNTER — Inpatient Hospital Stay (HOSPITAL_COMMUNITY)
Admission: AD | Admit: 2016-02-04 | Discharge: 2016-02-13 | DRG: 270 | Disposition: A | Discharge status: Home / Self Care | Payer: Medicaid Other | Source: Other Acute Inpatient Hospital | Attending: Internal Medicine | Admitting: Internal Medicine

## 2016-02-04 ENCOUNTER — Encounter (HOSPITAL_COMMUNITY)
Admission: AD | Disposition: A | Discharge status: Home / Self Care | Payer: Self-pay | Source: Other Acute Inpatient Hospital | Attending: Internal Medicine

## 2016-02-04 DIAGNOSIS — D6959 Other secondary thrombocytopenia: Secondary | ICD-10-CM | POA: Diagnosis present

## 2016-02-04 DIAGNOSIS — I480 Paroxysmal atrial fibrillation: Secondary | ICD-10-CM | POA: Diagnosis present

## 2016-02-04 DIAGNOSIS — Z9989 Dependence on other enabling machines and devices: Secondary | ICD-10-CM

## 2016-02-04 DIAGNOSIS — K766 Portal hypertension: Secondary | ICD-10-CM | POA: Diagnosis present

## 2016-02-04 DIAGNOSIS — K7031 Alcoholic cirrhosis of liver with ascites: Secondary | ICD-10-CM

## 2016-02-04 DIAGNOSIS — G4733 Obstructive sleep apnea (adult) (pediatric): Secondary | ICD-10-CM | POA: Diagnosis present

## 2016-02-04 DIAGNOSIS — D62 Acute posthemorrhagic anemia: Secondary | ICD-10-CM | POA: Diagnosis present

## 2016-02-04 DIAGNOSIS — D696 Thrombocytopenia, unspecified: Secondary | ICD-10-CM | POA: Diagnosis not present

## 2016-02-04 DIAGNOSIS — Z955 Presence of coronary angioplasty implant and graft: Secondary | ICD-10-CM

## 2016-02-04 DIAGNOSIS — R140 Abdominal distension (gaseous): Secondary | ICD-10-CM | POA: Diagnosis not present

## 2016-02-04 DIAGNOSIS — G4730 Sleep apnea, unspecified: Secondary | ICD-10-CM | POA: Diagnosis present

## 2016-02-04 DIAGNOSIS — K920 Hematemesis: Secondary | ICD-10-CM | POA: Diagnosis present

## 2016-02-04 DIAGNOSIS — I8501 Esophageal varices with bleeding: Secondary | ICD-10-CM

## 2016-02-04 DIAGNOSIS — E872 Acidosis: Secondary | ICD-10-CM | POA: Diagnosis not present

## 2016-02-04 DIAGNOSIS — E785 Hyperlipidemia, unspecified: Secondary | ICD-10-CM | POA: Diagnosis present

## 2016-02-04 DIAGNOSIS — R188 Other ascites: Secondary | ICD-10-CM

## 2016-02-04 DIAGNOSIS — Z7982 Long term (current) use of aspirin: Secondary | ICD-10-CM

## 2016-02-04 DIAGNOSIS — Z7902 Long term (current) use of antithrombotics/antiplatelets: Secondary | ICD-10-CM | POA: Diagnosis not present

## 2016-02-04 DIAGNOSIS — K219 Gastro-esophageal reflux disease without esophagitis: Secondary | ICD-10-CM | POA: Diagnosis present

## 2016-02-04 DIAGNOSIS — I864 Gastric varices: Secondary | ICD-10-CM | POA: Diagnosis present

## 2016-02-04 DIAGNOSIS — K922 Gastrointestinal hemorrhage, unspecified: Secondary | ICD-10-CM | POA: Diagnosis present

## 2016-02-04 DIAGNOSIS — G9340 Encephalopathy, unspecified: Secondary | ICD-10-CM | POA: Diagnosis not present

## 2016-02-04 DIAGNOSIS — J9690 Respiratory failure, unspecified, unspecified whether with hypoxia or hypercapnia: Secondary | ICD-10-CM

## 2016-02-04 DIAGNOSIS — Z79899 Other long term (current) drug therapy: Secondary | ICD-10-CM

## 2016-02-04 DIAGNOSIS — G9341 Metabolic encephalopathy: Secondary | ICD-10-CM | POA: Diagnosis present

## 2016-02-04 DIAGNOSIS — D500 Iron deficiency anemia secondary to blood loss (chronic): Secondary | ICD-10-CM | POA: Diagnosis not present

## 2016-02-04 DIAGNOSIS — Z6841 Body Mass Index (BMI) 40.0 and over, adult: Secondary | ICD-10-CM

## 2016-02-04 DIAGNOSIS — Z720 Tobacco use: Secondary | ICD-10-CM | POA: Diagnosis not present

## 2016-02-04 DIAGNOSIS — Z96659 Presence of unspecified artificial knee joint: Secondary | ICD-10-CM | POA: Diagnosis present

## 2016-02-04 DIAGNOSIS — F17210 Nicotine dependence, cigarettes, uncomplicated: Secondary | ICD-10-CM | POA: Diagnosis present

## 2016-02-04 DIAGNOSIS — I252 Old myocardial infarction: Secondary | ICD-10-CM

## 2016-02-04 DIAGNOSIS — I8500 Esophageal varices without bleeding: Secondary | ICD-10-CM

## 2016-02-04 DIAGNOSIS — Z791 Long term (current) use of non-steroidal anti-inflammatories (NSAID): Secondary | ICD-10-CM

## 2016-02-04 DIAGNOSIS — J9601 Acute respiratory failure with hypoxia: Secondary | ICD-10-CM | POA: Diagnosis present

## 2016-02-04 DIAGNOSIS — Z7901 Long term (current) use of anticoagulants: Secondary | ICD-10-CM | POA: Diagnosis not present

## 2016-02-04 DIAGNOSIS — I2510 Atherosclerotic heart disease of native coronary artery without angina pectoris: Secondary | ICD-10-CM | POA: Diagnosis present

## 2016-02-04 DIAGNOSIS — Z01818 Encounter for other preprocedural examination: Secondary | ICD-10-CM

## 2016-02-04 DIAGNOSIS — J9600 Acute respiratory failure, unspecified whether with hypoxia or hypercapnia: Secondary | ICD-10-CM

## 2016-02-04 DIAGNOSIS — E870 Hyperosmolality and hypernatremia: Secondary | ICD-10-CM | POA: Diagnosis present

## 2016-02-04 HISTORY — PX: RADIOLOGY WITH ANESTHESIA: SHX6223

## 2016-02-04 LAB — TROPONIN I: Troponin I: 0.03 ng/mL (ref <0.031)

## 2016-02-04 LAB — CBC
HCT: 22 % — ABNORMAL LOW (ref 39.0–52.0)
HEMOGLOBIN: 7.3 g/dL — AB (ref 13.0–17.0)
MCH: 31.5 pg (ref 26.0–34.0)
MCHC: 33.2 g/dL (ref 30.0–36.0)
MCV: 94.8 fL (ref 78.0–100.0)
Platelets: 87 10*3/uL — ABNORMAL LOW (ref 150–400)
RBC: 2.32 MIL/uL — AB (ref 4.22–5.81)
RDW: 16.1 % — ABNORMAL HIGH (ref 11.5–15.5)
WBC: 12.2 10*3/uL — ABNORMAL HIGH (ref 4.0–10.5)

## 2016-02-04 LAB — COMPREHENSIVE METABOLIC PANEL
ALBUMIN: 2.6 g/dL — AB (ref 3.5–5.0)
ALK PHOS: 54 U/L (ref 38–126)
ALT: 51 U/L (ref 17–63)
AST: 54 U/L — ABNORMAL HIGH (ref 15–41)
Anion gap: 6 (ref 5–15)
BUN: 37 mg/dL — AB (ref 6–20)
CALCIUM: 8 mg/dL — AB (ref 8.9–10.3)
CO2: 27 mmol/L (ref 22–32)
CREATININE: 1.16 mg/dL (ref 0.61–1.24)
Chloride: 118 mmol/L — ABNORMAL HIGH (ref 101–111)
GFR calc Af Amer: >60 mL/min (ref >60)
GFR calc non Af Amer: >60 mL/min (ref >60)
GLUCOSE: 136 mg/dL — AB (ref 65–99)
Potassium: 4.4 mmol/L (ref 3.5–5.1)
SODIUM: 151 mmol/L — AB (ref 135–145)
Total Bilirubin: 1.5 mg/dL — ABNORMAL HIGH (ref 0.3–1.2)
Total Protein: 5.2 g/dL — ABNORMAL LOW (ref 6.5–8.1)

## 2016-02-04 LAB — AMMONIA: AMMONIA: 84 umol/L — AB (ref 9–35)

## 2016-02-04 LAB — PHOSPHORUS: Phosphorus: 3.1 mg/dL (ref 2.5–4.6)

## 2016-02-04 LAB — PROTIME-INR
INR: 1.48 (ref 0.00–1.49)
PROTHROMBIN TIME: 18 s — AB (ref 11.6–15.2)

## 2016-02-04 LAB — GLUCOSE, CAPILLARY: GLUCOSE-CAPILLARY: 115 mg/dL — AB (ref 65–99)

## 2016-02-04 LAB — ABO/RH: ABO/RH(D): A NEG

## 2016-02-04 LAB — BILIRUBIN, DIRECT: BILIRUBIN DIRECT: 0.4 mg/dL (ref 0.1–0.5)

## 2016-02-04 LAB — MAGNESIUM: Magnesium: 2 mg/dL (ref 1.7–2.4)

## 2016-02-04 LAB — LACTIC ACID, PLASMA: Lactic Acid, Venous: 2.3 mmol/L (ref 0.5–2.0)

## 2016-02-04 LAB — MRSA PCR SCREENING: MRSA by PCR: NEGATIVE

## 2016-02-04 SURGERY — RADIOLOGY WITH ANESTHESIA
Anesthesia: General

## 2016-02-04 MED ORDER — GELATIN ABSORBABLE 12-7 MM EX MISC
CUTANEOUS | Status: AC
  Filled 2016-02-04: qty 1

## 2016-02-04 MED ORDER — PROPOFOL 10 MG/ML IV BOLUS
INTRAVENOUS | Status: DC | PRN
  Administered 2016-02-04: 150 mg via INTRAVENOUS

## 2016-02-04 MED ORDER — SODIUM CHLORIDE 0.9 % IV SOLN
INTRAVENOUS | Status: DC
  Administered 2016-02-04: 100 mL/h via INTRAVENOUS

## 2016-02-04 MED ORDER — LACTATED RINGERS IV SOLN
INTRAVENOUS | Status: DC | PRN
  Administered 2016-02-04: 22:00:00 via INTRAVENOUS

## 2016-02-04 MED ORDER — SODIUM CHLORIDE 0.9 % IV SOLN
50.0000 ug/h | INTRAVENOUS | Status: DC
  Administered 2016-02-04 – 2016-02-07 (×7): 50 ug/h via INTRAVENOUS
  Filled 2016-02-04 (×15): qty 1

## 2016-02-04 MED ORDER — SODIUM CHLORIDE 0.9 % IV SOLN: 10.0000 mL/h | Freq: Once | INTRAVENOUS | Status: DC

## 2016-02-04 MED ORDER — PANTOPRAZOLE SODIUM 40 MG IV SOLR: 40.0000 mg | Freq: Two times a day (BID) | INTRAVENOUS | Status: DC

## 2016-02-04 MED ORDER — SUCCINYLCHOLINE CHLORIDE 20 MG/ML IJ SOLN
INTRAMUSCULAR | Status: DC | PRN
  Administered 2016-02-04: 120 mg via INTRAVENOUS

## 2016-02-04 MED ORDER — SODIUM CHLORIDE 0.9 % IV SOLN: 250.0000 mL | INTRAVENOUS | Status: DC | PRN

## 2016-02-04 MED ORDER — SODIUM TETRADECYL SULFATE 1 % IV SOLN
6.0000 mL | Freq: Once | INTRAVENOUS | Status: DC
  Filled 2016-02-04: qty 6

## 2016-02-04 MED ORDER — LIDOCAINE HCL (CARDIAC) 20 MG/ML IV SOLN
INTRAVENOUS | Status: DC | PRN
  Administered 2016-02-04: 80 mg via INTRAVENOUS

## 2016-02-04 MED ORDER — PHENYLEPHRINE HCL 10 MG/ML IJ SOLN
INTRAMUSCULAR | Status: DC | PRN
  Administered 2016-02-04: 80 ug via INTRAVENOUS

## 2016-02-04 MED ORDER — SODIUM CHLORIDE 0.9 % IV SOLN
80.0000 mg | Freq: Once | INTRAVENOUS | Status: AC
  Administered 2016-02-04: 80 mg via INTRAVENOUS
  Filled 2016-02-04 (×2): qty 80

## 2016-02-04 MED ORDER — SODIUM CHLORIDE 0.9 % IV SOLN
8.0000 mg/h | INTRAVENOUS | Status: DC
  Administered 2016-02-04 – 2016-02-07 (×6): 8 mg/h via INTRAVENOUS
  Filled 2016-02-04 (×13): qty 80

## 2016-02-04 MED ORDER — FENTANYL CITRATE (PF) 100 MCG/2ML IJ SOLN
INTRAMUSCULAR | Status: DC | PRN
  Administered 2016-02-04: 100 ug via INTRAVENOUS
  Administered 2016-02-04: 50 ug via INTRAVENOUS

## 2016-02-04 MED ORDER — SODIUM CHLORIDE 0.9 % IV SOLN
INTRAVENOUS | Status: DC | PRN
  Administered 2016-02-04: 22:00:00 via INTRAVENOUS

## 2016-02-04 MED ORDER — DEXTROSE 5 % IV SOLN
2.0000 g | INTRAVENOUS | Status: AC
  Administered 2016-02-04 – 2016-02-08 (×5): 2 g via INTRAVENOUS
  Filled 2016-02-04 (×5): qty 2

## 2016-02-04 NOTE — Progress Notes (Signed)
Patient admitted from [facility].  Patient placed on monitor.  No c/o pain,  Or active bleeding seen.  Patient resting at this time. Will monitor.

## 2016-02-04 NOTE — H&P (Signed)
Chief Complaint: Hematemesis  Referring Physician(s): Tri Le ([facility])  Supervising Physician: Jacqulynn Cadet  History of Present Illness: Vincent Ochoa is a 60 y.o. 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  Underwent EGD findings small esophageal varix and large gastric varices, but apparently active bleeding was not seen.   Pt determined to be at high risk for recurrent bleeding.  IR is consulted to consider BRTO(Balloon-occluded retrograde transvenous obliteration) procedure.  Pt transferred to [facility] from [facility] and is currently stable. Received additional unit of PRBC today for Hgb of 6.8.  Past Medical History  Diagnosis Date  . GERD (gastroesophageal reflux disease)   . Psoriasis   . Frequent headaches   . Facial cellulitis 2014    Periorbital  . Coronary artery disease     a. Evaluated by Dr. Stanford Breed 2012 - nonobstructive 2007-2008 by cath. b. Abnormal stress test 10/2014 but cath deferred temporarily due to thrombocytopenia. c. 10/2014: readm with AF-RVR/NSTEMI - s/p BMS to mLCx 11/04/14 (mod dz in RCA);  d. relook cath 01/2015 and 06/02/2015 patent stent, nonobs CAD, EF 55-65%.  . Back pain, lumbosacral 2014  . Thrombocytopenia ([place])     a. Onset unclear, but noted 2012. ? 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 s/p prednisone, IVIG.  Marland Kitchen Hyperlipidemia   . Obesity   . Elevated LFTs   . Fatty liver US - 2012  . PAF (paroxysmal atrial fibrillation) ([place])     a. Episode 10/2014 at time of NSTEMI, spont converted to NSR. Observation for further episodes (not on anticoag at present time due to [facility] = 1, thrombocytopenia).  . Sinus bradycardia   . OSA (obstructive sleep apnea)     severe with AHI 56/hr with successful CPAP titration to 18cm H2O  . Morbid obesity [facility])     Past Surgical History  Procedure Laterality Date  . Tonsillectomy  1960's  . Cholecystectomy  2007  . Knee  arthroplasty  1970's  . Facial cosmetic surgery  2014  . Cardiac catheterization    . Left heart catheterization with coronary angiogram N/A 11/04/2014    Procedure: LEFT HEART CATHETERIZATION WITH CORONARY ANGIOGRAM;  Surgeon: Jettie Booze, MD;  Location: [facility] CATH LAB;  Service: Cardiovascular;  Laterality: N/A;  . Left heart catheterization with coronary angiogram N/A 01/28/2015    Procedure: LEFT HEART CATHETERIZATION WITH CORONARY ANGIOGRAM;  Surgeon: Sherren Mocha, MD;  Location: [facility] CATH LAB;  Service: Cardiovascular;  Laterality: N/A;  . Cardiac catheterization N/A 06/02/2015    Procedure: Left Heart Cath and Coronary Angiography;  Surgeon: Jettie Booze, MD;  Location: [facility] CV LAB;  Service: Cardiovascular;  Laterality: N/A;    Allergies: Review of patient's allergies indicates no known allergies.  Medications: Prior to Admission medications   Medication Sig Start Date End Date Taking? Authorizing Provider  aspirin 81 MG tablet Take 1 tablet (81 mg total) by mouth daily. 10/31/14   Dayna N Dunn, PA-C  atorvastatin (LIPITOR) 10 MG tablet Take 1 tablet (10 mg total) by mouth every evening. Patient not taking: Reported on 01/01/2016 12/16/14   Sherren Mocha, MD  buPROPion (WELLBUTRIN XL) 150 MG 24 hr tablet Take 150 mg by mouth daily.    Historical Provider, MD  clopidogrel (PLAVIX) 75 MG tablet Take 1 tablet (75 mg total) by mouth daily. 06/10/15   Rogelia Mire, NP  cyclobenzaprine (FLEXERIL) 10 MG tablet Take 1 tablet (10 mg total) by  mouth 3 (three) times daily as needed for muscle spasms. Patient not taking: Reported on 01/01/2016 09/14/15   Rosemarie Ax, MD  furosemide (LASIX) 20 MG tablet Take 1 tablet (20 mg total) by mouth daily. 02/03/16   Rosemarie Ax, MD  HYDROcodone-acetaminophen (NORCO/VICODIN) 5-325 MG tablet Take 1 tablet by mouth every 6 (six) hours as needed for moderate pain or severe pain. 02/03/16   Rosemarie Ax, MD  isosorbide  mononitrate (IMDUR) 30 MG 24 hr tablet Take 1 tablet (30 mg total) by mouth daily. 02/03/16   Rosemarie Ax, MD  metoprolol succinate (TOPROL-XL) 25 MG 24 hr tablet Take 0.5 tablets (12.5 mg total) by mouth daily. 02/26/15   Sherren Mocha, MD  naproxen (NAPROSYN) 500 MG tablet Take 1 tablet (500 mg total) by mouth 2 (two) times daily with a meal. Patient not taking: Reported on 01/01/2016 10/12/15   Rosemarie Ax, MD  nitroGLYCERIN (NITROSTAT) 0.4 MG SL tablet Place 1 tablet (0.4 mg total) under the tongue every 5 (five) minutes as needed for chest pain (up to 3 doses). 10/31/14   Dayna N Dunn, PA-C  pantoprazole (PROTONIX) 40 MG tablet Take 1 tablet (40 mg total) by mouth daily. Patient not taking: Reported on 01/01/2016 10/31/14   Charlie Pitter, PA-C     Family History  Problem Relation Age of Onset  . Arthritis Maternal Grandmother   . Heart disease Sister   . Diabetes Sister   . Cancer - Ovarian Sister   . Heart disease      Maternal/paternal grandparents  . Cirrhosis Mother   . Heart attack Mother   . Heart attack Maternal Aunt   . Hypertension Mother   . Hypertension Father   . Stroke Father     Social History   Social History  . Marital Status: Married    Spouse Name: N/A  . Number of Children: N/A  . Years of Education: N/A   Social History Main Topics  . Smoking status: Current Some Day Smoker -- 0.20 packs/day for 43 years    Types: Cigarettes  . Smokeless tobacco: Never Used     Comment: 11/28/2012 "chew occasionally"  Quit for 2.5 months in early 2016  . Alcohol Use: 0.0 oz/week    0 Standard drinks or equivalent per week     Comment: 11/28/2012 "beer q 6 months or so"  . Drug Use: No  . Sexual Activity: No   Other Topics Concern  . Not on file   Social History Narrative   Lives in [place].    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       Review of Systems: A 12 point ROS    Review of Systems  Constitutional: Positive for fatigue. Negative for fever.  Respiratory: Negative.  Cardiovascular: Negative.  Gastrointestinal: Positive for vomiting and abdominal pain.  Genitourinary: Negative.  Musculoskeletal: Negative.  Skin: Negative.  Vital Signs: There were no vitals taken for this visit.  Physical Exam  Constitutional:  Morbidly obese male, somewhat lethargic. Had to keep waking pt up to discuss.  HENT:  Head: Normocephalic.  Mouth/Throat: Oropharynx is clear and moist.  Neck: No JVD present. No tracheal deviation present.  Cardiovascular: Regular rhythm and normal heart sounds.  Mild tachycardia  Pulmonary/Chest: Effort normal and breath sounds normal. No respiratory distress.  Abdominal: Soft. He exhibits no distension. There is  no tenderness.    Imaging: No results found.  Labs:  CBC:  Recent Labs  10/02/15 2337 01/01/16 2211 01/02/16 1212 01/03/16 1011  WBC 5.0 4.5 4.8 3.6*  HGB 12.4* 12.3* 11.3* 11.2*  HCT 36.2* 35.0* 33.7* 33.5*  PLT 38* 40* 36* 36*    COAGS:  Recent Labs  06/02/15 0715  INR 1.35    BMP:  Recent Labs  06/01/15 1527 10/02/15 2337 01/01/16 2211 01/02/16 1212  NA 138 138 138 137  K 4.0 3.8 4.0 3.8  CL 106 108 105 106  CO2 27 23 23 24   GLUCOSE 97 97 110* 107*  BUN 9 10 6 8   CALCIUM 9.1 8.6* 8.7* 8.3*  CREATININE 0.97 0.91 0.77 0.89  GFRNONAA >60 >60 >60 >60  GFRAA >60 >60 >60 >60    LIVER FUNCTION TESTS:  Recent Labs  10/02/15 2337 01/02/16 1212  BILITOT 1.7* 3.2*  AST 106* 68*  ALT 83* 55  ALKPHOS 71 64  PROT 6.5 6.0*  ALBUMIN 3.2* 2.8*    TUMOR MARKERS: No results for input(s): AFPTM, CEA, CA199, CHROMGRNA in the last 8760 hours.  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  Hemodynamically stable  Transferred here to [facility] in preparation for BRTO procedure.  Procedure was  discussed at length with pt including risks, complications such as bleeding, infection, renal failure from contrast use, treatment failure, or even death.  Pt again, fairly sleepy/lethargic during interview, will likely need to discuss and obtain consent from pt wife.  Wife is [name], [phone] or [phone]   Electronically Signed: Murrell Redden PA-C 02/04/2016, 4:38 PM

## 2016-02-04 NOTE — Progress Notes (Signed)
CRITICAL VALUE ALERT  Critical value received:  Lactic 2.3  Date of notification:  02/04/16   Time of notification:  2006  Critical value read back:Yes.    Nurse who received alert:  DeDe  MD notified (1st page):  ELink-McQuaid  Time of first page:  2006  MD notified (2nd page):  Time of second page:  Responding MD:  [place]  Time MD responded:  2007

## 2016-02-04 NOTE — Anesthesia Preprocedure Evaluation (Addendum)
Anesthesia Evaluation  Patient identified by MRN, date of birth, ID band Patient awake    Reviewed: Allergy & Precautions, NPO status , Patient's Chart, lab work & pertinent test results  Airway Mallampati: I  TM Distance: >3 FB     Dental   Pulmonary sleep apnea , Current Smoker,    breath sounds clear to auscultation       Cardiovascular + CAD and + Past MI   Rhythm:Regular Rate:Normal     Neuro/Psych    GI/Hepatic Neg liver ROS, GERD  ,  Endo/Other  negative endocrine ROS  Renal/GU negative Renal ROS     Musculoskeletal   Abdominal   Peds  Hematology   Anesthesia Other Findings   Reproductive/Obstetrics                            Anesthesia Physical Anesthesia Plan  ASA: IV and emergent  Anesthesia Plan: General   Post-op Pain Management:    Induction: Intravenous, Rapid sequence and Cricoid pressure planned  Airway Management Planned: Oral ETT  Additional Equipment:   Intra-op Plan:   Post-operative Plan: Possible Post-op intubation/ventilation  Informed Consent: I have reviewed the patients History and Physical, chart, labs and discussed the procedure including the risks, benefits and alternatives for the proposed anesthesia with the patient or authorized representative who has indicated his/her understanding and acceptance.   Dental advisory given  Plan Discussed with: CRNA, Anesthesiologist and Surgeon  Anesthesia Plan Comments:         Anesthesia Quick Evaluation

## 2016-02-04 NOTE — H&P (Signed)
PULMONARY / CRITICAL CARE MEDICINE   Name: Vincent Ochoa MRN: [id number] DOB: [birth year]    ADMISSION DATE:  02/04/2016 CONSULTATION DATE:  02/04/16  REFERRING MD:  [facility]  CHIEF COMPLAINT:  Hematemesis  HISTORY OF PRESENT ILLNESS:   Vincent Ochoa is a 60 y.o. male with PMH as outlined below including cirrhosis.  He presented to ED at [facility] on 03/15 with hematemesis that began suddenly the night prior around 2300.  He had one large episode of vomiting and on arrival to ED, he was hypotensive and looked critically ill.  Per his wife, he vomited so much of blood that it filled at least 2 small trash cans.  Blood was dark in color without any coffee grounds or clots.  Of note, pt is on plavix and ASA as outpatient which he takes for cardiac stent.  He had not had any similar symptoms in the past and denied heavy NSAID use.  He drinks minimally now but does apparently have hx of heavy ETOH use back in the 90's.  GI consulted 03/15 for hematemesis.  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  Also required PRBC transfusion.  Had bleeding that recurred AM 03/16.  Seen agaom by GI who reviewed case with IR and pt deemed to be a candidate for BRTO.  Pt was subsequently transferred to [facility] for procedure.  On arrival to [facility] ICU, he is awake but is somnolent and somewhat encephalopathic.  He is able to answer basic questions but is confused and unable to tell me where he is and why he is here; however, he is able to confirm that he has had hematemesis.   PAST MEDICAL HISTORY :  He  has a past medical history of GERD (gastroesophageal reflux disease); Psoriasis; Frequent headaches; Facial cellulitis (2014); Coronary artery disease; Back pain, lumbosacral (2014); Thrombocytopenia ([place]); Hyperlipidemia; Obesity; Elevated LFTs; Fatty liver ([place] - 2012); PAF (paroxysmal atrial fibrillation) ([place]); Sinus bradycardia; OSA (obstructive sleep apnea); and Morbid  obesity ([place]).  PAST SURGICAL HISTORY: He  has past surgical history that includes Tonsillectomy (1960's); Cholecystectomy (2007); Knee Arthroplasty (1970's); Facial cosmetic surgery (2014); Cardiac catheterization; left heart catheterization with coronary angiogram (N/A, 11/04/2014); left heart catheterization with coronary angiogram (N/A, 01/28/2015); and Cardiac catheterization (N/A, 06/02/2015).  No Known Allergies  No current facility-administered medications on file prior to encounter.   Current Outpatient Prescriptions on File Prior to Encounter  Medication Sig  . aspirin 81 MG tablet Take 1 tablet (81 mg total) by mouth daily.  Marland Kitchen atorvastatin (LIPITOR) 10 MG tablet Take 1 tablet (10 mg total) by mouth every evening. (Patient not taking: Reported on 01/01/2016)  . buPROPion (WELLBUTRIN XL) 150 MG 24 hr tablet Take 150 mg by mouth daily.  . clopidogrel (PLAVIX) 75 MG tablet Take 1 tablet (75 mg total) by mouth daily.  . cyclobenzaprine (FLEXERIL) 10 MG tablet Take 1 tablet (10 mg total) by mouth 3 (three) times daily as needed for muscle spasms. (Patient not taking: Reported on 01/01/2016)  . furosemide (LASIX) 20 MG tablet Take 1 tablet (20 mg total) by mouth daily.  Marland Kitchen HYDROcodone-acetaminophen (NORCO/VICODIN) 5-325 MG tablet Take 1 tablet by mouth every 6 (six) hours as needed for moderate pain or severe pain.  . isosorbide mononitrate (IMDUR) 30 MG 24 hr tablet Take 1 tablet (30 mg total) by mouth daily.  . metoprolol succinate (TOPROL-XL) 25 MG 24 hr tablet Take 0.5 tablets (12.5 mg total) by mouth  daily.  . naproxen (NAPROSYN) 500 MG tablet Take 1 tablet (500 mg total) by mouth 2 (two) times daily with a meal. (Patient not taking: Reported on 01/01/2016)  . nitroGLYCERIN (NITROSTAT) 0.4 MG SL tablet Place 1 tablet (0.4 mg total) under the tongue every 5 (five) minutes as needed for chest pain (up to 3 doses).  . pantoprazole (PROTONIX) 40 MG tablet Take 1 tablet (40 mg total) by mouth  daily. (Patient not taking: Reported on 01/01/2016)    FAMILY HISTORY:  His indicated that his mother is deceased. He indicated that his father is deceased.   SOCIAL HISTORY: He  reports that he has been smoking Cigarettes.  He has a 8.6 pack-year smoking history. He has never used smokeless tobacco. He reports that he drinks alcohol. He reports that he does not use illicit drugs.  REVIEW OF SYSTEMS:  All negative; except for those that are bolded, which indicate positives.  Constitutional: weight loss, weight gain, night sweats, fevers, chills, fatigue, weakness.  HEENT: headaches, sore throat, sneezing, nasal congestion, post nasal drip, difficulty swallowing, tooth/dental problems, visual complaints, visual changes, ear aches. Neuro: difficulty with speech, weakness, numbness, ataxia. CV:  chest pain, orthopnea, PND, swelling in lower extremities, dizziness, palpitations, syncope.  Resp: cough, hemoptysis, dyspnea, wheezing. GI  heartburn, indigestion, abdominal pain, nausea, vomiting, diarrhea, constipation, change in bowel habits, loss of appetite, hematemesis, melena, hematochezia.  GU: dysuria, change in color of urine, urgency or frequency, flank pain, hematuria. MSK: joint pain or swelling, decreased range of motion. Psych: change in mood or affect, depression, anxiety, suicidal ideations, homicidal ideations. Skin: rash, itching, bruising.   SUBJECTIVE: Denies chest pain, SOB, N/V/D.  Has not had any bleeding since at [facility].  VITAL SIGNS: There were no vitals taken for this visit.  HEMODYNAMICS:    VENTILATOR SETTINGS:    INTAKE / OUTPUT:     PHYSICAL EXAMINATION: General: Adult male, in NAD. Neuro: Awake, somnolent, confused. HEENT: [place]/AT. PERRL, sclerae anicteric. Cardiovascular: RRR, no M/R/G.  Lungs: Respirations even and unlabored.  CTA bilaterally, No W/R/R. Abdomen: BS x 4, soft, NT/ND.  Musculoskeletal: No gross deformities, no edema.  Skin: Intact,  warm, no rashes.  LABS:  BMET No results for input(s): NA, K, CL, CO2, BUN, CREATININE, GLUCOSE in the last 168 hours.  Electrolytes No results for input(s): CALCIUM, MG, PHOS in the last 168 hours.  CBC No results for input(s): WBC, HGB, HCT, PLT in the last 168 hours.  Coag's No results for input(s): APTT, INR in the last 168 hours.  Sepsis Markers No results for input(s): LATICACIDVEN, PROCALCITON, O2SATVEN in the last 168 hours.  ABG No results for input(s): PHART, PCO2ART, PO2ART in the last 168 hours.  Liver Enzymes No results for input(s): AST, ALT, ALKPHOS, BILITOT, ALBUMIN in the last 168 hours.  Cardiac Enzymes No results for input(s): TROPONINI, PROBNP in the last 168 hours.  Glucose  Recent Labs Lab 02/04/16 1732  GLUCAP 115*    Imaging No results found.   STUDIES:  EGD 03/15 > gastric varices but no active bleeding.  CULTURES: None.  ANTIBIOTICS: Ceftriaxone 03/15 >  SIGNIFICANT EVENTS: 03/15 > admitted to [facility] with hematemesis. 03/16 > transferred to [facility] for BRTO.  LINES/TUBES: None.  DISCUSSION: 60 y.o. M with cirrhosis, admitted to [facility] 03/15 for hematemesis.  Had anemia requiring transfusion and EGD which did not show active bleed.  On 03/16, he was transferred to [facility] for BRTO procedure.  ASSESSMENT / PLAN:  GASTROINTESTINAL A:  Hematemesis - unclear etiology; varices vs PUD. Cirrhosis. GI prophylaxis. Nutrition. P:   IR aware of pt arrival - planning for BRTO tonight. Consider GI consult if continued bleeding. Continue octreotide, PPI infusions. Continue ceftriaxone per ID section. NPO.  HEMATOLOGIC A:   Acute blood loss anemia. Hx thrombocytopenia. VTE Prophylaxis. P:  Transfuse for Hgb < 7. Monitor platelet counts. SCD's. CBC and coags now. CBC in AM.  PULMONARY A: Acute hypoxic respiratory failure. Hx OSA - on CPAP. Tobacco use disorder. P:   Continue supplemental O2 as needed to maintain SpO2 > 92%. Avoid  CPAP for now given hx hematemesis. CXR in AM. Tobacco cessation counseling.  CARDIOVASCULAR A:  Hx CAD s/p PCI (on plavix, ASA), HLD, PAF. P:  Monitor hemodynamics. Hold outpatient plavix, ASA, atorvastatin, furosemide, imdur, toprol-xl, nitro.  RENAL A:   No acute issues. P:   NS @ 100. CMP now. BMP in AM.  INFECTIOUS A:   SBP prophylaxis. P:   Abx as above (ceftriaxone).   ENDOCRINE A:   No acute issues. P:   Monitor glucose on BMP.  NEUROLOGIC A:   Acute metabolic encephalopathy. Hx back pain. P:   Monitor clinically. Assess ammonia. Limit sedating meds. Hold outpatient bupropion, cyclobenzaprine, norco.   Family updated: None.  Interdisciplinary Family Meeting v Palliative Care Meeting:  Due by: 03/23.  CC time: 35 minutes.   Montey Hora, [facility] Pager: [phone]  or [phone] 02/04/2016, 5:37 PM

## 2016-02-04 NOTE — Anesthesia Procedure Notes (Signed)
Procedure Name: Intubation Date/Time: 02/04/2016 10:17 PM Performed by: Manuela Schwartz B Pre-anesthesia Checklist: Patient identified, Emergency Drugs available, Suction available, Patient being monitored and Timeout performed Patient Re-evaluated:Patient Re-evaluated prior to inductionOxygen Delivery Method: Circle system utilized Preoxygenation: Pre-oxygenation with 100% oxygen Intubation Type: IV induction, Rapid sequence and Cricoid Pressure applied Laryngoscope Size: Mac and 3 Grade View: Grade I Tube type: Oral Tube size: 8.0 mm Number of attempts: 1 Airway Equipment and Method: Stylet Placement Confirmation: ETT inserted through vocal cords under direct vision,  positive ETCO2 and breath sounds checked- equal and bilateral Secured at: 22 cm Tube secured with: Tape Dental Injury: Teeth and Oropharynx as per pre-operative assessment

## 2016-02-04 NOTE — Progress Notes (Signed)
eLink Physician-Brief Progress Note Patient Name: Vincent Ochoa DOB: 1956-01-05 MRN: [id number]   Date of Service  02/04/2016  HPI/Events of Note  Very mildly elevated lactic acid in setting of GI bleed and cirrhosis> not surprising nor representative of the severity of his acute illness, more reflective of his liver disease  eICU Interventions  Repeat, monitor HD status     Intervention Category Intermediate Interventions: Diagnostic test evaluation  Simonne Maffucci 02/04/2016, 8:31 PM

## 2016-02-04 NOTE — Progress Notes (Signed)
eLink Physician-Brief Progress Note Patient Name: Vincent Ochoa DOB: [birth year] MRN: [id number]   Date of Service  02/04/2016  HPI/Events of Note  Hgb 7.3 Not actively bleeding per nurse, no melena, no hematemesis, and he was hemodynamically stable  eICU Interventions  Hold transfusion for now     Intervention Category Major Interventions: Hemorrhage - evaluation and management  Simonne Maffucci 02/04/2016, 10:43 PM

## 2016-02-05 ENCOUNTER — Inpatient Hospital Stay (HOSPITAL_COMMUNITY): Payer: Medicaid Other

## 2016-02-05 ENCOUNTER — Encounter (HOSPITAL_COMMUNITY): Payer: Self-pay | Admitting: Radiology

## 2016-02-05 DIAGNOSIS — G9340 Encephalopathy, unspecified: Secondary | ICD-10-CM

## 2016-02-05 DIAGNOSIS — D500 Iron deficiency anemia secondary to blood loss (chronic): Secondary | ICD-10-CM

## 2016-02-05 DIAGNOSIS — J9601 Acute respiratory failure with hypoxia: Secondary | ICD-10-CM

## 2016-02-05 DIAGNOSIS — J9600 Acute respiratory failure, unspecified whether with hypoxia or hypercapnia: Secondary | ICD-10-CM | POA: Diagnosis present

## 2016-02-05 LAB — CBC
HCT: 24.9 % — ABNORMAL LOW (ref 39.0–52.0)
HCT: 20 % — ABNORMAL LOW (ref 39.0–52.0)
Hemoglobin: 8.6 g/dL — ABNORMAL LOW (ref 13.0–17.0)
Hemoglobin: 6.8 g/dL — CL (ref 13.0–17.0)
MCH: 32.2 pg (ref 26.0–34.0)
MCH: 31.6 pg (ref 26.0–34.0)
MCHC: 34.5 g/dL (ref 30.0–36.0)
MCHC: 34 g/dL (ref 30.0–36.0)
MCV: 93.3 fL (ref 78.0–100.0)
MCV: 93 fL (ref 78.0–100.0)
PLATELETS: 63 10*3/uL — AB (ref 150–400)
PLATELETS: 20 10*3/uL — AB (ref 150–400)
RBC: 2.67 MIL/uL — AB (ref 4.22–5.81)
RBC: 2.15 MIL/uL — ABNORMAL LOW (ref 4.22–5.81)
RDW: 16.7 % — ABNORMAL HIGH (ref 11.5–15.5)
RDW: 17.1 % — AB (ref 11.5–15.5)
WBC: 11 10*3/uL — ABNORMAL HIGH (ref 4.0–10.5)
WBC: 8.1 10*3/uL (ref 4.0–10.5)

## 2016-02-05 LAB — BASIC METABOLIC PANEL
ANION GAP: 10 (ref 5–15)
Anion gap: 10 (ref 5–15)
BUN: 38 mg/dL — ABNORMAL HIGH (ref 6–20)
BUN: 44 mg/dL — AB (ref 6–20)
CALCIUM: 7.7 mg/dL — AB (ref 8.9–10.3)
CHLORIDE: 117 mmol/L — AB (ref 101–111)
CO2: 23 mmol/L (ref 22–32)
CO2: 23 mmol/L (ref 22–32)
CREATININE: 1.25 mg/dL — AB (ref 0.61–1.24)
Calcium: 7.7 mg/dL — ABNORMAL LOW (ref 8.9–10.3)
Chloride: 118 mmol/L — ABNORMAL HIGH (ref 101–111)
Creatinine, Ser: 1.42 mg/dL — ABNORMAL HIGH (ref 0.61–1.24)
GFR calc Af Amer: >60 mL/min (ref >60)
GFR calc non Af Amer: >60 mL/min (ref >60)
GFR, EST NON AFRICAN AMERICAN: 53 mL/min — AB (ref >60)
GLUCOSE: 136 mg/dL — AB (ref 65–99)
Glucose, Bld: 139 mg/dL — ABNORMAL HIGH (ref 65–99)
Potassium: 6 mmol/L — ABNORMAL HIGH (ref 3.5–5.1)
Potassium: 4.8 mmol/L (ref 3.5–5.1)
SODIUM: 151 mmol/L — AB (ref 135–145)
Sodium: 150 mmol/L — ABNORMAL HIGH (ref 135–145)

## 2016-02-05 LAB — BLOOD GAS, ARTERIAL
ACID-BASE DEFICIT: 2.1 mmol/L — AB (ref 0.0–2.0)
Acid-base deficit: 2.5 mmol/L — ABNORMAL HIGH (ref 0.0–2.0)
BICARBONATE: 24.9 meq/L — AB (ref 20.0–24.0)
Bicarbonate: 22.9 mEq/L (ref 20.0–24.0)
DRAWN BY: 449561
Drawn by: 398981
FIO2: 0.7
FIO2: 0.5
LHR: 14 {breaths}/min
MECHVT: 520 mL
O2 SAT: 93.5 %
O2 SAT: 97.9 %
PATIENT TEMPERATURE: 99.1
PCO2 ART: 70.9 mmHg — AB (ref 35.0–45.0)
PEEP/CPAP: 5 cmH2O
PEEP: 5 cmH2O
PH ART: 7.331 — AB (ref 7.350–7.450)
PO2 ART: 104 mmHg — AB (ref 80.0–100.0)
Patient temperature: 98.6
RATE: 22 resp/min
TCO2: 27 mmol/L (ref 0–100)
TCO2: 24.3 mmol/L (ref 0–100)
VT: 520 mL
pCO2 arterial: 44.5 mmHg (ref 35.0–45.0)
pH, Arterial: 7.173 — CL (ref 7.350–7.450)
pO2, Arterial: 87 mmHg (ref 80.0–100.0)

## 2016-02-05 LAB — GLUCOSE, CAPILLARY
GLUCOSE-CAPILLARY: 131 mg/dL — AB (ref 65–99)
GLUCOSE-CAPILLARY: 121 mg/dL — AB (ref 65–99)
GLUCOSE-CAPILLARY: 128 mg/dL — AB (ref 65–99)
GLUCOSE-CAPILLARY: 138 mg/dL — AB (ref 65–99)
Glucose-Capillary: 137 mg/dL — ABNORMAL HIGH (ref 65–99)
Glucose-Capillary: 133 mg/dL — ABNORMAL HIGH (ref 65–99)

## 2016-02-05 LAB — PROTIME-INR
INR: 1.45 (ref 0.00–1.49)
PROTHROMBIN TIME: 17.7 s — AB (ref 11.6–15.2)

## 2016-02-05 LAB — PREPARE RBC (CROSSMATCH)

## 2016-02-05 LAB — MAGNESIUM: Magnesium: 2.2 mg/dL (ref 1.7–2.4)

## 2016-02-05 LAB — PHOSPHORUS: Phosphorus: 4.9 mg/dL — ABNORMAL HIGH (ref 2.5–4.6)

## 2016-02-05 LAB — TRIGLYCERIDES: TRIGLYCERIDES: 202 mg/dL — AB (ref <150)

## 2016-02-05 LAB — LACTIC ACID, PLASMA: Lactic Acid, Venous: 2 mmol/L (ref 0.5–2.0)

## 2016-02-05 LAB — TROPONIN I: TROPONIN I: 0.05 ng/mL — AB (ref <0.031)

## 2016-02-05 MED ORDER — SODIUM TETRADECYL SULFATE 3 % IV SOLN
6.0000 mL | Freq: Once | INTRAVENOUS | Status: AC
  Administered 2016-02-05: 6 mL via INTRAVENOUS
  Filled 2016-02-05: qty 6

## 2016-02-05 MED ORDER — FENTANYL CITRATE (PF) 100 MCG/2ML IJ SOLN
100.0000 ug | INTRAMUSCULAR | Status: DC | PRN
  Administered 2016-02-05: 100 ug via INTRAVENOUS
  Filled 2016-02-05 (×2): qty 2

## 2016-02-05 MED ORDER — PROPOFOL 1000 MG/100ML IV EMUL
0.0000 ug/kg/min | INTRAVENOUS | Status: DC
  Administered 2016-02-05 (×2): 50 ug/kg/min via INTRAVENOUS
  Administered 2016-02-05: 25 ug/kg/min via INTRAVENOUS
  Administered 2016-02-05: 50 ug/kg/min via INTRAVENOUS
  Filled 2016-02-05 (×2): qty 100

## 2016-02-05 MED ORDER — FENTANYL CITRATE (PF) 100 MCG/2ML IJ SOLN: 100.0000 ug | INTRAMUSCULAR | Status: DC | PRN

## 2016-02-05 MED ORDER — DEXTROSE 5 % IV SOLN
INTRAVENOUS | Status: DC
  Administered 2016-02-05: 10:00:00 via INTRAVENOUS

## 2016-02-05 MED ORDER — FENTANYL CITRATE (PF) 100 MCG/2ML IJ SOLN
INTRAMUSCULAR | Status: AC
  Administered 2016-02-05: 25 ug via INTRAVENOUS
  Filled 2016-02-05: qty 2

## 2016-02-05 MED ORDER — SODIUM TETRADECYL SULFATE 3 % IV SOLN
10.0000 mL | Freq: Once | INTRAVENOUS | Status: AC
  Administered 2016-02-05: 10 mL via INTRAVENOUS
  Filled 2016-02-05: qty 10

## 2016-02-05 MED ORDER — FENTANYL CITRATE (PF) 100 MCG/2ML IJ SOLN
25.0000 ug | INTRAMUSCULAR | Status: DC | PRN
  Administered 2016-02-05 – 2016-02-07 (×13): 25 ug via INTRAVENOUS
  Filled 2016-02-05 (×12): qty 2

## 2016-02-05 MED ORDER — INSULIN ASPART 100 UNIT/ML ~~LOC~~ SOLN
0.0000 [IU] | SUBCUTANEOUS | Status: DC
  Administered 2016-02-05 – 2016-02-07 (×10): 2 [IU] via SUBCUTANEOUS

## 2016-02-05 MED ORDER — LACTULOSE 10 GM/15ML PO SOLN
30.0000 g | Freq: Three times a day (TID) | ORAL | Status: DC
  Administered 2016-02-05 – 2016-02-06 (×2): 30 g via ORAL
  Filled 2016-02-05 (×9): qty 45

## 2016-02-05 MED ORDER — LACTULOSE ENEMA
300.0000 mL | Freq: Every day | ORAL | Status: DC
  Filled 2016-02-05: qty 300

## 2016-02-05 MED ORDER — CHLORHEXIDINE GLUCONATE 0.12% ORAL RINSE (MEDLINE KIT)
15.0000 mL | Freq: Two times a day (BID) | OROMUCOSAL | Status: DC
  Administered 2016-02-05 (×2): 15 mL via OROMUCOSAL

## 2016-02-05 MED ORDER — ANTISEPTIC ORAL RINSE SOLUTION (CORINZ)
7.0000 mL | Freq: Four times a day (QID) | OROMUCOSAL | Status: DC
  Administered 2016-02-05: 7 mL via OROMUCOSAL

## 2016-02-05 MED ORDER — ROCURONIUM BROMIDE 100 MG/10ML IV SOLN
INTRAVENOUS | Status: DC | PRN
  Administered 2016-02-05: 50 mg via INTRAVENOUS

## 2016-02-05 MED ORDER — PROPOFOL 500 MG/50ML IV EMUL
INTRAVENOUS | Status: DC | PRN
  Administered 2016-02-05: 50 ug/kg/min via INTRAVENOUS

## 2016-02-05 MED ORDER — PROPOFOL 1000 MG/100ML IV EMUL
INTRAVENOUS | Status: AC
  Filled 2016-02-05: qty 100

## 2016-02-05 MED ORDER — LIPIODOL ULTRAFLUID INJECTION
10.0000 mL | Freq: Once | INTRAMUSCULAR | Status: AC
  Administered 2016-02-05: 10 mL via INTRA_ARTERIAL

## 2016-02-05 MED ORDER — SODIUM CHLORIDE 0.9 % IV SOLN
Freq: Once | INTRAVENOUS | Status: AC
  Administered 2016-02-05: 20:00:00 via INTRAVENOUS

## 2016-02-05 MED ORDER — PNEUMOCOCCAL VAC POLYVALENT 25 MCG/0.5ML IJ INJ
0.5000 mL | INJECTION | INTRAMUSCULAR | Status: AC
  Administered 2016-02-06: 0.5 mL via INTRAMUSCULAR
  Filled 2016-02-05: qty 0.5
  Filled 2016-02-05: qty 1

## 2016-02-05 NOTE — H&P (Signed)
PULMONARY / CRITICAL CARE MEDICINE   Name: Vincent Ochoa MRN: [id number] DOB: 02/16/1956    ADMISSION DATE:  02/04/2016 CONSULTATION DATE:  02/04/16  REFERRING MD:  [facility]  CHIEF COMPLAINT:  Hematemesis  HISTORY OF PRESENT ILLNESS:   Vincent Ochoa is a 60 y.o. male with PMH as outlined below including cirrhosis.  He presented to ED at [facility] on 03/15 with hematemesis that began suddenly the night prior around 2300.  He had one large episode of vomiting and on arrival to ED, he was hypotensive and looked critically ill.  Per his wife, he vomited so much of blood that it filled at least 2 small trash cans.  Blood was dark in color without any coffee grounds or clots.  Of note, pt is on plavix and ASA as outpatient which he takes for cardiac stent.  He had not had any similar symptoms in the past and denied heavy NSAID use.  He drinks minimally now but does apparently have hx of heavy ETOH use back in the 90's.  GI consulted 03/15 for hematemesis.  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  Also required PRBC transfusion.  Had bleeding that recurred AM 03/16.  Seen agaom by GI who reviewed case with IR and pt deemed to be a candidate for BRTO.  Pt was subsequently transferred to [facility] for procedure.  On arrival to [facility] ICU, he is awake but is somnolent and somewhat encephalopathic.  He is able to answer basic questions but is confused and unable to tell me where he is and why he is here; however, he is able to confirm that he has had hematemesis.  SUBJECTIVE: BRTO, vented  VITAL SIGNS: BP 92/66 mmHg  Pulse 87  Temp(Src) 98.2 F (36.8 C) (Oral)  Resp 24  Wt 135 kg (297 lb 9.9 oz)  SpO2 98%  HEMODYNAMICS:    VENTILATOR SETTINGS: Vent Mode:  [-] PRVC FiO2 (%):  [40 %-70 %] 40 % Set Rate:  [14 bmp-22 bmp] 22 bmp Vt Set:  [520 mL] 520 mL PEEP:  [5 cmH20] 5 cmH20 Plateau Pressure:  [11 cmH20-17 cmH20] 11 cmH20  INTAKE / OUTPUT: I/O last  3 completed shifts: In: 3339.8 [I.V.:2569.8; Blood:670; IV Piggyback:100] Out: 1150 [Urine:1100; Blood:50]   PHYSICAL EXAMINATION: General: Adult male, in NAD. Neuro: rass -5 HEENT: [place]/AT. PERRL, sclerae anicteric. Cardiovascular: RRR, no M/R/G.  Lungs: CTA Abdomen: BS x 4, soft, NT/ND, shift Musculoskeletal: No gross deformities, no edema.  Skin: Intact, warm, no rashes.  LABS:  BMET  Recent Labs Lab 02/04/16 1845 02/05/16 0111  NA 151* 150*  K 4.4 6.0*  CL 118* 117*  CO2 27 23  BUN 37* 38*  CREATININE 1.16 1.25*  GLUCOSE 136* 136*    Electrolytes  Recent Labs Lab 02/04/16 1845 02/05/16 0111  CALCIUM 8.0* 7.7*  MG 2.0 2.2  PHOS 3.1 4.9*    CBC  Recent Labs Lab 02/04/16 1845 02/05/16 0400  WBC 12.2* 11.0*  HGB 7.3* 8.6*  HCT 22.0* 24.9*  PLT 87* 63*    Coag's  Recent Labs Lab 02/04/16 1840 02/05/16 0111  INR 1.48 1.45    Sepsis Markers  Recent Labs Lab 02/04/16 1850 02/05/16 0133  LATICACIDVEN 2.3* 2.0    ABG  Recent Labs Lab 02/05/16 0145 02/05/16 0353  PHART 7.173* 7.331*  PCO2ART 70.9* 44.5  PO2ART 87.0 104*    Liver Enzymes  Recent Labs Lab 02/04/16 1845  AST 54*  ALT 51  ALKPHOS 54  BILITOT 1.5*  ALBUMIN 2.6*    Cardiac Enzymes  Recent Labs Lab 02/04/16 1845 02/05/16 0111  TROPONINI 0.03 <0.03    Glucose  Recent Labs Lab 02/04/16 1732 02/05/16 0136 02/05/16 0318 02/05/16 0842  GLUCAP 115* 137* 131* 121*    Imaging Portable Chest Xray  02/05/2016  CLINICAL DATA:  Encounter for intubation EXAM: PORTABLE CHEST 1 VIEW COMPARISON:  01/01/2016 FINDINGS: Endotracheal tube tip is just below the clavicular heads. Vascular pedicle widening which is newly seen. Normal heart size for technique. There is a hazy right basilar opacity. Streaky left basilar opacity suggesting atelectasis. No evidence of pneumothorax. No pulmonary edema. Newly seen embolic agent in the left upper quadrant. IMPRESSION: 1.  Endotracheal tube in good position. 2. Right basilar opacity is likely combination of atelectasis and layering effusion. 3. Left basilar atelectasis. Electronically Signed   By: Monte Fantasia M.D.   On: 02/05/2016 02:06     STUDIES:  EGD 03/15 > gastric varices but no active bleeding. BRTO 3/16  CULTURES: None.  ANTIBIOTICS: Ceftriaxone 03/15 >  SIGNIFICANT EVENTS: 03/15 > admitted to [facility] with hematemesis. 03/16 > transferred to [facility] for BRTO, vented  LINES/TUBES: 3/16 ett>>>  DISCUSSION: 60 y.o. M with cirrhosis, admitted to [facility] 03/15 for hematemesis.  Had anemia requiring transfusion and EGD which did not show active bleed.  On 03/16, he was transferred to [facility] for BRTO procedure.  ASSESSMENT / PLAN:  GASTROINTESTINAL A:   Hematemesis - unclear etiology; varices vs PUD. Cirrhosis. GI prophylaxis. Nutrition. P:   GI called Continue octreotide, PPI infusions. Continue ceftriaxone per ID section, consider 7 days NPO  HEMATOLOGIC A:   Acute blood loss anemia. Hx thrombocytopenia. VTE Prophylaxis. P:  SCD's. Cbc q12h   PULMONARY A: Acute hypoxic respiratory failure. Hx OSA - on CPAP. Tobacco use disorder. Effusion rt base P:   Even to neg balance Wean NOW, cpap5 ps5, goal 30 min, assess rsbi  CARDIOVASCULAR A:  Hx CAD s/p PCI (on plavix, ASA), HLD, PAF. P:  Monitor hemodynamics. Hold outpatient plavix, ASA, atorvastatin, furosemide, imdur, toprol-xl, nitro.  RENAL A:   Rt effusion likely hypernatremia P:   Repeat bmet for K Dc saline Add d5w at 50 cc/hr.  INFECTIOUS A:   SBP prophylaxis. P:   Abx as above (ceftriaxone), add stop date 5 days  ENDOCRINE A:   At risk hyperglycemia on octreotide P:   cbg  NEUROLOGIC A:   Acute metabolic encephalopathy. Hx back pain. High risk hepatic encpeph P:   Monitor clinically. NO ROLE TO DRAW ammonia Hold outpatient bupropion, cyclobenzaprine, norco. Lactulose enema daily Prop with  wua   Family updated: None.  Interdisciplinary Family Meeting v Palliative Care Meeting:  Due by: 03/23.  CC time: 35 minutes.  Lavon Paganini. Titus Mould, MD, [place] Pgr: [facility]

## 2016-02-05 NOTE — Progress Notes (Signed)
eLink Physician-Brief Progress Note Patient Name: Vincent Ochoa DOB: [birth year] MRN: [id number]   Date of Service  02/05/2016  HPI/Events of Note  Per IR: Can take meds by mouth  eICU Interventions  Change lactulose to oral     Intervention Category Minor Interventions: Routine modifications to care plan (e.g. PRN medications for pain, fever)  Simonne Maffucci 02/05/2016, 5:53 PM

## 2016-02-05 NOTE — Transfer of Care (Signed)
Immediate Anesthesia Transfer of Care Note  Patient: Vincent Ochoa  Procedure(s) Performed: Procedure(s): RADIOLOGY WITH ANESTHESIA (N/A)  Patient Location: PACU  Anesthesia Type:General  Level of Consciousness: Patient remains intubated per anesthesia plan  Airway & Oxygen Therapy: Patient remains intubated per anesthesia plan and Patient placed on Ventilator (see vital sign flow sheet for setting)  Post-op Assessment: Report given to RN and Post -op Vital signs reviewed and stable  Post vital signs: Reviewed and stable  Last Vitals:  Filed Vitals:   02/04/16 2007 02/04/16 2100  BP:  136/72  Pulse:  104  Temp: 36.8 C   Resp:  15    Complications: No apparent anesthesia complications

## 2016-02-05 NOTE — Procedures (Signed)
Extubation Procedure Note  Patient Details:   Name: Vincent Ochoa DOB: [birth year] MRN: [id number]   Airway Documentation:     Evaluation  O2 sats: stable throughout Complications: No apparent complications Patient did tolerate procedure well. Bilateral Breath Sounds: Clear, Diminished   Yes   Extubated to 4l [place].  No complications noted.  Cuff leak positive prior to extubation.  No stridor noted. Will continue to monitor.  Phillis Knack [facility] 02/05/2016, 10:29 AM

## 2016-02-05 NOTE — Progress Notes (Signed)
UR Completed. Wyoma Genson, RN, BSN.  [phone] 

## 2016-02-05 NOTE — Progress Notes (Signed)
UR Completed. Cort Dragoo, RN, BSN.  [phone] 

## 2016-02-05 NOTE — Progress Notes (Signed)
Approx 1515 called IR for post procedure orders (none were placed), spoke with Lennette Bihari PA. PA said orders would be put in. 1640 no post procedure orders. Elink MD notified and tried to reach IR. Approx 1720 RN got a hold of on-call radiologist. MD said it was ok to advance diet as tolerated and it was ok to end the patient's bedrest

## 2016-02-05 NOTE — Anesthesia Postprocedure Evaluation (Signed)
Anesthesia Post Note  Patient: Vincent Ochoa  Procedure(s) Performed: Procedure(s) (LRB): RADIOLOGY WITH ANESTHESIA (N/A)  Patient location during evaluation: ICU Anesthesia Type: General Level of consciousness: patient remains intubated per anesthesia plan Pain management: pain level controlled Respiratory status: patient remains intubated per anesthesia plan Cardiovascular status: stable Anesthetic complications: no    Last Vitals:  Filed Vitals:   02/04/16 2007 02/04/16 2100  BP:  136/72  Pulse:  104  Temp: 36.8 C   Resp:  15    Last Pain: There were no vitals filed for this visit.               EDWARDS,Fill[place] Bynum

## 2016-02-05 NOTE — Procedures (Signed)
Removal of venous occlusion balloon under fluoro R fem venous sheath removal No complication No blood loss. See complete dictation in [facility].

## 2016-02-05 NOTE — Progress Notes (Signed)
eLink Physician-Brief Progress Note Patient Name: Vincent Ochoa DOB: [birth year] MRN: [id number]   Date of Service  02/05/2016  HPI/Events of Note  No active bleeding but CBC shows thrombocytopenia is worse and Hgb down  eICU Interventions  Transfuse 1 U PRBC and 1 U PLT     Intervention Category Intermediate Interventions: Bleeding - evaluation and treatment with blood products  Simonne Maffucci 02/05/2016, 8:17 PM

## 2016-02-05 NOTE — Progress Notes (Signed)
CRITICAL VALUE ALERT  Critical value received:  ABG Ph 7.173, CO2 70.9  Date of notification:  02/05/16  Time of notification:  0145  Critical value read back:Yes.    Nurse who received alert:  Colletta Maryland, RN  MD notified (1st page):  [place]  Time of first page:  0145  MD notified (2nd page):  Time of second page:  Responding MD:  [place]  Time MD responded:  [phone]

## 2016-02-05 NOTE — Care Management Note (Signed)
Case Management Note  Patient Details  Name: Vincent Ochoa MRN: [id number] Date of Birth: February 22, 1956  Subjective/Objective:    Pt admitted with GI bleed                Action/Plan:  PTA - Pt independent with wife.  CM will continue to monitor for disposition needs   Expected Discharge Date:                  Expected Discharge Plan:  [place]  In-House Referral:     Discharge planning Services  CM Consult  Post Acute Care Choice:    Choice offered to:     DME Arranged:    DME Agency:     HH Arranged:    HH Agency:     Status of Service:  In process, will continue to follow  Medicare Important Message Given:    Date Medicare IM Given:    Medicare IM give by:    Date Additional Medicare IM Given:    Additional Medicare Important Message give by:     If discussed at [place] of Stay Meetings, dates discussed:    Additional Comments:  Maryclare Labrador, RN 02/05/2016, 10:18 AM

## 2016-02-05 NOTE — Progress Notes (Signed)
eLink Physician-Brief Progress Note Patient Name: ZIARE BEFORT DOB: May 05, 1956 MRN: [id number]   Date of Service  02/05/2016  HPI/Events of Note  Resp acidosis on vent with pH 7.17/70.9/87/25  eICU Interventions  Increaser RR on vent from 14 to 22 Repeat ABG in 2  hours     Intervention Category Major Interventions: Acid-Base disturbance - evaluation and management  Jihad Brownlow 02/05/2016, 2:03 AM

## 2016-02-05 NOTE — Procedures (Signed)
Technically successful BRTO procedure for actively bleeding gastric varices.  Occlusion balloon left in place overnight for embolic/sclerosant control purposes. Pt will return to IR suite in AM for fluoroscopic guided removal of occlusion balloon and sheath.  Keep Right leg straight while catheter in place.  EBL: None No immediate post procedural complications.  Ronny Bacon, MD Pager #: [phone]

## 2016-02-06 ENCOUNTER — Inpatient Hospital Stay (HOSPITAL_COMMUNITY): Payer: Medicaid Other

## 2016-02-06 LAB — CBC WITH DIFFERENTIAL/PLATELET
BASOS ABS: 0.1 10*3/uL (ref 0.0–0.1)
Basophils Relative: 1 %
EOS ABS: 0.3 10*3/uL (ref 0.0–0.7)
Eosinophils Relative: 3 %
HEMATOCRIT: 24.8 % — AB (ref 39.0–52.0)
Hemoglobin: 8.6 g/dL — ABNORMAL LOW (ref 13.0–17.0)
LYMPHS ABS: 2 10*3/uL (ref 0.7–4.0)
Lymphocytes Relative: 22 %
MCH: 31.5 pg (ref 26.0–34.0)
MCHC: 34.7 g/dL (ref 30.0–36.0)
MCV: 90.8 fL (ref 78.0–100.0)
MONO ABS: 1.1 10*3/uL — AB (ref 0.1–1.0)
Monocytes Relative: 12 %
NEUTROS ABS: 5.5 10*3/uL (ref 1.7–7.7)
Neutrophils Relative %: 62 %
PLATELETS: 63 10*3/uL — AB (ref 150–400)
RBC: 2.73 MIL/uL — ABNORMAL LOW (ref 4.22–5.81)
RDW: 18 % — AB (ref 11.5–15.5)
WBC: 9 10*3/uL (ref 4.0–10.5)

## 2016-02-06 LAB — GLUCOSE, CAPILLARY
GLUCOSE-CAPILLARY: 103 mg/dL — AB (ref 65–99)
GLUCOSE-CAPILLARY: 112 mg/dL — AB (ref 65–99)
GLUCOSE-CAPILLARY: 133 mg/dL — AB (ref 65–99)
GLUCOSE-CAPILLARY: 144 mg/dL — AB (ref 65–99)
Glucose-Capillary: 120 mg/dL — ABNORMAL HIGH (ref 65–99)
Glucose-Capillary: 118 mg/dL — ABNORMAL HIGH (ref 65–99)

## 2016-02-06 LAB — CBC
HCT: 25.9 % — ABNORMAL LOW (ref 39.0–52.0)
HCT: 25.3 % — ABNORMAL LOW (ref 39.0–52.0)
HEMOGLOBIN: 8.2 g/dL — AB (ref 13.0–17.0)
Hemoglobin: 8.6 g/dL — ABNORMAL LOW (ref 13.0–17.0)
MCH: 30.2 pg (ref 26.0–34.0)
MCH: 30.4 pg (ref 26.0–34.0)
MCHC: 33.2 g/dL (ref 30.0–36.0)
MCHC: 32.4 g/dL (ref 30.0–36.0)
MCV: 90.9 fL (ref 78.0–100.0)
MCV: 93.7 fL (ref 78.0–100.0)
PLATELETS: 52 10*3/uL — AB (ref 150–400)
PLATELETS: 70 10*3/uL — AB (ref 150–400)
RBC: 2.7 MIL/uL — AB (ref 4.22–5.81)
RBC: 2.85 MIL/uL — ABNORMAL LOW (ref 4.22–5.81)
RDW: 17.8 % — ABNORMAL HIGH (ref 11.5–15.5)
RDW: 17.6 % — AB (ref 11.5–15.5)
WBC: 7.9 10*3/uL (ref 4.0–10.5)
WBC: 12.5 10*3/uL — AB (ref 4.0–10.5)

## 2016-02-06 LAB — COMPREHENSIVE METABOLIC PANEL
ALBUMIN: 2.7 g/dL — AB (ref 3.5–5.0)
ALK PHOS: 61 U/L (ref 38–126)
ALT: 58 U/L (ref 17–63)
AST: 90 U/L — AB (ref 15–41)
Anion gap: 7 (ref 5–15)
BILIRUBIN TOTAL: 1.8 mg/dL — AB (ref 0.3–1.2)
BUN: 36 mg/dL — AB (ref 6–20)
CALCIUM: 7.6 mg/dL — AB (ref 8.9–10.3)
CO2: 22 mmol/L (ref 22–32)
CREATININE: 1.24 mg/dL (ref 0.61–1.24)
Chloride: 117 mmol/L — ABNORMAL HIGH (ref 101–111)
GFR calc Af Amer: >60 mL/min (ref >60)
GLUCOSE: 126 mg/dL — AB (ref 65–99)
Potassium: 4.9 mmol/L (ref 3.5–5.1)
Sodium: 146 mmol/L — ABNORMAL HIGH (ref 135–145)
TOTAL PROTEIN: 5.3 g/dL — AB (ref 6.5–8.1)

## 2016-02-06 LAB — PREPARE PLATELET PHERESIS: UNIT DIVISION: 0

## 2016-02-06 MED ORDER — MIDAZOLAM HCL 2 MG/2ML IJ SOLN
INTRAMUSCULAR | Status: AC
  Filled 2016-02-06: qty 2

## 2016-02-06 MED ORDER — MIDAZOLAM HCL 2 MG/2ML IJ SOLN
2.0000 mg | INTRAMUSCULAR | Status: DC | PRN
  Administered 2016-02-06 (×2): 2 mg via INTRAVENOUS
  Filled 2016-02-06: qty 2

## 2016-02-06 MED ORDER — CETYLPYRIDINIUM CHLORIDE 0.05 % MT LIQD
7.0000 mL | Freq: Two times a day (BID) | OROMUCOSAL | Status: DC
  Administered 2016-02-06 – 2016-02-09 (×6): 7 mL via OROMUCOSAL

## 2016-02-06 NOTE — Progress Notes (Signed)
PULMONARY / CRITICAL CARE MEDICINE   Name: Vincent Ochoa MRN: [id number] DOB: November 17, 1956    ADMISSION DATE:  02/04/2016 CONSULTATION DATE:  02/04/16  REFERRING MD:  [facility]  CHIEF COMPLAINT:  Hematemesis  HISTORY OF PRESENT ILLNESS:   Vincent Ochoa is a 60 y.o. male with PMH as outlined below including cirrhosis.  He presented to ED at [facility] on 03/15 with hematemesis that began suddenly the night prior around 2300.  He had one large episode of vomiting and on arrival to ED, he was hypotensive and looked critically ill.  Per his wife, he vomited so much of blood that it filled at least 2 small trash cans.  Blood was dark in color without any coffee grounds or clots.  Of note, pt is on plavix and ASA as outpatient which he takes for cardiac stent.  He had not had any similar symptoms in the past and denied heavy NSAID use.  He drinks minimally now but does apparently have hx of heavy ETOH use back in the 90's.  GI consulted 03/15 for hematemesis.  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  Also required PRBC transfusion.  Had bleeding that recurred AM 03/16.  Seen agaom by GI who reviewed case with IR and pt deemed to be a candidate for BRTO.  Pt was subsequently transferred to [facility] for procedure.  On arrival to [facility] ICU, he is awake but is somnolent and somewhat encephalopathic.  He is able to answer basic questions but is confused and unable to tell me where he is and why he is here; however, he is able to confirm that he has had hematemesis.  SUBJECTIVE: BRTO, vented  VITAL SIGNS: BP 122/71 mmHg  Pulse 77  Temp(Src) 98.1 F (36.7 C) (Oral)  Resp 11  Wt 307 lb 1.6 oz (139.3 kg)  SpO2 96%  HEMODYNAMICS:    VENTILATOR SETTINGS:    INTAKE / OUTPUT: I/O last 3 completed shifts: In: 6482.9 [I.V.:5074.6; Blood:1208.4; IV Piggyback:200] Out: 1300 [Urine:1250; Blood:50]   PHYSICAL EXAMINATION: General: Adult male, in NAD.Sitting in  chair but still sleepy Neuro: Awake and follows commands. Rass 0 HEENT: [place]/AT. PERRL, sclerae anicteric. Cardiovascular: RRR, no M/R/G.  Lungs: CTA, decreased bs bases Abdomen: BS x 4, soft, NT/ND, shift Musculoskeletal: No gross deformities, no edema.  Skin: Intact, warm, no rashes.  LABS:  BMET  Recent Labs Lab 02/05/16 0111 02/05/16 1702 02/06/16 0735  NA 150* 151* 146*  K 6.0* 4.8 4.9  CL 117* 118* 117*  CO2 23 23 22   BUN 38* 44* 36*  CREATININE 1.25* 1.42* 1.24  GLUCOSE 136* 139* 126*    Electrolytes  Recent Labs Lab 02/04/16 1845 02/05/16 0111 02/05/16 1702 02/06/16 0735  CALCIUM 8.0* 7.7* 7.7* 7.6*  MG 2.0 2.2  --   --   PHOS 3.1 4.9*  --   --     CBC  Recent Labs Lab 02/05/16 1914 02/06/16 0051 02/06/16 0735  WBC 8.1 12.5* 9.0  HGB 6.8* 8.6* 8.6*  HCT 20.0* 25.9* 24.8*  PLT 20* 70* 63*    Coag's  Recent Labs Lab 02/04/16 1840 02/05/16 0111  INR 1.48 1.45    Sepsis Markers  Recent Labs Lab 02/04/16 1850 02/05/16 0133  LATICACIDVEN 2.3* 2.0    ABG  Recent Labs Lab 02/05/16 0145 02/05/16 0353  PHART 7.173* 7.331*  PCO2ART 70.9* 44.5  PO2ART 87.0 104*    Liver Enzymes  Recent Labs Lab  02/04/16 1845 02/06/16 0735  AST 54* 90*  ALT 51 58  ALKPHOS 54 61  BILITOT 1.5* 1.8*  ALBUMIN 2.6* 2.7*    Cardiac Enzymes  Recent Labs Lab 02/04/16 1845 02/05/16 0111 02/05/16 1702  TROPONINI 0.03 <0.03 0.05*    Glucose  Recent Labs Lab 02/05/16 1201 02/05/16 1545 02/05/16 1910 02/05/16 2345 02/06/16 0319 02/06/16 0836  GLUCAP 133* 128* 138* 103* 133* 120*    Imaging Ir Fluoro Rm 30-60 Min  02/05/2016  CLINICAL DATA:  Upper GI bleed, status post gastric varicenal sclerosant embolization. Occlusion balloon was left intact and inflated overnight postprocedure. No evidence of continued upper GI bleed. EXAM: IR FLOURO RM 0-60 MIN COMPARISON:  Previous day's exam TECHNIQUE: The right femoral venous sheath and  catheter were prepped and draped in usual sterile fashion. Maximal barrier sterile technique was utilized including caps, mask, sterile gowns, sterile gloves, sterile drape, hand hygiene and skin antiseptic. Fluoroscopic inspection of the gastric varices showed stable appearance of the opacified sclerosis a material. The occlusion balloon remains inflated. Under fluoroscopic observation, the occlusion balloon was slowly deflated. There is no change in orientation or position of the opacified sclerosant material. The occlusion balloon catheter was retracted. The sclerosant material was stable in appearance. The venous sheath was removed and hemostasis achieved at the site. The patient tolerated the procedure well. IMPRESSION: 1. Stable position orientation of gastric variceal sclerosant material. The occlusion balloon and sheath were removed without incident. Electronically Signed   By: Lucrezia Europe M.D.   On: 02/05/2016 13:21   Dg Chest Port 1 View  02/06/2016  CLINICAL DATA:  Acute respiratory failure EXAM: PORTABLE CHEST 1 VIEW COMPARISON:  Chest radiograph from one day prior. FINDINGS: Interval extubation. Stable cardiomediastinal silhouette with normal heart size. No pneumothorax. No pleural effusion. No pulmonary edema. Curvilinear bibasilar lung opacities, significantly improved. IMPRESSION: Bibasilar curvilinear lung opacities, significantly improved, favor atelectasis. Electronically Signed   By: Ilona Sorrel M.D.   On: 02/06/2016 10:00     STUDIES:  EGD 03/15 > gastric varices but no active bleeding. BRTO 3/16  CULTURES: None.  ANTIBIOTICS: Ceftriaxone 03/15 >  SIGNIFICANT EVENTS: 03/15 > admitted to [facility] with hematemesis. 03/16 > transferred to [facility] for BRTO, vented  LINES/TUBES: 3/16 ett>>>3/17  DISCUSSION: 60 y.o. M with cirrhosis, admitted to [facility] 03/15 for hematemesis.  Had anemia requiring transfusion and EGD which did not show active bleed.  On 03/16, he was transferred to [facility] for  BRTO procedure.  ASSESSMENT / PLAN:  GASTROINTESTINAL A:   Hematemesis - unclear etiology; varices vs PUD. Cirrhosis. GI prophylaxis. Nutrition. P:   GI called Consider dc  octreotide, PPI infusions. Continue ceftriaxone per ID section, consider 7 days NPO  HEMATOLOGIC  Recent Labs  02/06/16 0051 02/06/16 0735  HGB 8.6* 8.6*    A:   Acute blood loss anemia. Hx thrombocytopenia. plts 20->63 post plt infusion VTE Prophylaxis. P:  SCD's. Cbc q12h  Plts and prbc given 3/17  PULMONARY A: Acute hypoxic respiratory failure. Hx OSA - on CPAP. Tobacco use disorder. Effusion rt base P:   Even to neg balance Nocturnal cpap   CARDIOVASCULAR A:  Hx CAD s/p PCI (on plavix, ASA), HLD, PAF. P:  Monitor hemodynamics. Hold outpatient plavix, ASA, atorvastatin, furosemide, imdur, toprol-xl, nitro.  RENAL Lab Results  Component Value Date   CREATININE 1.24 02/06/2016   CREATININE 1.42* 02/05/2016   CREATININE 1.25* 02/05/2016   CREATININE 1.09 07/29/2011    Recent Labs Lab 02/05/16 0111  02/05/16 1702 02/06/16 0735  K 6.0* 4.8 4.9    Recent Labs Lab 02/05/16 0111 02/05/16 1702 02/06/16 0735  NA 150* 151* 146*     A:   Rt effusion likely Hypernatremia, improving P:   Dc saline Add d5w at 50 cc/hr.  INFECTIOUS A:   SBP prophylaxis. P:   Abx as above (ceftriaxone), add stop date 5 days  ENDOCRINE CBG (last 3)   Recent Labs  02/05/16 2345 02/06/16 0319 02/06/16 0836  GLUCAP 103* 133* 120*     A:   At risk hyperglycemia on octreotide P:   cbg  NEUROLOGIC A:   Acute metabolic encephalopathy. Resolved Hx back pain. High risk hepatic encpeph P:   Monitor clinically. Hold outpatient bupropion, cyclobenzaprine, norco. Lactulose enema daily    Family updated: None.  Interdisciplinary Family Meeting v Palliative Care Meeting:  Due by: 03/23.  CC time:  Keep in ICU for now. May be able to go to SDU and triad in 24-48 hrs if  continues to improve.  Richardson Landry Minor ACNP Maryanna Shape PCCM Pager [phone] till 3 pm If no answer page [phone] 02/06/2016, 10:33 AM  Attending Note:  I have examined patient, reviewed labs, studies and notes. I have discussed the case with S Minor, and I agree with the data and plans as amended above. S/p IR intervention for hematemesis. Appears to have achieved hemostasis. On my eval this am he was on CPAPm sleeping. He has had some lethargy and confusion today. We will keep him in ICU for monitoring one more day.   Baltazar Apo, MD, PhD 02/06/2016, 6:57 PM [facility] [phone] or if no answer [phone]

## 2016-02-06 NOTE — Progress Notes (Signed)
Reported to dr. Ardis Hughs that the pt had a large bloody bm. Pt already received 1u prbc 1 hour before the bm. Plan is to assess the upcoming cbc and monitor

## 2016-02-06 NOTE — Progress Notes (Signed)
Referring Physician(s): Tre Nicoletta Dress ([facility])  Supervising Physician: Markus Daft  Chief Complaint:  F/U after BRTO  Subjective:  Vincent Ochoa is very grumpy this morning.   Probably still somewhat encephalopathic. Makes sense at times, then he seems confused and mumbles.  C/o being thirsty  Allergies: Review of patient's allergies indicates no known allergies.  Medications: Prior to Admission medications   Medication Sig Start Date End Date Taking? Authorizing Provider  aspirin 81 MG tablet Take 1 tablet (81 mg total) by mouth daily. 10/31/14   Dayna N Dunn, PA-C  atorvastatin (LIPITOR) 10 MG tablet Take 1 tablet (10 mg total) by mouth every evening. Patient not taking: Reported on 01/01/2016 12/16/14   Sherren Mocha, MD  buPROPion (WELLBUTRIN XL) 150 MG 24 hr tablet Take 150 mg by mouth daily.    Historical Provider, MD  clopidogrel (PLAVIX) 75 MG tablet Take 1 tablet (75 mg total) by mouth daily. 06/10/15   Rogelia Mire, NP  cyclobenzaprine (FLEXERIL) 10 MG tablet Take 1 tablet (10 mg total) by mouth 3 (three) times daily as needed for muscle spasms. Patient not taking: Reported on 01/01/2016 09/14/15   Rosemarie Ax, MD  furosemide (LASIX) 20 MG tablet Take 1 tablet (20 mg total) by mouth daily. 02/03/16   Rosemarie Ax, MD  HYDROcodone-acetaminophen (NORCO/VICODIN) 5-325 MG tablet Take 1 tablet by mouth every 6 (six) hours as needed for moderate pain or severe pain. 02/03/16   Rosemarie Ax, MD  isosorbide mononitrate (IMDUR) 30 MG 24 hr tablet Take 1 tablet (30 mg total) by mouth daily. 02/03/16   Rosemarie Ax, MD  metoprolol succinate (TOPROL-XL) 25 MG 24 hr tablet Take 0.5 tablets (12.5 mg total) by mouth daily. 02/26/15   Sherren Mocha, MD  naproxen (NAPROSYN) 500 MG tablet Take 1 tablet (500 mg total) by mouth 2 (two) times daily with a meal. Patient not taking: Reported on 01/01/2016 10/12/15   Rosemarie Ax, MD  nitroGLYCERIN (NITROSTAT) 0.4 MG SL  tablet Place 1 tablet (0.4 mg total) under the tongue every 5 (five) minutes as needed for chest pain (up to 3 doses). 10/31/14   Dayna N Dunn, PA-C  pantoprazole (PROTONIX) 40 MG tablet Take 1 tablet (40 mg total) by mouth daily. Patient not taking: Reported on 01/01/2016 10/31/14   Dayna N Dunn, PA-C     Vital Signs: BP 122/71 mmHg  Pulse 77  Temp(Src) 98.1 F (36.7 C) (Oral)  Resp 11  Wt 307 lb 1.6 oz (139.3 kg)  SpO2 96%  Physical Exam  Awake and alert Seems to still be encephalopathic at times. Heart RRR Lungs clear Right groin stick ok. No hematoma.  Imaging: Ir Angiogram Selective Each Additional Vessel  02/05/2016  CLINICAL DATA:  History of alcoholic cirrhosis, now with bleeding gastric varices. Patient presents for BRT0 of gastric varices. EXAM: 1. ULTRASOUND GUIDANCE FOR VENOUS ACCESS 2. LEFT RENAL VENOGRAM 3. SELECTIVE GASTRIC VARICEAL VENOGRAM AND PERCUTANEOUS SCLEROSANT EMBOLIZATION COMPARISON:  BRTO protocol CT- 01/24/2016 MEDICATIONS: None ANESTHESIA/SEDATION: General - as administered by the Anesthesia department CONTRAST:  100 cc Omnipaque 300 FLUOROSCOPY TIME:  Fluoroscopy Time: 33 minutes 54 seconds (3,624 mGy). COMPLICATIONS: None immediate. PROCEDURE: Informed written consent was obtained from the patient's wife after a thorough discussion of the procedural risks, benefits and alternatives. All questions were addressed. Maximal Sterile Barrier Technique was utilized including caps, mask, sterile gowns, sterile gloves, sterile drape, hand hygiene and skin antiseptic. A timeout was performed prior  to the initiation of the procedure. The right groin was prepped and draped in usual sterile fashion. Under direct ultrasound guidance, the right common femoral vein was accessed with a micropuncture kit. An ultrasound image was saved for procedural documentation purposes. Over a Bentson wire, the track was dilated allowing for placement of a 10 French TIPS sheath to the level of  the mid IVC under intermittent fluoroscopic guidance. A Cobra catheter was utilized to select the left renal vein and a left renal venogram was performed. A Rosen wire was advanced into the caudal pole of the left kidney as the access sheath was advanced to the origin of left renal vein. Next, a 4 French angled glide catheter was cannulated within the sheath, next to the [place] wire, and with the use of a regular glidewire, was utilized to select the outflow origin of the hypertrophied gastric varix. The Rosen wire was then removed and over a coiled Amplatz wire, the vascular sheath was advanced into the outflow of the splenorenal shunt. Contrast injection confirmed appropriate positioning. Initially a Merci balloon, and ultimately, a 9 cm [place] scientific occlusion balloon was inflated and pulled caudal to the outflow confluence of the splenorenal shunt. With the outflow of the shunt occluded, multiple contrast and CO2 venograms were performed in various obliquities to identify the anatomy of the hypertrophied gastric varices. Next, a high-flow micro catheter was advanced through the occlusion balloon into the caudal aspect of the markedly hypertrophied shunt. Multiple CO2 an contrast venograms were again performed to identify the anatomy of the hypertrophied gastric varices and to identify the inflow of a gastric varices. At this time, sclerosant was administered (in a mixture of 2 cc of Lipiodol, 4 cc of STS and 6 cc of air) under fluoroscopic guidance. Multiple spot fluoroscopic and radiographic images were obtained during the sclerosant administration. Given the patulous size of the outflow of the splenorenal shunt, the decision was made to leave the occlusion balloon in place overnight. The vascular sheath and occlusion balloon were secured at the right groin entrance site with interrupted suture and Tegaderm. Dressings were placed. The patient tolerated the procedure well and remained hemodynamically stable  throughout the procedure. FINDINGS: Initial left venogram demonstrates the outflow of markedly hypertrophied splenorenal shunt as was demonstrated on preceding BRTO protocol abdominal CT. Selective venograms demonstrates a markedly hypertrophied splenorenal shunt draining multiple markedly enlarged gastric varices. CO2 venogram adequately demonstrates the inflow of the gastric varices from the splenic vein. Note was made a solitary tiny right inferior phrenic escape vein. Following fluoroscopic guided sclerosant administration, the sclerosant appears to be contained within near the entirety of the gastric varices and splenorenal shunt without evidence of spillage into the splenic or portal veins and without evidence of extravasation. IMPRESSION: Technically successful BRTO procedure for bleeding gastric varices. PLAN: - The patient will return to the interventional radiology suite tomorrow morning for fluoroscopic guided removal of occlusion balloon an vascular sheath. - Postprocedural BRTO protocol abdominal CT will be obtained on the morning of 3/18. Electronically Signed   By: Sandi Mariscal M.D.   On: 02/05/2016 10:50   Ir Venogram Renal Uni Left  02/05/2016  CLINICAL DATA:  History of alcoholic cirrhosis, now with bleeding gastric varices. Patient presents for BRT0 of gastric varices. EXAM: 1. ULTRASOUND GUIDANCE FOR VENOUS ACCESS 2. LEFT RENAL VENOGRAM 3. SELECTIVE GASTRIC VARICEAL VENOGRAM AND PERCUTANEOUS SCLEROSANT EMBOLIZATION COMPARISON:  BRTO protocol CT- 01/24/2016 MEDICATIONS: None ANESTHESIA/SEDATION: General - as administered by the Anesthesia department CONTRAST:  100 cc Omnipaque 300 FLUOROSCOPY TIME:  Fluoroscopy Time: 33 minutes 54 seconds (3,624 mGy). COMPLICATIONS: None immediate. PROCEDURE: Informed written consent was obtained from the patient's wife after a thorough discussion of the procedural risks, benefits and alternatives. All questions were addressed. Maximal Sterile Barrier Technique  was utilized including caps, mask, sterile gowns, sterile gloves, sterile drape, hand hygiene and skin antiseptic. A timeout was performed prior to the initiation of the procedure. The right groin was prepped and draped in usual sterile fashion. Under direct ultrasound guidance, the right common femoral vein was accessed with a micropuncture kit. An ultrasound image was saved for procedural documentation purposes. Over a Bentson wire, the track was dilated allowing for placement of a 10 French TIPS sheath to the level of the mid IVC under intermittent fluoroscopic guidance. A Cobra catheter was utilized to select the left renal vein and a left renal venogram was performed. A Rosen wire was advanced into the caudal pole of the left kidney as the access sheath was advanced to the origin of left renal vein. Next, a 4 French angled glide catheter was cannulated within the sheath, next to the G. [employer]. Garci[place]a wire, and with the use of a regular glidewire, was utilized to select the outflow origin of the hypertrophied gastric varix. The Rosen wire was then removed and over a coiled Amplatz wire, the vascular sheath was advanced into the outflow of the splenorenal shunt. Contrast injection confirmed appropriate positioning. Initially a Merci balloon, and ultimately, a 58 cm [place] scientific occlusion balloon was inflated and pulled caudal to the outflow confluence of the splenorenal shunt. With the outflow of the shunt occluded, multiple contrast and CO2 venograms were performed in various obliquities to identify the anatomy of the hypertrophied gastric varices. Next, a high-flow micro catheter was advanced through the occlusion balloon into the caudal aspect of the markedly hypertrophied shunt. Multiple CO2 an contrast venograms were again performed to identify the anatomy of the hypertrophied gastric varices and to identify the inflow of a gastric varices. At this time, sclerosant was administered (in a mixture of 2 cc of  Lipiodol, 4 cc of STS and 6 cc of air) under fluoroscopic guidance. Multiple spot fluoroscopic and radiographic images were obtained during the sclerosant administration. Given the patulous size of the outflow of the splenorenal shunt, the decision was made to leave the occlusion balloon in place overnight. The vascular sheath and occlusion balloon were secured at the right groin entrance site with interrupted suture and Tegaderm. Dressings were placed. The patient tolerated the procedure well and remained hemodynamically stable throughout the procedure. FINDINGS: Initial left venogram demonstrates the outflow of markedly hypertrophied splenorenal shunt as was demonstrated on preceding BRTO protocol abdominal CT. Selective venograms demonstrates a markedly hypertrophied splenorenal shunt draining multiple markedly enlarged gastric varices. CO2 venogram adequately demonstrates the inflow of the gastric varices from the splenic vein. Note was made a solitary tiny right inferior phrenic escape vein. Following fluoroscopic guided sclerosant administration, the sclerosant appears to be contained within near the entirety of the gastric varices and splenorenal shunt without evidence of spillage into the splenic or portal veins and without evidence of extravasation. IMPRESSION: Technically successful BRTO procedure for bleeding gastric varices. PLAN: - The patient will return to the interventional radiology suite tomorrow morning for fluoroscopic guided removal of occlusion balloon an vascular sheath. - Postprocedural BRTO protocol abdominal CT will be obtained on the morning of 3/18. Electronically Signed   By: Sandi Mariscal M.D.   On: 02/05/2016 10:50  Ir Angiogram Follow Up Study  02/05/2016  CLINICAL DATA:  History of alcoholic cirrhosis, now with bleeding gastric varices. Patient presents for BRT0 of gastric varices. EXAM: 1. ULTRASOUND GUIDANCE FOR VENOUS ACCESS 2. LEFT RENAL VENOGRAM 3. SELECTIVE GASTRIC VARICEAL  VENOGRAM AND PERCUTANEOUS SCLEROSANT EMBOLIZATION COMPARISON:  BRTO protocol CT- 01/24/2016 MEDICATIONS: None ANESTHESIA/SEDATION: General - as administered by the Anesthesia department CONTRAST:  100 cc Omnipaque 300 FLUOROSCOPY TIME:  Fluoroscopy Time: 33 minutes 54 seconds (3,624 mGy). COMPLICATIONS: None immediate. PROCEDURE: Informed written consent was obtained from the patient's wife after a thorough discussion of the procedural risks, benefits and alternatives. All questions were addressed. Maximal Sterile Barrier Technique was utilized including caps, mask, sterile gowns, sterile gloves, sterile drape, hand hygiene and skin antiseptic. A timeout was performed prior to the initiation of the procedure. The right groin was prepped and draped in usual sterile fashion. Under direct ultrasound guidance, the right common femoral vein was accessed with a micropuncture kit. An ultrasound image was saved for procedural documentation purposes. Over a Bentson wire, the track was dilated allowing for placement of a 10 French TIPS sheath to the level of the mid IVC under intermittent fluoroscopic guidance. A Cobra catheter was utilized to select the left renal vein and a left renal venogram was performed. A Rosen wire was advanced into the caudal pole of the left kidney as the access sheath was advanced to the origin of left renal vein. Next, a 4 French angled glide catheter was cannulated within the sheath, next to the [place] wire, and with the use of a regular glidewire, was utilized to select the outflow origin of the hypertrophied gastric varix. The Rosen wire was then removed and over a coiled Amplatz wire, the vascular sheath was advanced into the outflow of the splenorenal shunt. Contrast injection confirmed appropriate positioning. Initially a Merci balloon, and ultimately, a 61 cm [place] scientific occlusion balloon was inflated and pulled caudal to the outflow confluence of the splenorenal shunt. With the  outflow of the shunt occluded, multiple contrast and CO2 venograms were performed in various obliquities to identify the anatomy of the hypertrophied gastric varices. Next, a high-flow micro catheter was advanced through the occlusion balloon into the caudal aspect of the markedly hypertrophied shunt. Multiple CO2 an contrast venograms were again performed to identify the anatomy of the hypertrophied gastric varices and to identify the inflow of a gastric varices. At this time, sclerosant was administered (in a mixture of 2 cc of Lipiodol, 4 cc of STS and 6 cc of air) under fluoroscopic guidance. Multiple spot fluoroscopic and radiographic images were obtained during the sclerosant administration. Given the patulous size of the outflow of the splenorenal shunt, the decision was made to leave the occlusion balloon in place overnight. The vascular sheath and occlusion balloon were secured at the right groin entrance site with interrupted suture and Tegaderm. Dressings were placed. The patient tolerated the procedure well and remained hemodynamically stable throughout the procedure. FINDINGS: Initial left venogram demonstrates the outflow of markedly hypertrophied splenorenal shunt as was demonstrated on preceding BRTO protocol abdominal CT. Selective venograms demonstrates a markedly hypertrophied splenorenal shunt draining multiple markedly enlarged gastric varices. CO2 venogram adequately demonstrates the inflow of the gastric varices from the splenic vein. Note was made a solitary tiny right inferior phrenic escape vein. Following fluoroscopic guided sclerosant administration, the sclerosant appears to be contained within near the entirety of the gastric varices and splenorenal shunt without evidence of spillage into the splenic or portal  veins and without evidence of extravasation. IMPRESSION: Technically successful BRTO procedure for bleeding gastric varices. PLAN: - The patient will return to the interventional  radiology suite tomorrow morning for fluoroscopic guided removal of occlusion balloon an vascular sheath. - Postprocedural BRTO protocol abdominal CT will be obtained on the morning of 3/18. Electronically Signed   By: Sandi Mariscal M.D.   On: 02/05/2016 10:50   Ir Fluoro Rm 30-60 Min  02/05/2016  CLINICAL DATA:  Upper GI bleed, status post gastric varicenal sclerosant embolization. Occlusion balloon was left intact and inflated overnight postprocedure. No evidence of continued upper GI bleed. EXAM: IR FLOURO RM 0-60 MIN COMPARISON:  Previous day's exam TECHNIQUE: The right femoral venous sheath and catheter were prepped and draped in usual sterile fashion. Maximal barrier sterile technique was utilized including caps, mask, sterile gowns, sterile gloves, sterile drape, hand hygiene and skin antiseptic. Fluoroscopic inspection of the gastric varices showed stable appearance of the opacified sclerosis a material. The occlusion balloon remains inflated. Under fluoroscopic observation, the occlusion balloon was slowly deflated. There is no change in orientation or position of the opacified sclerosant material. The occlusion balloon catheter was retracted. The sclerosant material was stable in appearance. The venous sheath was removed and hemostasis achieved at the site. The patient tolerated the procedure well. IMPRESSION: 1. Stable position orientation of gastric variceal sclerosant material. The occlusion balloon and sheath were removed without incident. Electronically Signed   By: Lucrezia Europe M.D.   On: 02/05/2016 13:21   Ir US Guide Vasc Access Right  02/05/2016  CLINICAL DATA:  History of alcoholic cirrhosis, now with bleeding gastric varices. Patient presents for BRT0 of gastric varices. EXAM: 1. ULTRASOUND GUIDANCE FOR VENOUS ACCESS 2. LEFT RENAL VENOGRAM 3. SELECTIVE GASTRIC VARICEAL VENOGRAM AND PERCUTANEOUS SCLEROSANT EMBOLIZATION COMPARISON:  BRTO protocol CT- 01/24/2016 MEDICATIONS: None  ANESTHESIA/SEDATION: General - as administered by the Anesthesia department CONTRAST:  100 cc Omnipaque 300 FLUOROSCOPY TIME:  Fluoroscopy Time: 33 minutes 54 seconds (3,624 mGy). COMPLICATIONS: None immediate. PROCEDURE: Informed written consent was obtained from the patient's wife after a thorough discussion of the procedural risks, benefits and alternatives. All questions were addressed. Maximal Sterile Barrier Technique was utilized including caps, mask, sterile gowns, sterile gloves, sterile drape, hand hygiene and skin antiseptic. A timeout was performed prior to the initiation of the procedure. The right groin was prepped and draped in usual sterile fashion. Under direct ultrasound guidance, the right common femoral vein was accessed with a micropuncture kit. An ultrasound image was saved for procedural documentation purposes. Over a Bentson wire, the track was dilated allowing for placement of a 10 French TIPS sheath to the level of the mid IVC under intermittent fluoroscopic guidance. A Cobra catheter was utilized to select the left renal vein and a left renal venogram was performed. A Rosen wire was advanced into the caudal pole of the left kidney as the access sheath was advanced to the origin of left renal vein. Next, a 4 French angled glide catheter was cannulated within the sheath, next to the [place] wire, and with the use of a regular glidewire, was utilized to select the outflow origin of the hypertrophied gastric varix. The Rosen wire was then removed and over a coiled Amplatz wire, the vascular sheath was advanced into the outflow of the splenorenal shunt. Contrast injection confirmed appropriate positioning. Initially a Merci balloon, and ultimately, a 65 cm [place] scientific occlusion balloon was inflated and pulled caudal to the outflow confluence of the splenorenal shunt.  With the outflow of the shunt occluded, multiple contrast and CO2 venograms were performed in various obliquities to identify  the anatomy of the hypertrophied gastric varices. Next, a high-flow micro catheter was advanced through the occlusion balloon into the caudal aspect of the markedly hypertrophied shunt. Multiple CO2 an contrast venograms were again performed to identify the anatomy of the hypertrophied gastric varices and to identify the inflow of a gastric varices. At this time, sclerosant was administered (in a mixture of 2 cc of Lipiodol, 4 cc of STS and 6 cc of air) under fluoroscopic guidance. Multiple spot fluoroscopic and radiographic images were obtained during the sclerosant administration. Given the patulous size of the outflow of the splenorenal shunt, the decision was made to leave the occlusion balloon in place overnight. The vascular sheath and occlusion balloon were secured at the right groin entrance site with interrupted suture and Tegaderm. Dressings were placed. The patient tolerated the procedure well and remained hemodynamically stable throughout the procedure. FINDINGS: Initial left venogram demonstrates the outflow of markedly hypertrophied splenorenal shunt as was demonstrated on preceding BRTO protocol abdominal CT. Selective venograms demonstrates a markedly hypertrophied splenorenal shunt draining multiple markedly enlarged gastric varices. CO2 venogram adequately demonstrates the inflow of the gastric varices from the splenic vein. Note was made a solitary tiny right inferior phrenic escape vein. Following fluoroscopic guided sclerosant administration, the sclerosant appears to be contained within near the entirety of the gastric varices and splenorenal shunt without evidence of spillage into the splenic or portal veins and without evidence of extravasation. IMPRESSION: Technically successful BRTO procedure for bleeding gastric varices. PLAN: - The patient will return to the interventional radiology suite tomorrow morning for fluoroscopic guided removal of occlusion balloon an vascular sheath. -  Postprocedural BRTO protocol abdominal CT will be obtained on the morning of 3/18. Electronically Signed   By: Sandi Mariscal M.D.   On: 02/05/2016 10:50   Dg Chest Port 1 View  02/06/2016  CLINICAL DATA:  Acute respiratory failure EXAM: PORTABLE CHEST 1 VIEW COMPARISON:  Chest radiograph from one day prior. FINDINGS: Interval extubation. Stable cardiomediastinal silhouette with normal heart size. No pneumothorax. No pleural effusion. No pulmonary edema. Curvilinear bibasilar lung opacities, significantly improved. IMPRESSION: Bibasilar curvilinear lung opacities, significantly improved, favor atelectasis. Electronically Signed   By: Ilona Sorrel M.D.   On: 02/06/2016 10:00   Portable Chest Xray  02/05/2016  CLINICAL DATA:  Encounter for intubation EXAM: PORTABLE CHEST 1 VIEW COMPARISON:  01/01/2016 FINDINGS: Endotracheal tube tip is just below the clavicular heads. Vascular pedicle widening which is newly seen. Normal heart size for technique. There is a hazy right basilar opacity. Streaky left basilar opacity suggesting atelectasis. No evidence of pneumothorax. No pulmonary edema. Newly seen embolic agent in the left upper quadrant. IMPRESSION: 1. Endotracheal tube in good position. 2. Right basilar opacity is likely combination of atelectasis and layering effusion. 3. Left basilar atelectasis. Electronically Signed   By: Monte Fantasia M.D.   On: 02/05/2016 02:06   [place] Guide Roadmapping  02/05/2016  CLINICAL DATA:  History of alcoholic cirrhosis, now with bleeding gastric varices. Patient presents for BRT0 of gastric varices. EXAM: 1. ULTRASOUND GUIDANCE FOR VENOUS ACCESS 2. LEFT RENAL VENOGRAM 3. SELECTIVE GASTRIC VARICEAL VENOGRAM AND PERCUTANEOUS SCLEROSANT EMBOLIZATION COMPARISON:  BRTO protocol CT- 01/24/2016 MEDICATIONS: None ANESTHESIA/SEDATION: General - as administered by the Anesthesia department CONTRAST:  100 cc Omnipaque 300 FLUOROSCOPY TIME:  Fluoroscopy  Time: 33 minutes  54 seconds (3,624 mGy). COMPLICATIONS: None immediate. PROCEDURE: Informed written consent was obtained from the patient's wife after a thorough discussion of the procedural risks, benefits and alternatives. All questions were addressed. Maximal Sterile Barrier Technique was utilized including caps, mask, sterile gowns, sterile gloves, sterile drape, hand hygiene and skin antiseptic. A timeout was performed prior to the initiation of the procedure. The right groin was prepped and draped in usual sterile fashion. Under direct ultrasound guidance, the right common femoral vein was accessed with a micropuncture kit. An ultrasound image was saved for procedural documentation purposes. Over a Bentson wire, the track was dilated allowing for placement of a 10 French TIPS sheath to the level of the mid IVC under intermittent fluoroscopic guidance. A Cobra catheter was utilized to select the left renal vein and a left renal venogram was performed. A Rosen wire was advanced into the caudal pole of the left kidney as the access sheath was advanced to the origin of left renal vein. Next, a 4 French angled glide catheter was cannulated within the sheath, next to the Warner wire, and with the use of a regular glidewire, was utilized to select the outflow origin of the hypertrophied gastric varix. The Rosen wire was then removed and over a coiled Amplatz wire, the vascular sheath was advanced into the outflow of the splenorenal shunt. Contrast injection confirmed appropriate positioning. Initially a Merci balloon, and ultimately, a 35 cm Boston scientific occlusion balloon was inflated and pulled caudal to the outflow confluence of the splenorenal shunt. With the outflow of the shunt occluded, multiple contrast and CO2 venograms were performed in various obliquities to identify the anatomy of the hypertrophied gastric varices. Next, a high-flow micro catheter was advanced through the occlusion balloon into the  caudal aspect of the markedly hypertrophied shunt. Multiple CO2 an contrast venograms were again performed to identify the anatomy of the hypertrophied gastric varices and to identify the inflow of a gastric varices. At this time, sclerosant was administered (in a mixture of 2 cc of Lipiodol, 4 cc of STS and 6 cc of air) under fluoroscopic guidance. Multiple spot fluoroscopic and radiographic images were obtained during the sclerosant administration. Given the patulous size of the outflow of the splenorenal shunt, the decision was made to leave the occlusion balloon in place overnight. The vascular sheath and occlusion balloon were secured at the right groin entrance site with interrupted suture and Tegaderm. Dressings were placed. The patient tolerated the procedure well and remained hemodynamically stable throughout the procedure. FINDINGS: Initial left venogram demonstrates the outflow of markedly hypertrophied splenorenal shunt as was demonstrated on preceding BRTO protocol abdominal CT. Selective venograms demonstrates a markedly hypertrophied splenorenal shunt draining multiple markedly enlarged gastric varices. CO2 venogram adequately demonstrates the inflow of the gastric varices from the splenic vein. Note was made a solitary tiny right inferior phrenic escape vein. Following fluoroscopic guided sclerosant administration, the sclerosant appears to be contained within near the entirety of the gastric varices and splenorenal shunt without evidence of spillage into the splenic or portal veins and without evidence of extravasation. IMPRESSION: Technically successful BRTO procedure for bleeding gastric varices. PLAN: - The patient will return to the interventional radiology suite tomorrow morning for fluoroscopic guided removal of occlusion balloon an vascular sheath. - Postprocedural BRTO protocol abdominal CT will be obtained on the morning of 3/18. Electronically Signed   By: Sandi Mariscal M.D.   On: 02/05/2016  10:50    Labs:  CBC:  Recent Labs  02/05/16  0400 02/05/16 1914 02/06/16 0051 02/06/16 0735  WBC 11.0* 8.1 12.5* 9.0  HGB 8.6* 6.8* 8.6* 8.6*  HCT 24.9* 20.0* 25.9* 24.8*  PLT 63* 20* 70* 63*    COAGS:  Recent Labs  06/02/15 0715 02/04/16 1840 02/05/16 0111  INR 1.35 1.48 1.45    BMP:  Recent Labs  02/04/16 1845 02/05/16 0111 02/05/16 1702 02/06/16 0735  NA 151* 150* 151* 146*  K 4.4 6.0* 4.8 4.9  CL 118* 117* 118* 117*  CO2 _0 GLUCOSE 136* 136* 139* 126*  BUN 37* 38* 44* 36*  CALCIUM 8.0* 7.7* 7.7* 7.6*  CREATININE 1.16 1.25* 1.42* 1.24  GFRNONAA >60 >60 53* >60  GFRAA >60 >60 >60 >60    LIVER FUNCTION TESTS:  Recent Labs  10/02/15 2337 01/02/16 1212 02/04/16 1845 02/06/16 0735  BILITOT 1.7* 3.2* 1.5* 1.8*  AST 106* 68* 54* 90*  ALT 83* 55 51 58  ALKPHOS 71 64 54 61  PROT 6.5 6.0* 5.2* 5.3*  ALBUMIN 3.2* 2.8* 2.6* 2.7*    Assessment and Plan:  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  S/P BRTO by Dr. Pascal Lux  02/04/2016 with return for removal of balloon on 02/05/2016  Can remove dressing from right groin tomorrow.   Electronically Signed: Murrell Redden PA-C 02/06/2016, 11:52 AM   I spent a total of 15 Minutes at the the patient's bedside AND on the patient's hospital floor or unit, greater than 50% of which was counseling/coordinating care for f/u after BRTO

## 2016-02-07 ENCOUNTER — Inpatient Hospital Stay (HOSPITAL_COMMUNITY): Payer: Medicaid Other

## 2016-02-07 DIAGNOSIS — K920 Hematemesis: Secondary | ICD-10-CM

## 2016-02-07 DIAGNOSIS — I252 Old myocardial infarction: Secondary | ICD-10-CM

## 2016-02-07 DIAGNOSIS — Z7901 Long term (current) use of anticoagulants: Secondary | ICD-10-CM

## 2016-02-07 DIAGNOSIS — D696 Thrombocytopenia, unspecified: Secondary | ICD-10-CM

## 2016-02-07 LAB — BASIC METABOLIC PANEL
ANION GAP: 7 (ref 5–15)
BUN: 21 mg/dL — ABNORMAL HIGH (ref 6–20)
CALCIUM: 7.5 mg/dL — AB (ref 8.9–10.3)
CO2: 24 mmol/L (ref 22–32)
CREATININE: 1.06 mg/dL (ref 0.61–1.24)
Chloride: 108 mmol/L (ref 101–111)
GLUCOSE: 123 mg/dL — AB (ref 65–99)
Potassium: 3.8 mmol/L (ref 3.5–5.1)
Sodium: 139 mmol/L (ref 135–145)

## 2016-02-07 LAB — CBC
HCT: 23.4 % — ABNORMAL LOW (ref 39.0–52.0)
Hemoglobin: 7.8 g/dL — ABNORMAL LOW (ref 13.0–17.0)
MCH: 31.3 pg (ref 26.0–34.0)
MCHC: 33.3 g/dL (ref 30.0–36.0)
MCV: 94 fL (ref 78.0–100.0)
PLATELETS: 49 10*3/uL — AB (ref 150–400)
RBC: 2.49 MIL/uL — ABNORMAL LOW (ref 4.22–5.81)
RDW: 17.2 % — AB (ref 11.5–15.5)
WBC: 6.1 10*3/uL (ref 4.0–10.5)

## 2016-02-07 LAB — GLUCOSE, CAPILLARY
GLUCOSE-CAPILLARY: 109 mg/dL — AB (ref 65–99)
GLUCOSE-CAPILLARY: 115 mg/dL — AB (ref 65–99)
GLUCOSE-CAPILLARY: 126 mg/dL — AB (ref 65–99)
Glucose-Capillary: 138 mg/dL — ABNORMAL HIGH (ref 65–99)
Glucose-Capillary: 121 mg/dL — ABNORMAL HIGH (ref 65–99)
Glucose-Capillary: 107 mg/dL — ABNORMAL HIGH (ref 65–99)

## 2016-02-07 LAB — APTT: aPTT: 30 seconds (ref 24–37)

## 2016-02-07 LAB — MAGNESIUM: Magnesium: 2 mg/dL (ref 1.7–2.4)

## 2016-02-07 LAB — PROTIME-INR
INR: 1.43 (ref 0.00–1.49)
PROTHROMBIN TIME: 17.5 s — AB (ref 11.6–15.2)

## 2016-02-07 LAB — PHOSPHORUS: Phosphorus: 2.4 mg/dL — ABNORMAL LOW (ref 2.5–4.6)

## 2016-02-07 MED ORDER — PANTOPRAZOLE SODIUM 40 MG PO TBEC
40.0000 mg | DELAYED_RELEASE_TABLET | Freq: Two times a day (BID) | ORAL | Status: DC
  Administered 2016-02-07 – 2016-02-13 (×13): 40 mg via ORAL
  Filled 2016-02-07 (×13): qty 1

## 2016-02-07 NOTE — Progress Notes (Signed)
Vincent Ochoa   DOB:[birth year]   [id number]    Subjective: The patient was well known to me. 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 I saw him first in December 2015. His platelet count usually stays between 40 to 70,000. Prior to this admission, he was taking NSAID for chronic joint pain along with anti-platelet agents that was the likely precipitating factor for GI bleed. To my knowledge he has not had upper GI evaluation. He was successfully resuscitated with transfusion and had IR embolization with no further bleeding.  Objective:  Filed Vitals:   02/07/16 1300 02/07/16 1400  BP: 134/65 124/68  Pulse: 94 82  Temp:    Resp: 19 16     Intake/Output Summary (Last 24 hours) at 02/07/16 1515 Last data filed at 02/07/16 1400  Gross per 24 hour  Intake   2800 ml  Output    950 ml  Net   1850 ml    GENERAL:alert, no distress and comfortable. He is morbidly obese SKIN: skin color, texture, turgor are normal, no rashes or significant lesions EYES: normal, Conjunctiva are pink and non-injected, sclera clear Musculoskeletal:no cyanosis of digits and no clubbing  NEURO: alert & oriented x 3 with fluent speech, no focal motor/sensory deficits   Labs:  Lab Results  Component Value Date   WBC 6.1 02/07/2016   HGB 7.8* 02/07/2016   HCT 23.4* 02/07/2016   MCV 94.0 02/07/2016   PLT 49* 02/07/2016   NEUTROABS 5.5 02/06/2016    Lab Results  Component Value Date   NA 139 02/07/2016   K 3.8 02/07/2016   CL 108 02/07/2016   CO2 24 02/07/2016    Studies:  Dg Chest Port 1 View  02/07/2016  CLINICAL DATA:  Respiratory failure EXAM: PORTABLE CHEST 1 VIEW COMPARISON:  February 06, 2016 FINDINGS: There is patchy bibasilar atelectasis, stable. No new opacity. Heart is upper normal in size with pulmonary vascularity within normal limits. No adenopathy. No bone lesions.  IMPRESSION: Patchy bibasilar atelectasis, stable. No new opacity. No change in cardiac silhouette. Electronically Signed   By: Lowella Grip III M.D.   On: 02/07/2016 07:47   Dg Chest Port 1 View  02/06/2016  CLINICAL DATA:  Acute respiratory failure EXAM: PORTABLE CHEST 1 VIEW COMPARISON:  Chest radiograph from one day prior. FINDINGS: Interval extubation. Stable cardiomediastinal silhouette with normal heart size. No pneumothorax. No pleural effusion. No pulmonary edema. Curvilinear bibasilar lung opacities, significantly improved. IMPRESSION: Bibasilar curvilinear lung opacities, significantly improved, favor atelectasis. Electronically Signed   By: Ilona Sorrel M.D.   On: 02/06/2016 10:00    Assessment & Plan:   #1 Anemia due to blood loss Transfuse prn per ICU protocol  #2 Recent upper GI bleed #3 Liver disease with variceal bleeding He would benefit from GI evaluation He had PPI and octreotide infusions Bleeding ulcer needs to be excluded prior to initiation of antiplatelet agents  #4 Cardiac disease On medical management. 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 Hence, from hematology stand point, the liver disease and splenomegaly are non-reversible and there is nothing I can do apart from just  recommend transfusion as needed if he has recurrent bleeding  Please do not hesitate to call me if questions arise.   [place], [place], MD 02/07/2016  3:15 PM

## 2016-02-07 NOTE — Progress Notes (Signed)
PULMONARY / CRITICAL CARE MEDICINE   Name: Vincent Ochoa MRN: [id number] DOB: 06-23-1956    ADMISSION DATE:  02/04/2016 CONSULTATION DATE:  02/04/16  REFERRING MD:  [facility]  CHIEF COMPLAINT:  Hematemesis  HISTORY OF PRESENT ILLNESS:   Vincent Ochoa is a 60 y.o. male with PMH as outlined below including cirrhosis.  He presented to ED at [facility] on 03/15 with hematemesis that began suddenly the night prior around 2300.  He had one large episode of vomiting and on arrival to ED, he was hypotensive and looked critically ill.  Per his wife, he vomited so much of blood that it filled at least 2 small trash cans.  Blood was dark in color without any coffee grounds or clots.  Of note, pt is on plavix and ASA as outpatient which he takes for cardiac stent.  He had not had any similar symptoms in the past and denied heavy NSAID use.  He drinks minimally now but does apparently have hx of heavy ETOH use back in the 90's.  GI consulted 03/15 for hematemesis.  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  Also required PRBC transfusion.  Had bleeding that recurred AM 03/16.  Seen agaom by GI who reviewed case with IR and pt deemed to be a candidate for BRTO.  Pt was subsequently transferred to [facility] for procedure.  On arrival to [facility] ICU, he is awake but is somnolent and somewhat encephalopathic.  He is able to answer basic questions but is confused and unable to tell me where he is and why he is here; however, he is able to confirm that he has had hematemesis.  SUBJECTIVE: Alert  VITAL SIGNS: BP 131/69 mmHg  Pulse 85  Temp(Src) 98.5 F (36.9 C) (Oral)  Resp 16  Wt 322 lb 12.1 oz (146.4 kg)  SpO2 93%  HEMODYNAMICS:    VENTILATOR SETTINGS:    INTAKE / OUTPUT: I/O last 3 completed shifts: In: 4188.4 [I.V.:3600; Blood:538.4; IV Piggyback:50] Out: 850 [Urine:850]   PHYSICAL EXAMINATION: General: Adult male, in NAD.Alert Neuro: Awake and follows  commands. Rass 0 HEENT: /AT. PERRL, sclerae anicteric. Cardiovascular: RRR, no M/R/G.  Lungs: CTA, decreased bs bases Abdomen: BS x 4, soft, NT/ND, shift Musculoskeletal: No gross deformities, no edema.  Skin: Intact, warm, no rashes.  LABS:  BMET  Recent Labs Lab 02/05/16 1702 02/06/16 0735 02/07/16 0704  NA 151* 146* 139  K 4.8 4.9 3.8  CL 118* 117* 108  CO2 23 22 24   BUN 44* 36* 21*  CREATININE 1.42* 1.24 1.06  GLUCOSE 139* 126* 123*    Electrolytes  Recent Labs Lab 02/04/16 1845 02/05/16 0111 02/05/16 1702 02/06/16 0735 02/07/16 0704  CALCIUM 8.0* 7.7* 7.7* 7.6* 7.5*  MG 2.0 2.2  --   --  2.0  PHOS 3.1 4.9*  --   --  2.4*    CBC  Recent Labs Lab 02/06/16 0735 02/06/16 1831 02/07/16 0704  WBC 9.0 7.9 6.1  HGB 8.6* 8.2* 7.8*  HCT 24.8* 25.3* 23.4*  PLT 63* 52* 49*    Coag's  Recent Labs Lab 02/04/16 1840 02/05/16 0111 02/07/16 0704  APTT  --   --  30  INR 1.48 1.45 1.43    Sepsis Markers  Recent Labs Lab 02/04/16 1850 02/05/16 0133  LATICACIDVEN 2.3* 2.0    ABG  Recent Labs Lab 02/05/16 0145 02/05/16 0353  PHART 7.173* 7.331*  PCO2ART 70.9* 44.5  PO2ART 87.0  104*    Liver Enzymes  Recent Labs Lab 02/04/16 1845 02/06/16 0735  AST 54* 90*  ALT 51 58  ALKPHOS 54 61  BILITOT 1.5* 1.8*  ALBUMIN 2.6* 2.7*    Cardiac Enzymes  Recent Labs Lab 02/04/16 1845 02/05/16 0111 02/05/16 1702  TROPONINI 0.03 <0.03 0.05*    Glucose  Recent Labs Lab 02/06/16 1216 02/06/16 1633 02/06/16 2013 02/07/16 0008 02/07/16 0501 02/07/16 0842  GLUCAP 112* 144* 118* 115* 138* 109*    Imaging Dg Chest Port 1 View  02/07/2016  CLINICAL DATA:  Respiratory failure EXAM: PORTABLE CHEST 1 VIEW COMPARISON:  February 06, 2016 FINDINGS: There is patchy bibasilar atelectasis, stable. No new opacity. Heart is upper normal in size with pulmonary vascularity within normal limits. No adenopathy. No bone lesions. IMPRESSION: Patchy  bibasilar atelectasis, stable. No new opacity. No change in cardiac silhouette. Electronically Signed   By: Lowella Grip III M.D.   On: 02/07/2016 07:47     STUDIES:  EGD 03/15 > gastric varices but no active bleeding. BRTO 3/16  CULTURES: None.  ANTIBIOTICS: Ceftriaxone 03/15 >  SIGNIFICANT EVENTS: 03/15 > admitted to [facility] with hematemesis. 03/16 > transferred to [facility] for BRTO, vented  LINES/TUBES: 3/16 ett>>>3/17  DISCUSSION: 60 y.o. M with cirrhosis, admitted to [facility] 03/15 for hematemesis.  Had anemia requiring transfusion and EGD which did not show active bleed.  On 03/16, he was transferred to [facility] for BRTO procedure.  ASSESSMENT / PLAN:  GASTROINTESTINAL A:   Hematemesis - unclear etiology; varices vs PUD. Cirrhosis. GI prophylaxis. Nutrition. P:   GI not called  dc  octreotide, PPI infusions. Change PPI to PO Continue ceftriaxone per ID section, consider 7 days NPO  HEMATOLOGIC  Recent Labs  02/06/16 1831 02/07/16 0704  HGB 8.2* 7.8*    A:   Acute blood loss anemia. Hx thrombocytopenia. plts 20->52 post plt infusion. Continue to monitor.  VTE Prophylaxis. P:  SCD's. Cbc daily Plts and prbc given 3/17 Hematology consult  PULMONARY A: Acute hypoxic respiratory failure. Hx OSA - on CPAP. Tobacco use disorder. Effusion rt base P:   Even to neg balance Nocturnal cpap   CARDIOVASCULAR A:  Hx CAD s/p PCI (on plavix, ASA), HLD, PAF. P:  Monitor hemodynamics. Hold outpatient plavix, ASA, atorvastatin, furosemide, imdur, toprol-xl, nitro.  RENAL Lab Results  Component Value Date   CREATININE 1.06 02/07/2016   CREATININE 1.24 02/06/2016   CREATININE 1.42* 02/05/2016   CREATININE 1.09 07/29/2011    Recent Labs Lab 02/05/16 1702 02/06/16 0735 02/07/16 0704  K 4.8 4.9 3.8    Recent Labs Lab 02/05/16 1702 02/06/16 0735 02/07/16 0704  NA 151* 146* 139     A:   Rt effusion likely Hypernatremia, resolved P:   Dc saline Add  d5w at kvocc/hr.  INFECTIOUS A:   SBP prophylaxis. P:   Abx as above (ceftriaxone), add stop date 5 days  ENDOCRINE CBG (last 3)   Recent Labs  02/07/16 0008 02/07/16 0501 02/07/16 0842  GLUCAP 115* 138* 109*     A:   At risk hyperglycemia on octreotide P:   cbg  NEUROLOGIC A:   Acute metabolic encephalopathy. Resolved Hx back pain. High risk hepatic encpeph P:   Monitor clinically. Hold outpatient bupropion, cyclobenzaprine, norco.     Family updated: None.  Interdisciplinary Family Meeting v Palliative Care Meeting:  Due by: 03/23.   Richardson Landry Minor ACNP Maryanna Shape PCCM Pager [phone] till 3 pm If no answer page [phone]  02/07/2016, 10:43 AM   Attending Note:  I have examined patient, reviewed labs, studies and notes. I have discussed the case with S Minor, and I agree with the data and plans as amended above. Hemodynamically stable, no further bleeding. To SDU  Baltazar Apo, MD, PhD 02/07/2016, 9:06 PM [facility] [phone] or if no answer [phone]

## 2016-02-07 NOTE — Progress Notes (Signed)
Patient ID: Vincent Ochoa, male   DOB: Nov 14, 1956, 60 y.o.   MRN: [id number]    Referring Physician(s): Tre Nicoletta Dress (HP, GI)  Supervising Physician: Markus Daft  Chief Complaint: F/U after BRTO  Subjective: Doing well this morning.  Tolerating a solid diet.  No further bleeding  Allergies: Review of patient's allergies indicates no known allergies.  Medications: Prior to Admission medications   Medication Sig Start Date End Date Taking? Authorizing Provider  aspirin 81 MG tablet Take 1 tablet (81 mg total) by mouth daily. 10/31/14  Yes Dayna N Dunn, PA-C  buPROPion (WELLBUTRIN XL) 150 MG 24 hr tablet Take 150 mg by mouth daily.   Yes Historical Provider, MD  clopidogrel (PLAVIX) 75 MG tablet Take 1 tablet (75 mg total) by mouth daily. 06/10/15  Yes Rogelia Mire, NP  furosemide (LASIX) 20 MG tablet Take 1 tablet (20 mg total) by mouth daily. 02/03/16  Yes Rosemarie Ax, MD  HYDROcodone-acetaminophen (NORCO/VICODIN) 5-325 MG tablet Take 1 tablet by mouth every 6 (six) hours as needed for moderate pain or severe pain. 02/03/16  Yes Rosemarie Ax, MD  isosorbide mononitrate (IMDUR) 30 MG 24 hr tablet Take 1 tablet (30 mg total) by mouth daily. 02/03/16  Yes Rosemarie Ax, MD  metoprolol succinate (TOPROL-XL) 25 MG 24 hr tablet Take 0.5 tablets (12.5 mg total) by mouth daily. 02/26/15  Yes Sherren Mocha, MD  nitroGLYCERIN (NITROSTAT) 0.4 MG SL tablet Place 1 tablet (0.4 mg total) under the tongue every 5 (five) minutes as needed for chest pain (up to 3 doses). 10/31/14  Yes Dayna N Dunn, PA-C  atorvastatin (LIPITOR) 10 MG tablet Take 1 tablet (10 mg total) by mouth every evening. Patient not taking: Reported on 01/01/2016 12/16/14   Sherren Mocha, MD  cyclobenzaprine (FLEXERIL) 10 MG tablet Take 1 tablet (10 mg total) by mouth 3 (three) times daily as needed for muscle spasms. Patient not taking: Reported on 01/01/2016 09/14/15   Rosemarie Ax, MD    Vital Signs: BP 121/61 mmHg   Pulse 74  Temp(Src) 98.5 F (36.9 C) (Oral)  Resp 12  Wt 322 lb 12.1 oz (146.4 kg)  SpO2 97%  Physical Exam: Abd: soft, obese, right groin site is c/d/i.  No edema, bleeding, or ecchymosis  Imaging: Ir Angiogram Selective Each Additional Vessel  02/05/2016  CLINICAL DATA:  History of alcoholic cirrhosis, now with bleeding gastric varices. Patient presents for BRT0 of gastric varices. EXAM: 1. ULTRASOUND GUIDANCE FOR VENOUS ACCESS 2. LEFT RENAL VENOGRAM 3. SELECTIVE GASTRIC VARICEAL VENOGRAM AND PERCUTANEOUS SCLEROSANT EMBOLIZATION COMPARISON:  BRTO protocol CT- 01/24/2016 MEDICATIONS: None ANESTHESIA/SEDATION: General - as administered by the Anesthesia department CONTRAST:  100 cc Omnipaque 300 FLUOROSCOPY TIME:  Fluoroscopy Time: 33 minutes 54 seconds (3,624 mGy). COMPLICATIONS: None immediate. PROCEDURE: Informed written consent was obtained from the patient's wife after a thorough discussion of the procedural risks, benefits and alternatives. All questions were addressed. Maximal Sterile Barrier Technique was utilized including caps, mask, sterile gowns, sterile gloves, sterile drape, hand hygiene and skin antiseptic. A timeout was performed prior to the initiation of the procedure. The right groin was prepped and draped in usual sterile fashion. Under direct ultrasound guidance, the right common femoral vein was accessed with a micropuncture kit. An ultrasound image was saved for procedural documentation purposes. Over a Bentson wire, the track was dilated allowing for placement of a 10 French TIPS sheath to the level of the mid IVC under intermittent fluoroscopic guidance. A  Cobra catheter was utilized to select the left renal vein and a left renal venogram was performed. A Rosen wire was advanced into the caudal pole of the left kidney as the access sheath was advanced to the origin of left renal vein. Next, a 4 French angled glide catheter was cannulated within the sheath, next to the [place] wire,  and with the use of a regular glidewire, was utilized to select the outflow origin of the hypertrophied gastric varix. The Rosen wire was then removed and over a coiled Amplatz wire, the vascular sheath was advanced into the outflow of the splenorenal shunt. Contrast injection confirmed appropriate positioning. Initially a Merci balloon, and ultimately, a 5 cm [place] scientific occlusion balloon was inflated and pulled caudal to the outflow confluence of the splenorenal shunt. With the outflow of the shunt occluded, multiple contrast and CO2 venograms were performed in various obliquities to identify the anatomy of the hypertrophied gastric varices. Next, a high-flow micro catheter was advanced through the occlusion balloon into the caudal aspect of the markedly hypertrophied shunt. Multiple CO2 an contrast venograms were again performed to identify the anatomy of the hypertrophied gastric varices and to identify the inflow of a gastric varices. At this time, sclerosant was administered (in a mixture of 2 cc of Lipiodol, 4 cc of STS and 6 cc of air) under fluoroscopic guidance. Multiple spot fluoroscopic and radiographic images were obtained during the sclerosant administration. Given the patulous size of the outflow of the splenorenal shunt, the decision was made to leave the occlusion balloon in place overnight. The vascular sheath and occlusion balloon were secured at the right groin entrance site with interrupted suture and Tegaderm. Dressings were placed. The patient tolerated the procedure well and remained hemodynamically stable throughout the procedure. FINDINGS: Initial left venogram demonstrates the outflow of markedly hypertrophied splenorenal shunt as was demonstrated on preceding BRTO protocol abdominal CT. Selective venograms demonstrates a markedly hypertrophied splenorenal shunt draining multiple markedly enlarged gastric varices. CO2 venogram adequately demonstrates the inflow of the gastric  varices from the splenic vein. Note was made a solitary tiny right inferior phrenic escape vein. Following fluoroscopic guided sclerosant administration, the sclerosant appears to be contained within near the entirety of the gastric varices and splenorenal shunt without evidence of spillage into the splenic or portal veins and without evidence of extravasation. IMPRESSION: Technically successful BRTO procedure for bleeding gastric varices. PLAN: - The patient will return to the interventional radiology suite tomorrow morning for fluoroscopic guided removal of occlusion balloon an vascular sheath. - Postprocedural BRTO protocol abdominal CT will be obtained on the morning of 3/18. Electronically Signed   By: Sandi Mariscal M.D.   On: 02/05/2016 10:50   Ir Venogram Renal Uni Left  02/05/2016  CLINICAL DATA:  History of alcoholic cirrhosis, now with bleeding gastric varices. Patient presents for BRT0 of gastric varices. EXAM: 1. ULTRASOUND GUIDANCE FOR VENOUS ACCESS 2. LEFT RENAL VENOGRAM 3. SELECTIVE GASTRIC VARICEAL VENOGRAM AND PERCUTANEOUS SCLEROSANT EMBOLIZATION COMPARISON:  BRTO protocol CT- 01/24/2016 MEDICATIONS: None ANESTHESIA/SEDATION: General - as administered by the Anesthesia department CONTRAST:  100 cc Omnipaque 300 FLUOROSCOPY TIME:  Fluoroscopy Time: 33 minutes 54 seconds (3,624 mGy). COMPLICATIONS: None immediate. PROCEDURE: Informed written consent was obtained from the patient's wife after a thorough discussion of the procedural risks, benefits and alternatives. All questions were addressed. Maximal Sterile Barrier Technique was utilized including caps, mask, sterile gowns, sterile gloves, sterile drape, hand hygiene and skin antiseptic. A timeout was performed prior to the  initiation of the procedure. The right groin was prepped and draped in usual sterile fashion. Under direct ultrasound guidance, the right common femoral vein was accessed with a micropuncture kit. An ultrasound image was saved  for procedural documentation purposes. Over a Bentson wire, the track was dilated allowing for placement of a 10 French TIPS sheath to the level of the mid IVC under intermittent fluoroscopic guidance. A Cobra catheter was utilized to select the left renal vein and a left renal venogram was performed. A Rosen wire was advanced into the caudal pole of the left kidney as the access sheath was advanced to the origin of left renal vein. Next, a 4 French angled glide catheter was cannulated within the sheath, next to the [place] wire, and with the use of a regular glidewire, was utilized to select the outflow origin of the hypertrophied gastric varix. The Rosen wire was then removed and over a coiled Amplatz wire, the vascular sheath was advanced into the outflow of the splenorenal shunt. Contrast injection confirmed appropriate positioning. Initially a Merci balloon, and ultimately, a 43 cm [place] scientific occlusion balloon was inflated and pulled caudal to the outflow confluence of the splenorenal shunt. With the outflow of the shunt occluded, multiple contrast and CO2 venograms were performed in various obliquities to identify the anatomy of the hypertrophied gastric varices. Next, a high-flow micro catheter was advanced through the occlusion balloon into the caudal aspect of the markedly hypertrophied shunt. Multiple CO2 an contrast venograms were again performed to identify the anatomy of the hypertrophied gastric varices and to identify the inflow of a gastric varices. At this time, sclerosant was administered (in a mixture of 2 cc of Lipiodol, 4 cc of STS and 6 cc of air) under fluoroscopic guidance. Multiple spot fluoroscopic and radiographic images were obtained during the sclerosant administration. Given the patulous size of the outflow of the splenorenal shunt, the decision was made to leave the occlusion balloon in place overnight. The vascular sheath and occlusion balloon were secured at the right groin  entrance site with interrupted suture and Tegaderm. Dressings were placed. The patient tolerated the procedure well and remained hemodynamically stable throughout the procedure. FINDINGS: Initial left venogram demonstrates the outflow of markedly hypertrophied splenorenal shunt as was demonstrated on preceding BRTO protocol abdominal CT. Selective venograms demonstrates a markedly hypertrophied splenorenal shunt draining multiple markedly enlarged gastric varices. CO2 venogram adequately demonstrates the inflow of the gastric varices from the splenic vein. Note was made a solitary tiny right inferior phrenic escape vein. Following fluoroscopic guided sclerosant administration, the sclerosant appears to be contained within near the entirety of the gastric varices and splenorenal shunt without evidence of spillage into the splenic or portal veins and without evidence of extravasation. IMPRESSION: Technically successful BRTO procedure for bleeding gastric varices. PLAN: - The patient will return to the interventional radiology suite tomorrow morning for fluoroscopic guided removal of occlusion balloon an vascular sheath. - Postprocedural BRTO protocol abdominal CT will be obtained on the morning of 3/18. Electronically Signed   By: Sandi Mariscal M.D.   On: 02/05/2016 10:50   Ir Angiogram Follow Up Study  02/05/2016  CLINICAL DATA:  History of alcoholic cirrhosis, now with bleeding gastric varices. Patient presents for BRT0 of gastric varices. EXAM: 1. ULTRASOUND GUIDANCE FOR VENOUS ACCESS 2. LEFT RENAL VENOGRAM 3. SELECTIVE GASTRIC VARICEAL VENOGRAM AND PERCUTANEOUS SCLEROSANT EMBOLIZATION COMPARISON:  BRTO protocol CT- 01/24/2016 MEDICATIONS: None ANESTHESIA/SEDATION: General - as administered by the Anesthesia department CONTRAST:  100 cc  Omnipaque 300 FLUOROSCOPY TIME:  Fluoroscopy Time: 33 minutes 54 seconds (3,624 mGy). COMPLICATIONS: None immediate. PROCEDURE: Informed written consent was obtained from the  patient's wife after a thorough discussion of the procedural risks, benefits and alternatives. All questions were addressed. Maximal Sterile Barrier Technique was utilized including caps, mask, sterile gowns, sterile gloves, sterile drape, hand hygiene and skin antiseptic. A timeout was performed prior to the initiation of the procedure. The right groin was prepped and draped in usual sterile fashion. Under direct ultrasound guidance, the right common femoral vein was accessed with a micropuncture kit. An ultrasound image was saved for procedural documentation purposes. Over a Bentson wire, the track was dilated allowing for placement of a 10 French TIPS sheath to the level of the mid IVC under intermittent fluoroscopic guidance. A Cobra catheter was utilized to select the left renal vein and a left renal venogram was performed. A Rosen wire was advanced into the caudal pole of the left kidney as the access sheath was advanced to the origin of left renal vein. Next, a 4 French angled glide catheter was cannulated within the sheath, next to the [place] wire, and with the use of a regular glidewire, was utilized to select the outflow origin of the hypertrophied gastric varix. The Rosen wire was then removed and over a coiled Amplatz wire, the vascular sheath was advanced into the outflow of the splenorenal shunt. Contrast injection confirmed appropriate positioning. Initially a Merci balloon, and ultimately, a 25 cm [place] scientific occlusion balloon was inflated and pulled caudal to the outflow confluence of the splenorenal shunt. With the outflow of the shunt occluded, multiple contrast and CO2 venograms were performed in various obliquities to identify the anatomy of the hypertrophied gastric varices. Next, a high-flow micro catheter was advanced through the occlusion balloon into the caudal aspect of the markedly hypertrophied shunt. Multiple CO2 an contrast venograms were again performed to identify the anatomy of  the hypertrophied gastric varices and to identify the inflow of a gastric varices. At this time, sclerosant was administered (in a mixture of 2 cc of Lipiodol, 4 cc of STS and 6 cc of air) under fluoroscopic guidance. Multiple spot fluoroscopic and radiographic images were obtained during the sclerosant administration. Given the patulous size of the outflow of the splenorenal shunt, the decision was made to leave the occlusion balloon in place overnight. The vascular sheath and occlusion balloon were secured at the right groin entrance site with interrupted suture and Tegaderm. Dressings were placed. The patient tolerated the procedure well and remained hemodynamically stable throughout the procedure. FINDINGS: Initial left venogram demonstrates the outflow of markedly hypertrophied splenorenal shunt as was demonstrated on preceding BRTO protocol abdominal CT. Selective venograms demonstrates a markedly hypertrophied splenorenal shunt draining multiple markedly enlarged gastric varices. CO2 venogram adequately demonstrates the inflow of the gastric varices from the splenic vein. Note was made a solitary tiny right inferior phrenic escape vein. Following fluoroscopic guided sclerosant administration, the sclerosant appears to be contained within near the entirety of the gastric varices and splenorenal shunt without evidence of spillage into the splenic or portal veins and without evidence of extravasation. IMPRESSION: Technically successful BRTO procedure for bleeding gastric varices. PLAN: - The patient will return to the interventional radiology suite tomorrow morning for fluoroscopic guided removal of occlusion balloon an vascular sheath. - Postprocedural BRTO protocol abdominal CT will be obtained on the morning of 3/18. Electronically Signed   By: Sandi Mariscal M.D.   On: 02/05/2016 10:50  Ir Fluoro Rm 30-60 Min  02/05/2016  CLINICAL DATA:  Upper GI bleed, status post gastric varicenal sclerosant embolization.  Occlusion balloon was left intact and inflated overnight postprocedure. No evidence of continued upper GI bleed. EXAM: IR FLOURO RM 0-60 MIN COMPARISON:  Previous day's exam TECHNIQUE: The right femoral venous sheath and catheter were prepped and draped in usual sterile fashion. Maximal barrier sterile technique was utilized including caps, mask, sterile gowns, sterile gloves, sterile drape, hand hygiene and skin antiseptic. Fluoroscopic inspection of the gastric varices showed stable appearance of the opacified sclerosis a material. The occlusion balloon remains inflated. Under fluoroscopic observation, the occlusion balloon was slowly deflated. There is no change in orientation or position of the opacified sclerosant material. The occlusion balloon catheter was retracted. The sclerosant material was stable in appearance. The venous sheath was removed and hemostasis achieved at the site. The patient tolerated the procedure well. IMPRESSION: 1. Stable position orientation of gastric variceal sclerosant material. The occlusion balloon and sheath were removed without incident. Electronically Signed   By: Lucrezia Europe M.D.   On: 02/05/2016 13:21   Ir US Guide Vasc Access Right  02/05/2016  CLINICAL DATA:  History of alcoholic cirrhosis, now with bleeding gastric varices. Patient presents for BRT0 of gastric varices. EXAM: 1. ULTRASOUND GUIDANCE FOR VENOUS ACCESS 2. LEFT RENAL VENOGRAM 3. SELECTIVE GASTRIC VARICEAL VENOGRAM AND PERCUTANEOUS SCLEROSANT EMBOLIZATION COMPARISON:  BRTO protocol CT- 01/24/2016 MEDICATIONS: None ANESTHESIA/SEDATION: General - as administered by the Anesthesia department CONTRAST:  100 cc Omnipaque 300 FLUOROSCOPY TIME:  Fluoroscopy Time: 33 minutes 54 seconds (3,624 mGy). COMPLICATIONS: None immediate. PROCEDURE: Informed written consent was obtained from the patient's wife after a thorough discussion of the procedural risks, benefits and alternatives. All questions were addressed. Maximal  Sterile Barrier Technique was utilized including caps, mask, sterile gowns, sterile gloves, sterile drape, hand hygiene and skin antiseptic. A timeout was performed prior to the initiation of the procedure. The right groin was prepped and draped in usual sterile fashion. Under direct ultrasound guidance, the right common femoral vein was accessed with a micropuncture kit. An ultrasound image was saved for procedural documentation purposes. Over a Bentson wire, the track was dilated allowing for placement of a 10 French TIPS sheath to the level of the mid IVC under intermittent fluoroscopic guidance. A Cobra catheter was utilized to select the left renal vein and a left renal venogram was performed. A Rosen wire was advanced into the caudal pole of the left kidney as the access sheath was advanced to the origin of left renal vein. Next, a 4 French angled glide catheter was cannulated within the sheath, next to the [place] wire, and with the use of a regular glidewire, was utilized to select the outflow origin of the hypertrophied gastric varix. The Rosen wire was then removed and over a coiled Amplatz wire, the vascular sheath was advanced into the outflow of the splenorenal shunt. Contrast injection confirmed appropriate positioning. Initially a Merci balloon, and ultimately, a 30 cm [place] scientific occlusion balloon was inflated and pulled caudal to the outflow confluence of the splenorenal shunt. With the outflow of the shunt occluded, multiple contrast and CO2 venograms were performed in various obliquities to identify the anatomy of the hypertrophied gastric varices. Next, a high-flow micro catheter was advanced through the occlusion balloon into the caudal aspect of the markedly hypertrophied shunt. Multiple CO2 an contrast venograms were again performed to identify the anatomy of the hypertrophied gastric varices and to identify the inflow  of a gastric varices. At this time, sclerosant was administered (in a  mixture of 2 cc of Lipiodol, 4 cc of STS and 6 cc of air) under fluoroscopic guidance. Multiple spot fluoroscopic and radiographic images were obtained during the sclerosant administration. Given the patulous size of the outflow of the splenorenal shunt, the decision was made to leave the occlusion balloon in place overnight. The vascular sheath and occlusion balloon were secured at the right groin entrance site with interrupted suture and Tegaderm. Dressings were placed. The patient tolerated the procedure well and remained hemodynamically stable throughout the procedure. FINDINGS: Initial left venogram demonstrates the outflow of markedly hypertrophied splenorenal shunt as was demonstrated on preceding BRTO protocol abdominal CT. Selective venograms demonstrates a markedly hypertrophied splenorenal shunt draining multiple markedly enlarged gastric varices. CO2 venogram adequately demonstrates the inflow of the gastric varices from the splenic vein. Note was made a solitary tiny right inferior phrenic escape vein. Following fluoroscopic guided sclerosant administration, the sclerosant appears to be contained within near the entirety of the gastric varices and splenorenal shunt without evidence of spillage into the splenic or portal veins and without evidence of extravasation. IMPRESSION: Technically successful BRTO procedure for bleeding gastric varices. PLAN: - The patient will return to the interventional radiology suite tomorrow morning for fluoroscopic guided removal of occlusion balloon an vascular sheath. - Postprocedural BRTO protocol abdominal CT will be obtained on the morning of 3/18. Electronically Signed   By: Sandi Mariscal M.D.   On: 02/05/2016 10:50   Dg Chest Port 1 View  02/07/2016  CLINICAL DATA:  Respiratory failure EXAM: PORTABLE CHEST 1 VIEW COMPARISON:  February 06, 2016 FINDINGS: There is patchy bibasilar atelectasis, stable. No new opacity. Heart is upper normal in size with pulmonary  vascularity within normal limits. No adenopathy. No bone lesions. IMPRESSION: Patchy bibasilar atelectasis, stable. No new opacity. No change in cardiac silhouette. Electronically Signed   By: Lowella Grip III M.D.   On: 02/07/2016 07:47   Dg Chest Port 1 View  02/06/2016  CLINICAL DATA:  Acute respiratory failure EXAM: PORTABLE CHEST 1 VIEW COMPARISON:  Chest radiograph from one day prior. FINDINGS: Interval extubation. Stable cardiomediastinal silhouette with normal heart size. No pneumothorax. No pleural effusion. No pulmonary edema. Curvilinear bibasilar lung opacities, significantly improved. IMPRESSION: Bibasilar curvilinear lung opacities, significantly improved, favor atelectasis. Electronically Signed   By: Ilona Sorrel M.D.   On: 02/06/2016 10:00   Portable Chest Xray  02/05/2016  CLINICAL DATA:  Encounter for intubation EXAM: PORTABLE CHEST 1 VIEW COMPARISON:  01/01/2016 FINDINGS: Endotracheal tube tip is just below the clavicular heads. Vascular pedicle widening which is newly seen. Normal heart size for technique. There is a hazy right basilar opacity. Streaky left basilar opacity suggesting atelectasis. No evidence of pneumothorax. No pulmonary edema. Newly seen embolic agent in the left upper quadrant. IMPRESSION: 1. Endotracheal tube in good position. 2. Right basilar opacity is likely combination of atelectasis and layering effusion. 3. Left basilar atelectasis. Electronically Signed   By: Monte Fantasia M.D.   On: 02/05/2016 02:06   [place] Guide Roadmapping  02/05/2016  CLINICAL DATA:  History of alcoholic cirrhosis, now with bleeding gastric varices. Patient presents for BRT0 of gastric varices. EXAM: 1. ULTRASOUND GUIDANCE FOR VENOUS ACCESS 2. LEFT RENAL VENOGRAM 3. SELECTIVE GASTRIC VARICEAL VENOGRAM AND PERCUTANEOUS SCLEROSANT EMBOLIZATION COMPARISON:  BRTO protocol CT- 01/24/2016 MEDICATIONS: None ANESTHESIA/SEDATION: General - as administered  by the Anesthesia department CONTRAST:  100 cc Omnipaque 300 FLUOROSCOPY TIME:  Fluoroscopy Time: 33 minutes 54 seconds (3,624 mGy). COMPLICATIONS: None immediate. PROCEDURE: Informed written consent was obtained from the patient's wife after a thorough discussion of the procedural risks, benefits and alternatives. All questions were addressed. Maximal Sterile Barrier Technique was utilized including caps, mask, sterile gowns, sterile gloves, sterile drape, hand hygiene and skin antiseptic. A timeout was performed prior to the initiation of the procedure. The right groin was prepped and draped in usual sterile fashion. Under direct ultrasound guidance, the right common femoral vein was accessed with a micropuncture kit. An ultrasound image was saved for procedural documentation purposes. Over a Bentson wire, the track was dilated allowing for placement of a 10 French TIPS sheath to the level of the mid IVC under intermittent fluoroscopic guidance. A Cobra catheter was utilized to select the left renal vein and a left renal venogram was performed. A Rosen wire was advanced into the caudal pole of the left kidney as the access sheath was advanced to the origin of left renal vein. Next, a 4 French angled glide catheter was cannulated within the sheath, next to the [place] wire, and with the use of a regular glidewire, was utilized to select the outflow origin of the hypertrophied gastric varix. The Rosen wire was then removed and over a coiled Amplatz wire, the vascular sheath was advanced into the outflow of the splenorenal shunt. Contrast injection confirmed appropriate positioning. Initially a Merci balloon, and ultimately, a 24 cm Boston scientific occlusion balloon was inflated and pulled caudal to the outflow confluence of the splenorenal shunt. With the outflow of the shunt occluded, multiple contrast and CO2 venograms were performed in various obliquities to identify the anatomy of the hypertrophied gastric  varices. Next, a high-flow micro catheter was advanced through the occlusion balloon into the caudal aspect of the markedly hypertrophied shunt. Multiple CO2 an contrast venograms were again performed to identify the anatomy of the hypertrophied gastric varices and to identify the inflow of a gastric varices. At this time, sclerosant was administered (in a mixture of 2 cc of Lipiodol, 4 cc of STS and 6 cc of air) under fluoroscopic guidance. Multiple spot fluoroscopic and radiographic images were obtained during the sclerosant administration. Given the patulous size of the outflow of the splenorenal shunt, the decision was made to leave the occlusion balloon in place overnight. The vascular sheath and occlusion balloon were secured at the right groin entrance site with interrupted suture and Tegaderm. Dressings were placed. The patient tolerated the procedure well and remained hemodynamically stable throughout the procedure. FINDINGS: Initial left venogram demonstrates the outflow of markedly hypertrophied splenorenal shunt as was demonstrated on preceding BRTO protocol abdominal CT. Selective venograms demonstrates a markedly hypertrophied splenorenal shunt draining multiple markedly enlarged gastric varices. CO2 venogram adequately demonstrates the inflow of the gastric varices from the splenic vein. Note was made a solitary tiny right inferior phrenic escape vein. Following fluoroscopic guided sclerosant administration, the sclerosant appears to be contained within near the entirety of the gastric varices and splenorenal shunt without evidence of spillage into the splenic or portal veins and without evidence of extravasation. IMPRESSION: Technically successful BRTO procedure for bleeding gastric varices. PLAN: - The patient will return to the interventional radiology suite tomorrow morning for fluoroscopic guided removal of occlusion balloon an vascular sheath. - Postprocedural BRTO protocol abdominal CT will be  obtained on the morning of 3/18. Electronically Signed   By: Sandi Mariscal M.D.   On: 02/05/2016 10:50  Labs:  CBC:  Recent Labs  02/06/16 0051 02/06/16 0735 02/06/16 1831 02/07/16 0704  WBC 12.5* 9.0 7.9 6.1  HGB 8.6* 8.6* 8.2* 7.8*  HCT 25.9* 24.8* 25.3* 23.4*  PLT 70* 63* 52* 49*    COAGS:  Recent Labs  06/02/15 0715 02/04/16 1840 02/05/16 0111 02/07/16 0704  INR 1.35 1.48 1.45 1.43  APTT  --   --   --  30    BMP:  Recent Labs  02/05/16 0111 02/05/16 1702 02/06/16 0735 02/07/16 0704  NA 150* 151* 146* 139  K 6.0* 4.8 4.9 3.8  CL 117* 118* 117* 108  CO2 _0 GLUCOSE 136* 139* 126* 123*  BUN 38* 44* 36* 21*  CALCIUM 7.7* 7.7* 7.6* 7.5*  CREATININE 1.25* 1.42* 1.24 1.06  GFRNONAA >60 53* >60 >60  GFRAA >60 >60 >60 >60    LIVER FUNCTION TESTS:  Recent Labs  10/02/15 2337 01/02/16 1212 02/04/16 1845 02/06/16 0735  BILITOT 1.7* 3.2* 1.5* 1.8*  AST 106* 68* 54* 90*  ALT 83* 55 51 58  ALKPHOS 71 64 54 61  PROT 6.5 6.0* 5.2* 5.3*  ALBUMIN 3.2* 2.8* 2.6* 2.7*    Assessment and Plan: 1. UGI bleed secondary to gastric varices, s/p BRTO by [facility] on 3/16 with removal of balloon on 3/17. -site is clean -no further bleeding -patient otherwise stable and doing well -will need follow up CT scan today to evaluate varices.  Electronically Signed: Henreitta Cea 02/07/2016, 9:50 AM   I spent a total of 15 Minutes at the the patient's bedside AND on the patient's hospital floor or unit, greater than 50% of which was counseling/coordinating care for UGI bleed, s/p BRTO

## 2016-02-08 ENCOUNTER — Other Ambulatory Visit: Payer: Self-pay | Admitting: *Deleted

## 2016-02-08 ENCOUNTER — Inpatient Hospital Stay (HOSPITAL_COMMUNITY): Payer: Medicaid Other

## 2016-02-08 ENCOUNTER — Encounter (HOSPITAL_COMMUNITY): Payer: Self-pay | Admitting: Radiology

## 2016-02-08 DIAGNOSIS — I864 Gastric varices: Secondary | ICD-10-CM

## 2016-02-08 LAB — TRIGLYCERIDES: TRIGLYCERIDES: 83 mg/dL (ref <150)

## 2016-02-08 LAB — GLUCOSE, CAPILLARY
GLUCOSE-CAPILLARY: 115 mg/dL — AB (ref 65–99)
GLUCOSE-CAPILLARY: 113 mg/dL — AB (ref 65–99)
GLUCOSE-CAPILLARY: 118 mg/dL — AB (ref 65–99)
GLUCOSE-CAPILLARY: 131 mg/dL — AB (ref 65–99)
Glucose-Capillary: 104 mg/dL — ABNORMAL HIGH (ref 65–99)
Glucose-Capillary: 109 mg/dL — ABNORMAL HIGH (ref 65–99)
Glucose-Capillary: 109 mg/dL — ABNORMAL HIGH (ref 65–99)
Glucose-Capillary: 160 mg/dL — ABNORMAL HIGH (ref 65–99)

## 2016-02-08 LAB — TYPE AND SCREEN
ABO/RH(D): A NEG
Antibody Screen: NEGATIVE
UNIT DIVISION: 0
UNIT DIVISION: 0
UNIT DIVISION: 0
Unit division: 0

## 2016-02-08 LAB — CBC
HEMATOCRIT: 27.5 % — AB (ref 39.0–52.0)
HEMOGLOBIN: 9 g/dL — AB (ref 13.0–17.0)
MCH: 30.6 pg (ref 26.0–34.0)
MCHC: 32.7 g/dL (ref 30.0–36.0)
MCV: 93.5 fL (ref 78.0–100.0)
Platelets: 41 10*3/uL — ABNORMAL LOW (ref 150–400)
RBC: 2.94 MIL/uL — ABNORMAL LOW (ref 4.22–5.81)
RDW: 17.3 % — ABNORMAL HIGH (ref 11.5–15.5)
WBC: 7.8 10*3/uL (ref 4.0–10.5)

## 2016-02-08 LAB — BASIC METABOLIC PANEL
Anion gap: 7 (ref 5–15)
BUN: 17 mg/dL (ref 6–20)
CO2: 24 mmol/L (ref 22–32)
CREATININE: 1.05 mg/dL (ref 0.61–1.24)
Calcium: 7.7 mg/dL — ABNORMAL LOW (ref 8.9–10.3)
Chloride: 106 mmol/L (ref 101–111)
GFR calc Af Amer: >60 mL/min (ref >60)
GLUCOSE: 129 mg/dL — AB (ref 65–99)
POTASSIUM: 4.2 mmol/L (ref 3.5–5.1)
Sodium: 137 mmol/L (ref 135–145)

## 2016-02-08 MED ORDER — ATORVASTATIN CALCIUM 10 MG PO TABS
10.0000 mg | ORAL_TABLET | Freq: Every evening | ORAL | Status: DC
Start: 2016-02-08 — End: 2016-02-13
  Administered 2016-02-08 – 2016-02-12 (×5): 10 mg via ORAL
  Filled 2016-02-08 (×5): qty 1

## 2016-02-08 MED ORDER — ONDANSETRON HCL 4 MG/2ML IJ SOLN
4.0000 mg | Freq: Four times a day (QID) | INTRAMUSCULAR | Status: DC | PRN
  Administered 2016-02-08: 4 mg via INTRAVENOUS
  Filled 2016-02-08: qty 2

## 2016-02-08 MED ORDER — FUROSEMIDE 20 MG PO TABS
20.0000 mg | ORAL_TABLET | Freq: Every day | ORAL | Status: DC
  Administered 2016-02-09 – 2016-02-10 (×2): 20 mg via ORAL
  Filled 2016-02-08 (×2): qty 1

## 2016-02-08 MED ORDER — ISOSORBIDE MONONITRATE ER 30 MG PO TB24
30.0000 mg | ORAL_TABLET | Freq: Every day | ORAL | Status: DC
  Administered 2016-02-08 – 2016-02-13 (×6): 30 mg via ORAL
  Filled 2016-02-08 (×6): qty 1

## 2016-02-08 MED ORDER — OXYCODONE HCL 5 MG PO TABS
5.0000 mg | ORAL_TABLET | ORAL | Status: DC | PRN
  Administered 2016-02-08 (×2): 10 mg via ORAL
  Administered 2016-02-09 – 2016-02-10 (×3): 5 mg via ORAL
  Administered 2016-02-10 – 2016-02-12 (×6): 10 mg via ORAL
  Filled 2016-02-08 (×2): qty 2
  Filled 2016-02-08: qty 1
  Filled 2016-02-08: qty 2
  Filled 2016-02-08: qty 1
  Filled 2016-02-08 (×4): qty 2
  Filled 2016-02-08: qty 1
  Filled 2016-02-08: qty 2

## 2016-02-08 MED ORDER — FUROSEMIDE 10 MG/ML IJ SOLN
40.0000 mg | Freq: Once | INTRAMUSCULAR | Status: AC
Start: 2016-02-08 — End: 2016-02-08
  Administered 2016-02-08: 40 mg via INTRAVENOUS
  Filled 2016-02-08: qty 4

## 2016-02-08 MED ORDER — SPIRONOLACTONE 25 MG PO TABS
12.5000 mg | ORAL_TABLET | Freq: Every day | ORAL | Status: DC
  Administered 2016-02-08 – 2016-02-10 (×3): 12.5 mg via ORAL
  Filled 2016-02-08 (×3): qty 1

## 2016-02-08 MED ORDER — CLOPIDOGREL BISULFATE 75 MG PO TABS
75.0000 mg | ORAL_TABLET | Freq: Every day | ORAL | Status: DC
  Administered 2016-02-08 – 2016-02-10 (×3): 75 mg via ORAL
  Filled 2016-02-08 (×4): qty 1

## 2016-02-08 MED ORDER — IOHEXOL 300 MG/ML  SOLN
100.0000 mL | Freq: Once | INTRAMUSCULAR | Status: AC | PRN
  Administered 2016-02-08: 100 mL via INTRAVENOUS

## 2016-02-08 MED ORDER — BUPROPION HCL ER (XL) 150 MG PO TB24
150.0000 mg | ORAL_TABLET | Freq: Every day | ORAL | Status: DC
  Administered 2016-02-08 – 2016-02-13 (×6): 150 mg via ORAL
  Filled 2016-02-08 (×6): qty 1

## 2016-02-08 MED ORDER — FENTANYL CITRATE (PF) 100 MCG/2ML IJ SOLN: 25.0000 ug | INTRAMUSCULAR | Status: DC | PRN

## 2016-02-08 MED ORDER — METOPROLOL SUCCINATE ER 25 MG PO TB24
12.5000 mg | ORAL_TABLET | Freq: Every day | ORAL | Status: DC
  Administered 2016-02-08: 12.5 mg via ORAL
  Administered 2016-02-09: 09:00:00 via ORAL
  Administered 2016-02-10 – 2016-02-13 (×4): 12.5 mg via ORAL
  Filled 2016-02-08 (×6): qty 1

## 2016-02-08 MED ORDER — ASPIRIN EC 81 MG PO TBEC
81.0000 mg | DELAYED_RELEASE_TABLET | Freq: Every day | ORAL | Status: DC
  Administered 2016-02-08 – 2016-02-10 (×3): 81 mg via ORAL
  Filled 2016-02-08 (×5): qty 1

## 2016-02-08 NOTE — Progress Notes (Signed)
Follow up CT from BRTO completed today.  This has been reviewed by Dr. Pascal Lux and Dr. Laurence Ferrari.  The scan shows improvement in the varices that were addressed. The patient is doing well from this procedure.  He would benefit from GI evaluation in the hospital for management of his ascites and liver disease.  He does have increased ascites on this follow up CT scan as expected given the procedure and more work for the liver.  Management of his ascites by GI would be beneficial, likely with diuretics first.  If the patient becomes symptomatic then a paracentesis could be done.  He will ultimately need chronic GI follow up.  He will follow up with Dr. Pascal Lux in 1 month with another repeat CT scan.  Our office will set this up and call the patient with this appointment for his scan and office visit.  Amorina Doerr E 2:25 PM 02/08/2016

## 2016-02-08 NOTE — Care Management Important Message (Signed)
Important Message  Patient Details  Name: Vincent Ochoa MRN: [id number] Date of Birth: Oct 18, 1956   Medicare Important Message Given:  Yes    Erenest Rasher, RN 02/08/2016, 11:02 AM

## 2016-02-08 NOTE — Progress Notes (Signed)
[facility] TEAM 1 - Stepdown/ICU TEAM PROGRESS NOTE  Vincent Ochoa [id number] DOB: 1956/01/06 DOA: 02/04/2016 PCP: Clearance Coots, MD  Admit HPI / Brief Narrative: 60 y.o. male with Hx of cirrhosis who presented to ED at [facility] on 03/15 with the acute onset of hematemesis. On arrival to the ED he was hypotensive. Per his wife he vomited so much blood it filled 2 small trash cans. Blood was dark in color without any coffee grounds or clots. Pt was on plavix and ASA as outpatient which he takes for cardiac stent. He had not had any similar symptoms in the past and denied heavy NSAID use. He drinks minimally now but does apparently have hx of heavy ETOH use back in the 90's.  He was admitted initially to [facility].    Significant Events: GI consulted 03/15 for hematemesis. 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 Also required PRBC transfusion. Bleeding recurred AM 03/16. 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 Pt was subsequently transferred to [facility] ICU for procedure.  3/16 transferred to [facility] for BRTO - intubated 3/17 extubated   HPI/Subjective: The patient is sleeping comfortably using his CPAP for his nap.  He easily awakens.  He denies chest pain shortness breath fevers chills nausea or vomiting.  He is anxious to begin getting up and moving around.  Assessment/Plan:  Hematemesis - bleeding gastric varices  S/p successful BRTO early AM 3/17 per IR - was tx empirically w/ PPI and octreotide gtts - f/u CT abdom suggests no acute complications - no evidence of ongoing bleeding presently    EGD at [facility] 3/15: Findings: - Esophagus. One column of small varix seen. Esophagitis and stricture not identified. - Gastric lumen. Old blood seen. 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 Mild PHG. - Duodenum. Bulb to D2  normal. Impression:  1. Recent upper GI bleeding from gastric varices. High risk for rebleeding.  Cirrhosis - NASH + remote EtOH  Resume maintenance medical tx - follow  Acute blood loss anemia Hemoglobin stable/climbing - follow in serial fashion w/ resumption of ASA + Plavix   Chronic thrombocytopenia Followed by Dr. 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  Code Status: FULL Family Communication: spoke w/ wife at bedside  Disposition Plan: SDU - probable transfer to med bed in AM if stable   Consultants: PCCM Hematology  IR  Antibiotics: Ceftriaxone 3/16 > 3/20  DVT prophylaxis: SCDs  Objective: Blood pressure 122/61, pulse 84, temperature 99.5 F (37.5 C), temperature source Axillary, resp. rate 14, weight 146.4 kg (322 lb 12.1 oz), SpO2 91 %.  Intake/Output Summary (Last 24 hours) at 02/08/16 1134 Last data filed at 02/08/16 0700  Gross per 24 hour  Intake  895 ml  Output    900 ml  Net     -5 ml   Exam: General: No acute respiratory distress Lungs: Clear to auscultation bilaterally without wheezes or crackles Cardiovascular: Regular rate and rhythm without murmur gallop or rub normal S1 and S2 Abdomen: Nontender, distended but soft, bowel sounds positive, no rebound, no appreciable mass Extremities: No significant cyanosis, clubbing, or edema bilateral  lower extremities  Data Reviewed: Basic Metabolic Panel:  Recent Labs Lab 02/04/16 1845 02/05/16 0111 02/05/16 1702 02/06/16 0735 02/07/16 0704 02/08/16 0126  NA 151* 150* 151* 146* 139 137  K 4.4 6.0* 4.8 4.9 3.8 4.2  CL 118* 117* 118* 117* 108 106  CO2 27 23 23 22 24 24   GLUCOSE 136* 136* 139* 126* 123* 129*  BUN 37* 38* 44* 36* 21* 17  CREATININE 1.16 1.25* 1.42* 1.24 1.06 1.05  CALCIUM 8.0* 7.7* 7.7* 7.6* 7.5* 7.7*  MG 2.0 2.2  --   --  2.0  --   PHOS 3.1 4.9*  --   --  2.4*  --     CBC:  Recent Labs Lab 02/06/16 0051 02/06/16 0735 02/06/16 1831 02/07/16 0704 02/08/16 0126  WBC 12.5* 9.0 7.9 6.1 7.8  NEUTROABS  --  5.5  --   --   --   HGB 8.6* 8.6* 8.2* 7.8* 9.0*  HCT 25.9* 24.8* 25.3* 23.4* 27.5*  MCV 90.9 90.8 93.7 94.0 93.5  PLT 70* 63* 52* 49* 41*    Liver Function Tests:  Recent Labs Lab 02/04/16 1845 02/06/16 0735  AST 54* 90*  ALT 51 58  ALKPHOS 54 61  BILITOT 1.5* 1.8*  PROT 5.2* 5.3*  ALBUMIN 2.6* 2.7*    Recent Labs Lab 02/04/16 1850  AMMONIA 84*    Coags:  Recent Labs Lab 02/04/16 1840 02/05/16 0111 02/07/16 0704  INR 1.48 1.45 1.43    Recent Labs Lab 02/07/16 0704  APTT 30    Cardiac Enzymes:  Recent Labs Lab 02/04/16 1845 02/05/16 0111 02/05/16 1702  TROPONINI 0.03 <0.03 0.05*    CBG:  Recent Labs Lab 02/07/16 1629 02/07/16 2028 02/07/16 2359 02/08/16 0429 02/08/16 0740  GLUCAP 107* 126* 104* 109* 115*    Recent Results (from the past 240 hour(s))  MRSA PCR Screening     Status: None   Collection Time: 02/04/16  5:41 PM  Result Value Ref Range Status   MRSA by PCR NEGATIVE NEGATIVE Final    Comment:        The GeneXpert MRSA Assay (FDA approved for NASAL specimens only), is one component of a comprehensive MRSA colonization surveillance program. It is not intended to diagnose MRSA infection nor to guide or monitor treatment for MRSA infections.      Studies:   Recent x-ray studies  have been reviewed in detail by the Attending Physician  Scheduled Meds:  Scheduled Meds: . antiseptic oral rinse  7 mL Mouth Rinse BID  . cefTRIAXone (ROCEPHIN)  IV  2 g Intravenous Q24H  . insulin aspart  0-15 Units Subcutaneous 6 times per day  . pantoprazole  40 mg Oral BID    Time spent on care of this patient: 35 mins   Hearl Heikes T , MD   [facility] Office  [phone] Pager - Text Page per Shea Evans as per below:  On-Call/Text Page:      [email or web address]      password [email or web address]  If 7PM-7AM, please contact night-coverage [email or web address] Password [facility] 02/08/2016, 11:34 AM  LOS: 4 days

## 2016-02-08 NOTE — Care Management Note (Signed)
Case Management Note  Patient Details  Name: Vincent Ochoa MRN: [id number] Date of Birth: 01-17-1956  Subjective/Objective:       GI bleed, chronic thrombocytopenia from liver disease              Action/Plan: Discharge Planning:   NCM spoke to pt at bedside. Has CPAP at home, but not oxygen. States he had [facility] in the past and would like if HH needed at dc. Lives at home with wife, Vincent Ochoa # [id number]. States he has Medicare and Medicaid. NCM requested insurance card. Explained will send to admissions. States he will speak to wife when she comes and make sure she brings his insurance cards.   Possible dc needs: Assess oxygen for home Home Health   PCP-SCHMITZ, Enid Baas MD  Expected Discharge Date:                  Expected Discharge Plan:  [place]  In-House Referral:  NA  Discharge planning Services  CM Consult  Post Acute Care Choice:  Home Health Choice offered to:  Patient   Status of Service:  In process, will continue to follow  Medicare Important Message Given:  Yes Date Medicare IM Given:    Medicare IM give by:    Date Additional Medicare IM Given:    Additional Medicare Important Message give by:     If discussed at [place] of Stay Meetings, dates discussed:    Additional Comments:  Erenest Rasher, RN 02/08/2016, 10:56 AM

## 2016-02-08 NOTE — Progress Notes (Signed)
RT came to assess patient. Patient was on CPAP. Patient tolerating well. RT will continue to monitor as needed.

## 2016-02-08 NOTE — Discharge Instructions (Signed)
Angiogram, Care After °Refer to this sheet in the next few weeks. These instructions provide you with information about caring for yourself after your procedure. Your health care provider may also give you more specific instructions. Your treatment has been planned according to current medical practices, but problems sometimes occur. Call your health care provider if you have any problems or questions after your procedure. °WHAT TO EXPECT AFTER THE PROCEDURE °After your procedure, it is typical to have the following: °· Bruising at the catheter insertion site that usually fades within 1-2 weeks. °· Blood collecting in the tissue (hematoma) that may be painful to the touch. It should usually decrease in size and tenderness within 1-2 weeks. °HOME CARE INSTRUCTIONS °· Take medicines only as directed by your health care provider. °· You may shower 24-48 hours after the procedure or as directed by your health care provider. Remove the bandage (dressing) and gently wash the site with plain soap and water. Pat the area dry with a clean towel. Do not rub the site, because this may cause bleeding. °· Do not take baths, swim, or use a hot tub until your health care provider approves. °· Check your insertion site every day for redness, swelling, or drainage. °· Do not apply powder or lotion to the site. °· Do not lift over 10 lb (4.5 kg) for 5 days after your procedure or as directed by your health care provider. °· Ask your health care provider when it is okay to: °¨ Return to work or school. °¨ Resume usual physical activities or sports. °¨ Resume sexual activity. °· Do not drive home if you are discharged the same day as the procedure. Have someone else drive you. °· You may drive 24 hours after the procedure unless otherwise instructed by your health care provider. °· Do not operate machinery or power tools for 24 hours after the procedure or as directed by your health care provider. °· If your procedure was done as an  outpatient procedure, which means that you went home the same day as your procedure, a responsible adult should be with you for the first 24 hours after you arrive home. °· Keep all follow-up visits as directed by your health care provider. This is important. °SEEK MEDICAL CARE IF: °· You have a fever. °· You have chills. °· You have increased bleeding from the catheter insertion site. Hold pressure on the site. °SEEK IMMEDIATE MEDICAL CARE IF: °· You have unusual pain at the catheter insertion site. °· You have redness, warmth, or swelling at the catheter insertion site. °· You have drainage (other than a small amount of blood on the dressing) from the catheter insertion site. °· The catheter insertion site is bleeding, and the bleeding does not stop after 30 minutes of holding steady pressure on the site. °· The area near or just beyond the catheter insertion site becomes pale, cool, tingly, or numb. °  °This information is not intended to replace advice given to you by your health care provider. Make sure you discuss any questions you have with your health care provider. °  °Document Released: 05/26/2005 Document Revised: 11/28/2014 Document Reviewed: 04/10/2013 °Elsevier Interactive Patient Education ©2016 [employer] ° °

## 2016-02-09 ENCOUNTER — Other Ambulatory Visit: Payer: Self-pay | Admitting: Radiology

## 2016-02-09 DIAGNOSIS — Z9989 Dependence on other enabling machines and devices: Secondary | ICD-10-CM

## 2016-02-09 DIAGNOSIS — I8501 Esophageal varices with bleeding: Secondary | ICD-10-CM

## 2016-02-09 DIAGNOSIS — I864 Gastric varices: Secondary | ICD-10-CM | POA: Diagnosis present

## 2016-02-09 DIAGNOSIS — I480 Paroxysmal atrial fibrillation: Secondary | ICD-10-CM

## 2016-02-09 DIAGNOSIS — Z720 Tobacco use: Secondary | ICD-10-CM

## 2016-02-09 DIAGNOSIS — G4733 Obstructive sleep apnea (adult) (pediatric): Secondary | ICD-10-CM | POA: Diagnosis present

## 2016-02-09 DIAGNOSIS — K922 Gastrointestinal hemorrhage, unspecified: Secondary | ICD-10-CM

## 2016-02-09 DIAGNOSIS — K766 Portal hypertension: Secondary | ICD-10-CM

## 2016-02-09 LAB — CBC
HEMATOCRIT: 24.5 % — AB (ref 39.0–52.0)
HEMOGLOBIN: 8.1 g/dL — AB (ref 13.0–17.0)
MCH: 30.1 pg (ref 26.0–34.0)
MCHC: 33.1 g/dL (ref 30.0–36.0)
MCV: 91.1 fL (ref 78.0–100.0)
Platelets: 49 10*3/uL — ABNORMAL LOW (ref 150–400)
RBC: 2.69 MIL/uL — ABNORMAL LOW (ref 4.22–5.81)
RDW: 17.1 % — ABNORMAL HIGH (ref 11.5–15.5)
WBC: 9.1 10*3/uL (ref 4.0–10.5)

## 2016-02-09 LAB — COMPREHENSIVE METABOLIC PANEL
ALT: 52 U/L (ref 17–63)
ANION GAP: 8 (ref 5–15)
AST: 55 U/L — ABNORMAL HIGH (ref 15–41)
Albumin: 2.4 g/dL — ABNORMAL LOW (ref 3.5–5.0)
Alkaline Phosphatase: 66 U/L (ref 38–126)
BUN: 25 mg/dL — ABNORMAL HIGH (ref 6–20)
CHLORIDE: 104 mmol/L (ref 101–111)
CO2: 22 mmol/L (ref 22–32)
Calcium: 7.5 mg/dL — ABNORMAL LOW (ref 8.9–10.3)
Creatinine, Ser: 1.16 mg/dL (ref 0.61–1.24)
GFR calc non Af Amer: >60 mL/min (ref >60)
Glucose, Bld: 97 mg/dL (ref 65–99)
POTASSIUM: 4.2 mmol/L (ref 3.5–5.1)
SODIUM: 134 mmol/L — AB (ref 135–145)
Total Bilirubin: 2 mg/dL — ABNORMAL HIGH (ref 0.3–1.2)
Total Protein: 5.5 g/dL — ABNORMAL LOW (ref 6.5–8.1)

## 2016-02-09 LAB — GLUCOSE, CAPILLARY: Glucose-Capillary: 111 mg/dL — ABNORMAL HIGH (ref 65–99)

## 2016-02-09 NOTE — Progress Notes (Signed)
Vincent Ochoa  Vincent Ochoa [id number] DOB: July 30, 1956 DOA: 02/04/2016 PCP: Clearance Coots, Vincent Ochoa  Admit HPI / Brief Narrative: 60 y.o. WM PMHx Vincent Ochoa Cirrhosis   Who presented to ED at [facility] on 03/15 with the acute onset of hematemesis. On arrival to the ED he was hypotensive. Per his wife he vomited so much blood it filled 2 small trash cans. Blood was dark in color without any coffee grounds or clots. Pt was on plavix and ASA as outpatient which he takes for cardiac stent. He had not had any similar symptoms in the past and denied heavy NSAID use. He drinks minimally now but does apparently have hx of heavy ETOH use back in the 90's. He was admitted initially to [facility].   HPI/Subjective: 3/1 A/O 4, NAD. States has not ambulated today. Has been up to bedside commode. States has occasional cramping in bilateral calves for no apparent reason.   Assessment/Plan: Hematemesis - bleeding gastric varices  S/p successful BRTO early AM 3/17 per IR - was tx empirically w/ PPI and octreotide gtts - f/u CT abdom suggests no acute complications - no evidence of ongoing bleeding presently  - S/P EGD at [facility]; large varices see results below - S/P BRTO  Results below  Cirrhosis - NASH + remote EtOH  Resume maintenance medical tx - follow  Acute blood loss anemia -Hemoglobin stable/climbing - follow in serial fashion w/ resumption of ASA + Plavix  -Transfuse for Hemoglobin<7  Chronic thrombocytopenia(  Baseline count40-70k) -Followed by Vincent. 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   Code Status: FULL Family Communication: no family present at time of exam Disposition Plan:  Discharge 3/22 if H/H stable    Consultants: Vincent Ochoa.PCCM  Vincent Ochoa   Vincent Ochoa, .IR  Procedure/Significant Events: 3/16 transferred to [facility] for BRTO - intubated 3/17 extubated  3/17 Balloon-occluded retrograde transvenous obliteration (BRTO)  3/15 EGD at [facility];- Esophagus. One column of small varix seen. - Gastric lumen. Old blood seen. 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 Mild PHG.   Culture  NA  Antibiotics: Ceftriaxone 3/16 > 3/20  DVT prophylaxis: SCD   Devices  NA   LINES / TUBES:   NA    Continuous Infusions:   Objective: VITAL SIGNS: Temp: 97.9 F (36.6 C) (03/21 1903) Temp Source: Axillary (03/21 1903) BP: 113/64 mmHg (03/21 1905) Pulse Rate: 75 (03/21 1610) SPO2; FIO2:   Intake/Output Summary (Last 24 hours) at 02/09/16 2001 Last data filed at 02/09/16 1906  Gross per 24 hour  Intake    960 ml  Output   1550 ml  Net   -590 ml     Exam: General:A/O 4, NAD, No acute respiratory distress Eyes: Negative headache, negative scleral hemorrhage ENT: Negative Runny nose, negative gingival bleeding, Neck:  Negative scars, masses, torticollis, lymphadenopathy, JVD Lungs: Clear to  auscultation bilaterally without wheezes or crackles Cardiovascular: Regular rate and rhythm without murmur gallop or rub normal S1 and S2 Abdomen: Morbidly obese, distended, positive soft, bowel sounds, no rebound, no ascites, no appreciable mass, no pain to palpation Extremities: No significant cyanosis, clubbing, positive bilateral lower extremity edema 2+ (chronic)  Psychiatric:  Negative depression, negative anxiety, negative fatigue,  negative mania Neurologic:  Cranial nerves II through XII intact, tongue/uvula midline, all extremities muscle strength 5/5, sensation intact throughout, negative dysarthria, negative expressive aphasia, negative receptive aphasia.   Data Reviewed: Basic Metabolic Panel:  Recent Labs Lab 02/04/16 1845 02/05/16 0111 02/05/16 1702 02/06/16 0735 02/07/16 0704 02/08/16 0126 02/09/16 0720  NA 151* 150* 151* 146* 139 137 134*  K 4.4 6.0* 4.8 4.9 3.8 4.2 4.2  CL 118* 117* 118* 117* 108 106 104  CO2 27 23 23 22 24 24 22   GLUCOSE 136* 136* 139* 126* 123* 129* 97  BUN 37* 38* 44* 36* 21* 17 25*  CREATININE 1.16 1.25* 1.42* 1.24 1.06 1.05 1.16  CALCIUM 8.0* 7.7* 7.7* 7.6* 7.5* 7.7* 7.5*  MG 2.0 2.2  --   --  2.0  --   --   PHOS 3.1 4.9*  --   --  2.4*  --   --    Liver Function Tests:  Recent Labs Lab 02/04/16 1845 02/06/16 0735 02/09/16 0720  AST 54* 90* 55*  ALT 51 58 52  ALKPHOS 54 61 66  BILITOT 1.5* 1.8* 2.0*  PROT 5.2* 5.3* 5.5*  ALBUMIN 2.6* 2.7* 2.4*   No results for input(s): LIPASE, AMYLASE in the last 168 hours.  Recent Labs Lab 02/04/16 1850  AMMONIA 84*   CBC:  Recent Labs Lab 02/06/16 0735 02/06/16 1831 02/07/16 0704 02/08/16 0126 02/09/16 0720  WBC 9.0 7.9 6.1 7.8 9.1  NEUTROABS 5.5  --   --   --   --   HGB 8.6* 8.2* 7.8* 9.0* 8.1*  HCT 24.8* 25.3* 23.4* 27.5* 24.5*  MCV 90.8 93.7 94.0 93.5 91.1  PLT 63* 52* 49* 41* 49*   Cardiac Enzymes:  Recent Labs Lab 02/04/16 1845 02/05/16 0111 02/05/16 1702  TROPONINI 0.03 <0.03 0.05*   BNP (last 3 results)  Recent Labs  06/01/15 1527  BNP 148.6*    ProBNP (last 3 results) No results for input(s): PROBNP in the last 8760 hours.  CBG:  Recent Labs Lab 02/08/16 1452 02/08/16 1556 02/08/16 1912 02/08/16 2343 02/09/16 0359  GLUCAP 160* 118* 131* 109* 111*    Recent Results (from the past 240 hour(s))  MRSA PCR Screening     Status: None   Collection Time: 02/04/16  5:41 PM    Result Value Ref Range Status   MRSA by PCR NEGATIVE NEGATIVE Final    Comment:        The GeneXpert MRSA Assay (FDA approved for NASAL specimens only), is one component of a comprehensive MRSA colonization surveillance program. It is not intended to diagnose MRSA infection nor to guide or monitor treatment for MRSA infections.      Studies:  Recent x-ray studies have been reviewed in detail by the Attending Physician  Scheduled Meds:  Scheduled Meds: . antiseptic oral rinse  7 mL Mouth Rinse BID  . aspirin EC  81 mg Oral Daily  . atorvastatin  10 mg Oral QPM  . buPROPion  150 mg Oral Daily  . clopidogrel  75 mg Oral Daily  . furosemide  20 mg Oral Daily  . isosorbide mononitrate  30 mg Oral Daily  . metoprolol succinate  12.5 mg Oral Daily  . pantoprazole  40 mg Oral BID  . spironolactone  12.5 mg Oral Daily    Time spent on care of this patient: 40 mins   Vincent Ochoa, Vincent Ochoa , Vincent Ochoa  [facility] Office  [phone] Pager - [phone]  On-Call/Text Page:      [email or web address]      password [email or web address]  If 7PM-7AM, please contact night-coverage [email or web address] Password [email or web address] 02/09/2016, 8:01 PM   LOS: 5 days   Care during the described time interval was provided by me .  I have reviewed this patient's available data, including medical history, events of Ochoa, physical examination, and all test results as part of my evaluation. I have personally reviewed and interpreted all radiology studies.   Vincent Crawford, Vincent Ochoa [phone] Pager

## 2016-02-09 NOTE — Progress Notes (Signed)
CASE MANAGEMENT NOTE   Patient Details  Name: Vincent Ochoa MRN: [id number] Date of Birth: [birth year]  Subjective/Objective: GI bleed, chronic thrombocytopenia from liver disease    Action/Plan: Discharge Planning:   NCM spoke to pt at bedside. Has CPAP at home, but not oxygen. States he had [facility] in the past and would like if HH needed at dc. Lives at home with wife, Vincent Ochoa # [id number]. States he has Medicare and Medicaid. NCM requested insurance card. Explained will send to admissions. States he will speak to wife when she comes and make sure she brings his insurance cards.   Possible dc needs: Assess oxygen for home Home Health   PCP-SCHMITZ, Enid Baas MD  02/09/2016 1723 NCM spoke to wife and states he does not have Medicare or Medicaid. They applied when he was at [facility] and is waiting for updated. States he goes to [employer]. States pt will need assistance with medications. Explained [place] program and that it can be used once per year. Will MATCH at dc for assistance with meds.   Expected Discharge Date:     Expected Discharge Plan: [place]  In-House Referral: NA  Discharge planning Services CM Consult  Post Acute Care Choice: Home Health Choice offered to: Patient   Status of Service: In process, will continue to follow  Medicare Important Message Given: Yes Date Medicare IM Given:   Medicare IM give by:   Date Additional Medicare IM Given:   Additional Medicare Important Message give by:    If discussed at [place] of Stay Meetings, dates discussed:   Additional Comments:  Erenest Rasher, RN 02/08/2016, 10:56 AM

## 2016-02-09 NOTE — Progress Notes (Signed)
PHARMACIST - PHYSICIAN COMMUNICATION  CONCERNING:  Rocephin   RECOMMENDATION: Consider stopping  DESCRIPTION: Day # 6 of Rocephin for SBP prophylaxis  Thank you. Anette Guarneri, PharmD [phone]

## 2016-02-10 ENCOUNTER — Inpatient Hospital Stay (HOSPITAL_COMMUNITY): Payer: Medicaid Other

## 2016-02-10 DIAGNOSIS — R188 Other ascites: Secondary | ICD-10-CM

## 2016-02-10 LAB — CBC
HCT: 23.4 % — ABNORMAL LOW (ref 39.0–52.0)
Hemoglobin: 8 g/dL — ABNORMAL LOW (ref 13.0–17.0)
MCH: 31.6 pg (ref 26.0–34.0)
MCHC: 34.2 g/dL (ref 30.0–36.0)
MCV: 92.5 fL (ref 78.0–100.0)
PLATELETS: 47 10*3/uL — AB (ref 150–400)
RBC: 2.53 MIL/uL — AB (ref 4.22–5.81)
RDW: 16.8 % — ABNORMAL HIGH (ref 11.5–15.5)
WBC: 5.9 10*3/uL (ref 4.0–10.5)

## 2016-02-10 LAB — BODY FLUID CELL COUNT WITH DIFFERENTIAL
Eos, Fluid: 0 %
LYMPHS FL: 6 %
Monocyte-Macrophage-Serous Fluid: 44 % — ABNORMAL LOW (ref 50–90)
NEUTROPHIL FLUID: 50 % — AB (ref 0–25)
WBC FLUID: 209 uL (ref 0–1000)

## 2016-02-10 LAB — LACTATE DEHYDROGENASE, PLEURAL OR PERITONEAL FLUID: LD, Fluid: 38 U/L — ABNORMAL HIGH (ref 3–23)

## 2016-02-10 LAB — GRAM STAIN

## 2016-02-10 LAB — PROTIME-INR
INR: 1.39 (ref 0.00–1.49)
PROTHROMBIN TIME: 17.2 s — AB (ref 11.6–15.2)

## 2016-02-10 LAB — PROTEIN, BODY FLUID: Total protein, fluid: <3 g/dL

## 2016-02-10 MED ORDER — LIDOCAINE HCL (PF) 1 % IJ SOLN
INTRAMUSCULAR | Status: AC
  Filled 2016-02-10: qty 10

## 2016-02-10 MED ORDER — FUROSEMIDE 10 MG/ML IJ SOLN
40.0000 mg | Freq: Two times a day (BID) | INTRAMUSCULAR | Status: DC
  Administered 2016-02-10 – 2016-02-13 (×6): 40 mg via INTRAVENOUS
  Filled 2016-02-10 (×6): qty 4

## 2016-02-10 MED ORDER — SPIRONOLACTONE 25 MG PO TABS
25.0000 mg | ORAL_TABLET | Freq: Every day | ORAL | Status: DC
Start: 2016-02-11 — End: 2016-02-13
  Administered 2016-02-11 – 2016-02-13 (×3): 25 mg via ORAL
  Filled 2016-02-10 (×3): qty 1

## 2016-02-10 NOTE — Procedures (Signed)
Ultrasound-guided diagnostic and therapeutic paracentesis performed yielding 6.2 liters of light yellow fluid. No immediate complications. A portion of the fluid was sent to the lab for preordered studies.

## 2016-02-10 NOTE — Progress Notes (Signed)
Pt wanted to ambulate around the unit. Pt insisted on ambulation due to chronic back pain. Pt tolerated well without any adverse events. Ambulated on room air and O2 sats were >90%.

## 2016-02-10 NOTE — Progress Notes (Signed)
[facility] TEAM 1 - Stepdown/ICU TEAM PROGRESS NOTE  Vincent Ochoa [id number] DOB: 04/20/1956 DOA: 02/04/2016 PCP: Clearance Coots, MD  Admit HPI / Brief Narrative: 60 y.o. male with Hx of cirrhosis who presented to ED at [facility] on 03/15 with the acute onset of hematemesis. On arrival to the ED he was hypotensive. Per his wife he vomited so much blood it filled 2 small trash cans. Blood was dark in color without any coffee grounds or clots. Pt was on plavix and ASA as outpatient which he takes for cardiac stent. He had not had any similar symptoms in the past and denied heavy NSAID use. He drinks minimally now but does apparently have hx of heavy ETOH use back in the 90's.  He was admitted initially to [facility].    Significant Events: GI consulted 03/15 for hematemesis. 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 Also required PRBC transfusion. Bleeding recurred AM 03/16. 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 Pt was subsequently transferred to [facility] ICU for procedure.  3/16 transferred to [facility] for BRTO - intubated 3/17 extubated   HPI/Subjective: The patient is resting comfortably on C Pap for a nap.  He easily awakens.  He complains of abdominal distention which is significantly more pronounced than his baseline.  He reports some shortness of breath associated with this.  He is also noted swelling in his legs which is not usually there.  He says he has never been informed he has cirrhosis.  We had a lengthy discussion about cirrhosis and the absolute need for close management and follow-up in the outpatient setting.  Assessment/Plan:  Hematemesis - bleeding gastric varices  S/p successful BRTO early AM 3/17 per IR - was tx empirically w/ PPI and octreotide gtts - f/u CT abdom suggests no acute complications - no evidence of ongoing  bleeding presently    EGD at [facility] 3/15: Findings: - Esophagus. One column of small varix seen. Esophagitis and stricture not identified. - Gastric lumen. Old blood seen. 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 Mild PHG. - Duodenum. Bulb to D2 normal. Impression:  1. Recent upper GI bleeding from gastric varices. High risk for rebleeding.  Cirrhosis - NASH + remote EtOH  Resumed maintenance medical tx - MELD is 62 - educated patient on need for very close follow-up - the patient is established with a primary care physician and states he has seen a GI doctor in [facility] - referral back to this GI physician, or a different physician should be arranged by his primary care physician as an outpatient - we will deal with the acute implications of his cirrhosis during his hospital stay Filed Weights   02/07/16 0338 02/10/16 0015 02/10/16 0319  Weight: 146.4 kg (322 lb 12.1 oz) 150.5 kg (331 lb 12.7 oz) 150.5 kg (331 lb 12.7 oz)   Large volume ascites Due to above - now symptomatic - will ask IR to perform paracentesis (this will be his first) and will continue diuretic/titration of outpatient medications  Acute blood loss anemia Hemoglobin stable - follow in serial fashion w/ resumption of ASA + Plavix - I suspect the one hemoglobin of 9 was spurious and in fact his hemoglobin is more accurately holding steady around 8  Chronic thrombocytopenia Followed by Dr. 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  Code Status: FULL Family Communication: spoke w/ wife at bedside  Disposition Plan: Transfer to medical bed - paracentesis - diurese - PT/OT  Consultants: PCCM Hematology  IR  Antibiotics: Ceftriaxone 3/16 > 3/20  DVT prophylaxis: SCDs  Objective: Blood pressure 122/68, pulse 70, temperature 98.7 F (37.1 C), temperature source Oral, resp. rate 19, height 6\' 4"  (1.93 m), weight 150.5 kg (331 lb 12.7 oz), SpO2 94 %.  Intake/Output Summary (Last 24 hours) at 02/10/16 0954 Last data filed at 02/10/16 0630  Gross per 24 hour  Intake    720 ml  Output   1500 ml  Net   -780 ml   Exam: General: No acute respiratory distress - alert and conversant Lungs: Clear to auscultation bilaterally - no wheezes or crackles Cardiovascular: Regular rate and rhythm without murmur gallop or rub  Abdomen: Nontender, markedly distended and becoming more firm, bowel sounds positive, no rebound, no appreciable mass Extremities: No significant cyanosis, or clubbing; 2+ edema bilateral lower extremities  Data Reviewed: Basic Metabolic Panel:  Recent Labs Lab 02/04/16 1845 02/05/16 0111 02/05/16 1702 02/06/16 0735 02/07/16 0704 02/08/16 0126 02/09/16 0720  NA 151* 150* 151* 146* 139 137 134*  K 4.4 6.0* 4.8 4.9 3.8 4.2 4.2  CL 118* 117* 118* 117* 108 106 104  CO2 27 23 23 22 24 24 22   GLUCOSE 136* 136* 139* 126* 123* 129* 97  BUN 37* 38* 44* 36* 21* 17 25*  CREATININE 1.16 1.25* 1.42* 1.24 1.06 1.05 1.16  CALCIUM 8.0* 7.7* 7.7* 7.6* 7.5* 7.7* 7.5*  MG 2.0 2.2  --   --  2.0  --   --   PHOS 3.1 4.9*  --   --  2.4*  --   --     CBC:  Recent Labs Lab 02/06/16 0735 02/06/16 1831 02/07/16 0704 02/08/16 0126 02/09/16 0720  WBC 9.0 7.9 6.1 7.8 9.1  NEUTROABS 5.5  --   --   --   --   HGB 8.6* 8.2* 7.8*  9.0* 8.1*  HCT 24.8* 25.3* 23.4* 27.5* 24.5*  MCV 90.8 93.7 94.0 93.5 91.1  PLT 63* 52* 49* 41* 49*    Liver Function Tests:  Recent Labs Lab 02/04/16 1845 02/06/16 0735 02/09/16 0720  AST 54* 90* 55*  ALT 51 58 52  ALKPHOS 54 61 66  BILITOT 1.5* 1.8* 2.0*  PROT 5.2* 5.3* 5.5*  ALBUMIN 2.6* 2.7* 2.4*    Recent Labs Lab 02/04/16 1850  AMMONIA 84*    Coags:  Recent Labs Lab 02/04/16 1840 02/05/16 0111 02/07/16 0704  INR 1.48 1.45 1.43    Recent Labs Lab 02/07/16 0704  APTT 30    Cardiac Enzymes:  Recent Labs Lab 02/04/16 1845 02/05/16 0111 02/05/16 1702  TROPONINI 0.03 <0.03 0.05*    CBG:  Recent Labs Lab 02/08/16 1452 02/08/16 1556 02/08/16  1912 02/08/16 2343 02/09/16 0359  GLUCAP 160* 118* 131* 109* 111*    Recent Results (from the past 240 hour(s))  MRSA PCR Screening     Status: None   Collection Time: 02/04/16  5:41 PM  Result Value Ref Range Status   MRSA by PCR NEGATIVE NEGATIVE Final    Comment:        The GeneXpert MRSA Assay (FDA approved for NASAL specimens only), is one component of a comprehensive MRSA colonization surveillance program. It is not intended to diagnose MRSA infection nor to guide or monitor treatment for MRSA infections.      Studies:   Recent x-ray studies have been reviewed in detail by the Attending Physician  Scheduled Meds:  Scheduled Meds: . antiseptic oral rinse  7 mL Mouth Rinse BID  . aspirin EC  81 mg Oral Daily  . atorvastatin  10 mg Oral QPM  . buPROPion  150 mg Oral Daily  . clopidogrel  75 mg Oral Daily  . furosemide  20 mg Oral Daily  . isosorbide mononitrate  30 mg Oral Daily  . metoprolol succinate  12.5 mg Oral Daily  . pantoprazole  40 mg Oral BID  . spironolactone  12.5 mg Oral Daily    Time spent on care of this patient: 35 mins   Dahlila Pfahler T , MD   [facility] Office  [phone] Pager - Text Page per Shea Evans as per below:  On-Call/Text Page:       [email or web address]      password [email or web address]  If 7PM-7AM, please contact night-coverage [email or web address] Password [email or web address] 02/10/2016, 9:54 AM   LOS: 6 days

## 2016-02-10 NOTE — Progress Notes (Signed)
Pt to [place] to 6N-25, post US paracentesis, Pt states he feels good, no complications were reported.

## 2016-02-11 DIAGNOSIS — I2510 Atherosclerotic heart disease of native coronary artery without angina pectoris: Secondary | ICD-10-CM

## 2016-02-11 DIAGNOSIS — I864 Gastric varices: Principal | ICD-10-CM

## 2016-02-11 LAB — CBC
HCT: 22.5 % — ABNORMAL LOW (ref 39.0–52.0)
HEMOGLOBIN: 7.3 g/dL — AB (ref 13.0–17.0)
MCH: 30 pg (ref 26.0–34.0)
MCHC: 32.4 g/dL (ref 30.0–36.0)
MCV: 92.6 fL (ref 78.0–100.0)
Platelets: 52 10*3/uL — ABNORMAL LOW (ref 150–400)
RBC: 2.43 MIL/uL — ABNORMAL LOW (ref 4.22–5.81)
RDW: 16.4 % — AB (ref 11.5–15.5)
WBC: 4.5 10*3/uL (ref 4.0–10.5)

## 2016-02-11 LAB — COMPREHENSIVE METABOLIC PANEL
ALBUMIN: 2.2 g/dL — AB (ref 3.5–5.0)
ALK PHOS: 70 U/L (ref 38–126)
ALT: 57 U/L (ref 17–63)
ANION GAP: 6 (ref 5–15)
AST: 68 U/L — ABNORMAL HIGH (ref 15–41)
BILIRUBIN TOTAL: 1.3 mg/dL — AB (ref 0.3–1.2)
BUN: 21 mg/dL — ABNORMAL HIGH (ref 6–20)
CALCIUM: 7.6 mg/dL — AB (ref 8.9–10.3)
CO2: 28 mmol/L (ref 22–32)
Chloride: 102 mmol/L (ref 101–111)
Creatinine, Ser: 1.09 mg/dL (ref 0.61–1.24)
GLUCOSE: 95 mg/dL (ref 65–99)
Potassium: 4.1 mmol/L (ref 3.5–5.1)
Sodium: 136 mmol/L (ref 135–145)
TOTAL PROTEIN: 5.2 g/dL — AB (ref 6.5–8.1)

## 2016-02-11 LAB — PH, BODY FLUID: PH, BODY FLUID: 7.6

## 2016-02-11 LAB — HEPATITIS PANEL, ACUTE
HCV Ab: <0.1 s/co ratio (ref 0.0–0.9)
HEP B C IGM: NEGATIVE
HEP B S AG: NEGATIVE
Hep A IgM: NEGATIVE

## 2016-02-11 LAB — LACTATE DEHYDROGENASE: LDH: 289 U/L — AB (ref 98–192)

## 2016-02-11 NOTE — Progress Notes (Signed)
Pt. Placed on CPAP for h/s, tolerating well. 

## 2016-02-11 NOTE — Progress Notes (Signed)
PROGRESS NOTE  MAYEN ALSMAN [id number] DOB: 1956-07-18 DOA: 02/04/2016 PCP: Clearance Coots, MD  Admit HPI / Brief Narrative: 60 y.o. male with Hx of cirrhosis who presented to ED at [facility] on 03/15 with the acute onset of hematemesis. On arrival to the ED he was hypotensive. Per his wife he vomited so much blood it filled 2 small trash cans. Blood was dark in color without any coffee grounds or clots. Pt was on plavix and ASA as outpatient which he takes for cardiac stent. He had not had any similar symptoms in the past and denied heavy NSAID use. He drinks minimally now but does apparently have hx of heavy ETOH use back in the 90's.  He was admitted initially to [facility].    Significant Events: GI consulted 03/15 for hematemesis. 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 Also required PRBC transfusion. Bleeding recurred AM 03/16. 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 Pt was subsequently transferred to [facility] ICU for procedure.  3/16 transferred to [facility] for BRTO - intubated 3/17 extubated    HPI/Subjective: The patient has no new complaints today. No acute issues overnight reported.  Assessment/Plan:  Hematemesis - bleeding gastric varices  S/p successful BRTO early AM 3/17 per IR - was tx empirically w/ PPI and octreotide gtts - f/u CT abdom suggests no acute complications - no evidence of ongoing bleeding presently    EGD at [facility] 3/15: Findings: - Esophagus. One column of small varix seen. Esophagitis and stricture not identified. - Gastric lumen. Old blood seen. 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 Mild PHG. - Duodenum. Bulb to D2 normal. Impression:  1. Recent upper GI bleeding from gastric varices. High risk for rebleeding.  Consulted Cardiology for recommendations regarding DAPT. Recommendations are to  hold DAPT for one week then start aspirin and hold plavix  Cirrhosis - NASH + remote EtOH  Resumed maintenance medical tx - MELD is 91 - educated patient on need for very close follow-up - the patient is established with a primary care physician and states he has seen a GI doctor in [facility] - referral back to this GI physician, or a different physician should be arranged by his primary care physician as an outpatient - we will deal with the acute implications of his cirrhosis during his hospital stay Filed Weights   02/10/16 0319 02/10/16 1810 02/11/16 0500  Weight: 150.5 kg (331 lb 12.7 oz) 146.2 kg (322 lb 5 oz) 144.7 kg (319 lb 0.1 oz)   Large volume ascites S/p  paracentesis by IR 02/10/16 (this was his first) continued diuretic/titration of outpatient medications  Acute blood loss anemia DAPT being held. Aspirin may be started in 1 week - reassess cbc next am.  Chronic thrombocytopenia Followed by Dr. 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  HLD Continue usual home med tx   PAF Normal sinus rhythm at present - full anticoag ill advised given liver disease  Hypernatremia resolved  SBP prophylaxis Ceftriaxone for 5 days  Acute metabolic encephalopathy Resolved  Hx back pain  Obesity - Body mass index is 38.85 kg/(m^2).  Code Status: FULL Family Communication: spoke w/ wife at bedside  Disposition Plan: Reassess hgb next am. If evidence of bleeding transfuse  Consultants: PCCM Hematology  IR  Antibiotics: Ceftriaxone 3/16 > 3/20  DVT prophylaxis: SCDs  Objective: Blood pressure  120/62, pulse 79, temperature 98.2 F (36.8 C), temperature source Oral, resp. rate 15, height 6\' 4"  (1.93 m), weight 144.7 kg (319 lb 0.1 oz), SpO2 97 %.  Intake/Output Summary (Last 24 hours) at 02/11/16 1536 Last data filed at 02/11/16 1433  Gross per 24 hour  Intake    480 ml  Output   1800 ml  Net  -1320 ml   Exam: General: No acute respiratory distress - alert and conversant Lungs: Clear to auscultation bilaterally - no wheezes or crackles, equal chest rise. Cardiovascular: Regular rate and rhythm without murmur gallop or rub  Abdomen: Nontender,  distended , bowel sounds positive, no rebound, no appreciable mass Extremities: No significant cyanosis, or clubbing; 2+ edema bilateral lower extremities  Data Reviewed: Basic Metabolic Panel:  Recent Labs Lab 02/04/16 1845 02/05/16 0111  02/06/16 0735 02/07/16 0704 02/08/16 0126 02/09/16 0720 02/11/16 0320  NA 151* 150*  < > 146* 139 137 134* 136  K 4.4 6.0*  < > 4.9 3.8 4.2 4.2 4.1  CL 118* 117*  < > 117* 108 106 104 102  CO2 27 23  < > 22 24 24 22 28   GLUCOSE 136* 136*  < > 126* 123* 129* 97 95  BUN 37* 38*  < > 36* 21* 17 25* 21*  CREATININE 1.16 1.25*  < > 1.24 1.06 1.05 1.16 1.09  CALCIUM 8.0* 7.7*  < > 7.6* 7.5* 7.7* 7.5* 7.6*  MG 2.0 2.2  --   --  2.0  --   --   --   PHOS 3.1 4.9*  --   --  2.4*  --   --   --   < > = values in this interval not displayed.  CBC:  Recent Labs Lab 02/06/16 0735  02/07/16 0704 02/08/16 0126 02/09/16 0720 02/10/16 1135 02/11/16 0320  WBC 9.0  < > 6.1 7.8 9.1 5.9 4.5  NEUTROABS 5.5  --   --   --   --   --   --   HGB 8.6*  < > 7.8* 9.0* 8.1* 8.0* 7.3*  HCT 24.8*  < > 23.4* 27.5* 24.5* 23.4* 22.5*  MCV 90.8  < > 94.0 93.5 91.1 92.5 92.6  PLT 63*  < > 49* 41* 49* 47* 52*  < > = values in this interval not displayed.  Liver Function Tests:  Recent Labs Lab 02/04/16 1845 02/06/16 0735 02/09/16 0720 02/11/16 0320  AST 54* 90* 55* 68*  ALT 51 58 52 57  ALKPHOS 54 61 66  70  BILITOT 1.5* 1.8* 2.0* 1.3*  PROT 5.2* 5.3* 5.5* 5.2*  ALBUMIN 2.6* 2.7* 2.4* 2.2*    Recent Labs Lab 02/04/16 1850  AMMONIA 84*    Coags:  Recent Labs Lab 02/04/16 1840 02/05/16 0111 02/07/16 0704 02/10/16 1418  INR 1.48 1.45 1.43 1.39    Recent Labs Lab 02/07/16 0704  APTT 30    Cardiac Enzymes:  Recent Labs Lab 02/04/16 1845 02/05/16 0111 02/05/16 1702  TROPONINI 0.03 <0.03 0.05*    CBG:  Recent Labs Lab 02/08/16 1452 02/08/16 1556  02/08/16 1912 02/08/16 2343 02/09/16 0359  GLUCAP 160* 118* 131* 109* 111*    Recent Results (from the past 240 hour(s))  MRSA PCR Screening     Status: None   Collection Time: 02/04/16  5:41 PM  Result Value Ref Range Status   MRSA by PCR NEGATIVE NEGATIVE Final    Comment:        The GeneXpert MRSA Assay (FDA approved for NASAL specimens only), is one component of a comprehensive MRSA colonization surveillance program. It is not intended to diagnose MRSA infection nor to guide or monitor treatment for MRSA infections.   Gram stain     Status: None   Collection Time: 02/10/16  5:56 PM  Result Value Ref Range Status   Specimen Description PERITONEAL ABDOMEN  Final   Special Requests NONE  Final   Gram Stain   Final    FEW WBC PRESENT,BOTH PMN AND MONONUCLEAR NO ORGANISMS SEEN    Report Status 02/10/2016 FINAL  Final  Culture, body fluid-bottle     Status: None (Preliminary result)   Collection Time: 02/10/16  5:56 PM  Result Value Ref Range Status   Specimen Description FLUID ABDOMEN PERITONEAL  Final   Special Requests BOTTLES DRAWN AEROBIC AND ANAEROBIC 10CC  Final   Culture NO GROWTH < 24 HOURS  Final   Report Status PENDING  Incomplete     Studies:   Recent x-ray studies have been reviewed in detail by the Attending Physician  Scheduled Meds:  Scheduled Meds: . aspirin EC  81 mg Oral Daily  . atorvastatin  10 mg Oral QPM  . buPROPion  150 mg Oral Daily  . clopidogrel  75 mg Oral  Daily  . furosemide  40 mg Intravenous Q12H  . isosorbide mononitrate  30 mg Oral Daily  . metoprolol succinate  12.5 mg Oral Daily  . pantoprazole  40 mg Oral BID  . spironolactone  25 mg Oral Daily    Time spent on care of this patient: 35 mins   Velvet Bathe , MD   [facility] Office  [phone] Pager - Text Page per Shea Evans as per below:  On-Call/Text Page:      [email or web address]      password [email or web address]  If 7PM-7AM, please contact night-coverage [email or web address] Password [facility] 02/11/2016, 3:36 PM   LOS: 7 days

## 2016-02-11 NOTE — Consult Note (Signed)
CARDIOLOGY CONSULT NOTE   Patient ID: Vincent Ochoa MRN: [id number] DOB/AGE: 06/21/1956 60 y.o.  Admit date: 02/04/2016  Requesting Physician: Dr Wendee Beavers Primary Physician   Clearance Coots, MD Primary Cardiologist  Dr. Burt Knack   Reason for Consultation   GI bleed on ASA and plavix.   HPI: Vincent Ochoa is a 60 y.o. 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 Cardiology was consulted for help in management of ASA/plavix.  He does have a history of coronary artery disease and is status post bare-metal stenting of the mid left circumflex in December 2015 in the setting of NSTEMI. He had a relook catheterization in March 2016 which showed continued patency. He was re admitted to [facility] in 05/2015 with recurrent chest discomfort and ruled out for MI. Repeat catheterization was performed and again revealed minimal nonobstructive in-stent restenosis within the left circumflex and otherwise nonobstructive CAD. LV function was normal. He has been maintained on aspirin, Plavix, beta blocker, statin, and long-acting nitrate therapy.   He was in his usual state of health until last week when he had hematemesis and his wife called EMS. He initiallty presented to ED at [facility] on 03/15 with the acute onset of hematemesis. On arrival to the ED he was hypotensive. Per his wife he vomited so much blood it filled 2 small trash cans. Blood was dark in color without any coffee grounds or clots.He had not had any similar symptoms in the past and denied heavy NSAID use. He drinks minimally now but does apparently have hx of heavy ETOH use back in the 90's. He was admitted initially to [facility].   GI consulted 03/15 for hematemesis. Had EGD  that revealed large gastric varices but no active bleeding but felt to be at high risk for re-bleeding. Also required PRBC transfusion. Bleeding recurred AM 03/16. 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 Pt was subsequently transferred to [facility] ICU for procedure.  3/16 transferred to [facility] for BRTO - intubated/then extubated. He is now s/p successful BRTO early AM 3/17 per IR - was tx empirically w/ PPI and octreotide gtts - f/u CT abdom suggests no acute complications and no evidence of ongoing bleeding presently. He also had significant acites and underwent succuessful paracentesis yesterday. Cardiology was consulted for help in managing his antiplatelets.   He denies any chest pain or SOB. He had recently been quite active walking around outside with no issues. He routinely walks 4-5 miles a day. He had noticed worsening abdominal distention and LE edema but no orthopnea, PND or palpitations.   Past Medical History  Diagnosis Date  . GERD (gastroesophageal reflux disease)   . Psoriasis   . Frequent headaches   . Facial cellulitis 2014    Periorbital  . Coronary artery disease     a. Evaluated by Dr. Stanford Breed 2012 - nonobstructive 2007-2008 by cath. b. Abnormal stress test 10/2014 but cath deferred temporarily due to thrombocytopenia. c. 10/2014: readm with AF-RVR/NSTEMI - s/p BMS to mLCx 11/04/14 (mod dz in RCA);  d. relook cath 01/2015 and 06/02/2015 patent stent, nonobs CAD, EF  55-65%.  . Back pain, lumbosacral 2014  . Thrombocytopenia ([place])     a. Onset unclear, but noted 2012. ? 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 s/p prednisone, IVIG.  Marland Kitchen Hyperlipidemia   . Obesity   . Elevated LFTs   . Fatty liver US - 2012  . PAF (paroxysmal atrial fibrillation) ([place])     a. Episode 10/2014 at time of NSTEMI, spont converted to NSR. Observation for  further episodes (not on anticoag at present time due to [facility] = 1, thrombocytopenia).  . Sinus bradycardia   . OSA (obstructive sleep apnea)     severe with AHI 56/hr with successful CPAP titration to 18cm H2O  . Morbid obesity [facility])      Past Surgical History  Procedure Laterality Date  . Tonsillectomy  1960's  . Cholecystectomy  2007  . Knee arthroplasty  1970's  . Facial cosmetic surgery  2014  . Cardiac catheterization    . Left heart catheterization with coronary angiogram N/A 11/04/2014    Procedure: LEFT HEART CATHETERIZATION WITH CORONARY ANGIOGRAM;  Surgeon: Jettie Booze, MD;  Location: [facility] CATH LAB;  Service: Cardiovascular;  Laterality: N/A;  . Left heart catheterization with coronary angiogram N/A 01/28/2015    Procedure: LEFT HEART CATHETERIZATION WITH CORONARY ANGIOGRAM;  Surgeon: Sherren Mocha, MD;  Location: [facility] CATH LAB;  Service: Cardiovascular;  Laterality: N/A;  . Cardiac catheterization N/A 06/02/2015    Procedure: Left Heart Cath and Coronary Angiography;  Surgeon: Jettie Booze, MD;  Location: [place] CV LAB;  Service: Cardiovascular;  Laterality: N/A;  . Radiology with anesthesia N/A 02/04/2016    Procedure: RADIOLOGY WITH ANESTHESIA;  Surgeon: Medication Radiologist, MD;  Location: [place];  Service: Radiology;  Laterality: N/A;    No Known Allergies  I have reviewed the patient's current medications . aspirin EC  81 mg Oral Daily  . atorvastatin  10 mg Oral QPM  . buPROPion  150 mg Oral Daily  . clopidogrel  75 mg Oral Daily  . furosemide  40 mg Intravenous Q12H  . isosorbide mononitrate  30 mg Oral Daily  . metoprolol succinate  12.5 mg Oral Daily  . pantoprazole  40 mg Oral BID  . spironolactone  25 mg Oral Daily     fentaNYL (SUBLIMAZE) injection, ondansetron, oxyCODONE  Prior to Admission medications   Medication Sig Start Date End Date Taking? Authorizing Provider  aspirin 81 MG tablet Take 1 tablet (81 mg total) by mouth daily.  10/31/14  Yes Dayna N Dunn, PA-C  buPROPion (WELLBUTRIN XL) 150 MG 24 hr tablet Take 150 mg by mouth daily.   Yes Historical Provider, MD  clopidogrel (PLAVIX) 75 MG tablet Take 1 tablet (75 mg total) by mouth daily. 06/10/15  Yes Rogelia Mire, NP  furosemide (LASIX) 20 MG tablet Take 1 tablet (20 mg total) by mouth daily. 02/03/16  Yes Rosemarie Ax, MD  HYDROcodone-acetaminophen (NORCO/VICODIN) 5-325 MG tablet Take 1 tablet by mouth every 6 (six) hours as needed for moderate pain or severe pain. 02/03/16  Yes Rosemarie Ax, MD  isosorbide mononitrate (IMDUR) 30 MG 24 hr tablet Take 1 tablet (30 mg total) by mouth daily. 02/03/16  Yes Rosemarie Ax, MD  metoprolol succinate (TOPROL-XL) 25 MG 24 hr tablet Take 0.5 tablets (12.5 mg total) by mouth daily. 02/26/15  Yes Sherren Mocha, MD  nitroGLYCERIN (NITROSTAT) 0.4 MG SL tablet Place 1 tablet (0.4 mg total) under the tongue every 5 (  five) minutes as needed for chest pain (up to 3 doses). 10/31/14  Yes Dayna N Dunn, PA-C  atorvastatin (LIPITOR) 10 MG tablet Take 1 tablet (10 mg total) by mouth every evening. Patient not taking: Reported on 01/01/2016 12/16/14   Sherren Mocha, MD  cyclobenzaprine (FLEXERIL) 10 MG tablet Take 1 tablet (10 mg total) by mouth 3 (three) times daily as needed for muscle spasms. Patient not taking: Reported on 01/01/2016 09/14/15   Rosemarie Ax, MD     Social History   Social History  . Marital Status: Married    Spouse Name: N/A  . Number of Children: N/A  . Years of Education: N/A   Occupational History  . Not on file.   Social History Main Topics  . Smoking status: Current Some Day Smoker -- 0.20 packs/day for 43 years    Types: Cigarettes  . Smokeless tobacco: Never Used     Comment: 11/28/2012 "chew occasionally"  Quit for 2.5 months in early 2016  . Alcohol Use: 0.0 oz/week    0 Standard drinks or equivalent per week     Comment: 11/28/2012 "beer q 6 months or so"  . Drug Use: No  . Sexual  Activity: No   Other Topics Concern  . Not on file   Social History Narrative   Lives in [place].    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     Family Status  Relation Status Death Age  . Mother Deceased 64    Cirrhosis  . Father Deceased 59    MVA   Family History  Problem Relation Age of Onset  . Arthritis Maternal Grandmother   . Heart disease Sister   . Diabetes Sister   . Cancer - Ovarian Sister   . Heart disease      Maternal/paternal grandparents  . Cirrhosis Mother   . Heart attack Mother   . Heart attack Maternal Aunt   . Hypertension Mother   . Hypertension Father   . Stroke Father      ROS:  Full 14 point review of systems complete and found to be negative unless listed above.  Physical Exam: Blood pressure 126/65, pulse 81, temperature 99 F (37.2 C), temperature source Oral, resp. rate 16, height 6\' 4"  (1.93 m), weight 319 lb 0.1 oz (144.7 kg), SpO2 98 %.  General: Well developed, well nourished, male in no acute distress obese Head: Eyes PERRLA, No xanthomas.   Normocephalic and atraumatic, oropharynx without edema or exudate.  Lungs: CTAB Heart: HRRR S1 S2, no rub/gallop, Heart regular rate and rhythm with S1, S2  murmur. pulses are 2+ extrem.   Neck: No carotid bruits. No lymphadenopathy.  No JVD. Abdomen: Bowel sounds present, abdomen soft and non-tender without masses or hernias noted. Msk:  No spine or cva tenderness. No weakness, no joint deformities or effusions. Extremities: No clubbing or cyanosis. 2+ LE edema.  Neuro: Alert and oriented X 3. No focal deficits noted. Psych:  Good affect, responds appropriately Skin: No rashes or lesions noted.  Labs:   Lab Results  Component Value Date   WBC 4.5 02/11/2016   HGB 7.3* 02/11/2016   HCT 22.5* 02/11/2016   MCV 92.6 02/11/2016   PLT 52* 02/11/2016    Recent Labs  02/10/16 1418  INR 1.39    Recent  Labs Lab 02/11/16 0320  NA 136  K 4.1  CL 102  CO2 28  BUN 21*  CREATININE 1.09  CALCIUM 7.6*  PROT 5.2*  BILITOT 1.3*  ALKPHOS 70  ALT 57  AST 68*  GLUCOSE 95  ALBUMIN 2.2*   MAGNESIUM  Date Value Ref Range Status  02/07/2016 2.0 1.7 - 2.4 mg/dL Final   No results for input(s): CKTOTAL, CKMB, TROPONINI in the last 72 hours. No results for input(s): TROPIPOC in the last 72 hours. PRO B NATRIURETIC PEPTIDE (BNP)  Date/Time Value Ref Range Status  01/09/2015 01:29 PM 43.0 0.0 - 100.0 pg/mL Final  11/03/2014 10:21 AM 564.0* 0 - 125 pg/mL Final   Lab Results  Component Value Date   CHOL 157 06/02/2015   HDL 27* 06/02/2015   LDLCALC 115* 06/02/2015   TRIG 83 02/08/2016   Lab Results  Component Value Date   DDIMER 0.86* 11/18/2015   Echo12/07/2014 LV EF: 60% -  65% Study Conclusions - Left ventricle: The cavity size was mildly dilated. Systolic function was normal. The estimated ejection fraction was in the range of 60% to 65%. Wall motion was normal; there were no regional wall motion abnormalities. Features are consistent with a pseudonormal left ventricular filling pattern, with concomitant abnormal relaxation and increased filling pressure (grade 2 diastolic dysfunction). Doppler parameters are consistent with elevated ventricular end-diastolic filling pressure. - Aortic valve: Trileaflet; normal thickness leaflets. There was no regurgitation. - Aortic root: The aortic root was normal in size. - Mitral valve: Structurally normal valve. - Left atrium: The atrium was moderately dilated. - Right ventricle: Systolic function was normal. - Right atrium: The atrium was mildly dilated. - Tricuspid valve: There was mild regurgitation. - Pulmonic valve: There was no regurgitation. - Pulmonary arteries: Systolic pressure was within the normal range. - Inferior vena cava: The vessel was normal in size. - Pericardium, extracardiac: There was no  pericardial effusion.   Radiology:  US Paracentesis  02/10/2016  INDICATION: Cirrhosis, recent hematemesis ,status post BRTO (balloon occluded transvenous obliteration of gastric varices ) on 02/05/16, ascites. Request made for diagnostic and therapeutic paracentesis. EXAM: ULTRASOUND GUIDED DIAGNOSTIC AND THERAPEUTIC PARACENTESIS MEDICATIONS: None. COMPLICATIONS: None immediate. PROCEDURE: Informed written consent was obtained from the patient after a discussion of the risks, benefits and alternatives to treatment. A timeout was performed prior to the initiation of the procedure. Initial ultrasound scanning demonstrates a large amount of ascites within the right lower abdominal quadrant. The right lower abdomen was prepped and draped in the usual sterile fashion. 1% lidocaine was used for local anesthesia. Following this, a Yueh catheter was introduced. An ultrasound image was saved for documentation purposes. The paracentesis was performed. The catheter was removed and a dressing was applied. The patient tolerated the procedure well without immediate post procedural complication. FINDINGS: A total of approximately 6.2 liters of light yellow fluid was removed. Samples were sent to the laboratory as requested by the clinical team. IMPRESSION: Successful ultrasound-guided diagnostic and therapeutic paracentesis yielding 6.2 liters of peritoneal fluid. Read by: Rowe Robert, PA-C Electronically Signed   By: Lucrezia Europe M.D.   On: 02/10/2016 16:48    ASSESSMENT AND PLAN:    Active Problems:   GI bleed   Blood loss anemia   Acute respiratory failure with hypoxia (HCC)   Encephalopathy acute   Gastrointestinal hemorrhage   Acute respiratory failure (HCC)   Bleeding gastric varices   OSA on CPAP   Esophageal varices with bleeding (HCC)  Vincent Ochoa is a 60 y.o. male with a history of  OSA. 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 Cardiology was consulted for help in management of ASA/plavix.  Coronary artery disease: Status post prior bare-metal stenting of the left circumflex in December 2015 with relook catheterization in March 2016 and again July 2016. He has stable anatomy with minimal in-stent restenosis within the left circumflex. ASA and plavix currently held with hematemesis and bleeding esophageal varices. Would like him to at least be on ASA, but does not necessarily need plavix as he had BMS placement over 2 years ago and stable CAD on subsequent caths. If he is still actively bleeding we can hold both ASA and plavix with plans to restart baby ASA (w/o plavix) in 1 week if he stay stable.   Cirrhosis and variceal bleeding: this is a new diagnosis for the patient. Now s/p successful BRTO. Still with some bloody spit up. Per IM and GI.   Hyperlipidemia: continue statin  Morbid obesity: needs weight loss  Paroxysmal atrial fibrillation: No recent palpitations or evidence of PAF during this or previous hospitalizations. CHA2DS2VASc equals 1. Further, in setting of chronic thrombocytopenia, he has not previously been felt to be a strong candidate for oral anticoagulation.   SignedCrista Luria 02/11/2016 12:31 PM  Pager [phone]  Co-Sign MD  I have examined the patient and reviewed assessment and plan and discussed with patient.  Agree with above as stated.  Hold DAPT for 1 week.  Restart low dose aspirin 81 mg daily in a week.  Do not restart plavix.  Will cc: Dr. Burt Knack.  Vincent Gaber S.

## 2016-02-12 ENCOUNTER — Encounter (HOSPITAL_COMMUNITY): Payer: Self-pay | Admitting: *Deleted

## 2016-02-12 DIAGNOSIS — K7031 Alcoholic cirrhosis of liver with ascites: Secondary | ICD-10-CM

## 2016-02-12 LAB — CBC
HEMATOCRIT: 25.9 % — AB (ref 39.0–52.0)
HEMOGLOBIN: 8.3 g/dL — AB (ref 13.0–17.0)
MCH: 29.9 pg (ref 26.0–34.0)
MCHC: 32 g/dL (ref 30.0–36.0)
MCV: 93.2 fL (ref 78.0–100.0)
Platelets: 70 10*3/uL — ABNORMAL LOW (ref 150–400)
RBC: 2.78 MIL/uL — AB (ref 4.22–5.81)
RDW: 16.7 % — ABNORMAL HIGH (ref 11.5–15.5)
WBC: 5.8 10*3/uL (ref 4.0–10.5)

## 2016-02-12 LAB — PREPARE RBC (CROSSMATCH)

## 2016-02-12 MED ORDER — SODIUM CHLORIDE 0.9 % IV SOLN
Freq: Once | INTRAVENOUS | Status: AC
  Administered 2016-02-12: 16:00:00 via INTRAVENOUS

## 2016-02-12 NOTE — Progress Notes (Signed)
Pt has refused CPAP for the night.  Pt to notify RT if he changes his mind, RT to monitor and assess as needed.

## 2016-02-12 NOTE — Progress Notes (Signed)
PROGRESS NOTE  DELSHAWN COKLEY [id number] DOB: 1956-09-30 DOA: 02/04/2016 PCP: Clearance Coots, MD  Admit HPI / Brief Narrative: 60 y.o. male with Hx of cirrhosis who presented to ED at [facility] on 03/15 with the acute onset of hematemesis. On arrival to the ED he was hypotensive. Per his wife he vomited so much blood it filled 2 small trash cans. Blood was dark in color without any coffee grounds or clots. Pt was on plavix and ASA as outpatient which he takes for cardiac stent. He had not had any similar symptoms in the past and denied heavy NSAID use. He drinks minimally now but does apparently have hx of heavy ETOH use back in the 90's.  He was admitted initially to [facility].    Significant Events: GI consulted 03/15 for hematemesis. 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 Also required PRBC transfusion. Bleeding recurred AM 03/16. 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 Pt was subsequently transferred to [facility] ICU for procedure.  3/16 transferred to [facility] for BRTO - intubated 3/17 extubated    HPI/Subjective: The patient has no new complaints today. No acute issues overnight reported.  Assessment/Plan:  Hematemesis - bleeding gastric varices  S/p successful BRTO early AM 3/17 per IR - was tx empirically w/ PPI and octreotide gtts - f/u CT abdom suggests no acute complications - no evidence of ongoing bleeding presently    EGD at [facility] 3/15: Findings: - Esophagus. One column of small varix seen. Esophagitis and stricture not identified. - Gastric lumen. Old blood seen. 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 Mild PHG. - Duodenum. Bulb to D2 normal. Impression:  1. Recent upper GI bleeding from gastric varices. High risk for rebleeding.  Hg still trending down  8.1>7.3, will transfuse one more unit today  Consulted  Cardiology for recommendations regarding DAPT. Recommendations are to hold DAPT for one week then start aspirin and hold plavix    Cirrhosis - NASH + remote EtOH  Resumed maintenance medical tx - MELD is 31 - educated patient on need for very close follow-up - the patient is established with a primary care physician and states he has seen a GI doctor in [facility] - referral back to this GI physician, or a different physician should be arranged by his primary care physician as an outpatient - we will deal with the acute implications of his cirrhosis during his hospital stay Filed Weights   02/10/16 1810 02/11/16 0500 02/12/16 0300  Weight: 146.2 kg (322 lb 5 oz) 144.7 kg (319 lb 0.1 oz) 144.87 kg (319 lb 6.1 oz)     Large volume ascites S/p  paracentesis by IR 02/10/16 (this was his first) continued diuretic/titration of outpatient medications  Acute blood loss anemia DAPT being held. Aspirin may be started in 1 week - reassess cbc next am.  Chronic thrombocytopenia Followed by Dr. 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  HLD Continue usual home med tx  PAF Normal sinus rhythm at present - full anticoag ill advised given liver disease  Hypernatremia resolved  SBP prophylaxis Ceftriaxone for 5 days  Acute metabolic encephalopathy Resolved  Hx back pain  Obesity - Body mass index is 38.89 kg/(m^2).  Code Status: FULL Family Communication: spoke w/ wife at bedside  Disposition Plan: Transfuse today, anticipate discharge tomorrow  Consultants: PCCM Hematology  IR  Antibiotics: Ceftriaxone 3/16  > 3/20  DVT prophylaxis: SCDs  Objective: Blood pressure 130/60, pulse 79, temperature 98.8 F (37.1 C), temperature source Oral, resp. rate 19, height 6\' 4"  (1.93 m), weight 144.87 kg (319 lb 6.1 oz), SpO2 96 %.  Intake/Output Summary (Last 24 hours) at 02/12/16 0931 Last data filed at 02/12/16 0913  Gross per 24 hour  Intake   1080 ml  Output   1250 ml  Net   -170 ml   Exam: General: No acute respiratory distress - alert and conversant Lungs: Clear to auscultation bilaterally - no wheezes or crackles, equal chest rise. Cardiovascular: Regular rate and rhythm without murmur gallop or rub  Abdomen: Nontender,  distended , bowel sounds positive, no rebound, no appreciable mass Extremities: No significant cyanosis, or clubbing; 2+ edema bilateral lower extremities  Data Reviewed: Basic Metabolic Panel:  Recent Labs Lab 02/06/16 0735 02/07/16 0704 02/08/16 0126 02/09/16 0720 02/11/16 0320  NA 146* 139 137 134* 136  K 4.9 3.8 4.2 4.2 4.1  CL 117* 108 106 104 102  CO2 22 24 24 22 28   GLUCOSE 126* 123* 129* 97 95  BUN 36* 21* 17 25* 21*  CREATININE 1.24 1.06 1.05 1.16 1.09  CALCIUM 7.6* 7.5* 7.7* 7.5* 7.6*  MG  --  2.0  --   --   --   PHOS  --  2.4*  --   --   --     CBC:  Recent Labs Lab 02/06/16 0735  02/07/16 0704 02/08/16 0126 02/09/16 0720 02/10/16 1135 02/11/16 0320  WBC 9.0  < > 6.1 7.8 9.1 5.9 4.5  NEUTROABS 5.5  --   --   --   --   --   --   HGB 8.6*  < > 7.8* 9.0* 8.1* 8.0* 7.3*  HCT 24.8*  < > 23.4* 27.5* 24.5* 23.4* 22.5*  MCV 90.8  < > 94.0 93.5 91.1 92.5 92.6  PLT 63*  < > 49* 41* 49* 47* 52*  < > = values in this interval not displayed.  Liver Function Tests:  Recent Labs Lab 02/06/16 0735 02/09/16 0720 02/11/16 0320  AST 90* 55* 68*  ALT 58 52 57  ALKPHOS 61 66 70  BILITOT 1.8* 2.0* 1.3*  PROT 5.3* 5.5* 5.2*  ALBUMIN 2.7* 2.4* 2.2*   No results for input(s): AMMONIA in the last 168 hours.  Coags:  Recent Labs Lab  02/07/16 0704 02/10/16 1418  INR 1.43 1.39    Recent Labs Lab 02/07/16 0704  APTT 30    Cardiac Enzymes:  Recent Labs Lab 02/05/16 1702  TROPONINI 0.05*    CBG:  Recent Labs Lab 02/08/16 1452 02/08/16 1556 02/08/16 1912 02/08/16 2343 02/09/16 0359  GLUCAP 160* 118* 131* 109* 111*    Recent Results (from the past 240 hour(s))  MRSA PCR Screening     Status: None   Collection Time: 02/04/16  5:41 PM  Result Value Ref Range Status   MRSA by PCR NEGATIVE NEGATIVE Final    Comment:        The GeneXpert MRSA Assay (  FDA approved for NASAL specimens only), is one component of a comprehensive MRSA colonization surveillance program. It is not intended to diagnose MRSA infection nor to guide or monitor treatment for MRSA infections.   Gram stain     Status: None   Collection Time: 02/10/16  5:56 PM  Result Value Ref Range Status   Specimen Description PERITONEAL ABDOMEN  Final   Special Requests NONE  Final   Gram Stain   Final    FEW WBC PRESENT,BOTH PMN AND MONONUCLEAR NO ORGANISMS SEEN    Report Status 02/10/2016 FINAL  Final  Culture, body fluid-bottle     Status: None (Preliminary result)   Collection Time: 02/10/16  5:56 PM  Result Value Ref Range Status   Specimen Description FLUID ABDOMEN PERITONEAL  Final   Special Requests BOTTLES DRAWN AEROBIC AND ANAEROBIC 10CC  Final   Culture NO GROWTH < 24 HOURS  Final   Report Status PENDING  Incomplete     Studies:   Recent x-ray studies have been reviewed in detail by the Attending Physician  Scheduled Meds:  Scheduled Meds: . atorvastatin  10 mg Oral QPM  . buPROPion  150 mg Oral Daily  . furosemide  40 mg Intravenous Q12H  . isosorbide mononitrate  30 mg Oral Daily  . metoprolol succinate  12.5 mg Oral Daily  . pantoprazole  40 mg Oral BID  . spironolactone  25 mg Oral Daily    Time spent on care of this patient: 8 mins   Reyne Dumas , MD   [facility] Office   [phone] Pager - Text Page per Shea Evans as per below:   If 7PM-7AM, please contact night-coverage [email or web address] Password [facility] 02/12/2016, 9:31 AM   LOS: 8 days

## 2016-02-12 NOTE — Progress Notes (Signed)
   Decrease in Hbg noted.  Holding DAPT at this time.  Assess bleeding risk at a week and consider risk benefit of restarting antiplatelet therapy at that time.  Will cc: Dr. Burt Knack.    Please call back with questions.  Jettie Booze, MD

## 2016-02-13 ENCOUNTER — Inpatient Hospital Stay (HOSPITAL_COMMUNITY): Payer: Medicaid Other

## 2016-02-13 DIAGNOSIS — R140 Abdominal distension (gaseous): Secondary | ICD-10-CM | POA: Insufficient documentation

## 2016-02-13 LAB — COMPREHENSIVE METABOLIC PANEL
ALBUMIN: 2.5 g/dL — AB (ref 3.5–5.0)
ALK PHOS: 88 U/L (ref 38–126)
ALT: 77 U/L — AB (ref 17–63)
AST: 99 U/L — AB (ref 15–41)
Anion gap: 8 (ref 5–15)
BUN: 17 mg/dL (ref 6–20)
CALCIUM: 7.9 mg/dL — AB (ref 8.9–10.3)
CHLORIDE: 100 mmol/L — AB (ref 101–111)
CO2: 28 mmol/L (ref 22–32)
CREATININE: 1.12 mg/dL (ref 0.61–1.24)
GFR calc non Af Amer: >60 mL/min (ref >60)
GLUCOSE: 88 mg/dL (ref 65–99)
Potassium: 4.2 mmol/L (ref 3.5–5.1)
SODIUM: 136 mmol/L (ref 135–145)
Total Bilirubin: 2.1 mg/dL — ABNORMAL HIGH (ref 0.3–1.2)
Total Protein: 5.6 g/dL — ABNORMAL LOW (ref 6.5–8.1)

## 2016-02-13 LAB — TYPE AND SCREEN
ABO/RH(D): A NEG
ANTIBODY SCREEN: NEGATIVE
UNIT DIVISION: 0

## 2016-02-13 LAB — CBC
HCT: 26.8 % — ABNORMAL LOW (ref 39.0–52.0)
HEMOGLOBIN: 8.6 g/dL — AB (ref 13.0–17.0)
MCH: 29.6 pg (ref 26.0–34.0)
MCHC: 32.1 g/dL (ref 30.0–36.0)
MCV: 92.1 fL (ref 78.0–100.0)
PLATELETS: 69 10*3/uL — AB (ref 150–400)
RBC: 2.91 MIL/uL — AB (ref 4.22–5.81)
RDW: 17.6 % — ABNORMAL HIGH (ref 11.5–15.5)
WBC: 6 10*3/uL (ref 4.0–10.5)

## 2016-02-13 MED ORDER — FUROSEMIDE 20 MG PO TABS: 40.0000 mg | ORAL_TABLET | Freq: Two times a day (BID) | ORAL | Status: DC

## 2016-02-13 MED ORDER — DOCUSATE SODIUM 100 MG PO CAPS
100.0000 mg | ORAL_CAPSULE | Freq: Two times a day (BID) | ORAL | Status: DC
  Administered 2016-02-13: 100 mg via ORAL
  Filled 2016-02-13: qty 1

## 2016-02-13 MED ORDER — CLOPIDOGREL BISULFATE 75 MG PO TABS: 75.0000 mg | ORAL_TABLET | Freq: Every day | ORAL | Status: DC

## 2016-02-13 MED ORDER — PANTOPRAZOLE SODIUM 40 MG PO TBEC: 40.0000 mg | DELAYED_RELEASE_TABLET | Freq: Two times a day (BID) | ORAL | Status: DC

## 2016-02-13 MED ORDER — SPIRONOLACTONE 25 MG PO TABS: 25.0000 mg | ORAL_TABLET | Freq: Every day | ORAL | Status: DC

## 2016-02-13 MED ORDER — POLYETHYLENE GLYCOL 3350 17 G PO PACK
17.0000 g | PACK | Freq: Every day | ORAL | Status: DC
  Administered 2016-02-13: 17 g via ORAL
  Filled 2016-02-13: qty 1

## 2016-02-13 MED ORDER — DOCUSATE SODIUM 100 MG PO CAPS: 100.0000 mg | ORAL_CAPSULE | Freq: Two times a day (BID) | ORAL | Status: DC

## 2016-02-13 MED ORDER — ASPIRIN 81 MG PO TABS: 81.0000 mg | ORAL_TABLET | Freq: Every day | ORAL | Status: DC

## 2016-02-13 NOTE — Discharge Summary (Signed)
Physician Discharge Summary  Vincent Ochoa MRN: [id number] DOB/AGE: 26-Apr-1956 60 y.o.  PCP: Clearance Coots, MD   Admit date: 02/04/2016 Discharge date: 02/13/2016  Discharge Diagnoses:  Active Problems:   GI bleed   Blood loss anemia   Acute respiratory failure with hypoxia (HCC)   Encephalopathy acute   Gastrointestinal hemorrhage   Acute respiratory failure (HCC)   Bleeding gastric varices   OSA on CPAP   Esophageal varices with bleeding (HCC)   Alcoholic cirrhosis of liver with ascites (HCC)   Abdominal distention    Follow-up recommendations Follow-up with PCP in 3-5 days , including all  additional recommended appointments as below Follow-up CBC, CMP in 3-5 days Hold dual antiplatelet therapy for 1 week, follow-up with cardiology Dr. Burt Knack     Medication List    TAKE these medications        aspirin 81 MG tablet  Take 1 tablet (81 mg total) by mouth daily.  Start taking on:  02/22/2016     atorvastatin 10 MG tablet  Commonly known as:  LIPITOR  Take 1 tablet (10 mg total) by mouth every evening.     buPROPion 150 MG 24 hr tablet  Commonly known as:  WELLBUTRIN XL  Take 150 mg by mouth daily.     clopidogrel 75 MG tablet  Commonly known as:  PLAVIX  Take 1 tablet (75 mg total) by mouth daily.  Start taking on:  02/22/2016     cyclobenzaprine 10 MG tablet  Commonly known as:  FLEXERIL  Take 1 tablet (10 mg total) by mouth 3 (three) times daily as needed for muscle spasms.     docusate sodium 100 MG capsule  Commonly known as:  COLACE  Take 1 capsule (100 mg total) by mouth 2 (two) times daily.     furosemide 20 MG tablet  Commonly known as:  LASIX  Take 2 tablets (40 mg total) by mouth 2 (two) times daily.     HYDROcodone-acetaminophen 5-325 MG tablet  Commonly known as:  NORCO/VICODIN  Take 1 tablet by mouth every 6 (six) hours as needed for moderate pain or severe pain.     isosorbide mononitrate 30 MG 24 hr tablet  Commonly known as:  IMDUR   Take 1 tablet (30 mg total) by mouth daily.     metoprolol succinate 25 MG 24 hr tablet  Commonly known as:  TOPROL-XL  Take 0.5 tablets (12.5 mg total) by mouth daily.     nitroGLYCERIN 0.4 MG SL tablet  Commonly known as:  NITROSTAT  Place 1 tablet (0.4 mg total) under the tongue every 5 (five) minutes as needed for chest pain (up to 3 doses).     pantoprazole 40 MG tablet  Commonly known as:  PROTONIX  Take 1 tablet (40 mg total) by mouth 2 (two) times daily.     spironolactone 25 MG tablet  Commonly known as:  ALDACTONE  Take 1 tablet (25 mg total) by mouth daily.         Discharge Condition: *Stable   Discharge Instructions Get Medicines reviewed and adjusted: Please take all your medications with you for your next visit with your Primary MD  Please request your Primary MD to go over all hospital tests and procedure/radiological results at the follow up, please ask your Primary MD to get all Hospital records sent to his/her office.  If you experience worsening of your admission symptoms, develop shortness of breath, life threatening emergency, suicidal or homicidal thoughts  you must seek medical attention immediately by calling 911 or calling your MD immediately if symptoms less severe.  You must read complete instructions/literature along with all the possible adverse reactions/side effects for all the Medicines you take and that have been prescribed to you. Take any new Medicines after you have completely understood and accpet all the possible adverse reactions/side effects.   Do not drive when taking Pain medications.   Do not take more than prescribed Pain, Sleep and Anxiety Medications  Special Instructions: If you have smoked or chewed Tobacco in the last 2 yrs please stop smoking, stop any regular Alcohol and or any Recreational drug use.  Wear Seat belts while driving.  Please note  You were cared for by a hospitalist during your hospital stay. Once you  are discharged, your primary care physician will handle any further medical issues. Please note that NO REFILLS for any discharge medications will be authorized once you are discharged, as it is imperative that you return to your primary care physician (or establish a relationship with a primary care physician if you do not have one) for your aftercare needs so that they can reassess your need for medications and monitor your lab values.    No Known Allergies    Disposition: 01-Home or Self Care   Consults:  Cardiology Critical care Gastroenterology     Significant Diagnostic Studies:  Ct Abdomen Pelvis W Wo Contrast  02/08/2016  CLINICAL DATA:  History of alcoholic cirrhosis with upper GI bleed due to markedly enlarged gastric varices, post BRTO EXAM: CT ABDOMEN AND PELVIS WITHOUT AND WITH CONTRAST TECHNIQUE: Multidetector CT imaging of the abdomen and pelvis was performed following the standard protocol before and following the bolus administration of intravenous contrast. CONTRAST:  175m OMNIPAQUE IOHEXOL 300 MG/ML  SOLN COMPARISON:  BRTO protocol CT - 02/03/2016 FINDINGS: Sclerosant from BRTO procedure is seen throughout the patient's markedly enlarged gastric varices extending to near the entrance site of the varices at the level of the splenic vein (representative coronal image 100, series 6). There is no extension of the sclerosing to involve either the splenic or portal veins. Sclerosant is seen throughout the varices to the level of the exit site of the splenorenal shunt with minimal extension of the sclerosant to involve the cranial aspect of the renal vein (representative images 44, series 2, coronal image 119, series 6). The left renal vein appears widely patent as there is symmetric enhancement of the left kidney in relation to the right without any asymmetric perinephric stranding or evidence of left renal infarction or venous congestion. There is no evidence of sclerosant  extravasation. Re- demonstrated nodular contour of the liver compatible with cirrhosis. No discrete hepatic lesions on this non CTA examination. Post cholecystectomy. Interval increase in small to moderate amount of intra-abdominal ascites. Punctate (approximately 0.5 cm) nonobstructing stone within the inferior pole of the right kidney. No additional renal stones identified. Grossly symmetric bilateral perinephric stranding. No urinary obstruction. Normal appearance of the bilateral adrenal glands, though note, the left adrenal gland is suboptimally evaluated secondary to streak artifact from sclerosis and within the patient's enlarge gastric varices. Normal appearance of the pancreas and spleen. There is a small amount of perisplenic fluid. Moderate colonic stool burden without evidence of enteric obstruction. No pneumoperitoneum, pneumatosis or portal venous gas. Normal appearance of the terminal ileum and appendix. Scattered atherosclerotic plaque within a normal caliber abdominal aorta. The major branch vessels of the abdominal aorta appear patent on this non  CTA examination. Dystrophic calcifications within a normal sized prostate gland. Normal appearance of the urinary bladder given underdistention. Limited visualization of the lower thorax demonstrates trace bilateral effusions with associated dependent subsegmental atelectasis and associated air bronchograms within the imaged bilateral lower lobes. No new focal airspace opacities. Normal heart size. Coronary artery calcifications. No pericardial effusion. There is mild diffuse decreased attenuation of the intra cardiac blood pool suggestive of anemia. No acute or aggressive osseous abnormalities. Mild (25%) focal concavity involving the superior endplate of the L2 vertebral body, unchanged. Moderate diffuse body wall anasarca, most conspicuous about the midline of the lower back. IMPRESSION: 1. Technically successful BRT0 procedure with sclerosant seen  throughout the gastric varices. There is no extension of the sclerosis since into either the splenic or portal veins. There is minimal extension of the sclerosant to involve the cranial aspect of the left renal vein, however this does not result in altered hemodynamics of the left kidney. 2. Stable stigmata of cirrhosis without discrete hepatic lesions on this non CTA examination. 3. Interval increase in now small to moderate amount of intra-abdominal ascites, an expected finding post BRTO. 4. Incidentally noted solitary punctate (approximately 5 mm) nonobstructing right-sided renal stone. PLAN: The patient was seen in interventional radiology clinic in 1 month time with follow-up post BRT0 protocol CT scan and CMP level. Electronically Signed   By: Sandi Mariscal M.D.   On: 02/08/2016 13:49   Ir Angiogram Selective Each Additional Vessel  02/05/2016  CLINICAL DATA:  History of alcoholic cirrhosis, now with bleeding gastric varices. Patient presents for BRT0 of gastric varices. EXAM: 1. ULTRASOUND GUIDANCE FOR VENOUS ACCESS 2. LEFT RENAL VENOGRAM 3. SELECTIVE GASTRIC VARICEAL VENOGRAM AND PERCUTANEOUS SCLEROSANT EMBOLIZATION COMPARISON:  BRTO protocol CT- 01/24/2016 MEDICATIONS: None ANESTHESIA/SEDATION: General - as administered by the Anesthesia department CONTRAST:  100 cc Omnipaque 300 FLUOROSCOPY TIME:  Fluoroscopy Time: 33 minutes 54 seconds (3,624 mGy). COMPLICATIONS: None immediate. PROCEDURE: Informed written consent was obtained from the patient's wife after a thorough discussion of the procedural risks, benefits and alternatives. All questions were addressed. Maximal Sterile Barrier Technique was utilized including caps, mask, sterile gowns, sterile gloves, sterile drape, hand hygiene and skin antiseptic. A timeout was performed prior to the initiation of the procedure. The right groin was prepped and draped in usual sterile fashion. Under direct ultrasound guidance, the right common femoral vein was  accessed with a micropuncture kit. An ultrasound image was saved for procedural documentation purposes. Over a Bentson wire, the track was dilated allowing for placement of a 10 French TIPS sheath to the level of the mid IVC under intermittent fluoroscopic guidance. A Cobra catheter was utilized to select the left renal vein and a left renal venogram was performed. A Rosen wire was advanced into the caudal pole of the left kidney as the access sheath was advanced to the origin of left renal vein. Next, a 4 French angled glide catheter was cannulated within the sheath, next to the [place] wire, and with the use of a regular glidewire, was utilized to select the outflow origin of the hypertrophied gastric varix. The Rosen wire was then removed and over a coiled Amplatz wire, the vascular sheath was advanced into the outflow of the splenorenal shunt. Contrast injection confirmed appropriate positioning. Initially a Merci balloon, and ultimately, a 55 cm [place] scientific occlusion balloon was inflated and pulled caudal to the outflow confluence of the splenorenal shunt. With the outflow of the shunt occluded, multiple contrast and CO2 venograms were  performed in various obliquities to identify the anatomy of the hypertrophied gastric varices. Next, a high-flow micro catheter was advanced through the occlusion balloon into the caudal aspect of the markedly hypertrophied shunt. Multiple CO2 an contrast venograms were again performed to identify the anatomy of the hypertrophied gastric varices and to identify the inflow of a gastric varices. At this time, sclerosant was administered (in a mixture of 2 cc of Lipiodol, 4 cc of STS and 6 cc of air) under fluoroscopic guidance. Multiple spot fluoroscopic and radiographic images were obtained during the sclerosant administration. Given the patulous size of the outflow of the splenorenal shunt, the decision was made to leave the occlusion balloon in place overnight. The vascular  sheath and occlusion balloon were secured at the right groin entrance site with interrupted suture and Tegaderm. Dressings were placed. The patient tolerated the procedure well and remained hemodynamically stable throughout the procedure. FINDINGS: Initial left venogram demonstrates the outflow of markedly hypertrophied splenorenal shunt as was demonstrated on preceding BRTO protocol abdominal CT. Selective venograms demonstrates a markedly hypertrophied splenorenal shunt draining multiple markedly enlarged gastric varices. CO2 venogram adequately demonstrates the inflow of the gastric varices from the splenic vein. Note was made a solitary tiny right inferior phrenic escape vein. Following fluoroscopic guided sclerosant administration, the sclerosant appears to be contained within near the entirety of the gastric varices and splenorenal shunt without evidence of spillage into the splenic or portal veins and without evidence of extravasation. IMPRESSION: Technically successful BRTO procedure for bleeding gastric varices. PLAN: - The patient will return to the interventional radiology suite tomorrow morning for fluoroscopic guided removal of occlusion balloon an vascular sheath. - Postprocedural BRTO protocol abdominal CT will be obtained on the morning of 3/18. Electronically Signed   By: Sandi Mariscal M.D.   On: 02/05/2016 10:50   Ir Venogram Renal Uni Left  02/05/2016  CLINICAL DATA:  History of alcoholic cirrhosis, now with bleeding gastric varices. Patient presents for BRT0 of gastric varices. EXAM: 1. ULTRASOUND GUIDANCE FOR VENOUS ACCESS 2. LEFT RENAL VENOGRAM 3. SELECTIVE GASTRIC VARICEAL VENOGRAM AND PERCUTANEOUS SCLEROSANT EMBOLIZATION COMPARISON:  BRTO protocol CT- 01/24/2016 MEDICATIONS: None ANESTHESIA/SEDATION: General - as administered by the Anesthesia department CONTRAST:  100 cc Omnipaque 300 FLUOROSCOPY TIME:  Fluoroscopy Time: 33 minutes 54 seconds (3,624 mGy). COMPLICATIONS: None immediate.  PROCEDURE: Informed written consent was obtained from the patient's wife after a thorough discussion of the procedural risks, benefits and alternatives. All questions were addressed. Maximal Sterile Barrier Technique was utilized including caps, mask, sterile gowns, sterile gloves, sterile drape, hand hygiene and skin antiseptic. A timeout was performed prior to the initiation of the procedure. The right groin was prepped and draped in usual sterile fashion. Under direct ultrasound guidance, the right common femoral vein was accessed with a micropuncture kit. An ultrasound image was saved for procedural documentation purposes. Over a Bentson wire, the track was dilated allowing for placement of a 10 French TIPS sheath to the level of the mid IVC under intermittent fluoroscopic guidance. A Cobra catheter was utilized to select the left renal vein and a left renal venogram was performed. A Rosen wire was advanced into the caudal pole of the left kidney as the access sheath was advanced to the origin of left renal vein. Next, a 4 French angled glide catheter was cannulated within the sheath, next to the [place] wire, and with the use of a regular glidewire, was utilized to select the outflow origin of the hypertrophied gastric varix.  The Rosen wire was then removed and over a coiled Amplatz wire, the vascular sheath was advanced into the outflow of the splenorenal shunt. Contrast injection confirmed appropriate positioning. Initially a Merci balloon, and ultimately, a 71 cm [place] scientific occlusion balloon was inflated and pulled caudal to the outflow confluence of the splenorenal shunt. With the outflow of the shunt occluded, multiple contrast and CO2 venograms were performed in various obliquities to identify the anatomy of the hypertrophied gastric varices. Next, a high-flow micro catheter was advanced through the occlusion balloon into the caudal aspect of the markedly hypertrophied shunt. Multiple CO2 an contrast  venograms were again performed to identify the anatomy of the hypertrophied gastric varices and to identify the inflow of a gastric varices. At this time, sclerosant was administered (in a mixture of 2 cc of Lipiodol, 4 cc of STS and 6 cc of air) under fluoroscopic guidance. Multiple spot fluoroscopic and radiographic images were obtained during the sclerosant administration. Given the patulous size of the outflow of the splenorenal shunt, the decision was made to leave the occlusion balloon in place overnight. The vascular sheath and occlusion balloon were secured at the right groin entrance site with interrupted suture and Tegaderm. Dressings were placed. The patient tolerated the procedure well and remained hemodynamically stable throughout the procedure. FINDINGS: Initial left venogram demonstrates the outflow of markedly hypertrophied splenorenal shunt as was demonstrated on preceding BRTO protocol abdominal CT. Selective venograms demonstrates a markedly hypertrophied splenorenal shunt draining multiple markedly enlarged gastric varices. CO2 venogram adequately demonstrates the inflow of the gastric varices from the splenic vein. Note was made a solitary tiny right inferior phrenic escape vein. Following fluoroscopic guided sclerosant administration, the sclerosant appears to be contained within near the entirety of the gastric varices and splenorenal shunt without evidence of spillage into the splenic or portal veins and without evidence of extravasation. IMPRESSION: Technically successful BRTO procedure for bleeding gastric varices. PLAN: - The patient will return to the interventional radiology suite tomorrow morning for fluoroscopic guided removal of occlusion balloon an vascular sheath. - Postprocedural BRTO protocol abdominal CT will be obtained on the morning of 3/18. Electronically Signed   By: Sandi Mariscal M.D.   On: 02/05/2016 10:50   Ir Angiogram Follow Up Study  02/05/2016  CLINICAL DATA:   History of alcoholic cirrhosis, now with bleeding gastric varices. Patient presents for BRT0 of gastric varices. EXAM: 1. ULTRASOUND GUIDANCE FOR VENOUS ACCESS 2. LEFT RENAL VENOGRAM 3. SELECTIVE GASTRIC VARICEAL VENOGRAM AND PERCUTANEOUS SCLEROSANT EMBOLIZATION COMPARISON:  BRTO protocol CT- 01/24/2016 MEDICATIONS: None ANESTHESIA/SEDATION: General - as administered by the Anesthesia department CONTRAST:  100 cc Omnipaque 300 FLUOROSCOPY TIME:  Fluoroscopy Time: 33 minutes 54 seconds (3,624 mGy). COMPLICATIONS: None immediate. PROCEDURE: Informed written consent was obtained from the patient's wife after a thorough discussion of the procedural risks, benefits and alternatives. All questions were addressed. Maximal Sterile Barrier Technique was utilized including caps, mask, sterile gowns, sterile gloves, sterile drape, hand hygiene and skin antiseptic. A timeout was performed prior to the initiation of the procedure. The right groin was prepped and draped in usual sterile fashion. Under direct ultrasound guidance, the right common femoral vein was accessed with a micropuncture kit. An ultrasound image was saved for procedural documentation purposes. Over a Bentson wire, the track was dilated allowing for placement of a 10 French TIPS sheath to the level of the mid IVC under intermittent fluoroscopic guidance. A Cobra catheter was utilized to select the left renal vein and a  left renal venogram was performed. A Rosen wire was advanced into the caudal pole of the left kidney as the access sheath was advanced to the origin of left renal vein. Next, a 4 French angled glide catheter was cannulated within the sheath, next to the [place] wire, and with the use of a regular glidewire, was utilized to select the outflow origin of the hypertrophied gastric varix. The Rosen wire was then removed and over a coiled Amplatz wire, the vascular sheath was advanced into the outflow of the splenorenal shunt. Contrast injection  confirmed appropriate positioning. Initially a Merci balloon, and ultimately, a 106 cm [place] scientific occlusion balloon was inflated and pulled caudal to the outflow confluence of the splenorenal shunt. With the outflow of the shunt occluded, multiple contrast and CO2 venograms were performed in various obliquities to identify the anatomy of the hypertrophied gastric varices. Next, a high-flow micro catheter was advanced through the occlusion balloon into the caudal aspect of the markedly hypertrophied shunt. Multiple CO2 an contrast venograms were again performed to identify the anatomy of the hypertrophied gastric varices and to identify the inflow of a gastric varices. At this time, sclerosant was administered (in a mixture of 2 cc of Lipiodol, 4 cc of STS and 6 cc of air) under fluoroscopic guidance. Multiple spot fluoroscopic and radiographic images were obtained during the sclerosant administration. Given the patulous size of the outflow of the splenorenal shunt, the decision was made to leave the occlusion balloon in place overnight. The vascular sheath and occlusion balloon were secured at the right groin entrance site with interrupted suture and Tegaderm. Dressings were placed. The patient tolerated the procedure well and remained hemodynamically stable throughout the procedure. FINDINGS: Initial left venogram demonstrates the outflow of markedly hypertrophied splenorenal shunt as was demonstrated on preceding BRTO protocol abdominal CT. Selective venograms demonstrates a markedly hypertrophied splenorenal shunt draining multiple markedly enlarged gastric varices. CO2 venogram adequately demonstrates the inflow of the gastric varices from the splenic vein. Note was made a solitary tiny right inferior phrenic escape vein. Following fluoroscopic guided sclerosant administration, the sclerosant appears to be contained within near the entirety of the gastric varices and splenorenal shunt without evidence of  spillage into the splenic or portal veins and without evidence of extravasation. IMPRESSION: Technically successful BRTO procedure for bleeding gastric varices. PLAN: - The patient will return to the interventional radiology suite tomorrow morning for fluoroscopic guided removal of occlusion balloon an vascular sheath. - Postprocedural BRTO protocol abdominal CT will be obtained on the morning of 3/18. Electronically Signed   By: Sandi Mariscal M.D.   On: 02/05/2016 10:50   US Paracentesis  02/10/2016  INDICATION: Cirrhosis, recent hematemesis ,status post BRTO (balloon occluded transvenous obliteration of gastric varices ) on 02/05/16, ascites. Request made for diagnostic and therapeutic paracentesis. EXAM: ULTRASOUND GUIDED DIAGNOSTIC AND THERAPEUTIC PARACENTESIS MEDICATIONS: None. COMPLICATIONS: None immediate. PROCEDURE: Informed written consent was obtained from the patient after a discussion of the risks, benefits and alternatives to treatment. A timeout was performed prior to the initiation of the procedure. Initial ultrasound scanning demonstrates a large amount of ascites within the right lower abdominal quadrant. The right lower abdomen was prepped and draped in the usual sterile fashion. 1% lidocaine was used for local anesthesia. Following this, a Yueh catheter was introduced. An ultrasound image was saved for documentation purposes. The paracentesis was performed. The catheter was removed and a dressing was applied. The patient tolerated the procedure well without immediate post procedural complication. FINDINGS:  A total of approximately 6.2 liters of light yellow fluid was removed. Samples were sent to the laboratory as requested by the clinical team. IMPRESSION: Successful ultrasound-guided diagnostic and therapeutic paracentesis yielding 6.2 liters of peritoneal fluid. Read by: Rowe Robert, PA-C Electronically Signed   By: Lucrezia Europe M.D.   On: 02/10/2016 16:48   Ir Fluoro Rm 30-60  Min  02/05/2016  CLINICAL DATA:  Upper GI bleed, status post gastric varicenal sclerosant embolization. Occlusion balloon was left intact and inflated overnight postprocedure. No evidence of continued upper GI bleed. EXAM: IR FLOURO RM 0-60 MIN COMPARISON:  Previous day's exam TECHNIQUE: The right femoral venous sheath and catheter were prepped and draped in usual sterile fashion. Maximal barrier sterile technique was utilized including caps, mask, sterile gowns, sterile gloves, sterile drape, hand hygiene and skin antiseptic. Fluoroscopic inspection of the gastric varices showed stable appearance of the opacified sclerosis a material. The occlusion balloon remains inflated. Under fluoroscopic observation, the occlusion balloon was slowly deflated. There is no change in orientation or position of the opacified sclerosant material. The occlusion balloon catheter was retracted. The sclerosant material was stable in appearance. The venous sheath was removed and hemostasis achieved at the site. The patient tolerated the procedure well. IMPRESSION: 1. Stable position orientation of gastric variceal sclerosant material. The occlusion balloon and sheath were removed without incident. Electronically Signed   By: Lucrezia Europe M.D.   On: 02/05/2016 13:21   Ir US Guide Vasc Access Right  02/05/2016  CLINICAL DATA:  History of alcoholic cirrhosis, now with bleeding gastric varices. Patient presents for BRT0 of gastric varices. EXAM: 1. ULTRASOUND GUIDANCE FOR VENOUS ACCESS 2. LEFT RENAL VENOGRAM 3. SELECTIVE GASTRIC VARICEAL VENOGRAM AND PERCUTANEOUS SCLEROSANT EMBOLIZATION COMPARISON:  BRTO protocol CT- 01/24/2016 MEDICATIONS: None ANESTHESIA/SEDATION: General - as administered by the Anesthesia department CONTRAST:  100 cc Omnipaque 300 FLUOROSCOPY TIME:  Fluoroscopy Time: 33 minutes 54 seconds (3,624 mGy). COMPLICATIONS: None immediate. PROCEDURE: Informed written consent was obtained from the patient's wife after a  thorough discussion of the procedural risks, benefits and alternatives. All questions were addressed. Maximal Sterile Barrier Technique was utilized including caps, mask, sterile gowns, sterile gloves, sterile drape, hand hygiene and skin antiseptic. A timeout was performed prior to the initiation of the procedure. The right groin was prepped and draped in usual sterile fashion. Under direct ultrasound guidance, the right common femoral vein was accessed with a micropuncture kit. An ultrasound image was saved for procedural documentation purposes. Over a Bentson wire, the track was dilated allowing for placement of a 10 French TIPS sheath to the level of the mid IVC under intermittent fluoroscopic guidance. A Cobra catheter was utilized to select the left renal vein and a left renal venogram was performed. A Rosen wire was advanced into the caudal pole of the left kidney as the access sheath was advanced to the origin of left renal vein. Next, a 4 French angled glide catheter was cannulated within the sheath, next to the [place] wire, and with the use of a regular glidewire, was utilized to select the outflow origin of the hypertrophied gastric varix. The Rosen wire was then removed and over a coiled Amplatz wire, the vascular sheath was advanced into the outflow of the splenorenal shunt. Contrast injection confirmed appropriate positioning. Initially a Merci balloon, and ultimately, a 83 cm [place] scientific occlusion balloon was inflated and pulled caudal to the outflow confluence of the splenorenal shunt. With the outflow of the shunt occluded, multiple contrast and  CO2 venograms were performed in various obliquities to identify the anatomy of the hypertrophied gastric varices. Next, a high-flow micro catheter was advanced through the occlusion balloon into the caudal aspect of the markedly hypertrophied shunt. Multiple CO2 an contrast venograms were again performed to identify the anatomy of the hypertrophied  gastric varices and to identify the inflow of a gastric varices. At this time, sclerosant was administered (in a mixture of 2 cc of Lipiodol, 4 cc of STS and 6 cc of air) under fluoroscopic guidance. Multiple spot fluoroscopic and radiographic images were obtained during the sclerosant administration. Given the patulous size of the outflow of the splenorenal shunt, the decision was made to leave the occlusion balloon in place overnight. The vascular sheath and occlusion balloon were secured at the right groin entrance site with interrupted suture and Tegaderm. Dressings were placed. The patient tolerated the procedure well and remained hemodynamically stable throughout the procedure. FINDINGS: Initial left venogram demonstrates the outflow of markedly hypertrophied splenorenal shunt as was demonstrated on preceding BRTO protocol abdominal CT. Selective venograms demonstrates a markedly hypertrophied splenorenal shunt draining multiple markedly enlarged gastric varices. CO2 venogram adequately demonstrates the inflow of the gastric varices from the splenic vein. Note was made a solitary tiny right inferior phrenic escape vein. Following fluoroscopic guided sclerosant administration, the sclerosant appears to be contained within near the entirety of the gastric varices and splenorenal shunt without evidence of spillage into the splenic or portal veins and without evidence of extravasation. IMPRESSION: Technically successful BRTO procedure for bleeding gastric varices. PLAN: - The patient will return to the interventional radiology suite tomorrow morning for fluoroscopic guided removal of occlusion balloon an vascular sheath. - Postprocedural BRTO protocol abdominal CT will be obtained on the morning of 3/18. Electronically Signed   By: Sandi Mariscal M.D.   On: 02/05/2016 10:50   Dg Chest Port 1 View  02/07/2016  CLINICAL DATA:  Respiratory failure EXAM: PORTABLE CHEST 1 VIEW COMPARISON:  February 06, 2016 FINDINGS:  There is patchy bibasilar atelectasis, stable. No new opacity. Heart is upper normal in size with pulmonary vascularity within normal limits. No adenopathy. No bone lesions. IMPRESSION: Patchy bibasilar atelectasis, stable. No new opacity. No change in cardiac silhouette. Electronically Signed   By: Lowella Grip III M.D.   On: 02/07/2016 07:47   Dg Chest Port 1 View  02/06/2016  CLINICAL DATA:  Acute respiratory failure EXAM: PORTABLE CHEST 1 VIEW COMPARISON:  Chest radiograph from one day prior. FINDINGS: Interval extubation. Stable cardiomediastinal silhouette with normal heart size. No pneumothorax. No pleural effusion. No pulmonary edema. Curvilinear bibasilar lung opacities, significantly improved. IMPRESSION: Bibasilar curvilinear lung opacities, significantly improved, favor atelectasis. Electronically Signed   By: Ilona Sorrel M.D.   On: 02/06/2016 10:00   Portable Chest Xray  02/05/2016  CLINICAL DATA:  Encounter for intubation EXAM: PORTABLE CHEST 1 VIEW COMPARISON:  01/01/2016 FINDINGS: Endotracheal tube tip is just below the clavicular heads. Vascular pedicle widening which is newly seen. Normal heart size for technique. There is a hazy right basilar opacity. Streaky left basilar opacity suggesting atelectasis. No evidence of pneumothorax. No pulmonary edema. Newly seen embolic agent in the left upper quadrant. IMPRESSION: 1. Endotracheal tube in good position. 2. Right basilar opacity is likely combination of atelectasis and layering effusion. 3. Left basilar atelectasis. Electronically Signed   By: Monte Fantasia M.D.   On: 02/05/2016 02:06   [place] Guide Roadmapping  02/05/2016  CLINICAL DATA:  History of alcoholic cirrhosis, now with bleeding gastric varices. Patient presents for BRT0 of gastric varices. EXAM: 1. ULTRASOUND GUIDANCE FOR VENOUS ACCESS 2. LEFT RENAL VENOGRAM 3. SELECTIVE GASTRIC VARICEAL VENOGRAM AND PERCUTANEOUS SCLEROSANT EMBOLIZATION  COMPARISON:  BRTO protocol CT- 01/24/2016 MEDICATIONS: None ANESTHESIA/SEDATION: General - as administered by the Anesthesia department CONTRAST:  100 cc Omnipaque 300 FLUOROSCOPY TIME:  Fluoroscopy Time: 33 minutes 54 seconds (3,624 mGy). COMPLICATIONS: None immediate. PROCEDURE: Informed written consent was obtained from the patient's wife after a thorough discussion of the procedural risks, benefits and alternatives. All questions were addressed. Maximal Sterile Barrier Technique was utilized including caps, mask, sterile gowns, sterile gloves, sterile drape, hand hygiene and skin antiseptic. A timeout was performed prior to the initiation of the procedure. The right groin was prepped and draped in usual sterile fashion. Under direct ultrasound guidance, the right common femoral vein was accessed with a micropuncture kit. An ultrasound image was saved for procedural documentation purposes. Over a Bentson wire, the track was dilated allowing for placement of a 10 French TIPS sheath to the level of the mid IVC under intermittent fluoroscopic guidance. A Cobra catheter was utilized to select the left renal vein and a left renal venogram was performed. A Rosen wire was advanced into the caudal pole of the left kidney as the access sheath was advanced to the origin of left renal vein. Next, a 4 French angled glide catheter was cannulated within the sheath, next to the [place] wire, and with the use of a regular glidewire, was utilized to select the outflow origin of the hypertrophied gastric varix. The Rosen wire was then removed and over a coiled Amplatz wire, the vascular sheath was advanced into the outflow of the splenorenal shunt. Contrast injection confirmed appropriate positioning. Initially a Merci balloon, and ultimately, a 39 cm [place] scientific occlusion balloon was inflated and pulled caudal to the outflow confluence of the splenorenal shunt. With the outflow of the shunt occluded, multiple contrast and CO2  venograms were performed in various obliquities to identify the anatomy of the hypertrophied gastric varices. Next, a high-flow micro catheter was advanced through the occlusion balloon into the caudal aspect of the markedly hypertrophied shunt. Multiple CO2 an contrast venograms were again performed to identify the anatomy of the hypertrophied gastric varices and to identify the inflow of a gastric varices. At this time, sclerosant was administered (in a mixture of 2 cc of Lipiodol, 4 cc of STS and 6 cc of air) under fluoroscopic guidance. Multiple spot fluoroscopic and radiographic images were obtained during the sclerosant administration. Given the patulous size of the outflow of the splenorenal shunt, the decision was made to leave the occlusion balloon in place overnight. The vascular sheath and occlusion balloon were secured at the right groin entrance site with interrupted suture and Tegaderm. Dressings were placed. The patient tolerated the procedure well and remained hemodynamically stable throughout the procedure. FINDINGS: Initial left venogram demonstrates the outflow of markedly hypertrophied splenorenal shunt as was demonstrated on preceding BRTO protocol abdominal CT. Selective venograms demonstrates a markedly hypertrophied splenorenal shunt draining multiple markedly enlarged gastric varices. CO2 venogram adequately demonstrates the inflow of the gastric varices from the splenic vein. Note was made a solitary tiny right inferior phrenic escape vein. Following fluoroscopic guided sclerosant administration, the sclerosant appears to be contained within near the entirety of the gastric varices and splenorenal shunt without evidence of spillage into the splenic or portal veins and without evidence of extravasation. IMPRESSION: Technically  successful BRTO procedure for bleeding gastric varices. PLAN: - The patient will return to the interventional radiology suite tomorrow morning for fluoroscopic guided  removal of occlusion balloon an vascular sheath. - Postprocedural BRTO protocol abdominal CT will be obtained on the morning of 3/18. Electronically Signed   By: Sandi Mariscal M.D.   On: 02/05/2016 10:50        Filed Weights   02/11/16 0500 02/12/16 0300 02/13/16 0526  Weight: 144.7 kg (319 lb 0.1 oz) 144.87 kg (319 lb 6.1 oz) 143.72 kg (316 lb 13.5 oz)     Microbiology: Recent Results (from the past 240 hour(s))  MRSA PCR Screening     Status: None   Collection Time: 02/04/16  5:41 PM  Result Value Ref Range Status   MRSA by PCR NEGATIVE NEGATIVE Final    Comment:        The GeneXpert MRSA Assay (FDA approved for NASAL specimens only), is one component of a comprehensive MRSA colonization surveillance program. It is not intended to diagnose MRSA infection nor to guide or monitor treatment for MRSA infections.   Gram stain     Status: None   Collection Time: 02/10/16  5:56 PM  Result Value Ref Range Status   Specimen Description PERITONEAL ABDOMEN  Final   Special Requests NONE  Final   Gram Stain   Final    FEW WBC PRESENT,BOTH PMN AND MONONUCLEAR NO ORGANISMS SEEN    Report Status 02/10/2016 FINAL  Final  Culture, body fluid-bottle     Status: None (Preliminary result)   Collection Time: 02/10/16  5:56 PM  Result Value Ref Range Status   Specimen Description FLUID ABDOMEN PERITONEAL  Final   Special Requests BOTTLES DRAWN AEROBIC AND ANAEROBIC 10CC  Final   Culture NO GROWTH 3 DAYS  Final   Report Status PENDING  Incomplete       Blood Culture    Component Value Date/Time   SDES PERITONEAL ABDOMEN 02/10/2016 1756   SDES FLUID ABDOMEN PERITONEAL 02/10/2016 1756   SPECREQUEST NONE 02/10/2016 1756   SPECREQUEST BOTTLES DRAWN AEROBIC AND ANAEROBIC 10CC 02/10/2016 1756   CULT NO GROWTH 3 DAYS 02/10/2016 1756   REPTSTATUS 02/10/2016 FINAL 02/10/2016 1756   REPTSTATUS PENDING 02/10/2016 1756      Labs: Results for orders placed or performed during the  hospital encounter of 02/04/16 (from the past 48 hour(s))  CBC     Status: Abnormal   Collection Time: 02/12/16  8:57 AM  Result Value Ref Range   WBC 5.8 4.0 - 10.5 K/uL   RBC 2.78 (L) 4.22 - 5.81 MIL/uL   Hemoglobin 8.3 (L) 13.0 - 17.0 g/dL   HCT 25.9 (L) 39.0 - 52.0 %   MCV 93.2 78.0 - 100.0 fL   MCH 29.9 26.0 - 34.0 pg   MCHC 32.0 30.0 - 36.0 g/dL   RDW 16.7 (H) 11.5 - 15.5 %   Platelets 70 (L) 150 - 400 K/uL    Comment: CONSISTENT WITH PREVIOUS RESULT  Type and screen [place]     Status: None   Collection Time: 02/12/16 11:18 AM  Result Value Ref Range   ABO/RH(D) A NEG    Antibody Screen NEG    Sample Expiration 02/15/2016    Unit Number [id number]    Blood Component Type RED CELLS,LR    Unit division 00    Status of Unit ISSUED,FINAL    Transfusion Status OK TO TRANSFUSE    Crossmatch Result Compatible  Prepare RBC     Status: None   Collection Time: 02/12/16 11:18 AM  Result Value Ref Range   Order Confirmation ORDER PROCESSED BY BLOOD BANK   CBC     Status: Abnormal   Collection Time: 02/13/16  4:10 AM  Result Value Ref Range   WBC 6.0 4.0 - 10.5 K/uL   RBC 2.91 (L) 4.22 - 5.81 MIL/uL   Hemoglobin 8.6 (L) 13.0 - 17.0 g/dL   HCT 26.8 (L) 39.0 - 52.0 %   MCV 92.1 78.0 - 100.0 fL   MCH 29.6 26.0 - 34.0 pg   MCHC 32.1 30.0 - 36.0 g/dL   RDW 17.6 (H) 11.5 - 15.5 %   Platelets 69 (L) 150 - 400 K/uL    Comment: CONSISTENT WITH PREVIOUS RESULT  Comprehensive metabolic panel     Status: Abnormal   Collection Time: 02/13/16  4:10 AM  Result Value Ref Range   Sodium 136 135 - 145 mmol/L   Potassium 4.2 3.5 - 5.1 mmol/L   Chloride 100 (L) 101 - 111 mmol/L   CO2 28 22 - 32 mmol/L   Glucose, Bld 88 65 - 99 mg/dL   BUN 17 6 - 20 mg/dL   Creatinine, Ser 1.12 0.61 - 1.24 mg/dL   Calcium 7.9 (L) 8.9 - 10.3 mg/dL   Total Protein 5.6 (L) 6.5 - 8.1 g/dL   Albumin 2.5 (L) 3.5 - 5.0 g/dL   AST 99 (H) 15 - 41 U/L   ALT 77 (H) 17 - 63 U/L   Alkaline  Phosphatase 88 38 - 126 U/L   Total Bilirubin 2.1 (H) 0.3 - 1.2 mg/dL   GFR calc non Af Amer >60 >60 mL/min   GFR calc Af Amer >60 >60 mL/min    Comment: (NOTE) The eGFR has been calculated using the CKD EPI equation. This calculation has not been validated in all clinical situations. eGFR's persistently <60 mL/min signify possible Chronic Kidney Disease.    Anion gap 8 5 - 15     Lipid Panel     Component Value Date/Time   CHOL 157 06/02/2015 0635   TRIG 83 02/08/2016 0126   HDL 27* 06/02/2015 0635   CHOLHDL 5.8 06/02/2015 0635   VLDL 15 06/02/2015 0635   LDLCALC 115* 06/02/2015 0635     Lab Results  Component Value Date   HGBA1C 5.6 10/28/2014   HGBA1C 5.9* 11/29/2012     Lab Results  Component Value Date   LDLCALC 115* 06/02/2015   CREATININE 1.12 02/13/2016     HPI 60 y.o. male with a history of OSA, GERD, HLD, CAD s/p BMS to mLCx (10/2014), thrombocytopenia, PAF (isolated event in the setting of NSTEMI), and morbid obesity who was admitted to [facility] on 02/03/16 with hematemesis and found to have gastric varices 2/2 cirrhosis and was transferred to [facility] for planned BRTO by IR. Cardiology was consulted for help in management of ASA/plavix.  He does have a history of coronary artery disease and is status post bare-metal stenting of the mid left circumflex in December 2015 in the setting of NSTEMI. He had a relook catheterization in March 2016 which showed continued patency. He was re admitted to [facility] in 05/2015 with recurrent chest discomfort and ruled out for MI. Repeat catheterization was performed and again revealed minimal nonobstructive in-stent restenosis within the left circumflex and otherwise nonobstructive CAD. LV function was normal. He has been maintained on aspirin, Plavix, beta blocker, statin, and long-acting nitrate therapy.  He was in his usual state of health until last week when he had hematemesis and his wife called EMS. He initiallty presented to ED  at [facility] on 03/15 with the acute onset of hematemesis. On arrival to the ED he was hypotensive. Per his wife he vomited so much blood it filled 2 small trash cans. Blood was dark in color without any coffee grounds or clots.He had not had any similar symptoms in the past and denied heavy NSAID use. He drinks minimally now but does apparently have hx of heavy ETOH use back in the 90's. He was admitted initially to [facility].   GI consulted 03/15 for hematemesis. Had EGD that revealed large gastric varices but no active bleeding but felt to be at high risk for re-bleeding. Also required PRBC transfusion. Bleeding recurred AM 03/16. 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 Pt was subsequently transferred to [facility] ICU for procedure.  3/16 transferred to [facility] for BRTO - intubated/then extubated. He is now s/p successful BRTO early AM 3/17 per IR - was tx empirically w/ PPI and octreotide gtts - f/u CT abdom suggests no acute complications and no evidence of ongoing bleeding presently. He also had significant acites and underwent succuessful paracentesis yesterday. Cardiology was consulted for help in managing his antiplatelets.   HOSPITAL COURSE:   Hematemesis - bleeding gastric varices  S/p successful BRTO early AM 3/17 per IR - was tx empirically w/ PPI and octreotide gtts - f/u CT abdom suggests no acute complications - no evidence of ongoing bleeding presently   EGD at [facility] 3/15: Findings: - Esophagus. One column of small varix seen. Esophagitis and stricture not identified. - Gastric lumen. Old blood seen. 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 Mild PHG. - Duodenum. Bulb to D2 normal. Impression:  1. Recent upper GI bleeding from gastric varices. High risk for rebleeding.  Hg still trending down 8.1>7.3,  status post receiving 1 more unit on 3/24,  hemoglobin corrected to 8.6 Consulted Cardiology for recommendations regarding DAPT. Recommendations are to hold DAPT for one week then start aspirin and hold plavix Continue PPI twice a day   Cirrhosis - NASH + remote EtOH  Resumed maintenance medical tx - MELD is 39 - educated patient on need for very close follow-up - the patient is established with a primary care physician and states he has seen a GI doctor in [facility] - referral back to this GI physician, or a different physician should be arranged by his primary care physician as an outpatient - we will deal with the acute implications of his cirrhosis during his hospital stay                 Large volume ascites S/p paracentesis by IR 02/10/16  6.2 L (this was his first) continued diuretic/titration of outpatient medications Repeat ultrasound to assess amount of ascites prior to discharge Dose of Lasix has been increased to 40 mg twice a day, continue Aldactone  Acute blood loss anemia DAPT being held. Aspirin may be started in 1 week - reassess cbc next am.  Chronic thrombocytopenia Followed by Dr. 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 He has stable anatomy with minimal in-stent restenosis within the left circumflex. ASA and plavix currently held with hematemesis and bleeding esophageal varices.  Given ongoing anemia requiring transfusion, cardiology  has recommended to consider restarting antiplatelet therapy in one week if no further bleeding and if hemoglobin is  stable.  HLD Continue usual home med tx   PAF Normal sinus rhythm at present - full anticoag ill advised given liver disease  Hypernatremia resolved  SBP prophylaxis Received  Ceftriaxone for 5 days  Acute metabolic encephalopathy Resolved  Hx back pain  Obesity - Body mass index is 38.89 kg/(m^2).       Discharge Exam:    Blood pressure 117/55, pulse 85, temperature 98.1 F (36.7 C), temperature source Oral, resp. rate 18, height 6' 4" (1.93 m), weight 143.72 kg (316 lb 13.5 oz), SpO2 97 %.  General: No acute respiratory distress - alert and conversant Lungs: Clear to auscultation bilaterally - no wheezes or crackles, equal chest rise. Cardiovascular: Regular rate and rhythm without murmur gallop or rub  Abdomen: Nontender, distended , bowel sounds positive, no rebound, no appreciable mass Extremities: No significant cyanosis, or clubbing; 2+ edema bilateral lower extremities         Follow-up Information    Follow up with Sandi Mariscal, MD In 1 month.   Specialty:  Interventional Radiology   Why:  our office will call you   Contact information:   [address] [place] [address] [phone]       Follow up with Clearance Coots, MD. Schedule an appointment as soon as possible for a visit in 3 days.   Specialty:  Family Medicine   Why:  Post hospital follow-up, CBC   Contact information:   [place] [address] [phone]       Signed: Reyne Dumas 02/13/2016, 12:43 PM        Time spent >45 mins

## 2016-02-13 NOTE — Evaluation (Addendum)
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  Underwent BRTO at [facility]  Pt's PMH includes paroxysmal a-fib (isolated event in the setting of NSTEMI), morbid obesity, back pain, facial cellulitis, psoriasis.    Clinical Impression  Pt admitted with above diagnosis. Vincent Ochoa presents at Independent level of mobility w/ ambulation and stair navigation.  No instability w/ high level balance activities.  PT is singing off.    Follow Up Recommendations No PT follow up    Equipment Recommendations  None recommended by PT    Recommendations for Other Services       Precautions / Restrictions Precautions Precautions: None Restrictions Weight Bearing Restrictions: No      Mobility  Bed Mobility               General bed mobility comments: Pt sitting in chair upon PT arrival  Transfers Overall transfer level: Independent Equipment used: None             General transfer comment: No cues or physical assist needed  Ambulation/Gait Ambulation/Gait assistance: Independent Ambulation Distance (Feet): 300 Feet Assistive device: None Gait Pattern/deviations: WFL(Within Functional Limits)   Gait velocity interpretation: at or above normal speed for age/gender General Gait Details: WFL gait pattern, no outside assist needed.  No instability w/ high level balance activities listed below.  Stairs Stairs: Yes Stairs assistance: Independent Stair Management: One rail Right;Alternating pattern;Forwards Number of Stairs: 12 General stair comments: Holds onto Rt rail for support, no outside assist needed.  Wheelchair Mobility    Modified Rankin (Stroke Patients Only)       Balance Overall balance assessment: Independent                            High level balance activites: Head turns;Turns;Direction changes;Backward walking;Other (comment) (stepping over object) High Level Balance Comments: No instability w/ high level activities listed above             Pertinent Vitals/Pain Pain Assessment: No/denies pain    Home Living Family/patient expects to be discharged to:: Private residence Living Arrangements: Spouse/significant other Available Help at Discharge: Family;Available 24 hours/day Type of Home: House Home Access: Stairs to enter Entrance Stairs-Rails: Can reach both;Left;Right Entrance Stairs-Number of Steps: 4 Home Layout: Two level;Able to live on main level with bedroom/bathroom        Prior Function Level of Independence: Independent               Hand Dominance        Extremity/Trunk Assessment   Upper Extremity Assessment: Overall WFL for tasks assessed           Lower Extremity Assessment: Overall WFL for tasks assessed      Cervical / Trunk Assessment: Normal  Communication   Communication: No difficulties  Cognition Arousal/Alertness: Awake/alert Behavior During Therapy: WFL for tasks assessed/performed Overall Cognitive Status: Within Functional Limits for tasks assessed                      General Comments      Exercises General Exercises - Lower Extremity Ankle Circles/Pumps: AROM;Both;10 reps;Seated Long Arc Quad: AROM;Both;10 reps;Seated      Assessment/Plan    PT Assessment Patent does not need any  further PT services  PT Diagnosis Difficulty walking   PT Problem List    PT Treatment Interventions     PT Goals (Current goals can be found in the Care Plan section) Acute Rehab PT Goals Patient Stated Goal: to go home today PT Goal Formulation: All assessment and education complete, DC therapy    Frequency     Barriers to discharge        Co-evaluation               End of Session   Activity Tolerance: Patient tolerated treatment  well Patient left: in chair;with nursing/sitter in room (w/ nursing student in room) Nurse Communication: Mobility status         Time: [id number] PT Time Calculation (min) (ACUTE ONLY): 11 min   Charges:   PT Evaluation $PT Eval Low Complexity: 1 Procedure     PT G Codes:       Collie Siad PT, DPT  Pager: [phone] Phone: [phone] 02/13/2016, 3:14 PM

## 2016-02-13 NOTE — Progress Notes (Signed)
Discharge instructions gone over with patient. Home medications gone over. Prescriptions electronically sent to patients pharmacy. Diet, activity, and angiogram aftercare discussed. Patient verbalized understanding of instructions.

## 2016-02-13 NOTE — Progress Notes (Signed)
Patient ambulated 177ft on room air and maintained saturation of 97-99%.

## 2016-02-15 ENCOUNTER — Telehealth: Payer: Self-pay | Admitting: *Deleted

## 2016-02-15 ENCOUNTER — Other Ambulatory Visit (HOSPITAL_COMMUNITY): Payer: Self-pay | Admitting: Interventional Radiology

## 2016-02-15 DIAGNOSIS — K766 Portal hypertension: Secondary | ICD-10-CM

## 2016-02-15 DIAGNOSIS — K922 Gastrointestinal hemorrhage, unspecified: Secondary | ICD-10-CM

## 2016-02-15 DIAGNOSIS — I864 Gastric varices: Secondary | ICD-10-CM

## 2016-02-15 LAB — CULTURE, BODY FLUID W GRAM STAIN -BOTTLE: Culture: NO GROWTH

## 2016-02-15 LAB — CULTURE, BODY FLUID-BOTTLE

## 2016-02-15 NOTE — Telephone Encounter (Signed)
02/09/16 and 02/15/16 lmom to schedule f/u Ct, labs  and OV with Dr Johnney Killian

## 2016-02-16 ENCOUNTER — Emergency Department (HOSPITAL_COMMUNITY): Payer: Medicaid Other

## 2016-02-16 ENCOUNTER — Encounter (HOSPITAL_COMMUNITY): Payer: Self-pay | Admitting: *Deleted

## 2016-02-16 ENCOUNTER — Ambulatory Visit (INDEPENDENT_AMBULATORY_CARE_PROVIDER_SITE_OTHER): Payer: Medicaid Other | Admitting: Family Medicine

## 2016-02-16 ENCOUNTER — Inpatient Hospital Stay (HOSPITAL_COMMUNITY)
Admission: EM | Admit: 2016-02-16 | Discharge: 2016-02-19 | DRG: 432 | Disposition: A | Discharge status: Home / Self Care | Payer: Medicaid Other | Attending: Family Medicine | Admitting: Family Medicine

## 2016-02-16 VITALS — BP 116/52 | HR 89 | Temp 98.1°F | Wt 319.4 lb

## 2016-02-16 DIAGNOSIS — I864 Gastric varices: Secondary | ICD-10-CM | POA: Diagnosis present

## 2016-02-16 DIAGNOSIS — G8929 Other chronic pain: Secondary | ICD-10-CM | POA: Diagnosis present

## 2016-02-16 DIAGNOSIS — I81 Portal vein thrombosis: Secondary | ICD-10-CM | POA: Diagnosis not present

## 2016-02-16 DIAGNOSIS — Z955 Presence of coronary angioplasty implant and graft: Secondary | ICD-10-CM

## 2016-02-16 DIAGNOSIS — Z6838 Body mass index (BMI) 38.0-38.9, adult: Secondary | ICD-10-CM

## 2016-02-16 DIAGNOSIS — L409 Psoriasis, unspecified: Secondary | ICD-10-CM | POA: Diagnosis not present

## 2016-02-16 DIAGNOSIS — K219 Gastro-esophageal reflux disease without esophagitis: Secondary | ICD-10-CM | POA: Diagnosis present

## 2016-02-16 DIAGNOSIS — M549 Dorsalgia, unspecified: Secondary | ICD-10-CM | POA: Diagnosis present

## 2016-02-16 DIAGNOSIS — N179 Acute kidney failure, unspecified: Secondary | ICD-10-CM | POA: Diagnosis present

## 2016-02-16 DIAGNOSIS — K7031 Alcoholic cirrhosis of liver with ascites: Secondary | ICD-10-CM | POA: Diagnosis not present

## 2016-02-16 DIAGNOSIS — Z7982 Long term (current) use of aspirin: Secondary | ICD-10-CM

## 2016-02-16 DIAGNOSIS — D500 Iron deficiency anemia secondary to blood loss (chronic): Secondary | ICD-10-CM | POA: Diagnosis not present

## 2016-02-16 DIAGNOSIS — K5900 Constipation, unspecified: Secondary | ICD-10-CM | POA: Diagnosis present

## 2016-02-16 DIAGNOSIS — R0600 Dyspnea, unspecified: Secondary | ICD-10-CM | POA: Diagnosis not present

## 2016-02-16 DIAGNOSIS — I8500 Esophageal varices without bleeding: Secondary | ICD-10-CM | POA: Diagnosis present

## 2016-02-16 DIAGNOSIS — R188 Other ascites: Secondary | ICD-10-CM

## 2016-02-16 DIAGNOSIS — D696 Thrombocytopenia, unspecified: Secondary | ICD-10-CM | POA: Diagnosis present

## 2016-02-16 DIAGNOSIS — F1020 Alcohol dependence, uncomplicated: Secondary | ICD-10-CM | POA: Diagnosis present

## 2016-02-16 DIAGNOSIS — R252 Cramp and spasm: Secondary | ICD-10-CM | POA: Diagnosis not present

## 2016-02-16 DIAGNOSIS — I480 Paroxysmal atrial fibrillation: Secondary | ICD-10-CM | POA: Diagnosis present

## 2016-02-16 DIAGNOSIS — R180 Malignant ascites: Secondary | ICD-10-CM | POA: Diagnosis present

## 2016-02-16 DIAGNOSIS — I252 Old myocardial infarction: Secondary | ICD-10-CM

## 2016-02-16 DIAGNOSIS — I2510 Atherosclerotic heart disease of native coronary artery without angina pectoris: Secondary | ICD-10-CM | POA: Diagnosis present

## 2016-02-16 DIAGNOSIS — R1084 Generalized abdominal pain: Secondary | ICD-10-CM | POA: Diagnosis not present

## 2016-02-16 DIAGNOSIS — G4733 Obstructive sleep apnea (adult) (pediatric): Secondary | ICD-10-CM | POA: Diagnosis present

## 2016-02-16 DIAGNOSIS — M25562 Pain in left knee: Secondary | ICD-10-CM | POA: Diagnosis present

## 2016-02-16 DIAGNOSIS — Z87891 Personal history of nicotine dependence: Secondary | ICD-10-CM

## 2016-02-16 DIAGNOSIS — K766 Portal hypertension: Secondary | ICD-10-CM | POA: Diagnosis present

## 2016-02-16 DIAGNOSIS — E669 Obesity, unspecified: Secondary | ICD-10-CM | POA: Diagnosis present

## 2016-02-16 DIAGNOSIS — M25561 Pain in right knee: Secondary | ICD-10-CM | POA: Diagnosis present

## 2016-02-16 DIAGNOSIS — F329 Major depressive disorder, single episode, unspecified: Secondary | ICD-10-CM | POA: Diagnosis present

## 2016-02-16 LAB — URINALYSIS, ROUTINE W REFLEX MICROSCOPIC
BILIRUBIN URINE: NEGATIVE
GLUCOSE, UA: NEGATIVE mg/dL
Ketones, ur: NEGATIVE mg/dL
Leukocytes, UA: NEGATIVE
Nitrite: NEGATIVE
PROTEIN: NEGATIVE mg/dL
Specific Gravity, Urine: 1.008 (ref 1.005–1.030)
pH: 6 (ref 5.0–8.0)

## 2016-02-16 LAB — URINE MICROSCOPIC-ADD ON

## 2016-02-16 LAB — COMPREHENSIVE METABOLIC PANEL
ALK PHOS: 102 U/L (ref 38–126)
ALT: 90 U/L — ABNORMAL HIGH (ref 17–63)
AST: 106 U/L — ABNORMAL HIGH (ref 15–41)
Albumin: 2.6 g/dL — ABNORMAL LOW (ref 3.5–5.0)
Anion gap: 9 (ref 5–15)
BILIRUBIN TOTAL: 2.5 mg/dL — AB (ref 0.3–1.2)
BUN: 15 mg/dL (ref 6–20)
CALCIUM: 8.2 mg/dL — AB (ref 8.9–10.3)
CHLORIDE: 101 mmol/L (ref 101–111)
CO2: 27 mmol/L (ref 22–32)
CREATININE: 1.29 mg/dL — AB (ref 0.61–1.24)
GFR calc non Af Amer: 59 mL/min — ABNORMAL LOW (ref >60)
GLUCOSE: 117 mg/dL — AB (ref 65–99)
Potassium: 3.8 mmol/L (ref 3.5–5.1)
Sodium: 137 mmol/L (ref 135–145)
Total Protein: 6.5 g/dL (ref 6.5–8.1)

## 2016-02-16 LAB — LIPASE, BLOOD: Lipase: 55 U/L — ABNORMAL HIGH (ref 11–51)

## 2016-02-16 LAB — CBC
HCT: 28.3 % — ABNORMAL LOW (ref 39.0–52.0)
Hemoglobin: 9.3 g/dL — ABNORMAL LOW (ref 13.0–17.0)
MCH: 30.5 pg (ref 26.0–34.0)
MCHC: 32.9 g/dL (ref 30.0–36.0)
MCV: 92.8 fL (ref 78.0–100.0)
PLATELETS: 85 10*3/uL — AB (ref 150–400)
RBC: 3.05 MIL/uL — ABNORMAL LOW (ref 4.22–5.81)
RDW: 17 % — AB (ref 11.5–15.5)
WBC: 5.5 10*3/uL (ref 4.0–10.5)

## 2016-02-16 MED ORDER — CEFTRIAXONE SODIUM 2 G IJ SOLR
2.0000 g | Freq: Once | INTRAMUSCULAR | Status: AC
  Administered 2016-02-16: 2 g via INTRAVENOUS
  Filled 2016-02-16: qty 2

## 2016-02-16 MED ORDER — MORPHINE SULFATE (PF) 4 MG/ML IV SOLN
4.0000 mg | Freq: Once | INTRAVENOUS | Status: AC
  Administered 2016-02-16: 4 mg via INTRAVENOUS
  Filled 2016-02-16: qty 1

## 2016-02-16 MED ORDER — PANTOPRAZOLE SODIUM 40 MG IV SOLR
40.0000 mg | Freq: Once | INTRAVENOUS | Status: AC
  Administered 2016-02-16: 40 mg via INTRAVENOUS
  Filled 2016-02-16: qty 40

## 2016-02-16 MED ORDER — FUROSEMIDE 10 MG/ML IJ SOLN
40.0000 mg | Freq: Once | INTRAMUSCULAR | Status: AC
Start: 2016-02-16 — End: 2016-02-16
  Administered 2016-02-16: 40 mg via INTRAVENOUS
  Filled 2016-02-16: qty 4

## 2016-02-16 MED ORDER — IOPAMIDOL (ISOVUE-300) INJECTION 61%
INTRAVENOUS | Status: AC
  Administered 2016-02-16: 22:00:00
  Filled 2016-02-16: qty 100

## 2016-02-16 MED ORDER — ONDANSETRON HCL 4 MG/2ML IJ SOLN
4.0000 mg | Freq: Once | INTRAMUSCULAR | Status: AC
  Administered 2016-02-16: 4 mg via INTRAVENOUS
  Filled 2016-02-16: qty 2

## 2016-02-16 NOTE — ED Notes (Signed)
Pt also given blankets and different wheel chair to make conformable while sitting in waiting room prior to comment.

## 2016-02-16 NOTE — ED Provider Notes (Signed)
CSN: [id number]     Arrival date & time 02/16/16  1104 History   First MD Initiated Contact with Patient 02/16/16 1615     No chief complaint on file.   HPI   Mr. Bithell is an 60 y.o. male with history of cirrhosis, chronic thrombocytopenia, chronic ascites, CAD, paroxysmal afib, who presents to the ED for evaluation of abdominal pain and distension. Pt was discharged on 3/25 after admission for variceal bleed and is s/p BRTO. During his admission pt had IR guided paracentesis on 3/22 with 6.2L of fluid removed. He was on rocephin for SBP prophy during his admission but no abx therapy at home. He states that he has not felt well since discharge and feel his abdomen started swelling the day after discharge. States that he has associate severe diffuse abdominal pain that radiates to his left flank. He states that he feels much more distended this his baseline ascites. Pt reports his legs are also much more swollen than they typically are. States that he has felt constipated since his hospital admission, but had a small BM this morning. Endorses intermittent nausea today but denies emesis. States he is urinating less than normal. Reports he is taking all meds as prescribed. DAPT therapy held since admission. Pt was seen at [facility] today and sent to the ED for evaluation and possible admission.   Past Medical History  Diagnosis Date  . GERD (gastroesophageal reflux disease)   . Psoriasis   . Frequent headaches   . Facial cellulitis 2014    Periorbital  . Coronary artery disease     a. Evaluated by Dr. Stanford Breed 2012 - nonobstructive 2007-2008 by cath. b. Abnormal stress test 10/2014 but cath deferred temporarily due to thrombocytopenia. c. 10/2014: readm with AF-RVR/NSTEMI - s/p BMS to mLCx 11/04/14 (mod dz in RCA);  d. relook cath 01/2015 and 06/02/2015 patent stent, nonobs CAD, EF 55-65%.  . Back pain, lumbosacral 2014  . Thrombocytopenia ([place])     a. Onset unclear, but noted 2012. ?  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 s/p prednisone, IVIG.  Marland Kitchen Hyperlipidemia   . Obesity   . Elevated LFTs   . Fatty liver US - 2012  . PAF (paroxysmal atrial fibrillation) ([place])     a. Episode 10/2014 at time of NSTEMI, spont converted to NSR. Observation for further episodes (not on anticoag at present time due to [facility] = 1, thrombocytopenia).  . Sinus bradycardia   . OSA (obstructive sleep apnea)     severe with AHI 56/hr with successful CPAP titration to 18cm H2O  . Morbid obesity [facility])    Past Surgical History  Procedure Laterality Date  . Tonsillectomy  1960's  . Cholecystectomy  2007  . Knee arthroplasty  1970's  . Facial cosmetic surgery  2014  . Cardiac catheterization    . Left heart catheterization with coronary angiogram N/A 11/04/2014    Procedure: LEFT HEART CATHETERIZATION WITH CORONARY ANGIOGRAM;  Surgeon: Jettie Booze, MD;  Location: [facility] CATH LAB;  Service: Cardiovascular;  Laterality: N/A;  . Left heart catheterization with coronary angiogram N/A 01/28/2015    Procedure: LEFT HEART CATHETERIZATION WITH CORONARY ANGIOGRAM;  Surgeon: Sherren Mocha, MD;  Location: [facility] CATH LAB;  Service: Cardiovascular;  Laterality: N/A;  . Cardiac catheterization N/A 06/02/2015    Procedure: Left Heart Cath and Coronary Angiography;  Surgeon: Jettie Booze, MD;  Location: [facility] CV LAB;  Service: Cardiovascular;  Laterality: N/A;  .  Radiology with anesthesia N/A 02/04/2016    Procedure: RADIOLOGY WITH ANESTHESIA;  Surgeon: Medication Radiologist, MD;  Location: [place];  Service: Radiology;  Laterality: N/A;   Family History  Problem Relation Age of Onset  . Arthritis Maternal Grandmother   . Heart disease Sister   . Diabetes Sister   . Cancer - Ovarian Sister   . Heart disease      Maternal/paternal grandparents  . Cirrhosis Mother   . Heart attack Mother   . Heart attack Maternal Aunt   . Hypertension Mother   . Hypertension  Father   . Stroke Father    Social History  Substance Use Topics  . Smoking status: Former Smoker -- 0.20 packs/day for 43 years    Types: Cigarettes  . Smokeless tobacco: Never Used     Comment: 11/28/2012 "chew occasionally"  Quit for 2.5 months in early 2016  . Alcohol Use: 0.0 oz/week    0 Standard drinks or equivalent per week     Comment: 11/28/2012 "beer q 6 months or so"    Review of Systems  All other systems reviewed and are negative.     Allergies  Review of patient's allergies indicates no known allergies.  Home Medications   Prior to Admission medications   Medication Sig Start Date End Date Taking? Authorizing Provider  aspirin 81 MG tablet Take 1 tablet (81 mg total) by mouth daily. 02/22/16   Reyne Dumas, MD  atorvastatin (LIPITOR) 10 MG tablet Take 1 tablet (10 mg total) by mouth every evening. Patient not taking: Reported on 01/01/2016 12/16/14   Sherren Mocha, MD  buPROPion (WELLBUTRIN XL) 150 MG 24 hr tablet Take 150 mg by mouth daily.    Historical Provider, MD  clopidogrel (PLAVIX) 75 MG tablet Take 1 tablet (75 mg total) by mouth daily. 02/22/16   Reyne Dumas, MD  cyclobenzaprine (FLEXERIL) 10 MG tablet Take 1 tablet (10 mg total) by mouth 3 (three) times daily as needed for muscle spasms. Patient not taking: Reported on 01/01/2016 09/14/15   Rosemarie Ax, MD  docusate sodium (COLACE) 100 MG capsule Take 1 capsule (100 mg total) by mouth 2 (two) times daily. 02/13/16   Reyne Dumas, MD  furosemide (LASIX) 20 MG tablet Take 2 tablets (40 mg total) by mouth 2 (two) times daily. 02/13/16   Reyne Dumas, MD  HYDROcodone-acetaminophen (NORCO/VICODIN) 5-325 MG tablet Take 1 tablet by mouth every 6 (six) hours as needed for moderate pain or severe pain. 02/03/16   Rosemarie Ax, MD  isosorbide mononitrate (IMDUR) 30 MG 24 hr tablet Take 1 tablet (30 mg total) by mouth daily. 02/03/16   Rosemarie Ax, MD  metoprolol succinate (TOPROL-XL) 25 MG 24 hr tablet Take 0.5  tablets (12.5 mg total) by mouth daily. 02/26/15   Sherren Mocha, MD  nitroGLYCERIN (NITROSTAT) 0.4 MG SL tablet Place 1 tablet (0.4 mg total) under the tongue every 5 (five) minutes as needed for chest pain (up to 3 doses). 10/31/14   Dayna N Dunn, PA-C  pantoprazole (PROTONIX) 40 MG tablet Take 1 tablet (40 mg total) by mouth 2 (two) times daily. 02/13/16   Reyne Dumas, MD  spironolactone (ALDACTONE) 25 MG tablet Take 1 tablet (25 mg total) by mouth daily. 02/13/16   Reyne Dumas, MD   BP 121/58 mmHg  Pulse 82  Temp(Src) 98.4 F (36.9 C) (Oral)  Resp 20  SpO2 96% Physical Exam  Constitutional: He is oriented to person, place, and time.  Obese,  moaning in bed  HENT:  Right Ear: External ear normal.  Left Ear: External ear normal.  Nose: Nose normal.  Mouth/Throat: Oropharynx is clear and moist.  Eyes: Conjunctivae and EOM are normal.  Neck: Normal range of motion. Neck supple.  Cardiovascular: Normal rate, regular rhythm, normal heart sounds and intact distal pulses.   Pulmonary/Chest: Effort normal and breath sounds normal. No respiratory distress. He has no wheezes. He exhibits no tenderness.  Abdominal: Bowel sounds are normal.  Abdomen is distended, taut. Markedly diffusely ttp with mild guarding, though worse on left side compared to right. BS NL.   Musculoskeletal:  2+ pitting edema in bilat LE from knee to foot  Neurological: He is alert and oriented to person, place, and time. No cranial nerve deficit.  Skin: Skin is warm and dry.  Psychiatric: He has a normal mood and affect.  Nursing note and vitals reviewed.   ED Course  Procedures (including critical care time) Labs Review Labs Reviewed  LIPASE, BLOOD - Abnormal; Notable for the following:    Lipase 55 (*)    All other components within normal limits  COMPREHENSIVE METABOLIC PANEL - Abnormal; Notable for the following:    Glucose, Bld 117 (*)    Creatinine, Ser 1.29 (*)    Calcium 8.2 (*)    Albumin 2.6 (*)     AST 106 (*)    ALT 90 (*)    Total Bilirubin 2.5 (*)    GFR calc non Af Amer 59 (*)    All other components within normal limits  CBC - Abnormal; Notable for the following:    RBC 3.05 (*)    Hemoglobin 9.3 (*)    HCT 28.3 (*)    RDW 17.0 (*)    Platelets 85 (*)    All other components within normal limits  URINALYSIS, ROUTINE W REFLEX MICROSCOPIC (NOT AT [facility]) - Abnormal; Notable for the following:    APPearance CLOUDY (*)    Hgb urine dipstick SMALL (*)    All other components within normal limits  URINE MICROSCOPIC-ADD ON - Abnormal; Notable for the following:    Squamous Epithelial / LPF 0-5 (*)    Bacteria, UA RARE (*)    Casts HYALINE CASTS (*)    All other components within normal limits    Imaging Review Ct Abdomen Pelvis W Contrast  02/16/2016  CLINICAL DATA:  60 year old male with abdominal pain and distention. EXAM: CT ABDOMEN AND PELVIS WITH CONTRAST TECHNIQUE: Multidetector CT imaging of the abdomen and pelvis was performed using the standard protocol following bolus administration of intravenous contrast. CONTRAST:  1 ISOVUE-300 IOPAMIDOL (ISOVUE-300) INJECTION 61% COMPARISON:  CT dated 02/08/2016 FINDINGS: Trace right pleural effusion. No intra-abdominal free air.  Small ascites similar prior study. Cirrhosis. Cholecystectomy. The pancreas appears unremarkable. Splenomegaly measuring 17 cm. The adrenal glands appear unremarkable. There is a 4 mm nonobstructing right renal inferior pole calculus. No hydronephrosis. The visualized ureters urinary bladder appear unremarkable. The prostate and seminal vesicles are grossly unremarkable. There is a 1.5 cm diverticulum arising from the medial wall of the second portion of the duodenum. There is no evidence of bowel obstruction. No definite active inflammatory changes of the bowel identified, however evaluation is limited due to ascites. The appendix appears unremarkable. Mild aortoiliac atherosclerotic disease. There is a 3.2 cm  infrarenal abdominal aortic aneurysm similar prior study. No portal venous gas identified. Ill-defined vague hypodensity in the main portal vein is likely artifactual related mixing of the contrast.  The thrombus is less likely. Stable upper abdominal and gastric varices status post BRTO with sclerosant material material within the gastric versus similar the prior study. A dilated gastric varix segment along the lesser curvature of the stomach at the gastroesophageal junction remains patent. There is no adenopathy. There is mild diffuse stranding of the subcutaneous soft tissues of the abdominal wall. Mild digit changes of the spine. No acute fracture. IMPRESSION: Cirrhosis with evidence of portal hypertension, splenomegaly, and small ascites similar prior study. Stable appearing post procedural changes of BRTO with sclerosant material in the gastric varices similar to prior study. A mildly dilated gastric varices along the lesser curvature of the stomach at the gastroesophageal junction remains patent. No evidence of bowel obstruction. Small nonobstructing right renal inferior pole calculus. Electronically Signed   By: Anner Crete M.D.   On: 02/16/2016 22:12   I have personally reviewed and evaluated these images and lab results as part of my medical decision-making.   EKG Interpretation None      MDM   Final diagnoses:  Ascites  Generalized abdominal pain    Triage labs reviewed. No white count. Pt is afebrile. However, will initiate empiric SBP prophy with rocephin. Cr slightly elevated above baseline to 1.29. Hgb 9.3, Platelets 85 which is improved from prior. Pt states during his admission he required 5U PRBC. Baseline plt 40-70k. Will order pain meds, protonix, lasix. Obtain CT abd/pelvis.   Delay in meds and CT due to bed availability. Pt was eventually moved to a room, IV access was difficult but was obtained. CT shows ascites similar to prior studies, cirrhosis w/ portal HTN,  splenomegaly. There is stable BRTO changes. No e/o SBO or other acute/emergent intra-abdominal pathology.   I discussed case with attending MD Vanita Panda. At this time CT is stable, pt is afebrile with no white count. However, pt does not feel comfortable going home and waiting for outpatient IR f/u. I think it is reasonable for overnight admission for therapeutic tap in the morning, possibly continuing IV abx for SBP prophylaxis.   Spoke to family medicine who will admit pt.   Anne Ng, PA-C 02/16/16 2302  Carmin Muskrat, MD 02/16/16 [phone]

## 2016-02-16 NOTE — Progress Notes (Signed)
Patient ID: Vincent Ochoa, male   DOB: 10-08-1956, 60 y.o.   MRN: [id number]   [facility] Aquilla Hacker, MD Phone: [phone]  Subjective:   # Abdominal Pain - pt. Is here after being discharged from the hospital on 3/25.  - He had been admitted with bleeding Gastric and Esophageal Varices, he had encephalopathy at that time.  - He had a BRTO procedure performed on 3/17.   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  - He has chronic ascites with recent paracentesis 3/22. 6.2 L removed at that time.  - He received SBP ppx Ceftriaxone stopped on 3/22.  - He has not been on prophylaxis since leaving the hospital.  - He takes Lasix 40BID - Aldactone daily.  - He developed worsening abdominal pain and what he describes as constipation over the past 48 hours.  - he says that his abdomen is becoming increasingly distended. This is the worst that it has ever been, he cannot even button his pants, and he used to be able to button them even with his ascites.  - He has also had worsening lower extremity edema. - He has not had fever, chills, nausea, or vomiting.  - He has tenderness predominantly on the left side of his abdomen and in his back.  All relevant systems were reviewed and were negative unless otherwise noted in the HPI  Past Medical History Reviewed problem list.  Medications- reviewed and updated Current Outpatient Prescriptions  Medication Sig Dispense Refill  . [START ON 02/22/2016] aspirin 81 MG tablet Take 1 tablet (81 mg total) by mouth daily. 30 tablet   . atorvastatin (LIPITOR) 10 MG tablet Take 1 tablet (10 mg total) by mouth every evening. (Patient not taking: Reported on 01/01/2016) 90 tablet 3  . buPROPion (WELLBUTRIN XL) 150 MG 24 hr tablet Take 150 mg by mouth daily.    Derrill Memo ON 02/22/2016] clopidogrel (PLAVIX) 75 MG tablet Take 1 tablet (75 mg total) by mouth daily. 30 tablet 7  . cyclobenzaprine (FLEXERIL) 10 MG tablet Take 1 tablet (10  mg total) by mouth 3 (three) times daily as needed for muscle spasms. (Patient not taking: Reported on 01/01/2016) 90 tablet 1  . docusate sodium (COLACE) 100 MG capsule Take 1 capsule (100 mg total) by mouth 2 (two) times daily. 10 capsule 0  . furosemide (LASIX) 20 MG tablet Take 2 tablets (40 mg total) by mouth 2 (two) times daily. 60 tablet 3  . HYDROcodone-acetaminophen (NORCO/VICODIN) 5-325 MG tablet Take 1 tablet by mouth every 6 (six) hours as needed for moderate pain or severe pain. 20 tablet 0  . isosorbide mononitrate (IMDUR) 30 MG 24 hr tablet Take 1 tablet (30 mg total) by mouth daily. 90 tablet 1  . metoprolol succinate (TOPROL-XL) 25 MG 24 hr tablet Take 0.5 tablets (12.5 mg total) by mouth daily. 30 tablet 11  . nitroGLYCERIN (NITROSTAT) 0.4 MG SL tablet Place 1 tablet (0.4 mg total) under the tongue every 5 (five) minutes as needed for chest pain (up to 3 doses). 25 tablet 3  . pantoprazole (PROTONIX) 40 MG tablet Take 1 tablet (40 mg total) by mouth 2 (two) times daily. 60 tablet 2  . spironolactone (ALDACTONE) 25 MG tablet Take 1 tablet (25 mg total) by mouth daily. 30 tablet 0   No current facility-administered medications for this visit.   Chief complaint-noted No additions to family history Social history- patient is a non smoker  Objective: BP 116/52 mmHg  Pulse 89  Temp(Src) 98.1 F (36.7 C) (Oral)  Wt 319 lb 6.4 oz (144.879 kg)  SpO2 94% Gen: NAD, alert, cooperative with exam HEENT: NCAT, EOMI, PERRL Neck: FROM, supple CV: RRR, good S1/S2, no murmur Resp: CTABL, no wheezes, non-labored Abd: Taught, Grossly distended, Tenderness predominantly on the left side. Too tender with palpation to even assess rebound. Tenderness over his flank bilaterally as well. + BS audible, Unable to assess organomegaly.  Ext: 3+ edema to the knee, warm, normal tone, moves UE/LE spontaneously Neuro: Alert and oriented, No gross deficits Skin: spider angiomata.    Assessment/Plan:  # Abdominal Pain - pt. With significantly worse ascites, at risk for this given recent BRTO procedure. Also at risk for SBP given recent tap and invasive procedures. Vitals otherwise normal / stable here. He is hepatic encephalopathy Grade I - Sent to the ED from clinic.  - He will likely need repeat tap and studies of ascitic fluid.  - may require admission.  - Consider SBP empiric treatment.  - No follow up with IR after BRTO until 4/20.  - pt. Aware that it is very important that he go to the ED for further evaluation at this time.

## 2016-02-16 NOTE — ED Notes (Signed)
Pt was admitted for hemorrhaging for varices on his liver.  Pt is retaining fluid and stomach feels hard to patient.  Pt sent here from MD office to have fluid drained and repeat another scan.  Not vomiting or pooping blood. PT is having abdominal and lower back pain. Physician who sent him here feels he may have an infection in his stomach.

## 2016-02-16 NOTE — ED Notes (Signed)
Pt has been re-asst and Nurse First is looking over his results.  Wife of pt came to desk concerned about what was taking so long.  Explained to her that we were doing the best we could and that we have no room currently in the back.  She stated that his doctor had called and they were suppose to go back to room once they arrived to ER.  Advised her that he was triaged a acuity 2 and that he was at the "top of the list".

## 2016-02-16 NOTE — ED Notes (Signed)
While i was talking to another pt in waiting room.  Pt told pt I was talking "dont believe nothing that man says, hes a lier."  EMT Jeneen Rinks stated "I have not lied to you one time sir."

## 2016-02-16 NOTE — Patient Instructions (Signed)
Thanks for coming in today.   I am concerned that you have so much fluid on board, and I am concerned that your ascites is so much worse since leaving the hospital.   They will need to perform paracentesis at the hospital. I am also concerned that you may have an infection. You need to go to the ER to get an evaluation for this.   You may be required to be admitted to the hospital.   Thanks for letting us take care of you.   Sincerely, Paula Compton, MD Family Medicine - PGY 2

## 2016-02-16 NOTE — ED Notes (Signed)
Patient transported to CT 

## 2016-02-16 NOTE — H&P (Signed)
[facility] Admission History and Physical Service Pager: [phone]  Patient name: Vincent Ochoa Medical record number: [id number] Date of birth: [birth year] Age: 60 y.o. Gender: male  Primary Care Provider: Clearance Coots, MD Consultants: IR (in AM)  Code Status: FULL   Chief Complaint: Worsening Abdominal Distention   Assessment and Plan: SHATEEK TILLAR is a 60 y.o. male presenting with worsening abdominal distention. PMH is significant for cirrhosis, chronic ascites, gastric varices, chronic thrombocytopenia, CAD, paroxysmal Afib, chronic pain, OSA, and depression.   Abdominal Ascites: In the setting of cirrhosis 2/2 to alcoholism. Patient with chronic ascites with recent paracentesis 3/22 with removal of 6.5L. With significantly worsening ascites clinically and abdominal pain. Concern given recent BRTO procedures. CT abdomen obtained in ED which showed small ascites similar to prior studies, abdominal wall inflammation cirrhosis with portal HTN, and splenomegaly. Stable BRTO changes. No evidence of SBO. Paitent is afebrile and without leukocytosis. Given Rocephin for SBP prophylaxis. Some concern for venous occlusion given overall presenting symptom of tenderness to LUQ and splenomegaly on CT scan.  -admit to telemetry; vital signs per unit  -Lasix 40 mg IV daily (currently taking 40 mg PO at home)  -morphine 1 mg q3h prn for abdominal pain  -continue home spironolactone 25 mg daily  -CMET in AM  -magnesium, phosphorus, and PT/INR ordered  -continue Rocephin for SBP treatment -consult IR in AM for possible paracentesis and ? Further workup of for potential venous occlusion - Little ascites on CT, but clinically very taught abdomen with 3+ edema to the knees, can't button his pants which is new for him along with marked abdominal tenderness and guarding. Could be due to below problem as well.    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 Gastric / Esophageal  varices. S/P BRTO Gastric varices as above. Thrombocytopenia, poor synthetic liver function. Ascites.  - Continue treatment as above.  - Holding metoprolol - was on aldactone, lasix at home.  - No propranolol given already on metoprolol, and also not on SBP ppx.   Dyspnea: ? 2/2 HF Exacerbation vs related to increasing abdominal distention. Patient on O2 by [place] for comfort. Had previously had stable O2 saturation on RA. Some mild crackles on lung exam concerning for possible pulmonary edema. Echo 12/15 with G2DD, in the setting of LE edema, dyspnea, DG Chest here with some pulmonary edema, HF may be contributing.  -BNP ordered  - Consider repeat Echo in the am - Consider further risk stratification workup as well if indicated.  - Obtain EKG.  - DG chest 2 view with mild pulmonary edema.   Recent H/o Upper GI Bleed from Gastric Varices: Patient recently discharged on 3/25. Had EGD that revealed large gastric varices but no active bleeding but felt to be at high risk for re-bleeding.Required transfusion with 5uPRBC. Patient subsequently had re-bleeding and BRTO was performed. Was also treated with PPI and octreotide gtts. HgB currently stable at 9.3. No signs of active bleeding at admission.  -holding ASA and plavix given recent bleed (patient instructed to resume 4/3 from previous hospital d/c)  -continue to monitor HgB  -IV Protonix while NPO   AKI: Mild. Cr 1.29 at admission (bl 0.7-1).  -monitor Cr  - Continue lasix for now.  - Careful not to precipitate hepatorenal syndrome.   Chronic Thrombocytopenia: Followed by Dr. Alvy Bimler. Baseline platelet count 40-70k. Platelets 85 at admission.  -continue to monitor on CBC   CAD: Status post prior bare-metal stenting of  the left circumflex in December 2015 with relook catheterization in March 2016 and again July 2016. He has stable anatomy with minimal in-stent restenosis within the left circumflex. -continue home Imdur 40 mg daily  -holding  home metoprolol   Paroxysmal AFib: Currently in NSR. Has not been on anticoagulation due to bleeding risk.   Chronic Pain: In back and knees. Occasionally takes Norco at home for pain relief. Patient encephalopathic at previous hospital admission and given his severe liver disease, will be careful with narcotic use while he is hospitalized.   OSA: Good compliance with CPAP at home.  -CPAP ordered   Depression: Appropriate affect at admission. -continue home Wellbutrin XR 150 mg daily   Constipation: Having regular BMs but reports they are small and he subjectively feels constipated.  -colace 100 mg BID  -Miralax daily prn    FEN/GI: NPO, SLIV  Prophylaxis: SCDs   Disposition: Admit to FPTS; Attending [place]   History of Present Illness:  Vincent Ochoa is a 60 y.o. male presenting with worsening of chronic abdominal ascites. Started having worsening of abdominal ascites during previous hospitalization. Ascites continued to worsen after discharge. Yesterday, started to become very uncomfortable. Had abdominal pain and SOB. Using his CPAP machine all day long at home. Reports LE edema. Occasional acid reflux type chest pain. No fevers, nausea or vomiting. Having small bowel movements but feeling constipated. Last BM was this AM.   In the ED, received Zofran, IV Protonix, Morphine 4 mg IV , IV lasix 40 mg, and Rocephin. CT scan of abdomen was performed.   Review Of Systems: Per HPI with the following additions: None  Otherwise the remainder of the systems were negative.  Patient Active Problem List   Diagnosis Date Noted  . Ascites 02/16/2016  . Abdominal distention   . Alcoholic cirrhosis of liver with ascites ([place])   . Bleeding gastric varices   . OSA on CPAP   . Esophageal varices with bleeding ([place])   . Acute respiratory failure ([place])   . GI bleed 02/04/2016  . Gastrointestinal hemorrhage 02/04/2016  . Blood loss anemia   . Acute respiratory failure with hypoxia ([place])   .  Encephalopathy acute   . Feeling down 01/10/2016  . Chest pain 01/02/2016  . Diastolic heart failure ([place]) 10/07/2015  . Cellulitis of leg, left 10/07/2015  . Leukopenia 09/15/2015  . Acrochordon 07/18/2015  . Coronary artery disease   . PAF (paroxysmal atrial fibrillation) ([place])   . Morbid obesity ([place])   . Leg swelling 04/23/2015  . Transaminitis 04/23/2015  . Knee pain, bilateral 03/05/2015  . Depression 01/08/2015  . OSA (obstructive sleep apnea)   . Tobacco abuse 12/08/2014  . Physical deconditioning 12/08/2014  . Paroxysmal atrial fibrillation ([place]) 11/05/2014  . Hyperlipidemia LDL goal <70 11/05/2014  . Obesity 11/05/2014  . NSTEMI (non-ST elevated myocardial infarction) ([place])   . Back pain, lumbosacral 01/04/2013  . Thrombocytopenia ([place]) 11/29/2012  . Decreased libido 08/15/2011  . GERD (gastroesophageal reflux disease) 08/15/2011  . CAD (coronary artery disease) 07/29/2011  . Psoriasis 07/29/2011    Past Medical History: Past Medical History  Diagnosis Date  . GERD (gastroesophageal reflux disease)   . Psoriasis   . Frequent headaches   . Facial cellulitis 2014    Periorbital  . Coronary artery disease     a. Evaluated by Dr. Stanford Breed 2012 - nonobstructive 2007-2008 by cath. b. Abnormal stress test 10/2014 but cath deferred temporarily due to thrombocytopenia. c. 10/2014: readm with  AF-RVR/NSTEMI - s/p BMS to mLCx 11/04/14 (mod dz in RCA);  d. relook cath 01/2015 and 06/02/2015 patent stent, nonobs CAD, EF 55-65%.  . Back pain, lumbosacral 2014  . Thrombocytopenia ([place])     a. Onset unclear, but noted 2012. ? 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 s/p prednisone, IVIG.  Marland Kitchen Hyperlipidemia   . Obesity   . Elevated LFTs   . Fatty liver US - 2012  . PAF (paroxysmal atrial fibrillation) ([place])     a. Episode 10/2014 at time of NSTEMI, spont converted to NSR. Observation for further episodes (not on anticoag at present time due to  [facility] = 1, thrombocytopenia).  . Sinus bradycardia   . OSA (obstructive sleep apnea)     severe with AHI 56/hr with successful CPAP titration to 18cm H2O  . Morbid obesity ([place])     Past Surgical History: Past Surgical History  Procedure Laterality Date  . Tonsillectomy  1960's  . Cholecystectomy  2007  . Knee arthroplasty  1970's  . Facial cosmetic surgery  2014  . Cardiac catheterization    . Left heart catheterization with coronary angiogram N/A 11/04/2014    Procedure: LEFT HEART CATHETERIZATION WITH CORONARY ANGIOGRAM;  Surgeon: Jettie Booze, MD;  Location: [facility] CATH LAB;  Service: Cardiovascular;  Laterality: N/A;  . Left heart catheterization with coronary angiogram N/A 01/28/2015    Procedure: LEFT HEART CATHETERIZATION WITH CORONARY ANGIOGRAM;  Surgeon: Sherren Mocha, MD;  Location: [facility] CATH LAB;  Service: Cardiovascular;  Laterality: N/A;  . Cardiac catheterization N/A 06/02/2015    Procedure: Left Heart Cath and Coronary Angiography;  Surgeon: Jettie Booze, MD;  Location: [place] CV LAB;  Service: Cardiovascular;  Laterality: N/A;  . Radiology with anesthesia N/A 02/04/2016    Procedure: RADIOLOGY WITH ANESTHESIA;  Surgeon: Medication Radiologist, MD;  Location: [place];  Service: Radiology;  Laterality: N/A;    Social History: Social History  Substance Use Topics  . Smoking status: Former Smoker -- 0.20 packs/day for 43 years    Types: Cigarettes  . Smokeless tobacco: Never Used     Comment: 11/28/2012 "chew occasionally"  Quit for 2.5 months in early 2016  . Alcohol Use: 0.0 oz/week    0 Standard drinks or equivalent per week     Comment: 11/28/2012 "beer q 6 months or so"   Additional social history: None   Please also refer to relevant sections of EMR.  Family History: Family History  Problem Relation Age of Onset  . Arthritis Maternal Grandmother   . Heart disease Sister   . Diabetes Sister   . Cancer - Ovarian Sister   . Heart disease       Maternal/paternal grandparents  . Cirrhosis Mother   . Heart attack Mother   . Heart attack Maternal Aunt   . Hypertension Mother   . Hypertension Father   . Stroke Father     Allergies and Medications: No Known Allergies No current facility-administered medications on file prior to encounter.   Current Outpatient Prescriptions on File Prior to Encounter  Medication Sig Dispense Refill  . [START ON 02/22/2016] aspirin 81 MG tablet Take 1 tablet (81 mg total) by mouth daily. 30 tablet   . buPROPion (WELLBUTRIN XL) 150 MG 24 hr tablet Take 150 mg by mouth daily.    Derrill Memo ON 02/22/2016] clopidogrel (PLAVIX) 75 MG tablet Take 1 tablet (75 mg total) by mouth daily. 30 tablet 7  . cyclobenzaprine (FLEXERIL)  10 MG tablet Take 1 tablet (10 mg total) by mouth 3 (three) times daily as needed for muscle spasms. 90 tablet 1  . docusate sodium (COLACE) 100 MG capsule Take 1 capsule (100 mg total) by mouth 2 (two) times daily. 10 capsule 0  . furosemide (LASIX) 20 MG tablet Take 2 tablets (40 mg total) by mouth 2 (two) times daily. 60 tablet 3  . isosorbide mononitrate (IMDUR) 30 MG 24 hr tablet Take 1 tablet (30 mg total) by mouth daily. 90 tablet 1  . metoprolol succinate (TOPROL-XL) 25 MG 24 hr tablet Take 0.5 tablets (12.5 mg total) by mouth daily. 30 tablet 11  . nitroGLYCERIN (NITROSTAT) 0.4 MG SL tablet Place 1 tablet (0.4 mg total) under the tongue every 5 (five) minutes as needed for chest pain (up to 3 doses). 25 tablet 3  . pantoprazole (PROTONIX) 40 MG tablet Take 1 tablet (40 mg total) by mouth 2 (two) times daily. 60 tablet 2  . spironolactone (ALDACTONE) 25 MG tablet Take 1 tablet (25 mg total) by mouth daily. 30 tablet 0    Objective: BP 114/58 mmHg  Pulse 82  Temp(Src) 98.4 F (36.9 C) (Oral)  Resp 17  SpO2 98% Exam: General: Obese male lying in bed, non-toxic appearing   Eyes: EOMI.  ENTM: Poor dentition. Oropharynx clear.  Neck: Full ROM.  Cardiovascular: RRR. No murmurs  appreciated. 2+ distal pulses. 2+ pitting edema to knees.  Respiratory: [place] in place. Occasional bibasilar crackle appreciated. Normal WOB. No wheezes.  Abdomen: Distended and taut. Diffusely tender in the upper quadrants L > R. Mild guarding.  MSK: Moves all extremities spontaneously.  Skin: Warm and dry.  Neuro: Alert and oriented. No gross deficits.  Psych: Normal mood and affect.   Labs and Imaging: CBC BMET   Recent Labs Lab 02/16/16 1203  WBC 5.5  HGB 9.3*  HCT 28.3*  PLT 85*    Recent Labs Lab 02/16/16 1203  NA 137  K 3.8  CL 101  CO2 27  BUN 15  CREATININE 1.29*  GLUCOSE 117*  CALCIUM 8.2*     Ct Abdomen Pelvis W Contrast  02/16/2016  CLINICAL DATA:  60 year old male with abdominal pain and distention. EXAM: CT ABDOMEN AND PELVIS WITH CONTRAST TECHNIQUE: Multidetector CT imaging of the abdomen and pelvis was performed using the standard protocol following bolus administration of intravenous contrast. CONTRAST:  1 ISOVUE-300 IOPAMIDOL (ISOVUE-300) INJECTION 61% COMPARISON:  CT dated 02/08/2016 FINDINGS: Trace right pleural effusion. No intra-abdominal free air.  Small ascites similar prior study. Cirrhosis. Cholecystectomy. The pancreas appears unremarkable. Splenomegaly measuring 17 cm. The adrenal glands appear unremarkable. There is a 4 mm nonobstructing right renal inferior pole calculus. No hydronephrosis. The visualized ureters urinary bladder appear unremarkable. The prostate and seminal vesicles are grossly unremarkable. There is a 1.5 cm diverticulum arising from the medial wall of the second portion of the duodenum. There is no evidence of bowel obstruction. No definite active inflammatory changes of the bowel identified, however evaluation is limited due to ascites. The appendix appears unremarkable. Mild aortoiliac atherosclerotic disease. There is a 3.2 cm infrarenal abdominal aortic aneurysm similar prior study. No portal venous gas identified. Ill-defined vague  hypodensity in the main portal vein is likely artifactual related mixing of the contrast. The thrombus is less likely. Stable upper abdominal and gastric varices status post BRTO with sclerosant material material within the gastric versus similar the prior study. A dilated gastric varix segment along the lesser curvature of the  stomach at the gastroesophageal junction remains patent. There is no adenopathy. There is mild diffuse stranding of the subcutaneous soft tissues of the abdominal wall. Mild digit changes of the spine. No acute fracture. IMPRESSION: Cirrhosis with evidence of portal hypertension, splenomegaly, and small ascites similar prior study. Stable appearing post procedural changes of BRTO with sclerosant material in the gastric varices similar to prior study. A mildly dilated gastric varices along the lesser curvature of the stomach at the gastroesophageal junction remains patent. No evidence of bowel obstruction. Small nonobstructing right renal inferior pole calculus. Electronically Signed   By: Anner Crete M.D.   On: 02/16/2016 22:12    Nicolette Bang, DO 02/16/2016, 11:03 PM PGY-1, [place] Intern pager: [phone], text pages welcome  I agree with the above evaluation, assessment, and plan. Any correctional changes can be noted in [facility].   Aquilla Hacker, MD Family Medicine Resident - PGY 2

## 2016-02-17 ENCOUNTER — Other Ambulatory Visit: Payer: Self-pay

## 2016-02-17 ENCOUNTER — Observation Stay (HOSPITAL_COMMUNITY): Payer: Medicaid Other

## 2016-02-17 DIAGNOSIS — I864 Gastric varices: Secondary | ICD-10-CM | POA: Diagnosis present

## 2016-02-17 DIAGNOSIS — R1084 Generalized abdominal pain: Secondary | ICD-10-CM | POA: Diagnosis not present

## 2016-02-17 DIAGNOSIS — I81 Portal vein thrombosis: Secondary | ICD-10-CM | POA: Insufficient documentation

## 2016-02-17 DIAGNOSIS — M25561 Pain in right knee: Secondary | ICD-10-CM | POA: Diagnosis present

## 2016-02-17 DIAGNOSIS — F1020 Alcohol dependence, uncomplicated: Secondary | ICD-10-CM | POA: Diagnosis present

## 2016-02-17 DIAGNOSIS — I2510 Atherosclerotic heart disease of native coronary artery without angina pectoris: Secondary | ICD-10-CM | POA: Diagnosis present

## 2016-02-17 DIAGNOSIS — Z7982 Long term (current) use of aspirin: Secondary | ICD-10-CM | POA: Diagnosis not present

## 2016-02-17 DIAGNOSIS — R188 Other ascites: Secondary | ICD-10-CM | POA: Diagnosis not present

## 2016-02-17 DIAGNOSIS — F329 Major depressive disorder, single episode, unspecified: Secondary | ICD-10-CM | POA: Diagnosis present

## 2016-02-17 DIAGNOSIS — E669 Obesity, unspecified: Secondary | ICD-10-CM | POA: Diagnosis present

## 2016-02-17 DIAGNOSIS — K7031 Alcoholic cirrhosis of liver with ascites: Secondary | ICD-10-CM | POA: Diagnosis present

## 2016-02-17 DIAGNOSIS — M549 Dorsalgia, unspecified: Secondary | ICD-10-CM | POA: Diagnosis present

## 2016-02-17 DIAGNOSIS — Z87891 Personal history of nicotine dependence: Secondary | ICD-10-CM | POA: Diagnosis not present

## 2016-02-17 DIAGNOSIS — M25562 Pain in left knee: Secondary | ICD-10-CM | POA: Diagnosis present

## 2016-02-17 DIAGNOSIS — Z6838 Body mass index (BMI) 38.0-38.9, adult: Secondary | ICD-10-CM | POA: Diagnosis not present

## 2016-02-17 DIAGNOSIS — R252 Cramp and spasm: Secondary | ICD-10-CM | POA: Diagnosis not present

## 2016-02-17 DIAGNOSIS — K219 Gastro-esophageal reflux disease without esophagitis: Secondary | ICD-10-CM | POA: Diagnosis present

## 2016-02-17 DIAGNOSIS — K766 Portal hypertension: Secondary | ICD-10-CM | POA: Diagnosis present

## 2016-02-17 DIAGNOSIS — R0600 Dyspnea, unspecified: Secondary | ICD-10-CM | POA: Insufficient documentation

## 2016-02-17 DIAGNOSIS — Z955 Presence of coronary angioplasty implant and graft: Secondary | ICD-10-CM | POA: Diagnosis not present

## 2016-02-17 DIAGNOSIS — G4733 Obstructive sleep apnea (adult) (pediatric): Secondary | ICD-10-CM | POA: Diagnosis present

## 2016-02-17 DIAGNOSIS — N179 Acute kidney failure, unspecified: Secondary | ICD-10-CM | POA: Diagnosis present

## 2016-02-17 DIAGNOSIS — G8929 Other chronic pain: Secondary | ICD-10-CM | POA: Diagnosis present

## 2016-02-17 DIAGNOSIS — K5900 Constipation, unspecified: Secondary | ICD-10-CM | POA: Diagnosis present

## 2016-02-17 DIAGNOSIS — I252 Old myocardial infarction: Secondary | ICD-10-CM | POA: Diagnosis not present

## 2016-02-17 DIAGNOSIS — I8500 Esophageal varices without bleeding: Secondary | ICD-10-CM | POA: Diagnosis present

## 2016-02-17 DIAGNOSIS — D696 Thrombocytopenia, unspecified: Secondary | ICD-10-CM | POA: Diagnosis present

## 2016-02-17 DIAGNOSIS — I480 Paroxysmal atrial fibrillation: Secondary | ICD-10-CM | POA: Diagnosis present

## 2016-02-17 LAB — COMPREHENSIVE METABOLIC PANEL
ALT: 80 U/L — ABNORMAL HIGH (ref 17–63)
ANION GAP: 8 (ref 5–15)
AST: 91 U/L — ABNORMAL HIGH (ref 15–41)
Albumin: 2.4 g/dL — ABNORMAL LOW (ref 3.5–5.0)
Alkaline Phosphatase: 89 U/L (ref 38–126)
BILIRUBIN TOTAL: 1.7 mg/dL — AB (ref 0.3–1.2)
BUN: 15 mg/dL (ref 6–20)
CHLORIDE: 103 mmol/L (ref 101–111)
CO2: 27 mmol/L (ref 22–32)
Calcium: 8 mg/dL — ABNORMAL LOW (ref 8.9–10.3)
Creatinine, Ser: 1.3 mg/dL — ABNORMAL HIGH (ref 0.61–1.24)
GFR, EST NON AFRICAN AMERICAN: 59 mL/min — AB (ref >60)
Glucose, Bld: 110 mg/dL — ABNORMAL HIGH (ref 65–99)
POTASSIUM: 3.6 mmol/L (ref 3.5–5.1)
Sodium: 138 mmol/L (ref 135–145)
TOTAL PROTEIN: 6 g/dL — AB (ref 6.5–8.1)

## 2016-02-17 LAB — BODY FLUID CELL COUNT WITH DIFFERENTIAL
Eos, Fluid: 1 %
LYMPHS FL: 28 %
MONOCYTE-MACROPHAGE-SEROUS FLUID: 61 % (ref 50–90)
NEUTROPHIL FLUID: 10 % (ref 0–25)
Total Nucleated Cell Count, Fluid: 168 cu mm (ref 0–1000)

## 2016-02-17 LAB — CBC
HEMATOCRIT: 28.6 % — AB (ref 39.0–52.0)
Hemoglobin: 8.9 g/dL — ABNORMAL LOW (ref 13.0–17.0)
MCH: 29.2 pg (ref 26.0–34.0)
MCHC: 31.1 g/dL (ref 30.0–36.0)
MCV: 93.8 fL (ref 78.0–100.0)
Platelets: 82 10*3/uL — ABNORMAL LOW (ref 150–400)
RBC: 3.05 MIL/uL — AB (ref 4.22–5.81)
RDW: 17 % — AB (ref 11.5–15.5)
WBC: 4.9 10*3/uL (ref 4.0–10.5)

## 2016-02-17 LAB — GLUCOSE, SEROUS FLUID: GLUCOSE FL: 135 mg/dL

## 2016-02-17 LAB — MAGNESIUM: MAGNESIUM: 1.8 mg/dL (ref 1.7–2.4)

## 2016-02-17 LAB — ALBUMIN, FLUID (OTHER): Albumin, Fluid: <1 g/dL

## 2016-02-17 LAB — LACTATE DEHYDROGENASE: LDH: 329 U/L — AB (ref 98–192)

## 2016-02-17 LAB — PROTIME-INR
INR: 1.57 — AB (ref 0.00–1.49)
Prothrombin Time: 18.8 seconds — ABNORMAL HIGH (ref 11.6–15.2)

## 2016-02-17 LAB — GRAM STAIN

## 2016-02-17 LAB — GLUCOSE, CAPILLARY: GLUCOSE-CAPILLARY: 103 mg/dL — AB (ref 65–99)

## 2016-02-17 LAB — LACTATE DEHYDROGENASE, PLEURAL OR PERITONEAL FLUID: LD FL: 42 U/L — AB (ref 3–23)

## 2016-02-17 LAB — BRAIN NATRIURETIC PEPTIDE: B NATRIURETIC PEPTIDE 5: 18 pg/mL (ref 0.0–100.0)

## 2016-02-17 LAB — PHOSPHORUS: PHOSPHORUS: 4.1 mg/dL (ref 2.5–4.6)

## 2016-02-17 MED ORDER — ASPIRIN EC 81 MG PO TBEC
81.0000 mg | DELAYED_RELEASE_TABLET | Freq: Every day | ORAL | Status: DC
  Administered 2016-02-18 – 2016-02-19 (×2): 81 mg via ORAL
  Filled 2016-02-17 (×3): qty 1

## 2016-02-17 MED ORDER — SPIRONOLACTONE 50 MG PO TABS
100.0000 mg | ORAL_TABLET | Freq: Every day | ORAL | Status: DC
  Administered 2016-02-17 – 2016-02-18 (×2): 100 mg via ORAL
  Filled 2016-02-17 (×2): qty 2

## 2016-02-17 MED ORDER — FUROSEMIDE 10 MG/ML IJ SOLN
40.0000 mg | Freq: Every day | INTRAMUSCULAR | Status: DC
  Administered 2016-02-18: 40 mg via INTRAVENOUS
  Filled 2016-02-17: qty 4

## 2016-02-17 MED ORDER — PANTOPRAZOLE SODIUM 40 MG IV SOLR: 40.0000 mg | INTRAVENOUS | Status: DC

## 2016-02-17 MED ORDER — LIDOCAINE HCL (PF) 1 % IJ SOLN
INTRAMUSCULAR | Status: AC
  Filled 2016-02-17: qty 10

## 2016-02-17 MED ORDER — POLYETHYLENE GLYCOL 3350 17 G PO PACK: 17.0000 g | PACK | Freq: Every day | ORAL | Status: DC | PRN

## 2016-02-17 MED ORDER — BUPROPION HCL ER (XL) 150 MG PO TB24
150.0000 mg | ORAL_TABLET | Freq: Every day | ORAL | Status: DC
  Administered 2016-02-17 – 2016-02-19 (×3): 150 mg via ORAL
  Filled 2016-02-17 (×3): qty 1

## 2016-02-17 MED ORDER — DOCUSATE SODIUM 100 MG PO CAPS
100.0000 mg | ORAL_CAPSULE | Freq: Two times a day (BID) | ORAL | Status: DC
  Administered 2016-02-17 – 2016-02-18 (×4): 100 mg via ORAL
  Filled 2016-02-17 (×6): qty 1

## 2016-02-17 MED ORDER — NITROGLYCERIN 0.4 MG SL SUBL: 0.4000 mg | SUBLINGUAL_TABLET | SUBLINGUAL | Status: DC | PRN

## 2016-02-17 MED ORDER — POLYETHYLENE GLYCOL 3350 17 G PO PACK
17.0000 g | PACK | Freq: Two times a day (BID) | ORAL | Status: DC
  Administered 2016-02-17: 17 g via ORAL
  Filled 2016-02-17 (×5): qty 1

## 2016-02-17 MED ORDER — MORPHINE SULFATE (PF) 2 MG/ML IV SOLN
1.0000 mg | INTRAVENOUS | Status: DC | PRN
  Administered 2016-02-17 – 2016-02-19 (×5): 1 mg via INTRAVENOUS
  Filled 2016-02-17 (×5): qty 1

## 2016-02-17 MED ORDER — ISOSORBIDE MONONITRATE ER 30 MG PO TB24
30.0000 mg | ORAL_TABLET | Freq: Every day | ORAL | Status: DC
  Administered 2016-02-17 – 2016-02-19 (×3): 30 mg via ORAL
  Filled 2016-02-17 (×3): qty 1

## 2016-02-17 MED ORDER — DEXTROSE 5 % IV SOLN
2.0000 g | INTRAVENOUS | Status: DC
  Administered 2016-02-17: 2 g via INTRAVENOUS
  Filled 2016-02-17 (×2): qty 2

## 2016-02-17 MED ORDER — SODIUM CHLORIDE 0.9% FLUSH
3.0000 mL | Freq: Two times a day (BID) | INTRAVENOUS | Status: DC
  Administered 2016-02-17 – 2016-02-18 (×4): 3 mL via INTRAVENOUS

## 2016-02-17 MED ORDER — SPIRONOLACTONE 25 MG PO TABS: 25.0000 mg | ORAL_TABLET | Freq: Every day | ORAL | Status: DC

## 2016-02-17 MED ORDER — PANTOPRAZOLE SODIUM 40 MG IV SOLR
40.0000 mg | Freq: Two times a day (BID) | INTRAVENOUS | Status: DC
  Administered 2016-02-17 – 2016-02-18 (×2): 40 mg via INTRAVENOUS
  Filled 2016-02-17 (×2): qty 40

## 2016-02-17 NOTE — Progress Notes (Addendum)
Pharmacy Antibiotic Note  Vincent Ochoa is a 60 y.o. male admitted on 02/16/2016 with Ascites.  Pharmacy has been consulted for Ceftriaxone dosing for SBP. WBC WNL. Other labs reviewed.   Plan: -Ceftriaxone 2g IV q24h d/t obesity -pharmacy to sign off as ceftriaxone does not require dose adjustment  Height: 6\' 4"  (193 cm) Weight: (!) 319 lb 9.6 oz (144.97 kg) IBW/kg (Calculated) : 86.8  Temp (24hrs), Avg:98.3 F (36.8 C), Min:98.1 F (36.7 C), Max:98.5 F (36.9 C)   Recent Labs Lab 02/10/16 1135 02/11/16 0320 02/12/16 0857 02/13/16 0410 02/16/16 1203  WBC 5.9 4.5 5.8 6.0 5.5  CREATININE  --  1.09  --  1.12 1.29*    Estimated Creatinine Clearance: 96 mL/min (by C-G formula based on Cr of 1.29).    No Known Allergies  Ceriah Kohler D. Zakira Ressel, PharmD, BCPS Clinical Pharmacist Pager: [phone] 02/17/2016 11:12 AM

## 2016-02-17 NOTE — Procedures (Signed)
Successful US guided paracentesis from LLQ.  Yielded 8.4 liters of clear yellow fluid.  No immediate complications.  Pt tolerated well.   Specimen was sent for labs.  Tattianna Schnarr S Tanesia Butner PA-C 02/17/2016 2:48 PM

## 2016-02-17 NOTE — Progress Notes (Signed)
Attempted report. Waiting for call back.  Baleria Wyman I, RN 

## 2016-02-17 NOTE — Progress Notes (Signed)
Family Medicine Teaching Service Daily Progress Note Intern Pager: [phone]  Patient name: Vincent Ochoa Medical record number: [id number] Date of birth: 10/01/1956 Age: 60 y.o. Gender: male  Primary Care Provider: Clearance Coots, MD Consultants: IR  Code Status: FULL   Pt Overview and Major Events to Date:  3/28: Patient admitted for worsening abdominal ascites and abdominal pain   Assessment and Plan: Vincent Ochoa is a 60 y.o. male presenting with worsening abdominal distention. PMH is significant for cirrhosis, chronic ascites, gastric varices, chronic thrombocytopenia, CAD, paroxysmal Afib, chronic pain, OSA, and depression.   Abdominal Ascites: In the setting of cirrhosis 2/2 to alcoholism. Patient with chronic ascites with recent paracentesis 3/22 with removal of 6.5L. With significantly worsening ascites clinically and abdominal pain. Concern given recent BRTO procedures. CT abdomen obtained in ED which showed small ascites similar to prior studies, abdominal wall inflammation cirrhosis with portal HTN, and splenomegaly. Stable BRTO changes. No evidence of SBO. Paitent is afebrile and without leukocytosis. Given Rocephin for SBP prophylaxis. Some concern for portal venous thrombosis given overall presenting symptom of tenderness to LUQ and splenomegaly on CT scan. PT mildly elevated to 1.57.  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 Gastric / Esophageal varices. S/P BRTO Gastric varices as above. Thrombocytopenia, poor synthetic liver function. Ascites.  - Continue treatment as above.  - Holding metoprolol >>> consider switching to propanolol  - was on aldactone, lasix at home.   Dyspnea: Likely related to increasing abdominal distention. Patient on O2 by Shelly for comfort. Had previously had stable O2 saturation on RA. Some mild crackles on lung exam concerning for possible pulmonary edema. Echo 12/15 with G2DD, in the setting of LE edema, dyspnea, DG Chest here with some pulmonary edema, HF may be contributing however less likely given BNP of 18.  -BNP ordered  - Consider repeat Echo  - Obtain EKG: NSR    Recent H/o Upper GI Bleed from Gastric Varices: Patient recently discharged on 3/25. Had EGD that revealed large gastric varices but no active bleeding but felt to be at high risk for re-bleeding.Required transfusion with 5uPRBC. Patient subsequently had re-bleeding and BRTO was performed. Was also treated with PPI and octreotide gtts. HgB currently stable at 9.3. No signs of active bleeding at admission.  -holding ASA and plavix given recent bleed (patient instructed to resume 4/3 from previous hospital d/c)  -continue to monitor HgB  -IV Protonix while NPO   AKI: Mild. Cr 1.29 at admission (bl 0.7-1).  -monitor Cr  - Continue lasix for now.  - Careful not to precipitate hepatorenal syndrome.   Chronic Thrombocytopenia: Followed by Dr. Alvy Bimler. Baseline platelet count 40-70k. Platelets 85 at admission.  -continue to monitor on CBC   CAD: Status post prior bare-metal stenting of the left circumflex in December 2015 with relook catheterization in March 2016 and again July 2016. He has stable anatomy with minimal in-stent  restenosis within the left circumflex. -continue home Imdur 40 mg daily  -holding home metoprolol   Paroxysmal AFib: Currently in NSR. Has not been on  anticoagulation due to bleeding risk.   Chronic Pain: In back and knees. Occasionally takes Norco at home for pain relief. Patient encephalopathic at previous hospital admission and given his severe liver disease, will be careful with narcotic use while he is hospitalized.   OSA: Good compliance with CPAP at home.  -CPAP ordered   Depression: Appropriate affect at admission. -continue home Wellbutrin XR 150 mg daily   Constipation: Having regular BMs but reports they are small and he subjectively feels constipated.  -colace 100 mg BID  -Miralax daily prn    FEN/GI: NPO, SLIV  Prophylaxis: SCDs    Disposition: Pending IR recommendations   Subjective:  Sleeping well this morning. Ate some breakfast. Still with abdominal pain.   Objective: Temp:  [98.1 F (36.7 C)-98.5 F (36.9 C)] 98.3 F (36.8 C) (03/29 0514) Pulse Rate:  [72-89] 79 (03/29 0514) Resp:  [12-21] 18 (03/29 0514) BP: (101-141)/(52-82) 119/56 mmHg (03/29 0514) SpO2:  [93 %-98 %] 96 % (03/29 0514) Weight:  [319 lb 6.4 oz (144.879 kg)-319 lb 9.6 oz (144.97 kg)] 319 lb 9.6 oz (144.97 kg) (03/29 0129) Physical Exam: General: obese male lying in bed, sleeping but arousable  Cardiovascular: RRR. No murmurs appreciated.  Respiratory: Lyons in place. Occasional crackle in R base. Normal WOB.  Abdomen: Distended and taut. Diffusely tender in upper quadrants L >R. +Guarding.  Extremities: 2+ pitting edema to knees bilaterally   Laboratory:  Recent Labs Lab 02/13/16 0410 02/16/16 1203 02/17/16 0158  WBC 6.0 5.5 4.9  HGB 8.6* 9.3* 8.9*  HCT 26.8* 28.3* 28.6*  PLT 69* 85* 82*    Recent Labs Lab 02/13/16 0410 02/16/16 1203 02/17/16 0158  NA 136 137 138  K 4.2 3.8 3.6  CL 100* 101 103  CO2 28 27 27   BUN 17 15 15   CREATININE 1.12 1.29* 1.30*  CALCIUM 7.9* 8.2* 8.0*  PROT 5.6* 6.5 6.0*  BILITOT 2.1* 2.5* 1.7*  ALKPHOS 88 102 89  ALT 77* 90* 80*  AST 99* 106* 91*  GLUCOSE 88 117* 110*   Magnesium  1.8 Phosphorus 4.1  BNP 18   Imaging/Diagnostic Tests: Ct Abdomen Pelvis W Contrast  02/16/2016 CLINICAL DATA: 60 year old male with abdominal pain and distention. EXAM: CT ABDOMEN AND PELVIS WITH CONTRAST TECHNIQUE: Multidetector CT imaging of the abdomen and pelvis was performed using the standard protocol following bolus administration of intravenous contrast. CONTRAST: 1 ISOVUE-300 IOPAMIDOL (ISOVUE-300) INJECTION 61% COMPARISON: CT dated 02/08/2016 FINDINGS: Trace right pleural effusion. No intra-abdominal free air. Small ascites similar prior study. Cirrhosis. Cholecystectomy. The pancreas appears unremarkable. Splenomegaly measuring 17 cm. The adrenal glands appear unremarkable. There is a 4 mm nonobstructing right renal inferior pole calculus. No hydronephrosis. The visualized ureters urinary bladder appear unremarkable. The prostate and seminal vesicles are grossly unremarkable. There is a 1.5 cm diverticulum arising from the medial wall of the second portion of the duodenum. There is no evidence of bowel obstruction. No definite active inflammatory changes of the bowel identified, however evaluation is limited due to ascites. The appendix appears unremarkable. Mild aortoiliac atherosclerotic disease. There is a 3.2 cm infrarenal abdominal aortic aneurysm similar prior study. No portal venous gas identified. Ill-defined vague hypodensity in the main portal vein is likely artifactual related mixing of the contrast. The thrombus is less likely. Stable upper abdominal and gastric varices status post  BRTO with sclerosant material material within the gastric versus similar the prior study. A dilated gastric varix segment along the lesser curvature of the stomach at the gastroesophageal junction remains patent. There is no adenopathy. There is mild diffuse stranding of the subcutaneous soft tissues of the abdominal wall. Mild digit changes of the spine. No acute fracture. IMPRESSION: Cirrhosis with  evidence of portal hypertension, splenomegaly, and small ascites similar prior study. Stable appearing post procedural changes of BRTO with sclerosant material in the gastric varices similar to prior study. A mildly dilated gastric varices along the lesser curvature of the stomach at the gastroesophageal junction remains patent. No evidence of bowel obstruction. Small nonobstructing right renal inferior pole calculus. Electronically Signed By: Anner Crete M.D. On: 02/16/2016 22:12   Nicolette Bang, DO 02/17/2016, 8:36 AM PGY-1, [place] Intern pager: [phone], text pages welcome

## 2016-02-17 NOTE — Progress Notes (Signed)
Patient arrived on unit via stretcher with RN. Patient alert and oriented x4. Patient to unit, staff and room. Patient placed on telemetry monitor, CCMD notifed. Skin assessment completed with Becca Q.,RN, no skin issues noted. Patient's IV clean, dry and intact. Patient denies pain. Safety Fall Prevention Plan was given, discussed and signed by patient. Orders have been reviewed and implemented. Will continue to monitor the patient. Call light has been placed within reach and bed alarm has been activated.   Nena Polio BSN, RN  Phone Number: [phone]

## 2016-02-17 NOTE — Progress Notes (Signed)
Referring Physician(s): Tre Marin Comment  Supervising Physician: Daryll Brod  Chief Complaint:  F/U after BRTO  Subjective:  Patient is doing well today.  He does not seem to have any encephalopathy today at all.  He reports + bowel movement this morning.  Only c/o abdominal distension and I performed paracentesis on him today.  Allergies: Review of patient's allergies indicates no known allergies.  Medications: Prior to Admission medications   Medication Sig Start Date End Date Taking? Authorizing Provider  aspirin 81 MG tablet Take 1 tablet (81 mg total) by mouth daily. 02/22/16  Yes Reyne Dumas, MD  buPROPion (WELLBUTRIN XL) 150 MG 24 hr tablet Take 150 mg by mouth daily.   Yes Historical Provider, MD  clopidogrel (PLAVIX) 75 MG tablet Take 1 tablet (75 mg total) by mouth daily. 02/22/16  Yes Reyne Dumas, MD  cyclobenzaprine (FLEXERIL) 10 MG tablet Take 1 tablet (10 mg total) by mouth 3 (three) times daily as needed for muscle spasms. 09/14/15  Yes Rosemarie Ax, MD  docusate sodium (COLACE) 100 MG capsule Take 1 capsule (100 mg total) by mouth 2 (two) times daily. 02/13/16  Yes Reyne Dumas, MD  furosemide (LASIX) 20 MG tablet Take 2 tablets (40 mg total) by mouth 2 (two) times daily. 02/13/16  Yes Reyne Dumas, MD  isosorbide mononitrate (IMDUR) 30 MG 24 hr tablet Take 1 tablet (30 mg total) by mouth daily. 02/03/16  Yes Rosemarie Ax, MD  metoprolol succinate (TOPROL-XL) 25 MG 24 hr tablet Take 0.5 tablets (12.5 mg total) by mouth daily. 02/26/15  Yes Sherren Mocha, MD  nitroGLYCERIN (NITROSTAT) 0.4 MG SL tablet Place 1 tablet (0.4 mg total) under the tongue every 5 (five) minutes as needed for chest pain (up to 3 doses). 10/31/14  Yes Dayna N Dunn, PA-C  pantoprazole (PROTONIX) 40 MG tablet Take 1 tablet (40 mg total) by mouth 2 (two) times daily. 02/13/16  Yes Reyne Dumas, MD  spironolactone (ALDACTONE) 25 MG tablet Take 1 tablet (25 mg total) by mouth daily. 02/13/16  Yes  Reyne Dumas, MD     Vital Signs: BP 119/62 mmHg  Pulse 81  Temp(Src) 98.6 F (37 C) (Oral)  Resp 20  Ht 6\' 4"  (1.93 m)  Wt 319 lb 9.6 oz (144.97 kg)  BMI 38.92 kg/m2  SpO2 98%  Physical Exam Awake and Alert Oriented  Abdomen round/distended = improved after para.  Imaging: Ct Abdomen Pelvis W Contrast  02/16/2016  CLINICAL DATA:  60 year old male with abdominal pain and distention. EXAM: CT ABDOMEN AND PELVIS WITH CONTRAST TECHNIQUE: Multidetector CT imaging of the abdomen and pelvis was performed using the standard protocol following bolus administration of intravenous contrast. CONTRAST:  1 ISOVUE-300 IOPAMIDOL (ISOVUE-300) INJECTION 61% COMPARISON:  CT dated 02/08/2016 FINDINGS: Trace right pleural effusion. No intra-abdominal free air.  Small ascites similar prior study. Cirrhosis. Cholecystectomy. The pancreas appears unremarkable. Splenomegaly measuring 17 cm. The adrenal glands appear unremarkable. There is a 4 mm nonobstructing right renal inferior pole calculus. No hydronephrosis. The visualized ureters urinary bladder appear unremarkable. The prostate and seminal vesicles are grossly unremarkable. There is a 1.5 cm diverticulum arising from the medial wall of the second portion of the duodenum. There is no evidence of bowel obstruction. No definite active inflammatory changes of the bowel identified, however evaluation is limited due to ascites. The appendix appears unremarkable. Mild aortoiliac atherosclerotic disease. There is a 3.2 cm infrarenal abdominal aortic aneurysm similar prior study. No portal venous gas identified. Ill-defined  vague hypodensity in the main portal vein is likely artifactual related mixing of the contrast. The thrombus is less likely. Stable upper abdominal and gastric varices status post BRTO with sclerosant material material within the gastric versus similar the prior study. A dilated gastric varix segment along the lesser curvature of the stomach at the  gastroesophageal junction remains patent. There is no adenopathy. There is mild diffuse stranding of the subcutaneous soft tissues of the abdominal wall. Mild digit changes of the spine. No acute fracture. IMPRESSION: Cirrhosis with evidence of portal hypertension, splenomegaly, and small ascites similar prior study. Stable appearing post procedural changes of BRTO with sclerosant material in the gastric varices similar to prior study. A mildly dilated gastric varices along the lesser curvature of the stomach at the gastroesophageal junction remains patent. No evidence of bowel obstruction. Small nonobstructing right renal inferior pole calculus. Electronically Signed   By: Anner Crete M.D.   On: 02/16/2016 22:12   Dg Chest Port 1 View  02/17/2016  CLINICAL DATA:  Acute onset of shortness of breath and dyspnea. Initial encounter. EXAM: PORTABLE CHEST 1 VIEW COMPARISON:  Chest radiograph performed 02/07/2016 FINDINGS: Lung expansion is mildly improved. Vascular congestion is noted. Patchy bibasilar airspace opacities may reflect mild pulmonary edema or possibly pneumonia. No pleural effusion or pneumothorax is seen. The cardiomediastinal silhouette is normal in size. No acute osseous abnormalities are seen. IMPRESSION: Vascular congestion noted. Patchy bibasilar airspace opacities may reflect mild pulmonary edema or possibly pneumonia. Electronically Signed   By: Garald Balding M.D.   On: 02/17/2016 00:38    Labs:  CBC:  Recent Labs  02/12/16 0857 02/13/16 0410 02/16/16 1203 02/17/16 0158  WBC 5.8 6.0 5.5 4.9  HGB 8.3* 8.6* 9.3* 8.9*  HCT 25.9* 26.8* 28.3* 28.6*  PLT 70* 69* 85* 82*    COAGS:  Recent Labs  02/05/16 0111 02/07/16 0704 02/10/16 1418 02/17/16 0158  INR 1.45 1.43 1.39 1.57*  APTT  --  30  --   --     BMP:  Recent Labs  02/11/16 0320 02/13/16 0410 02/16/16 1203 02/17/16 0158  NA 136 136 137 138  K 4.1 4.2 3.8 3.6  CL 102 100* 101 103  CO2 28 28 27 27     GLUCOSE 95 88 117* 110*  BUN 21* 17 15 15   CALCIUM 7.6* 7.9* 8.2* 8.0*  CREATININE 1.09 1.12 1.29* 1.30*  GFRNONAA >60 >60 59* 59*  GFRAA >60 >60 >60 >60    LIVER FUNCTION TESTS:  Recent Labs  02/11/16 0320 02/13/16 0410 02/16/16 1203 02/17/16 0158  BILITOT 1.3* 2.1* 2.5* 1.7*  AST 68* 99* 106* 91*  ALT 57 77* 90* 80*  ALKPHOS 70 88 102 89  PROT 5.2* 5.6* 6.5 6.0*  ALBUMIN 2.2* 2.5* 2.6* 2.4*    Assessment and Plan:  UGI bleed secondary to gastric varices, s/p BRTO by Watts on 3/16 with removal of balloon on 3/17.  There is a small non-occlusive thrombus in the portal vein.  Dr. Pascal Lux recommends low dose Lovenox (30 mg sub-cu daily).  He needs to be followed closely by GI with routine EGD and manage ascites (with diuretics + routine paracentesis as needed)  Electronically Signed: Murrell Redden PA-C 02/17/2016, 2:49 PM   I spent a total of 15 Minutes at the the patient's bedside AND on the patient's hospital floor or unit, greater than 50% of which was counseling/coordinating care for f/u after BRTO

## 2016-02-18 DIAGNOSIS — R0600 Dyspnea, unspecified: Secondary | ICD-10-CM

## 2016-02-18 DIAGNOSIS — I81 Portal vein thrombosis: Secondary | ICD-10-CM

## 2016-02-18 DIAGNOSIS — R1084 Generalized abdominal pain: Secondary | ICD-10-CM

## 2016-02-18 DIAGNOSIS — R188 Other ascites: Secondary | ICD-10-CM

## 2016-02-18 LAB — BASIC METABOLIC PANEL
ANION GAP: 7 (ref 5–15)
BUN: 12 mg/dL (ref 6–20)
CHLORIDE: 101 mmol/L (ref 101–111)
CO2: 27 mmol/L (ref 22–32)
Calcium: 7.7 mg/dL — ABNORMAL LOW (ref 8.9–10.3)
Creatinine, Ser: 1.12 mg/dL (ref 0.61–1.24)
GLUCOSE: 95 mg/dL (ref 65–99)
Potassium: 4.1 mmol/L (ref 3.5–5.1)
Sodium: 135 mmol/L (ref 135–145)

## 2016-02-18 LAB — CBC
HEMATOCRIT: 26.7 % — AB (ref 39.0–52.0)
HEMOGLOBIN: 8.3 g/dL — AB (ref 13.0–17.0)
MCH: 29.1 pg (ref 26.0–34.0)
MCHC: 31.1 g/dL (ref 30.0–36.0)
MCV: 93.7 fL (ref 78.0–100.0)
Platelets: 70 10*3/uL — ABNORMAL LOW (ref 150–400)
RBC: 2.85 MIL/uL — ABNORMAL LOW (ref 4.22–5.81)
RDW: 16.4 % — ABNORMAL HIGH (ref 11.5–15.5)
WBC: 4.2 10*3/uL (ref 4.0–10.5)

## 2016-02-18 LAB — PATHOLOGIST SMEAR REVIEW

## 2016-02-18 LAB — GLUCOSE, CAPILLARY: Glucose-Capillary: 101 mg/dL — ABNORMAL HIGH (ref 65–99)

## 2016-02-18 MED ORDER — SPIRONOLACTONE 50 MG PO TABS
200.0000 mg | ORAL_TABLET | Freq: Every day | ORAL | Status: DC
  Administered 2016-02-19: 200 mg via ORAL
  Filled 2016-02-18: qty 4

## 2016-02-18 MED ORDER — CYCLOBENZAPRINE HCL 5 MG PO TABS
5.0000 mg | ORAL_TABLET | Freq: Three times a day (TID) | ORAL | Status: DC | PRN
  Administered 2016-02-18 – 2016-02-19 (×2): 5 mg via ORAL
  Filled 2016-02-18 (×2): qty 1

## 2016-02-18 MED ORDER — PANTOPRAZOLE SODIUM 40 MG PO TBEC
40.0000 mg | DELAYED_RELEASE_TABLET | Freq: Two times a day (BID) | ORAL | Status: DC
  Administered 2016-02-18 – 2016-02-19 (×2): 40 mg via ORAL
  Filled 2016-02-18 (×2): qty 1

## 2016-02-18 MED ORDER — FUROSEMIDE 80 MG PO TABS
80.0000 mg | ORAL_TABLET | Freq: Two times a day (BID) | ORAL | Status: DC
  Administered 2016-02-18 – 2016-02-19 (×2): 80 mg via ORAL
  Filled 2016-02-18 (×2): qty 1

## 2016-02-18 MED ORDER — RIFAXIMIN 550 MG PO TABS
550.0000 mg | ORAL_TABLET | Freq: Two times a day (BID) | ORAL | Status: DC
  Administered 2016-02-18 – 2016-02-19 (×2): 550 mg via ORAL
  Filled 2016-02-18 (×2): qty 1

## 2016-02-18 MED ORDER — SPIRONOLACTONE 50 MG PO TABS
100.0000 mg | ORAL_TABLET | Freq: Once | ORAL | Status: AC
  Administered 2016-02-18: 100 mg via ORAL
  Filled 2016-02-18: qty 2

## 2016-02-18 NOTE — Progress Notes (Signed)
Family Medicine Teaching Service Daily Progress Note Intern Pager: [phone]  Patient name: Vincent Ochoa Medical record number: [id number] Date of birth: December 26, 1955 Age: 60 y.o. Gender: male  Primary Care Provider: Clearance Coots, MD Consultants: IR  Code Status: FULL   Pt Overview and Major Events to Date:  3/28: Patient admitted for worsening abdominal ascites and abdominal pain; Rocephin for SBP prophylaxis  3/29: paracentesis with 8.4L of clear yellow fluid   Assessment and Plan: Vincent Ochoa is a 60 y.o. male presenting with worsening abdominal distention. PMH is significant for cirrhosis, chronic ascites, gastric varices, chronic thrombocytopenia, CAD, paroxysmal Afib, chronic pain, OSA, and depression.   Abdominal Ascites s/p Paracentesis: In the setting of cirrhosis 2/2 to alcoholism. 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 Pascal Ochoa recommends low dose Lovenox 30mg  sub-q daily - will discuss with patient regarding risks and benefits  Muscle Cramping: Likely due to increased dose of Lasix. 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 Gastric / Esophageal varices. S/P BRTO Gastric varices as above. Thrombocytopenia, poor synthetic liver function. Ascites.  -  Continue treatment as above.  - Holding metoprolol >>> consider switching to propanolol  - was on aldactone, lasix at home.   Dyspnea, resolved: Resolved s/p paracentesis. Patient on O2 by [place] for comfort. Had previously had stable O2 saturation on RA. Some mild crackles on lung exam concerning for possible pulmonary edema. Echo 12/15 with G2DD, in the setting of LE edema, dyspnea, DG Chest here with some pulmonary edema, HF may be contributing however less likely given BNP of 18.  - Consider repeat Echo however, symptoms now resolved so will hold off   Recent H/o Upper GI Bleed from Gastric Varices: Patient recently discharged on 3/25. Had EGD that revealed large gastric varices but no active bleeding but felt to be at high risk for re-bleeding.Required transfusion with 5uPRBC. Patient subsequently had re-bleeding and BRTO was performed. Was also treated with PPI and octreotide gtts. HgB currently stable at 9.3. No signs of active bleeding at admission.  - holding ASA and plavix given recent bleed (patient instructed to resume 4/3 from previous hospital d/c)  - continue to monitor HgB  - Protonix PO 40mg  BID  AKI, improving: Mild. Cr 1.29 at admission (bl 0.7-1). 1.12 < 1.3 - monitor Cr  - Continue lasix for now.  - Careful not to precipitate hepatorenal syndrome.   Chronic Thrombocytopenia: Followed by Dr. Alvy Ochoa. Baseline platelet count 40-70k. Platelets 85 at admission.  -continue to monitor on CBC   CAD: Status post prior bare-metal stenting of the left circumflex in December 2015 with relook catheterization in March 2016 and again July 2016. He has stable anatomy with minimal in-stent restenosis within the left  circumflex. -continue home Imdur 40 mg daily  -holding home metoprolol   Paroxysmal AFib: Currently in NSR. Has not been on anticoagulation due to bleeding risk.   Chronic Pain: In back and knees. Occasionally takes Norco at home for pain relief. Patient  encephalopathic at previous hospital admission and given his severe liver disease, will be careful with narcotic use while he is hospitalized.   OSA: Good compliance with CPAP at home.  -CPAP ordered   Depression: Appropriate affect at admission. -continue home Wellbutrin XR 150 mg daily   Constipation: Having regular BMs but reports they are small and he subjectively feels constipated.  -colace 100 mg BID  -Miralax daily prn   FEN/GI: heart diet   Prophylaxis: SCDs   Disposition: Pending IR recommendations   Subjective:  Doing well. Abdominal pain has improved. Notes of some muscle cramping in his left thigh   Objective: Temp:  [98 F (36.7 C)-99.2 F (37.3 C)] 98.6 F (37 C) (03/30 0453) Pulse Rate:  [81-91] 91 (03/30 0453) Resp:  [18-20] 18 (03/30 0453) BP: (112-129)/(52-67) 112/52 mmHg (03/30 0453) SpO2:  [92 %-98 %] 97 % (03/30 0453) Physical Exam: General: obese male lying in bed, sleeping but arousable  Cardiovascular: RRR. No murmurs appreciated.  Respiratory: [place] in place. Occasional crackle in R base. Normal WOB.  Abdomen: Distended and taut. Diffusely tender in upper quadrants L >R. +Guarding.  Extremities: 2+ pitting edema to knees bilaterally   Laboratory:  Recent Labs Lab 02/16/16 1203 02/17/16 0158 02/18/16 0626  WBC 5.5 4.9 4.2  HGB 9.3* 8.9* 8.3*  HCT 28.3* 28.6* 26.7*  PLT 85* 82* 70*    Recent Labs Lab 02/13/16 0410 02/16/16 1203 02/17/16 0158 02/18/16 0626  NA 136 137 138 135  K 4.2 3.8 3.6 4.1  CL 100* 101 103 101  CO2 28 27 27 27   BUN 17 15 15 12   CREATININE 1.12 1.29* 1.30* 1.12  CALCIUM 7.9* 8.2* 8.0* 7.7*  PROT 5.6* 6.5 6.0*  --   BILITOT 2.1* 2.5* 1.7*  --   ALKPHOS 88 102 89  --   ALT 77* 90* 80*  --   AST 99* 106* 91*  --   GLUCOSE 88 117* 110* 95   Magnesium 1.8 Phosphorus 4.1  BNP 18   Paracentesis Fluid:  Results for Vincent, Ochoa (MRN [id number]) as of 02/18/2016 08:22  Ref. Range 02/17/2016 15:06  Color,  Fluid Latest Ref Range: YELLOW  STRAW (A)  WBC, Fluid Latest Ref Range: 0-1000 cu mm 168  Lymphs, Fluid Latest Units: % 28  Eos, Fluid Latest Units: % 1  Appearance, Fluid Latest Ref Range: CLEAR  HAZY (A)  Neutrophil Count, Fluid Latest Ref Range: 0-25 % 10  Monocyte-Macrophage-Serous Fluid Latest Ref Range: 50-90 % 61    Imaging/Diagnostic Tests: Ct Abdomen Pelvis W Contrast  02/16/2016 CLINICAL DATA: 60 year old male with abdominal pain and distention. EXAM: CT ABDOMEN AND PELVIS WITH CONTRAST TECHNIQUE: Multidetector CT imaging of the abdomen and pelvis was performed using the standard protocol following bolus administration of intravenous contrast. CONTRAST: 1 ISOVUE-300 IOPAMIDOL (ISOVUE-300) INJECTION 61% COMPARISON: CT dated 02/08/2016 FINDINGS: Trace right pleural effusion. No intra-abdominal free air. Small ascites similar prior study. Cirrhosis. Cholecystectomy. The pancreas appears unremarkable. Splenomegaly measuring 17 cm. The adrenal glands appear unremarkable. There is a 4 mm nonobstructing right renal inferior pole calculus. No hydronephrosis. The visualized ureters urinary bladder appear unremarkable. The prostate and seminal vesicles are grossly unremarkable. There is a 1.5 cm diverticulum arising  from the medial wall of the second portion of the duodenum. There is no evidence of bowel obstruction. No definite active inflammatory changes of the bowel identified, however evaluation is limited due to ascites. The appendix appears unremarkable. Mild aortoiliac atherosclerotic disease. There is a 3.2 cm infrarenal abdominal aortic aneurysm similar prior study. No portal venous gas identified. Ill-defined vague hypodensity in the main portal vein is likely artifactual related mixing of the contrast. The thrombus is less likely. Stable upper abdominal and gastric varices status post BRTO with sclerosant material material within the gastric versus similar the prior study. A dilated  gastric varix segment along the lesser curvature of the stomach at the gastroesophageal junction remains patent. There is no adenopathy. There is mild diffuse stranding of the subcutaneous soft tissues of the abdominal wall. Mild digit changes of the spine. No acute fracture. IMPRESSION: Cirrhosis with evidence of portal hypertension, splenomegaly, and small ascites similar prior study. Stable appearing post procedural changes of BRTO with sclerosant material in the gastric varices similar to prior study. A mildly dilated gastric varices along the lesser curvature of the stomach at the gastroesophageal junction remains patent. No evidence of bowel obstruction. Small nonobstructing right renal inferior pole calculus. Electronically Signed By: Anner Crete M.D. On: 02/16/2016 22:12   Smiley Houseman, MD 02/18/2016, 8:17 AM PGY-1, [place] Intern pager: [phone], text pages welcome

## 2016-02-18 NOTE — Consult Note (Signed)
Referring Provider: Dr. Ree Kida Primary Care Physician:  Clearance Coots, MD Primary Gastroenterologist:  Althia Forts  Reason for Consultation:  Cirrhosis; Portal Vein Thrombosis; Ascites  HPI: Vincent Ochoa is a 60 y.o. male with a history of decompensated alcoholic cirrhosis who had bleeding gastric varices about two weeks ago and EGD at [facility] large gastric varices without active bleeding. Patient was subsequently transferred to [facility] in order to have a BRTO (Balloon-Occluded Retrograde Transvenous Obliteration of Gastric Varices), which was done on 02/05/16. Patient admitted for worsening abdominal pain and ascites and s/p large volume paracentesis on 02/17/16 with 8.4 L removed. Partial non-occlusive thrombosis in portal vein seen by IR that was not on the CT from 02/08/16. Feels ok now stating that abdominal pain is very minimal right now. No melena or hematochezia.   Past Medical History  Diagnosis Date  . GERD (gastroesophageal reflux disease)   . Psoriasis   . Frequent headaches   . Facial cellulitis 2014    Periorbital  . Coronary artery disease     a. Evaluated by Dr. Stanford Breed 2012 - nonobstructive 2007-2008 by cath. b. Abnormal stress test 10/2014 but cath deferred temporarily due to thrombocytopenia. c. 10/2014: readm with AF-RVR/NSTEMI - s/p BMS to mLCx 11/04/14 (mod dz in RCA);  d. relook cath 01/2015 and 06/02/2015 patent stent, nonobs CAD, EF 55-65%.  . Back pain, lumbosacral 2014  . Thrombocytopenia ([place])     a. Onset unclear, but noted 2012. ? 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 s/p prednisone, IVIG.  [place] Kitchen Hyperlipidemia   . Obesity   . Elevated LFTs   . Fatty liver US - 2012  . PAF (paroxysmal atrial fibrillation) ([place])     a. Episode 10/2014 at time of NSTEMI, spont converted to NSR. Observation for further episodes (not on anticoag at present time due to [facility] = 1, thrombocytopenia).  . Sinus bradycardia   . OSA  (obstructive sleep apnea)     severe with AHI 56/hr with successful CPAP titration to 18cm H2O  . Morbid obesity [facility])     Past Surgical History  Procedure Laterality Date  . Tonsillectomy  1960's  . Cholecystectomy  2007  . Knee arthroplasty  1970's  . Facial cosmetic surgery  2014  . Cardiac catheterization    . Left heart catheterization with coronary angiogram N/A 11/04/2014    Procedure: LEFT HEART CATHETERIZATION WITH CORONARY ANGIOGRAM;  Surgeon: Jettie Booze, MD;  Location: [facility] CATH LAB;  Service: Cardiovascular;  Laterality: N/A;  . Left heart catheterization with coronary angiogram N/A 01/28/2015    Procedure: LEFT HEART CATHETERIZATION WITH CORONARY ANGIOGRAM;  Surgeon: Sherren Mocha, MD;  Location: [facility] CATH LAB;  Service: Cardiovascular;  Laterality: N/A;  . Cardiac catheterization N/A 06/02/2015    Procedure: Left Heart Cath and Coronary Angiography;  Surgeon: Jettie Booze, MD;  Location: [place] CV LAB;  Service: Cardiovascular;  Laterality: N/A;  . Radiology with anesthesia N/A 02/04/2016    Procedure: RADIOLOGY WITH ANESTHESIA;  Surgeon: Medication Radiologist, MD;  Location: [place];  Service: Radiology;  Laterality: N/A;    Prior to Admission medications   Medication Sig Start Date End Date Taking? Authorizing Provider  aspirin 81 MG tablet Take 1 tablet (81 mg total) by mouth daily. 02/22/16  Yes Reyne Dumas, MD  buPROPion (WELLBUTRIN XL) 150 MG 24 hr tablet Take 150 mg by mouth daily.   Yes Historical Provider, MD  clopidogrel (PLAVIX) 75 MG tablet Take  1 tablet (75 mg total) by mouth daily. 02/22/16  Yes Reyne Dumas, MD  cyclobenzaprine (FLEXERIL) 10 MG tablet Take 1 tablet (10 mg total) by mouth 3 (three) times daily as needed for muscle spasms. 09/14/15  Yes Rosemarie Ax, MD  docusate sodium (COLACE) 100 MG capsule Take 1 capsule (100 mg total) by mouth 2 (two) times daily. 02/13/16  Yes Reyne Dumas, MD  furosemide (LASIX) 20 MG tablet Take 2 tablets  (40 mg total) by mouth 2 (two) times daily. 02/13/16  Yes Reyne Dumas, MD  isosorbide mononitrate (IMDUR) 30 MG 24 hr tablet Take 1 tablet (30 mg total) by mouth daily. 02/03/16  Yes Rosemarie Ax, MD  metoprolol succinate (TOPROL-XL) 25 MG 24 hr tablet Take 0.5 tablets (12.5 mg total) by mouth daily. 02/26/15  Yes Sherren Mocha, MD  nitroGLYCERIN (NITROSTAT) 0.4 MG SL tablet Place 1 tablet (0.4 mg total) under the tongue every 5 (five) minutes as needed for chest pain (up to 3 doses). 10/31/14  Yes Dayna N Dunn, PA-C  pantoprazole (PROTONIX) 40 MG tablet Take 1 tablet (40 mg total) by mouth 2 (two) times daily. 02/13/16  Yes Reyne Dumas, MD  spironolactone (ALDACTONE) 25 MG tablet Take 1 tablet (25 mg total) by mouth daily. 02/13/16  Yes Reyne Dumas, MD    Scheduled Meds: . aspirin EC  81 mg Oral Daily  . buPROPion  150 mg Oral Daily  . docusate sodium  100 mg Oral BID  . furosemide  80 mg Oral BID  . isosorbide mononitrate  30 mg Oral Daily  . pantoprazole  40 mg Oral BID  . polyethylene glycol  17 g Oral BID  . sodium chloride flush  3 mL Intravenous Q12H  . [START ON 02/19/2016] spironolactone  200 mg Oral Daily   Continuous Infusions:  PRN Meds:.cyclobenzaprine, morphine injection, nitroGLYCERIN  Allergies as of 02/16/2016  . (No Known Allergies)    Family History  Problem Relation Age of Onset  . Arthritis Maternal Grandmother   . Heart disease Sister   . Diabetes Sister   . Cancer - Ovarian Sister   . Heart disease      Maternal/paternal grandparents  . Cirrhosis Mother   . Heart attack Mother   . Heart attack Maternal Aunt   . Hypertension Mother   . Hypertension Father   . Stroke Father     Social History   Social History  . Marital Status: Married    Spouse Name: N/A  . Number of Children: N/A  . Years of Education: N/A   Occupational History  . Not on file.   Social History Main Topics  . Smoking status: Former Smoker -- 0.20 packs/day for 43 years     Types: Cigarettes  . Smokeless tobacco: Never Used     Comment: 11/28/2012 "chew occasionally"  Quit for 2.5 months in early 2016  . Alcohol Use: 0.0 oz/week    0 Standard drinks or equivalent per week     Comment: 11/28/2012 "beer q 6 months or so"  . Drug Use: No  . Sexual Activity: No   Other Topics Concern  . Not on file   Social History Narrative   Lives in [place].    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     Review of Systems: All negative except as stated above in HPI.  Physical Exam: Vital  signs: Filed Vitals:   02/18/16 0453 02/18/16 1000  BP: 112/52 114/58  Pulse: 91 79  Temp: 98.6 F (37 C) 98 F (36.7 C)  Resp: 18 18   Last BM Date: 02/18/16 General:   Alert,  Well-developed, well-nourished, pleasant and cooperative in NAD, obese Head: atraumatic Eyes: anicteric ENT: oropharynx clear Neck: supple, nontender Lungs:  Clear throughout to auscultation.   No wheezes, crackles, or rhonchi. No acute distress. Heart:  Regular rate and rhythm; no murmurs, clicks, rubs,  or gallops. Abdomen: diffusely tender with guarding, distended, +BS  Rectal:  Deferred Ext: no edema  GI:  Lab Results:  Recent Labs  02/16/16 1203 02/17/16 0158 02/18/16 0626  WBC 5.5 4.9 4.2  HGB 9.3* 8.9* 8.3*  HCT 28.3* 28.6* 26.7*  PLT 85* 82* 70*   BMET  Recent Labs  02/16/16 1203 02/17/16 0158 02/18/16 0626  NA 137 138 135  K 3.8 3.6 4.1  CL 101 103 101  CO2 27 27 27   GLUCOSE 117* 110* 95  BUN 15 15 12   CREATININE 1.29* 1.30* 1.12  CALCIUM 8.2* 8.0* 7.7*   LFT  Recent Labs  02/17/16 0158  PROT 6.0*  ALBUMIN 2.4*  AST 91*  ALT 80*  ALKPHOS 89  BILITOT 1.7*   PT/INR  Recent Labs  02/17/16 0158  LABPROT 18.8*  INR 1.57*     Studies/Results: Ct Abdomen Pelvis W Contrast  02/16/2016  CLINICAL DATA:  60 year old male with abdominal pain and distention. EXAM: CT ABDOMEN AND  PELVIS WITH CONTRAST TECHNIQUE: Multidetector CT imaging of the abdomen and pelvis was performed using the standard protocol following bolus administration of intravenous contrast. CONTRAST:  1 ISOVUE-300 IOPAMIDOL (ISOVUE-300) INJECTION 61% COMPARISON:  CT dated 02/08/2016 FINDINGS: Trace right pleural effusion. No intra-abdominal free air.  Small ascites similar prior study. Cirrhosis. Cholecystectomy. The pancreas appears unremarkable. Splenomegaly measuring 17 cm. The adrenal glands appear unremarkable. There is a 4 mm nonobstructing right renal inferior pole calculus. No hydronephrosis. The visualized ureters urinary bladder appear unremarkable. The prostate and seminal vesicles are grossly unremarkable. There is a 1.5 cm diverticulum arising from the medial wall of the second portion of the duodenum. There is no evidence of bowel obstruction. No definite active inflammatory changes of the bowel identified, however evaluation is limited due to ascites. The appendix appears unremarkable. Mild aortoiliac atherosclerotic disease. There is a 3.2 cm infrarenal abdominal aortic aneurysm similar prior study. No portal venous gas identified. Ill-defined vague hypodensity in the main portal vein is likely artifactual related mixing of the contrast. The thrombus is less likely. Stable upper abdominal and gastric varices status post BRTO with sclerosant material material within the gastric versus similar the prior study. A dilated gastric varix segment along the lesser curvature of the stomach at the gastroesophageal junction remains patent. There is no adenopathy. There is mild diffuse stranding of the subcutaneous soft tissues of the abdominal wall. Mild digit changes of the spine. No acute fracture. IMPRESSION: Cirrhosis with evidence of portal hypertension, splenomegaly, and small ascites similar prior study. Stable appearing post procedural changes of BRTO with sclerosant material in the gastric varices similar to  prior study. A mildly dilated gastric varices along the lesser curvature of the stomach at the gastroesophageal junction remains patent. No evidence of bowel obstruction. Small nonobstructing right renal inferior pole calculus. Electronically Signed   By: Anner Crete M.D.   On: 02/16/2016 22:12   US Paracentesis  02/17/2016  INDICATION: Ascites secondary to cirrhosis. History of  BRTO. Request for diagnostic and therapeutic paracentesis. EXAM: ULTRASOUND GUIDED LEFT LOWER QUADRANT PARACENTESIS MEDICATIONS: 1% Lidocaine. COMPLICATIONS: None immediate. PROCEDURE: Informed written consent was obtained from the patient after a discussion of the risks, benefits and alternatives to treatment. A timeout was performed prior to the initiation of the procedure. Initial ultrasound scanning demonstrates a large amount of ascites within the right lower abdominal quadrant. The right lower abdomen was prepped and draped in the usual sterile fashion. 1% lidocaine with epinephrine was used for local anesthesia. Following this, a 6 Fr Safe-T-Centesis catheter was introduced. An ultrasound image was saved for documentation purposes. The paracentesis was performed. The catheter was removed and a dressing was applied. The patient tolerated the procedure well without immediate post procedural complication. FINDINGS: A total of approximately 8.4 liters of clear yellow fluid was removed. Samples were sent to the laboratory as requested by the clinical team. IMPRESSION: Successful ultrasound-guided paracentesis yielding 8.4 liters of peritoneal fluid. Read by:  Gareth Eagle, PA-C Electronically Signed   By: Jerilynn Mages.  Shick M.D.   On: 02/17/2016 14:50   Dg Chest Port 1 View  02/17/2016  CLINICAL DATA:  Acute onset of shortness of breath and dyspnea. Initial encounter. EXAM: PORTABLE CHEST 1 VIEW COMPARISON:  Chest radiograph performed 02/07/2016 FINDINGS: Lung expansion is mildly improved. Vascular congestion is noted. Patchy bibasilar  airspace opacities may reflect mild pulmonary edema or possibly pneumonia. No pleural effusion or pneumothorax is seen. The cardiomediastinal silhouette is normal in size. No acute osseous abnormalities are seen. IMPRESSION: Vascular congestion noted. Patchy bibasilar airspace opacities may reflect mild pulmonary edema or possibly pneumonia. Electronically Signed   By: Garald Balding M.D.   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 MELD 17 on admit. Treatment of this partial PVT is complicated by the recent gastric variceal bleed and although he had BRTO done he is less than 3 weeks out from the procedure. I think the risks of anticoagulation outweigh the benefits for this patient due to his increased risk of further gastric variceal bleeding. Aspirin ok. Would start Rifaximin. Supportive care. Will follow.   LOS: 1 day   [place].  02/18/2016, 5:04 PM  Pager [phone]  If no answer or after 5 PM call [phone]

## 2016-02-19 ENCOUNTER — Telehealth: Payer: Self-pay | Admitting: Surgery

## 2016-02-19 ENCOUNTER — Inpatient Hospital Stay: Payer: Self-pay | Admitting: Family Medicine

## 2016-02-19 LAB — COMPREHENSIVE METABOLIC PANEL
ALBUMIN: 2.1 g/dL — AB (ref 3.5–5.0)
ALT: 63 U/L (ref 17–63)
AST: 64 U/L — AB (ref 15–41)
Alkaline Phosphatase: 88 U/L (ref 38–126)
Anion gap: 6 (ref 5–15)
BILIRUBIN TOTAL: 1 mg/dL (ref 0.3–1.2)
BUN: 12 mg/dL (ref 6–20)
CHLORIDE: 101 mmol/L (ref 101–111)
CO2: 29 mmol/L (ref 22–32)
Calcium: 7.7 mg/dL — ABNORMAL LOW (ref 8.9–10.3)
Creatinine, Ser: 1.24 mg/dL (ref 0.61–1.24)
GFR calc Af Amer: >60 mL/min (ref >60)
GFR calc non Af Amer: >60 mL/min (ref >60)
GLUCOSE: 97 mg/dL (ref 65–99)
POTASSIUM: 3.9 mmol/L (ref 3.5–5.1)
SODIUM: 136 mmol/L (ref 135–145)
Total Protein: 5.7 g/dL — ABNORMAL LOW (ref 6.5–8.1)

## 2016-02-19 LAB — CBC
HEMATOCRIT: 26.2 % — AB (ref 39.0–52.0)
HEMOGLOBIN: 8.5 g/dL — AB (ref 13.0–17.0)
MCH: 30.2 pg (ref 26.0–34.0)
MCHC: 32.4 g/dL (ref 30.0–36.0)
MCV: 93.2 fL (ref 78.0–100.0)
Platelets: 68 10*3/uL — ABNORMAL LOW (ref 150–400)
RBC: 2.81 MIL/uL — ABNORMAL LOW (ref 4.22–5.81)
RDW: 16.4 % — AB (ref 11.5–15.5)
WBC: 3.6 10*3/uL — ABNORMAL LOW (ref 4.0–10.5)

## 2016-02-19 LAB — GLUCOSE, CAPILLARY: Glucose-Capillary: 104 mg/dL — ABNORMAL HIGH (ref 65–99)

## 2016-02-19 MED ORDER — PANTOPRAZOLE SODIUM 40 MG PO TBEC: 40.0000 mg | DELAYED_RELEASE_TABLET | Freq: Two times a day (BID) | ORAL | Status: DC

## 2016-02-19 MED ORDER — SPIRONOLACTONE 100 MG PO TABS: 200.0000 mg | ORAL_TABLET | Freq: Every day | ORAL | Status: DC

## 2016-02-19 MED ORDER — CYCLOBENZAPRINE HCL 10 MG PO TABS: 10.0000 mg | ORAL_TABLET | Freq: Three times a day (TID) | ORAL | Status: DC | PRN

## 2016-02-19 MED ORDER — ASPIRIN 81 MG PO TABS: 81.0000 mg | ORAL_TABLET | Freq: Every day | ORAL | Status: DC

## 2016-02-19 MED ORDER — BUPROPION HCL ER (XL) 150 MG PO TB24: 150.0000 mg | ORAL_TABLET | Freq: Every day | ORAL | Status: DC

## 2016-02-19 MED ORDER — FUROSEMIDE 80 MG PO TABS: 80.0000 mg | ORAL_TABLET | Freq: Two times a day (BID) | ORAL | Status: DC

## 2016-02-19 MED ORDER — RIFAXIMIN 550 MG PO TABS: 550.0000 mg | ORAL_TABLET | Freq: Two times a day (BID) | ORAL | Status: DC

## 2016-02-19 MED ORDER — ISOSORBIDE MONONITRATE ER 30 MG PO TB24: 30.0000 mg | ORAL_TABLET | Freq: Every day | ORAL | Status: DC

## 2016-02-19 NOTE — Discharge Summary (Signed)
[facility] Discharge Summary  Patient name: Vincent Ochoa Medical record number: [id number] Date of birth: 1956/07/01 Age: 59 y.o. 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 In the ED, he received Zofran, IV Protonix, Morphine 4 mg IV , IV lasix 40 mg, and Rocephin. CT scan of abdomen was performed which revealed cirrhosis with evidence of portal hypertension, splenomegaly, and an ill-defined vague hypodensity in the main portal vein.  He was admitted for further management and started on ceftriaxone due to concerns for SBP.   IR was consulted to perform the paracentesis and asked to evaluate the CT of the abdomen, and they felt that the hypodensity was an small non-obstructing thrombus in the main portal vein. He underwent paracentesis on 3/29 with 8.4L out that was not consistent with an infection therefore ceftriaxone was discontinued. During this hospitalization, both his Lasix and spironolactone were increased. GI was consulted who recommended adding rifaximin and they noted that the risks of anticoagulation outweighed the benefits given his significant bleed history. On the day of discharge, he was  tolerating his new regimen. His abdomen was still distended, but small and less taut. His breathing had improved after the paracentesis and was stable on discharge.   Issues for Follow Up:  1. The patient's metoprolol was held on admission due to soft BPs. We have increased Lasix and aldactone with stable BPs here, continue to monitor.  Consider starting him on propanol given his cirrhosis vs re-starting metoprolol for his CAD. 2. Speaking with GI, they noted that they would only recommend ASA given his significant bleeding history and were uncomfortable with re-starting Plavix. Consider touching base with cardiology to determine the risk/benefits of re-starting this medication.  Significant Procedures: IR- paracentesis (3/29)  Significant Labs and Imaging:   Recent Labs Lab 02/17/16 0158 02/18/16 0626 02/19/16 0545  WBC 4.9 4.2 3.6*  HGB 8.9* 8.3* 8.5*  HCT 28.6* 26.7* 26.2*  PLT 82* 70* 68*    Recent Labs Lab 02/13/16 0410 02/16/16 1203 02/17/16 0158 02/18/16 0626 02/19/16 0545  NA 136 137 138 135 136  K 4.2 3.8 3.6 4.1 3.9  CL 100* 101 103 101 101  CO2 28 27 27 27 29   GLUCOSE 88 117* 110* 95 97  BUN 17 15 15 12 12   CREATININE 1.12 1.29* 1.30* 1.12 1.24  CALCIUM 7.9* 8.2* 8.0* 7.7* 7.7*  MG  --   --  1.8  --   --   PHOS  --   --  4.1  --   --   ALKPHOS 88 102 89  --  88  AST 99* 106* 91*  --  64*  ALT 77* 90* 80*  --  63  ALBUMIN 2.5* 2.6* 2.4*  --  2.1*  Paracentesis Fluid:  Results for JAYMERE, VANBLARICOM (MRN [id number]) as of 02/18/2016 08:22  Ref. Range 02/17/2016 15:06  Color, Fluid Latest Ref Range: YELLOW  STRAW (A)  WBC, Fluid Latest Ref Range: 0-1000 cu mm 168  Lymphs, Fluid Latest Units: % 28  Eos, Fluid Latest Units: % 1  Appearance, Fluid Latest Ref Range: CLEAR  HAZY (A)  Neutrophil Count, Fluid Latest Ref Range: 0-25 % 10  Monocyte-Macrophage-Serous Fluid Latest Ref Range: 50-90 % 61   Gram stain: Few WBCs, no organisms      Peritoneal fluid culture: NGTD x 2d.  Results/Tests Pending at Time of Discharge: peritoneal fluid culture final result   Discharge Medications:    Medication List    STOP taking these medications        clopidogrel 75 MG tablet  Commonly known as:  PLAVIX     metoprolol succinate 25 MG 24 hr tablet  Commonly known as:  TOPROL-XL      TAKE these medications        aspirin 81 MG tablet  Take 1 tablet (81 mg total) by mouth daily.  Start taking on:  02/22/2016     buPROPion 150 MG 24 hr tablet  Commonly known as:  WELLBUTRIN XL  Take 150 mg by mouth daily.     buPROPion 150 MG 24 hr tablet  Commonly known as:  WELLBUTRIN XL  Take 1 tablet (150 mg total) by mouth daily.     cyclobenzaprine 10 MG tablet  Commonly known as:  FLEXERIL  Take 1 tablet (10 mg total) by mouth 3 (three) times daily as needed for muscle spasms.     docusate sodium 100 MG capsule  Commonly known as:  COLACE  Take 1 capsule (100 mg total) by mouth 2 (two) times daily.     furosemide 80 MG tablet  Commonly known as:  LASIX  Take 1 tablet (80 mg total) by mouth 2 (two) times daily.     isosorbide mononitrate 30 MG 24 hr tablet  Commonly known as:  IMDUR  Take 1 tablet (30 mg total) by mouth daily.     nitroGLYCERIN 0.4 MG SL tablet  Commonly known as:  NITROSTAT  Place 1 tablet (0.4 mg total) under the tongue every 5 (five) minutes as needed for chest pain (up to 3 doses).     pantoprazole 40 MG tablet  Commonly known as:  PROTONIX  Take 1 tablet (40 mg total) by mouth 2 (two) times daily.     rifaximin 550 MG Tabs tablet  Commonly known as:  XIFAXAN  Take 1 tablet (550 mg total) by mouth 2 (two) times daily.     spironolactone 100 MG tablet  Commonly known as:  ALDACTONE  Take 2 tablets (200 mg total) by mouth daily.        Discharge Instructions: Please refer to Patient Instructions section of EMR for full details.  Patient was counseled important signs and symptoms that should  prompt return to medical care, changes in medications, dietary instructions, activity restrictions, and follow up appointments.   Follow-Up Appointments: Follow-up Information    Follow up with Clearance Coots, MD On 02/23/2016.   Specialty:  Family Medicine   Why:  at 3pm for a hospital follow up   Contact information:   [place] [address] [phone]       Follow up with [facility].   Why:  will call patient on Monday for a follow appt (call  on Tuesday if they do not contact you)   Contact information:   [address] [place] [address] [phone]       Archie Patten, MD 02/19/2016, 3:28 PM PGY-2, [place]

## 2016-02-19 NOTE — Telephone Encounter (Signed)
ED CM received call from [facility] on [address] regarding patient's wife calling the unit. She reports having issue with Council letter, this CM re-enrolled patient in PDMI enrollment successful, wife made aware to have pharmacy reprocess MATCH medications assistance letter. Teach back done, wife verbalizes understanding. No further questions or concerns, provided my contact information should any additional questions or concerns should arise.

## 2016-02-19 NOTE — Discharge Instructions (Signed)
Follow up with Dr. Raeford Razor on Tuesday. The gastroenterologist will call you on Monday to schedule an appointment, if you do not hear from them, please call them. We have increased your Lasix to 80mg  twice daily  We have increased your spironolactone to 200mg  daily We have added a medication called rifaximin which you should take twice daily to help prevent confusion associated with liver dysfunction.  Continue to take aspirin, continue to HOLD Plavix. Please discuss possibly restarting Plavix with your PCP or cardiologist.   If you start vomiting blood, have increased SOB, or have chest pain please return to the ER immediately.  Ascites Ascites is a collection of excess fluid in the abdomen. Ascites can range from mild to severe. It can get worse without treatment. CAUSES Possible causes include:  Cirrhosis. This is the most common cause of ascites.  Infection or inflammation in the abdomen.  Cancer in the abdomen.  Heart failure.  Kidney disease.  Inflammation of the pancreas.  Clots in the veins of the liver. SIGNS AND SYMPTOMS Signs and symptoms may include:  A feeling of fullness in your abdomen. This is common.  An increase in the size of your abdomen or your waist.  Swelling in your legs.  Swelling of the scrotum in men.  Difficulty breathing.  Abdominal pain.  Sudden weight gain. If the condition is mild, you may not have symptoms. DIAGNOSIS To make a diagnosis, your health care provider will:  Ask about your medical history.  Perform a physical exam.  Order imaging tests, such as an ultrasound or CT scan of your abdomen. TREATMENT Treatment depends on the cause of the ascites. It may include:  Taking a pill to make you urinate. This is called a water pill (diuretic pill).  Strictly reducing your salt (sodium) intake. Salt can cause extra fluid to be kept in the body, and this makes ascites worse.  Having a procedure to remove fluid from your abdomen  (paracentesis).  Having a procedure to transfer fluid from your abdomen into a vein.  Having a procedure that connects two of the major veins within your liver and relieves pressure on your liver (TIPS procedure). Ascites may go away or improve with treatment of the condition that caused it.  HOME CARE INSTRUCTIONS  Keep track of your weight. To do this, weigh yourself at the same time every day and record your weight.  Keep track of how much you drink and any changes in the amount you urinate.  Follow any instructions that your health care provider gives you about how much to drink.  Try not to eat salty (high-sodium) foods.  Take medicines only as directed by your health care provider.  Keep all follow-up visits as directed by your health care provider. This is important.  Report any changes in your health to your health care provider, especially if you develop new symptoms or your symptoms get worse. SEEK MEDICAL CARE IF:  Your gain more than 3 pounds in 3 days.  Your abdominal size or your waist size increases.  You have new swelling in your legs.  The swelling in your legs gets worse. SEEK IMMEDIATE MEDICAL CARE IF:  You develop a fever.  You develop confusion.  You develop new or worsening difficulty breathing.  You develop new or worsening abdominal pain.  You develop new or worsening swelling in the scrotum (in men).   This information is not intended to replace advice given to you by your health care provider. Make  sure you discuss any questions you have with your health care provider.   Document Released: 11/07/2005 Document Revised: 11/28/2014 Document Reviewed: 06/06/2014 Elsevier Interactive Patient Education [employer].

## 2016-02-19 NOTE — Progress Notes (Signed)
Family Medicine Teaching Service Daily Progress Note Intern Pager: [phone]  Patient name: Vincent Ochoa Medical record number: [id number] Date of birth: 17-Apr-1956 Age: 60 y.o. Gender: male  Primary Care Provider: Clearance Coots, MD Consultants: IR  Code Status: FULL   Pt Overview and Major Events to Date:  3/28: Patient admitted for worsening abdominal ascites and abdominal pain; Rocephin for SBP prophylaxis  3/29: paracentesis with 8.4L of clear yellow fluid 3/30: gram stain negative, discontinued ceftriaxone  Assessment and Plan: Vincent Ochoa is a 60 y.o. male presenting with worsening abdominal distention. PMH is significant for cirrhosis, chronic ascites, gastric varices, chronic thrombocytopenia, CAD, paroxysmal Afib, chronic pain, OSA, and depression.   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 - Fluid Specimen: 168 WBC, Neut 10; gram stain with few wbc pred mononuclear; culture NGTD x <24hr -  Lasix PO 80 BID - spironolactone to 200 mg daily - added Rifaximin per GI recommendations - Discontinued Rocephin 3/30 as fluid not consistent with SBP - consulted IR, appreciate recs>> confirmed that he has a partial non-occlusive thrombosis in the portal vein not present on CT of abdomen 3/20 would recc close f/u by GI with routine EGD and manage ascites (with diuretics and PRN paracentesis) - GI consulted: note risk outweigh benefits of anticoagulation in this patient, however ok with ASA. Also added rifaximin.  Small Non-occlusive thrombus in Portal Vein:  - ASA currently, risk outweigh benefits for anticoagulation in the setting of recent variceal bleed.  Muscle Cramping: Likely due to increased dose of Lasix. 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 Gastric / Esophageal varices. S/P BRTO Gastric varices  as above. Thrombocytopenia, poor synthetic liver function. Ascites.  - Continue treatment as above.  - Holding metoprolol >>> consider switching to propanolol  - was on aldactone, lasix at home.   Dyspnea, resolved: Resolved s/p paracentesis.  Echo 12/15 with G2DD.  Recent H/o Upper GI Bleed from Gastric Varices: Patient recently discharged on 3/25. Had EGD that revealed large gastric varices but no active bleeding but felt to be at high risk for re-bleeding.Required transfusion with 5uPRBC. Patient subsequently had re-bleeding and BRTO was performed. Was also treated with PPI and octreotide gtts. HgB currently stable at 9.3. No signs of active bleeding at admission.  - on ASA/ consider restarting plavix on 4/3 as indicated from previous hospital d/c after bleed.  - continue to monitor HgB  - Protonix PO 40mg  BID  AKI, improving: Mild. Cr 1.29 at admission (bl 0.7-1). 1.12 >1.24 - monitor Cr  - Continue lasix for now.  - Careful not to precipitate hepatorenal syndrome.   Chronic Thrombocytopenia: Followed by Dr. Alvy Bimler. Baseline platelet count 40-70k. Platelets 85 at admission.  -continue to monitor on CBC   CAD: Status post prior bare-metal stenting of the left circumflex in December 2015 with relook catheterization in March 2016 and again July 2016. He has stable anatomy with minimal in-stent restenosis within the left circumflex. -continue home Imdur 40 mg daily  -holding home metoprolol  - holding home plavix for now.   Paroxysmal AFib: Currently in NSR. Has not been on anticoagulation due to bleeding risk.   Chronic Pain: In back and knees. Occasionally takes Norco at home for pain relief. Patient encephalopathic at previous hospital admission and given his severe liver disease, will be  careful with narcotic use while he is hospitalized.   OSA: Good compliance with CPAP at home.  -CPAP ordered   Depression: Appropriate affect at admission. -continue home Wellbutrin  XR 150 mg daily   Constipation, resolved: Having regular BMs but repconstipated.  -colace 100 mg BID  -Miralax daily prn   FEN/GI: heart diet   Prophylaxis: SCDs   Disposition: d/c today pending GI recs  Subjective:  Doing well. Abdominal pain has improved, having normal BMS. Notes on dizziness or orthostatic symptoms with increased dose of spironolactone. Notes continued UOP.   Objective: Temp:  [98 F (36.7 C)-98.8 F (37.1 C)] 98.4 F (36.9 C) (03/31 0752) Pulse Rate:  [67-89] 88 (03/31 0752) Resp:  [18-20] 18 (03/31 0752) BP: (100-128)/(40-67) 108/60 mmHg (03/31 0752) SpO2:  [91 %-98 %] 93 % (03/31 0752) Physical Exam: General: obese male sitting up in bed eating, in NAD Cardiovascular: RRR. No murmurs appreciated.  Respiratory: CTAB without wheezing, rhonchi, or crackles. Normal WOB. On RA.   Abdomen: +BS. Distended, not as firm. No tenderness.  Back: tenderness to palpation over the left paraspinal muscles, notes "spasm" feeling. Otherwise normal without bruising, erythema, or tenderness.  Extremities: 1+ pitting edema bilaterally.   Laboratory:  Recent Labs Lab 02/17/16 0158 02/18/16 0626 02/19/16 0545  WBC 4.9 4.2 3.6*  HGB 8.9* 8.3* 8.5*  HCT 28.6* 26.7* 26.2*  PLT 82* 70* 68*    Recent Labs Lab 02/16/16 1203 02/17/16 0158 02/18/16 0626 02/19/16 0545  NA 137 138 135 136  K 3.8 3.6 4.1 3.9  CL 101 103 101 101  CO2 27 27 27 29   BUN 15 15 12 12   CREATININE 1.29* 1.30* 1.12 1.24  CALCIUM 8.2* 8.0* 7.7* 7.7*  PROT 6.5 6.0*  --  5.7*  BILITOT 2.5* 1.7*  --  1.0  ALKPHOS 102 89  --  88  ALT 90* 80*  --  63  AST 106* 91*  --  64*  GLUCOSE 117* 110* 95 97   Magnesium 1.8 Phosphorus 4.1  BNP 18   Paracentesis Fluid:  Results for [name], [name] (MRN [id number]) as of 02/18/2016 08:22  Ref. Range 02/17/2016 15:06  Color, Fluid Latest Ref Range: YELLOW  STRAW (A)  WBC, Fluid Latest Ref Range: 0-1000 cu mm 168  Lymphs, Fluid Latest Units: % 28   Eos, Fluid Latest Units: % 1  Appearance, Fluid Latest Ref Range: CLEAR  HAZY (A)  Neutrophil Count, Fluid Latest Ref Range: 0-25 % 10  Monocyte-Macrophage-Serous Fluid Latest Ref Range: 50-90 % 61   Gram stain: Few WBCs, no organisms  Imaging/Diagnostic Tests: Ct Abdomen Pelvis W Contrast  02/16/2016 CLINICAL DATA: 60 year old male with abdominal pain and distention. EXAM: CT ABDOMEN AND PELVIS WITH CONTRAST TECHNIQUE: Multidetector CT imaging of the abdomen and pelvis was performed using the standard protocol following bolus administration of intravenous contrast. CONTRAST: 1 ISOVUE-300 IOPAMIDOL (ISOVUE-300) INJECTION 61% COMPARISON: CT dated 02/08/2016 FINDINGS: Trace right pleural effusion. No intra-abdominal free air. Small ascites similar prior study. Cirrhosis. Cholecystectomy. The pancreas appears unremarkable. Splenomegaly measuring 17 cm. The adrenal glands appear unremarkable. There is a 4 mm nonobstructing right renal inferior pole calculus. No hydronephrosis. The visualized ureters urinary bladder appear unremarkable. The prostate and seminal vesicles are grossly unremarkable. There is a 1.5 cm diverticulum arising from the medial wall of the second portion of the duodenum. There is no evidence of bowel obstruction. No definite active inflammatory changes of the bowel identified, however evaluation is limited due to ascites.  The appendix appears unremarkable. Mild aortoiliac atherosclerotic disease. There is a 3.2 cm infrarenal abdominal aortic aneurysm similar prior study. No portal venous gas identified. Ill-defined vague hypodensity in the main portal vein is likely artifactual related mixing of the contrast. The thrombus is less likely. Stable upper abdominal and gastric varices status post BRTO with sclerosant material material within the gastric versus similar the prior study. A dilated gastric varix segment along the lesser curvature of the stomach at the gastroesophageal  junction remains patent. There is no adenopathy. There is mild diffuse stranding of the subcutaneous soft tissues of the abdominal wall. Mild digit changes of the spine. No acute fracture. IMPRESSION: Cirrhosis with evidence of portal hypertension, splenomegaly, and small ascites similar prior study. Stable appearing post procedural changes of BRTO with sclerosant material in the gastric varices similar to prior study. A mildly dilated gastric varices along the lesser curvature of the stomach at the gastroesophageal junction remains patent. No evidence of bowel obstruction. Small nonobstructing right renal inferior pole calculus. Electronically Signed By: Anner Crete M.D. On: 02/16/2016 22:12   Archie Patten, MD 02/19/2016, 8:26 AM PGY-2, [place] Intern pager: [phone], text pages welcome

## 2016-02-20 ENCOUNTER — Telehealth: Payer: Self-pay | Admitting: *Deleted

## 2016-02-20 NOTE — Telephone Encounter (Signed)
CM spent over 2 hours investigating substitutes for Xifaxin which was prescribed for this 60 y.o. seen in [facility] for Ascites and Cirrhosis.  Made the pharmacist aware that there is assistance through manufacturer availabe but it is primarily for those that are insured.  Jeanelle Malling, MD the [facility] Medicine Resident on call, who confirmed that the medication could not be substituted. Spoke with AD of Care Management, Deveron Furlong who denied Override as this is NOT a mandatory medication and costs $2000.00. Made Legrand Como, Pharmacist at [facility] aware of above. No further CM needs at this time.

## 2016-02-22 LAB — CULTURE, BODY FLUID-BOTTLE

## 2016-02-22 LAB — CULTURE, BODY FLUID W GRAM STAIN -BOTTLE: Culture: NO GROWTH

## 2016-02-23 ENCOUNTER — Ambulatory Visit (INDEPENDENT_AMBULATORY_CARE_PROVIDER_SITE_OTHER): Payer: Medicaid Other | Admitting: Family Medicine

## 2016-02-23 ENCOUNTER — Encounter: Payer: Self-pay | Admitting: Family Medicine

## 2016-02-23 VITALS — BP 117/57 | HR 87 | Temp 98.4°F | Ht 75.0 in | Wt 296.6 lb

## 2016-02-23 DIAGNOSIS — I8501 Esophageal varices with bleeding: Secondary | ICD-10-CM

## 2016-02-23 DIAGNOSIS — K7460 Unspecified cirrhosis of liver: Secondary | ICD-10-CM

## 2016-02-23 DIAGNOSIS — R188 Other ascites: Principal | ICD-10-CM

## 2016-02-23 DIAGNOSIS — I25119 Atherosclerotic heart disease of native coronary artery with unspecified angina pectoris: Secondary | ICD-10-CM | POA: Diagnosis not present

## 2016-02-23 MED ORDER — LACTULOSE 20 GM/30ML PO SOLN: 45.0000 mL | Freq: Two times a day (BID) | ORAL | Status: DC

## 2016-02-23 NOTE — Patient Instructions (Signed)
Thank you for coming in,   We will have a paracentecis to try to get more volume off of your belly.   I have have added lactulose which you can take if you cannot afford the rifaximin.   If you have severe shortness of breath then please seek care in the ED.   You can restart your aspirin today.   Please bring all of your medications with you to each visit.   Sign up for My Chart to have easy access to your labs results, and communication with your Primary care physician   Please feel free to call with any questions or concerns at any time, at [phone]. --Dr. Raeford Razor

## 2016-02-25 DIAGNOSIS — R188 Other ascites: Secondary | ICD-10-CM

## 2016-02-25 DIAGNOSIS — K7460 Unspecified cirrhosis of liver: Secondary | ICD-10-CM | POA: Insufficient documentation

## 2016-02-25 NOTE — Assessment & Plan Note (Signed)
Advised he could restart ASA today but hold plavix.

## 2016-02-25 NOTE — Progress Notes (Signed)
   Subjective:    Vincent Ochoa - 60 y.o. male MRN [id number]  Date of birth: 05-13-1956  CC cirrhosis With ascites  HPI  Vincent Ochoa is here for cirrhosis with ascites.   He has had 2 admissions to the hospital recently. He has child Pugh C cirrhosis secondary to either alcoholism versus cirrhosis He has had 6 L and 8 L removed through paracentesis  He has been sent home on Lasix and spiral lactone. 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 His metoprolol was also stopped due to his lower blood pressures.  He reports today that the rifaximin cost $2500 and he would not be able to afford that on a regular basis. He has gotten on the [employer] and they do have a discount application process  He continues to have shortness of breath when he lies flat and has to use his sleep apnea on a regular occasion. He is moving throughout his house and exercises by walking to his mailbox and back. He feels distended but not as bad as he was on March 28. He has been taking his medications. Has been tolerating his new diet. Still having increased back pain, fatigue, and shortness of breath. He has a feeling of needing a bowel movement but can't quite express it. Currently doesn't have any edema.   Health Maintenance:  Health Maintenance Due  Topic Date Due  . COLONOSCOPY  02/27/2006    Review of Systems See HPI     Objective:   Physical Exam BP 117/57 mmHg  Pulse 87  Temp(Src) 98.4 F (36.9 C) (Oral)  Ht 6\' 3"  (1.905 m)  Wt 296 lb 9.6 oz (134.537 kg)  BMI 37.07 kg/m2 Gen: NAD, alert, cooperative with exam,  CV: RRR, good S1/S2, no murmur, no edema,  Resp: CTABL, no wheezes, non-labored Abd: distended but not painful, +BS, unable to assess organomegaly  Skin:  no rashes, normal turgor  Neuro: no gross deficits.     Assessment & Plan:   CAD (coronary artery disease) Metoprolol was stopped on last admission - His blood pressures remain low or so may try to add back beta blocker when I see him again - advised he could start ASA but continue to hold plavix.  - he has follow up with Dr. Burt Knack next month.   Cirrhosis of liver with ascites ([place]) He doesn't report a long history of alcoholism.  Unsure if his cirrhosis is related to alcohol or his psoriasis vs NASH.  Still having distention which is most likely the cause of his increased back pain and shortness of breath.  Having no abdominal pain today  Is adherent to his new diet  His abdomen is distended today  - rifaximin is expensive so will start lactulose today  - US paracentesis scheduled for May 10. Given Ranges for amount of fluid to be taken off with restrictions associated with blood pressure - he has follow up with Howie Ill on April 18 - has follow up with IR on April 20  - discussed with Dr. Erin Hearing.    Esophageal varices with bleeding ([place]) Advised he could restart ASA today but hold plavix.

## 2016-02-25 NOTE — Assessment & Plan Note (Addendum)
Metoprolol was stopped on last admission - His blood pressures remain low or so may try to add back beta blocker when I see him again - advised he could start ASA but continue to hold plavix.  - he has follow up with Dr. Burt Knack next month.

## 2016-02-25 NOTE — Assessment & Plan Note (Addendum)
He doesn't report a long history of alcoholism.  Unsure if his cirrhosis is related to alcohol or his psoriasis vs NASH.  Still having distention which is most likely the cause of his increased back pain and shortness of breath.  Having no abdominal pain today  Is adherent to his new diet  His abdomen is distended today  - rifaximin is expensive so will start lactulose today  - US paracentesis scheduled for May 10. Given Ranges for amount of fluid to be taken off with restrictions associated with blood pressure - he has follow up with Howie Ill on April 18 - has follow up with IR on April 20  - discussed with Dr. Erin Hearing.

## 2016-02-29 ENCOUNTER — Ambulatory Visit (HOSPITAL_COMMUNITY)
Admission: RE | Admit: 2016-02-29 | Discharge: 2016-02-29 | Disposition: A | Discharge status: Home / Self Care | Payer: Medicaid Other | Source: Ambulatory Visit | Attending: Family Medicine | Admitting: Family Medicine

## 2016-02-29 DIAGNOSIS — K7460 Unspecified cirrhosis of liver: Secondary | ICD-10-CM | POA: Insufficient documentation

## 2016-02-29 DIAGNOSIS — R188 Other ascites: Secondary | ICD-10-CM | POA: Insufficient documentation

## 2016-02-29 LAB — GRAM STAIN

## 2016-02-29 NOTE — Procedures (Signed)
Ultrasound-guided diagnostic and therapeutic paracentesis performed yielding 4.5 liters of clear yellow colored fluid. No immediate complications.  Laqueta Bonaventura E 12:42 PM 02/29/2016

## 2016-03-01 ENCOUNTER — Telehealth: Payer: Self-pay | Admitting: *Deleted

## 2016-03-01 NOTE — Telephone Encounter (Signed)
Patient called an left message on nurse line stating her recently had paracentesis done on 4/10 and has been having bad leg cramping since. Wants to know if MD has any suggestions to help with the cramping.

## 2016-03-01 NOTE — Telephone Encounter (Signed)
Spoke with patient on the phone. Advised to use mustard to see if it improves his cramping. If not he will be seen by myself on Thursday and may need to draw a BMP with magnesium and phosphorus since he is on diuretics.   Rosemarie Ax, MD PGY-3, [facility] 03/01/2016, 10:43 AM

## 2016-03-03 ENCOUNTER — Ambulatory Visit (INDEPENDENT_AMBULATORY_CARE_PROVIDER_SITE_OTHER): Payer: Medicaid Other | Admitting: Family Medicine

## 2016-03-03 ENCOUNTER — Encounter: Payer: Self-pay | Admitting: Family Medicine

## 2016-03-03 VITALS — BP 110/67 | HR 89 | Temp 98.1°F | Ht 75.0 in | Wt 282.4 lb

## 2016-03-03 DIAGNOSIS — K7460 Unspecified cirrhosis of liver: Secondary | ICD-10-CM | POA: Diagnosis not present

## 2016-03-03 DIAGNOSIS — R188 Other ascites: Principal | ICD-10-CM

## 2016-03-03 LAB — BASIC METABOLIC PANEL WITH GFR
BUN: 17 mg/dL (ref 7–25)
CALCIUM: 8.4 mg/dL — AB (ref 8.6–10.3)
CHLORIDE: 100 mmol/L (ref 98–110)
CO2: 23 mmol/L (ref 20–31)
CREATININE: 1.26 mg/dL — AB (ref 0.70–1.25)
GFR, EST AFRICAN AMERICAN: 71 mL/min (ref >=60)
GFR, EST NON AFRICAN AMERICAN: 62 mL/min (ref >=60)
Glucose, Bld: 127 mg/dL — ABNORMAL HIGH (ref 65–99)
POTASSIUM: 3.5 mmol/L (ref 3.5–5.3)
Sodium: 137 mmol/L (ref 135–146)

## 2016-03-03 LAB — MAGNESIUM: MAGNESIUM: 1.6 mg/dL (ref 1.5–2.5)

## 2016-03-03 LAB — PHOSPHORUS: Phosphorus: 3.4 mg/dL (ref 2.5–4.5)

## 2016-03-03 NOTE — Patient Instructions (Signed)
Thank you for coming in,   I will call or send a letter with the results from today.   Please makes sure the GI doctor sends me their note.   Dr. Jenne Campus is our nutritionist and you can call her for an appointment.   Please bring all of your medications with you to each visit.   Sign up for My Chart to have easy access to your labs results, and communication with your Primary care physician   Please feel free to call with any questions or concerns at any time, at [phone]. --Dr. Raeford Razor

## 2016-03-03 NOTE — Assessment & Plan Note (Signed)
Given info to make an appt with Dr. Jenne Campus as he has questions in regards to his current medical problems and how to prepare a heart healthy diet.

## 2016-03-03 NOTE — Progress Notes (Signed)
   Subjective:    Vincent Ochoa - 60 y.o. male MRN [id number]  Date of birth: [birth year]  CC cirrhosis With ascites  HPI  Vincent Ochoa is here for cirrhosis with ascites.   He had a paracentesis performed on April 10. He had formed a 4.5 liters taken off at that time. Body culture of the fluid taken at that time has had no growth to this date. The has started the lactulose and reports having bowel movements. He feels much better today and feels less distended. He still have some mild shortness of breath at night when he has to lie down. Pain is much better in terms of his abdomen and his back. Currently doesn't have any edema.  He is having some cramping in his groin that is severe since his paracentesis on Monday. Advised to use the mustard which has helped but not had resolution. The crampiness is usually at night and in his bilateral groin.  He has had 2 admissions to the hospital recently. He has child Pugh C cirrhosis secondary to either alcoholism versus cirrhosis He has had 6 L and 8 L removed through paracentesis  . 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 His metoprolol was also stopped due to his lower blood pressures.  He reports today that the rifaximin cost $2500 and he would not be able to afford that on a regular basis. He has gotten on the [employer] and they do have a discount application process  SH: quit smoking since being admitted in February  PMH: dCHF, bleeding varices, CAD, Thrombocytopenia, morbid obesity, Psoriasis, HLD, OSA, Cirrhosis   Health Maintenance:  Health Maintenance Due  Topic Date Due  . COLONOSCOPY  02/27/2006  . ZOSTAVAX  02/28/2016    Review of Systems See HPI     Objective:   Physical Exam BP 110/67 mmHg   Pulse 89  Temp(Src) 98.1 F (36.7 C) (Oral)  Ht 6\' 3"  (1.905 m)  Wt 282 lb 6.4 oz (128.096 kg)  BMI 35.30 kg/m2 Gen: NAD, alert, cooperative with exam,  CV: RRR, good S1/S2, no murmur, no edema,  Resp: CTABL, no wheezes, non-labored Abd: Nontender and mildly distended, positive bowel sounds, no guarding or rigidity Skin: no rashes, normal turgor  Neuro: no gross deficits.     Assessment & Plan:   Cirrhosis of liver with ascites ([place]) Had another 4.5 L taken off and culture without growth to date.  Feel much better  Still having dyspnea with lying down but much improved.  Taking the lactulose and having bowel movements.  - has follow up with GI on the 18th  - advised to follow up as they direct him after the GI appt.

## 2016-03-03 NOTE — Assessment & Plan Note (Addendum)
Had another 4.5 L taken off and culture without growth to date.  Feel much better  Still having dyspnea with lying down but much improved.  Taking the lactulose and having bowel movements.  - has follow up with GI on the 18th  - advised to follow up as they direct him after the GI appt. Marland Kitchen

## 2016-03-05 LAB — CULTURE, BODY FLUID-BOTTLE: CULTURE: NO GROWTH

## 2016-03-07 ENCOUNTER — Other Ambulatory Visit (HOSPITAL_COMMUNITY)
Admission: RE | Admit: 2016-03-07 | Discharge: 2016-03-07 | Disposition: A | Discharge status: Home / Self Care | Payer: Medicaid Other | Source: Ambulatory Visit | Attending: Interventional Radiology | Admitting: Interventional Radiology

## 2016-03-07 DIAGNOSIS — I864 Gastric varices: Secondary | ICD-10-CM | POA: Insufficient documentation

## 2016-03-07 LAB — COMPREHENSIVE METABOLIC PANEL
ALBUMIN: 2.9 g/dL — AB (ref 3.5–5.0)
ALK PHOS: 107 U/L (ref 38–126)
ALT: 53 U/L (ref 17–63)
ANION GAP: 9 (ref 5–15)
AST: 68 U/L — ABNORMAL HIGH (ref 15–41)
BUN: 18 mg/dL (ref 6–20)
CALCIUM: 8.4 mg/dL — AB (ref 8.9–10.3)
CHLORIDE: 99 mmol/L — AB (ref 101–111)
CO2: 27 mmol/L (ref 22–32)
Creatinine, Ser: 1.24 mg/dL (ref 0.61–1.24)
GFR calc Af Amer: >60 mL/min (ref >60)
GFR calc non Af Amer: >60 mL/min (ref >60)
GLUCOSE: 199 mg/dL — AB (ref 65–99)
POTASSIUM: 3.4 mmol/L — AB (ref 3.5–5.1)
SODIUM: 135 mmol/L (ref 135–145)
Total Bilirubin: 1.7 mg/dL — ABNORMAL HIGH (ref 0.3–1.2)
Total Protein: 7.5 g/dL (ref 6.5–8.1)

## 2016-03-08 ENCOUNTER — Encounter: Payer: Self-pay | Admitting: Family Medicine

## 2016-03-10 ENCOUNTER — Encounter (HOSPITAL_COMMUNITY): Payer: Self-pay

## 2016-03-10 ENCOUNTER — Ambulatory Visit (HOSPITAL_COMMUNITY)
Admission: RE | Admit: 2016-03-10 | Discharge: 2016-03-10 | Disposition: A | Discharge status: Home / Self Care | Payer: Medicaid Other | Source: Ambulatory Visit | Attending: Radiology | Admitting: Radiology

## 2016-03-10 ENCOUNTER — Ambulatory Visit
Admission: RE | Admit: 2016-03-10 | Discharge: 2016-03-10 | Disposition: A | Discharge status: Home / Self Care | Payer: No Typology Code available for payment source | Source: Ambulatory Visit | Attending: Radiology | Admitting: Radiology

## 2016-03-10 DIAGNOSIS — K766 Portal hypertension: Secondary | ICD-10-CM

## 2016-03-10 DIAGNOSIS — K922 Gastrointestinal hemorrhage, unspecified: Secondary | ICD-10-CM

## 2016-03-10 DIAGNOSIS — R188 Other ascites: Secondary | ICD-10-CM | POA: Diagnosis not present

## 2016-03-10 DIAGNOSIS — I864 Gastric varices: Secondary | ICD-10-CM | POA: Diagnosis present

## 2016-03-10 DIAGNOSIS — K7460 Unspecified cirrhosis of liver: Secondary | ICD-10-CM | POA: Insufficient documentation

## 2016-03-10 MED ORDER — IOPAMIDOL (ISOVUE-300) INJECTION 61%
100.0000 mL | Freq: Once | INTRAVENOUS | Status: AC | PRN
  Administered 2016-03-10: 100 mL via INTRAVENOUS

## 2016-03-10 NOTE — Progress Notes (Signed)
Patient ID: Vincent Ochoa, male   DOB: 06/05/1956, 60 y.o.   MRN: [id number]        Chief Complaint: Post BRTO  Referring Physician(s): Schooler (GI)  History of Present Illness: Vincent Ochoa is a 60 y.o. male with past medical history significant for CAD, paroxysmal atrial fibrillation (not currently on anticoagulation), hyperlipidemia, obesity, psoriasis, elevated LFT and thrombocytopenia who underwent an emergent BRTO procedure for enlarged bleeding gastric varices on 02/04/2016. He presents to the interventional radiology clinic for post procedural evaluation and management. He is unaccompanied and serves as his own historian.  The patient states that he is fatigued though otherwise without complaint.  Importantly, the patient denies any recurrent episodes of hematemesis.  He states he has noticed an increased abdominal girth attributable to recurrent ascites (for which he has undergone ultrasound-guided paracenteses on 02/29/2016; 02/17/2016 and 02/10/2016 yielding 4.5, 8.4 and 6.2 L of ascitic fluid respectively) though states this is not particularly symptomatic. He denies lower extremity swelling. He denies yellowing of the skin or eyes. No change in mental status or episodes of confusion.    Past Medical History  Diagnosis Date  . GERD (gastroesophageal reflux disease)   . Psoriasis   . Frequent headaches   . Facial cellulitis 2014    Periorbital  . Coronary artery disease     a. Evaluated by Dr. Stanford Breed 2012 - nonobstructive 2007-2008 by cath. b. Abnormal stress test 10/2014 but cath deferred temporarily due to thrombocytopenia. c. 10/2014: readm with AF-RVR/NSTEMI - s/p BMS to mLCx 11/04/14 (mod dz in RCA);  d. relook cath 01/2015 and 06/02/2015 patent stent, nonobs CAD, EF 55-65%.  . Back pain, lumbosacral 2014  . Thrombocytopenia ([place])     a. Onset unclear, but noted 2012. ? 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 s/p prednisone, IVIG.  Marland Kitchen  Hyperlipidemia   . Obesity   . Elevated LFTs   . Fatty liver US - 2012  . PAF (paroxysmal atrial fibrillation) ([place])     a. Episode 10/2014 at time of NSTEMI, spont converted to NSR. Observation for further episodes (not on anticoag at present time due to [facility] = 1, thrombocytopenia).  . Sinus bradycardia   . OSA (obstructive sleep apnea)     severe with AHI 56/hr with successful CPAP titration to 18cm H2O  . Morbid obesity [facility])     Past Surgical History  Procedure Laterality Date  . Tonsillectomy  1960's  . Cholecystectomy  2007  . Knee arthroplasty  1970's  . Facial cosmetic surgery  2014  . Cardiac catheterization    . Left heart catheterization with coronary angiogram N/A 11/04/2014    Procedure: LEFT HEART CATHETERIZATION WITH CORONARY ANGIOGRAM;  Surgeon: Jettie Booze, MD;  Location: [facility] CATH LAB;  Service: Cardiovascular;  Laterality: N/A;  . Left heart catheterization with coronary angiogram N/A 01/28/2015    Procedure: LEFT HEART CATHETERIZATION WITH CORONARY ANGIOGRAM;  Surgeon: Sherren Mocha, MD;  Location: [facility] CATH LAB;  Service: Cardiovascular;  Laterality: N/A;  . Cardiac catheterization N/A 06/02/2015    Procedure: Left Heart Cath and Coronary Angiography;  Surgeon: Jettie Booze, MD;  Location: [place] CV LAB;  Service: Cardiovascular;  Laterality: N/A;  . Radiology with anesthesia N/A 02/04/2016    Procedure: RADIOLOGY WITH ANESTHESIA;  Surgeon: Medication Radiologist, MD;  Location: [place];  Service: Radiology;  Laterality: N/A;    Allergies: Review of patient's allergies indicates no known allergies.  Medications: Prior to Admission  medications   Medication Sig Start Date End Date Taking? Authorizing Provider  aspirin 81 MG tablet Take 1 tablet (81 mg total) by mouth daily. 02/22/16  Yes Archie Patten, MD  buPROPion (WELLBUTRIN XL) 150 MG 24 hr tablet Take 1 tablet (150 mg total) by mouth daily. 02/19/16  Yes Archie Patten, MD    cyclobenzaprine (FLEXERIL) 10 MG tablet Take 1 tablet (10 mg total) by mouth 3 (three) times daily as needed for muscle spasms. 02/19/16  Yes Archie Patten, MD  furosemide (LASIX) 80 MG tablet Take 1 tablet (80 mg total) by mouth 2 (two) times daily. 02/19/16  Yes Archie Patten, MD  isosorbide mononitrate (IMDUR) 30 MG 24 hr tablet Take 1 tablet (30 mg total) by mouth daily. 02/19/16  Yes Archie Patten, MD  Lactulose 20 GM/30ML SOLN Take 45 mLs (30 g total) by mouth 2 (two) times daily. 02/23/16  Yes Rosemarie Ax, MD  nitroGLYCERIN (NITROSTAT) 0.4 MG SL tablet Place 1 tablet (0.4 mg total) under the tongue every 5 (five) minutes as needed for chest pain (up to 3 doses). 10/31/14  Yes Dayna N Dunn, PA-C  pantoprazole (PROTONIX) 40 MG tablet Take 1 tablet (40 mg total) by mouth 2 (two) times daily. 02/19/16  Yes Archie Patten, MD  spironolactone (ALDACTONE) 100 MG tablet Take 2 tablets (200 mg total) by mouth daily. 02/19/16  Yes Archie Patten, MD  buPROPion (WELLBUTRIN XL) 150 MG 24 hr tablet Take 150 mg by mouth daily.    Historical Provider, MD  docusate sodium (COLACE) 100 MG capsule Take 1 capsule (100 mg total) by mouth 2 (two) times daily. Patient not taking: Reported on 03/10/2016 02/13/16   Reyne Dumas, MD  rifaximin (XIFAXAN) 550 MG TABS tablet Take 1 tablet (550 mg total) by mouth 2 (two) times daily. 02/19/16   Archie Patten, MD     Family History  Problem Relation Age of Onset  . Arthritis Maternal Grandmother   . Heart disease Sister   . Diabetes Sister   . Cancer - Ovarian Sister   . Heart disease      Maternal/paternal grandparents  . Cirrhosis Mother   . Heart attack Mother   . Heart attack Maternal Aunt   . Hypertension Mother   . Hypertension Father   . Stroke Father     Social History   Social History  . Marital Status: Married    Spouse Name: N/A  . Number of Children: N/A  . Years of Education: N/A   Social History Main Topics  . Smoking  status: Former Smoker -- 0.20 packs/day for 43 years    Types: Cigarettes  . Smokeless tobacco: Never Used     Comment: 11/28/2012 "chew occasionally"  Quit for 2.5 months in early 2016  . Alcohol Use: 0.0 oz/week    0 Standard drinks or equivalent per week     Comment: 11/28/2012 "beer q 6 months or so"  . Drug Use: No  . Sexual Activity: No   Other Topics Concern  . Not on file   Social History Narrative   Lives in [place].    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     ECOG Status: 1 - Symptomatic but completely ambulatory  Review of Systems: A 12 point ROS discussed and pertinent positives are indicated in the HPI above.  All other systems are negative.  Review of Systems  Vital Signs: BP 113/62 mmHg  Pulse 90  Temp(Src) 98.3 F (36.8 C) (Oral)  Resp 15  Ht 6\' 3"  (1.905 m)  Wt 276 lb (125.193 kg)  BMI 34.50 kg/m2  SpO2 97%  Physical Exam   Imaging:  Selected images from BRTO protocol CT performed earlier today and 02/03/2016 as well as intraprocedural images during for fluoroscopic guided BRTO procedure performed 02/04/2016 were reviewed in detail with the patient.  Ct Abdomen Pelvis W Wo Contrast  03/10/2016  ADDENDUM REPORT: 03/10/2016 14:23 ADDENDUM: The following is an addition to the above report: There is a minimal amount of residual nonocclusive thrombus within the main portal vein extending from the level of the portal-splenule confluence (image 41, series 5) to the level just prior to the portal vein's bifurcation (representative image 36, series 5), unchanged since the 02/16/2016 examination. Electronically Signed   By: Sandi Mariscal M.D.   On: 03/10/2016 14:23  03/10/2016  CLINICAL DATA:  Post BRTO for bleeding gastric varices on 02/05/2016 EXAM: CT ABDOMEN AND PELVIS WITHOUT AND WITH CONTRAST TECHNIQUE: Multidetector CT imaging of the abdomen and pelvis was performed following the  standard protocol before and following the bolus administration of intravenous contrast. CONTRAST:  187mL ISOVUE-300 IOPAMIDOL (ISOVUE-300) INJECTION 61% COMPARISON:  CT abdomen pelvis - 02/03/2016 ; 02/08/2016; 02/16/2016 ; ultrasound-guided paracentesis - 02/29/2016 ; 02/17/2016 FINDINGS: Vascular Findings: BRTO specific Findings: Sclerosant is again seen throughout the intraluminal portions of the known gastric varix. There has been slight decrease in size of the gastric varix compared to the immediate postprocedural scan performed 02/08/2016 with index component of the varix currently measuring approximately 1.5 centimeters in greatest oblique coronal diameter (coronal image 105, series 602), previously, 2.4 centimeters, though note, exact measurements are difficult secondary to the irregular shape of the varix. Sclerosant is again seen extending into the efferent limb without extension to either the splenic or portal veins. There is a minimal amount of nonocclusive sclerosant seen within the cranial aspect of the left renal vein, not resulting in a hemodynamically significant stenosis (coronal image 101, series 604). No evidence of renal infarction or perinephric stranding. Abdominal aorta: Scattered mixed calcified and noncalcified atherosclerotic plaque within a mildly ectatic infrarenal abdominal aorta measuring approximately 2.6 centimeters in greatest oblique coronal diameter (coronal image 89, series 602). No abdominal aortic dissection or periaortic stranding. Celiac artery: Widely patent without hemodynamically significant narrowing. Conventional branching pattern. SMA: Widely patent without hemodynamically significant narrowing. Conventional branching pattern. The distal tributaries the SMA appear widely patent without discrete intraluminal filling defect to suggest distal embolism. Right Renal artery: Solitary; widely patent without hemodynamically significant narrowing. No vessel irregularity to  suggest FMD. Left Renal artery: Duplicated; there is a tiny accessory left renal artery which supplies the superior pole the left kidney. The dominant left-sided renal artery is widely patent without hemodynamically significant narrowing. No vessel irregularity to suggest FMD. IMA: Widely patent without hemodynamically significant narrowing. Pelvic vasculature: Minimal amount of mixed calcified and noncalcified atherosclerotic plaque within the normal caliber bilateral common iliac arteries, not resulting in hemodynamically significant stenosis. The bilateral internal iliac arteries are mildly disease though patent and of normal caliber. The bilateral external iliac arteries are widely patent without hemodynamically significant stenosis. Review of the MIP images confirms the above findings. -------------------------------------------------------------------------------- Nonvascular Findings: Lower chest: Limited visualization of the lower thorax demonstrates minimal dependent subpleural ground-glass atelectasis. No focal airspace opacities. Normal heart size. No pericardial effusion. Coronary artery  calcifications. Hepatobiliary: Re- demonstrated nodular contour of the liver compatible with known cirrhosis. No discrete hyper enhancing hepatic lesions. Post cholecystectomy. No intra extrahepatic bili duct dilatation. Small to moderate size intra-abdominal ascites, improved compared to the 02/16/2016 examination. Pancreas: Slightly atrophic but otherwise normal appearance. Spleen: Re- demonstrated splenomegaly with the spleen measuring approximately 17.9 centimeters in length. Small amount of perisplenic ascites. No evidence of splenic infarction. Adrenals/Urinary Tract: There is symmetric enhancement of the bilateral kidneys. Scattered right-sided nonobstructing renal stones with dominant punctate (approximately 0.7 centimeter) stone within the inferior pole the right kidney (image 50, series 2). No evidence of  left-sided nephrolithiasis. No renal stones are seen along the expected course of either ureter or the urinary bladder. Normal appearance of the urinary bladder given degree distention. No discrete renal lesions. There is a minimal amount of nonocclusive sclerosant seen within the cranial aspect of the left renal vein, not resulting in a hemodynamically significant stenosis (coronal image 101, series 604). No evidence of renal infarction or perinephric stranding. Stomach/Bowel: Sclerosant is seen throughout the intraluminal portions of the known gastric varix. There has been slight decrease in size of the gastric varix compared to the immediate postprocedural scan performed 02/08/2016. Sclerosant is again seen extending into the efferent limb without extension to either the splenic or portal veins. Scattered colonic diverticulosis without evidence of diverticulitis. Moderate colonic stool burden without evidence of enteric obstruction. The bowel is otherwise normal in course and caliber without wall thickening. Normal appearance of the terminal ileum. No pneumoperitoneum, pneumatosis or portal venous gas. Lymphatic: Re- demonstrated prominent porta hepatis and upper abdominal retroperitoneal lymph nodes with index gastrohepatic ligament lymph node measuring 1.3 cm in diameter (image 51, series 3 close and index left periaortic lymph node measuring 0.9 centimeters (image 76, series 3). Reproductive: Dystrophic calcification within a normal sized prostate gland. Other: Mild diffuse body wall anasarca. Tiny right-sided mesenteric fat containing direct inguinal hernia. Musculoskeletal: No acute or aggressive osseous abnormalities. Unchanged mild (< 25 %) compression deformity involving the superior endplate of the L2 vertebral body. Mild degenerative change of the bilateral hips with joint space loss, subchondral sclerosis and osteophytosis. IMPRESSION: 1. Technically successful BRT0 for large gastric varices without  evidence of clinically significant complication. Overall, the size of the intraluminal portion of the gastric varix has decreased in size when compared with the immediate postprocedural scan performed 02/08/2016. 2. Unchanged minimal amount of nonocclusive sclerosant within the cranial aspect of the left renal vein, not resulting in hemodynamically significant stenosis or evidence of left renal infarction or vascular congestion. 3. Similar findings of cirrhosis and portal venous hypertension with persistent small to moderate volume intra-abdominal ascites. Electronically Signed: By: Sandi Mariscal M.D. On: 03/10/2016 11:31   Ct Abdomen Pelvis W Contrast  02/16/2016  CLINICAL DATA:  60 year old male with abdominal pain and distention. EXAM: CT ABDOMEN AND PELVIS WITH CONTRAST TECHNIQUE: Multidetector CT imaging of the abdomen and pelvis was performed using the standard protocol following bolus administration of intravenous contrast. CONTRAST:  1 ISOVUE-300 IOPAMIDOL (ISOVUE-300) INJECTION 61% COMPARISON:  CT dated 02/08/2016 FINDINGS: Trace right pleural effusion. No intra-abdominal free air.  Small ascites similar prior study. Cirrhosis. Cholecystectomy. The pancreas appears unremarkable. Splenomegaly measuring 17 cm. The adrenal glands appear unremarkable. There is a 4 mm nonobstructing right renal inferior pole calculus. No hydronephrosis. The visualized ureters urinary bladder appear unremarkable. The prostate and seminal vesicles are grossly unremarkable. There is a 1.5 cm diverticulum arising from the medial wall of the second portion  of the duodenum. There is no evidence of bowel obstruction. No definite active inflammatory changes of the bowel identified, however evaluation is limited due to ascites. The appendix appears unremarkable. Mild aortoiliac atherosclerotic disease. There is a 3.2 cm infrarenal abdominal aortic aneurysm similar prior study. No portal venous gas identified. Ill-defined vague  hypodensity in the main portal vein is likely artifactual related mixing of the contrast. The thrombus is less likely. Stable upper abdominal and gastric varices status post BRTO with sclerosant material material within the gastric versus similar the prior study. A dilated gastric varix segment along the lesser curvature of the stomach at the gastroesophageal junction remains patent. There is no adenopathy. There is mild diffuse stranding of the subcutaneous soft tissues of the abdominal wall. Mild digit changes of the spine. No acute fracture. IMPRESSION: Cirrhosis with evidence of portal hypertension, splenomegaly, and small ascites similar prior study. Stable appearing post procedural changes of BRTO with sclerosant material in the gastric varices similar to prior study. A mildly dilated gastric varices along the lesser curvature of the stomach at the gastroesophageal junction remains patent. No evidence of bowel obstruction. Small nonobstructing right renal inferior pole calculus. Electronically Signed   By: Anner Crete M.D.   On: 02/16/2016 22:12   US Abdomen Limited  02/13/2016  CLINICAL DATA:  Cirrhosis.  Status post BRTO. EXAM: LIMITED ABDOMEN ULTRASOUND FOR ASCITES TECHNIQUE: Limited ultrasound survey for ascites was performed in all four abdominal quadrants.\ COMPARISON:  Multiple priors.  Most recent CT 02/08/2016. FINDINGS: A moderate amount of ascites is noted in all 4 quadrants. IMPRESSION: Positive for ascites. Electronically Signed   By: Staci Righter M.D.   On: 02/13/2016 14:06   US Paracentesis  02/29/2016  INDICATION: The patient has a history of cirrhosis with known ascites. He has had several paracenteses before. He presents today for a diagnostic and therapeutic paracentesis. EXAM: ULTRASOUND GUIDED DIAGNOSTIC AND THERAPEUTIC PARACENTESIS MEDICATIONS: 1% lidocaine COMPLICATIONS: None immediate. PROCEDURE: Informed written consent was obtained from the patient after a discussion of  the risks, benefits and alternatives to treatment. A timeout was performed prior to the initiation of the procedure. Initial ultrasound scanning demonstrates a moderate amount of ascites within the right upper abdominal quadrant. An ultrasound image was saved for documentation purposes. The right upper abdomen was prepped and draped in the usual sterile fashion. 1% lidocaine was used for local anesthesia. Following this, a Safe-T-Centesis catheter was introduced. The paracentesis was performed. The catheter was removed and a dressing was applied. The patient tolerated the procedure well without immediate post procedural complication. FINDINGS: A total of approximately 4.5 L of clear yellow fluid was removed. Samples were sent to the laboratory as requested by the clinical team. IMPRESSION: Successful ultrasound-guided paracentesis yielding 4.5 liters of peritoneal fluid. Read by: Saverio Danker, PA-C Electronically Signed   By: Jacqulynn Cadet M.D.   On: 02/29/2016 11:04   US Paracentesis  02/17/2016  INDICATION: Ascites secondary to cirrhosis. History of BRTO. Request for diagnostic and therapeutic paracentesis. EXAM: ULTRASOUND GUIDED LEFT LOWER QUADRANT PARACENTESIS MEDICATIONS: 1% Lidocaine. COMPLICATIONS: None immediate. PROCEDURE: Informed written consent was obtained from the patient after a discussion of the risks, benefits and alternatives to treatment. A timeout was performed prior to the initiation of the procedure. Initial ultrasound scanning demonstrates a large amount of ascites within the right lower abdominal quadrant. The right lower abdomen was prepped and draped in the usual sterile fashion. 1% lidocaine with epinephrine was used for local anesthesia. Following this, a  6 Fr Safe-T-Centesis catheter was introduced. An ultrasound image was saved for documentation purposes. The paracentesis was performed. The catheter was removed and a dressing was applied. The patient tolerated the procedure  well without immediate post procedural complication. FINDINGS: A total of approximately 8.4 liters of clear yellow fluid was removed. Samples were sent to the laboratory as requested by the clinical team. IMPRESSION: Successful ultrasound-guided paracentesis yielding 8.4 liters of peritoneal fluid. Read by:  Gareth Eagle, PA-C Electronically Signed   By: Jerilynn Mages.  Shick M.D.   On: 02/17/2016 14:50   US Paracentesis  02/10/2016  INDICATION: Cirrhosis, recent hematemesis ,status post BRTO (balloon occluded transvenous obliteration of gastric varices ) on 02/05/16, ascites. Request made for diagnostic and therapeutic paracentesis. EXAM: ULTRASOUND GUIDED DIAGNOSTIC AND THERAPEUTIC PARACENTESIS MEDICATIONS: None. COMPLICATIONS: None immediate. PROCEDURE: Informed written consent was obtained from the patient after a discussion of the risks, benefits and alternatives to treatment. A timeout was performed prior to the initiation of the procedure. Initial ultrasound scanning demonstrates a large amount of ascites within the right lower abdominal quadrant. The right lower abdomen was prepped and draped in the usual sterile fashion. 1% lidocaine was used for local anesthesia. Following this, a Yueh catheter was introduced. An ultrasound image was saved for documentation purposes. The paracentesis was performed. The catheter was removed and a dressing was applied. The patient tolerated the procedure well without immediate post procedural complication. FINDINGS: A total of approximately 6.2 liters of light yellow fluid was removed. Samples were sent to the laboratory as requested by the clinical team. IMPRESSION: Successful ultrasound-guided diagnostic and therapeutic paracentesis yielding 6.2 liters of peritoneal fluid. Read by: Rowe Robert, PA-C Electronically Signed   By: Lucrezia Europe M.D.   On: 02/10/2016 16:48   Dg Chest Port 1 View  02/17/2016  CLINICAL DATA:  Acute onset of shortness of breath and dyspnea. Initial  encounter. EXAM: PORTABLE CHEST 1 VIEW COMPARISON:  Chest radiograph performed 02/07/2016 FINDINGS: Lung expansion is mildly improved. Vascular congestion is noted. Patchy bibasilar airspace opacities may reflect mild pulmonary edema or possibly pneumonia. No pleural effusion or pneumothorax is seen. The cardiomediastinal silhouette is normal in size. No acute osseous abnormalities are seen. IMPRESSION: Vascular congestion noted. Patchy bibasilar airspace opacities may reflect mild pulmonary edema or possibly pneumonia. Electronically Signed   By: Garald Balding M.D.   On: 02/17/2016 00:38    Labs:  CBC:  Recent Labs  02/16/16 1203 02/17/16 0158 02/18/16 0626 02/19/16 0545  WBC 5.5 4.9 4.2 3.6*  HGB 9.3* 8.9* 8.3* 8.5*  HCT 28.3* 28.6* 26.7* 26.2*  PLT 85* 82* 70* 68*    COAGS:  Recent Labs  02/05/16 0111 02/07/16 0704 02/10/16 1418 02/17/16 0158  INR 1.45 1.43 1.39 1.57*  APTT  --  30  --   --     BMP:  Recent Labs  02/18/16 0626 02/19/16 0545 03/03/16 1102 03/07/16 1124  NA 135 136 137 135  K 4.1 3.9 3.5 3.4*  CL 101 101 100 99*  CO2 27 29 23 27   GLUCOSE 95 97 127* 199*  BUN 12 12 17 18   CALCIUM 7.7* 7.7* 8.4* 8.4*  CREATININE 1.12 1.24 1.26* 1.24  GFRNONAA >60 >60 62 >60  GFRAA >60 >60 71 >60    LIVER FUNCTION TESTS:  Recent Labs  02/16/16 1203 02/17/16 0158 02/19/16 0545 03/07/16 1124  BILITOT 2.5* 1.7* 1.0 1.7*  AST 106* 91* 64* 68*  ALT 90* 80* 63 53  ALKPHOS 102 89 88  107  PROT 6.5 6.0* 5.7* 7.5  ALBUMIN 2.6* 2.4* 2.1* 2.9*    TUMOR MARKERS: No results for input(s): AFPTM, CEA, CA199, CHROMGRNA in the last 8760 hours.  Assessment and Plan:  Vincent Ochoa is a 60 y.o. male with past medical history significant for CAD, paroxysmal atrial fibrillation (not currently on anticoagulation), hyperlipidemia, obesity, psoriasis, elevated LFT and thrombocytopenia who underwent an emergent BRTO procedure for enlarged bleeding gastric varices on  02/04/2016.  Review of BRTO CT performed earlier today demonstrates a technically excellent result without evidence of a clinically significant complication. There has been slight decrease in size of the sclerosed intraluminal gastric varices. A minimal amount of nonocclusive sclerosant is noted within the cranial aspect the left renal vein without evidence of renal infarction or vascular congestion. There is a small amount of nonocclusive thrombus within the portal vein, similar to the 02/16/2016 examination, without evidence of propagation.  Selected images from BRTO protocol CT performed earlier today and 02/03/2016 as well as intraprocedural images during for fluoroscopic guided BRTO procedure performed 02/04/2016 were reviewed in detail with the patient.  Regard to the patient's expected post procedural ascites, optimizing medical management is advised. Unfortunately the patient is not taking his Xifaxan due to cost of this medication, though states he has been compliant with both lactulose and Lasix. I am hopeful that patient will require less frequent and decreased volume paracenteses over the coming months as he continues to recover from this procedure.  Ultimately, if ascites becomes recurrent and sympotomatic, the patient may be evaluated for TIPS.  The patient as well as I, remain confused as to the etiology of his cirrhosis, as he denies history of significant alcohol abuse.  I will defer to Dr. Michail Sermon regarding further workup as to the etiology of his cirrhosis.  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  As such, we will coordinate for the patient to undergo endoscopic evaluation of his varices in July 2017 with subsequent surveillance BRTO protocol abdominal CT In September 2017.  I will see the patient in follow-up consultation following the acquisition of  subsequent surveillance BRTO protocol CT in September 2017.  The patient demonstrated excellent understanding of the above discussion and recommendations.  The patient was encouraged to call the interventional radiology clinic with any interval questions or concerns.  A copy of this report was sent to the requesting provider on this date.  Electronically Signed: Sandi Mariscal 03/10/2016, 4:54 PM   I spent a total of 25 Minutes in face to face in clinical consultation, greater than 50% of which was counseling/coordinating care for post BRTO

## 2016-03-14 ENCOUNTER — Encounter: Payer: Self-pay | Admitting: Family Medicine

## 2016-03-14 ENCOUNTER — Other Ambulatory Visit (HOSPITAL_BASED_OUTPATIENT_CLINIC_OR_DEPARTMENT_OTHER): Payer: Medicaid Other

## 2016-03-14 ENCOUNTER — Telehealth: Payer: Self-pay | Admitting: Hematology and Oncology

## 2016-03-14 ENCOUNTER — Ambulatory Visit (HOSPITAL_BASED_OUTPATIENT_CLINIC_OR_DEPARTMENT_OTHER): Payer: Medicaid Other | Admitting: Hematology and Oncology

## 2016-03-14 ENCOUNTER — Ambulatory Visit: Payer: Self-pay | Admitting: Family Medicine

## 2016-03-14 ENCOUNTER — Encounter: Payer: Self-pay | Admitting: Hematology and Oncology

## 2016-03-14 VITALS — BP 125/67 | HR 97 | Temp 98.2°F | Resp 18 | Wt 267.0 lb

## 2016-03-14 DIAGNOSIS — D696 Thrombocytopenia, unspecified: Secondary | ICD-10-CM

## 2016-03-14 DIAGNOSIS — D500 Iron deficiency anemia secondary to blood loss (chronic): Secondary | ICD-10-CM | POA: Diagnosis not present

## 2016-03-14 DIAGNOSIS — L409 Psoriasis, unspecified: Secondary | ICD-10-CM

## 2016-03-14 DIAGNOSIS — R180 Malignant ascites: Secondary | ICD-10-CM | POA: Diagnosis not present

## 2016-03-14 LAB — CBC WITH DIFFERENTIAL/PLATELET
BASO%: 0.5 % (ref 0.0–2.0)
BASOS ABS: 0 10*3/uL (ref 0.0–0.1)
EOS%: 2.6 % (ref 0.0–7.0)
Eosinophils Absolute: 0.1 10*3/uL (ref 0.0–0.5)
HEMATOCRIT: 34 % — AB (ref 38.4–49.9)
HEMOGLOBIN: 10.8 g/dL — AB (ref 13.0–17.1)
LYMPH#: 0.6 10*3/uL — AB (ref 0.9–3.3)
LYMPH%: 14.8 % (ref 14.0–49.0)
MCH: 28.1 pg (ref 27.2–33.4)
MCHC: 31.8 g/dL — ABNORMAL LOW (ref 32.0–36.0)
MCV: 88.2 fL (ref 79.3–98.0)
MONO#: 0.4 10*3/uL (ref 0.1–0.9)
MONO%: 10.8 % (ref 0.0–14.0)
NEUT%: 71.3 % (ref 39.0–75.0)
NEUTROS ABS: 2.9 10*3/uL (ref 1.5–6.5)
Platelets: 66 10*3/uL — ABNORMAL LOW (ref 140–400)
RBC: 3.85 10*6/uL — ABNORMAL LOW (ref 4.20–5.82)
RDW: 16.6 % — AB (ref 11.0–14.6)
WBC: 4.1 10*3/uL (ref 4.0–10.3)

## 2016-03-14 NOTE — Progress Notes (Signed)
[facility] OFFICE PROGRESS NOTE  Clearance Coots, MD SUMMARY OF HEMATOLOGIC HISTORY:  Vincent Ochoa had history of cardiac disease was admitted via Emergency Department for recurrent chest pain.He was initially seen in consultation on 12/8 for thrombocytopenia, felt to be likely autoimmune (related to psoriasis and liver disease), responding well to 2 doses of IVIG in addition to prednisone and transfusions. Patient had been discharged on 12/11 home with baby aspirin. He was subsequently readmitted to the hospital again on 11/03/2014 with recurrent chest pain and subsequently underwent cardiac catheterization and placement of bare metal stent on 11/04/2014. On 12/31/2014, he discontinued prednisone Between February to end of March 2017, he had recurrent admission to the hospital due to chest pain, acute GI bleed, encephalopathy, bleeding gastric varices, alcoholic cirrhosis of the liver and respiratory failure. He received embolization of bleeding vessels and recurrent episodes of paracentesis for abdominal ascites INTERVAL HISTORY: Vincent Ochoa 60 y.o. male returns for further follow-up. He has quit smoking. He had one episode of mild epistaxis recently, after he blows his nose, resolved spontaneously. He denies hematuria or hematochezia. He is motivated to get better and visited with dietitian today. He has lost over 30 pounds since I saw him. He denies recent flare of psoriasis Denies chest pain or shortness of breath  I have reviewed the past medical history, past surgical history, social history and family history with the patient and they are unchanged from previous note.  ALLERGIES:  has No Known Allergies.  MEDICATIONS:  Current Outpatient Prescriptions  Medication Sig Dispense Refill  . aspirin 81 MG tablet Take 1 tablet (81 mg total) by mouth daily. 30 tablet 1  . buPROPion (WELLBUTRIN XL) 150 MG 24 hr tablet Take 150 mg by mouth daily.    . cyclobenzaprine  (FLEXERIL) 10 MG tablet Take 1 tablet (10 mg total) by mouth 3 (three) times daily as needed for muscle spasms. 90 tablet 0  . docusate sodium (COLACE) 100 MG capsule Take 1 capsule (100 mg total) by mouth 2 (two) times daily. 10 capsule 0  . furosemide (LASIX) 80 MG tablet Take 1 tablet (80 mg total) by mouth 2 (two) times daily. 120 tablet 0  . isosorbide mononitrate (IMDUR) 30 MG 24 hr tablet Take 1 tablet (30 mg total) by mouth daily. 30 tablet 1  . Lactulose 20 GM/30ML SOLN Take 45 mLs (30 g total) by mouth 2 (two) times daily. 473 mL 3  . nitroGLYCERIN (NITROSTAT) 0.4 MG SL tablet Place 1 tablet (0.4 mg total) under the tongue every 5 (five) minutes as needed for chest pain (up to 3 doses). 25 tablet 3  . pantoprazole (PROTONIX) 40 MG tablet Take 1 tablet (40 mg total) by mouth 2 (two) times daily. 60 tablet 2  . rifaximin (XIFAXAN) 550 MG TABS tablet Take 1 tablet (550 mg total) by mouth 2 (two) times daily. 60 tablet 0  . spironolactone (ALDACTONE) 100 MG tablet Take 2 tablets (200 mg total) by mouth daily. 60 tablet 0   No current facility-administered medications for this visit.     REVIEW OF SYSTEMS:   Constitutional: Denies fevers, chills or night sweats Eyes: Denies blurriness of vision Ears, nose, mouth, throat, and face: Denies mucositis or sore throat Respiratory: Denies cough, dyspnea or wheezes Cardiovascular: Denies palpitation, chest discomfort or lower extremity swelling Gastrointestinal:  Denies nausea, heartburn or change in bowel habits Lymphatics: Denies new lymphadenopathy or easy bruising Neurological:Denies numbness, tingling or new weaknesses Behavioral/Psych: Mood is  stable, no new changes  All other systems were reviewed with the patient and are negative.  PHYSICAL EXAMINATION: ECOG PERFORMANCE STATUS: 1 - Symptomatic but completely ambulatory  Filed Vitals:   03/14/16 1414  BP: 125/67  Pulse: 97  Temp: 98.2 F (36.8 C)  Resp: 18   Filed Weights    03/14/16 1414  Weight: 267 lb (121.11 kg)    GENERAL:alert, no distress and comfortable. He is morbidly obese SKIN: Noted persistent skin rash from psoriasis EYES: normal, Conjunctiva are pink and non-injected, sclera clear OROPHARYNX:no exudate, no erythema and lips, buccal mucosa, and tongue normal  Musculoskeletal:no cyanosis of digits and no clubbing  NEURO: alert & oriented x 3 with fluent speech, no focal motor/sensory deficits  LABORATORY DATA:  I have reviewed the data as listed     Component Value Date/Time   NA 135 03/07/2016 1124   K 3.4* 03/07/2016 1124   CL 99* 03/07/2016 1124   CO2 27 03/07/2016 1124   GLUCOSE 199* 03/07/2016 1124   BUN 18 03/07/2016 1124   CREATININE 1.24 03/07/2016 1124   CREATININE 1.26* 03/03/2016 1102   CALCIUM 8.4* 03/07/2016 1124   PROT 7.5 03/07/2016 1124   ALBUMIN 2.9* 03/07/2016 1124   AST 68* 03/07/2016 1124   ALT 53 03/07/2016 1124   ALKPHOS 107 03/07/2016 1124   BILITOT 1.7* 03/07/2016 1124   GFRNONAA >60 03/07/2016 1124   GFRNONAA 62 03/03/2016 1102   GFRAA >60 03/07/2016 1124   GFRAA 71 03/03/2016 1102    No results found for: SPEP, UPEP  Lab Results  Component Value Date   WBC 4.1 03/14/2016   NEUTROABS 2.9 03/14/2016   HGB 10.8* 03/14/2016   HCT 34.0* 03/14/2016   MCV 88.2 03/14/2016   PLT 66* 03/14/2016      Chemistry      Component Value Date/Time   NA 135 03/07/2016 1124   K 3.4* 03/07/2016 1124   CL 99* 03/07/2016 1124   CO2 27 03/07/2016 1124   BUN 18 03/07/2016 1124   CREATININE 1.24 03/07/2016 1124   CREATININE 1.26* 03/03/2016 1102      Component Value Date/Time   CALCIUM 8.4* 03/07/2016 1124   ALKPHOS 107 03/07/2016 1124   AST 68* 03/07/2016 1124   ALT 53 03/07/2016 1124   BILITOT 1.7* 03/07/2016 1124     ASSESSMENT & PLAN:  Thrombocytopenia 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 Due to stability of his platelet count, I will see him back in 6 months.  Psoriasis The psoriasis is stable but does not bother him. Monitor only    Blood loss anemia Overall, anemia is improving. He denies further GI bleed. Monitor closely.  Morbid obesity ([place]) The patient is morbidly obese with multiple risk factors for heart disease. Lost 30 pounds over the last 6 months. He is motivated to exercise and I recommend enrollment in the local [employer]   All questions were answered. The patient knows to call the clinic with any problems, questions or concerns. No barriers to learning was detected.  I spent 15 minutes counseling the patient face to face. The total time spent in the appointment was 20 minutes and more than 50% was on counseling.     Vincent Ochoa, Vincent Nodal, MD 4/24/20172:43 PM

## 2016-03-14 NOTE — Assessment & Plan Note (Signed)
Overall, anemia is improving. He denies further GI bleed. Monitor closely.

## 2016-03-14 NOTE — Assessment & Plan Note (Signed)
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 Due to stability of his platelet count, I will see him back in 6 months.

## 2016-03-14 NOTE — Assessment & Plan Note (Signed)
The psoriasis is stable but does not bother him. Monitor only 

## 2016-03-14 NOTE — Progress Notes (Signed)
Medical Nutrition Therapy:  Appt start time: 0900 end time:  1000. Tammy was accompanied by his wife Vincent Ochoa, who does most food prep, although Keyshun is comfortable cooking as well.  She works full-time, and said it will be difficult for her to make follow-up appts.  Linda admitted to consuming a lot of sweet drinks and snack foods herself when we talked of keeping healthier choices on hand and less healthy choices out of the house.    Assessment:  Primary concerns today: heart-healty diet.   Learning Readiness: Ready  Usual eating pattern includes 1-2 meals and 1-8 snacks per day.  (Quit smoking recently, and is snacking more.) Frequent foods and beverages include water, fruit drink, tea (sometimes black), bacon/sausage, eggs, hash browns, red meat, fruit, veg's, buttermilk.  Avoided foods include none.   Usual physical activity includes ~30(+) min walk - usually 3-4 X wk.  Walks less now that he has sold his dogs.    24-hr recall: (Up at 9:30 AM) B (11 AM)-   1 white toast, 1 c white sauce with sausage, 1 c orange juice  Snk ( AM)-   12 oz fruit drink L ( PM)-  water Snk (4:30)-  2 c baby carrots D (6 PM)-  3 c potatoes, onion, pepper, & Polish sausage, 12 oz Mtn Dew  Snk ( PM)-  --- Typical day? Yes.    Progress Towards Goal(s):  In progress.   Nutritional Diagnosis:  NI-1.5 Excessive energy intake As related to needs.  As evidenced by BMI >34.    Intervention:  Nutrition education.  Handouts given during visit include:  AVS  Demonstrated degree of understanding via:  Teach Back  Barriers to learning/adherence to lifestyle change: Longstanding poor diet.  Monitoring/Evaluation:  Dietary intake, exercise, and body weight in 4 week(s).

## 2016-03-14 NOTE — Assessment & Plan Note (Signed)
The patient is morbidly obese with multiple risk factors for heart disease. Lost 30 pounds over the last 6 months. He is motivated to exercise and I recommend enrollment in the local [employer]

## 2016-03-14 NOTE — Telephone Encounter (Signed)
Gave pt appt & avs °

## 2016-03-14 NOTE — Patient Instructions (Addendum)
Recommendations - Eat at least 3 REAL meals and 1-2 snacks per day.  Aim for no more than 5 hours between eating.  Eat breakfast within one hour of getting up.   - A REAL meal includes a source of protein, a starch, and vegetables.   - Use fresh or frozen vegetables with seasonings that usually do not include salt or sodium (garlic or onion POWDER, not onion SALT).    - Aim for vegetables at both lunch and dinner.   - Limit sweet drinks (sweet tea, juice drink, soda) to 12 oz per day.  Less is better.  (High-fructose corn syrup in large quantities promotes fatty liver.) - Keep on hand at all times those foods you'd like to eat more of (vegetables and fruit) and keep out of the house (or keep to a minimum) those you're trying to eat less of.    - Also make the healthiest foods CONVENIENT.    - AT FOLLOW-UP, WE'LL TALK ABOUT HOW TO LIMIT FATS IN YOUR DIET, AND WHICH ONES ARE THE BEST TO EAT.

## 2016-03-17 ENCOUNTER — Ambulatory Visit: Payer: Self-pay | Admitting: Family Medicine

## 2016-03-21 ENCOUNTER — Telehealth: Payer: Self-pay | Admitting: Family Medicine

## 2016-03-21 MED ORDER — FUROSEMIDE 80 MG PO TABS: 80.0000 mg | ORAL_TABLET | Freq: Two times a day (BID) | ORAL | Status: DC

## 2016-03-21 NOTE — Telephone Encounter (Signed)
Spoke with patient about his lasix. He needs a refill so one was sent. He has seen GI and will follow up with me next week.   Rosemarie Ax, MD PGY-3, [facility] 03/21/2016, 1:50 PM

## 2016-03-21 NOTE — Telephone Encounter (Signed)
Please contact [name] back regarding his furosemide rx.  Questions about the dosage.

## 2016-04-03 NOTE — Progress Notes (Signed)
Cardiology Office Note    Date:  04/04/2016   ID:  Vincent Ochoa, DOB 1956-01-14, MRN [id number]  PCP:  Vincent Coots, MD  Cardiologist:  Vincent Him, MD   Chief Complaint  Patient presents with  . Sleep Apnea    pt c/o cramping in both legs and groin area    History of Present Illness:  Vincent Ochoa is a 60 y.o. male who presents for followup of OSA. He has severe OSA with an AHI of 56 events per hour and is on CPAP titration at18cm H2O. He tolerates his CPAP well. He tolerates the full face mask and feels the pressure is adequate. He feels rested in the am and has less daytime sleepiness and does take a nap occasionally.  He does not think that he snores. He has some dryness in his mouth.      Past Medical History  Diagnosis Date  . GERD (gastroesophageal reflux disease)   . Psoriasis   . Frequent headaches   . Facial cellulitis 2014    Periorbital  . Coronary artery disease     a. Evaluated by Dr. Stanford Ochoa 2012 - nonobstructive 2007-2008 by cath. b. Abnormal stress test 10/2014 but cath deferred temporarily due to thrombocytopenia. c. 10/2014: readm with AF-RVR/NSTEMI - s/p BMS to mLCx 11/04/14 (mod dz in RCA);  d. relook cath 01/2015 and 06/02/2015 patent stent, nonobs CAD, EF 55-65%.  . Back pain, lumbosacral 2014  . Thrombocytopenia ([place])     a. Onset unclear, but noted 2012. ? Autoimmune per Vincent Ochoa with hematology, may be related to psoriasis. s/p prednisone, IVIG.  Marland Kitchen Hyperlipidemia   . Obesity   . Elevated LFTs   . Fatty liver US - 2012  . PAF (paroxysmal atrial fibrillation) ([place])     a. Episode 10/2014 at time of NSTEMI, spont converted to NSR. Observation for further episodes (not on anticoag at present time due to [facility] = 1, thrombocytopenia).  . Sinus bradycardia   . OSA (obstructive sleep apnea)     severe with AHI 56/hr with successful CPAP titration to 18cm H2O  . Morbid obesity [facility])     Past Surgical History  Procedure Laterality Date    . Tonsillectomy  1960's  . Cholecystectomy  2007  . Knee arthroplasty  1970's  . Facial cosmetic surgery  2014  . Cardiac catheterization    . Left heart catheterization with coronary angiogram N/A 11/04/2014    Procedure: LEFT HEART CATHETERIZATION WITH CORONARY ANGIOGRAM;  Surgeon: Vincent Booze, MD;  Location: [facility] CATH LAB;  Service: Cardiovascular;  Laterality: N/A;  . Left heart catheterization with coronary angiogram N/A 01/28/2015    Procedure: LEFT HEART CATHETERIZATION WITH CORONARY ANGIOGRAM;  Surgeon: Vincent Mocha, MD;  Location: [facility] CATH LAB;  Service: Cardiovascular;  Laterality: N/A;  . Cardiac catheterization N/A 06/02/2015    Procedure: Left Heart Cath and Coronary Angiography;  Surgeon: Vincent Booze, MD;  Location: [facility] CV LAB;  Service: Cardiovascular;  Laterality: N/A;  . Radiology with anesthesia N/A 02/04/2016    Procedure: RADIOLOGY WITH ANESTHESIA;  Surgeon: Vincent Radiologist, MD;  Location: [facility];  Service: Radiology;  Laterality: N/A;    Current Medications: Outpatient Prescriptions Prior to Visit  Vincent Sig Dispense Refill  . aspirin 81 MG tablet Take 1 tablet (81 mg total) by mouth daily. 30 tablet 1  . buPROPion (WELLBUTRIN XL) 150 MG 24 hr tablet Take 150 mg by mouth daily.    . cyclobenzaprine (FLEXERIL)  10 MG tablet Take 1 tablet (10 mg total) by mouth 3 (three) times daily as needed for muscle spasms. 90 tablet 0  . docusate sodium (COLACE) 100 MG capsule Take 1 capsule (100 mg total) by mouth 2 (two) times daily. 10 capsule 0  . furosemide (LASIX) 80 MG tablet Take 1 tablet (80 mg total) by mouth 2 (two) times daily. 120 tablet 0  . isosorbide mononitrate (IMDUR) 30 MG 24 hr tablet Take 1 tablet (30 mg total) by mouth daily. 30 tablet 1  . Lactulose 20 GM/30ML SOLN Take 45 mLs (30 g total) by mouth 2 (two) times daily. 473 mL 3  . nitroGLYCERIN (NITROSTAT) 0.4 MG SL tablet Place 1 tablet (0.4 mg total) under the tongue every 5  (five) minutes as needed for chest pain (up to 3 doses). 25 tablet 3  . pantoprazole (PROTONIX) 40 MG tablet Take 1 tablet (40 mg total) by mouth 2 (two) times daily. 60 tablet 2  . rifaximin (XIFAXAN) 550 MG TABS tablet Take 1 tablet (550 mg total) by mouth 2 (two) times daily. 60 tablet 0  . spironolactone (ALDACTONE) 100 MG tablet Take 2 tablets (200 mg total) by mouth daily. 60 tablet 0   No facility-administered medications prior to visit.     Allergies:   Review of patient's allergies indicates no known allergies.   Social History   Social History  . Marital Status: Married    Spouse Name: N/A  . Number of Children: N/A  . Years of Education: N/A   Social History Main Topics  . Smoking status: Former Smoker -- 0.20 packs/day for 43 years    Types: Cigarettes  . Smokeless tobacco: Never Used     Comment: 11/28/2012 "chew occasionally"  Quit for 2.5 months in early 2016  . Alcohol Use: 0.0 oz/week    0 Standard drinks or equivalent per week     Comment: 11/28/2012 "beer q 6 months or so"  . Drug Use: No  . Sexual Activity: No   Other Topics Concern  . None   Social History Narrative   Lives in [place].    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      Family History:  The patient's family history includes Arthritis in his maternal grandmother; Cancer - Ovarian in his sister; Cirrhosis in his mother; Diabetes in his sister; Heart attack in his maternal aunt and mother; Heart disease in his sister; Hypertension in his father and mother; Stroke in his father.   ROS:   Please see the history of present illness.    ROS All other systems reviewed and are negative.   PHYSICAL EXAM:   VS:  BP 100/60 mmHg  Pulse 84  Ht 6\' 3"  (1.905 m)  Wt 272 lb (123.378 kg)  BMI 34.00 kg/m2  SpO2 96%   GEN: Well nourished, well developed, in no acute distress HEENT: normal Neck: no JVD, carotid bruits, or  masses Cardiac: RRR; no murmurs, rubs, or gallops,no edema.  Intact distal pulses bilaterally.  Respiratory:  clear to auscultation bilaterally, normal work of breathing GI: soft, nontender, nondistended, + BS MS: no deformity or atrophy Skin: warm and dry, no rash Neuro:  Alert and Oriented x 3, Strength and sensation are intact Psych: euthymic mood, full affect  Wt Readings from Last 3 Encounters:  04/04/16 272 lb (123.378 kg)  03/14/16 267 lb (121.11  kg)  03/14/16 275 lb 3.2 oz (124.83 kg)      Studies/Labs Reviewed:   EKG:  EKG is not ordered today.    Recent Labs: 02/17/2016: B Natriuretic Peptide 18.0 03/03/2016: Magnesium 1.6 03/07/2016: ALT 53; BUN 18; Creatinine, Ser 1.24; Potassium 3.4*; Sodium 135 03/14/2016: HGB 10.8*; Platelets 66*   Lipid Panel    Component Value Date/Time   CHOL 157 06/02/2015 0635   TRIG 83 02/08/2016 0126   HDL 27* 06/02/2015 0635   CHOLHDL 5.8 06/02/2015 0635   VLDL 15 06/02/2015 0635   LDLCALC 115* 06/02/2015 [id number]    Additional studies/ records that were reviewed today include:  CPAP d/l    ASSESSMENT:    1. OSA (obstructive sleep apnea)   2. Obesity      PLAN:  In order of problems listed above:  1.  OSA - the patient is tolerating PAP therapy well without any problems. The PAP download was reviewed today and showed an AHI of 2.7/hr on 18 cm H2O with 70% compliance in using more than 4 hours nightly.  The patient has been using and benefiting from CPAP use and will continue to benefit from therapy. I have also instructed the patient on proper sleep hygiene, avoidance of sleeping in the supine position and avoidance of alcohol within 4 hours of bedtime.  The patient was also instructed to avoid driving if sleepy.   2.   Obesity - I have encouraged Ochoa to get into a routine exercise program and cut back on carbs and portions.     Vincent Adjustments/Labs and Tests Ordered: Current medicines are reviewed at length with the  patient today.  Concerns regarding medicines are outlined above.  Vincent changes, Labs and Tests ordered today are listed in the Patient Instructions below.  There are no Patient Instructions on file for this visit.   Signed, Vincent Him, MD  04/04/2016 10:14 AM    [address], [place], [place]  [address] Phone: [phone]; Fax: [phone]

## 2016-04-04 ENCOUNTER — Ambulatory Visit (INDEPENDENT_AMBULATORY_CARE_PROVIDER_SITE_OTHER): Payer: Medicaid Other | Admitting: Cardiology

## 2016-04-04 ENCOUNTER — Encounter: Payer: Self-pay | Admitting: Cardiology

## 2016-04-04 VITALS — BP 100/60 | HR 84 | Ht 75.0 in | Wt 272.0 lb

## 2016-04-04 DIAGNOSIS — E669 Obesity, unspecified: Secondary | ICD-10-CM | POA: Diagnosis not present

## 2016-04-04 DIAGNOSIS — G4733 Obstructive sleep apnea (adult) (pediatric): Secondary | ICD-10-CM | POA: Diagnosis not present

## 2016-04-04 NOTE — Patient Instructions (Signed)

## 2016-04-11 ENCOUNTER — Encounter: Payer: Self-pay | Admitting: Cardiology

## 2016-04-11 ENCOUNTER — Other Ambulatory Visit: Payer: Self-pay | Admitting: Family Medicine

## 2016-04-14 ENCOUNTER — Ambulatory Visit (INDEPENDENT_AMBULATORY_CARE_PROVIDER_SITE_OTHER): Payer: Medicaid Other | Admitting: Family Medicine

## 2016-04-14 ENCOUNTER — Encounter: Payer: Self-pay | Admitting: Licensed Clinical Social Worker

## 2016-04-14 VITALS — Ht 75.0 in | Wt 272.8 lb

## 2016-04-14 DIAGNOSIS — F329 Major depressive disorder, single episode, unspecified: Secondary | ICD-10-CM

## 2016-04-14 DIAGNOSIS — E669 Obesity, unspecified: Secondary | ICD-10-CM

## 2016-04-14 DIAGNOSIS — I25119 Atherosclerotic heart disease of native coronary artery with unspecified angina pectoris: Secondary | ICD-10-CM | POA: Diagnosis not present

## 2016-04-14 DIAGNOSIS — K7460 Unspecified cirrhosis of liver: Secondary | ICD-10-CM

## 2016-04-14 DIAGNOSIS — F32A Depression, unspecified: Secondary | ICD-10-CM

## 2016-04-14 DIAGNOSIS — R188 Other ascites: Secondary | ICD-10-CM

## 2016-04-14 NOTE — Progress Notes (Signed)
Patient ID: Vincent Ochoa, male   DOB: 1956-04-11, 60 y.o.   MRN: [id number]   Patient referred to [facility] by Dr. Jenne Campus for life stress.    Presenting Issue: Patient lives with his wife of 9 years and three of her family members. 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 Patient has a history of depression and in-patient psych hospitalization in 20-Mar-1987 after the death of his daughter, mother and father in a car accident.  Current stressors include not being able to work, extended family living with him that are not working and using up his limited resources.  Doing all of the house work as well as cooking. Report of symptoms: Patient states feels down, depressed for the past year with no improvement. Does not want to fish and play golf.  Patient states he use to do these once a week. States no other symptoms.  Patient denies smoking, alcohol, drugs, SI, and HI.  No AH or VH.  Assessment Patient is tearful during assessment.  He is experiencing depression and having difficulty coping as a result of social stressors and adjustment to his new normal. Patient would benefit from brief therapeutic interventions regarding developing coping skill for stressors and with symptoms of depression.  Patient is in agreement to meet with INT to address symptoms and establish goals.  Plan/Recommendations:   Patient's treatment plan was discussed and goals were established.  Interventions will include a combination of Motivational Interviewing and Cognitive Behavioral therapy.  Treatment today focused on establishing a therapeutic relationship, identifying presenting problem, and establishing goals.   Patient is in agreement to return to [facility] clinic to work on established goals Patient's Goals: . Feel better and find his Happy Place . Develop coping skills . Manage social stressors  Casimer Lanius. [place] [employer],  [phone] 12:12 PM

## 2016-04-14 NOTE — Patient Instructions (Addendum)
Recommendations - Eat at least 3 REAL meals and 1-2 snacks per day. Aim for no more than 5 hours between eating. Eat breakfast within one hour of getting up.  - A REAL meal includes a source of protein, a starch, and   vegetables.  - Aim for vegetables at both lunch and dinner.   - Check fasting blood sugars 2-3 times a week before eating breakfast   - Use fresh or frozen vegetables with seasonings that usually do not include salt or sodium (garlic or onion POWDER, not onion SALT).    - Also make the healthiest foods CONVENIENT.   WE WILL TALK ABOUT FASTING AT YOUR NEXT VISIT!!

## 2016-04-14 NOTE — Progress Notes (Signed)
Medical Nutrition Therapy:  Appt start time: 0900 end time:  1000.  Assessment:  Primary concerns today: heart-healty diet.   Learning Readiness: Ready  Usual eating pattern includes 2-3 meals and 1-8 snacks per day.  (Quit smoking 2-3 months ago, and is snacking more.) On waiting list for [employer]. Wife has lost 10lbs.  Frequent foods and beverages include water, fruit drink, tea (sometimes black), bacon/sausage, eggs, hash browns, red meat, fruit, veg's, buttermilk.  Avoided foods include none.   Usual physical activity includes ~1.5 hours walk - 6-7 X wk.  Hits golf balls and then walks to get them - He is having a lot stress in his life. Mostly around his brother in law and step son neither of which contribute money to the house hold or help out. This is in conjunction to financial difficulties, and not being able to talk to his wife about her family's burden. He has thoughts of getting in his truck and just driving away, but hasn't because he loves his wife. However, he admits to feeling more distance from her and not wanting to sleep in the same bed with her anymore.   24-hr recall: (Up at 6am AM) B (730 AM)-   Toast, decaf coffee, water, boiled egg and [place] sausage  Snk  (Noon)-    Peanut butter crackers pack; water, carrots L ( Noon)-  Apples, tea, crackers, orange, tangerine Snk (4:30)-   D (7 PM)-  Pork chop, stuffed (cornmill stuffing); green beans, cream potatoes, reg Coke (12oz)   Snk ( PM)-  --- Typical day? Yes.    Progress Towards Goal(s):  In progress.   Nutritional Diagnosis:  NI-1.5 Excessive energy intake As related to needs.  As evidenced by BMI >34.    Intervention:  Nutrition education.  Handouts given during visit include:  AVS  Demonstrated degree of understanding via:  Teach Back  Barriers to learning/adherence to lifestyle change: Longstanding poor diet.  Monitoring/Evaluation:  Dietary intake, exercise, and body weight in 4 week(s).  Greater than 50  minutes spent with patient and greater than 50% of that time was one-on-one counseling.

## 2016-04-20 ENCOUNTER — Telehealth: Payer: Self-pay | Admitting: Family Medicine

## 2016-04-20 MED ORDER — SPIRONOLACTONE 100 MG PO TABS: 200.0000 mg | ORAL_TABLET | Freq: Every day | ORAL | Status: DC

## 2016-04-20 NOTE — Telephone Encounter (Signed)
Pt called and needs a refill on his Spironolactone called in. jw

## 2016-04-21 ENCOUNTER — Ambulatory Visit (INDEPENDENT_AMBULATORY_CARE_PROVIDER_SITE_OTHER): Payer: Medicaid Other | Admitting: Licensed Clinical Social Worker

## 2016-04-21 DIAGNOSIS — F32A Depression, unspecified: Secondary | ICD-10-CM

## 2016-04-21 DIAGNOSIS — R4589 Other symptoms and signs involving emotional state: Secondary | ICD-10-CM

## 2016-04-21 DIAGNOSIS — F329 Major depressive disorder, single episode, unspecified: Secondary | ICD-10-CM

## 2016-04-21 NOTE — Progress Notes (Signed)
Patient ID: LEJEND DICECCO, male   DOB: 1956-04-15, 60 y.o.   MRN: [id number]   Reason for follow-up: Brief therapy to address social stressors, and depression. Depression screen [facility] 2/9 04/21/2016 03/03/2016 02/23/2016  Decreased Interest 1 0 0  Down, Depressed, Hopeless 1 0 0  PHQ - 2 Score 2 0 0  Altered sleeping 1 - -  Tired, decreased energy 2 - -  Change in appetite 2 - -  Feeling bad or failure about yourself  2 - -  Trouble concentrating 1 - -  Moving slowly or fidgety/restless 1 - -  Suicidal thoughts 0 - -  PHQ-9 Score 11 - -  Difficult doing work/chores Somewhat difficult - -   PHQ-9 score of 11 is -Minor depression  Issues discussed:  Patient is pleasant and engaged during session, tearful at times. He continues to express concerns with his wife, extended family and financial stressors. Reviewed and adjusted goals.  Motivational Interviewing and psychoeducation utilized in this session.  The following was discussed: ambivalence in several areas, social stressors, coping skills, self-care, relaxation methods, healthy eating and identifying things that help patient relax. Demonstrated relaxed breathing (also provided patient with a worksheet).  For homework patient will practice relaxed breathing, follow up on information given for healthy eating( free produce), and go fishing for relaxation.  Goals  Start Self-Care Develop coping skills for social stressors Establishing his new normal Implement relaxation  Find "happy place" Patient is in agreement to return to INT clinic to continue working on goals to assist with reducing stress and systems of depression.  Casimer Lanius. [place] [facility],  [phone] 12:16 PM

## 2016-04-26 ENCOUNTER — Ambulatory Visit (INDEPENDENT_AMBULATORY_CARE_PROVIDER_SITE_OTHER): Payer: Medicaid Other | Admitting: Family Medicine

## 2016-04-26 ENCOUNTER — Encounter: Payer: Self-pay | Admitting: Family Medicine

## 2016-04-26 VITALS — BP 120/63 | HR 97 | Temp 98.4°F | Ht 75.0 in | Wt 276.6 lb

## 2016-04-26 DIAGNOSIS — F32A Depression, unspecified: Secondary | ICD-10-CM

## 2016-04-26 DIAGNOSIS — F329 Major depressive disorder, single episode, unspecified: Secondary | ICD-10-CM

## 2016-04-26 NOTE — Progress Notes (Signed)
   Subjective:    Vincent Ochoa - 60 y.o. male MRN [id number]  Date of birth: Dec 25, 1955  CC depression   HPI  Vincent Ochoa is here for depression.   Presenting Issue: anhedonia, lack of interest,   Report of symptoms: he reports he has felt this way for some time but has always been able to cope with it. Now that he has several medical problems going on and not being able to work, he is not able to cope on his own. He feel the Wellbutrin is causing him to become aggressive with no provocation.   Duration of CURRENT symptoms: acute on chronic  Age of onset of first mood disturbance: unclear   Impact on function: he reports outburst recently that he attributes to his Wellbutrin use. This is affecting his relationship with his nephew.    Psychiatric History - Diagnoses: none  - Hospitalizations:  none - Pharmacotherapy: wellbutrin for smoking cessation  - Outpatient therapy: currently undergoing care with integrated care   Family history of psychiatric issues: his sister would hoard food. Possible for OCD  Current and history of substance use: distant history of alcohol use and recently stopped smoking.   SH: quit smoking since being admitted in February  PMH: dCHF, bleeding varices, CAD, Thrombocytopenia, morbid obesity, Psoriasis, HLD, OSA, Cirrhosis   Health Maintenance:  Health Maintenance Due  Topic Date Due  . COLONOSCOPY  02/27/2006  . ZOSTAVAX  02/28/2016    Review of Systems See HPI     Objective:   Physical Exam BP 120/63 mmHg  Pulse 97  Temp(Src) 98.4 F (36.9 C) (Oral)  Ht 6\' 3"  (1.905 m)  Wt 276 lb 9.6 oz (125.465 kg)  BMI 34.57 kg/m2 Gen: NAD, alert, cooperative with exam,  Neuro: no gross deficits.     Assessment & Plan:   Depression Reports several stressors in his life as of late.  Having mood changes that he is relating to wellbutrin.  Has been meeting with integrated care in order to establish coping mechanisms and goals  Had the  discussion today about starting an SSRI such as zoloft.  He would like to try this medication if it has the option of making him feel better.  Will stop the wellbutrin  - consider starting zoloft in the future    Greater than 30 minutes was spent face to face with more 50% of the time spent counseling.

## 2016-04-26 NOTE — Patient Instructions (Signed)
Thank you for coming in,   We could consider starting a medication called zoloft.   I will give you a call before we would start it.   Please follow up with me in 1-2 weeks once it is started.   Please bring all of your medications with you to each visit.   Health maintenance items that are due.  Health Maintenance  Topic Date Due  . Colon Cancer Screening  02/27/2006  . Shingles Vaccine  02/28/2016  . Flu Shot  06/21/2016  . Tetanus Vaccine  02/22/2023  .  Hepatitis C: One time screening is recommended by Center for Disease Control  (CDC) for  adults born from [birth year] through 1965.   Completed  . HIV Screening  Completed     Sign up for My Chart to have easy access to your labs results, and communication with your Primary care physician   Please feel free to call with any questions or concerns at any time, at [phone]. --Dr. Raeford Razor

## 2016-04-27 NOTE — Assessment & Plan Note (Addendum)
Reports several stressors in his life as of late.  Having mood changes that he is relating to wellbutrin.  Has been meeting with integrated care in order to establish coping mechanisms and goals  Had the discussion today about starting an SSRI such as zoloft.  He would like to try this medication if it has the option of making him feel better.  Will stop the wellbutrin  - consider starting zoloft in the future

## 2016-04-28 ENCOUNTER — Telehealth: Payer: Self-pay | Admitting: Family Medicine

## 2016-04-28 MED ORDER — SERTRALINE HCL 50 MG PO TABS: 50.0000 mg | ORAL_TABLET | Freq: Every day | ORAL | Status: DC

## 2016-04-28 NOTE — Telephone Encounter (Signed)
Spoke with patient and his wife about his ongoing mood. He doesn't get much sleep at night and gets up periodically. He will take a nap throughout the day. His wife has encouraged him to go fishing or play golf, which he will, but he mood go back down when he comes back home. His mood has changed since his recent illnesses and hospitalizations. He has always been "high strung" but even more so with his anxiety.   Discussed about starting zoloft and counseled on its effects, when the effects are noticed, follow up and length of treatment.   Rosemarie Ax, MD PGY-3, [facility] 04/28/2016, 3:37 PM

## 2016-05-04 ENCOUNTER — Ambulatory Visit (INDEPENDENT_AMBULATORY_CARE_PROVIDER_SITE_OTHER): Payer: Medicaid Other | Admitting: Licensed Clinical Social Worker

## 2016-05-04 DIAGNOSIS — F329 Major depressive disorder, single episode, unspecified: Secondary | ICD-10-CM

## 2016-05-04 DIAGNOSIS — F32A Depression, unspecified: Secondary | ICD-10-CM

## 2016-05-04 NOTE — Progress Notes (Signed)
Patient ID: Vincent Ochoa, male   DOB: 03-15-1956, 60 y.o.   MRN: [id number]  Reason for follow-up: Continue intervention to address psychosocial stressors and depression.  Depression screen [facility] 2/9 05/04/2016 04/26/2016 04/21/2016  Decreased Interest 1 0 1  Down, Depressed, Hopeless 1 1 1   PHQ - 2 Score 2 1 2   Altered sleeping 1 - 1  Tired, decreased energy 3 - 2  Change in appetite 1 - 2  Feeling bad or failure about yourself  3 - 2  Trouble concentrating 2 - 1  Moving slowly or fidgety/restless 1 - 1  Suicidal thoughts 0 - 0  PHQ-9 Score 13 - 11  Difficult doing work/chores Somewhat difficult - Somewhat difficult  PHQ score has increased since last visit with patient.  Patient is pleasant and lethargic today.  Reports started taking Zoloft Sat. and noticed he has less energy.  Other changes during the weekend include his dog of several years passed away. Patient went fishing as part of his self-care and obtained free vegetables from resource provided for healthy eating.  Interventions utilized today: Motivational Interviewing, psycho education and Veterinary surgeon. Issues discussed:  Patient's goals were reviewed, no adjustment made.   The following issues were discussed: . Reduce Isolation . fewer arguments at home . "feeling stuck" and not able to move past depressed feelings . Financial stressors . Family getting away for vacation . Grief with dog passing   Plan: Patient is in agreement to work on the following prior to next session: Reduce Isolation, Spend at least two hours a day around other people. Think of things he enjoyed doing and try to do one thing a day. Continue taking psychotropic medication as prescribed by MD and  follow up appointment with INT clinic in two weeks.   Casimer Lanius. LCSW Clinical Social Work, [place]   [phone] 12:10 PM

## 2016-05-12 ENCOUNTER — Encounter: Payer: Self-pay | Admitting: Family Medicine

## 2016-05-12 ENCOUNTER — Ambulatory Visit (INDEPENDENT_AMBULATORY_CARE_PROVIDER_SITE_OTHER): Payer: Medicaid Other | Admitting: Family Medicine

## 2016-05-12 VITALS — Ht 75.0 in | Wt 283.8 lb

## 2016-05-12 DIAGNOSIS — I25119 Atherosclerotic heart disease of native coronary artery with unspecified angina pectoris: Secondary | ICD-10-CM

## 2016-05-12 DIAGNOSIS — H01116 Allergic dermatitis of left eye, unspecified eyelid: Secondary | ICD-10-CM

## 2016-05-12 NOTE — Patient Instructions (Addendum)
For your Left eye swelling and itching. Recommend to use Benadryl as needed for itching and swelling, you can use ice packs, may try anti-histamine or allergy eye drops if itching. It should continue to get better over next few days. If worsening or redness, pain, or vision changes, return sooner for re-evaluation.  Goals: 1. Continue 3 regular meals daily with protein, fresh vegetables. Good work on this. 2. Start regular exercise schedule everyday - try 7 days a week. Remember 5 minute exercise rule. 3. If raining, exercise at home indoors on treadmill / stationary bike  Try to arrange exercise program at [facility]  Week of __________ Goals SUN MON TUE WED THU FRI SAT  Eat 3 regular meals         Exercise daily at least 5 min         Exercise indoor even if rains          Week of __________ Goals SUN MON TUE WED THU FRI SAT  Eat 3 regular meals         Exercise daily at least 5 min         Exercise indoor even if rains          Week of __________ Goals SUN MON TUE WED THU FRI SAT  Eat 3 regular meals         Exercise daily at least 5 min         Exercise indoor even if rains          Week of __________ Goals SUN MON TUE WED THU FRI SAT  Eat 3 regular meals         Exercise daily at least 5 min         Exercise indoor even if rains          Week of __________ Goals SUN MON TUE WED THU FRI SAT  Eat 3 regular meals         Exercise daily at least 5 min         Exercise indoor even if rains          Week of __________ Goals SUN MON TUE WED THU FRI SAT  Eat 3 regular meals         Exercise daily at least 5 min         Exercise indoor even if rains

## 2016-05-12 NOTE — Progress Notes (Signed)
NUTRITION VISIT:  Learning Readiness:   Change in progress  Usual physical activity includes walking (including walking his dog), treadmill/stationary bike - not regular schedule infrequent recently < 1-2 times weekly. No exercise due to rain recently.  Trying to work on last goal of eating regular meals. Now eats 3 meals a day, reduced portions, some fruits, apple banana carrots snacks between. No canned foods. Wife helps with weight loss, fixes plates, uses specific 4-6 oz serving size scoops  24-hr recall: (Up at  0700 AM) B (0730 AM)- 2 boiled eggs and 1 piece toast (italian bread), 4 oz glass tomato juice, 1 cup decaf coffee (no sugar)  L (1230 - 1300 PM)- out to [employer] with friend. Ate steak, vegetables, half plate chopped steak / vegetables/peppers/onions/mushroom (took half home), half plate beans / rice, iced tea (unsweetened)   Snk (1400 PM)- 1 [place] diet 12 oz   D (1830 PM)- Pork chop (center cut), fresh green beans, squash (out of garden) Boiled cabbage, iced tea (unsweetened) Typical day? Yes.    Demonstrated degree of understanding via:  Teach Back  Barriers to learning/adherence to lifestyle change: Depression (recently started Zoloft) and thinks that this has opposite effect with inc appetite and weight gain, PTSD ([place] veteran)  Review of Previous Goals: 1. Eat 3 "real" meals daily (balanced with protein, vegetables etc) - Seems to be meeting this goal most days  Goal wt down to 210 lbs.  Monitoring/Evaluation:  Dietary intake, exercise, and body weight in 1 month(s).  Additional HPI:  Left eyelid itching / swelling Reported 2 days ago while mowing lawn he felt some "grass shavings" irritate his left eye, he has been scratching and rubbing it, with some itching, slightly red irritated eye, tearing, and left upper eyelid swelling. No other symptoms, gradually improving but not resolved. - Not tried any medications or eye drops - Denies eye pain, skin  redness, eye drainage, fevers/chills, other rash or swelling, difficulty breathing  Exam: Gen - obese, well appearing HEENT - Left Eye slightly injected conjunctiva with some tearing, left upper eyelid mild soft non-erythematous edema non-tender, no eye drainage, full EOMI without eye pain Skin - warm dry, no rash or hives  A&P Contact allergy Left eye/eyelid, likely 2/2 to outdoor grass exposure - Recommend trial of Benadryl for 1-2 days, anti-histamine eye drops, ice packs - Return criteria given, follow-up if worsening or new symptoms  Nobie Putnam, [place], PGY-3

## 2016-05-26 ENCOUNTER — Ambulatory Visit: Payer: Medicaid Other

## 2016-05-27 ENCOUNTER — Ambulatory Visit: Payer: Medicaid Other | Admitting: Family Medicine

## 2016-06-02 ENCOUNTER — Ambulatory Visit (INDEPENDENT_AMBULATORY_CARE_PROVIDER_SITE_OTHER): Payer: Medicaid Other | Admitting: Licensed Clinical Social Worker

## 2016-06-02 DIAGNOSIS — R4589 Other symptoms and signs involving emotional state: Secondary | ICD-10-CM

## 2016-06-02 DIAGNOSIS — F329 Major depressive disorder, single episode, unspecified: Secondary | ICD-10-CM

## 2016-06-02 NOTE — Progress Notes (Signed)
Patient ID: Vincent Ochoa, male   DOB: 03-May-1956, 60 y.o.   MRN: [id number]  Reason for follow-up: interventions to address symptoms of depression and psychosocial stressors PHQ score continues to decrease,  GAD is 14, an indication of anxiety  Depression screen [facility] 2/9 06/02/2016 05/04/2016 04/26/2016  Decreased Interest 0 1 0  Down, Depressed, Hopeless 1 1 1   PHQ - 2 Score 1 2 1   Altered sleeping 1 1 -  Tired, decreased energy 1 3 -  Change in appetite 0 1 -  Feeling bad or failure about yourself  3 3 -  Trouble concentrating 1 2 -  Moving slowly or fidgety/restless 2 1 -  Suicidal thoughts 0 0 -  PHQ-9 Score 9 13 -  Difficult doing work/chores Somewhat difficult Somewhat difficult -   GAD 7 : Generalized Anxiety Score 06/02/2016  Nervous, Anxious, on Edge 2  Control/stop worrying 3  Worry too much - different things 3  Trouble relaxing 3  Restless 3  Easily annoyed or irritable 0  Afraid - awful might happen 0  Total GAD 7 Score 14  Anxiety Difficulty Somewhat difficult   Patient is pleasant and calm today, states overall he feels better, but continue to worry about many things.  Reports he has stopped taking the Zoloft as he did not like way it made him feel. Patient's goals were reviewed, no adjustment made.  Patient states on a scale of 1 to 10 he is at 8 when think back on his depression when he started coming to INT clinic. States he feels like he is close to his happy place. Interventions utilized today: Motivational Interviewing and solution focus.   The following issues were discussed: . His vacation . Reduced isolation  . Continues to get vegetables weekly . Mowing his yard, cooking and playing with dog . Improved communication with wife . Self-care . His new normal . Quality time with wife Plan: will continue with self-care, relaxation and working on coping skills to manage his stressors.  He will return to INT clinic in three weeks.   Casimer Lanius, LCSW Licensed  Clinical Social Worker [place]   [phone] 4:48 PM

## 2016-06-03 ENCOUNTER — Encounter: Payer: Self-pay | Admitting: Family Medicine

## 2016-06-03 ENCOUNTER — Ambulatory Visit (INDEPENDENT_AMBULATORY_CARE_PROVIDER_SITE_OTHER): Payer: Medicaid Other | Admitting: Family Medicine

## 2016-06-03 VITALS — BP 138/62 | HR 90 | Temp 98.6°F | Ht 75.0 in | Wt 279.0 lb

## 2016-06-03 DIAGNOSIS — N529 Male erectile dysfunction, unspecified: Secondary | ICD-10-CM

## 2016-06-03 MED ORDER — ISOSORBIDE MONONITRATE ER 30 MG PO TB24: 30.0000 mg | ORAL_TABLET | Freq: Every day | ORAL | Status: DC

## 2016-06-03 MED ORDER — BUPROPION HCL ER (XL) 150 MG PO TB24: 150.0000 mg | ORAL_TABLET | Freq: Every day | ORAL | Status: DC

## 2016-06-03 MED ORDER — FUROSEMIDE 80 MG PO TABS: 80.0000 mg | ORAL_TABLET | Freq: Two times a day (BID) | ORAL | Status: DC

## 2016-06-03 MED ORDER — ASPIRIN 81 MG PO TABS: 81.0000 mg | ORAL_TABLET | Freq: Every day | ORAL | Status: DC

## 2016-06-03 MED ORDER — PANTOPRAZOLE SODIUM 40 MG PO TBEC: 40.0000 mg | DELAYED_RELEASE_TABLET | Freq: Two times a day (BID) | ORAL | Status: DC

## 2016-06-03 MED ORDER — SPIRONOLACTONE 100 MG PO TABS: 200.0000 mg | ORAL_TABLET | Freq: Every day | ORAL | Status: DC

## 2016-06-03 MED ORDER — PE-SHARK LIVER OIL-COCOA BUTTR 0.25-3-85.5 % RE SUPP: 1.0000 | RECTAL | Status: DC | PRN

## 2016-06-03 NOTE — Patient Instructions (Signed)

## 2016-06-03 NOTE — Progress Notes (Signed)
   HPI  CC: general health concerns, particularly erectile dysfunction.   Appt to meet new PCP. Reports concerns with seeing floaters recently, but has eye appt this Tuesday.   Erectile dysfunction Most important concern to patient was new erectile dysfunction. This began about 1 month ago. Was able to achieve an erection during approximately monthly intercourse with his wife, but was not able to maintain it. Felt aroused, and is disappointed that this happened. Was told by cardiology that he could not take Viagra. Counseled patient that if he could have cardiology discontinue his imdur, he could take the Viagra. Also counseled that his spironolactone, OSA, or sertraline could be contributing to this problem.   CC, SH/smoking status, and VS noted  Objective: BP 138/62 mmHg  Pulse 90  Temp(Src) 98.6 F (37 C) (Oral)  Ht 6\' 3"  (1.905 m)  Wt 279 lb (126.554 kg)  BMI 34.87 kg/m2 Gen: NAD, alert, cooperative, and pleasant. CV: RRR, no murmur Resp: CTAB, no wheezes, non-labored Neuro: Alert and oriented, Speech clear, No gross deficits  Assessment and plan:  Erectile dysfunction - New problem 05/2016. Cannot take viagra while on imdur. Recommended f/u with cardiology to ask about d/c'ing imdur and starting viagra. F/u 1 month   Ralene Ok, MD, PGY1 06/03/2016 4:34 PM

## 2016-06-09 ENCOUNTER — Ambulatory Visit: Payer: Medicaid Other | Admitting: Family Medicine

## 2016-06-20 ENCOUNTER — Other Ambulatory Visit: Payer: Self-pay

## 2016-06-20 ENCOUNTER — Encounter (HOSPITAL_COMMUNITY): Payer: Self-pay

## 2016-06-20 ENCOUNTER — Emergency Department (HOSPITAL_COMMUNITY): Payer: Medicaid Other

## 2016-06-20 ENCOUNTER — Ambulatory Visit (HOSPITAL_COMMUNITY): Admit: 2016-06-20 | Discharge: 2016-06-20 | Disposition: A | Discharge status: Home / Self Care | Payer: Medicaid Other

## 2016-06-20 ENCOUNTER — Ambulatory Visit (INDEPENDENT_AMBULATORY_CARE_PROVIDER_SITE_OTHER): Payer: Medicaid Other | Admitting: Internal Medicine

## 2016-06-20 ENCOUNTER — Ambulatory Visit (HOSPITAL_COMMUNITY): Payer: Medicaid Other

## 2016-06-20 ENCOUNTER — Inpatient Hospital Stay (HOSPITAL_COMMUNITY)
Admission: EM | Admit: 2016-06-20 | Discharge: 2016-06-23 | DRG: 368 | Disposition: A | Discharge status: Home / Self Care | Payer: Medicaid Other | Attending: Family Medicine | Admitting: Family Medicine

## 2016-06-20 VITALS — BP 124/73 | HR 97 | Temp 98.6°F | Wt 277.2 lb

## 2016-06-20 DIAGNOSIS — R109 Unspecified abdominal pain: Secondary | ICD-10-CM | POA: Diagnosis present

## 2016-06-20 DIAGNOSIS — I8501 Esophageal varices with bleeding: Principal | ICD-10-CM | POA: Diagnosis present

## 2016-06-20 DIAGNOSIS — K766 Portal hypertension: Secondary | ICD-10-CM | POA: Diagnosis present

## 2016-06-20 DIAGNOSIS — I5042 Chronic combined systolic (congestive) and diastolic (congestive) heart failure: Secondary | ICD-10-CM | POA: Diagnosis present

## 2016-06-20 DIAGNOSIS — I81 Portal vein thrombosis: Secondary | ICD-10-CM | POA: Diagnosis present

## 2016-06-20 DIAGNOSIS — R195 Other fecal abnormalities: Secondary | ICD-10-CM | POA: Diagnosis not present

## 2016-06-20 DIAGNOSIS — I110 Hypertensive heart disease with heart failure: Secondary | ICD-10-CM | POA: Diagnosis present

## 2016-06-20 DIAGNOSIS — I2510 Atherosclerotic heart disease of native coronary artery without angina pectoris: Secondary | ICD-10-CM | POA: Diagnosis present

## 2016-06-20 DIAGNOSIS — Z6833 Body mass index (BMI) 33.0-33.9, adult: Secondary | ICD-10-CM

## 2016-06-20 DIAGNOSIS — Z66 Do not resuscitate: Secondary | ICD-10-CM | POA: Diagnosis present

## 2016-06-20 DIAGNOSIS — E662 Morbid (severe) obesity with alveolar hypoventilation: Secondary | ICD-10-CM | POA: Diagnosis present

## 2016-06-20 DIAGNOSIS — R161 Splenomegaly, not elsewhere classified: Secondary | ICD-10-CM | POA: Diagnosis present

## 2016-06-20 DIAGNOSIS — R0789 Other chest pain: Secondary | ICD-10-CM | POA: Diagnosis not present

## 2016-06-20 DIAGNOSIS — Z7982 Long term (current) use of aspirin: Secondary | ICD-10-CM

## 2016-06-20 DIAGNOSIS — K7031 Alcoholic cirrhosis of liver with ascites: Secondary | ICD-10-CM | POA: Diagnosis present

## 2016-06-20 DIAGNOSIS — K920 Hematemesis: Secondary | ICD-10-CM | POA: Diagnosis present

## 2016-06-20 DIAGNOSIS — K3189 Other diseases of stomach and duodenum: Secondary | ICD-10-CM | POA: Diagnosis present

## 2016-06-20 DIAGNOSIS — K219 Gastro-esophageal reflux disease without esophagitis: Secondary | ICD-10-CM | POA: Diagnosis present

## 2016-06-20 DIAGNOSIS — R1012 Left upper quadrant pain: Secondary | ICD-10-CM

## 2016-06-20 DIAGNOSIS — I480 Paroxysmal atrial fibrillation: Secondary | ICD-10-CM | POA: Diagnosis present

## 2016-06-20 DIAGNOSIS — F329 Major depressive disorder, single episode, unspecified: Secondary | ICD-10-CM | POA: Diagnosis present

## 2016-06-20 DIAGNOSIS — E785 Hyperlipidemia, unspecified: Secondary | ICD-10-CM | POA: Diagnosis present

## 2016-06-20 DIAGNOSIS — L409 Psoriasis, unspecified: Secondary | ICD-10-CM | POA: Diagnosis present

## 2016-06-20 DIAGNOSIS — Z87891 Personal history of nicotine dependence: Secondary | ICD-10-CM

## 2016-06-20 LAB — CBC WITH DIFFERENTIAL/PLATELET
Basophils Absolute: 0 10*3/uL (ref 0.0–0.1)
Basophils Relative: 0 %
EOS ABS: 0.2 10*3/uL (ref 0.0–0.7)
Eosinophils Relative: 3 %
HEMATOCRIT: 32.5 % — AB (ref 39.0–52.0)
HEMOGLOBIN: 10.7 g/dL — AB (ref 13.0–17.0)
LYMPHS ABS: 0.8 10*3/uL (ref 0.7–4.0)
LYMPHS PCT: 18 %
MCH: 30 pg (ref 26.0–34.0)
MCHC: 32.9 g/dL (ref 30.0–36.0)
MCV: 91 fL (ref 78.0–100.0)
MONOS PCT: 16 %
Monocytes Absolute: 0.8 10*3/uL (ref 0.1–1.0)
NEUTROS PCT: 63 %
Neutro Abs: 3 10*3/uL (ref 1.7–7.7)
Platelets: 46 10*3/uL — ABNORMAL LOW (ref 150–400)
RBC: 3.57 MIL/uL — AB (ref 4.22–5.81)
RDW: 15.5 % (ref 11.5–15.5)
WBC: 4.7 10*3/uL (ref 4.0–10.5)

## 2016-06-20 LAB — COMPREHENSIVE METABOLIC PANEL
ALBUMIN: 3 g/dL — AB (ref 3.5–5.0)
ALK PHOS: 92 U/L (ref 38–126)
ALT: 51 U/L (ref 17–63)
ANION GAP: 7 (ref 5–15)
AST: 66 U/L — ABNORMAL HIGH (ref 15–41)
BUN: 11 mg/dL (ref 6–20)
CALCIUM: 8.6 mg/dL — AB (ref 8.9–10.3)
CHLORIDE: 103 mmol/L (ref 101–111)
CO2: 25 mmol/L (ref 22–32)
Creatinine, Ser: 1.1 mg/dL (ref 0.61–1.24)
GFR calc Af Amer: >60 mL/min (ref >60)
GFR calc non Af Amer: >60 mL/min (ref >60)
GLUCOSE: 97 mg/dL (ref 65–99)
Potassium: 3.8 mmol/L (ref 3.5–5.1)
SODIUM: 135 mmol/L (ref 135–145)
Total Bilirubin: 1.9 mg/dL — ABNORMAL HIGH (ref 0.3–1.2)
Total Protein: 6.7 g/dL (ref 6.5–8.1)

## 2016-06-20 LAB — POCT HEMOGLOBIN: Hemoglobin: 9.5 g/dL — AB (ref 14.1–18.1)

## 2016-06-20 LAB — PROTIME-INR
INR: 1.31
PROTHROMBIN TIME: 16.4 s — AB (ref 11.4–15.2)

## 2016-06-20 LAB — I-STAT CG4 LACTIC ACID, ED: LACTIC ACID, VENOUS: 1.36 mmol/L (ref 0.5–1.9)

## 2016-06-20 LAB — I-STAT TROPONIN, ED: Troponin i, poc: 0 ng/mL (ref 0.00–0.08)

## 2016-06-20 LAB — POC OCCULT BLOOD, ED: FECAL OCCULT BLD: POSITIVE — AB

## 2016-06-20 LAB — LIPASE, BLOOD: Lipase: 25 U/L (ref 11–51)

## 2016-06-20 MED ORDER — IOPAMIDOL (ISOVUE-300) INJECTION 61%
INTRAVENOUS | Status: AC
  Administered 2016-06-20: 100 mL
  Filled 2016-06-20: qty 100

## 2016-06-20 MED ORDER — PANTOPRAZOLE SODIUM 40 MG IV SOLR
40.0000 mg | Freq: Two times a day (BID) | INTRAVENOUS | Status: DC
  Administered 2016-06-20 – 2016-06-23 (×6): 40 mg via INTRAVENOUS
  Filled 2016-06-20 (×6): qty 40

## 2016-06-20 MED ORDER — FENTANYL CITRATE (PF) 100 MCG/2ML IJ SOLN
50.0000 ug | Freq: Once | INTRAMUSCULAR | Status: AC
  Administered 2016-06-20: 50 ug via INTRAVENOUS
  Filled 2016-06-20: qty 2

## 2016-06-20 NOTE — ED Provider Notes (Signed)
[facility] DEPT Provider Note   CSN: [id number] Arrival date & time: 06/20/16  1730  First Provider Contact:  First MD Initiated Contact with Patient 06/20/16 1737        History   Chief Complaint Chief Complaint  Patient presents with  . Abdominal Pain    HPI Vincent Ochoa is a 60 y.o. male.  The history is provided by the patient. No language interpreter was used.  Abdominal Pain     Vincent Ochoa is a 60 y.o. male who presents to the Emergency Department complaining of abdominal pain.  He presents from family practice clinic for evaluation of 3 days of left-sided abdominal pain. The pain is described as a stomach ache and is constant nature. The pain is better when he is laying down. His associated shortness of breath and subjective fevers. He's had 3 episodes of emesis and 2 dark stools. He reports generalized malaise. He reports associated chest heaviness and pressure.  Past Medical History:  Diagnosis Date  . Back pain, lumbosacral 2014  . Coronary artery disease    a. Evaluated by Dr. Stanford Breed 2012 - nonobstructive 2007-2008 by cath. b. Abnormal stress test 10/2014 but cath deferred temporarily due to thrombocytopenia. c. 10/2014: readm with AF-RVR/NSTEMI - s/p BMS to mLCx 11/04/14 (mod dz in RCA);  d. relook cath 01/2015 and 06/02/2015 patent stent, nonobs CAD, EF 55-65%.  . Elevated LFTs   . Facial cellulitis 2014   Periorbital  . Fatty liver US - 2012  . Frequent headaches   . GERD (gastroesophageal reflux disease)   . Hyperlipidemia   . Morbid obesity ([place])   . Obesity   . OSA (obstructive sleep apnea)    severe with AHI 56/hr with successful CPAP titration to 18cm H2O  . PAF (paroxysmal atrial fibrillation) ([place])    a. Episode 10/2014 at time of NSTEMI, spont converted to NSR. Observation for further episodes (not on anticoag at present time due to [facility] = 1, thrombocytopenia).  . Psoriasis   . Sinus bradycardia   . Thrombocytopenia ([place])    a. Onset  unclear, but noted 2012. ? 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 s/p prednisone, IVIG.    Patient Active Problem List   Diagnosis Date Noted  . Erectile dysfunction 06/03/2016  . Cirrhosis of liver with ascites ([place]) 02/25/2016  . Dyspnea   . Generalized abdominal pain   . Portal vein thrombosis   . Alcoholic cirrhosis of liver with ascites ([place])   . Bleeding gastric varices   . Esophageal varices with bleeding ([place])   . GI bleed 02/04/2016  . Gastrointestinal hemorrhage 02/04/2016  . Blood loss anemia   . Feeling down 01/10/2016  . Chest pain 01/02/2016  . Diastolic heart failure ([place]) 10/07/2015  . Leukopenia 09/15/2015  . Acrochordon 07/18/2015  . PAF (paroxysmal atrial fibrillation) ([place])   . Morbid obesity ([place])   . Transaminitis 04/23/2015  . Knee pain, bilateral 03/05/2015  . Depression 01/08/2015  . OSA (obstructive sleep apnea)   . Physical deconditioning 12/08/2014  . Paroxysmal atrial fibrillation ([place]) 11/05/2014  . Hyperlipidemia LDL goal <70 11/05/2014  . NSTEMI (non-ST elevated myocardial infarction) ([place])   . Back pain, lumbosacral 01/04/2013  . Thrombocytopenia ([place]) 11/29/2012  . Decreased libido 08/15/2011  . GERD (gastroesophageal reflux disease) 08/15/2011  . CAD (coronary artery disease) 07/29/2011  . Psoriasis 07/29/2011    Past Surgical History:  Procedure Laterality Date  . CARDIAC CATHETERIZATION    .  CARDIAC CATHETERIZATION N/A 06/02/2015   Procedure: Left Heart Cath and Coronary Angiography;  Surgeon: Jettie Booze, MD;  Location: [facility] CV LAB;  Service: Cardiovascular;  Laterality: N/A;  . CHOLECYSTECTOMY  2007  . FACIAL COSMETIC SURGERY  2014  . KNEE ARTHROPLASTY  1970's  . LEFT HEART CATHETERIZATION WITH CORONARY ANGIOGRAM N/A 11/04/2014   Procedure: LEFT HEART CATHETERIZATION WITH CORONARY ANGIOGRAM;  Surgeon: Jettie Booze, MD;  Location: [facility] CATH LAB;  Service: Cardiovascular;   Laterality: N/A;  . LEFT HEART CATHETERIZATION WITH CORONARY ANGIOGRAM N/A 01/28/2015   Procedure: LEFT HEART CATHETERIZATION WITH CORONARY ANGIOGRAM;  Surgeon: Sherren Mocha, MD;  Location[facility] CATH LAB;  Service: Cardiovascular;  Laterality: N/A;  . RADIOLOGY WITH ANESTHESIA N/A 02/04/2016   Procedure: RADIOLOGY WITH ANESTHESIA;  Surgeon: Medication Radiologist, MD;  Location: [facility];  Service: Radiology;  Laterality: N/A;  . TONSILLECTOMY  1960's       Home Medications    Prior to Admission medications   Medication Sig Start Date End Date Taking? Authorizing Provider  aspirin 81 MG tablet Take 1 tablet (81 mg total) by mouth daily. 06/03/16   Sela Hilding, MD  buPROPion (WELLBUTRIN XL) 150 MG 24 hr tablet Take 1 tablet (150 mg total) by mouth daily. 06/03/16   Sela Hilding, MD  CONSTULOSE 10 GM/15ML solution TAKE 45MLS BY MOUTH TWICE DAILY Patient not taking: Reported on 06/03/2016 04/13/16   Rosemarie Ax, MD  furosemide (LASIX) 80 MG tablet Take 1 tablet (80 mg total) by mouth 2 (two) times daily. 06/03/16   Sela Hilding, MD  isosorbide mononitrate (IMDUR) 30 MG 24 hr tablet Take 1 tablet (30 mg total) by mouth daily. 06/03/16   Sela Hilding, MD  nitroGLYCERIN (NITROSTAT) 0.4 MG SL tablet Place 1 tablet (0.4 mg total) under the tongue every 5 (five) minutes as needed for chest pain (up to 3 doses). 10/31/14   Dayna N Dunn, PA-C  pantoprazole (PROTONIX) 40 MG tablet Take 1 tablet (40 mg total) by mouth 2 (two) times daily. 06/03/16   Sela Hilding, MD  sertraline (ZOLOFT) 50 MG tablet Take 1 tablet (50 mg total) by mouth daily. Patient not taking: Reported on 06/03/2016 04/28/16   Rosemarie Ax, MD  shark liver oil-cocoa butter (PREPARATION H) 0.25-3-85.5 % suppository Place 1 suppository rectally as needed for hemorrhoids. 06/03/16   Sela Hilding, MD  spironolactone (ALDACTONE) 100 MG tablet Take 2 tablets (200 mg total) by mouth daily. 06/03/16   Sela Hilding, MD    Family History Family History  Problem Relation Age of Onset  . Cirrhosis Mother   . Heart attack Mother   . Hypertension Mother   . Hypertension Father   . Stroke Father   . Arthritis Maternal Grandmother   . Heart disease Sister   . Diabetes Sister   . Cancer - Ovarian Sister   . Heart disease      Maternal/paternal grandparents  . Heart attack Maternal Aunt     Social History Social History  Substance Use Topics  . Smoking status: Former Smoker    Packs/day: 0.20    Years: 43.00    Types: Cigarettes  . Smokeless tobacco: Never Used     Comment: 11/28/2012 "chew occasionally"  Quit for 2.5 months in early 2016  . Alcohol use 0.0 oz/week     Comment: 11/28/2012 "beer q 6 months or so"     Allergies   Review of patient's allergies indicates no known allergies.  Review of Systems Review of Systems  Gastrointestinal: Positive for abdominal pain.  All other systems reviewed and are negative.    Physical Exam Updated Vital Signs There were no vitals taken for this visit.  Physical Exam  Constitutional: He is oriented to person, place, and time. He appears well-developed and well-nourished.  HENT:  Head: Normocephalic and atraumatic.  Cardiovascular: Normal rate and regular rhythm.   No murmur heard. Pulmonary/Chest: Effort normal and breath sounds normal. No respiratory distress.  Abdominal:  Mildly distended abdomen with moderate diffuse tenderness, greatest over the left hemiabdomen.  Genitourinary:  Genitourinary Comments: Brown stool, nontender rectal exam  Musculoskeletal: He exhibits no tenderness.  1+ edema bilateral lower external male  Neurological: He is alert and oriented to person, place, and time.  Skin: Skin is warm and dry.  Psychiatric: He has a normal mood and affect. His behavior is normal.  Nursing note and vitals reviewed.    ED Treatments / Results  Labs (all labs ordered are listed, but only abnormal results are  displayed) Labs Reviewed  COMPREHENSIVE METABOLIC PANEL  LIPASE, BLOOD  CBC WITH DIFFERENTIAL/PLATELET  PROTIME-INR  I-STAT CG4 LACTIC ACID, ED  I-STAT TROPOININ, ED    EKG  EKG Interpretation None       Radiology No results found.  Procedures Procedures (including critical care time)  Medications Ordered in ED Medications - No data to display   Initial Impression / Assessment and Plan / ED Course  I have reviewed the triage vital signs and the nursing notes.  Pertinent labs & imaging results that were available during my care of the patient were reviewed by me and considered in my medical decision making (see chart for details).  Clinical Course    Pt referred from clinic for evaluation of sob, chest pain, abdominal pain.  Pt developed small volume hematemesis in ED and started on protonix.  Plan to admit to family medicine for further evaluation and management.    Final Clinical Impressions(s) / ED Diagnoses   Final diagnoses:  Left upper quadrant pain    New Prescriptions New Prescriptions   No medications on file     Quintella Reichert, MD 06/22/16 1341

## 2016-06-20 NOTE — Progress Notes (Signed)
[facility] Progress Note  Subjective:  Kalix Schey is a 60-y/o male with history of NASH/alcoholic cirrhosis, CAD, HFpEF, psoriasis, chronic thrombocytopenia and OSA who presents with abdominal pain and dark stools x 3 days. He reports left-sided abdominal pain began Saturday and that he feels that his abdomen has grown a lot larger. Since start of abdominal pain, he has felt weak and tired. His wife says today is the first day he has really been out of bed. He also endorses some shortness of breath and a "heavy feeling" in his chest for which he has taken aspirin without improvement. He denies pain or straining with BMs. He denies throwing up blood. He has had decreased appetite and subjective fevers. He drinks only occasionally now and denies any recent heavy alcohol use. Of note, he had a 10-day hospitalization in March for hematemesis with large gastric varices; Balloon-Occluded Retrograde Transvenous Obliteration of Gastric Varices performed 3/17.  ROS: No falls, no n/v/d  No Known Allergies  Social: Former smoker  Objective: Blood pressure 124/73, pulse 97, temperature 98.6 F (37 C), temperature source Oral, weight 277 lb 3.2 oz (125.7 kg), SpO2 98 %. Constitutional: Tired appearing male in NAD HENT: Normal oropharynx and nares.  Cardiovascular: RRR, S1, S2, no m/r/g.  Pulmonary/Chest: Effort normal and breath sounds normal. No respiratory distress.  Abdominal: Moderate-severe tenderness to palpation across left upper and lower quadrants with additional tenderness over epigastric and periumbilical region. No rebound tenderness. Fluid wave present. Soft and distended without guarding. Did not appreciate hepatomegaly.  Musculoskeletal: FROM.  Neurological: AOx3, no focal deficits. Skin: Skin is warm and dry. Psoriatic lesions of shins.  Psychiatric: Normal mood and affect.  Vitals reviewed  Assessment/Plan: Given severity of patient's abdominal pain, history of  gastric varices, and presence of ascites with complaint of subjective fevers, recommended transfer to the ED for abdominal imaging and possible need for peritoneal fluid studies to rule out SBP.   Prior to transfer, obtained EKG, which was unchanged from previous tracing, and POC hemoglobin that was 9.5 (near baseline of 10).   Olene Floss, MD [place], PGY-2

## 2016-06-20 NOTE — ED Triage Notes (Signed)
Patient comes from family practice with complaints of abdominal pain.  Patient has fatty liver disease and gained almost 20 pounds in 3 days.  Has not been able to eat or drink much in those days.  Emesis reported with green sputum.  Patient had EKG done at family practice. A&Ox4

## 2016-06-21 ENCOUNTER — Encounter (HOSPITAL_COMMUNITY): Payer: Self-pay | Admitting: Gastroenterology

## 2016-06-21 DIAGNOSIS — G4733 Obstructive sleep apnea (adult) (pediatric): Secondary | ICD-10-CM

## 2016-06-21 DIAGNOSIS — R161 Splenomegaly, not elsewhere classified: Secondary | ICD-10-CM | POA: Diagnosis present

## 2016-06-21 DIAGNOSIS — I81 Portal vein thrombosis: Secondary | ICD-10-CM

## 2016-06-21 DIAGNOSIS — R1012 Left upper quadrant pain: Secondary | ICD-10-CM

## 2016-06-21 DIAGNOSIS — K7031 Alcoholic cirrhosis of liver with ascites: Secondary | ICD-10-CM

## 2016-06-21 DIAGNOSIS — F329 Major depressive disorder, single episode, unspecified: Secondary | ICD-10-CM | POA: Diagnosis present

## 2016-06-21 DIAGNOSIS — I110 Hypertensive heart disease with heart failure: Secondary | ICD-10-CM | POA: Diagnosis present

## 2016-06-21 DIAGNOSIS — I2510 Atherosclerotic heart disease of native coronary artery without angina pectoris: Secondary | ICD-10-CM

## 2016-06-21 DIAGNOSIS — I480 Paroxysmal atrial fibrillation: Secondary | ICD-10-CM | POA: Diagnosis present

## 2016-06-21 DIAGNOSIS — Z6833 Body mass index (BMI) 33.0-33.9, adult: Secondary | ICD-10-CM | POA: Diagnosis not present

## 2016-06-21 DIAGNOSIS — K766 Portal hypertension: Secondary | ICD-10-CM | POA: Diagnosis present

## 2016-06-21 DIAGNOSIS — K920 Hematemesis: Secondary | ICD-10-CM | POA: Diagnosis present

## 2016-06-21 DIAGNOSIS — Z7982 Long term (current) use of aspirin: Secondary | ICD-10-CM | POA: Diagnosis not present

## 2016-06-21 DIAGNOSIS — E785 Hyperlipidemia, unspecified: Secondary | ICD-10-CM | POA: Diagnosis present

## 2016-06-21 DIAGNOSIS — I5022 Chronic systolic (congestive) heart failure: Secondary | ICD-10-CM | POA: Diagnosis not present

## 2016-06-21 DIAGNOSIS — L409 Psoriasis, unspecified: Secondary | ICD-10-CM | POA: Diagnosis present

## 2016-06-21 DIAGNOSIS — Z87891 Personal history of nicotine dependence: Secondary | ICD-10-CM | POA: Diagnosis not present

## 2016-06-21 DIAGNOSIS — E662 Morbid (severe) obesity with alveolar hypoventilation: Secondary | ICD-10-CM | POA: Diagnosis present

## 2016-06-21 DIAGNOSIS — K3189 Other diseases of stomach and duodenum: Secondary | ICD-10-CM | POA: Diagnosis present

## 2016-06-21 DIAGNOSIS — I5042 Chronic combined systolic (congestive) and diastolic (congestive) heart failure: Secondary | ICD-10-CM

## 2016-06-21 DIAGNOSIS — Z66 Do not resuscitate: Secondary | ICD-10-CM | POA: Diagnosis present

## 2016-06-21 DIAGNOSIS — R109 Unspecified abdominal pain: Secondary | ICD-10-CM | POA: Diagnosis present

## 2016-06-21 DIAGNOSIS — I8501 Esophageal varices with bleeding: Secondary | ICD-10-CM | POA: Diagnosis present

## 2016-06-21 DIAGNOSIS — K219 Gastro-esophageal reflux disease without esophagitis: Secondary | ICD-10-CM | POA: Diagnosis present

## 2016-06-21 LAB — CBC
HCT: 32.1 % — ABNORMAL LOW (ref 39.0–52.0)
HCT: 30.2 % — ABNORMAL LOW (ref 39.0–52.0)
Hemoglobin: 10.4 g/dL — ABNORMAL LOW (ref 13.0–17.0)
Hemoglobin: 9.9 g/dL — ABNORMAL LOW (ref 13.0–17.0)
MCH: 29.8 pg (ref 26.0–34.0)
MCH: 29.6 pg (ref 26.0–34.0)
MCHC: 32.4 g/dL (ref 30.0–36.0)
MCHC: 32.8 g/dL (ref 30.0–36.0)
MCV: 92 fL (ref 78.0–100.0)
MCV: 90.4 fL (ref 78.0–100.0)
PLATELETS: 40 10*3/uL — AB (ref 150–400)
PLATELETS: 38 10*3/uL — AB (ref 150–400)
RBC: 3.49 MIL/uL — ABNORMAL LOW (ref 4.22–5.81)
RBC: 3.34 MIL/uL — AB (ref 4.22–5.81)
RDW: 15.7 % — AB (ref 11.5–15.5)
RDW: 15.6 % — AB (ref 11.5–15.5)
WBC: 3.9 10*3/uL — ABNORMAL LOW (ref 4.0–10.5)
WBC: 2.8 10*3/uL — ABNORMAL LOW (ref 4.0–10.5)

## 2016-06-21 LAB — TYPE AND SCREEN
ABO/RH(D): A NEG
ANTIBODY SCREEN: NEGATIVE

## 2016-06-21 LAB — OCCULT BLOOD X 1 CARD TO LAB, STOOL: Fecal Occult Bld: NEGATIVE

## 2016-06-21 LAB — MRSA PCR SCREENING: MRSA by PCR: NEGATIVE

## 2016-06-21 MED ORDER — FUROSEMIDE 80 MG PO TABS
80.0000 mg | ORAL_TABLET | Freq: Two times a day (BID) | ORAL | Status: DC
  Administered 2016-06-21 – 2016-06-23 (×5): 80 mg via ORAL
  Filled 2016-06-21 (×5): qty 1

## 2016-06-21 MED ORDER — CYCLOBENZAPRINE HCL 10 MG PO TABS
5.0000 mg | ORAL_TABLET | Freq: Once | ORAL | Status: AC
  Administered 2016-06-21: 5 mg via ORAL
  Filled 2016-06-21: qty 1

## 2016-06-21 MED ORDER — ASPIRIN 81 MG PO CHEW: 81.0000 mg | CHEWABLE_TABLET | Freq: Every day | ORAL | Status: DC

## 2016-06-21 MED ORDER — ONDANSETRON HCL 4 MG/2ML IJ SOLN
4.0000 mg | Freq: Four times a day (QID) | INTRAMUSCULAR | Status: DC | PRN
  Administered 2016-06-22: 4 mg via INTRAVENOUS
  Filled 2016-06-21: qty 2

## 2016-06-21 MED ORDER — SODIUM CHLORIDE 0.9 % IV SOLN
INTRAVENOUS | Status: DC
  Administered 2016-06-21 – 2016-06-23 (×3): via INTRAVENOUS

## 2016-06-21 MED ORDER — SODIUM CHLORIDE 0.9 % IV SOLN
INTRAVENOUS | Status: DC
  Administered 2016-06-21 – 2016-06-22 (×2): via INTRAVENOUS

## 2016-06-21 MED ORDER — ISOSORBIDE MONONITRATE ER 30 MG PO TB24
30.0000 mg | ORAL_TABLET | Freq: Every day | ORAL | Status: DC
Start: 2016-06-21 — End: 2016-06-23
  Administered 2016-06-21 – 2016-06-23 (×3): 30 mg via ORAL
  Filled 2016-06-21 (×3): qty 1

## 2016-06-21 MED ORDER — ONDANSETRON HCL 4 MG PO TABS: 4.0000 mg | ORAL_TABLET | Freq: Four times a day (QID) | ORAL | Status: DC | PRN

## 2016-06-21 MED ORDER — SODIUM CHLORIDE 0.9% FLUSH
3.0000 mL | Freq: Two times a day (BID) | INTRAVENOUS | Status: DC
  Administered 2016-06-21 – 2016-06-22 (×4): 3 mL via INTRAVENOUS

## 2016-06-21 MED ORDER — SPIRONOLACTONE 25 MG PO TABS
200.0000 mg | ORAL_TABLET | Freq: Every day | ORAL | Status: DC
  Administered 2016-06-21 – 2016-06-23 (×3): 200 mg via ORAL
  Filled 2016-06-21 (×3): qty 8

## 2016-06-21 MED ORDER — BUPROPION HCL ER (XL) 150 MG PO TB24
150.0000 mg | ORAL_TABLET | Freq: Every day | ORAL | Status: DC
  Administered 2016-06-21 – 2016-06-23 (×3): 150 mg via ORAL
  Filled 2016-06-21 (×3): qty 1

## 2016-06-21 NOTE — Progress Notes (Signed)
Family Medicine Teaching Service Daily Progress Note Intern Pager: [phone]  Patient name: [name] Medical record number: [id number] Date of birth: 18-Jul-1956 Age: 60 y.o. Gender: male  Primary Care Provider: Ralene Ok, MD Consultants: Gastroenterology Code Status: DNR  Pt Overview and Major Events to Date:  8/1 admit for hematemesis  Assessment and Plan: Vincent Ochoa is a 60 y.o. male presenting with abdominal pain. PMH is significant for alcoholic cirrhosis, CAD, depression, HFpEF, HLD, OSA.  Hematemesis: With L abdominal pain and melena. 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 Last EGD 01/2016 with large gastric varices with no active bleeding but high risk for re-bleeding. CT Abd showing known cirrhosis with splenomegaly, ascites, and perisplenic varices. Now Hgb 9.9 - IV Protonix 40 mg BID - GI consult, appreciate recs. - Telemetry - NPO for possible GI procedures  - Zofran PRN - NS@100cc /hr - monitor CBC   Alcoholic cirrhosis: Heavy EtOH use in 1990s. Hx variceal bleeds s/p BRTO in 01/2016. Followed by Dr. Michail Sermon, though not taking prescribed lactulose. Currently with abdominal distention and accompanying pain. Hepatitis panel neg 02/10/16. - IV Protonix  - Consult GI   HFrEF with CAD: S/p bare metal stent placement in 2015. Last EF 50% (01/2015). Not currently on optimal medical therapy, as only taking ASA, Imdur, and spironolactone (per cardiology note in 01/2016, patient formerly on ASA, Plavix, beta blocker, statin, and Imdur).  - Continue home Lasix, Imdur, spironolactone - Hold ASA given active bleed - Consider resuming beta blocker and statin as recommended by patient's cardiologist    Chronic portal vein thrombosis Found on CT imaging during last admission on march 2017, GI was consulted at that time and recommended that the risks of anticoagulation outweighed the benefits given  his significant bleed history.  Depression - Continue home bupropion  OSA - CPAP QHS  FEN/GI: NS@100cc /hr, Protonix, NPO  Prophylaxis: only SCDs given active bleed  Disposition: pending  Subjective:  States feels well this am overall but still weak. Has not had further episodes of hematemesis since admit but continues to feel SOB. Feels L abd pain has improved overnight, now in LUQ, dull. Feels hungry, has been moving bowels and passing gas.   Objective: Temp:  [97.5 F (36.4 C)-98.6 F (37 C)] 98.6 F (37 C) (08/01 0555) Pulse Rate:  [81-97] 83 (08/01 0555) Resp:  [13-19] 18 (08/01 0555) BP: (116-155)/(65-76) 155/65 (08/01 0555) SpO2:  [92 %-99 %] 92 % (08/01 0555) Weight:  [271 lb 14.4 oz (123.3 kg)-277 lb 3.2 oz (125.7 kg)] 271 lb 14.4 oz (123.3 kg) (08/01 0038) Physical Exam:  General: obese male lying in bed in NAD Cardiovascular: distant heart sounds, RRR Respiratory: slightly labored WOB with speech on RA, CTAB, no wheezes  Abdomen: distended, tender in LUQ and at midline, +BS Extremities: 1+ pitting edema to knee, +DP pulse bilaterally  Laboratory:  Recent Labs Lab 06/20/16 1820 06/21/16 0104 06/21/16 0738  WBC 4.7 3.9* 2.8*  HGB 10.7* 10.4* 9.9*  HCT 32.5* 32.1* 30.2*  PLT 46* 40* 38*    Recent Labs Lab 06/20/16 1820  NA 135  K 3.8  CL 103  CO2 25  BUN 11  CREATININE 1.10  CALCIUM 8.6*  PROT 6.7  BILITOT 1.9*  ALKPHOS 92  ALT 51  AST 66*  GLUCOSE 97   Trop 0.00 Lactic acid 1.36    Imaging/Diagnostic Tests: Ct Abdomen Pelvis W Contrast  Result Date: 06/20/2016 CLINICAL DATA:  Onset  of LEFT-sided flank and abdominal pain 2 days ago, nausea, vomiting, diarrhea, history of fatty liver, GERD, coronary artery disease, obesity, atrial fibrillation, cirrhosis with ascites and varices; post sclerotherapy of gastric varices March 2017 EXAM: CT ABDOMEN AND PELVIS WITH CONTRAST TECHNIQUE: Multidetector CT imaging of the abdomen and pelvis was  performed using the standard protocol following bolus administration of intravenous contrast. Sagittal and coronal MPR images reconstructed from axial data set. CONTRAST:  11mL ISOVUE-300 IOPAMIDOL (ISOVUE-300) INJECTION 61% IV. No oral contrast was administered. COMPARISON:  03/10/2016, 02/16/2016 FINDINGS: Lower chest:  Lung bases clear Hepatobiliary: Cirrhotic appearing liver with nodular margins. Post cholecystectomy. No definite focal hepatic mass identified. Pancreas: Atrophic without focal mass. Spleen: Splenomegaly, 18.7 x 8.2 x 18.2 cm with calculated volume of 1460 mL. No focal mass lesion. Adrenals/Urinary Tract: Adrenal glands normal appearance. Small nonobstructive RIGHT renal calculi. No evidence of renal mass or hydronephrosis. Unremarkable bladder and ureters. Stomach/Bowel: Stomach decompressed, suboptimally assessed. Large and small bowel loops grossly normal caliber without obstruction or dilatation. Scattered sigmoid diverticula without definite evidence of diverticulitis. Appendix not definitely visualized. Vascular/Lymphatic: Decreased amount of sclerosis and at site of prior gastric varices embolization. Small varices at splenic hilum. Suspect small amount of chronic thrombus at the confluence of the portal vein, less extensive than on the 02/16/2016 exam. Major vascular structures otherwise grossly patent. Aortic and iliac arterial calcifications. Coronary arterial calcifications. Normal sized retroperitoneal nodes without adenopathy. Reproductive: N/A Other: Small BILATERAL inguinal hernias containing fat, larger on RIGHT. Scattered ascites throughout abdomen and pelvis. Diffuse edema of mesenteric. No free air. Musculoskeletal: No acute osseous findings. IMPRESSION: Cirrhotic liver with splenomegaly and ascites as well as perisplenic varices. Small amount of chronic thrombus within the confluence of the portal vein, less extensive than seen on 02/16/2016. No additional acute  intra-abdominal or intrapelvic abnormalities. Small BILATERAL inguinal hernias. Mild distal colonic diverticulosis. Nonobstructive RIGHT renal calculi. Electronically Signed   By: Vincent Ochoa M.D.   On: 06/20/2016 21:38    Bufford Lope, DO 06/21/2016, 9:44 AM PGY-1, [place] Intern pager: [phone], text pages welcome

## 2016-06-21 NOTE — Discharge Summary (Signed)
[facility] Discharge Summary  Patient name: Vincent Ochoa Medical record number: [id number] Date of birth: 12-14-1955 Age: 60 y.o. Gender: male Date of Admission: 06/20/2016  Date of Discharge: 06/23/16 Admitting Physician: Dickie La, MD  Primary Care Provider: Ralene Ok, MD Consultants: Gastroenterology  Indication for Hospitalization: Hematemesis  Discharge Diagnoses/Problem List:  Hematemesis s/p variceal banding Alcoholic cirrhosis HFrEF CAD Chronic portal vein thrombosis Depression OSA  Disposition: Home  Discharge Condition: Stable, improved  Discharge Exam:  General: NAD Cardiovascular: RRR, no murmurs rubs or gallops Respiratory: CTA bilaterally, no wheezes rhonchi or rales Abdomen: +mild epigastric pain to palpation, soft, and nondistended, normoactive bowel sounds Extremities: no edema, warm and well-perfused  Brief Hospital Course:  [name] a 60 y.o.malepresenting with abdominal pain. PMH is significant for alcoholic cirrhosis, CAD, depression, HFpEF, HLD, OSA.  Hematemesis in the setting of alcoholic cirrhosis Patient presented with L sided abdominal pain that started 3 days prior to admission and hematemesis that was 2 episodes per day of streaks of bright red blood in vomitus. Given the setting of alcoholic cirrhosis and recent admission in March 2017 of variceal bleeds that required an ICU stay at that time, he was monitored. His hemoglobin remained stable around 10 and GI was consulted. An EGD was performed, showing distal esophageal varices with some red spots but no signs of active bleeding status post banding.  Patient tolerated procedure well and his diet was advanced before discharge.  HFrEF with CAD Patient was not admitted on optimal medical therapy. During patient's hospital stay, his blood pressure was around 110s/70s so it was decided to resume a cardioselective beta blocker even though patient had not  tolerated metoprolol as an outpatient due to hypotension. He was started on Coreg 3.125mg  and tolerated it well. It was decided that given patient's history of recurrent bleeding varices, that ASA should be held on discharge.  Chronic portal vein thrombosis Found on CT imaging during last admission on march 2017, GI was consulted at that time and recommendedthat the risks of anticoagulation outweighed the benefits given his significant bleed history.  The remainder of his chronic medical conditions were stable and changes were made to his home medications.  Issues for Follow Up:  1. Please make sure patient follows up with GI Dr Michail Sermon for repeat EGD in 1 month. 2. Patient should see cardiology, to see if can DC ASA given low platelet count and risk of recurrent variceal bleeding  Significant Procedures: EGD  Significant Labs and Imaging:   Recent Labs Lab 06/21/16 0738 06/22/16 0224 06/23/16 0321  WBC 2.8* 2.7* 3.0*  HGB 9.9* 10.0* 10.3*  HCT 30.2* 30.2* 31.2*  PLT 38* 46* 52*    Recent Labs Lab 06/20/16 1820 06/22/16 0224 06/23/16 0321  NA 135 137 135  K 3.8 3.8 4.0  CL 103 105 102  CO2 25 25 27   GLUCOSE 97 86 98  BUN 11 9 10   CREATININE 1.10 1.03 1.07  CALCIUM 8.6* 8.4* 8.5*  ALKPHOS 92 84  --   AST 66* 55*  --   ALT 51 45  --   ALBUMIN 3.0* 2.6*  --     Ct Abdomen Pelvis W Contrast  Result Date: 06/20/2016 CLINICAL DATA:  Onset of LEFT-sided flank and abdominal pain 2 days ago, nausea, vomiting, diarrhea, history of fatty liver, GERD, coronary artery disease, obesity, atrial fibrillation, cirrhosis with ascites and varices; post sclerotherapy of gastric varices March 2017 EXAM: CT ABDOMEN AND PELVIS  WITH CONTRAST TECHNIQUE: Multidetector CT imaging of the abdomen and pelvis was performed using the standard protocol following bolus administration of intravenous contrast. Sagittal and coronal MPR images reconstructed from axial data set. CONTRAST:  127mL ISOVUE-300  IOPAMIDOL (ISOVUE-300) INJECTION 61% IV. No oral contrast was administered. COMPARISON:  03/10/2016, 02/16/2016 FINDINGS: Lower chest:  Lung bases clear Hepatobiliary: Cirrhotic appearing liver with nodular margins. Post cholecystectomy. No definite focal hepatic mass identified. Pancreas: Atrophic without focal mass. Spleen: Splenomegaly, 18.7 x 8.2 x 18.2 cm with calculated volume of 1460 mL. No focal mass lesion. Adrenals/Urinary Tract: Adrenal glands normal appearance. Small nonobstructive RIGHT renal calculi. No evidence of renal mass or hydronephrosis. Unremarkable bladder and ureters. Stomach/Bowel: Stomach decompressed, suboptimally assessed. Large and small bowel loops grossly normal caliber without obstruction or dilatation. Scattered sigmoid diverticula without definite evidence of diverticulitis. Appendix not definitely visualized. Vascular/Lymphatic: Decreased amount of sclerosis and at site of prior gastric varices embolization. Small varices at splenic hilum. Suspect small amount of chronic thrombus at the confluence of the portal vein, less extensive than on the 02/16/2016 exam. Major vascular structures otherwise grossly patent. Aortic and iliac arterial calcifications. Coronary arterial calcifications. Normal sized retroperitoneal nodes without adenopathy. Reproductive: N/A Other: Small BILATERAL inguinal hernias containing fat, larger on RIGHT. Scattered ascites throughout abdomen and pelvis. Diffuse edema of mesenteric. No free air. Musculoskeletal: No acute osseous findings. IMPRESSION: Cirrhotic liver with splenomegaly and ascites as well as perisplenic varices. Small amount of chronic thrombus within the confluence of the portal vein, less extensive than seen on 02/16/2016. No additional acute intra-abdominal or intrapelvic abnormalities. Small BILATERAL inguinal hernias. Mild distal colonic diverticulosis. Nonobstructive RIGHT renal calculi. Electronically Signed   By: Lavonia Dana M.D.   On:  06/20/2016 21:38    Results/Tests Pending at Time of Discharge: none  Discharge Medications:    Medication List    STOP taking these medications   aspirin 81 MG tablet   CONSTULOSE 10 GM/15ML solution Generic drug:  lactulose   sertraline 50 MG tablet Commonly known as:  ZOLOFT   shark liver oil-cocoa butter 0.25-3-85.5 % suppository Commonly known as:  PREPARATION H     TAKE these medications   atorvastatin 40 MG tablet Commonly known as:  LIPITOR Take 1 tablet (40 mg total) by mouth daily at 8 pm.   buPROPion 150 MG 24 hr tablet Commonly known as:  WELLBUTRIN XL Take 1 tablet (150 mg total) by mouth daily.   carvedilol 3.125 MG tablet Commonly known as:  COREG Take 1 tablet (3.125 mg total) by mouth 2 (two) times daily with a meal.   furosemide 80 MG tablet Commonly known as:  LASIX Take 1 tablet (80 mg total) by mouth 2 (two) times daily.   isosorbide mononitrate 30 MG 24 hr tablet Commonly known as:  IMDUR Take 1 tablet (30 mg total) by mouth daily.   nitroGLYCERIN 0.4 MG SL tablet Commonly known as:  NITROSTAT Place 1 tablet (0.4 mg total) under the tongue every 5 (five) minutes as needed for chest pain (up to 3 doses).   pantoprazole 40 MG tablet Commonly known as:  PROTONIX Take 1 tablet (40 mg total) by mouth 2 (two) times daily.   spironolactone 100 MG tablet Commonly known as:  ALDACTONE Take 2 tablets (200 mg total) by mouth daily.       Discharge Instructions: Please refer to Patient Instructions section of EMR for full details.  Patient was counseled important signs and symptoms that should prompt return  to medical care, changes in medications, dietary instructions, activity restrictions, and follow up appointments.   Follow-Up Appointments: Follow-up Information    Lear Ng., MD. Daphane Shepherd on 07/01/2016.   Specialty:  Gastroenterology Why:  Your appointment with Dr. Michail Sermon is on 8/1//2017 at 8:45am. Contact information: [address] [place] [address] [phone]        Ralene Ok, MD Follow up on 07/06/2016.   Specialty:  Family Medicine Why:  Go to your appointment with your PCP at 2:30pm on 07/06/2016 at the family medicine center.  Contact information: [place] [address] [phone]           Deneice Wack J Sharnise Blough, DO 06/23/2016, 10:02 PM PGY-1, [place]

## 2016-06-21 NOTE — Consult Note (Signed)
Reason for Consult: GI bleed Referring Physician: Hospital team  Vincent Ochoa is an 60 y.o. male.  HPI: Patient seen and examined and our office computer chart and the hospital computer chart was reviewed and is actually difficult to get a accurate history from the patient and he does not remember a previous endoscopy which as best I can tell was done [place] and his never had a colonoscopy and he gets upset when you ask him when he quit drinking because he says he did not drink in 35 years but will go on to tell you still have a glass of wine or beer now and then and it sounds like his last drink was in [place] 3 weeks ago and he is on an aspirin a day for his heart and this week and felt bad and had black stools and wasn't sure if he threw up any blood or just coughed up a little bit of blood and he also had some left-sided abdominal pain which is now better and is feeling better now and wants to eat and has no other complaints but his mother had cirrhosis as well but he cannot say his liver doctor is and he has not been referred for consideration of transplant  Past Medical History:  Diagnosis Date  . Back pain, lumbosacral 2014  . Coronary artery disease    a. Evaluated by Dr. Stanford Breed 2012 - nonobstructive 2007-2008 by cath. b. Abnormal stress test 10/2014 but cath deferred temporarily due to thrombocytopenia. c. 10/2014: readm with AF-RVR/NSTEMI - s/p BMS to mLCx 11/04/14 (mod dz in RCA);  d. relook cath 01/2015 and 06/02/2015 patent stent, nonobs CAD, EF 55-65%.  . Elevated LFTs   . Facial cellulitis 2014   Periorbital  . Fatty liver US - 2012  . Frequent headaches   . GERD (gastroesophageal reflux disease)   . Hyperlipidemia   . Morbid obesity ([place])   . Obesity   . OSA (obstructive sleep apnea)    severe with AHI 56/hr with successful CPAP titration to 18cm H2O  . PAF (paroxysmal atrial fibrillation) ([place])    a. Episode 10/2014 at time of NSTEMI, spont converted to NSR.  Observation for further episodes (not on anticoag at present time due to [facility] = 1, thrombocytopenia).  . Psoriasis   . Sinus bradycardia   . Thrombocytopenia ([place])    a. Onset unclear, but noted 2012. ? 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 s/p prednisone, IVIG.    Past Surgical History:  Procedure Laterality Date  . CARDIAC CATHETERIZATION    . CARDIAC CATHETERIZATION N/A 06/02/2015   Procedure: Left Heart Cath and Coronary Angiography;  Surgeon: Jettie Booze, MD;  Location: [facility];  Service: Cardiovascular;  Laterality: N/A;  . CHOLECYSTECTOMY  2007  . FACIAL COSMETIC SURGERY  2014  . KNEE ARTHROPLASTY  1970's  . LEFT HEART CATHETERIZATION WITH CORONARY ANGIOGRAM N/A 11/04/2014   Procedure: LEFT HEART CATHETERIZATION WITH CORONARY ANGIOGRAM;  Surgeon: Jettie Booze, MD;  Location: [facility] CATH LAB;  Service: Cardiovascular;  Laterality: N/A;  . LEFT HEART CATHETERIZATION WITH CORONARY ANGIOGRAM N/A 01/28/2015   Procedure: LEFT HEART CATHETERIZATION WITH CORONARY ANGIOGRAM;  Surgeon: Sherren Mocha, MD;  Location: [facility] CATH LAB;  Service: Cardiovascular;  Laterality: N/A;  . RADIOLOGY WITH ANESTHESIA N/A 02/04/2016   Procedure: RADIOLOGY WITH ANESTHESIA;  Surgeon: Medication Radiologist, MD;  Location: [facility];  Service: Radiology;  Laterality: N/A;  . TONSILLECTOMY  1960's  Family History  Problem Relation Age of Onset  . Cirrhosis Mother   . Heart attack Mother   . Hypertension Mother   . Hypertension Father   . Stroke Father   . Arthritis Maternal Grandmother   . Heart disease Sister   . Diabetes Sister   . Cancer - Ovarian Sister   . Heart disease      Maternal/paternal grandparents  . Heart attack Maternal Aunt     Social History:  reports that he has quit smoking. His smoking use included Cigarettes. He has a 8.60 pack-year smoking history. He has never used smokeless tobacco. He reports that he drinks alcohol. He  reports that he does not use drugs.  Allergies: No Known Allergies  Medications: I have reviewed the patient's current medications.  Results for orders placed or performed during the hospital encounter of 06/20/16 (from the past 48 hour(s))  POC occult blood, ED     Status: Abnormal   Collection Time: 06/20/16  6:04 PM  Result Value Ref Range   Fecal Occult Bld POSITIVE (A) NEGATIVE  Comprehensive metabolic panel     Status: Abnormal   Collection Time: 06/20/16  6:20 PM  Result Value Ref Range   Sodium 135 135 - 145 mmol/L   Potassium 3.8 3.5 - 5.1 mmol/L   Chloride 103 101 - 111 mmol/L   CO2 25 22 - 32 mmol/L   Glucose, Bld 97 65 - 99 mg/dL   BUN 11 6 - 20 mg/dL   Creatinine, Ser 1.10 0.61 - 1.24 mg/dL   Calcium 8.6 (L) 8.9 - 10.3 mg/dL   Total Protein 6.7 6.5 - 8.1 g/dL   Albumin 3.0 (L) 3.5 - 5.0 g/dL   AST 66 (H) 15 - 41 U/L   ALT 51 17 - 63 U/L   Alkaline Phosphatase 92 38 - 126 U/L   Total Bilirubin 1.9 (H) 0.3 - 1.2 mg/dL   GFR calc non Af Amer >60 >60 mL/min   GFR calc Af Amer >60 >60 mL/min    Comment: (NOTE) The eGFR has been calculated using the CKD EPI equation. This calculation has not been validated in all clinical situations. eGFR's persistently <60 mL/min signify possible Chronic Kidney Disease.    Anion gap 7 5 - 15  Lipase, blood     Status: None   Collection Time: 06/20/16  6:20 PM  Result Value Ref Range   Lipase 25 11 - 51 U/L  CBC with Differential     Status: Abnormal   Collection Time: 06/20/16  6:20 PM  Result Value Ref Range   WBC 4.7 4.0 - 10.5 K/uL   RBC 3.57 (L) 4.22 - 5.81 MIL/uL   Hemoglobin 10.7 (L) 13.0 - 17.0 g/dL   HCT 32.5 (L) 39.0 - 52.0 %   MCV 91.0 78.0 - 100.0 fL   MCH 30.0 26.0 - 34.0 pg   MCHC 32.9 30.0 - 36.0 g/dL   RDW 15.5 11.5 - 15.5 %   Platelets 46 (L) 150 - 400 K/uL    Comment: REPEATED TO VERIFY SPECIMEN CHECKED FOR CLOTS PLATELETS APPEAR DECREASED PLATELET COUNT CONFIRMED BY SMEAR    Neutrophils Relative % 63  %   Neutro Abs 3.0 1.7 - 7.7 K/uL   Lymphocytes Relative 18 %   Lymphs Abs 0.8 0.7 - 4.0 K/uL   Monocytes Relative 16 %   Monocytes Absolute 0.8 0.1 - 1.0 K/uL   Eosinophils Relative 3 %   Eosinophils Absolute 0.2 0.0 -  0.7 K/uL   Basophils Relative 0 %   Basophils Absolute 0.0 0.0 - 0.1 K/uL  Protime-INR     Status: Abnormal   Collection Time: 06/20/16  6:20 PM  Result Value Ref Range   Prothrombin Time 16.4 (H) 11.4 - 15.2 seconds   INR 1.31   I-stat troponin, ED     Status: None   Collection Time: 06/20/16  6:31 PM  Result Value Ref Range   Troponin i, poc 0.00 0.00 - 0.08 ng/mL   Comment 3            Comment: Due to the release kinetics of cTnI, a negative result within the first hours of the onset of symptoms does not rule out myocardial infarction with certainty. If myocardial infarction is still suspected, repeat the test at appropriate intervals.   I-Stat CG4 Lactic Acid, ED     Status: None   Collection Time: 06/20/16  6:34 PM  Result Value Ref Range   Lactic Acid, Venous 1.36 0.5 - 1.9 mmol/L  CBC     Status: Abnormal   Collection Time: 06/21/16  1:04 AM  Result Value Ref Range   WBC 3.9 (L) 4.0 - 10.5 K/uL   RBC 3.49 (L) 4.22 - 5.81 MIL/uL   Hemoglobin 10.4 (L) 13.0 - 17.0 g/dL   HCT 32.1 (L) 39.0 - 52.0 %   MCV 92.0 78.0 - 100.0 fL   MCH 29.8 26.0 - 34.0 pg   MCHC 32.4 30.0 - 36.0 g/dL   RDW 15.7 (H) 11.5 - 15.5 %   Platelets 40 (L) 150 - 400 K/uL    Comment: CONSISTENT WITH PREVIOUS RESULT  Type and screen [place]     Status: None   Collection Time: 06/21/16  1:10 AM  Result Value Ref Range   ABO/RH(D) A NEG    Antibody Screen NEG    Sample Expiration 06/24/2016   MRSA PCR Screening     Status: None   Collection Time: 06/21/16  1:28 AM  Result Value Ref Range   MRSA by PCR NEGATIVE NEGATIVE    Comment:        The GeneXpert MRSA Assay (FDA approved for NASAL specimens only), is one component of a comprehensive MRSA  colonization surveillance program. It is not intended to diagnose MRSA infection nor to guide or monitor treatment for MRSA infections.   CBC     Status: Abnormal   Collection Time: 06/21/16  7:38 AM  Result Value Ref Range   WBC 2.8 (L) 4.0 - 10.5 K/uL   RBC 3.34 (L) 4.22 - 5.81 MIL/uL   Hemoglobin 9.9 (L) 13.0 - 17.0 g/dL   HCT 30.2 (L) 39.0 - 52.0 %   MCV 90.4 78.0 - 100.0 fL   MCH 29.6 26.0 - 34.0 pg   MCHC 32.8 30.0 - 36.0 g/dL   RDW 15.6 (H) 11.5 - 15.5 %   Platelets 38 (L) 150 - 400 K/uL    Comment: CONSISTENT WITH PREVIOUS RESULT  Occult blood card to lab, stool     Status: None   Collection Time: 06/21/16  8:36 AM  Result Value Ref Range   Fecal Occult Bld NEGATIVE NEGATIVE    Ct Abdomen Pelvis W Contrast  Result Date: 06/20/2016 CLINICAL DATA:  Onset of LEFT-sided flank and abdominal pain 2 days ago, nausea, vomiting, diarrhea, history of fatty liver, GERD, coronary artery disease, obesity, atrial fibrillation, cirrhosis with ascites and varices; post sclerotherapy of gastric varices March 2017  EXAM: CT ABDOMEN AND PELVIS WITH CONTRAST TECHNIQUE: Multidetector CT imaging of the abdomen and pelvis was performed using the standard protocol following bolus administration of intravenous contrast. Sagittal and coronal MPR images reconstructed from axial data set. CONTRAST:  137m ISOVUE-300 IOPAMIDOL (ISOVUE-300) INJECTION 61% IV. No oral contrast was administered. COMPARISON:  03/10/2016, 02/16/2016 FINDINGS: Lower chest:  Lung bases clear Hepatobiliary: Cirrhotic appearing liver with nodular margins. Post cholecystectomy. No definite focal hepatic mass identified. Pancreas: Atrophic without focal mass. Spleen: Splenomegaly, 18.7 x 8.2 x 18.2 cm with calculated volume of 1460 mL. No focal mass lesion. Adrenals/Urinary Tract: Adrenal glands normal appearance. Small nonobstructive RIGHT renal calculi. No evidence of renal mass or hydronephrosis. Unremarkable bladder and ureters.  Stomach/Bowel: Stomach decompressed, suboptimally assessed. Large and small bowel loops grossly normal caliber without obstruction or dilatation. Scattered sigmoid diverticula without definite evidence of diverticulitis. Appendix not definitely visualized. Vascular/Lymphatic: Decreased amount of sclerosis and at site of prior gastric varices embolization. Small varices at splenic hilum. Suspect small amount of chronic thrombus at the confluence of the portal vein, less extensive than on the 02/16/2016 exam. Major vascular structures otherwise grossly patent. Aortic and iliac arterial calcifications. Coronary arterial calcifications. Normal sized retroperitoneal nodes without adenopathy. Reproductive: N/A Other: Small BILATERAL inguinal hernias containing fat, larger on RIGHT. Scattered ascites throughout abdomen and pelvis. Diffuse edema of mesenteric. No free air. Musculoskeletal: No acute osseous findings. IMPRESSION: Cirrhotic liver with splenomegaly and ascites as well as perisplenic varices. Small amount of chronic thrombus within the confluence of the portal vein, less extensive than seen on 02/16/2016. No additional acute intra-abdominal or intrapelvic abnormalities. Small BILATERAL inguinal hernias. Mild distal colonic diverticulosis. Nonobstructive RIGHT renal calculi. Electronically Signed   By: MLavonia DanaM.D.   On: 06/20/2016 21:38    ROS negative except above Blood pressure 122/60, pulse 72, temperature 97.6 F (36.4 C), temperature source Oral, resp. rate 18, height '6\' 3"'  (1.905 m), weight 123.3 kg (271 lb 14.4 oz), SpO2 100 %. Physical Exam vital signs stable afebrile no acute distress lungs are clear heart regular rate and rhythm abdomen is soft very minimal left-sided discomfort no significant pedal edema labs and CT reviewed  Assessment/Plan: Multiple medical problems including cirrhosis on an aspirin a day Plan: Would ask cardiology if he truly needs an aspirin or if his low platelet  count and increased INR would be good enough to decrease his cardiac risks and the risks benefits methods of endoscopy was discussed and will proceed tomorrow at 1:00 with further workup and plans pending those findings and we'll allow clear liquids in the meantime and please call [email or web address] if signs of active bleeding  Goldman Birchall E 06/21/2016, 2:19 PM

## 2016-06-21 NOTE — Progress Notes (Signed)
Patient had mod amt formed brown BM, Hemocult specimen sent to lab  Mervyn Skeeters, RN

## 2016-06-21 NOTE — H&P (Signed)
[facility] Admission History and Physical Service Pager: [phone]  Patient name: Vincent Ochoa Medical record number: [id number] Date of birth: 08-01-1956 Age: 60 y.o. Gender: male  Primary Care Provider: Ralene Ok, MD Consultants: GI in AM Code Status: DNR  Chief Complaint: abdominal pain  Assessment and Plan: Vincent Ochoa is a 60 y.o. male presenting with abdominal pain. PMH is significant for alcoholic cirrhosis, CAD, depression, HFpEF, HLD, OSA.  Hematemesis: With accompanying abdominal pain. Hx variceal bleeds 6 months ago requiring obliteration of varices (Balloon-Occluded Retrograde Transvenous Obliteration of Gastric Varices). Last EGD with large gastric varices with no active bleeding but high risk for re-bleeding. Ct abd in ED showing known cirrhosis with splenomegaly, ascites, and perisplenic varices. Reporting melena as well. VSS and Hgb stable at 10.7. Type and screen already performed.  - Admit to FPTS, attending Dr. Nori Riis - IV Protonix 40 mg BID - GI consult in AM - appreciate recs - Telemetry - NPO for possible GI procedures - Zofran PRN - NS@100cc /hr - AM CBC   Alcoholic cirrhosis: Heavy EtOH use in 1990s. Hx variceal bleeds s/p BRTO in 01/2016. Followed by Dr. Michail Sermon, though not taking prescribed lactulose. Currently with abdominal distention and accompanying pain.  - IV Protonix  - Consult GI   HFrEF: S/p bare metal stent placement in 2015. Last EF 50% (01/2015). Not currently on optimal medical therapy, as only taking ASA, Imdur, and spironolactone (per cardiology note in 01/2016, patient formerly on ASA, Plavix, beta blocker, statin, and Imdur).  - Continue home Lasix, Imdur, spironolactone - Hold ASA given active bleed - Consider resuming beta blocker and statin as recommended by patient's cardiologist    Depression - Continue home bupropion  OSA - CPAP QHS  FEN/GI: NS@100cc /hr, Protonix, NPO  Prophylaxis: only SCDs  given active bleed  Disposition: admit to FPTS  History of Present Illness:  Vincent Ochoa is a 60 y.o. male presenting with abdominal pain and hematemesis.   Patient reports abdominal pain, primarily L-sided, beginning three days ago. Also reports hematemesis beginning around the same time. Reports about 2 episodes of hematemesis a day, with streaks of bright red blood in vomitus. Has been able to tolerate solids and carbonated beverage since onset, however is unable to tolerate non-carbonated fluids. Also endorses melena. Denies hematochezia, constipation, diarrhea.   Also reports SOB. Uses CPAP at night for OSA, however has been using during the day recently as well. SOB has increased with onset of abdominal pain and emesis. Endorses associated sensation of chest pressure, but denies chest pain.   Of note, patient admitted six months ago for variceal bleeds. Required intubation and ICU admission. Received obliteration of varices by IR prior to discharge. Has repeat EGD scheduled in about one week with regular GI, Dr. Michail Sermon.   Review Of Systems: Per HPI with the following additions: endorses malaise. Otherwise the remainder of the systems were negative.  Patient Active Problem List   Diagnosis Date Noted  . Hematemesis 06/21/2016  . Abdominal pain 06/20/2016  . Erectile dysfunction 06/03/2016  . Cirrhosis of liver with ascites ([place]) 02/25/2016  . Dyspnea   . Generalized abdominal pain   . Portal vein thrombosis   . Alcoholic cirrhosis of liver with ascites ([place])   . Bleeding gastric varices   . Esophageal varices with bleeding ([place])   . GI bleed 02/04/2016  . Gastrointestinal hemorrhage 02/04/2016  . Blood loss anemia   . Feeling down 01/10/2016  . Chest pain 01/02/2016  .  Diastolic heart failure ([place]) 10/07/2015  . Leukopenia 09/15/2015  . Acrochordon 07/18/2015  . PAF (paroxysmal atrial fibrillation) ([place])   . Morbid obesity ([place])   . Transaminitis 04/23/2015  . Knee  pain, bilateral 03/05/2015  . Depression 01/08/2015  . OSA (obstructive sleep apnea)   . Physical deconditioning 12/08/2014  . Paroxysmal atrial fibrillation ([place]) 11/05/2014  . Hyperlipidemia LDL goal <70 11/05/2014  . NSTEMI (non-ST elevated myocardial infarction) ([place])   . Back pain, lumbosacral 01/04/2013  . Thrombocytopenia ([place]) 11/29/2012  . Decreased libido 08/15/2011  . GERD (gastroesophageal reflux disease) 08/15/2011  . CAD (coronary artery disease) 07/29/2011  . Psoriasis 07/29/2011    Past Medical History: Past Medical History:  Diagnosis Date  . Back pain, lumbosacral 2014  . Coronary artery disease    a. Evaluated by Dr. Stanford Breed 2012 - nonobstructive 2007-2008 by cath. b. Abnormal stress test 10/2014 but cath deferred temporarily due to thrombocytopenia. c. 10/2014: readm with AF-RVR/NSTEMI - s/p BMS to mLCx 11/04/14 (mod dz in RCA);  d. relook cath 01/2015 and 06/02/2015 patent stent, nonobs CAD, EF 55-65%.  . Elevated LFTs   . Facial cellulitis 2014   Periorbital  . Fatty liver US - 2012  . Frequent headaches   . GERD (gastroesophageal reflux disease)   . Hyperlipidemia   . Morbid obesity ([place])   . Obesity   . OSA (obstructive sleep apnea)    severe with AHI 56/hr with successful CPAP titration to 18cm H2O  . PAF (paroxysmal atrial fibrillation) ([place])    a. Episode 10/2014 at time of NSTEMI, spont converted to NSR. Observation for further episodes (not on anticoag at present time due to [facility] = 1, thrombocytopenia).  . Psoriasis   . Sinus bradycardia   . Thrombocytopenia ([place])    a. Onset unclear, but noted 2012. ? 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 s/p prednisone, IVIG.    Past Surgical History: Past Surgical History:  Procedure Laterality Date  . CARDIAC CATHETERIZATION    . CARDIAC CATHETERIZATION N/A 06/02/2015   Procedure: Left Heart Cath and Coronary Angiography;  Surgeon: Jettie Booze, MD;  Location:  [place] [facility];  Service: Cardiovascular;  Laterality: N/A;  . CHOLECYSTECTOMY  2007  . FACIAL COSMETIC SURGERY  2014  . KNEE ARTHROPLASTY  1970's  . LEFT HEART CATHETERIZATION WITH CORONARY ANGIOGRAM N/A 11/04/2014   Procedure: LEFT HEART CATHETERIZATION WITH CORONARY ANGIOGRAM;  Surgeon: Jettie Booze, MD;  Location[facility] CATH LAB;  Service: Cardiovascular;  Laterality: N/A;  . LEFT HEART CATHETERIZATION WITH CORONARY ANGIOGRAM N/A 01/28/2015   Procedure: LEFT HEART CATHETERIZATION WITH CORONARY ANGIOGRAM;  Surgeon: Sherren Mocha, MD;  Location: [facility] CATH LAB;  Service: Cardiovascular;  Laterality: N/A;  . RADIOLOGY WITH ANESTHESIA N/A 02/04/2016   Procedure: RADIOLOGY WITH ANESTHESIA;  Surgeon: Medication Radiologist, MD;  Location: [place];  Service: Radiology;  Laterality: N/A;  . TONSILLECTOMY  1960's    Social History: Social History  Substance Use Topics  . Smoking status: Former Smoker    Packs/day: 0.20    Years: 43.00    Types: Cigarettes  . Smokeless tobacco: Never Used     Comment: 11/28/2012 "chew occasionally"  Quit for 2.5 months in early 2016  . Alcohol use 0.0 oz/week     Comment: 11/28/2012 "beer q 6 months or so"    Please also refer to relevant sections of EMR.  Family History: Family History  Problem Relation Age of Onset  . Cirrhosis  Mother   . Heart attack Mother   . Hypertension Mother   . Hypertension Father   . Stroke Father   . Arthritis Maternal Grandmother   . Heart disease Sister   . Diabetes Sister   . Cancer - Ovarian Sister   . Heart disease      Maternal/paternal grandparents  . Heart attack Maternal Aunt     Allergies and Medications: No Known Allergies No current facility-administered medications on file prior to encounter.    Current Outpatient Prescriptions on File Prior to Encounter  Medication Sig Dispense Refill  . aspirin 81 MG tablet Take 1 tablet (81 mg total) by mouth daily. 30 tablet 1  . buPROPion (WELLBUTRIN XL) 150 MG  24 hr tablet Take 1 tablet (150 mg total) by mouth daily. 30 tablet 3  . furosemide (LASIX) 80 MG tablet Take 1 tablet (80 mg total) by mouth 2 (two) times daily. 120 tablet 0  . isosorbide mononitrate (IMDUR) 30 MG 24 hr tablet Take 1 tablet (30 mg total) by mouth daily. 30 tablet 1  . nitroGLYCERIN (NITROSTAT) 0.4 MG SL tablet Place 1 tablet (0.4 mg total) under the tongue every 5 (five) minutes as needed for chest pain (up to 3 doses). 25 tablet 3  . pantoprazole (PROTONIX) 40 MG tablet Take 1 tablet (40 mg total) by mouth 2 (two) times daily. 60 tablet 2  . spironolactone (ALDACTONE) 100 MG tablet Take 2 tablets (200 mg total) by mouth daily. 60 tablet 1  . CONSTULOSE 10 GM/15ML solution TAKE 45MLS BY MOUTH TWICE DAILY (Patient not taking: Reported on 06/03/2016) 473 mL 1  . sertraline (ZOLOFT) 50 MG tablet Take 1 tablet (50 mg total) by mouth daily. (Patient not taking: Reported on 06/03/2016) 30 tablet 3  . shark liver oil-cocoa butter (PREPARATION H) 0.25-3-85.5 % suppository Place 1 suppository rectally as needed for hemorrhoids. (Patient not taking: Reported on 06/20/2016) 12 suppository 0    Objective: BP 129/72 (BP Location: Left Arm)   Pulse 89   Temp 98.3 F (36.8 C) (Oral)   Resp 18   Ht 6\' 3"  (1.905 m)   Wt 271 lb 14.4 oz (123.3 kg)   SpO2 98%   BMI 33.99 kg/m  Exam: General: obese male lying in bed in NAD Eyes: PERRLA, EOMI, no scleral icterus ENTM: MMM, poor dentition, no oropharyngeal erythema or exudates Cardiovascular: distant heart sounds, RRR Respiratory: slightly labored WOB on RA, CTAB, no wheezes  Abdomen: distended, tender in L quadrants and at midline, +BS MSK: 1+ pitting edema to knee, +DP pulse bilaterally Skin: warm, dry, no rashes noted Neuro: A&Ox3, no focal deficits Psych: appropriate mood and affect  Labs and Imaging: CBC BMET   Recent Labs Lab 06/20/16 1820  WBC 4.7  HGB 10.7*  HCT 32.5*  PLT 46*    Recent Labs Lab 06/20/16 1820  NA 135   K 3.8  CL 103  CO2 25  BUN 11  CREATININE 1.10  GLUCOSE 97  CALCIUM 8.6*     Ct Abdomen Pelvis W Contrast  Result Date: 06/20/2016 CLINICAL DATA:  Onset of LEFT-sided flank and abdominal pain 2 days ago, nausea, vomiting, diarrhea, history of fatty liver, GERD, coronary artery disease, obesity, atrial fibrillation, cirrhosis with ascites and varices; post sclerotherapy of gastric varices March 2017 EXAM: CT ABDOMEN AND PELVIS WITH CONTRAST TECHNIQUE: Multidetector CT imaging of the abdomen and pelvis was performed using the standard protocol following bolus administration of intravenous contrast. Sagittal and coronal MPR  images reconstructed from axial data set. CONTRAST:  118mL ISOVUE-300 IOPAMIDOL (ISOVUE-300) INJECTION 61% IV. No oral contrast was administered. COMPARISON:  03/10/2016, 02/16/2016 FINDINGS: Lower chest:  Lung bases clear Hepatobiliary: Cirrhotic appearing liver with nodular margins. Post cholecystectomy. No definite focal hepatic mass identified. Pancreas: Atrophic without focal mass. Spleen: Splenomegaly, 18.7 x 8.2 x 18.2 cm with calculated volume of 1460 mL. No focal mass lesion. Adrenals/Urinary Tract: Adrenal glands normal appearance. Small nonobstructive RIGHT renal calculi. No evidence of renal mass or hydronephrosis. Unremarkable bladder and ureters. Stomach/Bowel: Stomach decompressed, suboptimally assessed. Large and small bowel loops grossly normal caliber without obstruction or dilatation. Scattered sigmoid diverticula without definite evidence of diverticulitis. Appendix not definitely visualized. Vascular/Lymphatic: Decreased amount of sclerosis and at site of prior gastric varices embolization. Small varices at splenic hilum. Suspect small amount of chronic thrombus at the confluence of the portal vein, less extensive than on the 02/16/2016 exam. Major vascular structures otherwise grossly patent. Aortic and iliac arterial calcifications. Coronary arterial  calcifications. Normal sized retroperitoneal nodes without adenopathy. Reproductive: N/A Other: Small BILATERAL inguinal hernias containing fat, larger on RIGHT. Scattered ascites throughout abdomen and pelvis. Diffuse edema of mesenteric. No free air. Musculoskeletal: No acute osseous findings. IMPRESSION: Cirrhotic liver with splenomegaly and ascites as well as perisplenic varices. Small amount of chronic thrombus within the confluence of the portal vein, less extensive than seen on 02/16/2016. No additional acute intra-abdominal or intrapelvic abnormalities. Small BILATERAL inguinal hernias. Mild distal colonic diverticulosis. Nonobstructive RIGHT renal calculi. Electronically Signed   By: Lavonia Dana M.D.   On: 06/20/2016 21:38    Verner Mould, MD 06/21/2016, 1:08 AM PGY-2, [place] pager: [phone], text pages welcome

## 2016-06-22 ENCOUNTER — Encounter: Payer: Self-pay | Admitting: Internal Medicine

## 2016-06-22 ENCOUNTER — Encounter (HOSPITAL_COMMUNITY): Payer: Self-pay | Admitting: Gastroenterology

## 2016-06-22 ENCOUNTER — Encounter (HOSPITAL_COMMUNITY): Payer: Self-pay

## 2016-06-22 ENCOUNTER — Encounter (HOSPITAL_COMMUNITY)
Admission: EM | Disposition: A | Discharge status: Home / Self Care | Payer: Self-pay | Source: Home / Self Care | Attending: Family Medicine

## 2016-06-22 HISTORY — PX: ESOPHAGOGASTRODUODENOSCOPY: SHX5428

## 2016-06-22 LAB — COMPREHENSIVE METABOLIC PANEL
ALBUMIN: 2.6 g/dL — AB (ref 3.5–5.0)
ALK PHOS: 84 U/L (ref 38–126)
ALT: 45 U/L (ref 17–63)
ANION GAP: 7 (ref 5–15)
AST: 55 U/L — AB (ref 15–41)
BILIRUBIN TOTAL: 1.7 mg/dL — AB (ref 0.3–1.2)
BUN: 9 mg/dL (ref 6–20)
CALCIUM: 8.4 mg/dL — AB (ref 8.9–10.3)
CO2: 25 mmol/L (ref 22–32)
Chloride: 105 mmol/L (ref 101–111)
Creatinine, Ser: 1.03 mg/dL (ref 0.61–1.24)
GFR calc Af Amer: >60 mL/min (ref >60)
GLUCOSE: 86 mg/dL (ref 65–99)
POTASSIUM: 3.8 mmol/L (ref 3.5–5.1)
Sodium: 137 mmol/L (ref 135–145)
TOTAL PROTEIN: 6.1 g/dL — AB (ref 6.5–8.1)

## 2016-06-22 LAB — CBC
HEMATOCRIT: 30.2 % — AB (ref 39.0–52.0)
HEMOGLOBIN: 10 g/dL — AB (ref 13.0–17.0)
MCH: 29.9 pg (ref 26.0–34.0)
MCHC: 33.1 g/dL (ref 30.0–36.0)
MCV: 90.4 fL (ref 78.0–100.0)
Platelets: 46 10*3/uL — ABNORMAL LOW (ref 150–400)
RBC: 3.34 MIL/uL — ABNORMAL LOW (ref 4.22–5.81)
RDW: 15.5 % (ref 11.5–15.5)
WBC: 2.7 10*3/uL — ABNORMAL LOW (ref 4.0–10.5)

## 2016-06-22 SURGERY — EGD (ESOPHAGOGASTRODUODENOSCOPY)
Anesthesia: Moderate Sedation

## 2016-06-22 MED ORDER — FENTANYL CITRATE (PF) 100 MCG/2ML IJ SOLN
INTRAMUSCULAR | Status: DC | PRN
  Administered 2016-06-22 (×4): 25 ug via INTRAVENOUS

## 2016-06-22 MED ORDER — SIMETHICONE 40 MG/0.6ML PO SUSP
ORAL | Status: AC
  Filled 2016-06-22: qty 0.6

## 2016-06-22 MED ORDER — CARVEDILOL 3.125 MG PO TABS
3.1250 mg | ORAL_TABLET | Freq: Two times a day (BID) | ORAL | Status: DC
  Administered 2016-06-22 – 2016-06-23 (×2): 3.125 mg via ORAL
  Filled 2016-06-22 (×2): qty 1

## 2016-06-22 MED ORDER — FENTANYL CITRATE (PF) 100 MCG/2ML IJ SOLN
INTRAMUSCULAR | Status: AC
  Filled 2016-06-22: qty 2

## 2016-06-22 MED ORDER — ATORVASTATIN CALCIUM 40 MG PO TABS: 40.0000 mg | ORAL_TABLET | Freq: Every day | ORAL | Status: DC

## 2016-06-22 MED ORDER — MIDAZOLAM HCL 5 MG/ML IJ SOLN
INTRAMUSCULAR | Status: AC
  Filled 2016-06-22: qty 2

## 2016-06-22 MED ORDER — MIDAZOLAM HCL 10 MG/2ML IJ SOLN
INTRAMUSCULAR | Status: DC | PRN
  Administered 2016-06-22 (×2): 2 mg via INTRAVENOUS
  Administered 2016-06-22 (×2): 1 mg via INTRAVENOUS

## 2016-06-22 MED ORDER — ATORVASTATIN CALCIUM 40 MG PO TABS
40.0000 mg | ORAL_TABLET | Freq: Every day | ORAL | Status: DC
  Filled 2016-06-22: qty 1

## 2016-06-22 NOTE — Progress Notes (Signed)
Patient with distal esophageal varices with some red spots but no signs of active bleeding status post banding please see procedure note for details but recommend discussing holding his aspirin with cardiology and if doing well tomorrow may advance diet and hopefully discharge soon and follow-up with my partner Dr. Michail Sermon in 3 weeks to decide if repeat endoscopy and possible banding is needed

## 2016-06-22 NOTE — Op Note (Signed)
[facility] Patient Name: Vincent Ochoa Procedure Date : 06/22/2016 MRN: [id number] Attending MD: Clarene Essex , MD Date of Birth: [birth year] CSN: [id number] Age: 60 Admit Type: Inpatient Procedure:                Upper GI endoscopy Indications:              Hematemesis, Melena Providers:                Clarene Essex, MD, Zenon Mayo, RN, [facility]                            Tech, Technician Referring MD:              Medicines:                Fentanyl 100 micrograms IV, Midazolam 6 mg IV,                            Cetacaine spray Complications:            No immediate complications. Estimated Blood Loss:     Estimated blood loss: none. Procedure:                Pre-Anesthesia Assessment:                           - Prior to the procedure, a History and Physical                            was performed, and patient medications and                            allergies were reviewed. The patient's tolerance of                            previous anesthesia was also reviewed. The risks                            and benefits of the procedure and the sedation                            options and risks were discussed with the patient.                            All questions were answered, and informed consent                            was obtained. Prior Anticoagulants: The patient has                            taken aspirin, last dose was 2 days prior to                            procedure. ASA Grade Assessment: II - A patient  with mild systemic disease. After reviewing the                            risks and benefits, the patient was deemed in                            satisfactory condition to undergo the procedure.                           After obtaining informed consent, the endoscope was                            passed under direct vision. Throughout the                            procedure, the patient's blood pressure, pulse, and                             oxygen saturations were monitored continuously. The                            Endoscope was introduced through the mouth, and                            advanced to the second part of duodenum. The upper                            GI endoscopy was accomplished without difficulty.                            The patient tolerated the procedure well. Scope In: Scope Out: Findings:      The larynx was normal.      Grade II varices were found in the distal esophagus. They were small in       size. Three bands were successfully placed [Treatment]. There was no       bleeding during the procedure.      Mild portal hypertensive gastropathy was found in the cardia, in the       gastric fundus, on the greater curvature of the stomach and on the       lesser curvature of the stomach.      A few small semi-sessile polyps with no bleeding and no stigmata of       recent bleeding were found in the gastric antrum.      The duodenal bulb, first portion of the duodenum and second portion of       the duodenum were normal.      The exam was otherwise without abnormality. Impression:               - Normal larynx.                           - Grade II esophageal varices. Banded.                           - Portal hypertensive gastropathy.                           -  A few gastric polyps.                           - Normal duodenal bulb, first portion of the                            duodenum and second portion of the duodenum.                           - The examination was otherwise normal.                           - No specimens collected. Moderate Sedation:      Moderate (conscious) sedation was administered by the endoscopy nurse       and supervised by the endoscopist. The following parameters were       monitored: oxygen saturation, heart rate, blood pressure, respiratory       rate, EKG, adequacy of pulmonary ventilation, and response to care. Recommendation:            - Patient has a contact number available for                            emergencies. The signs and symptoms of potential                            delayed complications were discussed with the                            patient. Return to normal activities tomorrow.                            Written discharge instructions were provided to the                            patient.                           - Clear liquid diet today.                           - Return to GI clinic in 3 weeks.                           - Telephone GI clinic if symptomatic PRN.                           - Continue present medications. Procedure Code(s):        --- Professional ---                           [phone], Esophagogastroduodenoscopy, flexible,                            transoral; with band ligation of esophageal/gastric  varices Diagnosis Code(s):        --- Professional ---                           I85.00, Esophageal varices without bleeding                           K76.6, Portal hypertension                           K31.89, Other diseases of stomach and duodenum                           K31.7, Polyp of stomach and duodenum                           K92.0, Hematemesis                           K92.1, Melena (includes Hematochezia) CPT copyright 2016 American Medical Association. All rights reserved. The codes documented in this report are preliminary and upon coder review may  be revised to meet current compliance requirements. Clarene Essex, MD 06/22/2016 1:41:56 PM This report has been signed electronically. Number of Addenda: 0

## 2016-06-22 NOTE — Progress Notes (Signed)
Endoscopic findings d/w pt's wife, who had called our answering service via the floor nurse.  Pt's wife would like t/c w/ plan regarding any further liver evaluation--she can be reached on Thursday at [phone] (cell--ok to leave vm) or [phone] (work).   Cleotis Nipper, M.D. Pager [phone] If no answer or after 5 PM call [phone]

## 2016-06-22 NOTE — Progress Notes (Signed)
Vincent Ochoa 12:59 PM  Subjective: Patient without any new complaints and no signs of bleeding  Objective: Vital signs stable afebrile exam please see preassessment evaluation labs stable  Assessment: Subacute bleeding in patient with cirrhosis on an aspirin a day  Plan: Okay to proceed with endoscopy with further workup and plans pending those findings  [facility] E  Pager [phone] After 5PM or if no answer call [phone]

## 2016-06-22 NOTE — Progress Notes (Signed)
Family Medicine Teaching Service Daily Progress Note Intern Pager: [phone]  Patient name: Vincent Ochoa Medical record number: [id number] Date of birth: [birth year] Age: 60 y.o. Gender: male  Primary Care Provider: Ralene Ok, MD Consultants: Gastroenterology Code Status: DNR  Pt Overview and Major Events to Date:  8/1 admit for hematemesis  Assessment and Plan: Vincent Ochoa is a 60 y.o. male presenting with abdominal pain. PMH is significant for alcoholic cirrhosis, CAD, depression, HFpEF, HLD, OSA.  Hematemesis: With L abdominal pain and melena. 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 Last EGD 01/2016 with large gastric varices with no active bleeding but high risk for re-bleeding. CT Abd showing known cirrhosis with splenomegaly, ascites, and perisplenic varices. Now Hgb 10.0 - IV Protonix 40 mg BID - Per GI, endoscopy today - Telemetry - Zofran PRN - NS@100cc /hr - monitor CBC   Alcoholic cirrhosis: Heavy EtOH use in 1990s. Hx variceal bleeds s/p BRTO in 01/2016. Followed by Dr. Michail Sermon, though not taking prescribed lactulose. Currently with abdominal distention and accompanying pain. Hepatitis panel neg 02/10/16. - IV Protonix  - GI following, appreciate recommendations  HFrEF with CAD: S/p bare metal stent placement in 2015. Last EF 50% (01/2015). Not currently on optimal medical therapy, as only taking ASA, Imdur, and spironolactone (per cardiology note in 01/2016, patient formerly on ASA, Plavix, beta blocker, statin, and Imdur). metoprolol stopped in 02/2016 due to hypotension. - Continue home Lasix, Imdur, spironolactone - Hold ASA given active bleed - restart betablocker per cardiology, try coreg 3.125mg  since did not tolerate metoprolol d/t low BPs - restart atorvastatin after EGD today since LFTs improving.   Chronic portal vein thrombosis Found on CT imaging during last admission on march  2017, GI was consulted at that time and recommended that the risks of anticoagulation outweighed the benefits given his significant bleed history.  Depression - Continue home bupropion  OSA - CPAP QHS  FEN/GI: NS@100cc /hr, Protonix, NPO  Prophylaxis: only SCDs given active bleed  Disposition: pending  Subjective:  States feels well this am overall. Has not had further episodes of hematemesis since admit. Feels LUQ abd pain is continuing to improve but still persisting. Feels hungry, looking forward to eating  Objective: Temp:  [97.6 F (36.4 C)-98.5 F (36.9 C)] 98.1 F (36.7 C) (08/02 0449) Pulse Rate:  [72-87] 79 (08/02 0449) Resp:  [18] 18 (08/02 0449) BP: (101-122)/(51-64) 101/51 (08/02 0449) SpO2:  [96 %-100 %] 96 % (08/02 0449) Physical Exam:  General: obese male lying in bed in NAD Cardiovascular: distant heart sounds, RRR Respiratory: slightly labored WOB with speech on RA, CTAB, no wheezes  Abdomen: distended, mildly tender in LUQ, +BS. No rebound or guarding. Umbilical hernia at midline that is reducible and mildly TTP. Extremities: 1+ pitting edema to knee, +DP pulse bilaterally  Laboratory:  Recent Labs Lab 06/21/16 0104 06/21/16 0738 06/22/16 0224  WBC 3.9* 2.8* 2.7*  HGB 10.4* 9.9* 10.0*  HCT 32.1* 30.2* 30.2*  PLT 40* 38* 46*    Recent Labs Lab 06/20/16 1820 06/22/16 0224  NA 135 137  K 3.8 3.8  CL 103 105  CO2 25 25  BUN 11 9  CREATININE 1.10 1.03  CALCIUM 8.6* 8.4*  PROT 6.7 6.1*  BILITOT 1.9* 1.7*  ALKPHOS 92 84  ALT 51 45  AST 66* 55*  GLUCOSE 97 86   Trop 0.00 Lactic acid 1.36    Imaging/Diagnostic Tests: Ct Abdomen Pelvis W Contrast  Result  Date: 06/20/2016 CLINICAL DATA:  Onset of LEFT-sided flank and abdominal pain 2 days ago, nausea, vomiting, diarrhea, history of fatty liver, GERD, coronary artery disease, obesity, atrial fibrillation, cirrhosis with ascites and varices; post sclerotherapy of gastric varices March  2017 EXAM: CT ABDOMEN AND PELVIS WITH CONTRAST TECHNIQUE: Multidetector CT imaging of the abdomen and pelvis was performed using the standard protocol following bolus administration of intravenous contrast. Sagittal and coronal MPR images reconstructed from axial data set. CONTRAST:  19mL ISOVUE-300 IOPAMIDOL (ISOVUE-300) INJECTION 61% IV. No oral contrast was administered. COMPARISON:  03/10/2016, 02/16/2016 FINDINGS: Lower chest:  Lung bases clear Hepatobiliary: Cirrhotic appearing liver with nodular margins. Post cholecystectomy. No definite focal hepatic mass identified. Pancreas: Atrophic without focal mass. Spleen: Splenomegaly, 18.7 x 8.2 x 18.2 cm with calculated volume of 1460 mL. No focal mass lesion. Adrenals/Urinary Tract: Adrenal glands normal appearance. Small nonobstructive RIGHT renal calculi. No evidence of renal mass or hydronephrosis. Unremarkable bladder and ureters. Stomach/Bowel: Stomach decompressed, suboptimally assessed. Large and small bowel loops grossly normal caliber without obstruction or dilatation. Scattered sigmoid diverticula without definite evidence of diverticulitis. Appendix not definitely visualized. Vascular/Lymphatic: Decreased amount of sclerosis and at site of prior gastric varices embolization. Small varices at splenic hilum. Suspect small amount of chronic thrombus at the confluence of the portal vein, less extensive than on the 02/16/2016 exam. Major vascular structures otherwise grossly patent. Aortic and iliac arterial calcifications. Coronary arterial calcifications. Normal sized retroperitoneal nodes without adenopathy. Reproductive: N/A Other: Small BILATERAL inguinal hernias containing fat, larger on RIGHT. Scattered ascites throughout abdomen and pelvis. Diffuse edema of mesenteric. No free air. Musculoskeletal: No acute osseous findings. IMPRESSION: Cirrhotic liver with splenomegaly and ascites as well as perisplenic varices. Small amount of chronic thrombus  within the confluence of the portal vein, less extensive than seen on 02/16/2016. No additional acute intra-abdominal or intrapelvic abnormalities. Small BILATERAL inguinal hernias. Mild distal colonic diverticulosis. Nonobstructive RIGHT renal calculi. Electronically Signed   By: Lavonia Dana M.D.   On: 06/20/2016 21:38    Bufford Lope, DO 06/22/2016, 6:38 AM PGY-1, [place] Intern pager: [phone], text pages welcome

## 2016-06-23 ENCOUNTER — Ambulatory Visit: Payer: Medicaid Other

## 2016-06-23 LAB — CBC WITH DIFFERENTIAL/PLATELET
BASOS PCT: 0 %
Basophils Absolute: 0 10*3/uL (ref 0.0–0.1)
Eosinophils Absolute: 0.1 10*3/uL (ref 0.0–0.7)
Eosinophils Relative: 4 %
HEMATOCRIT: 31.2 % — AB (ref 39.0–52.0)
HEMOGLOBIN: 10.3 g/dL — AB (ref 13.0–17.0)
LYMPHS ABS: 0.8 10*3/uL (ref 0.7–4.0)
Lymphocytes Relative: 26 %
MCH: 29.9 pg (ref 26.0–34.0)
MCHC: 33 g/dL (ref 30.0–36.0)
MCV: 90.4 fL (ref 78.0–100.0)
MONO ABS: 0.5 10*3/uL (ref 0.1–1.0)
MONOS PCT: 17 %
NEUTROS ABS: 1.6 10*3/uL — AB (ref 1.7–7.7)
NEUTROS PCT: 53 %
Platelets: 52 10*3/uL — ABNORMAL LOW (ref 150–400)
RBC: 3.45 MIL/uL — ABNORMAL LOW (ref 4.22–5.81)
RDW: 15.5 % (ref 11.5–15.5)
WBC: 3 10*3/uL — ABNORMAL LOW (ref 4.0–10.5)

## 2016-06-23 LAB — BASIC METABOLIC PANEL
ANION GAP: 6 (ref 5–15)
BUN: 10 mg/dL (ref 6–20)
CALCIUM: 8.5 mg/dL — AB (ref 8.9–10.3)
CO2: 27 mmol/L (ref 22–32)
Chloride: 102 mmol/L (ref 101–111)
Creatinine, Ser: 1.07 mg/dL (ref 0.61–1.24)
GFR calc non Af Amer: >60 mL/min (ref >60)
GLUCOSE: 98 mg/dL (ref 65–99)
Potassium: 4 mmol/L (ref 3.5–5.1)
Sodium: 135 mmol/L (ref 135–145)

## 2016-06-23 MED ORDER — ATORVASTATIN CALCIUM 40 MG PO TABS: 40.0000 mg | ORAL_TABLET | Freq: Every day | ORAL | 0 refills | Status: DC

## 2016-06-23 MED ORDER — CARVEDILOL 3.125 MG PO TABS: 3.1250 mg | ORAL_TABLET | Freq: Two times a day (BID) | ORAL | 0 refills | Status: DC

## 2016-06-23 NOTE — Progress Notes (Signed)
Family Medicine Teaching Service Daily Progress Note Intern Pager: [phone]  Patient name: Vincent Ochoa Medical record number: [id number] Date of birth: Mar 30, 1956 Age: 60 y.o. Gender: male  Primary Care Provider: Ralene Ok, MD Consultants: Gastroenterology Code Status: DNR  Pt Overview and Major Events to Date:  Vincent Ochoa a 60 y.o.malepresenting with abdominal pain. PMH is significant for alcoholic cirrhosis, CAD, depression, HFpEF, HLD, OSA.  Assessment and Plan: Hematemesis:With L abdominal pain and melena. 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 Last EGD 01/2016 with large gastric varices with no active bleeding but high risk for re-bleeding. CT Abd showing known cirrhosis with splenomegaly, ascites, and perisplenic varices. Now Hgb 10.3 - IV Protonix 40 mg BID - Endoscopy yesterday (8/2), banded 3 varices with spots of blood but no active bleeding - Telemetry - Zofran PRN - NS@100cc /hr - monitor CBC  - patient will be advanced to regular diet and plan for DC today   Alcoholic cirrhosis:Heavy EtOH use in 1990s. Hx variceal bleeds s/p BRTO in 01/2016. Followed by Dr. Michail Sermon, though not taking prescribed lactulose. Currently with abdominal distention and accompanying pain. Hepatitis panel neg 02/10/16. - IV Protonix  - GI on board  HFrEF with CAD:S/p bare metal stent placement in 2015. Last EF 50% (01/2015). Not currently on optimal medical therapy, as only taking ASA, Imdur, and spironolactone (per cardiology note in 01/2016, patient formerly on ASA, Plavix, beta blocker, statin, and Imdur).  - Continue home Lasix, Imdur, spironolactone - Hold ASA given active bleed, will continue to with hold at discharge - Consider resuming beta blocker and statin as recommended by patient's cardiologist   Chronic portal vein thrombosis Found on CT imaging during last admission on march 2017, GI was  consulted at that time and recommended that the risks of anticoagulation outweighed the benefits given his significant bleed history.  Depression - Continue home bupropion  OSA - CPAP QHS  FEN/GI: NS@100cc /hr, Protonix, NPO  Prophylaxis: only SCDs given active bleed    Subjective:  Patient feels better the same. Reports some epigastric pain but improved from yesterday.  Denies weakness ,shortness of breath, or pain.  Has not had a BM.  Objective: Temp:  [97.6 F (36.4 C)-98.3 F (36.8 C)] 97.6 F (36.4 C) (08/03 0556) Pulse Rate:  [74-86] 74 (08/03 0556) Resp:  [9-18] 18 (08/03 0556) BP: (106-172)/(48-102) 110/68 (08/03 0556) SpO2:  [95 %-100 %] 100 % (08/03 0556) Physical Exam: General: NAD Cardiovascular: RRR, no murmurs rubs or gallops Respiratory: CTA bilaterally, no wheezes rhonchi or rales Abdomen: +mild epigastric pain to palpation, soft, and nondistended, normoactive bowel sounds Extremities: no edema, warm and well-perfused  Laboratory:  Recent Labs Lab 06/21/16 0738 06/22/16 0224 06/23/16 0321  WBC 2.8* 2.7* 3.0*  HGB 9.9* 10.0* 10.3*  HCT 30.2* 30.2* 31.2*  PLT 38* 46* 52*    Recent Labs Lab 06/20/16 1820 06/22/16 0224 06/23/16 0321  NA 135 137 135  K 3.8 3.8 4.0  CL 103 105 102  CO2 25 25 27   BUN 11 9 10   CREATININE 1.10 1.03 1.07  CALCIUM 8.6* 8.4* 8.5*  PROT 6.7 6.1*  --   BILITOT 1.9* 1.7*  --   ALKPHOS 92 84  --   ALT 51 45  --   AST 66* 55*  --   GLUCOSE 97 86 98     Imaging/Diagnostic Tests: No results found.    Upper endoscopy 06/22/2016 Findings: The larynx was normal. Grade  II varices were found in the distal esophagus. They were small in size. Three bands were successfully placed [Treatment]. There was no bleeding during the procedure. Mild portal hypertensive gastropathy was found in the cardia, in the gastric fundus, on the greater curvature of the stomach and on the lesser curvature of the stomach. A few small  semi-sessile polyps with no bleeding and no stigmata of recent bleeding were found in the gastric antrum. The duodenal bulb, first portion of the duodenum and second portion of the duodenum were normal. The exam was otherwise without abnormality.   Everrett Coombe, MD 06/23/2016, 12:22 PM PGY-1, [place] Intern pager: [phone], text pages welcome

## 2016-06-23 NOTE — Care Management Note (Signed)
Case Management Note  Patient Details  Name: Vincent Ochoa MRN: [id number] Date of Birth: Sep 26, 1956  Subjective/Objective:    Alcohol cirrohsis                Action/Plan: Discharge Planning: AVS reviewed: Chart reviewed. No NCM needs identified. Can afford her medications.  Expected Discharge Date:                  Expected Discharge Plan:  Home/Self Care  In-House Referral:  NA  Discharge planning Services  CM Consult  Post Acute Care Choice:  NA Choice offered to:  NA  DME Arranged:  N/A DME Agency:  NA  HH Arranged:  NA HH Agency:  NA  Status of Service:  Completed, signed off  If discussed at [place] of Stay Meetings, dates discussed:    Additional Comments:  Erenest Rasher, RN 06/23/2016, 3:11 PM

## 2016-06-23 NOTE — Progress Notes (Signed)
Vincent Ochoa 12:48 PM  Subjective: Patient without any complaints or any obvious problems from his procedure which we discussed with he and his wife and we answered all of her questions and he has no signs of liver disease and is tolerating regular food and he has no new complaints and he says his bowel movements are back to normal and are not black  Objective: Vital signs stable afebrile no acute distress abdomen is soft nontender hemoglobin stable  Assessment: Cirrhosis with self-limited GI bleeding on an aspirin a day which we discussed  Plan: Okay with me to go home need cardiology's opinion regarding aspirin but hopefully it can be decreased or stopped as per my last note and he has an appointment next week with my partner Dr. Michail Sermon and would probably recommend repeat endoscopy in one month with additional banding if needed and please call me if I could be of any further assistance with this hospital stay [facility]  Pager [phone] After 5PM or if no answer call [phone]

## 2016-06-30 ENCOUNTER — Ambulatory Visit (INDEPENDENT_AMBULATORY_CARE_PROVIDER_SITE_OTHER): Payer: Medicaid Other | Admitting: Internal Medicine

## 2016-06-30 ENCOUNTER — Encounter: Payer: Self-pay | Admitting: Internal Medicine

## 2016-06-30 VITALS — Ht 75.0 in | Wt 268.6 lb

## 2016-06-30 DIAGNOSIS — E669 Obesity, unspecified: Secondary | ICD-10-CM | POA: Diagnosis not present

## 2016-06-30 NOTE — Patient Instructions (Addendum)
Dr. Jenne Campus: [phone]  October 12th, 10 AM - For 60 minutes  Goals  1. Exercise for 1 hour a day for at least 5 days of the week (swimming and walking)  2. Continue 3 regular meals daily with protein, fresh vegetables  Week of __________ Goals SUN MON TUE WED THU FRI SAT  Eat 3 regular meals         Exercise 5 X wk 60 mins           Week of __________ Goals SUN MON TUE WED THU FRI SAT  Eat 3 regular meals         Exercise 5 X wk 60 mins           Week of __________ Goals SUN MON TUE WED THU FRI SAT  Eat 3 regular meals         Exercise 5 X wk 60 mins           Week of __________ Goals SUN MON TUE WED THU FRI SAT  Eat 3 regular meals         Exercise 5 X wk 60 mins           Week of __________ Goals SUN MON TUE WED THU FRI SAT  Eat 3 regular meals         Exercise 5 X wk 60 mins            Week of __________ Goals SUN MON TUE WED THU FRI SAT  Eat 3 regular meals         Exercise 5 X wk 60 mins           Week of __________ Goals SUN MON TUE WED THU FRI SAT  Eat 3 regular meals         Exercise 5 X wk 60 mins           Week of __________  Goals SUN MON TUE WED THU FRI SAT  Eat 3 regular meals         Exercise 5 X wk 60 mins           Week of __________ Goals SUN MON TUE WED THU FRI SAT  Eat 3 regular meals         Exercise 5 X wk 60 mins           Week of __________ Goals SUN MON TUE WED THU FRI SAT  Eat 3 regular meals         Exercise 5 X wk 60 mins           Week of __________ Goals SUN MON TUE WED THU FRI SAT  Eat 3 regular meals         Exercise 5 X wk 60 mins

## 2016-06-30 NOTE — Progress Notes (Addendum)
Medical Nutrition Therapy:  Appt start time: 1000 end time:  1030.  Assessment:  Primary concerns today: Weight management.   Learning Readiness:   Change in progress  Visit was limited by patient's time constraints. States he usual eating pattern includes 3 meals.  24-hr recall: (Up at  AM) B ( AM)- 1 Muffin, 2 cups of 2%  milk   Snk ( AM)- Carrots and celery    L ( PM)- Pita Bread with vegetables and lamb; Water Snk ( PM)-  None  D ( PM)- 1 Cup Fruit Yogurt, Carrot/peaches/Peas   Snk ( PM)- 1 Gravy and 1 Biscuit, Water   Typical day? No. Patient was traveling back from [place].   Progress Towards Goal(s):  Some progress. Did not bring goal chart today.  Previous Goals  Start regular exercise schedule everyday - try 7 days a week. Remember 5 minute exercise rule. -  States he exercise most days, except on the days that he is traveling. He is currently tracking steps: 2000 steps on avg daily. Patient not able to exercise during travel days. Also been swimming at the gym.  Continue 3 regular meals daily with protein, fresh vegetables - meeting this goal   Nutritional Diagnosis:  Coal-3.3 Overweight/obesity As related to BMI 33.     Intervention:  Nutrition and Exercise Goals - Goal wt down to 210 lbs.  1. Exercise for 1 hour a day for at least 5 days of the week (swimming and/or walking)  2. Continue 3 regular meals daily with protein, fresh vegetables  Handouts given during visit include:  AVS  Demonstrated degree of understanding via:  Teach Back  Barriers to learning/adherence to lifestyle change: Social concern - relationship with his signficant other. Hx of Depression. PTSD ([place] veteran)  Monitoring/Evaluation:  Dietary intake, exercise, and body weight prn.

## 2016-07-04 ENCOUNTER — Encounter (HOSPITAL_COMMUNITY): Payer: Self-pay | Admitting: *Deleted

## 2016-07-04 ENCOUNTER — Inpatient Hospital Stay (HOSPITAL_COMMUNITY)
Admission: EM | Admit: 2016-07-04 | Discharge: 2016-07-05 | DRG: 368 | Disposition: A | Discharge status: Home / Self Care | Payer: Medicaid Other | Attending: Family Medicine | Admitting: Family Medicine

## 2016-07-04 DIAGNOSIS — K920 Hematemesis: Secondary | ICD-10-CM | POA: Diagnosis present

## 2016-07-04 DIAGNOSIS — T63301A Toxic effect of unspecified spider venom, accidental (unintentional), initial encounter: Secondary | ICD-10-CM | POA: Diagnosis present

## 2016-07-04 DIAGNOSIS — F329 Major depressive disorder, single episode, unspecified: Secondary | ICD-10-CM | POA: Diagnosis present

## 2016-07-04 DIAGNOSIS — I2510 Atherosclerotic heart disease of native coronary artery without angina pectoris: Secondary | ICD-10-CM | POA: Diagnosis present

## 2016-07-04 DIAGNOSIS — Z8041 Family history of malignant neoplasm of ovary: Secondary | ICD-10-CM | POA: Diagnosis not present

## 2016-07-04 DIAGNOSIS — I10 Essential (primary) hypertension: Secondary | ICD-10-CM | POA: Diagnosis not present

## 2016-07-04 DIAGNOSIS — I110 Hypertensive heart disease with heart failure: Secondary | ICD-10-CM | POA: Diagnosis present

## 2016-07-04 DIAGNOSIS — Z9049 Acquired absence of other specified parts of digestive tract: Secondary | ICD-10-CM

## 2016-07-04 DIAGNOSIS — B9562 Methicillin resistant Staphylococcus aureus infection as the cause of diseases classified elsewhere: Secondary | ICD-10-CM | POA: Diagnosis present

## 2016-07-04 DIAGNOSIS — I252 Old myocardial infarction: Secondary | ICD-10-CM | POA: Diagnosis not present

## 2016-07-04 DIAGNOSIS — Z8249 Family history of ischemic heart disease and other diseases of the circulatory system: Secondary | ICD-10-CM | POA: Diagnosis not present

## 2016-07-04 DIAGNOSIS — R110 Nausea: Secondary | ICD-10-CM | POA: Diagnosis not present

## 2016-07-04 DIAGNOSIS — K219 Gastro-esophageal reflux disease without esophagitis: Secondary | ICD-10-CM | POA: Diagnosis present

## 2016-07-04 DIAGNOSIS — K766 Portal hypertension: Secondary | ICD-10-CM | POA: Diagnosis present

## 2016-07-04 DIAGNOSIS — I5022 Chronic systolic (congestive) heart failure: Secondary | ICD-10-CM | POA: Diagnosis present

## 2016-07-04 DIAGNOSIS — D696 Thrombocytopenia, unspecified: Secondary | ICD-10-CM | POA: Diagnosis present

## 2016-07-04 DIAGNOSIS — I959 Hypotension, unspecified: Secondary | ICD-10-CM | POA: Diagnosis not present

## 2016-07-04 DIAGNOSIS — I8500 Esophageal varices without bleeding: Secondary | ICD-10-CM | POA: Diagnosis present

## 2016-07-04 DIAGNOSIS — K7581 Nonalcoholic steatohepatitis (NASH): Secondary | ICD-10-CM | POA: Diagnosis present

## 2016-07-04 DIAGNOSIS — I480 Paroxysmal atrial fibrillation: Secondary | ICD-10-CM | POA: Diagnosis present

## 2016-07-04 DIAGNOSIS — I8501 Esophageal varices with bleeding: Principal | ICD-10-CM | POA: Diagnosis present

## 2016-07-04 DIAGNOSIS — L03114 Cellulitis of left upper limb: Secondary | ICD-10-CM | POA: Diagnosis present

## 2016-07-04 DIAGNOSIS — I81 Portal vein thrombosis: Secondary | ICD-10-CM | POA: Diagnosis present

## 2016-07-04 DIAGNOSIS — E785 Hyperlipidemia, unspecified: Secondary | ICD-10-CM | POA: Diagnosis present

## 2016-07-04 DIAGNOSIS — I5042 Chronic combined systolic (congestive) and diastolic (congestive) heart failure: Secondary | ICD-10-CM

## 2016-07-04 DIAGNOSIS — G4733 Obstructive sleep apnea (adult) (pediatric): Secondary | ICD-10-CM | POA: Diagnosis present

## 2016-07-04 DIAGNOSIS — K5900 Constipation, unspecified: Secondary | ICD-10-CM | POA: Diagnosis present

## 2016-07-04 DIAGNOSIS — Z955 Presence of coronary angioplasty implant and graft: Secondary | ICD-10-CM

## 2016-07-04 DIAGNOSIS — Z87891 Personal history of nicotine dependence: Secondary | ICD-10-CM

## 2016-07-04 DIAGNOSIS — K7030 Alcoholic cirrhosis of liver without ascites: Secondary | ICD-10-CM | POA: Diagnosis present

## 2016-07-04 DIAGNOSIS — Z823 Family history of stroke: Secondary | ICD-10-CM

## 2016-07-04 HISTORY — DX: Dependence on other enabling machines and devices: Z99.89

## 2016-07-04 HISTORY — DX: Obstructive sleep apnea (adult) (pediatric): G47.33

## 2016-07-04 HISTORY — DX: Unspecified osteoarthritis, unspecified site: M19.90

## 2016-07-04 HISTORY — DX: Personal history of other medical treatment: Z92.89

## 2016-07-04 HISTORY — DX: Cerebral infarction, unspecified: I63.9

## 2016-07-04 LAB — COMPREHENSIVE METABOLIC PANEL
ALT: 47 U/L (ref 17–63)
AST: 62 U/L — ABNORMAL HIGH (ref 15–41)
Albumin: 3.2 g/dL — ABNORMAL LOW (ref 3.5–5.0)
Alkaline Phosphatase: 90 U/L (ref 38–126)
Anion gap: 5 (ref 5–15)
BUN: 13 mg/dL (ref 6–20)
CALCIUM: 9.2 mg/dL (ref 8.9–10.3)
CHLORIDE: 104 mmol/L (ref 101–111)
CO2: 27 mmol/L (ref 22–32)
CREATININE: 1.08 mg/dL (ref 0.61–1.24)
Glucose, Bld: 128 mg/dL — ABNORMAL HIGH (ref 65–99)
Potassium: 4.5 mmol/L (ref 3.5–5.1)
Sodium: 136 mmol/L (ref 135–145)
TOTAL PROTEIN: 7.1 g/dL (ref 6.5–8.1)
Total Bilirubin: 1.6 mg/dL — ABNORMAL HIGH (ref 0.3–1.2)

## 2016-07-04 LAB — CBC
HCT: 34.9 % — ABNORMAL LOW (ref 39.0–52.0)
HEMATOCRIT: 31.4 % — AB (ref 39.0–52.0)
HEMATOCRIT: 30.6 % — AB (ref 39.0–52.0)
HEMOGLOBIN: 9.9 g/dL — AB (ref 13.0–17.0)
Hemoglobin: 11.5 g/dL — ABNORMAL LOW (ref 13.0–17.0)
Hemoglobin: 10.4 g/dL — ABNORMAL LOW (ref 13.0–17.0)
MCH: 30.3 pg (ref 26.0–34.0)
MCH: 30.4 pg (ref 26.0–34.0)
MCH: 30 pg (ref 26.0–34.0)
MCHC: 33 g/dL (ref 30.0–36.0)
MCHC: 33.1 g/dL (ref 30.0–36.0)
MCHC: 32.4 g/dL (ref 30.0–36.0)
MCV: 91.8 fL (ref 78.0–100.0)
MCV: 91.8 fL (ref 78.0–100.0)
MCV: 92.7 fL (ref 78.0–100.0)
PLATELETS: 46 10*3/uL — AB (ref 150–400)
Platelets: 61 10*3/uL — ABNORMAL LOW (ref 150–400)
Platelets: 41 10*3/uL — ABNORMAL LOW (ref 150–400)
RBC: 3.8 MIL/uL — AB (ref 4.22–5.81)
RBC: 3.42 MIL/uL — AB (ref 4.22–5.81)
RBC: 3.3 MIL/uL — ABNORMAL LOW (ref 4.22–5.81)
RDW: 15.9 % — ABNORMAL HIGH (ref 11.5–15.5)
RDW: 15.9 % — ABNORMAL HIGH (ref 11.5–15.5)
RDW: 15.9 % — AB (ref 11.5–15.5)
WBC: 5.8 10*3/uL (ref 4.0–10.5)
WBC: 3.6 10*3/uL — ABNORMAL LOW (ref 4.0–10.5)
WBC: 3.1 10*3/uL — AB (ref 4.0–10.5)

## 2016-07-04 LAB — LIPASE, BLOOD: LIPASE: 32 U/L (ref 11–51)

## 2016-07-04 MED ORDER — ATORVASTATIN CALCIUM 40 MG PO TABS
40.0000 mg | ORAL_TABLET | Freq: Every day | ORAL | Status: DC
  Administered 2016-07-04: 40 mg via ORAL
  Filled 2016-07-04: qty 1

## 2016-07-04 MED ORDER — SODIUM CHLORIDE 0.9 % IV SOLN: INTRAVENOUS | Status: DC

## 2016-07-04 MED ORDER — CLINDAMYCIN PHOSPHATE 600 MG/50ML IV SOLN
600.0000 mg | Freq: Three times a day (TID) | INTRAVENOUS | Status: DC
  Administered 2016-07-04 – 2016-07-05 (×3): 600 mg via INTRAVENOUS
  Filled 2016-07-04 (×3): qty 50

## 2016-07-04 MED ORDER — SODIUM CHLORIDE 0.9 % IV SOLN
INTRAVENOUS | Status: DC
  Administered 2016-07-04 – 2016-07-05 (×3): via INTRAVENOUS

## 2016-07-04 MED ORDER — CLINDAMYCIN PHOSPHATE 600 MG/50ML IV SOLN
600.0000 mg | Freq: Once | INTRAVENOUS | Status: AC
  Administered 2016-07-04: 600 mg via INTRAVENOUS
  Filled 2016-07-04: qty 50

## 2016-07-04 MED ORDER — ONDANSETRON HCL 4 MG/2ML IJ SOLN: 4.0000 mg | Freq: Four times a day (QID) | INTRAMUSCULAR | Status: DC | PRN

## 2016-07-04 MED ORDER — CARVEDILOL 3.125 MG PO TABS
3.1250 mg | ORAL_TABLET | Freq: Two times a day (BID) | ORAL | Status: DC
  Administered 2016-07-04 – 2016-07-05 (×3): 3.125 mg via ORAL
  Filled 2016-07-04 (×4): qty 1

## 2016-07-04 MED ORDER — HYDROMORPHONE HCL 1 MG/ML IJ SOLN
1.0000 mg | Freq: Once | INTRAMUSCULAR | Status: AC
  Administered 2016-07-04: 1 mg via INTRAVENOUS
  Filled 2016-07-04: qty 1

## 2016-07-04 MED ORDER — SODIUM CHLORIDE 0.9 % IV BOLUS (SEPSIS)
1000.0000 mL | Freq: Once | INTRAVENOUS | Status: AC
  Administered 2016-07-04: 1000 mL via INTRAVENOUS

## 2016-07-04 MED ORDER — MORPHINE SULFATE (PF) 4 MG/ML IV SOLN
4.0000 mg | Freq: Once | INTRAVENOUS | Status: AC
  Administered 2016-07-04: 4 mg via INTRAVENOUS
  Filled 2016-07-04: qty 1

## 2016-07-04 MED ORDER — ISOSORBIDE MONONITRATE ER 30 MG PO TB24
30.0000 mg | ORAL_TABLET | Freq: Every day | ORAL | Status: DC
  Administered 2016-07-04: 30 mg via ORAL
  Filled 2016-07-04: qty 1

## 2016-07-04 MED ORDER — ONDANSETRON HCL 4 MG/2ML IJ SOLN
4.0000 mg | Freq: Once | INTRAMUSCULAR | Status: AC
  Administered 2016-07-04: 4 mg via INTRAVENOUS
  Filled 2016-07-04: qty 2

## 2016-07-04 MED ORDER — SODIUM CHLORIDE 0.9 % IV BOLUS (SEPSIS)
250.0000 mL | Freq: Once | INTRAVENOUS | Status: AC
  Administered 2016-07-04: 250 mL via INTRAVENOUS

## 2016-07-04 MED ORDER — SUCRALFATE 1 GM/10ML PO SUSP
1.0000 g | Freq: Three times a day (TID) | ORAL | Status: DC
  Administered 2016-07-04 – 2016-07-05 (×5): 1 g via ORAL
  Filled 2016-07-04 (×5): qty 10

## 2016-07-04 MED ORDER — PROMETHAZINE HCL 25 MG/ML IJ SOLN
25.0000 mg | Freq: Once | INTRAMUSCULAR | Status: AC
  Administered 2016-07-04: 25 mg via INTRAVENOUS
  Filled 2016-07-04 (×2): qty 1

## 2016-07-04 MED ORDER — BUPROPION HCL ER (XL) 150 MG PO TB24
150.0000 mg | ORAL_TABLET | Freq: Every day | ORAL | Status: DC
  Administered 2016-07-04 – 2016-07-05 (×2): 150 mg via ORAL
  Filled 2016-07-04 (×2): qty 1

## 2016-07-04 MED ORDER — FAMOTIDINE IN NACL 20-0.9 MG/50ML-% IV SOLN
20.0000 mg | Freq: Two times a day (BID) | INTRAVENOUS | Status: DC
  Administered 2016-07-04 – 2016-07-05 (×3): 20 mg via INTRAVENOUS
  Filled 2016-07-04 (×4): qty 50

## 2016-07-04 NOTE — ED Triage Notes (Signed)
The pt arrived by gems from home.  C/o a insect bite to gis lt hand dorsally  Red swollen and he also wants to be seen for vomiting and he saw some bright red blood in the emesis

## 2016-07-04 NOTE — H&P (Signed)
[facility] Admission History and Physical Service Pager: [phone]  Patient name: Vincent Ochoa Medical record number: [id number] Date of birth: 1956-10-14 Age: 60 y.o. Gender: male  Primary Care Provider: Ralene Ok, MD Consultants: None Code Status: FULL  Chief Complaint: hematemesis  Assessment and Plan: Vincent Ochoa is a 60 y.o. male presenting with 1 day of hematemesis and abdominal pain, as well as left hand cellulitis secondary to apparent bug bite.    PMH is significant for alcoholic cirrhosis, CAD, depression, HFpEF, HLD, OSA.  Hematemesis: With accompanying abdominal pain.  Hx variceal bleeds 6 months ago requiring obliteration of varices (Balloon-Occluded Retrograde Transvenous Obliteration of Gastric Varices). Patient discharged on 8/1 for hematemesis. During that hospitalization GI was consulted. EGD performed which showed distal esophageal varies without signs of active bleeding post banding.  Reporting melena as well. VSS and Hgb stable at 11.5. Type and screen already performed (A-). Lipase WNL at 32. Patient has not yet had outpatient f/u with GI since recent discharge.  GI recommended repeat endoscopy in one month with additional banding if needed.  - Admit to FPTS, attending Dr. Mingo Amber - Pepcid IVPB 20 mg BID - plan for GI consult in AM - appreciate recs - NPO  - Zofran PRN - NS@100cc /hr - repeat CBC in process, will obtain another repeat 6 hours later  -insert 2nd large bore PIV   Cellulitis of the left hand - likely secondary to insect bite. Warm, red, painful, with apparent puncture site.  Patient is afebrile and with stable vital signs. - continue IV clindamycin, started in ED, de-escalate to PO antibiotics as soon as patient tolerating PO  - observe for improvement, induration outlined with skin marker   Alcoholic cirrhosis: Heavy EtOH use in 1990s. Hx variceal bleeds s/p BRTO in 01/2016, variceal banding 06/2016. Followed by  Dr. Michail Sermon. Currently with abdominal distention and accompanying pain.  - IV Pepcid - Consult GI   HFrEF: S/p bare metal stent placement in 2015. Last EF 50% (01/2015). Currently on Coreg, Lipitor, aldacton, and imdur.  ASA was stopped at last hospitalization given active bleeding.   - Held AM lasix and spironolactone as patient is normotensive in ED, consider restarting if he becomes hypertensive  #Chronic Portal Vein Thrombosis: Found on CT imaging during admission in march 2017. Repeat CT abdomen on 7/31 showed less extensive chronic thrombus compared to imaging from March. GI was consulted at that time and recommendedthat the risks of anticoagulation outweighed the benefits given his significant bleed history.   Depression - Continue home bupropion  OSA - CPAP QHS  FEN/GI: NS@100cc /hr, Protonix, NPO  Prophylaxis: only SCDs given active bleed   Disposition: Home pending clinical improvement/GI recommendations  History of Present Illness:  Vincent Ochoa is a 60 y.o. male presenting with abdominal pain and hematemesis. He notes that he started vomiting 2-3 days ago after recent hospital discharge. Over the last day he has had 3-4 episodes of bright red blood in vomitus. Has also some vomiting with just pink tinged vomitus.  He notes that his abdomen feels distended compared to usual. He endorses melena.  Denies hematochezia, constipation, diarrhea.    He noticed a spider bite on his left hand one day ago with associated pain, warmth and redness, no fevers.  The apparent cellulitis demarcated with a skin marker in the ED and the patient was started on vancomycin.  Of note, patient was admitted two weeks ago with similar complaint of bloody emesis, and EGD  was done on that admission. He was found to have Grade II varices  in the distal esophagus, noted to be small in size and three bands were successfully placed.  At that time GI recommended repeat endoscopy in one month with  additional banding if needed.   Review Of Systems: Per HPI. Otherwise the remainder of the systems were negative.  Patient Active Problem List   Diagnosis Date Noted  . Cellulitis of left hand   . Essential hypertension   . Hematemesis 06/21/2016  . Chronic combined systolic and diastolic congestive heart failure ([place])   . Abdominal pain 06/20/2016  . Erectile dysfunction 06/03/2016  . Cirrhosis of liver with ascites ([place]) 02/25/2016  . Dyspnea   . Generalized abdominal pain   . Portal vein thrombosis   . Alcoholic cirrhosis of liver with ascites ([place])   . Bleeding gastric varices   . Esophageal varices with bleeding ([place])   . GI bleed 02/04/2016  . Gastrointestinal hemorrhage 02/04/2016  . Blood loss anemia   . Feeling down 01/10/2016  . Chest pain 01/02/2016  . Diastolic heart failure ([place]) 10/07/2015  . Leukopenia 09/15/2015  . Acrochordon 07/18/2015  . PAF (paroxysmal atrial fibrillation) ([place])   . Obese   . Transaminitis 04/23/2015  . Knee pain, bilateral 03/05/2015  . Depression 01/08/2015  . OSA (obstructive sleep apnea)   . Physical deconditioning 12/08/2014  . Paroxysmal atrial fibrillation ([place]) 11/05/2014  . Hyperlipidemia LDL goal <70 11/05/2014  . NSTEMI (non-ST elevated myocardial infarction) ([place])   . Back pain, lumbosacral 01/04/2013  . Thrombocytopenia ([place]) 11/29/2012  . Decreased libido 08/15/2011  . GERD (gastroesophageal reflux disease) 08/15/2011  . Coronary artery disease involving native coronary artery of native heart without angina pectoris 07/29/2011  . Psoriasis 07/29/2011    Past Medical History: Past Medical History:  Diagnosis Date  . Back pain, lumbosacral 2014  . Coronary artery disease    a. Evaluated by Dr. Stanford Breed 2012 - nonobstructive 2007-2008 by cath. b. Abnormal stress test 10/2014 but cath deferred temporarily due to thrombocytopenia. c. 10/2014: readm with AF-RVR/NSTEMI - s/p BMS to mLCx 11/04/14 (mod dz in RCA);  d.  relook cath 01/2015 and 06/02/2015 patent stent, nonobs CAD, EF 55-65%.  . Elevated LFTs   . Facial cellulitis 2014   Periorbital  . Fatty liver US - 2012  . Frequent headaches   . GERD (gastroesophageal reflux disease)   . Hyperlipidemia   . Morbid obesity ([place])   . Obesity   . OSA (obstructive sleep apnea)    severe with AHI 56/hr with successful CPAP titration to 18cm H2O  . PAF (paroxysmal atrial fibrillation) ([place])    a. Episode 10/2014 at time of NSTEMI, spont converted to NSR. Observation for further episodes (not on anticoag at present time due to [facility] = 1, thrombocytopenia).  . Psoriasis   . Sinus bradycardia   . Thrombocytopenia ([place])    a. Onset unclear, but noted 2012. ? 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 s/p prednisone, IVIG.    Past Surgical History: Past Surgical History:  Procedure Laterality Date  . CARDIAC CATHETERIZATION    . CARDIAC CATHETERIZATION N/A 06/02/2015   Procedure: Left Heart Cath and Coronary Angiography;  Surgeon: Jettie Booze, MD;  Location: [facility];  Service: Cardiovascular;  Laterality: N/A;  . CHOLECYSTECTOMY  2007  . ESOPHAGOGASTRODUODENOSCOPY N/A 06/22/2016   Procedure: ESOPHAGOGASTRODUODENOSCOPY (EGD);  Surgeon: Clarene Essex, MD;  Location: [facility];  Service:  Endoscopy;  Laterality: N/A;  . FACIAL COSMETIC SURGERY  2014  . KNEE ARTHROPLASTY  1970's  . LEFT HEART CATHETERIZATION WITH CORONARY ANGIOGRAM N/A 11/04/2014   Procedure: LEFT HEART CATHETERIZATION WITH CORONARY ANGIOGRAM;  Surgeon: Jettie Booze, MD;  Location: [facility] CATH LAB;  Service: Cardiovascular;  Laterality: N/A;  . LEFT HEART CATHETERIZATION WITH CORONARY ANGIOGRAM N/A 01/28/2015   Procedure: LEFT HEART CATHETERIZATION WITH CORONARY ANGIOGRAM;  Surgeon: Sherren Mocha, MD;  Location: [facility] CATH LAB;  Service: Cardiovascular;  Laterality: N/A;  . RADIOLOGY WITH ANESTHESIA N/A 02/04/2016   Procedure: RADIOLOGY WITH  ANESTHESIA;  Surgeon: Medication Radiologist, MD;  Location: [facility];  Service: Radiology;  Laterality: N/A;  . TONSILLECTOMY  1960's    Social History: Social History  Substance Use Topics  . Smoking status: Former Smoker    Packs/day: 0.20    Years: 43.00    Types: Cigarettes  . Smokeless tobacco: Never Used     Comment: 11/28/2012 "chew occasionally"  Quit for 2.5 months in early 2016  . Alcohol use 0.0 oz/week     Comment: 11/28/2012 "beer q 6 months or so"   Additional social history:Patient notes last drink was in 1977. Please also refer to relevant sections of EMR.  Family History: Family History  Problem Relation Age of Onset  . Cirrhosis Mother   . Heart attack Mother   . Hypertension Mother   . Hypertension Father   . Stroke Father   . Arthritis Maternal Grandmother   . Heart disease Sister   . Diabetes Sister   . Cancer - Ovarian Sister   . Heart disease      Maternal/paternal grandparents  . Heart attack Maternal Aunt      Allergies and Medications: No Known Allergies No current facility-administered medications on file prior to encounter.    Current Outpatient Prescriptions on File Prior to Encounter  Medication Sig Dispense Refill  . atorvastatin (LIPITOR) 40 MG tablet Take 1 tablet (40 mg total) by mouth daily at 8 pm. 30 tablet 0  . buPROPion (WELLBUTRIN XL) 150 MG 24 hr tablet Take 1 tablet (150 mg total) by mouth daily. 30 tablet 3  . carvedilol (COREG) 3.125 MG tablet Take 1 tablet (3.125 mg total) by mouth 2 (two) times daily with a meal. 60 tablet 0  . furosemide (LASIX) 80 MG tablet Take 1 tablet (80 mg total) by mouth 2 (two) times daily. 120 tablet 0  . isosorbide mononitrate (IMDUR) 30 MG 24 hr tablet Take 1 tablet (30 mg total) by mouth daily. 30 tablet 1  . nitroGLYCERIN (NITROSTAT) 0.4 MG SL tablet Place 1 tablet (0.4 mg total) under the tongue every 5 (five) minutes as needed for chest pain (up to 3 doses). 25 tablet 3  . pantoprazole (PROTONIX)  40 MG tablet Take 1 tablet (40 mg total) by mouth 2 (two) times daily. 60 tablet 2  . spironolactone (ALDACTONE) 100 MG tablet Take 2 tablets (200 mg total) by mouth daily. 60 tablet 1    Objective: BP 117/71   Pulse 67   Temp 98 F (36.7 C) (Oral)   Resp 20   SpO2 95%  Exam: General: Well-appearing male sits comfortably in bed, NAD Eyes: PERRL, EOMI, no conjunctival injection or palor ENTM: MMM, no pharyngeal erythema or exudate, +red noted on tongue may be from hematemesis  Neck: supple, full ROM Cardiovascular: RRR, no m/r/g Respiratory: CTA bilaterally, no W/R/R Abdomen: +mildly distended and +diffusely tender to palpation with no rebound or  guarding, +normoactive BS, no hepatosplenomegally MSK: full ROM in 4 extremities Skin: + ~4cmx4cm erythematous, warm area of induration on dorsal aspect of left hand with apparent puncture wound, consistent with bug bite Neuro: CNII-XII grossly intact Psych: AAOx3, patient is sleepy but arousable  Labs and Imaging: CBC BMET   Recent Labs Lab 07/04/16 0250  WBC 5.8  HGB 11.5*  HCT 34.9*  PLT 61*    Recent Labs Lab 07/04/16 0250  NA 136  K 4.5  CL 104  CO2 27  BUN 13  CREATININE 1.08  GLUCOSE 128*  CALCIUM 9.2       Everrett Coombe, MD 07/04/2016, 6:59 AM PGY-1, [place] [facility] pager: [phone], text pages welcome   Upper Level Addendum:  I have seen and evaluated this patient along with Dr. Burr Medico and reviewed the above note, making necessary revisions in green.   Phill Myron, D.O. 07/04/2016, 7:17 AM PGY-2, [place]

## 2016-07-04 NOTE — Care Management Note (Addendum)
Case Management Note  Patient Details  Name: Vincent Ochoa MRN: [id number] Date of Birth: October 03, 1956  Subjective/Objective:                 Patient from home with wife, lives in [place], pharmacy is [facility], not active with HH, no DME use or needs expected at DC, independent, drives. PCP Rachael Fee at [facility]. Was DC'd this month for GI bleed. In obs.    Action/Plan: No CM needs identified at this time.   Expected Discharge Date:                  Expected Discharge Plan:  Home/Self Care  In-House Referral:  NA  Discharge planning Services  CM Consult  Post Acute Care Choice:  NA Choice offered to:  Patient  DME Arranged:    DME Agency:     HH Arranged:    [place] Agency:     Status of Service:  In process, will continue to follow  If discussed at Long Length of Stay Meetings, dates discussed:    Additional Comments:  Carles Collet, RN 07/04/2016, 1:18 PM

## 2016-07-04 NOTE — Progress Notes (Signed)
FPTS Interim Progress Note  S: Patient seen and examined this am. States had tablespoons of blood at home in his emesis which is partially what brought him to the hospital and the other reason was the bug bit on his hand. States feels groggy this am since had dilaudid in the ED. States continues to have midepigastric abd pain and abd distention. Follows with Dr Lynne Logan as outpt.  O: BP 117/71   Pulse 67   Temp 98 F (36.7 C) (Oral)   Resp 20   SpO2 95%   General: lying in bed, appears groggy and uncomfortable but arousable CV: RRR, no murmurs noted Lungs: CTAB, normal effort.  Abd: +mildly distended and +diffusely tender to palpation but particularly tender at midepigastric region with mild guarding. No rebound +normoactive BS, no hepatosplenomegally Extremities: no LE edema  A/P: GI consult, appreciate recommendations. Keeping NPO for possible intervention Continue Pepcid IVPB 20 mg BID, Zofran PRN, NS @100  Monitor CBC  Bufford Lope, DO 07/04/2016, 7:24 AM PGY-1, [place] Medicine Service pager [phone]

## 2016-07-04 NOTE — ED Notes (Signed)
Admitting at bedside 

## 2016-07-04 NOTE — Progress Notes (Signed)
Pharmacy Antibiotic Note  Vincent Ochoa is a 60 y.o. male admitted on 07/04/2016 with hematemesis, abdominal pain, and left-hand cellulitis.Pharmacy to dose clindamycin. Transition to IV as pt is now NPO pending potential surgical intervention. Currently afebrile, WBC WNL as of AM labs. No adjustment for renal function necessary. Will sign off. Please re-consult if further need.   Plan: -Clindamycin 600mg  q8h -Monitor LOT, clinical improvement  Height: 6\' 3"  (190.5 cm) Weight: 268 lb 6.4 oz (121.7 kg) IBW/kg (Calculated) : 84.5  Temp (24hrs), Avg:98.3 F (36.8 C), Min:98 F (36.7 C), Max:98.5 F (36.9 C)   Recent Labs Lab 07/04/16 0250 07/04/16 0615  WBC 5.8 3.6*  CREATININE 1.08  --     Estimated Creatinine Clearance: 102.3 mL/min (by C-G formula based on SCr of 1.08 mg/dL).    No Known Allergies  Thank you for allowing pharmacy to be a part of this patient's care.  Stephens November, PharmD Clinical Pharmacist 1:04 PM, 07/04/2016

## 2016-07-04 NOTE — ED Provider Notes (Signed)
[facility] DEPT Provider Note   CSN: [id number] Arrival date & time: 07/04/16  0154  First Provider Contact:  First MD Initiated Contact with Patient 07/04/16 0308     By signing my name below, I, Vincent Ochoa, attest that this documentation has been prepared under the direction and in the presence of physician practitioner, Drenda Freeze, MD. Electronically Signed: Dora Ochoa, Scribe. 07/04/2016. 3:08 AM.  History   Chief Complaint Chief Complaint  Patient presents with  . Emesis  . Insect Bite    The history is provided by the patient. No language interpreter was used.     HPI Comments: Vincent Ochoa is a 60 y.o. male brought in by EMS who presents to the Emergency Department complaining of dorsal left hand pain s/p possible insect bite 2 days ago. Pt reports he tried to drain the wound on his left hand unsuccessfully. Pt also notes fever, chills, nausea, and vomiting (hematemesis) since the bite. He notes hematemesis x2, once in the EMS van en route tonight and once in triage tonight. Pt reports his hematemesis is only tinged with blood. Pt denies using alcohol and reports he last drank when he was in the [place] in the 70's; he notes h/o liver cirrhosis. He denies any other symptoms or complaints. He was recently admitted to the hospital for GI bleed. He had endoscopy recently that showed varices.   Past Medical History:  Diagnosis Date  . Back pain, lumbosacral 2014  . Coronary artery disease    a. Evaluated by Dr. Stanford Breed 2012 - nonobstructive 2007-2008 by cath. b. Abnormal stress test 10/2014 but cath deferred temporarily due to thrombocytopenia. c. 10/2014: readm with AF-RVR/NSTEMI - s/p BMS to mLCx 11/04/14 (mod dz in RCA);  d. relook cath 01/2015 and 06/02/2015 patent stent, nonobs CAD, EF 55-65%.  . Elevated LFTs   . Facial cellulitis 2014   Periorbital  . Fatty liver US - 2012  . Frequent headaches   . GERD (gastroesophageal reflux disease)   .  Hyperlipidemia   . Morbid obesity ([place])   . Obesity   . OSA (obstructive sleep apnea)    severe with AHI 56/hr with successful CPAP titration to 18cm H2O  . PAF (paroxysmal atrial fibrillation) ([place])    a. Episode 10/2014 at time of NSTEMI, spont converted to NSR. Observation for further episodes (not on anticoag at present time due to [facility] = 1, thrombocytopenia).  . Psoriasis   . Sinus bradycardia   . Thrombocytopenia ([place])    a. Onset unclear, but noted 2012. ? 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 s/p prednisone, IVIG.    Patient Active Problem List   Diagnosis Date Noted  . Hematemesis 06/21/2016  . Chronic systolic heart failure ([place])   . Abdominal pain 06/20/2016  . Erectile dysfunction 06/03/2016  . Cirrhosis of liver with ascites ([place]) 02/25/2016  . Dyspnea   . Generalized abdominal pain   . Portal vein thrombosis   . Alcoholic cirrhosis of liver with ascites ([place])   . Bleeding gastric varices   . Esophageal varices with bleeding ([place])   . GI bleed 02/04/2016  . Gastrointestinal hemorrhage 02/04/2016  . Blood loss anemia   . Feeling down 01/10/2016  . Chest pain 01/02/2016  . Diastolic heart failure ([place]) 10/07/2015  . Leukopenia 09/15/2015  . Acrochordon 07/18/2015  . PAF (paroxysmal atrial fibrillation) ([place])   . Obese   . Transaminitis 04/23/2015  . Knee pain, bilateral 03/05/2015  .  Depression 01/08/2015  . OSA (obstructive sleep apnea)   . Physical deconditioning 12/08/2014  . Paroxysmal atrial fibrillation ([place]) 11/05/2014  . Hyperlipidemia LDL goal <70 11/05/2014  . NSTEMI (non-ST elevated myocardial infarction) ([place])   . Back pain, lumbosacral 01/04/2013  . Thrombocytopenia ([place]) 11/29/2012  . Decreased libido 08/15/2011  . GERD (gastroesophageal reflux disease) 08/15/2011  . Atherosclerosis of native coronary artery of native heart without angina pectoris 07/29/2011  . Psoriasis 07/29/2011    Past Surgical  History:  Procedure Laterality Date  . CARDIAC CATHETERIZATION    . CARDIAC CATHETERIZATION N/A 06/02/2015   Procedure: Left Heart Cath and Coronary Angiography;  Surgeon: Jettie Booze, MD;  Location: [facility] CV LAB;  Service: Cardiovascular;  Laterality: N/A;  . CHOLECYSTECTOMY  2007  . ESOPHAGOGASTRODUODENOSCOPY N/A 06/22/2016   Procedure: ESOPHAGOGASTRODUODENOSCOPY (EGD);  Surgeon: Clarene Essex, MD;  [facility] ENDOSCOPY;  Service: Endoscopy;  Laterality: N/A;  . FACIAL COSMETIC SURGERY  2014  . KNEE ARTHROPLASTY  1970's  . LEFT HEART CATHETERIZATION WITH CORONARY ANGIOGRAM N/A 11/04/2014   Procedure: LEFT HEART CATHETERIZATION WITH CORONARY ANGIOGRAM;  Surgeon: Jettie Booze, MD;  Location: [facility] CATH LAB;  Service: Cardiovascular;  Laterality: N/A;  . LEFT HEART CATHETERIZATION WITH CORONARY ANGIOGRAM N/A 01/28/2015   Procedure: LEFT HEART CATHETERIZATION WITH CORONARY ANGIOGRAM;  Surgeon: Sherren Mocha, MD;  Location: [facility] CATH LAB;  Service: Cardiovascular;  Laterality: N/A;  . RADIOLOGY WITH ANESTHESIA N/A 02/04/2016   Procedure: RADIOLOGY WITH ANESTHESIA;  Surgeon: Medication Radiologist, MD;  Location: [facility];  Service: Radiology;  Laterality: N/A;  . TONSILLECTOMY  1960's       Home Medications    Prior to Admission medications   Medication Sig Start Date End Date Taking? Authorizing Provider  atorvastatin (LIPITOR) 40 MG tablet Take 1 tablet (40 mg total) by mouth daily at 8 pm. 06/23/16  Yes Everrett Coombe, MD  buPROPion (WELLBUTRIN XL) 150 MG 24 hr tablet Take 1 tablet (150 mg total) by mouth daily. 06/03/16  Yes Sela Hilding, MD  carvedilol (COREG) 3.125 MG tablet Take 1 tablet (3.125 mg total) by mouth 2 (two) times daily with a meal. 06/23/16  Yes Everrett Coombe, MD  furosemide (LASIX) 80 MG tablet Take 1 tablet (80 mg total) by mouth 2 (two) times daily. 06/03/16  Yes Sela Hilding, MD  isosorbide mononitrate (IMDUR) 30 MG 24 hr tablet Take 1 tablet (30 mg total)  by mouth daily. 06/03/16  Yes Sela Hilding, MD  nitroGLYCERIN (NITROSTAT) 0.4 MG SL tablet Place 1 tablet (0.4 mg total) under the tongue every 5 (five) minutes as needed for chest pain (up to 3 doses). 10/31/14  Yes Dayna N Dunn, PA-C  pantoprazole (PROTONIX) 40 MG tablet Take 1 tablet (40 mg total) by mouth 2 (two) times daily. 06/03/16  Yes Sela Hilding, MD  spironolactone (ALDACTONE) 100 MG tablet Take 2 tablets (200 mg total) by mouth daily. 06/03/16  Yes Sela Hilding, MD    Family History Family History  Problem Relation Age of Onset  . Cirrhosis Mother   . Heart attack Mother   . Hypertension Mother   . Hypertension Father   . Stroke Father   . Arthritis Maternal Grandmother   . Heart disease Sister   . Diabetes Sister   . Cancer - Ovarian Sister   . Heart disease      Maternal/paternal grandparents  . Heart attack Maternal Aunt     Social History Social History  Substance Use Topics  .  Smoking status: Former Smoker    Packs/day: 0.20    Years: 43.00    Types: Cigarettes  . Smokeless tobacco: Never Used     Comment: 11/28/2012 "chew occasionally"  Quit for 2.5 months in early 2016  . Alcohol use 0.0 oz/week     Comment: 11/28/2012 "beer q 6 months or so"     Allergies   Review of patient's allergies indicates no known allergies.   Review of Systems Review of Systems  Constitutional: Positive for chills and fever.  Gastrointestinal: Positive for nausea and vomiting (hematemesis).  Musculoskeletal: Positive for myalgias (dorsal left hand).  All other systems reviewed and are negative.   Physical Exam Updated Vital Signs BP 119/63   Pulse 73   Temp 98 F (36.7 C) (Oral)   Resp 20   SpO2 97%   Physical Exam  Constitutional: He is oriented to person, place, and time. He appears well-developed and well-nourished. No distress.  HENT:  Head: Normocephalic and atraumatic.  Mouth/Throat: Mucous membranes are dry.  Mucous membranes are slightly  dry.  Eyes: Conjunctivae and EOM are normal.  Neck: Neck supple. No tracheal deviation present.  Cardiovascular: Normal rate.   Pulmonary/Chest: Effort normal. No respiratory distress.  Abdominal: There is tenderness in the epigastric area. There is no rebound.  Mild fluid wave. Mild epigastric tenderness. No rebound.  Musculoskeletal: Normal range of motion.  Neurological: He is alert and oriented to person, place, and time.  Skin: Skin is warm and dry.  Blister vs bug bite with surrounding erythema. No fluctuance   Psychiatric: He has a normal mood and affect. His behavior is normal.  Nursing note and vitals reviewed.     ED Treatments / Results  Labs (all labs ordered are listed, but only abnormal results are displayed) Labs Reviewed  COMPREHENSIVE METABOLIC PANEL - Abnormal; Notable for the following:       Result Value   Glucose, Bld 128 (*)    Albumin 3.2 (*)    AST 62 (*)    Total Bilirubin 1.6 (*)    All other components within normal limits  CBC - Abnormal; Notable for the following:    RBC 3.80 (*)    Hemoglobin 11.5 (*)    HCT 34.9 (*)    RDW 15.9 (*)    Platelets 61 (*)    All other components within normal limits  LIPASE, BLOOD  URINALYSIS, ROUTINE W REFLEX MICROSCOPIC (NOT AT [facility])  CBC    EKG  EKG Interpretation None       Radiology No results found.  Procedures Procedures (including critical care time)  DIAGNOSTIC STUDIES: Oxygen Saturation is 100% on RA, normal by my interpretation.    COORDINATION OF CARE: 3:08 AM Discussed treatment plan with pt at bedside and pt agreed to plan.  Medications Ordered in ED Medications  sodium chloride 0.9 % bolus 1,000 mL (0 mLs Intravenous Stopped 07/04/16 0529)  morphine 4 MG/ML injection 4 mg (4 mg Intravenous Given 07/04/16 0322)  ondansetron (ZOFRAN) injection 4 mg (4 mg Intravenous Given 07/04/16 0322)  clindamycin (CLEOCIN) IVPB 600 mg (0 mg Intravenous Stopped 07/04/16 0529)  promethazine  (PHENERGAN) injection 25 mg (25 mg Intravenous Given 07/04/16 0525)  HYDROmorphone (DILAUDID) injection 1 mg (1 mg Intravenous Given 07/04/16 0523)     Initial Impression / Assessment and Plan / ED Course  I have reviewed the triage vital signs and the nursing notes.  Pertinent labs & imaging results that were available during my care  of the patient were reviewed by me and considered in my medical decision making (see chart for details).  Clinical Course   USIEL ZUEGE is a 60 y.o. male hx of cirrhosis with hematemasis, cellulitis. Has hx of varices and has two episodes of hematemsis. Concerned for possible variceal bleed. Will check CBC and give nausea meds. Cellulitis likely from a bug bite, no purulent discharge or fluctuance. Will give clindamycin.  6:04 AM CBC showed nl WBC, Hg stable. Given multiple rounds of nausea meds and still nauseated and unable to keep anything. Given hx of variceal bleed, will admit to family practice.    I personally performed the services described in this documentation, which was scribed in my presence. The recorded information has been reviewed and is accurate.  Final Clinical Impressions(s) / ED Diagnoses   Final diagnoses:  None    New Prescriptions New Prescriptions   No medications on file     Drenda Freeze, MD 07/04/16 [phone]

## 2016-07-04 NOTE — Discharge Summary (Signed)
[facility] Discharge Summary  Patient name: Vincent Ochoa Medical record number: [id number] Date of birth: Jan 26, 1956 Age: 60 y.o. Gender: male Date of Admission: 07/04/2016  Date of Discharge: 07/05/16 Admitting Physician: Alveda Reasons, MD  Primary Care Provider: Ralene Ok, MD Consultants: Gastroenterology  Indication for Hospitalization: Hematemesis  Discharge Diagnoses/Problem List:  Hematemesis  Cellulitis of L hand Alcoholic cirrhosis HFrEF Chronic portal vein thrombosis Depression OSA  Disposition: home  Discharge Condition: stable, improved   Discharge Exam:  General: Well-appearing male sits comfortably in bed, NAD Cardiovascular: RRR, no m/r/g Respiratory: CTA bilaterally, no W/R/R Abdomen: +mildly distended and TTP only at mid-epigastric region. No reboundor guarding +normoactive BS, no hepatosplenomegaly Extremities: no LE edema   Brief Hospital Course:  Vincent Ochoa is a 60 y.o. male presenting with 1 day of hematemesis and abdominal pain, as well as left hand cellulitis secondary to apparent bug bite.  PMH is significant for alcoholic cirrhosis, CAD, depression, HFpEF, HLD, OSA.  Hematemesis  Patient has a h/o  presented with 1 day of hematemesis and midepigastric abdominal pain. He has a h/o variceal bleeds in March 2017 requiring obliteration of varices (Balloon-Occluded Retrograde Transvenous Obliteration of Gastric Varices) and had a EGD on last hospital stay (discharged on 8/1) with esophageal varices that were banded). Stated this episode of hematemesis was worse than last admission but far better than in March, had approximately 3-4 episodes of  "tablespoons" of bright red blood in vomitus. GI was consulted and recommended PPI therapy indefinitely and sucralfate suspension 1 gram po qac/qhs x 6 weeks. No further GI tract evaluation was deemed. Hgb remained stable at 10.2  Cellulitis of the left hand  Patient  noticed an insect bite x 1 day with associated pain, warmth and redness, no fevers.  The apparent cellulitis demarcated with a skin marker in the ED and the patient was started on vancomycin with de-escalation to IV clindamycin. Patient was afebrile and with stable vital signs, had clinical improvement so was further de-escalated to po doxycycline that was continued on discharge for a total of 7 days.  Hypotension Patient on admission was normotensive so his home lasix and spironolactone was held. During hospital stay, patient  had low BP 90s/50s likely 2/2 blood loss from hematemesis. His home imdur was discontinued and had gentle IV hydration with improvement of BP to 118/63 on discharge.  The remainder of his chronic medical conditions were stable and changes were made to his home medications.  Issues for Follow Up:  1. Please ensure patient completes 7 day course of antibiotics for L hand cellulitis, discharged on po doxycycline, last dose 07/10/16. 2. Home BP meds (lasix, spironolactone, imdur) were held on discharge d/t low BP 118/63 on discharge. Patient was kept on coreg d/t CHF history. 3. Per GI, patient needs to stay on PPI therapy indefinitely and needs total of 6 weeks of sucralfate, last dose 08/15/16. 4. Please have patient follow up as planned with GI Dr Michail Sermon.   Significant Procedures: none  Significant Labs and Imaging:   Recent Labs Lab 07/04/16 0615 07/04/16 1440 07/05/16 0543  WBC 3.6* 3.1* 3.2*  HGB 10.4* 9.9* 10.2*  HCT 31.4* 30.6* 31.7*  PLT 46* 41* 47*    Recent Labs Lab 07/04/16 0250 07/05/16 0543  NA 136 136  K 4.5 5.1  CL 104 108  CO2 27 22  GLUCOSE 128* 117*  BUN 13 12  CREATININE 1.08 0.91  CALCIUM 9.2 8.3*  ALKPHOS 90  --  AST 62*  --   ALT 47  --   ALBUMIN 3.2*  --       Results/Tests Pending at Time of Discharge: none  Discharge Medications:    Medication List    STOP taking these medications   furosemide 80 MG tablet Commonly  known as:  LASIX   isosorbide mononitrate 30 MG 24 hr tablet Commonly known as:  IMDUR   spironolactone 100 MG tablet Commonly known as:  ALDACTONE     TAKE these medications   atorvastatin 40 MG tablet Commonly known as:  LIPITOR Take 1 tablet (40 mg total) by mouth daily at 8 pm.   buPROPion 150 MG 24 hr tablet Commonly known as:  WELLBUTRIN XL Take 1 tablet (150 mg total) by mouth daily.   carvedilol 3.125 MG tablet Commonly known as:  COREG Take 1 tablet (3.125 mg total) by mouth 2 (two) times daily with a meal.   doxycycline 100 MG tablet Commonly known as:  VIBRA-TABS Take 1 tablet (100 mg total) by mouth every 12 (twelve) hours.   nitroGLYCERIN 0.4 MG SL tablet Commonly known as:  NITROSTAT Place 1 tablet (0.4 mg total) under the tongue every 5 (five) minutes as needed for chest pain (up to 3 doses).   pantoprazole 40 MG tablet Commonly known as:  PROTONIX Take 1 tablet (40 mg total) by mouth 2 (two) times daily.   sucralfate 1 GM/10ML suspension Commonly known as:  CARAFATE Take 10 mLs (1 g total) by mouth 4 (four) times daily -  with meals and at bedtime.       Discharge Instructions: Please refer to Patient Instructions section of EMR for full details.  Patient was counseled important signs and symptoms that should prompt return to medical care, changes in medications, dietary instructions, activity restrictions, and follow up appointments.   Follow-Up Appointments: Follow-up Information    Ralene Ok, MD. Go on 07/06/2016.   Specialty:  Family Medicine Why:  07/06/16 2:30pm for hospital followup Contact information: [place] [id number] [phone]           Bufford Lope, DO 07/05/2016, 3:45 PM PGY-1, [place]

## 2016-07-04 NOTE — Consult Note (Signed)
[facility] Gastroenterology Consultation Note  Referring Provider: Dr. Esmond Camper Primary Care Physician:  Ralene Ok, MD Primary Gastroenterologist:  Dr. Wilford Corner  Reason for Consultation:  hematemesis  HPI: Vincent Ochoa is a 60 y.o. male admitted for cellulitis of hand and gastrointestinal bleeding.  Patient has had several days of blood-streaked vomitus, since his endoscopic banding procedure done a couple weeks ago.  Has some generalized, but upper-predominant, abdominal pain for the past several weeks.  Recent CT scan unrevealing.  Patient has no melena or hematochezia.   Past Medical History:  Diagnosis Date  . Back pain, lumbosacral 2014  . Coronary artery disease    a. Evaluated by Dr. Stanford Breed 2012 - nonobstructive 2007-2008 by cath. b. Abnormal stress test 10/2014 but cath deferred temporarily due to thrombocytopenia. c. 10/2014: readm with AF-RVR/NSTEMI - s/p BMS to mLCx 11/04/14 (mod dz in RCA);  d. relook cath 01/2015 and 06/02/2015 patent stent, nonobs CAD, EF 55-65%.  . Elevated LFTs   . Facial cellulitis 2014   Periorbital  . Fatty liver US - 2012  . Frequent headaches   . GERD (gastroesophageal reflux disease)   . Hyperlipidemia   . Morbid obesity ([place])   . Obesity   . OSA (obstructive sleep apnea)    severe with AHI 56/hr with successful CPAP titration to 18cm H2O  . PAF (paroxysmal atrial fibrillation) ([place])    a. Episode 10/2014 at time of NSTEMI, spont converted to NSR. Observation for further episodes (not on anticoag at present time due to [facility] = 1, thrombocytopenia).  . Psoriasis   . Sinus bradycardia   . Thrombocytopenia ([place])    a. Onset unclear, but noted 2012. ? 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 s/p prednisone, IVIG.    Past Surgical History:  Procedure Laterality Date  . CARDIAC CATHETERIZATION    . CARDIAC CATHETERIZATION N/A 06/02/2015   Procedure: Left Heart Cath and Coronary Angiography;   Surgeon: Jettie Booze, MD;  Location: [facility] CV LAB;  Service: Cardiovascular;  Laterality: N/A;  . CHOLECYSTECTOMY  2007  . ESOPHAGOGASTRODUODENOSCOPY N/A 06/22/2016   Procedure: ESOPHAGOGASTRODUODENOSCOPY (EGD);  Surgeon: Clarene Essex, MD;  Location: [facility] ENDOSCOPY;  Service: Endoscopy;  Laterality: N/A;  . FACIAL COSMETIC SURGERY  2014  . KNEE ARTHROPLASTY  1970's  . LEFT HEART CATHETERIZATION WITH CORONARY ANGIOGRAM N/A 11/04/2014   Procedure: LEFT HEART CATHETERIZATION WITH CORONARY ANGIOGRAM;  Surgeon: Jettie Booze, MD;  Location: [facility] CATH LAB;  Service: Cardiovascular;  Laterality: N/A;  . LEFT HEART CATHETERIZATION WITH CORONARY ANGIOGRAM N/A 01/28/2015   Procedure: LEFT HEART CATHETERIZATION WITH CORONARY ANGIOGRAM;  Surgeon: Sherren Mocha, MD;  Location: [facility] CATH LAB;  Service: Cardiovascular;  Laterality: N/A;  . RADIOLOGY WITH ANESTHESIA N/A 02/04/2016   Procedure: RADIOLOGY WITH ANESTHESIA;  Surgeon: Medication Radiologist, MD;  Location: [place];  Service: Radiology;  Laterality: N/A;  . TONSILLECTOMY  1960's    Prior to Admission medications   Medication Sig Start Date End Date Taking? Authorizing Provider  atorvastatin (LIPITOR) 40 MG tablet Take 1 tablet (40 mg total) by mouth daily at 8 pm. 06/23/16  Yes Everrett Coombe, MD  buPROPion (WELLBUTRIN XL) 150 MG 24 hr tablet Take 1 tablet (150 mg total) by mouth daily. 06/03/16  Yes Sela Hilding, MD  carvedilol (COREG) 3.125 MG tablet Take 1 tablet (3.125 mg total) by mouth 2 (two) times daily with a meal. 06/23/16  Yes Everrett Coombe, MD  furosemide (LASIX) 80 MG tablet  Take 1 tablet (80 mg total) by mouth 2 (two) times daily. 06/03/16  Yes Sela Hilding, MD  isosorbide mononitrate (IMDUR) 30 MG 24 hr tablet Take 1 tablet (30 mg total) by mouth daily. 06/03/16  Yes Sela Hilding, MD  nitroGLYCERIN (NITROSTAT) 0.4 MG SL tablet Place 1 tablet (0.4 mg total) under the tongue every 5 (five) minutes as needed for chest pain  (up to 3 doses). 10/31/14  Yes Dayna N Dunn, PA-C  pantoprazole (PROTONIX) 40 MG tablet Take 1 tablet (40 mg total) by mouth 2 (two) times daily. 06/03/16  Yes Sela Hilding, MD  spironolactone (ALDACTONE) 100 MG tablet Take 2 tablets (200 mg total) by mouth daily. 06/03/16  Yes Sela Hilding, MD    Current Facility-Administered Medications  Medication Dose Route Frequency Provider Last Rate Last Dose  . 0.9 %  sodium chloride infusion   Intravenous Continuous Nicolette Bang, DO 100 mL/hr at 07/04/16 [id number]    . atorvastatin (LIPITOR) tablet 40 mg  40 mg Oral Q2000 Nicolette Bang, DO      . buPROPion (WELLBUTRIN XL) 24 hr tablet 150 mg  150 mg Oral Daily Nicolette Bang, DO   150 mg at 07/04/16 0934  . carvedilol (COREG) tablet 3.125 mg  3.125 mg Oral BID WC Nicolette Bang, DO   3.125 mg at 07/04/16 0934  . clindamycin (CLEOCIN) IVPB 600 mg  600 mg Intravenous Q8H Alveda Reasons, MD      . famotidine (PEPCID) IVPB 20 mg premix  20 mg Intravenous Q12H Nicolette Bang, DO   20 mg at 07/04/16 0934  . isosorbide mononitrate (IMDUR) 24 hr tablet 30 mg  30 mg Oral Daily Nicolette Bang, DO   30 mg at 07/04/16 0934  . ondansetron (ZOFRAN) injection 4 mg  4 mg Intravenous Q6H PRN Nicolette Bang, DO        Allergies as of 07/04/2016  . (No Known Allergies)    Family History  Problem Relation Age of Onset  . Cirrhosis Mother   . Heart attack Mother   . Hypertension Mother   . Hypertension Father   . Stroke Father   . Arthritis Maternal Grandmother   . Heart disease Sister   . Diabetes Sister   . Cancer - Ovarian Sister   . Heart disease      Maternal/paternal grandparents  . Heart attack Maternal Aunt     Social History   Social History  . Marital status: Married    Spouse name: N/A  . Number of children: N/A  . Years of education: N/A   Occupational History  . Not on file.   Social History Main Topics   . Smoking status: Former Smoker    Packs/day: 0.20    Years: 43.00    Types: Cigarettes  . Smokeless tobacco: Never Used     Comment: 11/28/2012 "chew occasionally"  Quit for 2.5 months in early 2016  . Alcohol use 0.0 oz/week     Comment: 11/28/2012 "beer q 6 months or so"  . Drug use: No  . Sexual activity: No   Other Topics Concern  . Not on file   Social History Narrative   Lives in [place].    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     Review of Systems: As per HPI, all others negative  Physical Exam: Vital signs  in last 24 hours: Temp:  [98 F (36.7 C)-98.5 F (36.9 C)] 98.5 F (36.9 C) (08/14 0746) Pulse Rate:  [67-86] 67 (08/14 0746) Resp:  [17-20] 17 (08/14 0746) BP: (108-124)/(61-74) 108/64 (08/14 0746) SpO2:  [92 %-100 %] 96 % (08/14 0746) Weight:  [121.7 kg (268 lb 6.4 oz)] 121.7 kg (268 lb 6.4 oz) (08/14 0746) Last BM Date: 07/03/16 General:   Alert,  Well-developed, well-nourished, pleasant and cooperative in NAD Eyes:  Sclera clear, no icterus.   Conjunctiva pink. Ears:  Normal auditory acuity. Nose:  No deformity, discharge,  or lesions. Mouth:  No deformity or lesions.  Oropharynx pink & moist. Neck:  Supple; no masses or thyromegaly. Lungs:  Clear throughout to auscultation.   No wheezes, crackles, or rhonchi. No acute distress. Heart:  Regular rate and rhythm; no murmurs, clicks, rubs,  or gallops. Abdomen:  Soft, mild diffuse tenderness, no obvious ascites, No masses, hepatosplenomegaly or hernias noted. Normal bowel sounds, without guarding, and without rebound.     Msk:  Symmetrical without gross deformities. Normal posture. Pulses:  Normal pulses noted. Extremities:  1-2 + edema bilateral lower extremities Neurologic:  Alert and  oriented x4;  Diffusely weak, otherwise grossly normal neurologically. Skin: cellulitis dorsum of left hand Psych:  Alert and cooperative.  Normal mood and affect.   Lab Results:  Recent Labs  07/04/16 0250 07/04/16 0615  WBC 5.8 3.6*  HGB 11.5* 10.4*  HCT 34.9* 31.4*  PLT 61* 46*   BMET  Recent Labs  07/04/16 0250  NA 136  K 4.5  CL 104  CO2 27  GLUCOSE 128*  BUN 13  CREATININE 1.08  CALCIUM 9.2   LFT  Recent Labs  07/04/16 0250  PROT 7.1  ALBUMIN 3.2*  AST 62*  ALT 47  ALKPHOS 90  BILITOT 1.6*   PT/INR No results for input(s): LABPROT, INR in the last 72 hours.  Studies/Results: No results found.  Impression:  1.  Abdominal pain, longstanding, prior CT and endoscopy within the past couple weeks done.  Benign abdominal exam today. 2.  Cirrhosis with variceal banding of esophagus done couple weeks ago. 3.  Recent hematemesis, mild, non-destabilizing.  Suspect variceal band ulcers, with increased propensity of bleeding due to thrombocytopenia. 4.  Gastric varices s/p BRTO. 5.  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  Plan:  1.  PPI therapy, pantoprazole 40 mg po qd, now and indefinitely. 2.  Sucralfate suspension 1 gram po qac/qhs x 6 weeks, then stop. 3.  Don't see need for any further investigation into patient's self-limited hematemesis, nor patient's longstanding but intermittent abdominal pain. 4.  No further GI tract evaluation is needed at the present time. 5.  [facility] will revisit tomorrow.   LOS: 0 days   Cellie Dardis M  07/04/2016, 1:03 PM  Pager [phone] If no answer or after 5 PM call [phone]

## 2016-07-05 ENCOUNTER — Encounter (HOSPITAL_COMMUNITY): Payer: Self-pay | Admitting: General Practice

## 2016-07-05 LAB — BASIC METABOLIC PANEL
Anion gap: 6 (ref 5–15)
BUN: 12 mg/dL (ref 6–20)
CALCIUM: 8.3 mg/dL — AB (ref 8.9–10.3)
CO2: 22 mmol/L (ref 22–32)
CREATININE: 0.91 mg/dL (ref 0.61–1.24)
Chloride: 108 mmol/L (ref 101–111)
GFR calc Af Amer: >60 mL/min (ref >60)
GLUCOSE: 117 mg/dL — AB (ref 65–99)
Potassium: 5.1 mmol/L (ref 3.5–5.1)
Sodium: 136 mmol/L (ref 135–145)

## 2016-07-05 LAB — CBC
HCT: 31.7 % — ABNORMAL LOW (ref 39.0–52.0)
Hemoglobin: 10.2 g/dL — ABNORMAL LOW (ref 13.0–17.0)
MCH: 29.8 pg (ref 26.0–34.0)
MCHC: 32.2 g/dL (ref 30.0–36.0)
MCV: 92.7 fL (ref 78.0–100.0)
PLATELETS: 47 10*3/uL — AB (ref 150–400)
RBC: 3.42 MIL/uL — ABNORMAL LOW (ref 4.22–5.81)
RDW: 15.8 % — AB (ref 11.5–15.5)
WBC: 3.2 10*3/uL — ABNORMAL LOW (ref 4.0–10.5)

## 2016-07-05 MED ORDER — POLYETHYLENE GLYCOL 3350 17 G PO PACK: 17.0000 g | PACK | Freq: Every day | ORAL | Status: DC

## 2016-07-05 MED ORDER — DOXYCYCLINE HYCLATE 100 MG PO TABS: 100.0000 mg | ORAL_TABLET | Freq: Two times a day (BID) | ORAL | 0 refills | Status: DC

## 2016-07-05 MED ORDER — DOXYCYCLINE HYCLATE 100 MG PO TABS
100.0000 mg | ORAL_TABLET | Freq: Two times a day (BID) | ORAL | Status: DC
  Administered 2016-07-05: 100 mg via ORAL
  Filled 2016-07-05: qty 1

## 2016-07-05 MED ORDER — SUCRALFATE 1 GM/10ML PO SUSP
1.0000 g | Freq: Three times a day (TID) | ORAL | 0 refills | Status: DC
Start: 2016-07-05 — End: 2016-08-08

## 2016-07-05 MED ORDER — SUCRALFATE 1 GM/10ML PO SUSP: 1.0000 g | Freq: Three times a day (TID) | ORAL | 0 refills | Status: DC

## 2016-07-05 NOTE — Progress Notes (Signed)
Patient was discharged home by MD order; discharged instructions  review and give to patient with care notes and prescriptions; IV DIC; patient will be escorted to the car by nurse tech via wheelchair.  

## 2016-07-05 NOTE — Discharge Instructions (Signed)
You were admitted for hematemesis and gastroenterology felt that the bleeding was from the variceal band ulcers, you will need to stay on protonix or a similar medication indefinitely, you will also need to stay on sucralfate for a total of 6 weeks. Please follow up with Dr Michail Sermon as planned for further management.    Gastrointestinal Bleeding Gastrointestinal (GI) bleeding means there is bleeding somewhere along the digestive tract, between the mouth and anus. CAUSES  There are many different problems that can cause GI bleeding. Possible causes include:  Esophagitis. This is inflammation, irritation, or swelling of the esophagus.  Hemorrhoids.These are veins that are full of blood (engorged) in the rectum. They cause pain, inflammation, and may bleed.  Anal fissures.These are areas of painful tearing which may bleed. They are often caused by passing hard stool.  Diverticulosis.These are pouches that form on the colon over time, with age, and may bleed significantly.  Diverticulitis.This is inflammation in areas with diverticulosis. It can cause pain, fever, and bloody stools, although bleeding is rare.  Polyps and cancer. Colon cancer often starts out as precancerous polyps.  Gastritis and ulcers.Bleeding from the upper gastrointestinal tract (near the stomach) may travel through the intestines and produce black, sometimes tarry, often bad smelling stools. In certain cases, if the bleeding is fast enough, the stools may not be black, but red. This condition may be life-threatening. SYMPTOMS   Vomiting bright red blood or material that looks like coffee grounds.  Bloody, black, or tarry stools. DIAGNOSIS  Your caregiver may diagnose your condition by taking your history and performing a physical exam. More tests may be needed, including:  X-rays and other imaging tests.  Esophagogastroduodenoscopy (EGD). This test uses a flexible, lighted tube to look at your esophagus,  stomach, and small intestine.  Colonoscopy. This test uses a flexible, lighted tube to look at your colon. TREATMENT  Treatment depends on the cause of your bleeding.   For bleeding from the esophagus, stomach, small intestine, or colon, the caregiver doing your EGD or colonoscopy may be able to stop the bleeding as part of the procedure.  Inflammation or infection of the colon can be treated with medicines.  Many rectal problems can be treated with creams, suppositories, or warm baths.  Surgery is sometimes needed.  Blood transfusions are sometimes needed if you have lost a lot of blood. If bleeding is slow, you may be allowed to go home. If there is a lot of bleeding, you will need to stay in the hospital for observation. HOME CARE INSTRUCTIONS   Take any medicines exactly as prescribed.  Keep your stools soft by eating foods that are high in fiber. These foods include whole grains, legumes, fruits, and vegetables. Prunes (1 to 3 a day) work well for many people.  Drink enough fluids to keep your urine clear or pale yellow. SEEK IMMEDIATE MEDICAL CARE IF:   Your bleeding increases.  You feel lightheaded, weak, or you faint.  You have severe cramps in your back or abdomen.  You pass large blood clots in your stool.  Your problems are getting worse. MAKE SURE YOU:   Understand these instructions.  Will watch your condition.  Will get help right away if you are not doing well or get worse.   This information is not intended to replace advice given to you by your health care provider. Make sure you discuss any questions you have with your health care provider.   Document Released: 11/04/2000 Document Revised: 10/24/2012  Document Reviewed: 04/27/2015 Elsevier Interactive Patient Education [employer].

## 2016-07-05 NOTE — Progress Notes (Signed)
Subjective: Had bowel movement today; abdominal pain has improved. No further bleeding.  Objective: Vital signs in last 24 hours: Temp:  [97.8 F (36.6 C)-98.4 F (36.9 C)] 97.8 F (36.6 C) (08/15 0544) Pulse Rate:  [58-63] 63 (08/15 0544) Resp:  [17-18] 17 (08/15 0544) BP: (95-115)/(45-64) 115/60 (08/15 0837) SpO2:  [95 %-99 %] 95 % (08/15 0544) Weight change:  Last BM Date: 07/04/16  PE: GEN:  More alert today. ABD:  Soft, less tender  Lab Results: CBC    Component Value Date/Time   WBC 3.2 (L) 07/05/2016 0543   RBC 3.42 (L) 07/05/2016 0543   HGB 10.2 (L) 07/05/2016 0543   HGB 10.8 (L) 03/14/2016 1358   HCT 31.7 (L) 07/05/2016 0543   HCT 34.0 (L) 03/14/2016 1358   PLT 47 (L) 07/05/2016 0543   PLT 66 (L) 03/14/2016 1358   MCV 92.7 07/05/2016 0543   MCV 88.2 03/14/2016 1358   MCH 29.8 07/05/2016 0543   MCHC 32.2 07/05/2016 0543   RDW 15.8 (H) 07/05/2016 0543   RDW 16.6 (H) 03/14/2016 1358   LYMPHSABS 0.8 06/23/2016 0321   LYMPHSABS 0.6 (L) 03/14/2016 1358   MONOABS 0.5 06/23/2016 0321   MONOABS 0.4 03/14/2016 1358   EOSABS 0.1 06/23/2016 0321   EOSABS 0.1 03/14/2016 1358   BASOSABS 0.0 06/23/2016 0321   BASOSABS 0.0 03/14/2016 1358   CMP     Component Value Date/Time   NA 136 07/05/2016 0543   K 5.1 07/05/2016 0543   CL 108 07/05/2016 0543   CO2 22 07/05/2016 0543   GLUCOSE 117 (H) 07/05/2016 0543   BUN 12 07/05/2016 0543   CREATININE 0.91 07/05/2016 0543   CREATININE 1.26 (H) 03/03/2016 1102   CALCIUM 8.3 (L) 07/05/2016 0543   PROT 7.1 07/04/2016 0250   ALBUMIN 3.2 (L) 07/04/2016 0250   AST 62 (H) 07/04/2016 0250   ALT 47 07/04/2016 0250   ALKPHOS 90 07/04/2016 0250   BILITOT 1.6 (H) 07/04/2016 0250   GFRNONAA >60 07/05/2016 0543   GFRNONAA 62 03/03/2016 1102   GFRAA >60 07/05/2016 0543   GFRAA 71 03/03/2016 1102   Assessment:  1.  Abdominal pain, longstanding, prior CT and endoscopy within the past couple weeks done.  Benign abdominal exam  today.  May have some component of constipation, has improved today after bowel movement. 2.  Cirrhosis with variceal banding of esophagus done couple weeks ago. 3.  Recent hematemesis, mild, non-destabilizing.  No further bleeding over past 24 hours.  Suspect variceal band ulcers, with increased propensity of bleeding due to thrombocytopenia. 4.  Gastric varices s/p BRTO. 5.  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  Plan:  1.  PPI/H2RB therapy, now and indefinitely. 2.  Sucralfate suspension 1 gram po qac/qhs x 6 weeks, then stop. 3.  Miralax 17 g po qd, now and indefinitely. 4.  Minimize narcotic use. 5.  Advance diet as tolerated. 6.  OOBTC, ambulate room/halls as tolerated. 7.  No further GI evaluation planned; can follow-up with Vincent Ochoa (appt is August 22nd at 2pm). 8.  Will sign-off; please call with questions; thank you for the consultation.   Vincent Ochoa 07/05/2016, 11:11 AM   Pager [phone] If no answer or after 5 PM call [phone]

## 2016-07-05 NOTE — Progress Notes (Signed)
Family Medicine Teaching Service Daily Progress Note Intern Pager: [phone]  Patient name: Vincent Ochoa Medical record number: [id number] Date of birth: [birth year] Age: 60 y.o. Gender: male  Primary Care Provider: Ralene Ok, MD Consultants: None Code Status: FULL  Pt Overview and Major Events to Date:  8/14 Admitted for hematemesis  Assessment and Plan: Vincent Ochoa is a 60 y.o. male presenting with 1 day of hematemesis and abdominal pain, as well as left hand cellulitis secondary to apparent bug bite. PMH is significant for alcoholic cirrhosis, CAD, depression, HFpEF, HLD, OSA.  Hematemesis:With accompanying intermittent abdominal pain.  Hx variceal bleeds 6 months ago requiring obliteration of varices (Balloon-Occluded Retrograde Transvenous Obliteration of Gastric Varices). Recent hospitalization 8/1 EGD showed distal esophageal varies without signs of active bleeding post banding.  Hgb stable at 10.2.  - Pepcid IVPB 20 mg BID - per GI, PPI therapy indefinitely and sucralfate suspension 1 gram po qac/qhs x 6 weeks. No further GI tract evalution - regular diet today, if tolerates well can DC home today - Zofran PRN - monitor CBC  Cellulitis of the left hand - likely secondary to insect bite. Warm, red, painful, with apparent puncture site.  Patient is afebrile and with stable vital signs. - IV clindamycin (8/14-8/15), started in ED, de-escalate to PO doxycycline today - observe for improvement, induration outlined with skin marker  Hypotension BP yesterday low 90s/50s likely 2/2 hematemesis. Given fluid with improvement to BP this am 115/60 - holding lasix, spironolactone, imdur  - restart home BP meds if BP improves - monitor BP  Alcoholic cirrhosis:Heavy EtOH use in 1990s. Hx variceal bleeds s/p BRTO in 01/2016, variceal banding 06/2016. Followed by Dr. Michail Sermon. Currently with abdominal distention and accompanying pain.  - IV Pepcid - GI following  HFrEF:S/p  bare metal stent placement in 2015. Last EF 50% (01/2015). Currently on Coreg, Lipitor, aldacton, and imdur.  ASA was stopped at last hospitalization given active bleeding.   - Holding AM lasix, spironolactone, imdur  d/t low BP consider restarting if he becomes hypertensive  Chronic Portal Vein Thrombosis: Found on CT imaging during admission in march 2017. Repeat CT abdomen on 7/31 showed less extensive chronic thrombus compared to imaging from March. GI was consulted on previous hospitalizations and recommendedthat the risks of anticoagulation or ASA81 outweighed the benefits given his significant bleed history.   Depression - Continue home bupropion  OSA - CPAP QHS  FEN/GI: Regular diet, Protonix Prophylaxis: only SCDs given recent active bleed  Disposition: pending  Subjective:  States feels much improved this am. Abd pain is better, only in mid epigastric area but still feels bloating. Denies emesis, dark stools this am. States L hand also feels better but is still painful. Denies fever/chills. Feels hungry and wants to eat food.   Objective: Temp:  [97.8 F (36.6 C)-98.5 F (36.9 C)] 97.8 F (36.6 C) (08/15 0544) Pulse Rate:  [58-67] 63 (08/15 0544) Resp:  [17-18] 17 (08/15 0544) BP: (95-109)/(45-64) 109/58 (08/15 0544) SpO2:  [95 %-99 %] 95 % (08/15 0544) Weight:  [121.7 kg (268 lb 6.4 oz)] 121.7 kg (268 lb 6.4 oz) (08/14 0746) Physical Exam: General: Well-appearing male sits comfortably in bed, NAD Cardiovascular: RRR, no m/r/g Respiratory: CTA bilaterally, no W/R/R Abdomen: +mildly distended and TTP only at mid-epigastric region. No rebound or guarding +normoactive BS, no hepatosplenomegaly Extremities: no LE edema  Laboratory:  Recent Labs Lab 07/04/16 0615 07/04/16 1440 07/05/16 0543  WBC 3.6* 3.1* 3.2*  HGB 10.4* 9.9*  10.2*  HCT 31.4* 30.6* 31.7*  PLT 46* 41* 47*    Recent Labs Lab 07/04/16 0250 07/05/16 0543  NA 136 136  K 4.5 5.1  CL 104 108   CO2 27 22  BUN 13 12  CREATININE 1.08 0.91  CALCIUM 9.2 8.3*  PROT 7.1  --   BILITOT 1.6*  --   ALKPHOS 90  --   ALT 47  --   AST 62*  --   GLUCOSE 128* 117*    Imaging/Diagnostic Tests: No results found.  Bufford Lope, DO 07/05/2016, 7:00 AM PGY-1, [facility] Intern pager: [phone], text pages welcome

## 2016-07-06 ENCOUNTER — Ambulatory Visit (INDEPENDENT_AMBULATORY_CARE_PROVIDER_SITE_OTHER): Payer: Medicaid Other | Admitting: Family Medicine

## 2016-07-06 ENCOUNTER — Encounter: Payer: Self-pay | Admitting: Family Medicine

## 2016-07-06 DIAGNOSIS — I2510 Atherosclerotic heart disease of native coronary artery without angina pectoris: Secondary | ICD-10-CM | POA: Diagnosis not present

## 2016-07-06 DIAGNOSIS — F329 Major depressive disorder, single episode, unspecified: Secondary | ICD-10-CM

## 2016-07-06 DIAGNOSIS — R110 Nausea: Secondary | ICD-10-CM

## 2016-07-06 DIAGNOSIS — K920 Hematemesis: Secondary | ICD-10-CM

## 2016-07-06 DIAGNOSIS — L03114 Cellulitis of left upper limb: Secondary | ICD-10-CM | POA: Diagnosis not present

## 2016-07-06 DIAGNOSIS — R4589 Other symptoms and signs involving emotional state: Secondary | ICD-10-CM

## 2016-07-06 NOTE — Assessment & Plan Note (Signed)
Improving. Pt reports continued pain, but erythema has receded from initial skin markings. No fluctuance or concern for abscess. Will continue doxy until 07/10/16. Pt voiced understanding to call if no improvement.

## 2016-07-06 NOTE — Assessment & Plan Note (Signed)
Pt denies any hematemesis since discharge from hospital. Is compliant with carafate and pantoprazole.

## 2016-07-06 NOTE — Assessment & Plan Note (Signed)
This continues to be a problem for Vincent Ochoa. PHQ9 today is 14. He only wants to take wellbutrin for this. He reports concerns over his marriage and social situation, but seems to be coping well. Pt will f/u with LCSW on 9/18.

## 2016-07-06 NOTE — Progress Notes (Signed)
   HPI Vincent Ochoa appears down today. He reports concerns over his relationship with his wife, particularly their living situation, which includes his brother in law and nephew living with him. As he describes it, he and his wife are the only ones who have jobs and this brings additional financial burden. He reports that he loves his wife, but does not feel supported by her on most occasions. He has seen our LCSW to discuss this situation in the past, and he reports that he has another appointment on 9/18.   Regarding his cellulitis, he is frustrated that it has not completely improved. He is elevating it to decrease the pain, and he reports that he has been taking the doxy.   Regarding his history of hematemesis, he denies any more episodes and reports compliance with pantoprazole and carafate.  CC: hospital followup  CC, SH/smoking status, and VS noted  Objective: BP (!) 128/55   Pulse 78   Temp 98.6 F (37 C) (Oral)   Ht 6\' 3"  (1.905 m)   Wt 270 lb 12.8 oz (122.8 kg)   BMI 33.85 kg/m  Gen: NAD, alert, cooperative, and pleasant. HEENT: NCAT CV: RRR, no murmur Resp: CTAB, no wheezes, non-labored Neuro: Alert and oriented, Speech clear, No gross deficits  Assessment and plan:  Cellulitis of left hand Improving. Pt reports continued pain, but erythema has receded from initial skin markings. No fluctuance or concern for abscess. Will continue doxy until 07/10/16. Pt voiced understanding to call if no improvement.  Feeling down This continues to be a problem for Vincent Ochoa. PHQ9 today is 14. He only wants to take wellbutrin for this. He reports concerns over his marriage and social situation, but seems to be coping well. Pt will f/u with LCSW on 9/18.   Coronary artery disease involving native coronary artery of native heart without angina pectoris BPs were low during recent admission, so imdur, lasix, and spironolactone were held. BP 128/55 today, so will continue with just coreg and  PRN nitroglycerin. F/u 1months  Hematemesis Pt denies any hematemesis since discharge from hospital. Is compliant with carafate and pantoprazole.   Ralene Ok, MD, PGY1 07/06/2016 4:43 PM

## 2016-07-06 NOTE — Assessment & Plan Note (Signed)
BPs were low during recent admission, so imdur, lasix, and spironolactone were held. BP 128/55 today, so will continue with just coreg and PRN nitroglycerin. F/u 20months

## 2016-07-06 NOTE — Patient Instructions (Addendum)
It was a pleasure to see you today! Please make sure that you have a GI follow up appointment with Dr. Michail Sermon. I would like to see you back in 3 months to check on your blood pressure. I would recommend that you visit with the counselor again as you see fit. Call us if you have any problems or questions as well!   Best,  Dr. Lindell Noe

## 2016-08-05 ENCOUNTER — Encounter: Payer: Self-pay | Admitting: Family Medicine

## 2016-08-05 ENCOUNTER — Telehealth: Payer: Self-pay | Admitting: Family Medicine

## 2016-08-05 NOTE — Telephone Encounter (Signed)
Pt wants to know if he can take tylenol with all the other medications he is taking. He feels like he might have th flu. Please advise

## 2016-08-05 NOTE — Telephone Encounter (Signed)
Pt called again about taking tylenol with is medicines. Per dr Gwendlyn Deutscher it is safe for him to take tylenol with his medicines. I also scheduled him a same day appt because he think he has the flu. Deseree Kennon Holter, CMA

## 2016-08-05 NOTE — Progress Notes (Signed)
Vincent Ochoa called to precept patient with me. He his wanting to know if he can take Tylenol as needed with his current medications. I reviewed all meds on file for him currently. Non- will cause adverse effect when used with Tylenol. Tylenol is pretty benign when taken as prescribed. Vincent will discuss with him. F/U with PCP soon for other concern.

## 2016-08-08 ENCOUNTER — Encounter: Payer: Self-pay | Admitting: Internal Medicine

## 2016-08-08 ENCOUNTER — Ambulatory Visit (INDEPENDENT_AMBULATORY_CARE_PROVIDER_SITE_OTHER): Payer: Medicaid Other | Admitting: Internal Medicine

## 2016-08-08 ENCOUNTER — Telehealth: Payer: Self-pay | Admitting: Cardiovascular Disease

## 2016-08-08 DIAGNOSIS — R6883 Chills (without fever): Secondary | ICD-10-CM | POA: Insufficient documentation

## 2016-08-08 DIAGNOSIS — Z23 Encounter for immunization: Secondary | ICD-10-CM

## 2016-08-08 MED ORDER — SUCRALFATE 1 GM/10ML PO SUSP
1.0000 g | Freq: Three times a day (TID) | ORAL | 3 refills | Status: DC
Start: 2016-08-08 — End: 2016-08-19

## 2016-08-08 NOTE — Telephone Encounter (Signed)
I spoke with the pt and he complains of a heaviness in his chest, shoulders and back that feels like a "child is sitting on his chest".  This has been continuous for over one week and improves when he lays down and uses a heating pad.  The pt saw his PCP for a flu like virus today and they advised the pt to contact Cardiology due to heaviness in chest. The pt denies fever at this time but does have chills. In reviewing the pt's medication list I advised that he hold atorvastatin 1 week to see if symptoms improve.  The pt states that he is no longer taking this medication. I reviewed office schedules and did not see any openings for tomorrow.  I advised the pt that I will contact him tomorrow to see how he is feeling.  If the pt has any worsening in symptoms he will proceed to the ER. Pt agreed with plan.

## 2016-08-08 NOTE — Progress Notes (Signed)
   Subjective:    Patient ID: Vincent Ochoa, male    DOB: [birth year], 59 y.o.   MRN: [id number]  Patient presents for same day visit for cold symptoms.   HPI  Patient thinks he may have had the flu last week. Reports chills and episodes of cold sweats. Also reporting general malaise and subjective fevers (Tmax 99.67F four days ago). One episode of emesis four days ago. Reports he stayed in bed all weekend. Ate hot soup and drank orange juice to help with sore throat symptoms. Began feeling better around noon yesterday, and reports symptoms have resolved today. Would like a flu shot today. Also wanting refill for carafate prescribed during last hospitalization.  Review of Systems Some increased WOB when feeling ill last week but since improved. See HPI.     Objective:   Physical Exam  Constitutional: He is oriented to person, place, and time. He appears well-developed and well-nourished. No distress.  HENT:  Head: Normocephalic and atraumatic.  Cardiovascular: Normal rate, regular rhythm and normal heart sounds.   No murmur heard. Pulmonary/Chest: Effort normal and breath sounds normal. No respiratory distress. He has no wheezes.  Neurological: He is alert and oriented to person, place, and time.  Skin: Skin is warm and dry.  Psychiatric: He has a normal mood and affect. His behavior is normal.  Vitals reviewed.     Assessment & Plan:  Chills without fever Now resolved. Possible viral illness which has since resolved spontaneously. Afebrile and well-appearing today. No need for further work-up or medications at this time.   Adin Hector, MD, MPH PGY-2 [facility] Pager [phone]

## 2016-08-08 NOTE — Assessment & Plan Note (Signed)
Now resolved. Possible viral illness which has since resolved spontaneously. Afebrile and well-appearing today. No need for further work-up or medications at this time.

## 2016-08-08 NOTE — Telephone Encounter (Signed)
Pt c/o of Chest Pain: STAT if CP now or developed within 24 hours  1. Are you having CP right now? No  2. Are you experiencing any other symptoms (ex. SOB, nausea, vomiting, sweating)? Sweating and nausea  3. How long have you been experiencing CP? week  4. Is your CP continuous or coming and going? continuous  5. Have you taken Nitroglycerin? No  Pt is getting over the flu he is not having any chest pain but it feels like a child is sitting on his chest ?

## 2016-08-09 ENCOUNTER — Telehealth: Payer: Self-pay | Admitting: *Deleted

## 2016-08-09 NOTE — Telephone Encounter (Signed)
Prior Authorization received from [employer] for [employer]. Medication is not covered by Medicaid. Medication is not listed on Medicaid formulary.  Derl Barrow, RN

## 2016-08-09 NOTE — Telephone Encounter (Signed)
Left message for pt to call back.  No cancellations today.  Dr Burt Knack did have a cancellation on 9/21 so I will go ahead and schedule the pt in this slot for appointment. Appointment information left on the pt's voicemail.

## 2016-08-11 ENCOUNTER — Other Ambulatory Visit: Payer: Self-pay | Admitting: Family Medicine

## 2016-08-11 ENCOUNTER — Ambulatory Visit: Payer: Medicaid Other | Admitting: Cardiovascular Disease

## 2016-08-11 NOTE — Telephone Encounter (Signed)
Pt called and needs a refill on his Lasix, Coreg, and Bupropion called in to his pharmacy. jw

## 2016-08-11 NOTE — Telephone Encounter (Signed)
PA form placed in provider box for completion.  Braulio Kiedrowski L, RN  

## 2016-08-12 ENCOUNTER — Encounter: Payer: Self-pay | Admitting: Cardiovascular Disease

## 2016-08-12 MED ORDER — CARVEDILOL 3.125 MG PO TABS: 3.1250 mg | ORAL_TABLET | Freq: Two times a day (BID) | ORAL | 0 refills | Status: DC

## 2016-08-12 MED ORDER — BUPROPION HCL ER (XL) 150 MG PO TB24: 150.0000 mg | ORAL_TABLET | Freq: Every day | ORAL | 3 refills | Status: DC

## 2016-08-12 NOTE — Telephone Encounter (Signed)
No show for appointment with Dr Burt Knack on 08/11/16. I left the pt a voicemail to contact our office.

## 2016-08-15 NOTE — Telephone Encounter (Signed)
The pt is scheduled for follow-up in our office 08/17/16.

## 2016-08-16 ENCOUNTER — Ambulatory Visit (HOSPITAL_COMMUNITY)
Admission: RE | Admit: 2016-08-16 | Discharge: 2016-08-16 | Disposition: A | Discharge status: Home / Self Care | Payer: Medicaid Other | Source: Ambulatory Visit | Attending: Family Medicine | Admitting: Family Medicine

## 2016-08-16 ENCOUNTER — Emergency Department (HOSPITAL_COMMUNITY): Payer: Medicaid Other

## 2016-08-16 ENCOUNTER — Inpatient Hospital Stay (HOSPITAL_COMMUNITY)
Admission: EM | Admit: 2016-08-16 | Discharge: 2016-08-19 | DRG: 432 | Disposition: A | Discharge status: Home / Self Care | Payer: Medicaid Other | Attending: Family Medicine | Admitting: Family Medicine

## 2016-08-16 ENCOUNTER — Ambulatory Visit (INDEPENDENT_AMBULATORY_CARE_PROVIDER_SITE_OTHER): Payer: Medicaid Other | Admitting: Family Medicine

## 2016-08-16 ENCOUNTER — Encounter (HOSPITAL_COMMUNITY): Payer: Self-pay | Admitting: Emergency Medicine

## 2016-08-16 ENCOUNTER — Other Ambulatory Visit: Payer: Self-pay

## 2016-08-16 VITALS — BP 117/69 | HR 96 | Temp 99.0°F | Wt 276.0 lb

## 2016-08-16 DIAGNOSIS — R110 Nausea: Secondary | ICD-10-CM

## 2016-08-16 DIAGNOSIS — I5042 Chronic combined systolic (congestive) and diastolic (congestive) heart failure: Secondary | ICD-10-CM | POA: Diagnosis present

## 2016-08-16 DIAGNOSIS — K2971 Gastritis, unspecified, with bleeding: Secondary | ICD-10-CM | POA: Diagnosis present

## 2016-08-16 DIAGNOSIS — R5381 Other malaise: Secondary | ICD-10-CM | POA: Diagnosis not present

## 2016-08-16 DIAGNOSIS — D696 Thrombocytopenia, unspecified: Secondary | ICD-10-CM | POA: Diagnosis present

## 2016-08-16 DIAGNOSIS — R188 Other ascites: Secondary | ICD-10-CM

## 2016-08-16 DIAGNOSIS — K219 Gastro-esophageal reflux disease without esophagitis: Secondary | ICD-10-CM | POA: Diagnosis present

## 2016-08-16 DIAGNOSIS — R0602 Shortness of breath: Secondary | ICD-10-CM

## 2016-08-16 DIAGNOSIS — I252 Old myocardial infarction: Secondary | ICD-10-CM | POA: Diagnosis not present

## 2016-08-16 DIAGNOSIS — E669 Obesity, unspecified: Secondary | ICD-10-CM | POA: Diagnosis present

## 2016-08-16 DIAGNOSIS — I2510 Atherosclerotic heart disease of native coronary artery without angina pectoris: Secondary | ICD-10-CM | POA: Diagnosis present

## 2016-08-16 DIAGNOSIS — R079 Chest pain, unspecified: Secondary | ICD-10-CM | POA: Insufficient documentation

## 2016-08-16 DIAGNOSIS — F329 Major depressive disorder, single episode, unspecified: Secondary | ICD-10-CM | POA: Diagnosis present

## 2016-08-16 DIAGNOSIS — Z6834 Body mass index (BMI) 34.0-34.9, adult: Secondary | ICD-10-CM

## 2016-08-16 DIAGNOSIS — Z87891 Personal history of nicotine dependence: Secondary | ICD-10-CM | POA: Diagnosis not present

## 2016-08-16 DIAGNOSIS — G4733 Obstructive sleep apnea (adult) (pediatric): Secondary | ICD-10-CM | POA: Diagnosis present

## 2016-08-16 DIAGNOSIS — Z955 Presence of coronary angioplasty implant and graft: Secondary | ICD-10-CM | POA: Diagnosis not present

## 2016-08-16 DIAGNOSIS — R1084 Generalized abdominal pain: Secondary | ICD-10-CM | POA: Diagnosis not present

## 2016-08-16 DIAGNOSIS — R9431 Abnormal electrocardiogram [ECG] [EKG]: Secondary | ICD-10-CM | POA: Insufficient documentation

## 2016-08-16 DIAGNOSIS — Z79899 Other long term (current) drug therapy: Secondary | ICD-10-CM | POA: Diagnosis not present

## 2016-08-16 DIAGNOSIS — Z8673 Personal history of transient ischemic attack (TIA), and cerebral infarction without residual deficits: Secondary | ICD-10-CM

## 2016-08-16 DIAGNOSIS — E785 Hyperlipidemia, unspecified: Secondary | ICD-10-CM | POA: Diagnosis present

## 2016-08-16 DIAGNOSIS — Z86718 Personal history of other venous thrombosis and embolism: Secondary | ICD-10-CM

## 2016-08-16 DIAGNOSIS — I959 Hypotension, unspecified: Secondary | ICD-10-CM | POA: Diagnosis present

## 2016-08-16 DIAGNOSIS — L299 Pruritus, unspecified: Secondary | ICD-10-CM | POA: Diagnosis not present

## 2016-08-16 DIAGNOSIS — J40 Bronchitis, not specified as acute or chronic: Secondary | ICD-10-CM

## 2016-08-16 DIAGNOSIS — I8511 Secondary esophageal varices with bleeding: Secondary | ICD-10-CM | POA: Diagnosis present

## 2016-08-16 DIAGNOSIS — K7031 Alcoholic cirrhosis of liver with ascites: Principal | ICD-10-CM | POA: Diagnosis present

## 2016-08-16 DIAGNOSIS — K920 Hematemesis: Secondary | ICD-10-CM | POA: Diagnosis present

## 2016-08-16 LAB — URINALYSIS, ROUTINE W REFLEX MICROSCOPIC
BILIRUBIN URINE: NEGATIVE
Glucose, UA: NEGATIVE mg/dL
Hgb urine dipstick: NEGATIVE
KETONES UR: NEGATIVE mg/dL
Leukocytes, UA: NEGATIVE
NITRITE: NEGATIVE
Protein, ur: NEGATIVE mg/dL
Specific Gravity, Urine: 1.008 (ref 1.005–1.030)
pH: 6.5 (ref 5.0–8.0)

## 2016-08-16 LAB — COMPREHENSIVE METABOLIC PANEL
ALBUMIN: 3.1 g/dL — AB (ref 3.5–5.0)
ALK PHOS: 86 U/L (ref 38–126)
ALT: 56 U/L (ref 17–63)
ANION GAP: 8 (ref 5–15)
AST: 73 U/L — ABNORMAL HIGH (ref 15–41)
BUN: 15 mg/dL (ref 6–20)
CALCIUM: 9.1 mg/dL (ref 8.9–10.3)
CO2: 24 mmol/L (ref 22–32)
Chloride: 104 mmol/L (ref 101–111)
Creatinine, Ser: 1.08 mg/dL (ref 0.61–1.24)
GFR calc Af Amer: >60 mL/min (ref >60)
GFR calc non Af Amer: >60 mL/min (ref >60)
GLUCOSE: 101 mg/dL — AB (ref 65–99)
POTASSIUM: 4.2 mmol/L (ref 3.5–5.1)
SODIUM: 136 mmol/L (ref 135–145)
Total Bilirubin: 3.2 mg/dL — ABNORMAL HIGH (ref 0.3–1.2)
Total Protein: 7.3 g/dL (ref 6.5–8.1)

## 2016-08-16 LAB — CBC
HEMATOCRIT: 33.8 % — AB (ref 39.0–52.0)
Hemoglobin: 11.1 g/dL — ABNORMAL LOW (ref 13.0–17.0)
MCH: 30.3 pg (ref 26.0–34.0)
MCHC: 32.8 g/dL (ref 30.0–36.0)
MCV: 92.3 fL (ref 78.0–100.0)
PLATELETS: 59 10*3/uL — AB (ref 150–400)
RBC: 3.66 MIL/uL — AB (ref 4.22–5.81)
RDW: 16.5 % — ABNORMAL HIGH (ref 11.5–15.5)
WBC: 5.9 10*3/uL (ref 4.0–10.5)

## 2016-08-16 LAB — MRSA PCR SCREENING: MRSA by PCR: POSITIVE — AB

## 2016-08-16 LAB — TYPE AND SCREEN
ABO/RH(D): A NEG
Antibody Screen: NEGATIVE

## 2016-08-16 LAB — POC OCCULT BLOOD, ED: FECAL OCCULT BLD: POSITIVE — AB

## 2016-08-16 LAB — LIPASE, BLOOD: LIPASE: 34 U/L (ref 11–51)

## 2016-08-16 LAB — TROPONIN I: Troponin I: 0.03 ng/mL (ref <0.03)

## 2016-08-16 LAB — PROTIME-INR
INR: 1.35
PROTHROMBIN TIME: 16.8 s — AB (ref 11.4–15.2)

## 2016-08-16 LAB — APTT: aPTT: 35 seconds (ref 24–36)

## 2016-08-16 LAB — I-STAT TROPONIN, ED: Troponin i, poc: 0 ng/mL (ref 0.00–0.08)

## 2016-08-16 MED ORDER — ONDANSETRON HCL 4 MG PO TABS: 4.0000 mg | ORAL_TABLET | Freq: Four times a day (QID) | ORAL | Status: DC | PRN

## 2016-08-16 MED ORDER — NITROGLYCERIN 0.4 MG SL SUBL
0.4000 mg | SUBLINGUAL_TABLET | Freq: Once | SUBLINGUAL | Status: AC
  Administered 2016-08-16: 0.4 mg via SUBLINGUAL

## 2016-08-16 MED ORDER — ATORVASTATIN CALCIUM 40 MG PO TABS
40.0000 mg | ORAL_TABLET | Freq: Every day | ORAL | Status: DC
  Administered 2016-08-17 – 2016-08-18 (×2): 40 mg via ORAL
  Filled 2016-08-16 (×2): qty 1

## 2016-08-16 MED ORDER — PANTOPRAZOLE SODIUM 40 MG IV SOLR: 40.0000 mg | Freq: Two times a day (BID) | INTRAVENOUS | Status: DC

## 2016-08-16 MED ORDER — MUPIROCIN 2 % EX OINT
1.0000 "application " | TOPICAL_OINTMENT | Freq: Two times a day (BID) | CUTANEOUS | Status: DC
  Administered 2016-08-17 – 2016-08-19 (×6): 1 via NASAL
  Filled 2016-08-16 (×2): qty 22

## 2016-08-16 MED ORDER — CARVEDILOL 3.125 MG PO TABS
3.1250 mg | ORAL_TABLET | Freq: Two times a day (BID) | ORAL | Status: DC
  Administered 2016-08-17 – 2016-08-19 (×4): 3.125 mg via ORAL
  Filled 2016-08-16 (×5): qty 1

## 2016-08-16 MED ORDER — ACETAMINOPHEN 650 MG RE SUPP: 650.0000 mg | Freq: Four times a day (QID) | RECTAL | Status: DC | PRN

## 2016-08-16 MED ORDER — SODIUM CHLORIDE 0.9% FLUSH
3.0000 mL | Freq: Two times a day (BID) | INTRAVENOUS | Status: DC
  Administered 2016-08-17 – 2016-08-19 (×4): 3 mL via INTRAVENOUS

## 2016-08-16 MED ORDER — BUPROPION HCL ER (XL) 150 MG PO TB24
150.0000 mg | ORAL_TABLET | Freq: Every day | ORAL | Status: DC
  Administered 2016-08-17 – 2016-08-19 (×3): 150 mg via ORAL
  Filled 2016-08-16 (×5): qty 1

## 2016-08-16 MED ORDER — SODIUM CHLORIDE 0.9 % IV SOLN
80.0000 mg | Freq: Once | INTRAVENOUS | Status: AC
  Administered 2016-08-16: 80 mg via INTRAVENOUS
  Filled 2016-08-16: qty 80

## 2016-08-16 MED ORDER — CHLORHEXIDINE GLUCONATE CLOTH 2 % EX PADS
6.0000 | MEDICATED_PAD | Freq: Every day | CUTANEOUS | Status: DC
  Administered 2016-08-18 – 2016-08-19 (×2): 6 via TOPICAL

## 2016-08-16 MED ORDER — ASPIRIN 325 MG PO TABS
325.0000 mg | ORAL_TABLET | Freq: Once | ORAL | Status: AC
  Administered 2016-08-16: 325 mg via ORAL

## 2016-08-16 MED ORDER — SODIUM CHLORIDE 0.9 % IV SOLN
INTRAVENOUS | Status: DC
  Administered 2016-08-16 – 2016-08-17 (×2): via INTRAVENOUS

## 2016-08-16 MED ORDER — SUCRALFATE 1 GM/10ML PO SUSP
1.0000 g | Freq: Three times a day (TID) | ORAL | Status: DC
Start: 2016-08-16 — End: 2016-08-19
  Administered 2016-08-16 – 2016-08-19 (×9): 1 g via ORAL
  Filled 2016-08-16 (×12): qty 10

## 2016-08-16 MED ORDER — IOPAMIDOL (ISOVUE-300) INJECTION 61%
INTRAVENOUS | Status: AC
  Filled 2016-08-16: qty 100

## 2016-08-16 MED ORDER — ACETAMINOPHEN 325 MG PO TABS: 650.0000 mg | ORAL_TABLET | Freq: Four times a day (QID) | ORAL | Status: DC | PRN

## 2016-08-16 MED ORDER — SODIUM CHLORIDE 0.9 % IV SOLN
8.0000 mg/h | INTRAVENOUS | Status: DC
  Administered 2016-08-16: 8 mg/h via INTRAVENOUS
  Filled 2016-08-16 (×2): qty 80

## 2016-08-16 MED ORDER — ISOSORBIDE MONONITRATE ER 30 MG PO TB24
30.0000 mg | ORAL_TABLET | Freq: Every day | ORAL | Status: DC
  Administered 2016-08-17 – 2016-08-19 (×3): 30 mg via ORAL
  Filled 2016-08-16 (×2): qty 1

## 2016-08-16 MED ORDER — OCTREOTIDE LOAD VIA INFUSION
50.0000 ug | Freq: Once | INTRAVENOUS | Status: AC
  Administered 2016-08-16: 50 ug via INTRAVENOUS
  Filled 2016-08-16: qty 25

## 2016-08-16 MED ORDER — ONDANSETRON HCL 4 MG/2ML IJ SOLN
4.0000 mg | Freq: Four times a day (QID) | INTRAMUSCULAR | Status: DC | PRN
  Administered 2016-08-16: 4 mg via INTRAVENOUS
  Filled 2016-08-16: qty 2

## 2016-08-16 MED ORDER — SODIUM CHLORIDE 0.9 % IV SOLN
50.0000 ug/h | INTRAVENOUS | Status: DC
  Administered 2016-08-16 – 2016-08-19 (×7): 50 ug/h via INTRAVENOUS
  Filled 2016-08-16 (×14): qty 1

## 2016-08-16 NOTE — Progress Notes (Signed)
Trop critical value called 0.03. Dr Juleen China notified. Pt is pain free.

## 2016-08-16 NOTE — ED Notes (Signed)
Paged [facility] to [facility]

## 2016-08-16 NOTE — ED Provider Notes (Signed)
[place] DEPT Provider Note   CSN: [id number] Arrival date & time: 08/16/16  1430     History   Chief Complaint Chief Complaint  Patient presents with  . Chest Pain  . Hematemesis    HPI Vincent Ochoa is a 60 y.o. male.  HPI Patient presents with 2 days of productive cough. States that when he starts coughing he then begins vomiting. States he had a small amount of dark blood in the vomit this morning. He has had no further vomiting since. He complains of thoracic back pain that he describes as pressure. Worse with deep breathing and movement. He complains of diffuse abdominal pain. States he's noticed no increased swelling. Past Medical History:  Diagnosis Date  . Arthritis    "hands, knees, back" (07/05/2016)  . Back pain, lumbosacral 2014  . Coronary artery disease    a. Evaluated by Dr. Stanford Breed 2012 - nonobstructive 2007-2008 by cath. b. Abnormal stress test 10/2014 but cath deferred temporarily due to thrombocytopenia. c. 10/2014: readm with AF-RVR/NSTEMI - s/p BMS to mLCx 11/04/14 (mod dz in RCA);  d. relook cath 01/2015 and 06/02/2015 patent stent, nonobs CAD, EF 55-65%.  . Elevated LFTs   . Facial cellulitis 2014   Periorbital  . Fatty liver US - 2012  . Frequent headaches   . GERD (gastroesophageal reflux disease)   . History of blood transfusion 01/2016   "lost alot of blood vomiting"  . Hyperlipidemia   . Morbid obesity ([place])   . Obesity   . OSA on CPAP    severe with AHI 56/hr with successful CPAP titration to 18cm H2O  . PAF (paroxysmal atrial fibrillation) ([place])    a. Episode 10/2014 at time of NSTEMI, spont converted to NSR. Observation for further episodes (not on anticoag at present time due to [facility] = 1, thrombocytopenia).  . Psoriasis   . Sinus bradycardia   . Stroke [facility])    "I've been told I'd had a mini stroke on my left side" (07/05/2016)  . Thrombocytopenia ([place])    a. Onset unclear, but noted 2012. ? 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 s/p prednisone, IVIG.    Patient Active Problem List   Diagnosis Date Noted  . Chills without fever 08/08/2016  . Cellulitis of left hand   . Essential hypertension   . Hematemesis 06/21/2016  . Chronic combined systolic and diastolic congestive heart failure ([place])   . Erectile dysfunction 06/03/2016  . Dyspnea   . Portal vein thrombosis   . Alcoholic cirrhosis of liver with ascites ([place])   . Esophageal varices with bleeding ([place])   . GI bleed 02/04/2016  . Gastrointestinal hemorrhage 02/04/2016  . Blood loss anemia   . Feeling down 01/10/2016  . Chest pain 01/02/2016  . Diastolic heart failure ([place]) 10/07/2015  . Leukopenia 09/15/2015  . Acrochordon 07/18/2015  . PAF (paroxysmal atrial fibrillation) ([place])   . Obese   . Transaminitis 04/23/2015  . Knee pain, bilateral 03/05/2015  . Depression 01/08/2015  . OSA (obstructive sleep apnea)   . Physical deconditioning 12/08/2014  . Hyperlipidemia LDL goal <70 11/05/2014  . NSTEMI (non-ST elevated myocardial infarction) ([place])   . Back pain, lumbosacral 01/04/2013  . Thrombocytopenia ([place]) 11/29/2012  . Decreased libido 08/15/2011  . GERD (gastroesophageal reflux disease) 08/15/2011  . Coronary artery disease involving native coronary artery of native heart without angina pectoris 07/29/2011  . Psoriasis 07/29/2011    Past Surgical History:  Procedure Laterality  Date  . CARDIAC CATHETERIZATION    . CARDIAC CATHETERIZATION N/A 06/02/2015   Procedure: Left Heart Cath and Coronary Angiography;  Surgeon: Jettie Booze, MD;  Location: [place] CV LAB;  Service: Cardiovascular;  Laterality: N/A;  . ESOPHAGOGASTRODUODENOSCOPY N/A 06/22/2016   Procedure: ESOPHAGOGASTRODUODENOSCOPY (EGD);  Surgeon: Clarene Essex, MD;  Location[facility] ENDOSCOPY;  Service: Endoscopy;  Laterality: N/A;  . FACIAL COSMETIC SURGERY  1990s   "rodeo-related injury"  . KNEE ARTHROPLASTY Left 1970's   "put cartilage in  it"  . LAPAROSCOPIC CHOLECYSTECTOMY  2007  . LEFT HEART CATHETERIZATION WITH CORONARY ANGIOGRAM N/A 11/04/2014   Procedure: LEFT HEART CATHETERIZATION WITH CORONARY ANGIOGRAM;  Surgeon: Jettie Booze, MD;  Location: [facility] CATH LAB;  Service: Cardiovascular;  Laterality: N/A;  . LEFT HEART CATHETERIZATION WITH CORONARY ANGIOGRAM N/A 01/28/2015   Procedure: LEFT HEART CATHETERIZATION WITH CORONARY ANGIOGRAM;  Surgeon: Sherren Mocha, MD;  Location: [facility] CATH LAB;  Service: Cardiovascular;  Laterality: N/A;  . RADIOLOGY WITH ANESTHESIA N/A 02/04/2016   Procedure: RADIOLOGY WITH ANESTHESIA;  Surgeon: Medication Radiologist, MD;  Location: [facility];  Service: Radiology;  Laterality: N/A;  . TONSILLECTOMY  1960's       Home Medications    Prior to Admission medications   Medication Sig Start Date End Date Taking? Authorizing Provider  buPROPion (WELLBUTRIN XL) 150 MG 24 hr tablet Take 1 tablet (150 mg total) by mouth daily. 08/12/16  Yes Sela Hilding, MD  carvedilol (COREG) 3.125 MG tablet Take 1 tablet (3.125 mg total) by mouth 2 (two) times daily with a meal. 08/12/16  Yes Sela Hilding, MD  furosemide (LASIX) 80 MG tablet Take 80 mg by mouth 2 (two) times daily.   Yes Historical Provider, MD  isosorbide mononitrate (IMDUR) 30 MG 24 hr tablet Take 30 mg by mouth daily.   Yes Historical Provider, MD  nitroGLYCERIN (NITROSTAT) 0.4 MG SL tablet Place 1 tablet (0.4 mg total) under the tongue every 5 (five) minutes as needed for chest pain (up to 3 doses). 10/31/14  Yes Dayna N Dunn, PA-C  pantoprazole (PROTONIX) 40 MG tablet Take 1 tablet (40 mg total) by mouth 2 (two) times daily. 06/03/16  Yes Sela Hilding, MD  spironolactone (ALDACTONE) 100 MG tablet Take 100 mg by mouth daily.   Yes Historical Provider, MD  atorvastatin (LIPITOR) 40 MG tablet Take 1 tablet (40 mg total) by mouth daily at 8 pm. 06/23/16   Everrett Coombe, MD  doxycycline (VIBRA-TABS) 100 MG tablet Take 1 tablet (100 mg total)  by mouth every 12 (twelve) hours. 07/05/16   Elsia Nigel Sloop, DO  sucralfate (CARAFATE) 1 GM/10ML suspension Take 10 mLs (1 g total) by mouth 4 (four) times daily -  with meals and at bedtime. 08/08/16   Verner Mould, MD    Family History Family History  Problem Relation Age of Onset  . Cirrhosis Mother   . Heart attack Mother   . Hypertension Mother   . Hypertension Father   . Stroke Father   . Arthritis Maternal Grandmother   . Heart disease Sister   . Diabetes Sister   . Cancer - Ovarian Sister   . Heart disease      Maternal/paternal grandparents  . Heart attack Maternal Aunt     Social History Social History  Substance Use Topics  . Smoking status: Former Smoker    Packs/day: 1.50    Years: 43.00    Types: Cigarettes    Quit date: 02/19/2016  . Smokeless  tobacco: Former Systems developer  . Alcohol use 0.0 oz/week     Comment: 07/05/2016 "mostly stopped in 1979; might have a beer q 6 months or so"     Allergies   Asa [aspirin]   Review of Systems Review of Systems  Constitutional: Negative for chills and fever.  HENT: Negative for congestion and sore throat.   Respiratory: Positive for cough. Negative for shortness of breath and wheezing.   Cardiovascular: Negative for chest pain, palpitations and leg swelling.  Gastrointestinal: Positive for abdominal pain and vomiting. Negative for abdominal distention, blood in stool, constipation, diarrhea and nausea.  Genitourinary: Negative for dysuria, flank pain, frequency and hematuria.  Musculoskeletal: Positive for back pain and myalgias. Negative for arthralgias, joint swelling, neck pain and neck stiffness.  Skin: Negative for rash and wound.  Neurological: Negative for dizziness, weakness, light-headedness, numbness and headaches.  All other systems reviewed and are negative.    Physical Exam Updated Vital Signs BP 99/62   Pulse 78   Temp 98.8 F (37.1 C) (Oral)   Resp 10   SpO2 99%   Physical Exam    Constitutional: He is oriented to person, place, and time. He appears well-developed and well-nourished. No distress.  HENT:  Head: Normocephalic and atraumatic.  Mouth/Throat: Oropharynx is clear and moist. No oropharyngeal exudate.  Eyes: EOM are normal. Pupils are equal, round, and reactive to light.  Neck: Normal range of motion. Neck supple. No JVD present.  Cardiovascular: Normal rate and regular rhythm.  Exam reveals no gallop and no friction rub.   No murmur heard. Pulmonary/Chest: Effort normal and breath sounds normal. No respiratory distress. He has no wheezes. He has no rales. He exhibits no tenderness.  Abdominal: Soft. Bowel sounds are normal. He exhibits distension. There is tenderness. There is no rebound and no guarding.  Diffuse abdominal tenderness to palpation. No definite rebound or guarding. Does appear to have fluid wave  Musculoskeletal: Normal range of motion. He exhibits no edema or tenderness.  Patient with tears to palpation over the thoracic paraspinal muscles. No midline tenderness. No flank tenderness. No lower extremity swelling or pain.  Neurological: He is alert and oriented to person, place, and time.  Moves all extremities without deficit. Sensation is fully intact.  Skin: Skin is warm and dry. Capillary refill takes less than 2 seconds. No rash noted. No erythema.  Psychiatric: He has a normal mood and affect. His behavior is normal.  Nursing note and vitals reviewed.    ED Treatments / Results  Labs (all labs ordered are listed, but only abnormal results are displayed) Labs Reviewed  CBC - Abnormal; Notable for the following:       Result Value   RBC 3.66 (*)    Hemoglobin 11.1 (*)    HCT 33.8 (*)    RDW 16.5 (*)    Platelets 59 (*)    All other components within normal limits  COMPREHENSIVE METABOLIC PANEL - Abnormal; Notable for the following:    Glucose, Bld 101 (*)    Albumin 3.1 (*)    AST 73 (*)    Total Bilirubin 3.2 (*)    All  other components within normal limits  PROTIME-INR - Abnormal; Notable for the following:    Prothrombin Time 16.8 (*)    All other components within normal limits  POC OCCULT BLOOD, ED - Abnormal; Notable for the following:    Fecal Occult Bld POSITIVE (*)    All other components within normal limits  LIPASE, BLOOD  URINALYSIS, ROUTINE W REFLEX MICROSCOPIC (NOT AT [facility])  APTT  I-STAT TROPOININ, ED  TYPE AND SCREEN    EKG  EKG Interpretation  Date/Time:  Tuesday August 16 2016 14:31:54 EDT Ventricular Rate:  90 PR Interval:    QRS Duration: 100 QT Interval:  376 QTC Calculation: 461 R Axis:   68 Text Interpretation:  Sinus rhythm Abnormal R-wave progression, early transition Borderline T abnormalities, inferior leads Confirmed by Lita Mains  MD, Malary Aylesworth ([id number]) on 08/16/2016 6:25:48 PM       Radiology Dg Chest 2 View  Result Date: 08/16/2016 CLINICAL DATA:  Midsternal chest pain and pain between shoulder blades for 12 hours. History of MI, cardiac stents, atrial fibrillation, cirrhosis. Swollen abdomen. EXAM: CHEST  2 VIEW COMPARISON:  Chest x-rays dated 02/17/2016, 02/03/2016 and 01/01/2016. FINDINGS: The heart size and mediastinal contours are within normal limits. Both lungs are clear. The visualized skeletal structures are unremarkable. IMPRESSION: No active cardiopulmonary disease. Electronically Signed   By: Franki Cabot M.D.   On: 08/16/2016 16:19   Ct Abdomen Pelvis W Contrast  Result Date: 08/16/2016 CLINICAL DATA:  Upper left and lower abdominal pain for 3 days. EXAM: CT ABDOMEN AND PELVIS WITH CONTRAST TECHNIQUE: Multidetector CT imaging of the abdomen and pelvis was performed using the standard protocol following bolus administration of intravenous contrast. CONTRAST:  100 mL of Isovue 370 COMPARISON:  June 20, 2016 FINDINGS: Lower chest: A 5.5 mm nodule in the anterior right lung the on series 205, image 6 is stable. No other abnormalities are seen within the lung  bases. Hepatobiliary: A nodular contour to the liver is again identified, consistent with cirrhosis. Low-attenuation near the hepatic dome on image 14 is probably a tiny cyst. No other hepatic masses are seen. The patient is status post cholecystectomy. Thrombus in the portal vein near the confluence with the splenic and superior mesenteric veins on image 38 is nonocclusive and slightly smaller in the interval. This is not an acute finding. Pancreas: Unremarkable. No pancreatic ductal dilatation or surrounding inflammatory changes. Spleen: Splenomegaly is unchanged with no splenic masses. Adrenals/Urinary Tract: Nonobstructive renal stones are again identified with no ureterectasis or ureteral stones. No suspicious renal masses. Stomach/Bowel: Evaluation of the bowel is limited without oral contrast. There is a small duodenal diverticulum of no acute consequence. The stomach and small bowel are otherwise normal. Colonic diverticuli are seen without diverticulitis. The colon and visualized portions of the appendix are otherwise normal. Vascular/Lymphatic: Atherosclerotic changes seen in the abdominal aorta, extending into the iliac vessels. Adenopathy in the mesenteries and retroperitoneum is stable. Reproductive: Uterus and bilateral adnexa are unremarkable. Other: No other abnormalities identified.  Marked ascites. Musculoskeletal: No change in the bones. IMPRESSION: 1. Cirrhosis, splenomegaly, and ascites. 2. Thrombus in the portal vein at the confluence with the splenic and superior mesenteric veins continues but is smaller in the interval. This is not an acute finding. 3. Adenopathy in the abdomen may be reactive, secondary to cirrhosis. Recommend attention on follow-up. 4. Atherosclerotic change in the aorta and iliac vessels. Electronically Signed   By: Dorise Bullion III M.D   On: 08/16/2016 17:43    Procedures Procedures (including critical care time)  Medications Ordered in ED Medications   iopamidol (ISOVUE-300) 61 % injection (not administered)  octreotide (SANDOSTATIN) 2 mcg/mL load via infusion 50 mcg (not administered)    And  octreotide (SANDOSTATIN) 500 mcg in sodium chloride 0.9 % 250 mL (2 mcg/mL) infusion (not administered)  pantoprazole (PROTONIX)  80 mg in sodium chloride 0.9 % 100 mL IVPB (not administered)  pantoprazole (PROTONIX) 80 mg in sodium chloride 0.9 % 250 mL (0.32 mg/mL) infusion (not administered)  pantoprazole (PROTONIX) injection 40 mg (not administered)     Initial Impression / Assessment and Plan / ED Course  I have reviewed the triage vital signs and the nursing notes.  Pertinent labs & imaging results that were available during my care of the patient were reviewed by me and considered in my medical decision making (see chart for details).  Clinical Course   Patient with no active hematemesis. He was guaiac positive on rectal exam. Hemoglobin is stable at 11. Blood pressure with systolics in the 0000000 and [id number]. Normal heart rate. Discussed with family medicine service and will admit to step down. Also status discussed with [facility] gastroenterologist and have advised starting octreotide and Protonix. Will see patient in the morning.    Final Clinical Impressions(s) / ED Diagnoses   Final diagnoses:  Hematemesis with nausea  Bronchitis  Generalized abdominal pain    New Prescriptions New Prescriptions   No medications on file     Julianne Rice, MD 08/16/16 1826

## 2016-08-16 NOTE — Telephone Encounter (Signed)
Called [employer] to complete PA for Carafate.  Per Representative patient does not have active medicaid.  Patient's coverage ended July 21, 2016.  Patient would need to contact his case manager regarding his medicaid status.  [facility] is aware.  They will contact the pharmacy.  Derl Barrow, RN

## 2016-08-16 NOTE — ED Triage Notes (Addendum)
Pt from PCP via [facility] with c/o non radiating chest pressure starting today.  Pt reports he has had several episodes of dark bloody emesis starting yesterday with RLQ and LUQ pain.  Pt reports feeling lightheaded/dizzy on standing.  Pt able to ambulate with 2 person assist from stretcher to stretcher.  Given 324 mg aspirin and 2 nitro with no change in pain.  NAD, A&O.

## 2016-08-16 NOTE — ED Notes (Signed)
Paged [facility] to [facility]

## 2016-08-16 NOTE — H&P (Signed)
Vincent Ochoa Service Pager: [phone]  Patient name: Vincent ALLEYNE Medical record number: [id number] Date of birth: [birth year] Age: 60 y.o. Gender: male  Primary Care Provider: Ralene Ok, MD Consultants: GI  Code Status: FULL   Chief Complaint: Hematemesis, Chest Pressure  Assessment and Plan: SUREN Ochoa is a 60 y.o. male presenting with 1 day of hematemesis and chest pressure. PMH is significant for alcoholic cirrhosis, CAD, depression, HFpEF, HLD, OSA.  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 Patient admitted for hematemesis August 14. GI was consulted and recommended PPI therapy indefinitely and sucralfate suspension for 6 weeks. Most recent EGD on record was performed on 8/2 and showed distal esophageal varies without signs of active bleeding post banding. GI had planned for outpatient EGD one month after this with additional banding if needed. Patient is unsure if this EGD was performed. Hgb stable at 11.1. Patient is mildly hypotensive. At last hospitalization, patient was hypotensive as well and cardiac medications were adjusted at time of discharge. Vital signs otherwise stable.Type and screen already performed (A-). Lipase WNL at 34. FOBT+.  - Admit to SDU, vital signs per unit  - continue octreotide infusion  -Protonix IV q12h  -continue home carafate  - GI consulted by ED provider, appreciate recs  - NPO at [facility] for possible GI procedure  - Zofran PRN - gentle hydration with NS@100cc /hr for soft BPs  - repeat CBC at [facility] and then 5 am  -insert 2nd large bore PIV    Chest Pressure: Risk factors for ACS: CAD and Obesity. iStat troponin negative and EKG without acute ST segment changes. Pain was originally radiating to back but is now much improved so doubt aortic dissection.  -EKG in AM  -trend  troponins   Alcoholic cirrhosis:Heavy EtOH use in 1990s. Hx variceal bleeds s/p BRTO in 01/2016, variceal banding 06/2016. Followed by Dr. Michail Sermon.  - IV Pepcid - Consult GI  -holding spironolactone and lasix given low BPs   CAD: Patient with bare metal stent placement in 2015. Last cath in July 2016 Patient is not on anti-platelet therapy given history of recurrent GI bleeds.  -continue home Lipitor   HFrEF:  Last EF 50% (01/2015). Imdur, Lasix and Spironolactone were discontinued at recent hospitalization due to hypotension. However, patient believes he may still be taking some of these medications and reports taking Lasix 80 mg this morning.  - continue Coreg and Imdur  -hold Lasix and Spironolactone   Chronic Portal Vein Thrombosis: Found on CT imaging during admission in march 2017.  Seen on CT abdomen of this admission but smaller. GI was consulted at that time and recommendedthat the risks of anticoagulation outweighed the benefits given his significant bleed history.   Depression - Continue home bupropion  OSA - CPAP QHS  FEN/GI: Heart healthy diet then NPO at [facility], NS at 100 cc/hr  Prophylaxis: SCDs given GI bleed   Disposition: Admit to FPTS; Attending [place]   History of Present Illness:  Vincent Ochoa is a 60 y.o. male presenting with hematemesis and chest pressure. Was seen in [facility] earlier today and given report of chest pressure was sent to ED for further evaluation. Patient reports that he had 3-4 episodes of hematemesis last night with quarter sized amounts of coffee ground vomitus. Coughed up some blood streaked sputum this morning as well. Denies blood in stool or melena.   Additionally last  night had chest pressure. Denies actual chest pain. This was located sub-sternally with radiation to the back. Denies radiation to the jaw or arms. Reports some SOB with exertion that has been occurring for several weeks. Chest pressure is now improved.   Review Of Systems:  Per HPI with the following additions: Mild nausea. Abdominal distention.   ROS  Patient Active Problem List   Diagnosis Date Noted  . Chills without fever 08/08/2016  . Cellulitis of left hand   . Essential hypertension   . Hematemesis 06/21/2016  . Chronic combined systolic and diastolic congestive heart failure ([place])   . Erectile dysfunction 06/03/2016  . Dyspnea   . Portal vein thrombosis   . Alcoholic cirrhosis of liver with ascites ([place])   . Esophageal varices with bleeding ([place])   . GI bleed 02/04/2016  . Gastrointestinal hemorrhage 02/04/2016  . Blood loss anemia   . Feeling down 01/10/2016  . Chest pain 01/02/2016  . Diastolic heart failure ([place]) 10/07/2015  . Leukopenia 09/15/2015  . Acrochordon 07/18/2015  . PAF (paroxysmal atrial fibrillation) ([place])   . Obese   . Transaminitis 04/23/2015  . Knee pain, bilateral 03/05/2015  . Depression 01/08/2015  . OSA (obstructive sleep apnea)   . Ochoa deconditioning 12/08/2014  . Hyperlipidemia LDL goal <70 11/05/2014  . NSTEMI (non-ST elevated myocardial infarction) ([place])   . Back pain, lumbosacral 01/04/2013  . Thrombocytopenia ([place]) 11/29/2012  . Decreased libido 08/15/2011  . GERD (gastroesophageal reflux disease) 08/15/2011  . Coronary artery disease involving native coronary artery of native heart without angina pectoris 07/29/2011  . Psoriasis 07/29/2011    Past Medical History: Past Medical History:  Diagnosis Date  . Arthritis    "hands, knees, back" (07/05/2016)  . Back pain, lumbosacral 2014  . Coronary artery disease    a. Evaluated by Dr. Stanford Breed 2012 - nonobstructive 2007-2008 by cath. b. Abnormal stress test 10/2014 but cath deferred temporarily due to thrombocytopenia. c. 10/2014: readm with AF-RVR/NSTEMI - s/p BMS to mLCx 11/04/14 (mod dz in RCA);  d. relook cath 01/2015 and 06/02/2015 patent stent, nonobs CAD, EF 55-65%.  . Elevated LFTs   . Facial cellulitis 2014   Periorbital  . Fatty liver US -  2012  . Frequent headaches   . GERD (gastroesophageal reflux disease)   . History of blood transfusion 01/2016   "lost alot of blood vomiting"  . Hyperlipidemia   . Morbid obesity ([place])   . Obesity   . OSA on CPAP    severe with AHI 56/hr with successful CPAP titration to 18cm H2O  . PAF (paroxysmal atrial fibrillation) ([place])    a. Episode 10/2014 at time of NSTEMI, spont converted to NSR. Observation for further episodes (not on anticoag at present time due to [facility] = 1, thrombocytopenia).  . Psoriasis   . Sinus bradycardia   . Stroke [facility])    "I've been told I'd had a mini stroke on my left side" (07/05/2016)  . Thrombocytopenia ([place])    a. Onset unclear, but noted 2012. ? 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 s/p prednisone, IVIG.    Past Surgical History: Past Surgical History:  Procedure Laterality Date  . CARDIAC CATHETERIZATION    . CARDIAC CATHETERIZATION N/A 06/02/2015   Procedure: Left Heart Cath and Coronary Angiography;  Surgeon: Jettie Booze, MD;  Location: [facility] CV LAB;  Service: Cardiovascular;  Laterality: N/A;  . ESOPHAGOGASTRODUODENOSCOPY N/A 06/22/2016   Procedure: ESOPHAGOGASTRODUODENOSCOPY (EGD);  Surgeon:  Clarene Essex, MD;  Location: [facility] ENDOSCOPY;  Service: Endoscopy;  Laterality: N/A;  . FACIAL COSMETIC SURGERY  1990s   "rodeo-related injury"  . KNEE ARTHROPLASTY Left 1970's   "put cartilage in it"  . LAPAROSCOPIC CHOLECYSTECTOMY  2007  . LEFT HEART CATHETERIZATION WITH CORONARY ANGIOGRAM N/A 11/04/2014   Procedure: LEFT HEART CATHETERIZATION WITH CORONARY ANGIOGRAM;  Surgeon: Jettie Booze, MD;  Location: [facility] CATH LAB;  Service: Cardiovascular;  Laterality: N/A;  . LEFT HEART CATHETERIZATION WITH CORONARY ANGIOGRAM N/A 01/28/2015   Procedure: LEFT HEART CATHETERIZATION WITH CORONARY ANGIOGRAM;  Surgeon: Sherren Mocha, MD;  Location: [facility] CATH LAB;  Service: Cardiovascular;  Laterality: N/A;  . RADIOLOGY WITH  ANESTHESIA N/A 02/04/2016   Procedure: RADIOLOGY WITH ANESTHESIA;  Surgeon: Medication Radiologist, MD;  Location: [facility];  Service: Radiology;  Laterality: N/A;  . TONSILLECTOMY  1960's    Social History: Social History  Substance Use Topics  . Smoking status: Former Smoker    Packs/day: 1.50    Years: 43.00    Types: Cigarettes    Quit date: 02/19/2016  . Smokeless tobacco: Former Systems developer  . Alcohol use 0.0 oz/week     Comment: 07/05/2016 "mostly stopped in 1979; might have a beer q 6 months or so"   Additional social history: Quit smoking in March. Denies EtoH and drug use.   Please also refer to relevant sections of EMR.  Family History: Family History  Problem Relation Age of Onset  . Cirrhosis Mother   . Heart attack Mother   . Hypertension Mother   . Hypertension Father   . Stroke Father   . Arthritis Maternal Grandmother   . Heart disease Sister   . Diabetes Sister   . Cancer - Ovarian Sister   . Heart disease      Maternal/paternal grandparents  . Heart attack Maternal Aunt     Allergies and Medications: Allergies  Allergen Reactions  . Asa [Aspirin] Other (See Comments)    Previous hemmorhage   No current facility-administered medications on file prior to encounter.    Current Outpatient Prescriptions on File Prior to Encounter  Medication Sig Dispense Refill  . buPROPion (WELLBUTRIN XL) 150 MG 24 hr tablet Take 1 tablet (150 mg total) by mouth daily. 30 tablet 3  . carvedilol (COREG) 3.125 MG tablet Take 1 tablet (3.125 mg total) by mouth 2 (two) times daily with a meal. 60 tablet 0  . nitroGLYCERIN (NITROSTAT) 0.4 MG SL tablet Place 1 tablet (0.4 mg total) under the tongue every 5 (five) minutes as needed for chest pain (up to 3 doses). 25 tablet 3  . pantoprazole (PROTONIX) 40 MG tablet Take 1 tablet (40 mg total) by mouth 2 (two) times daily. 60 tablet 2  . atorvastatin (LIPITOR) 40 MG tablet Take 1 tablet (40 mg total) by mouth daily at 8 pm. 30 tablet 0  .  doxycycline (VIBRA-TABS) 100 MG tablet Take 1 tablet (100 mg total) by mouth every 12 (twelve) hours. 2 tablet 0  . sucralfate (CARAFATE) 1 GM/10ML suspension Take 10 mLs (1 g total) by mouth 4 (four) times daily -  with meals and at bedtime. 420 mL 3    Objective: BP 99/62   Pulse 78   Temp 98.8 F (37.1 C) (Oral)   Resp 10   SpO2 99%  Exam: General: lying in bed in NAD Eyes: EOMI. PERRL.  ENTM: Oropharynx clear. MMM.  Cardiovascular: RRR. No murmurs appreciated.  Respiratory: CTAB. Normal WOB.  Gastrointestinal: +BS,  mild distention without appreciable fluid wave, no hepatosplenomegaly, mild diffuse TTP without rebound or guarding  MSK: Full ROM in all 4 extremities.  Derm: No appreciable rashes.  Neuro: Alert and oriented. No gross deficits.  Psych: Appropriate mood and affect.   Labs and Imaging: CBC BMET   Recent Labs Lab 08/16/16 1459  WBC 5.9  HGB 11.1*  HCT 33.8*  PLT 59*    Recent Labs Lab 08/16/16 1449  NA 136  K 4.2  CL 104  CO2 24  BUN 15  CREATININE 1.08  GLUCOSE 101*  CALCIUM 9.1     Dg Chest 2 View  Result Date: 08/16/2016 CLINICAL DATA:  Midsternal chest pain and pain between shoulder blades for 12 hours. History of MI, cardiac stents, atrial fibrillation, cirrhosis. Swollen abdomen. EXAM: CHEST  2 VIEW COMPARISON:  Chest x-rays dated 02/17/2016, 02/03/2016 and 01/01/2016. FINDINGS: The heart size and mediastinal contours are within normal limits. Both lungs are clear. The visualized skeletal structures are unremarkable. IMPRESSION: No active cardiopulmonary disease. Electronically Signed   By: Franki Cabot M.D.   On: 08/16/2016 16:19   Ct Abdomen Pelvis W Contrast  Result Date: 08/16/2016 CLINICAL DATA:  Upper left and lower abdominal pain for 3 days. EXAM: CT ABDOMEN AND PELVIS WITH CONTRAST TECHNIQUE: Multidetector CT imaging of the abdomen and pelvis was performed using the standard protocol following bolus administration of intravenous  contrast. CONTRAST:  100 mL of Isovue 370 COMPARISON:  June 20, 2016 FINDINGS: Lower chest: A 5.5 mm nodule in the anterior right lung the on series 205, image 6 is stable. No other abnormalities are seen within the lung bases. Hepatobiliary: A nodular contour to the liver is again identified, consistent with cirrhosis. Low-attenuation near the hepatic dome on image 14 is probably a tiny cyst. No other hepatic masses are seen. The patient is status post cholecystectomy. Thrombus in the portal vein near the confluence with the splenic and superior mesenteric veins on image 38 is nonocclusive and slightly smaller in the interval. This is not an acute finding. Pancreas: Unremarkable. No pancreatic ductal dilatation or surrounding inflammatory changes. Spleen: Splenomegaly is unchanged with no splenic masses. Adrenals/Urinary Tract: Nonobstructive renal stones are again identified with no ureterectasis or ureteral stones. No suspicious renal masses. Stomach/Bowel: Evaluation of the bowel is limited without oral contrast. There is a small duodenal diverticulum of no acute consequence. The stomach and small bowel are otherwise normal. Colonic diverticuli are seen without diverticulitis. The colon and visualized portions of the appendix are otherwise normal. Vascular/Lymphatic: Atherosclerotic changes seen in the abdominal aorta, extending into the iliac vessels. Adenopathy in the mesenteries and retroperitoneum is stable. Reproductive: Uterus and bilateral adnexa are unremarkable. Other: No other abnormalities identified.  Marked ascites. Musculoskeletal: No change in the bones. IMPRESSION: 1. Cirrhosis, splenomegaly, and ascites. 2. Thrombus in the portal vein at the confluence with the splenic and superior mesenteric veins continues but is smaller in the interval. This is not an acute finding. 3. Adenopathy in the abdomen may be reactive, secondary to cirrhosis. Recommend attention on follow-up. 4. Atherosclerotic  change in the aorta and iliac vessels. Electronically Signed   By: Dorise Bullion III M.D   On: 08/16/2016 17:43     Nicolette Bang, DO 08/16/2016, 6:47 PM PGY-2, [place] Intern pager: [phone], text pages welcome

## 2016-08-16 NOTE — ED Notes (Signed)
Pt transported to CT ?

## 2016-08-16 NOTE — Progress Notes (Deleted)
Cardiology Office Note:    Date:  08/16/2016   ID:  Vincent Ochoa, DOB 1956/02/23, MRN [id number]  PCP:  Ralene Ok, MD  Cardiologist:  Dr. Sherren Mocha   Electrophysiologist:  ***  Referring MD: Sela Hilding, MD   No chief complaint on file. *** *** History of Present Illness:    Vincent Ochoa is a 60 y.o. male with a hx of ***  CAD s/p BMS to CFX in 10/2014, HL, psoriasis, thrombocytopenia, elevated LFTs, fatty liver, PAFib, OSA.  Admitted in 10/2014 with symptoms consistent with unstable angina. Myoview was abnormal and felt to be intermediate risk. Patient's cardiac catheterization was placed on hold secondary to marked thrombocytopenia and leukopenia. He was followed by hematology (Dr. 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 Patient was treated with IVIG and prednisone in hopes that his platelet count would improve. Plan was to bring him back for elective cardiac catheterization once his plate count improved. However, he was readmitted with chest discomfort and ruled in for NSTEMI. This was complicated by transient atrial fibrillation with RVR. Cardiac catheterization demonstrated high-grade circumflex disease which was treated with a bare-metal stent.  DAPT was recommended x 1 month minimum.  AFib was felt to be driven by ischemia and this was transient.  He was not felt to be an ideal candidate for anticoagulation.  FU Event Monitor demonstrated NSR.  CHADS2-VASc=1 (vascular dsz).  Hematology has felt he could remain on DAPT if PLT count remains > 30K.  However, he was taken off of Plavix after 30 days of DAPT.  Patient had epistaxis while on Plavix.  Last seen by Dr. Sherren Mocha 12/16/14.  He had some edema and Lasix was added.  FU echo is pending.  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    He saw Dr. Alvy Bimler  yesterday and noted that he was having increasing amounts of chest pain and taking prn NTG.  He is added on to my schedule for evaluation.  Patient admits to feeling well post PCI until this past weekend.  Since that time, he has exertional chest pressure and dyspnea.  This seems to come on with more moderate to extreme exertion (CCS 2).  He did have one episode of chest pain at rest several days ago that resolved with NTG.  He had assoc nausea and ? Diaphoresis.  He denies syncope.  He has slept on an incline chronically.  He is supposed to get a CPAP soon.  He thinks his LE edema is getting worse.  Prior CV studies that were reviewed today include:    LHC 7/16 Left Main LM lesion, 20% stenosed. Left Anterior Descending Mid LAD-1 lesion, 25% stenosed. Mid LAD-2 lesion, 20% stenosed. First Diagonal Branch The vessel is small in size. Second Diagonal Branch The vessel is small in size. Ramus Intermedius Ramus lesion, 25% stenosed. Left Circumflex Mid Cx lesion, 25% stenosed. The lesion was previously treated with a bare metal stent six to twelve months ago. Right Coronary Artery Mid RCA lesion, 30% stenosed. The left ventricular size is normal. The left ventricular systolic function is normal. The left ventricular ejection fraction is 55-65% by visual estimate. There are no wall motion abnormalities in the left ventricle. Nonobstructive coronary artery disease. Mild in-stent restenosis as noted above. Continue medical therapy. Continue attempts at smoking cessation.     Carotid US 4/16 Heterogeneous plaque,  bilaterally. 1-39% bilateral ICA stenosis. Patent vertebral arteries with antegrade flow. Normal subclavian arteries, bilaterally. F/U 71yrs  Myoview 3/16 Overall Impression:  Intermediate risk stress nuclear study with a basal to apical inferior and basal to mid inferolateral perfusion defect, medium-sized.  It is partially reversible, suggestive of the presence of ischemia.  Interpretation is  difficult as the stress images are relatively count-poor compared to rest images.  Low EF with inferolateral wall motion abnormality..  LV Ejection Fraction: 43%.  LV Wall Motion:  Inferolateral hypokinesis.    Cardiac Cath and PCI 10/31/14 LM:  mild, distal disease. LAD:  mild disease in the mid vessel.  LCx:  mid 99%  RCA:  mid  50%.  PCI:  2.25 x 12 mm vision BMS stent mid CFX  Echocardiogram 10/29/14 EF 60-65%. Wall motion was normal. Grade 2 diastolic dysfunction Mod LAE Mild RAE Mild TR  Lexiscan Myoview 10/29/14 Mild reversibility surrounding a more chronic infarct in the inferolateral wall, suspicious for ischemia adjacent to a more chronic infarct. Mild hypokinesis. EF 50% Intermediate-risk   Past Medical History:  Diagnosis Date  . Arthritis    "hands, knees, back" (07/05/2016)  . Back pain, lumbosacral 2014  . Coronary artery disease    a. Evaluated by Dr. Stanford Breed 2012 - nonobstructive 2007-2008 by cath. b. Abnormal stress test 10/2014 but cath deferred temporarily due to thrombocytopenia. c. 10/2014: readm with AF-RVR/NSTEMI - s/p BMS to mLCx 11/04/14 (mod dz in RCA);  d. relook cath 01/2015 and 06/02/2015 patent stent, nonobs CAD, EF 55-65%.  . Elevated LFTs   . Facial cellulitis 2014   Periorbital  . Fatty liver US - 2012  . Frequent headaches   . GERD (gastroesophageal reflux disease)   . History of blood transfusion 01/2016   "lost alot of blood vomiting"  . Hyperlipidemia   . Morbid obesity ([place])   . Obesity   . OSA on CPAP    severe with AHI 56/hr with successful CPAP titration to 18cm H2O  . PAF (paroxysmal atrial fibrillation) ([place])    a. Episode 10/2014 at time of NSTEMI, spont converted to NSR. Observation for further episodes (not on anticoag at present time due to [facility] = 1, thrombocytopenia).  . Psoriasis   . Sinus bradycardia   . Stroke [facility])    "I've been told I'd had a mini stroke on my left side" (07/05/2016)  . Thrombocytopenia ([place])      a. Onset unclear, but noted 2012. ? 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 s/p prednisone, IVIG.    Past Surgical History:  Procedure Laterality Date  . CARDIAC CATHETERIZATION    . CARDIAC CATHETERIZATION N/A 06/02/2015   Procedure: Left Heart Cath and Coronary Angiography;  Surgeon: Jettie Booze, MD;  Location: [place] CV LAB;  Service: Cardiovascular;  Laterality: N/A;  . ESOPHAGOGASTRODUODENOSCOPY N/A 06/22/2016   Procedure: ESOPHAGOGASTRODUODENOSCOPY (EGD);  Surgeon: Clarene Essex, MD;  Location: [facility] ENDOSCOPY;  Service: Endoscopy;  Laterality: N/A;  . FACIAL COSMETIC SURGERY  1990s   "rodeo-related injury"  . KNEE ARTHROPLASTY Left 1970's   "put cartilage in it"  . LAPAROSCOPIC CHOLECYSTECTOMY  2007  . LEFT HEART CATHETERIZATION WITH CORONARY ANGIOGRAM N/A 11/04/2014   Procedure: LEFT HEART CATHETERIZATION WITH CORONARY ANGIOGRAM;  Surgeon: Jettie Booze, MD;  Location: [facility] CATH LAB;  Service: Cardiovascular;  Laterality: N/A;  . LEFT HEART CATHETERIZATION WITH CORONARY ANGIOGRAM N/A 01/28/2015   Procedure: LEFT HEART CATHETERIZATION WITH CORONARY ANGIOGRAM;  Surgeon: Legrand Como  Burt Knack, MD;  Location: [facility] CATH LAB;  Service: Cardiovascular;  Laterality: N/A;  . RADIOLOGY WITH ANESTHESIA N/A 02/04/2016   Procedure: RADIOLOGY WITH ANESTHESIA;  Surgeon: Medication Radiologist, MD;  Location: [place];  Service: Radiology;  Laterality: N/A;  . TONSILLECTOMY  1960's    Current Medications: Facility-Administered Medications Prior to Visit  Medication Dose Route Frequency Provider Last Rate Last Dose  . 0.9 %  sodium chloride infusion   Intravenous Continuous Nicolette Bang, DO      . acetaminophen (TYLENOL) tablet 650 mg  650 mg Oral Q6H PRN Nicolette Bang, DO       Or  . acetaminophen (TYLENOL) suppository 650 mg  650 mg Rectal Q6H PRN Nicolette Bang, DO      . [START ON 08/17/2016] atorvastatin (LIPITOR) tablet 40 mg   40 mg Oral q1800 Nicolette Bang, DO      . [START ON 08/17/2016] buPROPion (WELLBUTRIN XL) 24 hr tablet 150 mg  150 mg Oral Daily Nicolette Bang, DO      . [START ON 08/17/2016] carvedilol (COREG) tablet 3.125 mg  3.125 mg Oral BID WC Nicolette Bang, DO      . iopamidol (ISOVUE-300) 61 % injection           . [START ON 08/17/2016] isosorbide mononitrate (IMDUR) 24 hr tablet 30 mg  30 mg Oral Daily Nicolette Bang, DO      . octreotide (SANDOSTATIN) 500 mcg in sodium chloride 0.9 % 250 mL (2 mcg/mL) infusion  50 mcg/hr Intravenous Continuous Julianne Rice, MD   50 mcg/hr at 08/16/16 1847  . ondansetron (ZOFRAN) tablet 4 mg  4 mg Oral Q6H PRN Nicolette Bang, DO       Or  . ondansetron [facility]) injection 4 mg  4 mg Intravenous Q6H PRN Nicolette Bang, DO   4 mg at 08/16/16 2136  . [START ON 08/20/2016] pantoprazole (PROTONIX) injection 40 mg  40 mg Intravenous Q12H Julianne Rice, MD      . sodium chloride flush (NS) 0.9 % injection 3 mL  3 mL Intravenous Q12H Nicolette Bang, DO      . sucralfate (CARAFATE) 1 GM/10ML suspension 1 g  1 g Oral TID WC & HS Nicolette Bang, DO   1 g at 08/16/16 2206   Outpatient Medications Prior to Visit  Medication Sig Dispense Refill  . atorvastatin (LIPITOR) 40 MG tablet Take 1 tablet (40 mg total) by mouth daily at 8 pm. (Patient not taking: Reported on 08/16/2016) 30 tablet 0  . buPROPion (WELLBUTRIN XL) 150 MG 24 hr tablet Take 1 tablet (150 mg total) by mouth daily. 30 tablet 3  . carvedilol (COREG) 3.125 MG tablet Take 1 tablet (3.125 mg total) by mouth 2 (two) times daily with a meal. 60 tablet 0  . cyclobenzaprine (FLEXERIL) 10 MG tablet Take 10 mg by mouth 3 (three) times daily as needed for muscle spasms.    . furosemide (LASIX) 80 MG tablet Take 80 mg by mouth 2 (two) times daily.    . isosorbide mononitrate (IMDUR) 30 MG 24 hr tablet Take 30 mg by mouth daily.    . nitroGLYCERIN  (NITROSTAT) 0.4 MG SL tablet Place 1 tablet (0.4 mg total) under the tongue every 5 (five) minutes as needed for chest pain (up to 3 doses). 25 tablet 3  . pantoprazole (PROTONIX) 40 MG tablet Take 1 tablet (40 mg total) by mouth 2 (two) times daily. [place]  tablet 2  . spironolactone (ALDACTONE) 100 MG tablet Take 100 mg by mouth daily.    . sucralfate (CARAFATE) 1 GM/10ML suspension Take 10 mLs (1 g total) by mouth 4 (four) times daily -  with meals and at bedtime. 420 mL 3      Allergies:   Asa [aspirin]; Eliquis [apixaban]; Nsaids; Plavix [clopidogrel]; and Statins   Social History   Social History  . Marital status: Married    Spouse name: N/A  . Number of children: N/A  . Years of education: N/A   Social History Main Topics  . Smoking status: Former Smoker    Packs/day: 1.50    Years: 43.00    Types: Cigarettes    Quit date: 02/19/2016  . Smokeless tobacco: Former Systems developer  . Alcohol use 0.0 oz/week     Comment: 07/05/2016 "mostly stopped in 1979; might have a beer q 6 months or so"  . Drug use: No  . Sexual activity: Not Currently   Other Topics Concern  . Not on file   Social History Narrative   Lives in [place].    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      Family History:  The patient's ***family history includes Arthritis in his maternal grandmother; Cancer - Ovarian in his sister; Cirrhosis in his mother; Diabetes in his sister; Heart attack in his maternal aunt and mother; Heart disease in his sister; Hypertension in his father and mother; Stroke in his father.   ROS:   Please see the history of present illness.    ROS All other systems reviewed and are negative.   EKGs/Labs/Other Test Reviewed:    EKG:  EKG is *** ordered today.  The ekg ordered today demonstrates ***  Recent Labs: 02/17/2016: B Natriuretic Peptide 18.0 03/03/2016: Magnesium 1.6 08/16/2016: ALT 56; BUN 15; Creatinine,  Ser 1.08; Hemoglobin 11.1; Platelets 59; Potassium 4.2; Sodium 136   Recent Lipid Panel    Component Value Date/Time   CHOL 157 06/02/2015 0635   TRIG 83 02/08/2016 0126   HDL 27 (L) 06/02/2015 0635   CHOLHDL 5.8 06/02/2015 0635   VLDL 15 06/02/2015 0635   LDLCALC 115 (H) 06/02/2015 [id number]     Physical Exam:    VS:  There were no vitals taken for this visit.    Wt Readings from Last 3 Encounters:  08/16/16 272 lb 4.3 oz (123.5 kg)  08/16/16 276 lb (125.2 kg)  08/08/16 281 lb (127.5 kg)     ***Physical Exam  ASSESSMENT:    No diagnosis found. PLAN:    In order of problems listed above:  ***   Medication Adjustments/Labs and Tests Ordered: Current medicines are reviewed at length with the patient today.  Concerns regarding medicines are outlined above.  Medication changes, Labs and Tests ordered today are outlined in the Patient Instructions noted below. There are no Patient Instructions on file for this visit. Signed, Richardson Dopp, PA-C  08/16/2016 10:49 PM    [facility] [address], [place], [place]  [address] Phone: [phone]; Fax: [phone]

## 2016-08-16 NOTE — Progress Notes (Signed)
   CC: Coffee-ground emesis, chest pain  HPI Patient presents for follow-up appointment, chest pain present feels as if something is sitting on his chest 10 out of 10 centrally located. This started last night, EMS was called to his house, he took 3 aspirin and 1 nitroglycerin. These caused minimal change in his pain. EMS strongly encouraged transfer to the hospital, patient was upset with his wife and refused transport.  Additionally, patient has a history of esophageal varices, began vomiting quarter-sized coffee ground emesis around 9:30 PM last night, describes these as black, occurred 4-5 times. No subsequent vomiting this morning.  Patient is out of some medicines, it seems as though he took his PPI, Wellbutrin, Lipitor this morning. Reports being out of Coreg, Carafate. Also endorses taking 80 mg of Lasix this morning although this is not on his current med list.  CC, SH/smoking status, and VS noted  Objective: BP 117/69   Pulse 96   Temp 99 F (37.2 C) (Oral)   Wt 276 lb (125.2 kg)   SpO2 94%   BMI 34.50 kg/m  Gen: Mild distress due to pain, no increased WOB. CV: RRR, no murmur Resp: CTAB, no wheezes, non-labored Abd: diffusely tender, mildly distended, BS present, no guarding or organomegaly Ext: No edema, warm Neuro: Alert and oriented, Speech clear, No gross deficits  Assessment and plan:  Chest pain: Concerning for MI in the setting of possible GI bleed. EKG obtained, nitroglycerin, aspirin 325, oxygen applied. 911 called, EMS arrived rapidly. Patient transported to [facility] ED.  Coffee ground emesis: Concerning for GI bleed versus bleeding varices, in a patient with known esophageal varices. Vital signs stable and clinic, also raises concern for MI in the setting of GI bleed. Would recommend obtaining CBC and possible GI consult. Patient transported to ED for further stabilization.  Orders Placed This Encounter  Procedures  . EKG 12-Lead    Meds ordered this encounter   Medications  . nitroGLYCERIN (NITROSTAT) SL tablet 0.4 mg  . aspirin tablet 325 mg     Ralene Ok, MD, PGY1 08/16/2016 2:15 PM

## 2016-08-17 ENCOUNTER — Inpatient Hospital Stay (HOSPITAL_COMMUNITY): Payer: Medicaid Other | Admitting: Anesthesiology

## 2016-08-17 ENCOUNTER — Encounter (HOSPITAL_COMMUNITY): Payer: Self-pay | Admitting: Anesthesiology

## 2016-08-17 ENCOUNTER — Ambulatory Visit: Payer: Medicaid Other | Admitting: Physician Assistant

## 2016-08-17 ENCOUNTER — Encounter (HOSPITAL_COMMUNITY)
Admission: EM | Disposition: A | Discharge status: Home / Self Care | Payer: Self-pay | Source: Home / Self Care | Attending: Family Medicine

## 2016-08-17 DIAGNOSIS — R5381 Other malaise: Secondary | ICD-10-CM

## 2016-08-17 DIAGNOSIS — R188 Other ascites: Secondary | ICD-10-CM

## 2016-08-17 HISTORY — PX: ESOPHAGOGASTRODUODENOSCOPY (EGD) WITH PROPOFOL: SHX5813

## 2016-08-17 LAB — CBC
HCT: 31.2 % — ABNORMAL LOW (ref 39.0–52.0)
HEMATOCRIT: 30.7 % — AB (ref 39.0–52.0)
HEMOGLOBIN: 10.2 g/dL — AB (ref 13.0–17.0)
Hemoglobin: 10 g/dL — ABNORMAL LOW (ref 13.0–17.0)
MCH: 30.4 pg (ref 26.0–34.0)
MCH: 30.5 pg (ref 26.0–34.0)
MCHC: 32.6 g/dL (ref 30.0–36.0)
MCHC: 32.7 g/dL (ref 30.0–36.0)
MCV: 93.3 fL (ref 78.0–100.0)
MCV: 93.4 fL (ref 78.0–100.0)
PLATELETS: 46 10*3/uL — AB (ref 150–400)
PLATELETS: 47 10*3/uL — AB (ref 150–400)
RBC: 3.29 MIL/uL — AB (ref 4.22–5.81)
RBC: 3.34 MIL/uL — AB (ref 4.22–5.81)
RDW: 16.4 % — ABNORMAL HIGH (ref 11.5–15.5)
RDW: 16.4 % — ABNORMAL HIGH (ref 11.5–15.5)
WBC: 3.5 10*3/uL — AB (ref 4.0–10.5)
WBC: 3.8 10*3/uL — AB (ref 4.0–10.5)

## 2016-08-17 LAB — BASIC METABOLIC PANEL
ANION GAP: 3 — AB (ref 5–15)
BUN: 15 mg/dL (ref 6–20)
CALCIUM: 8.2 mg/dL — AB (ref 8.9–10.3)
CO2: 25 mmol/L (ref 22–32)
Chloride: 108 mmol/L (ref 101–111)
Creatinine, Ser: 1.12 mg/dL (ref 0.61–1.24)
GLUCOSE: 165 mg/dL — AB (ref 65–99)
POTASSIUM: 4 mmol/L (ref 3.5–5.1)
Sodium: 136 mmol/L (ref 135–145)

## 2016-08-17 LAB — TROPONIN I: Troponin I: <0.03 ng/mL (ref <0.03)

## 2016-08-17 SURGERY — ESOPHAGOGASTRODUODENOSCOPY (EGD) WITH PROPOFOL
Anesthesia: Monitor Anesthesia Care

## 2016-08-17 MED ORDER — SODIUM CHLORIDE 0.9 % IV SOLN: Freq: Once | INTRAVENOUS | Status: DC

## 2016-08-17 MED ORDER — BUTAMBEN-TETRACAINE-BENZOCAINE 2-2-14 % EX AERO
INHALATION_SPRAY | CUTANEOUS | Status: DC | PRN
  Administered 2016-08-17: 2 via TOPICAL

## 2016-08-17 MED ORDER — PROPOFOL 500 MG/50ML IV EMUL
INTRAVENOUS | Status: DC | PRN
  Administered 2016-08-17: 75 ug/kg/min via INTRAVENOUS

## 2016-08-17 MED ORDER — LACTULOSE 10 GM/15ML PO SOLN
10.0000 g | Freq: Two times a day (BID) | ORAL | Status: DC
  Administered 2016-08-17 – 2016-08-18 (×2): 10 g via ORAL
  Filled 2016-08-17 (×2): qty 15

## 2016-08-17 MED ORDER — PANTOPRAZOLE SODIUM 40 MG PO TBEC
40.0000 mg | DELAYED_RELEASE_TABLET | Freq: Every day | ORAL | Status: DC
  Administered 2016-08-17 – 2016-08-19 (×3): 40 mg via ORAL
  Filled 2016-08-17 (×3): qty 1

## 2016-08-17 MED ORDER — SODIUM CHLORIDE 0.9 % IV SOLN: INTRAVENOUS | Status: DC

## 2016-08-17 MED ORDER — PROPOFOL 10 MG/ML IV BOLUS
INTRAVENOUS | Status: DC | PRN
  Administered 2016-08-17: 20 mg via INTRAVENOUS

## 2016-08-17 MED ORDER — INFLUENZA VAC SPLIT QUAD 0.5 ML IM SUSY: 0.5000 mL | PREFILLED_SYRINGE | INTRAMUSCULAR | Status: DC

## 2016-08-17 MED ORDER — LIDOCAINE HCL (CARDIAC) 20 MG/ML IV SOLN
INTRAVENOUS | Status: DC | PRN
  Administered 2016-08-17: 50 mg via INTRAVENOUS

## 2016-08-17 MED ORDER — DEXTROSE 5 % IV SOLN
2.0000 g | INTRAVENOUS | Status: DC
  Administered 2016-08-17 – 2016-08-19 (×3): 2 g via INTRAVENOUS
  Filled 2016-08-17 (×3): qty 2

## 2016-08-17 NOTE — Anesthesia Postprocedure Evaluation (Signed)
Anesthesia Post Note  Patient: Vincent Ochoa  Procedure(s) Performed: Procedure(s) (LRB): ESOPHAGOGASTRODUODENOSCOPY (EGD) WITH PROPOFOL (N/A)  Patient location during evaluation: PACU Anesthesia Type: MAC Level of consciousness: awake and alert Pain management: pain level controlled Vital Signs Assessment: post-procedure vital signs reviewed and stable Respiratory status: spontaneous breathing, nonlabored ventilation, respiratory function stable and patient connected to nasal cannula oxygen Cardiovascular status: stable and blood pressure returned to baseline Anesthetic complications: no    Last Vitals:  Vitals:   08/17/16 1230 08/17/16 1240  BP: 103/61 (!) 104/59  Pulse: 69 70  Resp: 13 14  Temp:      Last Pain:  Vitals:   08/17/16 1101  TempSrc: Oral  PainSc:                  [name]

## 2016-08-17 NOTE — Anesthesia Procedure Notes (Signed)
Procedure Name: MAC Date/Time: 08/17/2016 11:42 AM Performed by: Jenne Campus Pre-anesthesia Checklist: Patient identified, Emergency Drugs available, Suction available, Patient being monitored and Timeout performed Patient Re-evaluated:Patient Re-evaluated prior to inductionOxygen Delivery Method: Nasal cannula

## 2016-08-17 NOTE — Anesthesia Preprocedure Evaluation (Addendum)
Anesthesia Evaluation  Patient identified by MRN, date of birth, ID band Patient awake    Reviewed: Allergy & Precautions, NPO status , Patient's Chart, lab work & pertinent test results  Airway Mallampati: I  TM Distance: >3 FB     Dental  (+) Poor Dentition   Pulmonary sleep apnea , Current Smoker, former smoker,    breath sounds clear to auscultation       Cardiovascular hypertension, + CAD, + Past MI and + Cardiac Stents (BMS 2015, not on aspirin due to recurrent GI bleeds)  + dysrhythmias Atrial Fibrillation  Rhythm:Regular Rate:Normal     Neuro/Psych  Headaches, PSYCHIATRIC DISORDERS Depression CVA    GI/Hepatic GERD  ,(+) Cirrhosis   Esophageal Varices    , Fatty liver disease with varices   Endo/Other  negative endocrine ROS  Renal/GU negative Renal ROS     Musculoskeletal  (+) Arthritis ,   Abdominal   Peds  Hematology  (+) anemia ,   Anesthesia Other Findings   Reproductive/Obstetrics                            Anesthesia Physical  Anesthesia Plan  ASA: IV  Anesthesia Plan: MAC   Post-op Pain Management:    Induction: Intravenous  Airway Management Planned: Nasal Cannula  Additional Equipment:   Intra-op Plan:   Post-operative Plan:   Informed Consent: I have reviewed the patients History and Physical, chart, labs and discussed the procedure including the risks, benefits and alternatives for the proposed anesthesia with the patient or authorized representative who has indicated his/her understanding and acceptance.   Dental advisory given  Plan Discussed with: CRNA, Anesthesiologist and Surgeon  Anesthesia Plan Comments:         Anesthesia Quick Evaluation

## 2016-08-17 NOTE — Progress Notes (Signed)
PT Cancellation Note  Patient Details Name: Vincent Ochoa MRN: [id number] DOB: 1956/08/16   Cancelled Treatment:    Reason Eval/Treat Not Completed: Patient at procedure or test/unavailable (Pt in endo.  Will check back as able. )Thanks.    Irwin Brakeman F 08/17/2016, 11:09 AM [facility] [phone] (pager)

## 2016-08-17 NOTE — Progress Notes (Signed)
Family Medicine Teaching Service Daily Progress Note Intern Pager: [phone]  Patient name: Vincent Ochoa Medical record number: [id number] Date of birth: 1956-09-22 Age: 60 y.o. Gender: male  Primary Care Provider: Ralene Ok, MD Consultants: GI Code Status: FULL  Pt Overview and Major Events to Date:  9/26 seen in [facility] c/w chest pressure and hematemesis, transported via EMS. ED eval without acute EKG changes, Hgb stable.  9/27 GI consulted, EGD scheduled  Assessment and Plan: Vincent Ochoa a 60 y.o.malepresenting with 1 day of hematemesis and chest pressure. PMH is significant for alcoholic cirrhosis, CAD, depression, HFpEF, HLD, OSA.  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 Patient admitted for hematemesis August 14. GI was consulted and recommended PPI therapy indefinitely and sucralfate suspension for 6 weeks. Most recent EGD on record was performed on 8/2 and showed distal esophageal varies without signs of active bleeding post banding. GI had planned for outpatient EGD one month after this with additional banding if needed. Patient is unsure if this EGD was performed.Hgb stable at 11.1. Patient is mildly hypotensive. At last hospitalization, patient was hypotensive as well and cardiac medications were adjusted at time of discharge. Vital signs otherwise stable.Type and screen already performed (A-). Lipase WNL at 34. FOBT+. Hgb 11.1 >10.2>10.0 - continue octreotide infusion  -Protonix IV q12h  -continue home carafate  - GI consulted by ED provider, appreciate recs: EGD with prophylactic rocephin; transfuse platelets - Zofran PRN - gentle hydration with NS@100cc /hr for soft BPs   Chest Pressure: Risk factors for ACS: CAD and Obesity. iStat troponin negative and EKG without acute ST segment changes. Pain was originally radiating to back but is now much improved so  doubt aortic dissection. Trop 0.00> 0.03 > <0.03. -EKG PRN chest pain  Alcoholic cirrhosis with ascites:Heavy EtOH use in 1990s. Hx variceal bleeds s/p BRTO in 01/2016, variceal banding 06/2016. Followed by Dr. Michail Sermon.CT abdomen with marked ascites, fluid wave present on exam, would like to give paracentesis, will check with GI on whether this should be today or tomorrow. Child Pugh C score 10 (life expectancy 1-3 years, abdominal surgery peri op mortality 82%). - IV protonix - Consult GI as above -consider paracentesis today -holding spironolactone and lasix given low BPs   CAD: Patient with bare metal stent placement in 2015. Last cath in July 2016 Patient is not on anti-platelet therapy given history of recurrent GI bleeds.  -continue home Lipitor   HFrEF:  Last EF 50% (01/2015). Imdur, Lasix and Spironolactone were discontinued at recent hospitalization due to hypotension. However, patient believes he may still be taking some of these medications and reports taking Lasix 80 mg this morning.  -continue Coreg and Imdur  -hold Lasix and Spironolactone   Chronic Portal Vein Thrombosis: Found on CT imaging during admission in march 2017.  Seen on CT abdomen of this admission but smaller. GI was consulted at that time and recommendedthat the risks of anticoagulation outweighed the benefits given his significant bleed history.   Depression - Continue home bupropion  OSA - CPAP QHS  FEN/GI: heart healthy diet after EGD, NS @100cc /hr, IV PPI and octreotide PPx: SCDs given GI bleed  Disposition: continued management in stepdown  Subjective:  Pt feels ok this morning, wants to know why he is tired all the time.   Objective: Temp:  [97 F (36.1 C)-99 F (37.2 C)] 97 F (36.1 C) (09/27 0733) Pulse Rate:  [64-96] 72 (09/27 0733)  Resp:  [10-24] 12 (09/27 0733) BP: (93-125)/(49-70) 110/66 (09/27 0733) SpO2:  [90 %-100 %] 97 % (09/27 0733) Weight:  [272 lb 4.3 oz (123.5 kg)-276  lb (125.2 kg)] 272 lb 4.3 oz (123.5 kg) (09/26 2010) Physical Exam: General: Overweight male lying in bed in NAD Cardiovascular: RRR. No murmurs appreciated.  Respiratory: CTAB. Normal WOB.  Gastrointestinal: +BS, moderate distention + fluid wave, mild hepatomegaly, diffusely TTP without rebound or guarding  MSK: Full ROM in all 4 extremities.  Neuro: Alert and oriented. No gross deficits.  Psych: Appropriate mood and affect. Continues to be melancholy, although this is not an acute change.  Laboratory:  Recent Labs Lab 08/16/16 1459 08/16/16 2334 08/17/16 0251  WBC 5.9 3.8* 3.5*  HGB 11.1* 10.2* 10.0*  HCT 33.8* 31.2* 30.7*  PLT 59* 47* 46*    Recent Labs Lab 08/16/16 1449 08/17/16 0251  NA 136 136  K 4.2 4.0  CL 104 108  CO2 24 25  BUN 15 15  CREATININE 1.08 1.12  CALCIUM 9.1 8.2*  PROT 7.3  --   BILITOT 3.2*  --   ALKPHOS 86  --   ALT 56  --   AST 73*  --   GLUCOSE 101* 165*    Imaging/Diagnostic Tests: Dg Chest 2 View  Result Date: 08/16/2016 CLINICAL DATA:  Midsternal chest pain and pain between shoulder blades for 12 hours. History of MI, cardiac stents, atrial fibrillation, cirrhosis. Swollen abdomen. EXAM: CHEST  2 VIEW COMPARISON:  Chest x-rays dated 02/17/2016, 02/03/2016 and 01/01/2016. FINDINGS: The heart size and mediastinal contours are within normal limits. Both lungs are clear. The visualized skeletal structures are unremarkable. IMPRESSION: No active cardiopulmonary disease. Electronically Signed   By: Franki Cabot M.D.   On: 08/16/2016 16:19   Ct Abdomen Pelvis W Contrast  Result Date: 08/16/2016 CLINICAL DATA:  Upper left and lower abdominal pain for 3 days. EXAM: CT ABDOMEN AND PELVIS WITH CONTRAST TECHNIQUE: Multidetector CT imaging of the abdomen and pelvis was performed using the standard protocol following bolus administration of intravenous contrast. CONTRAST:  100 mL of Isovue 370 COMPARISON:  June 20, 2016 FINDINGS: Lower chest: A 5.5 mm  nodule in the anterior right lung the on series 205, image 6 is stable. No other abnormalities are seen within the lung bases. Hepatobiliary: A nodular contour to the liver is again identified, consistent with cirrhosis. Low-attenuation near the hepatic dome on image 14 is probably a tiny cyst. No other hepatic masses are seen. The patient is status post cholecystectomy. Thrombus in the portal vein near the confluence with the splenic and superior mesenteric veins on image 38 is nonocclusive and slightly smaller in the interval. This is not an acute finding. Pancreas: Unremarkable. No pancreatic ductal dilatation or surrounding inflammatory changes. Spleen: Splenomegaly is unchanged with no splenic masses. Adrenals/Urinary Tract: Nonobstructive renal stones are again identified with no ureterectasis or ureteral stones. No suspicious renal masses. Stomach/Bowel: Evaluation of the bowel is limited without oral contrast. There is a small duodenal diverticulum of no acute consequence. The stomach and small bowel are otherwise normal. Colonic diverticuli are seen without diverticulitis. The colon and visualized portions of the appendix are otherwise normal. Vascular/Lymphatic: Atherosclerotic changes seen in the abdominal aorta, extending into the iliac vessels. Adenopathy in the mesenteries and retroperitoneum is stable. Reproductive: Uterus and bilateral adnexa are unremarkable. Other: No other abnormalities identified.  Marked ascites. Musculoskeletal: No change in the bones. IMPRESSION: 1. Cirrhosis, splenomegaly, and ascites. 2. Thrombus in the  portal vein at the confluence with the splenic and superior mesenteric veins continues but is smaller in the interval. This is not an acute finding. 3. Adenopathy in the abdomen may be reactive, secondary to cirrhosis. Recommend attention on follow-up. 4. Atherosclerotic change in the aorta and iliac vessels. Electronically Signed   By: Dorise Bullion III M.D   On:  08/16/2016 17:43     Sela Hilding, MD 08/17/2016, 9:14 AM PGY-1, [place] Intern pager: [phone], text pages welcome

## 2016-08-17 NOTE — Transfer of Care (Signed)
Immediate Anesthesia Transfer of Care Note  Patient: Vincent Ochoa  Procedure(s) Performed: Procedure(s): ESOPHAGOGASTRODUODENOSCOPY (EGD) WITH PROPOFOL (N/A)  Patient Location: Endoscopy Unit  Anesthesia Type:MAC  Level of Consciousness: awake, oriented and patient cooperative  Airway & Oxygen Therapy: Patient Spontanous Breathing and Patient connected to nasal cannula oxygen  Post-op Assessment: Report given to RN and Post -op Vital signs reviewed and stable  Post vital signs: Reviewed  Last Vitals:  Vitals:   08/17/16 1019 08/17/16 1101  BP: 110/69 (!) 97/57  Pulse: 63 69  Resp: 13 12  Temp: 36.7 C     Last Pain:  Vitals:   08/17/16 1101  TempSrc: Oral  PainSc:          Complications: No apparent anesthesia complications

## 2016-08-17 NOTE — Consult Note (Signed)
Referring Provider:  ER MD Primary Care Physician:  Ralene Ok, MD Primary Gastroenterologist:  Dr. Michail Sermon  Reason for Consultation:  Vincent Ochoa BLeed  HPI: Vincent Ochoa is a 60 y.o. male with past medical history of the compensated alcoholic cirrhosis, history of esophageal varices requiring band ligation on 06/22/2016( Dr. Watt Climes), history of BRTO for gastric varices admitted to the hospital with hematemesis. Patient was last seen in the clinic in early August. Plan was to repeat EGD for  band ligation ss well as a screening colonoscopy.  Patient seen and examined. According to patient, he started feeling weakness and uneasiness on Friday. This was followed by generalized abdominal discomfort, nausea and few episodes of vomiting. Patient noted very small amount of dried coffee ground stuff in the vomiting yesterday 3 times . Had a bowel movement yesterday which was normal color. No bowel movement today. Denied any bright blood per rectum. Complaining of some chest discomfort but denied any shortness of breath. Denied fever or chills.      Past Medical History:  Diagnosis Date  . Arthritis    "hands, knees, back" (07/05/2016)  . Back pain, lumbosacral 2014  . Coronary artery disease    a. Evaluated by Dr. Stanford Breed 2012 - nonobstructive 2007-2008 by cath. b. Abnormal stress test 10/2014 but cath deferred temporarily due to thrombocytopenia. c. 10/2014: readm with AF-RVR/NSTEMI - s/p BMS to mLCx 11/04/14 (mod dz in RCA);  d. relook cath 01/2015 and 06/02/2015 patent stent, nonobs CAD, EF 55-65%.  . Elevated LFTs   . Facial cellulitis 2014   Periorbital  . Fatty liver US - 2012  . Frequent headaches   . GERD (gastroesophageal reflux disease)   . History of blood transfusion 01/2016   "lost alot of blood vomiting"  . Hyperlipidemia   . Morbid obesity ([place])   . Obesity   . OSA on CPAP    severe with AHI 56/hr with successful CPAP titration to 18cm H2O  . PAF (paroxysmal atrial  fibrillation) ([place])    a. Episode 10/2014 at time of NSTEMI, spont converted to NSR. Observation for further episodes (not on anticoag at present time due to [facility] = 1, thrombocytopenia).  . Psoriasis   . Sinus bradycardia   . Stroke [facility])    "I've been told I'd had a mini stroke on my left side" (07/05/2016)  . Thrombocytopenia ([place])    a. Onset unclear, but noted 2012. ? 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 s/p prednisone, IVIG.    Past Surgical History:  Procedure Laterality Date  . CARDIAC CATHETERIZATION    . CARDIAC CATHETERIZATION N/A 06/02/2015   Procedure: Left Heart Cath and Coronary Angiography;  Surgeon: Jettie Booze, MD;  Location: [facility] LAB;  Service: Cardiovascular;  Laterality: N/A;  . ESOPHAGOGASTRODUODENOSCOPY N/A 06/22/2016   Procedure: ESOPHAGOGASTRODUODENOSCOPY (EGD);  Surgeon: Clarene Essex, MD;  Location: [facility] ENDOSCOPY;  Service: Endoscopy;  Laterality: N/A;  . FACIAL COSMETIC SURGERY  1990s   "rodeo-related injury"  . KNEE ARTHROPLASTY Left 1970's   "put cartilage in it"  . LAPAROSCOPIC CHOLECYSTECTOMY  2007  . LEFT HEART CATHETERIZATION WITH CORONARY ANGIOGRAM N/A 11/04/2014   Procedure: LEFT HEART CATHETERIZATION WITH CORONARY ANGIOGRAM;  Surgeon: Jettie Booze, MD;  Location: [facility] CATH LAB;  Service: Cardiovascular;  Laterality: N/A;  . LEFT HEART CATHETERIZATION WITH CORONARY ANGIOGRAM N/A 01/28/2015   Procedure: LEFT HEART CATHETERIZATION WITH CORONARY ANGIOGRAM;  Surgeon: Sherren Mocha, MD;  Location[facility] CATH LAB;  Service: Cardiovascular;  Laterality: N/A;  . RADIOLOGY WITH ANESTHESIA N/A 02/04/2016   Procedure: RADIOLOGY WITH ANESTHESIA;  Surgeon: Medication Radiologist, MD;  Location: [place];  Service: Radiology;  Laterality: N/A;  . TONSILLECTOMY  1960's    Prior to Admission medications   Medication Sig Start Date End Date Taking? Authorizing Provider  buPROPion (WELLBUTRIN XL) 150 MG 24 hr tablet  Take 1 tablet (150 mg total) by mouth daily. 08/12/16  Yes Sela Hilding, MD  carvedilol (COREG) 3.125 MG tablet Take 1 tablet (3.125 mg total) by mouth 2 (two) times daily with a meal. 08/12/16  Yes Sela Hilding, MD  cyclobenzaprine (FLEXERIL) 10 MG tablet Take 10 mg by mouth 3 (three) times daily as needed for muscle spasms.   Yes Historical Provider, MD  furosemide (LASIX) 80 MG tablet Take 80 mg by mouth 2 (two) times daily.   Yes Historical Provider, MD  isosorbide mononitrate (IMDUR) 30 MG 24 hr tablet Take 30 mg by mouth daily.   Yes Historical Provider, MD  nitroGLYCERIN (NITROSTAT) 0.4 MG SL tablet Place 1 tablet (0.4 mg total) under the tongue every 5 (five) minutes as needed for chest pain (up to 3 doses). 10/31/14  Yes Dayna N Dunn, PA-C  pantoprazole (PROTONIX) 40 MG tablet Take 1 tablet (40 mg total) by mouth 2 (two) times daily. 06/03/16  Yes Sela Hilding, MD  spironolactone (ALDACTONE) 100 MG tablet Take 100 mg by mouth daily.   Yes Historical Provider, MD  sucralfate (CARAFATE) 1 GM/10ML suspension Take 10 mLs (1 g total) by mouth 4 (four) times daily -  with meals and at bedtime. 08/08/16  Yes Verner Mould, MD  atorvastatin (LIPITOR) 40 MG tablet Take 1 tablet (40 mg total) by mouth daily at 8 pm. Patient not taking: Reported on 08/16/2016 06/23/16   Everrett Coombe, MD    Scheduled Meds: . atorvastatin  40 mg Oral q1800  . buPROPion  150 mg Oral Daily  . carvedilol  3.125 mg Oral BID WC  . Chlorhexidine Gluconate Cloth  6 each Topical Q0600  . isosorbide mononitrate  30 mg Oral Daily  . mupirocin ointment  1 application Nasal BID  . [START ON 08/20/2016] pantoprazole  40 mg Intravenous Q12H  . sodium chloride flush  3 mL Intravenous Q12H  . sucralfate  1 g Oral TID WC & HS   Continuous Infusions: . sodium chloride 100 mL/hr at 08/17/16 0655  . octreotide  (SANDOSTATIN)    IV infusion 50 mcg/hr (08/17/16 0348)   PRN Meds:.acetaminophen **OR**  acetaminophen, ondansetron **OR** ondansetron (ZOFRAN) IV  Allergies as of 08/16/2016 - Review Complete 08/16/2016  Allergen Reaction Noted  . Asa [aspirin] Other (See Comments) 08/16/2016  . Eliquis [apixaban] Other (See Comments) 08/16/2016  . Nsaids Other (See Comments) 08/16/2016  . Plavix [clopidogrel] Other (See Comments) 08/16/2016  . Statins Other (See Comments) 08/16/2016    Family History  Problem Relation Age of Onset  . Cirrhosis Mother   . Heart attack Mother   . Hypertension Mother   . Hypertension Father   . Stroke Father   . Arthritis Maternal Grandmother   . Heart disease Sister   . Diabetes Sister   . Cancer - Ovarian Sister   . Heart disease      Maternal/paternal grandparents  . Heart attack Maternal Aunt     Social History   Social History  . Marital status: Married    Spouse name: N/A  . Number of children: N/A  .  Years of education: N/A   Occupational History  . Not on file.   Social History Main Topics  . Smoking status: Former Smoker    Packs/day: 1.50    Years: 43.00    Types: Cigarettes    Quit date: 02/19/2016  . Smokeless tobacco: Former Systems developer  . Alcohol use 0.0 oz/week     Comment: 07/05/2016 "mostly stopped in 1979; might have a beer q 6 months or so"  . Drug use: No  . Sexual activity: Not Currently   Other Topics Concern  . Not on file   Social History Narrative   Lives in [place].    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     Review of Systems: All negative except as stated above in HPI.  Physical Exam: Vital signs: Vitals:   08/17/16 0344 08/17/16 0733  BP: 99/61 110/66  Pulse: 72 72  Resp: 12 12  Temp:  97 F (36.1 C)     General:   Alert,  Well-developed, well-nourished, pleasant and cooperative in NAD Oral mucosa moist. No icterus Lungs:  Clear throughout to auscultation.   No wheezes, crackles, or rhonchi. No acute  distress. Heart:  Regular rate and rhythm; no murmurs, clicks, rubs,  or gallops. Abdomen: Mildly distended, nontender, bowel sounds present LE: no edema  Rectal:  Deferred  GI:  Lab Results:  Recent Labs  08/16/16 1459 08/16/16 2334 08/17/16 0251  WBC 5.9 3.8* 3.5*  HGB 11.1* 10.2* 10.0*  HCT 33.8* 31.2* 30.7*  PLT 59* 47* 46*   BMET  Recent Labs  08/16/16 1449 08/17/16 0251  NA 136 136  K 4.2 4.0  CL 104 108  CO2 24 25  GLUCOSE 101* 165*  BUN 15 15  CREATININE 1.08 1.12  CALCIUM 9.1 8.2*   LFT  Recent Labs  08/16/16 1449  PROT 7.3  ALBUMIN 3.1*  AST 73*  ALT 56  ALKPHOS 86  BILITOT 3.2*   PT/INR  Recent Labs  08/16/16 1459  LABPROT 16.8*  INR 1.35     Studies/Results: Dg Chest 2 View  Result Date: 08/16/2016 CLINICAL DATA:  Midsternal chest pain and pain between shoulder blades for 12 hours. History of MI, cardiac stents, atrial fibrillation, cirrhosis. Swollen abdomen. EXAM: CHEST  2 VIEW COMPARISON:  Chest x-rays dated 02/17/2016, 02/03/2016 and 01/01/2016. FINDINGS: The heart size and mediastinal contours are within normal limits. Both lungs are clear. The visualized skeletal structures are unremarkable. IMPRESSION: No active cardiopulmonary disease. Electronically Signed   By: Franki Cabot M.D.   On: 08/16/2016 16:19   Ct Abdomen Pelvis W Contrast  Result Date: 08/16/2016 CLINICAL DATA:  Upper left and lower abdominal pain for 3 days. EXAM: CT ABDOMEN AND PELVIS WITH CONTRAST TECHNIQUE: Multidetector CT imaging of the abdomen and pelvis was performed using the standard protocol following bolus administration of intravenous contrast. CONTRAST:  100 mL of Isovue 370 COMPARISON:  June 20, 2016 FINDINGS: Lower chest: A 5.5 mm nodule in the anterior right lung the on series 205, image 6 is stable. No other abnormalities are seen within the lung bases. Hepatobiliary: A nodular contour to the liver is again identified, consistent with cirrhosis.  Low-attenuation near the hepatic dome on image 14 is probably a tiny cyst. No other hepatic masses are seen. The patient is status post cholecystectomy. Thrombus in the portal vein near the confluence with the splenic  and superior mesenteric veins on image 38 is nonocclusive and slightly smaller in the interval. This is not an acute finding. Pancreas: Unremarkable. No pancreatic ductal dilatation or surrounding inflammatory changes. Spleen: Splenomegaly is unchanged with no splenic masses. Adrenals/Urinary Tract: Nonobstructive renal stones are again identified with no ureterectasis or ureteral stones. No suspicious renal masses. Stomach/Bowel: Evaluation of the bowel is limited without oral contrast. There is a small duodenal diverticulum of no acute consequence. The stomach and small bowel are otherwise normal. Colonic diverticuli are seen without diverticulitis. The colon and visualized portions of the appendix are otherwise normal. Vascular/Lymphatic: Atherosclerotic changes seen in the abdominal aorta, extending into the iliac vessels. Adenopathy in the mesenteries and retroperitoneum is stable. Reproductive: Uterus and bilateral adnexa are unremarkable. Other: No other abnormalities identified.  Marked ascites. Musculoskeletal: No change in the bones. IMPRESSION: 1. Cirrhosis, splenomegaly, and ascites. 2. Thrombus in the portal vein at the confluence with the splenic and superior mesenteric veins continues but is smaller in the interval. This is not an acute finding. 3. Adenopathy in the abdomen may be reactive, secondary to cirrhosis. Recommend attention on follow-up. 4. Atherosclerotic change in the aorta and iliac vessels. Electronically Signed   By: Dorise Bullion III M.D   On: 08/16/2016 17:43    Impression/Plan: Coffee-ground emesis in setting of history of esophageal varices and gastric varices. Need to rule out variceal bleeding. History of gastric varices status post BX RTO History of  esophageal varices. Status post band ligation 06/22/2016 Decompensated alcoholic cirrhosis Portal vein thrombus. Chronic Ascites  Recommendations ------------------------ - Recommend EGD for further evaluation. Risk benefits alternatives discussed with the patient. Verbalized understanding - Transfuse 1 unit of platelets - Continue octreotide, Protonix for now. - We'll add Rocephin - GI will follow   LOS: 1 day   Otis Brace  MD, FACP 08/17/2016, 8:14 AM  Pager [phone] If no answer or after 5 PM call [phone]

## 2016-08-17 NOTE — Op Note (Signed)
[facility] Patient Name: Vincent Ochoa Procedure Date : 08/17/2016 MRN: [id number] Attending MD: Otis Brace , MD Date of Birth: 1956/10/02 CSN: [id number] Age: 60 Admit Type: Inpatient Procedure:                Upper GI endoscopy Indications:              Coffee-ground emesis Providers:                Otis Brace, MD, Zenon Mayo, RN, William Dalton, Technician Referring MD:              Medicines:                See the Anesthesia note for documentation of the                            administered medications Complications:            No immediate complications. Estimated Blood Loss:     Estimated blood loss: none. Procedure:                Pre-Anesthesia Assessment:                           - Prior to the procedure, a History and Physical                            was performed, and patient medications and                            allergies were reviewed. The patient's tolerance of                            previous anesthesia was also reviewed. The risks                            and benefits of the procedure and the sedation                            options and risks were discussed with the patient.                            All questions were answered, and informed consent                            was obtained. Prior Anticoagulants: The patient has                            taken no previous anticoagulant or antiplatelet                            agents. ASA Grade Assessment: IV - A patient with  severe systemic disease that is a constant threat                            to life. After reviewing the risks and benefits,                            the patient was deemed in satisfactory condition to                            undergo the procedure.                           After obtaining informed consent, the endoscope was                            passed under direct vision.  Throughout the                            procedure, the patient's blood pressure, pulse, and                            oxygen saturations were monitored continuously. The                            [id number]) scope was introduced through the                            mouth, and advanced to the second part of duodenum.                            The upper GI endoscopy was accomplished with ease.                            The patient tolerated the procedure well. Findings:      Large (> 5 mm) varices were found in the lower third of the esophagus       without any stigmata for recent bleeding. Two bands were successfully       placed [Treatment]. [Bleeding present].      A small amount of food (residue) was found in the gastric body. no       evidence of active bleeding      Scattered moderate inflammation characterized by congestion (edema),       friability and granularity was found in the prepyloric region of the       stomach. Biopsies were taken with a cold forceps for Helicobacter pylori       testing.      The duodenal bulb, first portion of the duodenum and second portion of       the duodenum were normal. Impression:               - Large (> 5 mm) esophageal varices without any                            stigmata of recent bleeding. Banded.                           -  A small amount of food (residue) in the stomach.                           - Gastritis. Biopsied.                           - Normal duodenal bulb, first portion of the                            duodenum and second portion of the duodenum. Moderate Sedation:      Moderate (conscious) sedation was personally administered by an       anesthesia professional. The following parameters were monitored: oxygen       saturation, heart rate, blood pressure, and response to care. Recommendation:           - Await pathology results.                           - Return patient to hospital ward for ongoing care.                            - Full liquid diet.                           - Continue present medications.                           - Repeat upper endoscopy in 6 weeks for retreatment.                           - Return to GI clinic in 5 weeks. Procedure Code(s):        --- Professional ---                           [phone], Esophagogastroduodenoscopy, flexible,                            transoral; with band ligation of esophageal/gastric                            varices                           43239, Esophagogastroduodenoscopy, flexible,                            transoral; with biopsy, single or multiple Diagnosis Code(s):        --- Professional ---                           I85.00, Esophageal varices without bleeding                           K29.70, Gastritis, unspecified, without bleeding                           K92.0, Hematemesis CPT copyright 2016 American  Medical Association. All rights reserved. The codes documented in this report are preliminary and upon coder review may  be revised to meet current compliance requirements. Otis Brace, MD Otis Brace, MD 08/17/2016 12:08:21 PM Number of Addenda: 0

## 2016-08-17 NOTE — Care Management Note (Signed)
Case Management Note  Patient Details  Name: Vincent Ochoa MRN: [id number] Date of Birth: [birth year]  Subjective/Objective:    Pt presented for Hematemesis and Chest Pressure.  Pt with Hgb stable at 11.1 with mild hypotension. Pt is without insurance, however has PCP @ the [facility].           Action/Plan: Development worker, community is working with the patient. CM will continue to monitor for additional disposition needs.   Expected Discharge Date:                  Expected Discharge Plan:  Home/Self Care  In-House Referral:  Financial Counselor  Discharge planning Services  CM Consult  Post Acute Care Choice:    Choice offered to:     DME Arranged:    DME Agency:     HH Arranged:    HH Agency:     Status of Service:  In process, will continue to follow  If discussed at Long Length of Stay Meetings, dates discussed:    Additional Comments:  Bethena Roys, RN 08/17/2016, 12:31 PM

## 2016-08-18 ENCOUNTER — Inpatient Hospital Stay (HOSPITAL_COMMUNITY): Payer: Medicaid Other

## 2016-08-18 ENCOUNTER — Other Ambulatory Visit (HOSPITAL_COMMUNITY): Payer: Self-pay

## 2016-08-18 ENCOUNTER — Encounter (HOSPITAL_COMMUNITY): Payer: Self-pay | Admitting: Gastroenterology

## 2016-08-18 ENCOUNTER — Other Ambulatory Visit: Payer: Self-pay

## 2016-08-18 LAB — COMPREHENSIVE METABOLIC PANEL
ALBUMIN: 2.5 g/dL — AB (ref 3.5–5.0)
ALT: 48 U/L (ref 17–63)
ANION GAP: 4 — AB (ref 5–15)
AST: 68 U/L — ABNORMAL HIGH (ref 15–41)
Alkaline Phosphatase: 72 U/L (ref 38–126)
BILIRUBIN TOTAL: 2.3 mg/dL — AB (ref 0.3–1.2)
BUN: 10 mg/dL (ref 6–20)
CO2: 25 mmol/L (ref 22–32)
Calcium: 8.1 mg/dL — ABNORMAL LOW (ref 8.9–10.3)
Chloride: 107 mmol/L (ref 101–111)
Creatinine, Ser: 0.92 mg/dL (ref 0.61–1.24)
GFR calc Af Amer: >60 mL/min (ref >60)
GFR calc non Af Amer: >60 mL/min (ref >60)
GLUCOSE: 136 mg/dL — AB (ref 65–99)
POTASSIUM: 4.4 mmol/L (ref 3.5–5.1)
SODIUM: 136 mmol/L (ref 135–145)
TOTAL PROTEIN: 6.1 g/dL — AB (ref 6.5–8.1)

## 2016-08-18 LAB — PREPARE PLATELET PHERESIS: Unit division: 0

## 2016-08-18 LAB — GRAM STAIN

## 2016-08-18 LAB — CBC
HEMATOCRIT: 30.6 % — AB (ref 39.0–52.0)
Hemoglobin: 10 g/dL — ABNORMAL LOW (ref 13.0–17.0)
MCH: 30.4 pg (ref 26.0–34.0)
MCHC: 32.7 g/dL (ref 30.0–36.0)
MCV: 93 fL (ref 78.0–100.0)
Platelets: 50 10*3/uL — ABNORMAL LOW (ref 150–400)
RBC: 3.29 MIL/uL — ABNORMAL LOW (ref 4.22–5.81)
RDW: 16 % — AB (ref 11.5–15.5)
WBC: 3.1 10*3/uL — AB (ref 4.0–10.5)

## 2016-08-18 LAB — BODY FLUID CELL COUNT WITH DIFFERENTIAL
EOS FL: 0 %
Lymphs, Fluid: 26 %
Monocyte-Macrophage-Serous Fluid: 66 % (ref 50–90)
Neutrophil Count, Fluid: 7 % (ref 0–25)
WBC FLUID: 148 uL (ref 0–1000)

## 2016-08-18 LAB — LACTATE DEHYDROGENASE, PLEURAL OR PERITONEAL FLUID: LD FL: 35 U/L — AB (ref 3–23)

## 2016-08-18 LAB — ALBUMIN: Albumin: 2.5 g/dL — ABNORMAL LOW (ref 3.5–5.0)

## 2016-08-18 MED ORDER — DIPHENHYDRAMINE HCL 50 MG/ML IJ SOLN
25.0000 mg | Freq: Once | INTRAMUSCULAR | Status: AC
  Administered 2016-08-18: 25 mg via INTRAVENOUS
  Filled 2016-08-18: qty 1

## 2016-08-18 MED ORDER — LACTULOSE 10 GM/15ML PO SOLN
10.0000 g | Freq: Three times a day (TID) | ORAL | Status: DC
  Filled 2016-08-18 (×2): qty 15

## 2016-08-18 MED ORDER — GI COCKTAIL ~~LOC~~
30.0000 mL | Freq: Two times a day (BID) | ORAL | Status: DC | PRN
  Administered 2016-08-18: 30 mL via ORAL
  Filled 2016-08-18: qty 30

## 2016-08-18 MED ORDER — LIDOCAINE HCL (PF) 1 % IJ SOLN
INTRAMUSCULAR | Status: AC
  Filled 2016-08-18: qty 30

## 2016-08-18 NOTE — Progress Notes (Signed)
Patient complains of chest pain. 2L [place] already on patient. EKG completed, showed NSR. Patient said symptoms went away. Will consult MD regarding GI cocktail order.

## 2016-08-18 NOTE — Procedures (Signed)
Successful US guided paracentesis from RLQW.  Yielded 2 L of clear yellow fluid.  No immediate complications.  Pt tolerated well.   Specimen was sent for labs.  Ascencion Dike PA-C 08/18/2016 1:36 PM

## 2016-08-18 NOTE — Progress Notes (Signed)
Interval note  Paged by RN about significant pruritis. He has some erythema and welts around his mouth and on his face. She does note he's be scratching at lot per her report.  Per patient, he was sleeping comfortably when he woke up due to intense pruritus. He notes some small welts on his face and abdomen. No respiratory compromise. No numbness/tingling of the throat. No throat swelling.   On exam, he has excoriations over his face and arms. There maybe some welt formation as well.  He has 1 excoriation on his abdomen. No oropharyngeal swelling. Lungs clear. No stridor. No increased WOB.   The patient's total bilirubin was 3.2.   On review of EMR and MAR, the patient does not appear to have any new medications. He did receive a throat spray approximately 12 hours ago, I am not convinced this was the culprit. The most recent medications that he received are octreotide (infusion) and carafate, however from looking at previous records he's received both of this in the past without issue.   IV benadryl 25mg  now  Will get bile acid level in the AM (as I am not convinced this is an allergic reaction) RN to continue to monitor and report if symptoms worsen.  Archie Patten, MD [facility] Family Medicine Resident  08/18/2016, 12:57 AM

## 2016-08-18 NOTE — Evaluation (Addendum)
Physical Therapy Evaluation Patient Details Name: Vincent Ochoa MRN: [id number] DOB: 06/05/1956 Today's Date: 08/18/2016   History of Present Illness   60 y.o. male presenting with 1 day of hematemesis and chest pressure. s/p EGD 9/27 (esophageal varices) and planned paracentesis 9/28 PMH is significant for alcoholic cirrhosis, CAD, depression, HFpEF, HLD, OSA.    Clinical Impression  Pt admitted with above diagnosis. Patient reports decline in mobility over past two months. Reports in last several weeks he has fallen "multiple" times. He feels "off balance" and described "one time I was walking and the next thing I was on the ground...like the ground moved." On exam, pt with decreased coordination of left leg (heel to shin and rapid alternating movements). Balance testing with his eyes closed indicative of a vestibular deficit. He reports he has dropped several items when holding with left hand. Also described intermittent episodes (usually ~10 seconds) of Left sided numbness (arm and leg).   NOTE: discussed above with Chat, RN and paged resident pager x2 (text page and via phone) to discuss above with no response x 30 minutes at this phone.  Pt currently with functional limitations due to the deficits listed below (see PT Problem List). Pt will benefit from skilled PT to increase their independence and safety with mobility to allow discharge to the venue listed below.       Follow Up Recommendations Outpatient PT;Supervision for mobility/OOB    Equipment Recommendations  Rolling walker with 5" wheels    Recommendations for Other Services OT consult     Precautions / Restrictions Precautions Precautions: Fall Precaution Comments: reports "multiple" falls in past few weeks; "imbalance"      Mobility  Bed Mobility Overal bed mobility: Modified Independent             General bed mobility comments: incr effort  Transfers Overall transfer level: Needs assistance Equipment  used: Rolling walker (2 wheeled) Transfers: Sit to/from Stand Sit to Stand: Min guard         General transfer comment: vc for technique (new to using RW)  Ambulation/Gait Ambulation/Gait assistance: Min assist Ambulation Distance (Feet): 50 Feet (seated rest, 25) Assistive device: Rolling walker (2 wheeled) Gait Pattern/deviations: Step-through pattern;Decreased stride length;Trunk flexed Gait velocity: decr   General Gait Details: with RW did well maintianing proximity to RW (after initial cues); no difficulty turning or going over threshold to the bathroom  Stairs            Wheelchair Mobility    Modified Rankin (Stroke Patients Only)       Balance Overall balance assessment: Needs assistance;History of Falls Sitting-balance support: No upper extremity supported;Feet supported Sitting balance-Leahy Scale: Good     Standing balance support: No upper extremity supported Standing balance-Leahy Scale: Fair           Rhomberg - Eyes Opened: 30 [place] - Eyes Closed: 10 High level balance activites: Backward walking;Direction changes;Sudden stops High Level Balance Comments: rhomberg eyes closed pt with significant incr sway to rt and posterior, he then overshoots midline as trying to regain balance (loses balance to his left); repeated x 3             Pertinent Vitals/Pain All VSS on monitor  Pain Assessment: No/denies pain    Home Living Family/patient expects to be discharged to:: Private residence Living Arrangements: Spouse/significant other;Other relatives (brother in law) Available Help at Discharge: Family;Available 24 hours/day Type of Home: House Home Access: Stairs to enter Entrance Stairs-Rails: Can reach  both;Left;Right Entrance Stairs-Number of Steps: 3 Home Layout: Two level;Able to live on main level with bedroom/bathroom Home Equipment: Shower seat      Prior Function Level of Independence: Independent         Comments:  several recent falls; losing balance     Hand Dominance   Dominant Hand: Right    Extremity/Trunk Assessment   Upper Extremity Assessment: Overall WFL for tasks assessed (including coordination (FNF, RAM))           Lower Extremity Assessment: LLE deficits/detail   LLE Deficits / Details: decreased coordination with heel to shin and RAM (bil toe tapping); reports episodes of numbness (Lt arm and leg) ~10 seconds  Cervical / Trunk Assessment: Other exceptions  Communication   Communication: No difficulties  Cognition Arousal/Alertness: Awake/alert Behavior During Therapy: WFL for tasks assessed/performed Overall Cognitive Status: No family/caregiver present to determine baseline cognitive functioning (difficulty giving specifics re: falls (how many, how))                      General Comments      Exercises     Assessment/Plan    PT Assessment Patient needs continued PT services  PT Problem List Decreased balance;Decreased mobility;Decreased coordination;Decreased knowledge of use of DME;Impaired sensation;Obesity          PT Treatment Interventions DME instruction;Gait training;Stair training;Functional mobility training;Therapeutic activities;Balance training;Neuromuscular re-education;Patient/family education    PT Goals (Current goals can be found in the Care Plan section)  Acute Rehab PT Goals Patient Stated Goal: find out what is wrong with me PT Goal Formulation: With patient Time For Goal Achievement: 08/25/16 Potential to Achieve Goals: Good    Frequency Min 3X/week   Barriers to discharge        Co-evaluation               End of Session Equipment Utilized During Treatment: Gait belt   Patient left: in bed;with call bell/phone within reach Nurse Communication: Mobility status;Other (comment) (need for RW; TIA-like symptoms pt described)         Time: [id number] PT Time Calculation (min) (ACUTE ONLY): 42 min   Charges:   PT  Evaluation $PT Eval Moderate Complexity: 1 Procedure PT Treatments $Gait Training: 23-37 mins   PT G Codes:        Drishti Pepperman 2016/08/19, 12:10 PM  Pager [phone]

## 2016-08-18 NOTE — Progress Notes (Signed)
Family Medicine Teaching Service Daily Progress Note Intern Pager: [phone]  Patient name: Vincent Ochoa Medical record number: [id number] Date of birth: 09-22-1956 Age: 60 y.o. Gender: male  Primary Care Provider: Ralene Ok, MD Consultants: Gastroenterology Code Status: FULL  Pt Overview and Major Events to Date:  9/26:  Seen in [facility] c/w chest pressure and hematemesis, transported via EMS, ED eval without acute EKG changes, Hgb stable 9/27:  GI consulted, EGD showed large non-bleeding esophogeal varices with gastritis 9/28:  Patient to get paracentesis  Assessment and Plan: Vincent Ochoa a 60 y.o.malepresenting with 1 day of hematemesis and chest pressure. PMH is significant for alcoholic cirrhosis, CAD, depression, HFpEF, HLD, OSA.  #Hematemesis:Hx variceal bleeds 7 months ago requiring obliteration of varices (Balloon-Occluded Retrograde Transvenous Obliteration of Gastric Varices). Patient admitted for hematemesis July 04 2016. GI was consulted and recommended PPI therapy indefinitely and sucralfate suspension for 6 weeks. Most recent EGD on record was performed on 8/2 and showed distal esophageal varies without signs of active bleeding post banding. GI had planned for outpatient EGD one month after this with additional banding if needed. Patient is unsure if this EGD was performed.EGD was performed on 9/27 showing large non-bleeding esophogeal varices with gastritis. Vital signs stable. Type and screen already performed (A-). Lipase WNL at 34. FOBT+. Hgb 11.1 >10.2>10.0. - Continue octreotide infusion  - Protonix 40 mg PO  - Continue home carafate  - GI consulted, appreciate recommendation, on prophylactic rocephin, given x1 transfuse platelets as of 9/27, patient will need repeat EGD in 6 weeks and outpatient f/u in 5 weeks, scheduled for outpatient colonoscopy, awaiting further recommednations - Zofran PRN  #Chest Pressure: Risk factors for ACS: CAD and Obesity. iStat  troponin negative and EKG without acute ST segment changes. Pain was originally radiating to back but is now much improved so doubt aortic dissection. Trop 0.00> 0.03 > <0.03. - EKG PRN chest pain  #Alcoholic cirrhosis with ascites:Heavy EtOH use in 1990s. Hx variceal bleeds s/p BRTO in 01/2016, variceal banding 06/2016. Followed by Dr. Michail Sermon.CT abdomen with marked ascites, fluid wave present on exam, would like to give paracentesis. Child Pugh C score 10 (life expectancy 1-3 years, abdominal surgery peri op mortality 82%). EGD showed large non-bleeding esophogeal varices with gastritis. Varices banded. - Protonix 40 mg PO - GI to give paracentesis 9/28 - Holding spironolactone and lasix given low BPs during admission, stable now, will consider restarting after GI recs  #CAD: Patient with bare metal stent placement in 2015. Last cath in July 2016 Patient is not on anti-platelet therapy given history of recurrent GI bleeds.  - Continue home Lipitor   #HFrEF:  Last EF 50% (01/2015). Imdur, Lasix and Spironolactone were discontinued at recent hospitalization due to hypotension. However, patient believes he may still be taking some of these medications and reports taking Lasix 80 mg this morning. Adequate UO -2.675 L yesterday. - Continue Coreg and Imdur  - Holding Lasix and Spironolactone for now, see above  #Chronic Portal Vein Thrombosis: Found on CT imaging during admission in march 2017.  Seen on CT abdomen of this admission but smaller. GI was consulted at that time and recommendedthat the risks of anticoagulation outweighed the benefits given his significant bleed history.   #Depression - Continue home bupropion  #OSA - CPAP QHS  #Pruritis:  New onset evening of 9/28. No obvious cause as IV meds have been administrated for several days. Given IV benadryl with resolution of symptoms. None today. - F/u  bile salt  FEN/GI: heart healthy diet after paracentesis, PO PPI and IV  octreotide PPx: SCDs given GI bleed  Disposition: continued management in stepdown and await GI recommendations following paracentesis  Subjective:  Afebrile with stable and improved BP. Some c/o SOB, due for paracentesis. No nausea or CP. Patient endorses some unchanged mild tenderness on belly but not obvious for SBP.   Objective: Temp:  [97.5 F (36.4 C)-98.4 F (36.9 C)] 97.9 F (36.6 C) (09/28 0700) Pulse Rate:  [63-75] 64 (09/28 0752) Resp:  [8-18] 12 (09/28 0752) BP: (91-126)/(50-69) 111/65 (09/28 0752) SpO2:  [92 %-98 %] 97 % (09/28 0752) Physical Exam: General: Overweight male lying in bed in NAD Cardiovascular: RRR. No murmurs appreciated.  Respiratory: CTAB. Normal WOB.  Gastrointestinal: +BS, moderate distention + fluid wave, mild hepatomegaly, diffusely TTP without rebound or guarding  MSK: Full ROM in all 4 extremities.  Neuro: Alert and oriented. No gross deficits.  Psych: Appropriate mood and affect.  Laboratory:  Recent Labs Lab 08/16/16 2334 08/17/16 0251 08/18/16 0315  WBC 3.8* 3.5* 3.1*  HGB 10.2* 10.0* 10.0*  HCT 31.2* 30.7* 30.6*  PLT 47* 46* 50*    Recent Labs Lab 08/16/16 1449 08/17/16 0251 08/18/16 0315  NA 136 136 136  K 4.2 4.0 4.4  CL 104 108 107  CO2 24 25 25   BUN 15 15 10   CREATININE 1.08 1.12 0.92  CALCIUM 9.1 8.2* 8.1*  PROT 7.3  --  6.1*  BILITOT 3.2*  --  2.3*  ALKPHOS 86  --  72  ALT 56  --  48  AST 73*  --  68*  GLUCOSE 101* 165* 136*    Imaging/Diagnostic Tests: No results found.  Dg Chest 2 View Result Date: 08/16/2016 CLINICAL DATA:  Midsternal chest pain and pain between shoulder blades for 12 hours. History of MI, cardiac stents, atrial fibrillation, cirrhosis. Swollen abdomen. EXAM: CHEST  2 VIEW COMPARISON:  Chest x-rays dated 02/17/2016, 02/03/2016 and 01/01/2016. FINDINGS: The heart size and mediastinal contours are within normal limits. Both lungs are clear. The visualized skeletal structures are unremarkable.  IMPRESSION: No active cardiopulmonary disease. Electronically Signed   By: Franki Cabot M.D.   On: 08/16/2016 16:19   Ct Abdomen Pelvis W Contrast Result Date: 08/16/2016 CLINICAL DATA:  Upper left and lower abdominal pain for 3 days. EXAM: CT ABDOMEN AND PELVIS WITH CONTRAST TECHNIQUE: Multidetector CT imaging of the abdomen and pelvis was performed using the standard protocol following bolus administration of intravenous contrast. CONTRAST:  100 mL of Isovue 370 COMPARISON:  June 20, 2016 FINDINGS: Lower chest: A 5.5 mm nodule in the anterior right lung the on series 205, image 6 is stable. No other abnormalities are seen within the lung bases. Hepatobiliary: A nodular contour to the liver is again identified, consistent with cirrhosis. Low-attenuation near the hepatic dome on image 14 is probably a tiny cyst. No other hepatic masses are seen. The patient is status post cholecystectomy. Thrombus in the portal vein near the confluence with the splenic and superior mesenteric veins on image 38 is nonocclusive and slightly smaller in the interval. This is not an acute finding. Pancreas: Unremarkable. No pancreatic ductal dilatation or surrounding inflammatory changes. Spleen: Splenomegaly is unchanged with no splenic masses. Adrenals/Urinary Tract: Nonobstructive renal stones are again identified with no ureterectasis or ureteral stones. No suspicious renal masses. Stomach/Bowel: Evaluation of the bowel is limited without oral contrast. There is a small duodenal diverticulum of no acute consequence. The stomach  and small bowel are otherwise normal. Colonic diverticuli are seen without diverticulitis. The colon and visualized portions of the appendix are otherwise normal. Vascular/Lymphatic: Atherosclerotic changes seen in the abdominal aorta, extending into the iliac vessels. Adenopathy in the mesenteries and retroperitoneum is stable. Reproductive: Uterus and bilateral adnexa are unremarkable. Other: No other  abnormalities identified.  Marked ascites. Musculoskeletal: No change in the bones. IMPRESSION: 1. Cirrhosis, splenomegaly, and ascites. 2. Thrombus in the portal vein at the confluence with the splenic and superior mesenteric veins continues but is smaller in the interval. This is not an acute finding. 3. Adenopathy in the abdomen may be reactive, secondary to cirrhosis. Recommend attention on follow-up. 4. Atherosclerotic change in the aorta and iliac vessels. Electronically Signed   By: Dorise Bullion III M.D   On: 08/16/2016 17:43     [place] Bing, DO 08/18/2016, 9:48 AM PGY-1, [place] Intern pager: [phone], text pages welcome

## 2016-08-18 NOTE — Progress Notes (Signed)
Called for patient having chest pain that resolved prior to nurse arriving (very short lived). EKG NSR without concerns for ischemia. CP was non radiating, patient thinks it could be heartburn. Will give GI cocktail and get trop out of abundance of caution.

## 2016-08-18 NOTE — Progress Notes (Signed)
dressing to right lower quadrant, post paracentesis site dry and intact. Denied any pain.

## 2016-08-18 NOTE — Progress Notes (Signed)
[facility] Gastroenterology Progress Note  Vincent Ochoa 60 y.o. Jan 16, 1956  CC:  GI bleed   Subjective: Patient complaining of weakness and fatigue. Denied nausea vomiting. Had a bowel movement which was normal color and nonbloody.  ROS : Denied abdominal pain, complaining of abdominal distention. Denied nausea or vomiting   Objective: Vital signs in last 24 hours: Vitals:   08/18/16 0700 08/18/16 0752  BP:  111/65  Pulse:  64  Resp:  12  Temp: 97.9 F (36.6 C)     Physical Exam: General:   Alert,  Well-developed, well-nourished, pleasant and cooperative in NAD Oral mucosa moist. No icterus Lungs:  Clear throughout to auscultation.   No wheezes, crackles, or rhonchi. No acute distress. Heart:  Regular rate and rhythm; no murmurs, clicks, rubs,  or gallops. Abdomen: Mildly distended, nontender, bowel sounds present LE: no edema     Lab Results:  Recent Labs  08/17/16 0251 08/18/16 0315  NA 136 136  K 4.0 4.4  CL 108 107  CO2 25 25  GLUCOSE 165* 136*  BUN 15 10  CREATININE 1.12 0.92  CALCIUM 8.2* 8.1*    Recent Labs  08/16/16 1449 08/18/16 0315  AST 73* 68*  ALT 56 48  ALKPHOS 86 72  BILITOT 3.2* 2.3*  PROT 7.3 6.1*  ALBUMIN 3.1* 2.5*    Recent Labs  08/17/16 0251 08/18/16 0315  WBC 3.5* 3.1*  HGB 10.0* 10.0*  HCT 30.7* 30.6*  MCV 93.3 93.0  PLT 46* 50*    Recent Labs  08/16/16 1459  LABPROT 16.8*  INR 1.35      Assessment/Plan: Coffee-ground emesis in setting of history of esophageal varices and gastric varices.S/P EGD with EVL . History of gastric varices status post BX RTO History of esophageal varices. Status post band ligation 06/22/2016 Decompensated alcoholic cirrhosis Portal vein thrombus. Chronic Ascites  Recommendations ------------------------ - Patient status post EGD with 2 bands placement yesterday. - Advance diet - Continue octreotide for today - Continue Rocephin while in the hospital - Lactulose titrated to 2-3  soft bowel movements per day - Patient was scheduled for outpatient colonoscopy. Recommend follow-up in GI clinic after discharge - GI will follow   Otis Brace MD, FACP 08/18/2016, 8:50 AM  Pager [phone]  If no answer or after 5 PM call [phone]

## 2016-08-19 LAB — TROPONIN I

## 2016-08-19 LAB — CBC
HCT: 31 % — ABNORMAL LOW (ref 39.0–52.0)
Hemoglobin: 10.1 g/dL — ABNORMAL LOW (ref 13.0–17.0)
MCH: 30.3 pg (ref 26.0–34.0)
MCHC: 32.6 g/dL (ref 30.0–36.0)
MCV: 93.1 fL (ref 78.0–100.0)
PLATELETS: 52 10*3/uL — AB (ref 150–400)
RBC: 3.33 MIL/uL — ABNORMAL LOW (ref 4.22–5.81)
RDW: 16.1 % — AB (ref 11.5–15.5)
WBC: 2.8 10*3/uL — ABNORMAL LOW (ref 4.0–10.5)

## 2016-08-19 LAB — PATHOLOGIST SMEAR REVIEW

## 2016-08-19 LAB — BILE ACIDS, TOTAL: BILE ACIDS TOTAL: 37.1 umol/L — AB (ref 3.8–20.9)

## 2016-08-19 MED ORDER — LACTULOSE 10 GM/15ML PO SOLN: 10.0000 g | Freq: Three times a day (TID) | ORAL | 0 refills | Status: DC

## 2016-08-19 MED ORDER — SUCRALFATE 1 G PO TABS: 1.0000 g | ORAL_TABLET | Freq: Three times a day (TID) | ORAL | 2 refills | Status: DC

## 2016-08-19 NOTE — Discharge Summary (Signed)
[facility] Discharge Summary  Patient name: Vincent Ochoa Medical record number: [id number] Date of birth: [birth year] Age: 60 y.o. Gender: male Date of Admission: 08/16/2016  Date of Discharge: 08/19/16 Admitting Physician: Zenia Resides, MD  Primary Care Provider: Ralene Ok, MD Consultants: GI  Indication for Hospitalization: chest pain, hematemesis  Discharge Diagnoses/Problem List:  Alcoholic cirrhosis with ascites Hematemesis, resolved CAD HFrEF Chronic portal vein thrombosis Depression OSA  Disposition: home  Discharge Condition: stable  Discharge Exam:  General: Overweight male lying in bed in NAD Cardiovascular: RRR. No murmurs appreciated.  Respiratory: CTAB. Normal WOB.  Gastrointestinal: +BS, mild distention mild hepatomegaly MSK: Full ROM in all 4 extremities.  Neuro: Alert and oriented. No gross deficits.  Psych: Appropriate mood and affect.   Brief Hospital Course:  Patient presented to routine follow up with PCP, but complained of chest pain and hematemesis the evening prior. Had called EMS to his house the night prior, EKG without concerns for STEMI, paramedics recommended transport, but patient refused. On arrival to [facility], patient with persistent CP. Given 325mg  ASA, EKG performed with stable q waves in septal leads, EMS transported to [facility]. On admission, trop negative, EKG without acute changes, CP much improved. Trops 0.03, <0.03, <0.03. Hematemesis never occurred on admission, GI consulted. Octreotide drip given, rocephin given for prophylaxis surrounding EGD and varices. EGD performed 9/27, gastritis on biopsy, varices without signs of acute bleeding, banded. Diffusely tender on abdominal exam, but no vital sign changes consistent with SBP. Paracentesis performed for 2L clear fluid. Patient advanced to full diet, tolerated well.   Issues for Follow Up:  1. Please go over home meds. Lasix and spiro stopped on last admission,  but patient reported taking lasix on morning of admission. Can consider restarting one or both depending on how his ascites looks. 2. Ensure use of carafate tablets dissolved in water 2/2 cost.  3. Monitor reaccumulation of ascites. 4. Patient reports history of being tired all the time - explained that this is likely due to all his chronic conditions, ascites, hypotension. Could consider B12 level. 5. Ensure GI followup.  Significant Procedures: EGD 9/27, US paracentesis 9/27  Significant Labs and Imaging:   Recent Labs Lab 08/17/16 0251 08/18/16 0315 08/19/16 0556  WBC 3.5* 3.1* 2.8*  HGB 10.0* 10.0* 10.1*  HCT 30.7* 30.6* 31.0*  PLT 46* 50* 52*    Recent Labs Lab 08/16/16 1449 08/17/16 0251 08/18/16 0315 08/18/16 1518  NA 136 136 136  --   K 4.2 4.0 4.4  --   CL 104 108 107  --   CO2 24 25 25   --   GLUCOSE 101* 165* 136*  --   BUN 15 15 10   --   CREATININE 1.08 1.12 0.92  --   CALCIUM 9.1 8.2* 8.1*  --   ALKPHOS 86  --  72  --   AST 73*  --  68*  --   ALT 56  --  48  --   ALBUMIN 3.1*  --  2.5* 2.5*     Results/Tests Pending at Time of Discharge: none  Discharge Medications:    Medication List    STOP taking these medications   cyclobenzaprine 10 MG tablet Commonly known as:  FLEXERIL   furosemide 80 MG tablet Commonly known as:  LASIX   spironolactone 100 MG tablet Commonly known as:  ALDACTONE   sucralfate 1 GM/10ML suspension Commonly known as:  CARAFATE Replaced by:  sucralfate 1 g  tablet     TAKE these medications   atorvastatin 40 MG tablet Commonly known as:  LIPITOR Take 1 tablet (40 mg total) by mouth daily at 8 pm.   buPROPion 150 MG 24 hr tablet Commonly known as:  WELLBUTRIN XL Take 1 tablet (150 mg total) by mouth daily.   carvedilol 3.125 MG tablet Commonly known as:  COREG Take 1 tablet (3.125 mg total) by mouth 2 (two) times daily with a meal.   isosorbide mononitrate 30 MG 24 hr tablet Commonly known as:  IMDUR Take 30  mg by mouth daily.   lactulose 10 GM/15ML solution Commonly known as:  CHRONULAC Take 15 mLs (10 g total) by mouth 3 (three) times daily.   nitroGLYCERIN 0.4 MG SL tablet Commonly known as:  NITROSTAT Place 1 tablet (0.4 mg total) under the tongue every 5 (five) minutes as needed for chest pain (up to 3 doses).   pantoprazole 40 MG tablet Commonly known as:  PROTONIX Take 1 tablet (40 mg total) by mouth 2 (two) times daily.   sucralfate 1 g tablet Commonly known as:  CARAFATE Take 1 tablet (1 g total) by mouth 4 (four) times daily -  with meals and at bedtime. Dissolve in water. Replaces:  sucralfate 1 GM/10ML suspension       Discharge Instructions: Please refer to Patient Instructions section of EMR for full details.  Patient was counseled important signs and symptoms that should prompt return to medical care, changes in medications, dietary instructions, activity restrictions, and follow up appointments.   Follow-Up Appointments: Follow-up Information    Eloise Levels, MD. Go on 08/24/2016.   Why:  hospital follow up with Dr. Kathyrn Drown, arrive by 1:45pm Contact information: [place] [address] [phone]           Sela Hilding, MD 08/21/2016, 7:48 AM PGY-1, [place]

## 2016-08-19 NOTE — Progress Notes (Signed)
Pt discharge paperwork given and all questions answered. Belongings with patient.

## 2016-08-19 NOTE — Discharge Instructions (Signed)
Your biopsy results just showed an irritated stomach, which is to be expected with your reflux. Follow up with [facility] as scheduled and for a repeat EGD in 6 weeks. I have sent your carafate in as a pill, just dissolve this in water to save money.   I don't have clinic for a few weeks, so I am scheduling you with Dr. Rosalyn Gess on 10/4 at 2pm, you will like him! I want you to NOT take your spironolactone and your lasix at home until then. He will tell you whether to restart them.   Call us or come to the ED if you have more vomiting blood or coffee looking vomit or more chest pain. I sent in the PT referral, someone should call you to schedule it!

## 2016-08-19 NOTE — Progress Notes (Signed)
Physical Therapy Treatment Patient Details Name: Vincent Ochoa MRN: [id number] DOB: July 26, 1956 Today's Date: 08/19/2016    History of Present Illness  60 y.o. male presenting with 1 day of hematemesis and chest pressure. s/p EGD 9/27 (esophageal varices) and planned paracentesis 9/28 PMH is significant for alcoholic cirrhosis, CAD, depression, HFpEF, HLD, OSA.    PT Comments    Pt admitted with above diagnosis. Pt currently with functional limitations due to balance and endurance deficits. Pt was able to ambulate with RW with good safety overall.  Agrees to use the RW at home.  Pt was treated for left BPPV today with decr in symptoms after treatment.  Pt to go home today with all education complete.  Will benefit from Outpt PT for vestibular rehab.  Nurse called CM about equipment and called MD regarding Outpt PT.  Pt will benefit from skilled PT to increase their independence and safety with mobility to allow discharge to the venue listed below.    Follow Up Recommendations  Outpatient PT;Supervision for mobility/OOB (for vestibular rehab)     Equipment Recommendations  Rolling walker with 5" wheels;3in1 (PT)    Recommendations for Other Services OT consult     Precautions / Restrictions Precautions Precautions: Fall Precaution Comments: reports "multiple" falls in past few weeks; "imbalance" Restrictions Weight Bearing Restrictions: No    Mobility  Bed Mobility Overal bed mobility: Modified Independent             General bed mobility comments: incr effort  Transfers Overall transfer level: Needs assistance Equipment used: Rolling walker (2 wheeled) Transfers: Sit to/from Stand Sit to Stand: Min guard;Supervision         General transfer comment: vc for technique (new to using RW)  Ambulation/Gait Ambulation/Gait assistance: Supervision;Min guard Ambulation Distance (Feet): 300 Feet Assistive device: Rolling walker (2 wheeled);None Gait Pattern/deviations:  Step-through pattern;Decreased stride length Gait velocity: decr   General Gait Details: Pt initially ambulated without RW with some imbalance but nothing that pt could not recover on his own.  With RW did well maintianing proximity to RW and overall good safety with RW.  Instructed pt that he should use RW initially at all times for safety.  Pt agreed.  Then in a few min, pt noted to be up walking around room without RW.  When asked pt to use RW, pt states,"I willuse it when I need it."   Stairs            Wheelchair Mobility    Modified Rankin (Stroke Patients Only)       Balance Overall balance assessment: Needs assistance;History of Falls Sitting-balance support: No upper extremity supported;Feet supported Sitting balance-Leahy Scale: Good     Standing balance support: During functional activity;No upper extremity supported Standing balance-Leahy Scale: Fair Standing balance comment: Pt can stand statically without UE support.                     Cognition Arousal/Alertness: Awake/alert Behavior During Therapy: WFL for tasks assessed/performed Overall Cognitive Status: No family/caregiver present to determine baseline cognitive functioning (difficulty giving specifics re: falls (how many, how))                      Exercises Other Exercises Other Exercises: Gave pt info for Outpt PT as well as BPPV handout that explains BPPV, explains precautions and explains Nestor Lewandowsky to complete if symptoms persist once home.      General Comments General comments (skin integrity,  edema, etc.): Pt tested for head thrust and noted left hypofunction.  Tested for BPPV and found left BPPV.  Treated with Epley maneuver for left BPPV with immediate results as pt not dizzy with ambulation even withhead turns.  No dizziness reciprocated after treatment completed.        Pertinent Vitals/Pain Pain Assessment: No/denies pain  VSS    Home Living                       Prior Function            PT Goals (current goals can now be found in the care plan section) Progress towards PT goals: Progressing toward goals    Frequency    Min 3X/week      PT Plan Current plan remains appropriate    Co-evaluation             End of Session Equipment Utilized During Treatment: Gait belt Activity Tolerance: Patient limited by fatigue Patient left: in bed;with call bell/phone within reach (sitting on EOB on departure)     Time: [id number] PT Time Calculation (min) (ACUTE ONLY): 53 min  Charges:  $Gait Training: 8-22 mins $Therapeutic Exercise: 8-22 mins $Therapeutic Activity: 8-22 mins $Canalith Rep Proc: 8-22 mins                    G Codes:      Mychele Seyller F September 16, 2016, 1:40 PM [employer] [facility] [phone] (pager)

## 2016-08-19 NOTE — Progress Notes (Signed)
RT NOTE:  Pt not ready for CPAP @ this time. Pt will let RN know to call RT when he is ready. RT will monitor.

## 2016-08-19 NOTE — Progress Notes (Signed)
Told patient walker and BSC will be coming up, patient declined waiting. Case manager aware, will ship equipment to house. Pt educated.

## 2016-08-19 NOTE — Progress Notes (Signed)
Family Medicine Teaching Service Daily Progress Note Intern Pager: [phone]  Patient name: Vincent Ochoa Medical record number: [id number] Date of birth: [birth year] Age: 60 y.o. Gender: male  Primary Care Provider: Ralene Ok, MD Consultants: GI Code Status: FULL  Pt Overview and Major Events to Date:  9/26 seen in [facility] c/w chest pressure and hematemesis, transported via EMS. ED eval without acute EKG changes, Hgb stable.  9/27 GI consulted, EGD scheduled 9/28 paracentesis for 2L  Assessment and Plan: TEREK Ochoa a 60 y.o.malepresenting with 1 day of hematemesis and chest pressure. PMH is significant for alcoholic cirrhosis, CAD, depression, HFpEF, HLD, OSA.  Hematemesis, resolved:Hx variceal bleeds 7 months ago requiring obliteration of varices (Balloon-Occluded Retrograde Transvenous Obliteration of Gastric Varices). Patient admitted for hematemesis August 14. GI was consulted and recommended PPI therapy indefinitely and sucralfate suspension for 6 weeks. Most recent EGD on record was performed on 8/2 and showed distal esophageal varies without signs of active bleeding post banding. FOBT+. Hgb 11.1 >10.2>10.0. EGD performed, varices banded, GI rec'd repeat EGD in 6 weeks. -Protonix IV q12h  -continue home carafate  - Zofran PRN  Chest Pressure: Risk factors for ACS: CAD and Obesity. iStat troponin negative and EKG without acute ST segment changes. Pain was originally radiating to back but is now much improved so doubt aortic dissection. Trop 0.00> 0.03 > <0.03. Repeat very brief episode overnight, EKG NSR, GI cocktail gave relief, trop <0.03.  -EKG PRN chest pain  Alcoholic cirrhosis with ascites:Heavy EtOH use in 1990s. Hx variceal bleeds s/p BRTO in 01/2016, variceal banding 06/2016. Followed by Dr. Michail Sermon.CT abdomen with marked ascites, fluid wave present on exam, would like to give paracentesis, will check with GI on whether this should be today or tomorrow. Child  Pugh C score 10 (life expectancy 1-3 years, abdominal surgery peri op mortality 82%). -IV protonix -holding spironolactone and lasix given low BPs   CAD: Patient with bare metal stent placement in 2015. Last cath in July 2016 Patient is not on anti-platelet therapy given history of recurrent GI bleeds.  -continue home Lipitor   HFrEF:  Last EF 50% (01/2015). Imdur, Lasix and Spironolactone were discontinued at recent hospitalization due to hypotension. However, patient believes he may still be taking some of these medications and reports taking Lasix 80 mg this morning.  -continue Coreg and Imdur  -hold Lasix and Spironolactone   Chronic Portal Vein Thrombosis: Found on CT imaging during admission in march 2017.  Seen on CT abdomen of this admission but smaller. GI was consulted at that time and recommendedthat the risks of anticoagulation outweighed the benefits given his significant bleed history.   Depression - Continue home bupropion  OSA - CPAP QHS  FEN/GI: heart healthy diet after EGD, IV PPI PPx: SCDs given GI bleed  Disposition: discharge today  Subjective:  Pt happily eating breakfast, amenable to discharge today. Curious about his borderline hypotension, reassured that this likely due to salt restriction and inactivity.   Objective: Temp:  [97.8 F (36.6 C)-98.7 F (37.1 C)] 98 F (36.7 C) (09/29 0730) Pulse Rate:  [58-73] 60 (09/29 0800) Resp:  [8-18] 11 (09/29 0800) BP: (99-115)/(57-70) 115/57 (09/29 0730) SpO2:  [95 %-98 %] 97 % (09/29 0800) Physical Exam: General: Overweight male lying in bed in NAD Cardiovascular: RRR. No murmurs appreciated.  Respiratory: CTAB. Normal WOB.  Gastrointestinal: +BS, mild distention, mild hepatomegaly MSK: Full ROM in all 4 extremities.  Neuro: Alert and oriented. No gross deficits.  Psych: Appropriate  mood and affect. Continues to be melancholy, although this is not an acute change.  Laboratory:  Recent Labs Lab  08/17/16 0251 08/18/16 0315 08/19/16 0556  WBC 3.5* 3.1* 2.8*  HGB 10.0* 10.0* 10.1*  HCT 30.7* 30.6* 31.0*  PLT 46* 50* 52*    Recent Labs Lab 08/16/16 1449 08/17/16 0251 08/18/16 0315  NA 136 136 136  K 4.2 4.0 4.4  CL 104 108 107  CO2 24 25 25   BUN 15 15 10   CREATININE 1.08 1.12 0.92  CALCIUM 9.1 8.2* 8.1*  PROT 7.3  --  6.1*  BILITOT 3.2*  --  2.3*  ALKPHOS 86  --  72  ALT 56  --  48  AST 73*  --  68*  GLUCOSE 101* 165* 136*    Imaging/Diagnostic Tests: US Paracentesis  Result Date: 08/18/2016 INDICATION: Abdominal distention secondary to ascites. Request diagnostic and therapeutic paracentesis. EXAM: ULTRASOUND GUIDED RIGHT LOWER QUADRANT PARACENTESIS MEDICATIONS: None. COMPLICATIONS: None immediate. PROCEDURE: Informed written consent was obtained from the patient after a discussion of the risks, benefits and alternatives to treatment. A timeout was performed prior to the initiation of the procedure. Initial ultrasound scanning demonstrates a large amount of ascites within the right lower abdominal quadrant. The right lower abdomen was prepped and draped in the usual sterile fashion. 1% lidocaine with epinephrine was used for local anesthesia. Following this, a 19 gauge, 10-cm, Yueh catheter was introduced. An ultrasound image was saved for documentation purposes. The paracentesis was performed. The catheter was removed and a dressing was applied. The patient tolerated the procedure well without immediate post procedural complication. FINDINGS: A total of approximately 2 L of clear yellow fluid was removed. Samples were sent to the laboratory as requested by the clinical team. IMPRESSION: Successful ultrasound-guided paracentesis yielding 2 liters of peritoneal fluid. Read by: Ascencion Dike PA-C Electronically Signed   By: Corrie Mckusick D.O.   On: 08/18/2016 13:34    Sela Hilding, MD 08/19/2016, 9:41 AM PGY-1, [place] Intern pager: [phone],  text pages welcome

## 2016-08-19 NOTE — Progress Notes (Signed)
[facility] Gastroenterology Progress Note  Vincent Ochoa 60 y.o. [birth year]  CC:  GI bleed   Subjective:  Patient feeling somewhat better. Weakness improving. Had a nonbloody bowel movement today. Denied nausea or vomiting  ROS : Denied abdominal pain,  Denied nausea or vomiting   Objective: Vital signs in last 24 hours: Vitals:   08/19/16 0730 08/19/16 0800  BP: (!) 115/57   Pulse: 60 60  Resp: 11 11  Temp: 98 F (36.7 C)     Physical Exam: General:   Alert,  Well-developed, well-nourished, pleasant and cooperative in NAD Oral mucosa moist. No icterus Lungs:  Clear throughout to auscultation.   No wheezes, crackles, or rhonchi. No acute distress. Heart:  Regular rate and rhythm; no murmurs, clicks, rubs,  or gallops. Abdomen: Mildly distended, nontender, bowel sounds present LE: no edema     Lab Results:  Recent Labs  08/17/16 0251 08/18/16 0315  NA 136 136  K 4.0 4.4  CL 108 107  CO2 25 25  GLUCOSE 165* 136*  BUN 15 10  CREATININE 1.12 0.92  CALCIUM 8.2* 8.1*    Recent Labs  08/16/16 1449 08/18/16 0315 08/18/16 1518  AST 73* 68*  --   ALT 56 48  --   ALKPHOS 86 72  --   BILITOT 3.2* 2.3*  --   PROT 7.3 6.1*  --   ALBUMIN 3.1* 2.5* 2.5*    Recent Labs  08/18/16 0315 08/19/16 0556  WBC 3.1* 2.8*  HGB 10.0* 10.1*  HCT 30.6* 31.0*  MCV 93.0 93.1  PLT 50* 52*    Recent Labs  08/16/16 1459  LABPROT 16.8*  INR 1.35      Assessment/Plan: Coffee-ground emesis in setting of history of esophageal varices and gastric varices.S/P EGD with EVL . History of gastric varices status post BX RTO History of esophageal varices. Status post band ligation 06/22/2016 Decompensated alcoholic cirrhosis Portal vein thrombus. Chronic Ascites  Recommendations ------------------------ - Patient status post EGD with 2 bands placement 09/27. Hemoglobin stable -  D/C octreotide and Rocephin - Lactulose titrated to 2-3 soft bowel movements per day - Okay to  discharge from GI standpoint. - Patient was scheduled for outpatient colonoscopy.  - Recommend follow-up in GI clinic/ Dr. Michail Sermon after discharge - GI will sign off. Call us back if needed   Otis Brace MD, FACP 08/19/2016, 9:16 AM  Pager [phone]  If no answer or after 5 PM call [phone]

## 2016-08-23 LAB — CULTURE, BODY FLUID-BOTTLE: CULTURE: NO GROWTH

## 2016-08-23 LAB — CULTURE, BODY FLUID W GRAM STAIN -BOTTLE

## 2016-08-24 ENCOUNTER — Encounter: Payer: Self-pay | Admitting: Family Medicine

## 2016-08-24 ENCOUNTER — Ambulatory Visit (INDEPENDENT_AMBULATORY_CARE_PROVIDER_SITE_OTHER): Payer: Medicaid Other | Admitting: Family Medicine

## 2016-08-24 VITALS — BP 138/64 | HR 77 | Ht 75.0 in | Wt 290.0 lb

## 2016-08-24 DIAGNOSIS — K7031 Alcoholic cirrhosis of liver with ascites: Secondary | ICD-10-CM

## 2016-08-24 DIAGNOSIS — R5383 Other fatigue: Secondary | ICD-10-CM

## 2016-08-24 DIAGNOSIS — K625 Hemorrhage of anus and rectum: Secondary | ICD-10-CM

## 2016-08-24 LAB — POCT HEMOGLOBIN: HEMOGLOBIN: 9.7 g/dL — AB (ref 14.1–18.1)

## 2016-08-24 MED ORDER — FUROSEMIDE 40 MG PO TABS: 40.0000 mg | ORAL_TABLET | Freq: Two times a day (BID) | ORAL | 0 refills | Status: DC

## 2016-08-24 NOTE — Progress Notes (Deleted)
i

## 2016-08-24 NOTE — Patient Instructions (Addendum)
You were seen today for a hospital follow up and you have re accumulated the swelling in your abdomen.  At this point I don't think you need to go to the hospital for a paracentesis.  I would like you to restart your lasix and take 40mg  two times daily.  Please come back in two weeks to see if you feel more comfortable and your swelling has gone down.    If you feel lightheaded or feel like you might pass out, please get your blood pressure checked at the pharmacy or come into clinic to have our nurse check your blood pressure.  Come back in one week or sooner if you are concerned.  Take care, Skya Mccullum L. Rosalyn Gess, [place] Resident PGY-1 08/24/2016 3:35 PM

## 2016-08-24 NOTE — Progress Notes (Signed)
    Subjective:  Vincent Ochoa is a 60 y.o. male who presents to the [facility] today for hospital follow-up.    HPI:  Ascites: Patient has history of alcoholic cirrhosis, recently admitted for bleeding esophageal varices. At that time he had 2 half liters taken off. He appears to have reaccumulated some fluid.  He is endorsing some shortness of breath, and fatigue.  Denies any fever, chills, does have some diffuse abdominal pain.  He was previously on Lasix and spironolactone. These were stopped while inpatient due to hypertension and he has not restarted these.   BRBPR:  Has a history of external hemorrhoids and reports seeing bright red blood per rectum in the toilet bowl since discharge last week. Does endorse some fatigue and shortness of breath. Baseline hemoglobin is about 10.  Denies seeing any dark stools.   PMH: Bleeding esophageal varices, alcohol cirrhosis ROS: See history of present illness  Objective:  Physical Exam: BP 138/64   Pulse 77   Ht 6\' 3"  (1.905 m)   Wt 290 lb (131.5 kg)   SpO2 96%   BMI 36.25 kg/m   Gen: 60 year old male in NAD, appears mildly uncomfortable CV: RRR with no murmurs appreciated Pulm: NWOB, CTAB with no crackles, wheezes, or rhonchi GI: Normal bowel sounds present. Soft, diffusely tender, markedly distended. MSK: 1+ pitting edema up to the knees bilaterally, no cyanosis or clubbing noted GU: Rectum showed 2 mild external hemorrhoids, without any obvious fissures.  Skin: warm, dry Neuro: grossly normal, moves all extremities Psych: Normal affect and thought content  Results for orders placed or performed in visit on 08/24/16 (from the past 72 hour(s))  POCT hemoglobin     Status: Abnormal   Collection Time: 08/24/16  3:00 PM  Result Value Ref Range   Hemoglobin 9.7 (A) 14.1 - 18.1 g/dL     Assessment/Plan:  Alcoholic cirrhosis of liver with ascites ([place]) Patient recently admitted for bleeding esophageal varices, EGD revealed active bleed and  required 2 bands. Patient has since not started his home Lasix and spironolactone.  His abdomen is markedly distended and tender and he has reaccumulated fluid.  At this point does not require admission for paracentesis. Vital signs are stable and patient has no signs of decompensation.  - Start Lasix 40 mg twice a day - See patient back in 1 week to reassess abdomen and symptoms and reconsider admission for paracentesis at that time - Asked patient to call [facility] today to schedule appointment to see Dr. Magnus Sinning  BRBPR (bright red blood per rectum) Patient reports bright red blood per rectum, since discharge from the hospital last week after being admitted for bleeding esophageal varices.  Taking this along with his shortness of breath and fatigue. I decided to do a point-of-care hemoglobin. This was 9.7 just about at his baseline. I suspect that this blood might be due to his hemorrhoids.  He stated that these have been quite itchy lately.  He has been taking a suppository for treatment. - Recommended Preparation H cream, or pads - Can follow-up on this issue in 1 week when we reassess his ascites

## 2016-08-24 NOTE — Assessment & Plan Note (Signed)
Patient reports bright red blood per rectum, since discharge from the hospital last week after being admitted for bleeding esophageal varices.  Taking this along with his shortness of breath and fatigue. I decided to do a point-of-care hemoglobin. This was 9.7 just about at his baseline. I suspect that this blood might be due to his hemorrhoids.  He stated that these have been quite itchy lately.  He has been taking a suppository for treatment. - Recommended Preparation H cream, or pads - Can follow-up on this issue in 1 week when we reassess his ascites

## 2016-08-24 NOTE — Assessment & Plan Note (Addendum)
Patient recently admitted for bleeding esophageal varices, EGD revealed active bleed and required 2 bands. Patient has since not started his home Lasix and spironolactone.  His abdomen is markedly distended and tender and he has reaccumulated fluid.  At this point does not require admission for paracentesis. Vital signs are stable and patient has no signs of decompensation.  - Start Lasix 40 mg twice a day - See patient back in 1 week to reassess abdomen and symptoms and reconsider admission for paracentesis at that time - Asked patient to call [facility] today to schedule appointment to see Dr. Magnus Sinning

## 2016-08-24 NOTE — Progress Notes (Signed)
Subjective  Patient is presenting with the following illnesses     Chief Complaint noted Review of Symptoms - see HPI PMH - Smoking status noted.     Objective Vital Signs reviewed     Assessments/Plans  No problem-specific Assessment & Plan notes found for this encounter.   See Encounter view if individual problem A/Ps not visible See after visit summary for details of patient instuctions 

## 2016-08-29 ENCOUNTER — Other Ambulatory Visit (HOSPITAL_COMMUNITY): Payer: Self-pay | Admitting: Interventional Radiology

## 2016-08-29 DIAGNOSIS — I864 Gastric varices: Secondary | ICD-10-CM

## 2016-09-01 ENCOUNTER — Ambulatory Visit (INDEPENDENT_AMBULATORY_CARE_PROVIDER_SITE_OTHER): Payer: Medicaid Other | Admitting: Student

## 2016-09-01 VITALS — Ht 75.0 in | Wt 292.6 lb

## 2016-09-01 DIAGNOSIS — R5383 Other fatigue: Secondary | ICD-10-CM

## 2016-09-01 DIAGNOSIS — E669 Obesity, unspecified: Secondary | ICD-10-CM

## 2016-09-01 DIAGNOSIS — Z6836 Body mass index (BMI) 36.0-36.9, adult: Secondary | ICD-10-CM

## 2016-09-01 DIAGNOSIS — IMO0001 Reserved for inherently not codable concepts without codable children: Secondary | ICD-10-CM

## 2016-09-01 NOTE — Assessment & Plan Note (Signed)
This is multifactorial in patient with multiple comorbidities. He has cirrhosis and dCHF. He also has anemia with Hgb of 9.6 likely due to GI blood loss. Recent TSH about a year ago within normal limit. -Patient agreed to continue life style change with exercise and diet  -Consider repeat TSH and repeat CBC

## 2016-09-01 NOTE — Patient Instructions (Addendum)
It is nice to see you!  We have set these goals today 1. Exercise for 1 hour a day for at least 5 days of the week (swimming and/or walking)  2. Continue 3 regular meals daily with protein, fresh vegetables 3. Limiting sodium intake to 2000 mg a day, preferably 1500 mg a day 4. Bringing spouse to one of your future visit  Follow up on 10/20/2016 at 10 am. This should be 60 minutes appointment.

## 2016-09-01 NOTE — Progress Notes (Signed)
Primary concerns today: Weight management and fatigue. Patient reports working on his previous goals until his recent hospitalization for GI issues on 08/16/2016. He was hospitalized for three days. He reports this energy was low since he was discharged from hospital. He says he still continues to walk as much as he can but reports getting tired easily and sleeping a lot. He reports eating a lot of fat free yogurt. He is also stressed about his brother-in-law at home, who is a lot of burden emotionally and financially.   24-hr recall:  (Up at 7:30 AM) B ( AM)-  Plain Boost (one bottle),  bunch of water Snk ( AM)-  Banana (one),  L ( PM)- Hot dog with a bun, water  Snk ( PM)- none  D ( PM)-  Meat love (little plate about 14 oz ~3 slices),  and piece of garlic bread and went to sleep Snk ( PM)- no snack  Typical day? No. Normally he eats good.   Recent physical activity includes walking daily about 1.5 miles/day, slow, has to walk with cane, feeding cows Was following goals until his recent admission.   Progress Towards Goal(s):  Some progress.     Intervention/New goal:  1. Exercise for 1 hour a day for at least 5 days of the week (swimming and/or walking)  2. Continue 3 regular meals daily with protein, fresh vegetables 3. Limiting sodium intake to 2000 mg a day, preferably 1500 mg a day (new) 4. Bringing spouse to one of your future visit (new)  Fatigue This is multifactorial in patient with multiple comorbidities. He has cirrhosis and dCHF. He also has anemia with Hgb of 9.6 likely due to GI blood loss. Recent TSH about a year ago within normal limit. -Patient agreed to continue life style change with exercise and diet  -Consider repeat TSH and repeat CBC  Handouts given during visit include:  AVS  Barriers to learning/adherence to lifestyle change: comorbidity and home stress  Follow up on 10/20/2016 at 10 am.

## 2016-09-12 DIAGNOSIS — Z8673 Personal history of transient ischemic attack (TIA), and cerebral infarction without residual deficits: Secondary | ICD-10-CM | POA: Insufficient documentation

## 2016-09-12 DIAGNOSIS — K625 Hemorrhage of anus and rectum: Secondary | ICD-10-CM | POA: Insufficient documentation

## 2016-09-12 DIAGNOSIS — I5042 Chronic combined systolic (congestive) and diastolic (congestive) heart failure: Secondary | ICD-10-CM | POA: Insufficient documentation

## 2016-09-12 DIAGNOSIS — I252 Old myocardial infarction: Secondary | ICD-10-CM | POA: Insufficient documentation

## 2016-09-12 DIAGNOSIS — Z87891 Personal history of nicotine dependence: Secondary | ICD-10-CM | POA: Insufficient documentation

## 2016-09-12 DIAGNOSIS — R1030 Lower abdominal pain, unspecified: Secondary | ICD-10-CM | POA: Insufficient documentation

## 2016-09-12 DIAGNOSIS — I2510 Atherosclerotic heart disease of native coronary artery without angina pectoris: Secondary | ICD-10-CM | POA: Insufficient documentation

## 2016-09-13 ENCOUNTER — Emergency Department (HOSPITAL_COMMUNITY): Payer: Self-pay

## 2016-09-13 ENCOUNTER — Encounter (HOSPITAL_COMMUNITY): Payer: Self-pay | Admitting: Emergency Medicine

## 2016-09-13 ENCOUNTER — Emergency Department (HOSPITAL_COMMUNITY)
Admission: EM | Admit: 2016-09-13 | Discharge: 2016-09-13 | Disposition: A | Discharge status: Home / Self Care | Payer: Self-pay | Attending: Emergency Medicine | Admitting: Emergency Medicine

## 2016-09-13 DIAGNOSIS — K625 Hemorrhage of anus and rectum: Secondary | ICD-10-CM

## 2016-09-13 DIAGNOSIS — R1030 Lower abdominal pain, unspecified: Secondary | ICD-10-CM

## 2016-09-13 LAB — CBC
HEMATOCRIT: 33 % — AB (ref 39.0–52.0)
HEMOGLOBIN: 11.1 g/dL — AB (ref 13.0–17.0)
MCH: 30.5 pg (ref 26.0–34.0)
MCHC: 33.6 g/dL (ref 30.0–36.0)
MCV: 90.7 fL (ref 78.0–100.0)
PLATELETS: 61 10*3/uL — AB (ref 150–400)
RBC: 3.64 MIL/uL — AB (ref 4.22–5.81)
RDW: 15.5 % (ref 11.5–15.5)
WBC: 5.7 10*3/uL (ref 4.0–10.5)

## 2016-09-13 LAB — BASIC METABOLIC PANEL
Anion gap: 5 (ref 5–15)
BUN: 7 mg/dL (ref 6–20)
CHLORIDE: 105 mmol/L (ref 101–111)
CO2: 26 mmol/L (ref 22–32)
CREATININE: 0.92 mg/dL (ref 0.61–1.24)
Calcium: 8.7 mg/dL — ABNORMAL LOW (ref 8.9–10.3)
GFR calc non Af Amer: >60 mL/min (ref >60)
Glucose, Bld: 90 mg/dL (ref 65–99)
POTASSIUM: 4.3 mmol/L (ref 3.5–5.1)
SODIUM: 136 mmol/L (ref 135–145)

## 2016-09-13 LAB — URINALYSIS, ROUTINE W REFLEX MICROSCOPIC
BILIRUBIN URINE: NEGATIVE
Glucose, UA: NEGATIVE mg/dL
KETONES UR: NEGATIVE mg/dL
LEUKOCYTES UA: NEGATIVE
NITRITE: NEGATIVE
PH: 7 (ref 5.0–8.0)
PROTEIN: NEGATIVE mg/dL
Specific Gravity, Urine: 1.043 — ABNORMAL HIGH (ref 1.005–1.030)

## 2016-09-13 LAB — URINE MICROSCOPIC-ADD ON

## 2016-09-13 LAB — SAMPLE TO BLOOD BANK

## 2016-09-13 LAB — POC OCCULT BLOOD, ED: FECAL OCCULT BLD: NEGATIVE

## 2016-09-13 LAB — I-STAT TROPONIN, ED: Troponin i, poc: 0 ng/mL (ref 0.00–0.08)

## 2016-09-13 MED ORDER — HYDROMORPHONE HCL 2 MG/ML IJ SOLN
1.0000 mg | Freq: Once | INTRAMUSCULAR | Status: AC
  Administered 2016-09-13: 1 mg via INTRAVENOUS
  Filled 2016-09-13: qty 1

## 2016-09-13 MED ORDER — OXYCODONE-ACETAMINOPHEN 5-325 MG PO TABS: 1.0000 | ORAL_TABLET | ORAL | 0 refills | Status: DC | PRN

## 2016-09-13 MED ORDER — IOPAMIDOL (ISOVUE-300) INJECTION 61%
INTRAVENOUS | Status: AC
  Filled 2016-09-13: qty 100

## 2016-09-13 NOTE — ED Notes (Signed)
Pt brought back from radiology, pt requesting nitro for his CP. Informed pt that we are not allowed to give nitro in the waiting area or without IV access. Pt states that he does not care about the wait times and he is going to call his wife to bring his nitro and take it himself. Advised pt that he needs to wait to see the doctor and he states he does not care, he is going to fix his pain.

## 2016-09-13 NOTE — ED Triage Notes (Signed)
Pt. reports central chest pain onset this evening with mild SOB , no nausea or diaphoresis , pt. added rectal bleeding since Oct.13 ,2017 with low abdominal pain and low back pain .

## 2016-09-13 NOTE — ED Notes (Signed)
Discharge instructions, prescription and follow ups reviewed - voiced understanding

## 2016-09-13 NOTE — ED Notes (Addendum)
Pt states he has been having some rectal bleeding for the past week, bright red blood when he wipes. Pt states that he is also having some chest pain that starts at his liver and ends in his chest also having back pain with same. Pt states he did take a nitro while in the waiting room. Pt states he was hospitalized with these same symptoms a month ago.

## 2016-09-13 NOTE — ED Notes (Signed)
Patient talking on the phone.  Ice chips given.

## 2016-09-13 NOTE — ED Notes (Signed)
Back from CT

## 2016-09-13 NOTE — ED Triage Notes (Signed)
Pt. became upset with nurse after explaining process/wait time , stated that he will call the ambulance and will not seat in the waiting area .

## 2016-09-13 NOTE — ED Provider Notes (Signed)
[facility] DEPT Provider Note   CSN: [id number] Arrival date & time: 09/12/16  2353     History   Chief Complaint Chief Complaint  Patient presents with  . Chest Pain  . Rectal Bleeding    HPI Vincent Ochoa is a 60 y.o. male.  Patient presents with complaint of lower abdominal pain that wraps around to the lower back. It started yesterday morning and worsened through the day. No nausea or vomiting. He reports BRB per rectum multiple times through the day. He has a history of upper GI bleeding in September of this year but has no similar symptoms to that episode. No fever, cough, urinary symptoms or testicular pain. He reports onset of chest pain after arrival to the emergency department. He took a nitroglycerin without relief. He reports he takes nitroglycerin every day and the last dose was yesterday morning. No SOB, diaphoresis.   The history is provided by the patient. No language interpreter was used.  Rectal Bleeding  Associated symptoms: abdominal pain   Associated symptoms: no fever, no light-headedness and no vomiting     Past Medical History:  Diagnosis Date  . Arthritis    "hands, knees, back" (07/05/2016)  . Back pain, lumbosacral 2014  . Coronary artery disease    a. Evaluated by Dr. Stanford Breed 2012 - nonobstructive 2007-2008 by cath. b. Abnormal stress test 10/2014 but cath deferred temporarily due to thrombocytopenia. c. 10/2014: readm with AF-RVR/NSTEMI - s/p BMS to mLCx 11/04/14 (mod dz in RCA);  d. relook cath 01/2015 and 06/02/2015 patent stent, nonobs CAD, EF 55-65%.  . Elevated LFTs   . Facial cellulitis 2014   Periorbital  . Fatty liver US - 2012  . Frequent headaches   . GERD (gastroesophageal reflux disease)   . History of blood transfusion 01/2016   "lost alot of blood vomiting"  . Hyperlipidemia   . Morbid obesity ([place])   . Obesity   . OSA on CPAP    severe with AHI 56/hr with successful CPAP titration to 18cm H2O  . PAF (paroxysmal atrial  fibrillation) ([place])    a. Episode 10/2014 at time of NSTEMI, spont converted to NSR. Observation for further episodes (not on anticoag at present time due to [facility] = 1, thrombocytopenia).  . Psoriasis   . Sinus bradycardia   . Stroke [facility])    "I've been told I'd had a mini stroke on my left side" (07/05/2016)  . Thrombocytopenia ([place])    a. Onset unclear, but noted 2012. ? 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 s/p prednisone, IVIG.    Patient Active Problem List   Diagnosis Date Noted  . BRBPR (bright red blood per rectum) 08/24/2016  . Bronchitis   . Chills without fever 08/08/2016  . Cellulitis of left hand   . Essential hypertension   . Hematemesis 06/21/2016  . Chronic combined systolic and diastolic congestive heart failure ([place])   . Erectile dysfunction 06/03/2016  . Dyspnea   . Generalized abdominal pain   . Portal vein thrombosis   . Ascites 02/16/2016  . Alcoholic cirrhosis of liver with ascites ([place])   . Esophageal varices with bleeding ([place])   . GI bleed 02/04/2016  . Gastrointestinal hemorrhage 02/04/2016  . Blood loss anemia   . Feeling down 01/10/2016  . Chest pain 01/02/2016  . Diastolic heart failure ([place]) 10/07/2015  . Leukopenia 09/15/2015  . Acrochordon 07/18/2015  . PAF (paroxysmal atrial fibrillation) ([place])   . Obese   .  Transaminitis 04/23/2015  . Knee pain, bilateral 03/05/2015  . Depression 01/08/2015  . OSA (obstructive sleep apnea)   . Physical deconditioning 12/08/2014  . Hyperlipidemia LDL goal <70 11/05/2014  . NSTEMI (non-ST elevated myocardial infarction) ([place])   . Back pain, lumbosacral 01/04/2013  . Thrombocytopenia ([place]) 11/29/2012  . Decreased libido 08/15/2011  . GERD (gastroesophageal reflux disease) 08/15/2011  . Coronary artery disease involving native coronary artery of native heart without angina pectoris 07/29/2011  . Psoriasis 07/29/2011  . Fatigue 07/29/2011    Past Surgical History:    Procedure Laterality Date  . CARDIAC CATHETERIZATION    . CARDIAC CATHETERIZATION N/A 06/02/2015   Procedure: Left Heart Cath and Coronary Angiography;  Surgeon: Jettie Booze, MD;  Location: [facility] CV LAB;  Service: Cardiovascular;  Laterality: N/A;  . ESOPHAGOGASTRODUODENOSCOPY N/A 06/22/2016   Procedure: ESOPHAGOGASTRODUODENOSCOPY (EGD);  Surgeon: Clarene Essex, MD;  Location: [facility] ENDOSCOPY;  Service: Endoscopy;  Laterality: N/A;  . ESOPHAGOGASTRODUODENOSCOPY (EGD) WITH PROPOFOL N/A 08/17/2016   Procedure: ESOPHAGOGASTRODUODENOSCOPY (EGD) WITH PROPOFOL;  Surgeon: Otis Brace, MD;  Location: [facility];  Service: Gastroenterology;  Laterality: N/A;  . FACIAL COSMETIC SURGERY  1990s   "rodeo-related injury"  . KNEE ARTHROPLASTY Left 1970's   "put cartilage in it"  . LAPAROSCOPIC CHOLECYSTECTOMY  2007  . LEFT HEART CATHETERIZATION WITH CORONARY ANGIOGRAM N/A 11/04/2014   Procedure: LEFT HEART CATHETERIZATION WITH CORONARY ANGIOGRAM;  Surgeon: Jettie Booze, MD;  Location: [facility] CATH LAB;  Service: Cardiovascular;  Laterality: N/A;  . LEFT HEART CATHETERIZATION WITH CORONARY ANGIOGRAM N/A 01/28/2015   Procedure: LEFT HEART CATHETERIZATION WITH CORONARY ANGIOGRAM;  Surgeon: Sherren Mocha, MD;  Location: [facility] CATH LAB;  Service: Cardiovascular;  Laterality: N/A;  . liver cirrhosis    . RADIOLOGY WITH ANESTHESIA N/A 02/04/2016   Procedure: RADIOLOGY WITH ANESTHESIA;  Surgeon: Medication Radiologist, MD;  Location: [facility];  Service: Radiology;  Laterality: N/A;  . TONSILLECTOMY  1960's       Home Medications    Prior to Admission medications   Medication Sig Start Date End Date Taking? Authorizing Provider  buPROPion (WELLBUTRIN XL) 150 MG 24 hr tablet Take 1 tablet (150 mg total) by mouth daily. 08/12/16  Yes Sela Hilding, MD  carvedilol (COREG) 3.125 MG tablet Take 1 tablet (3.125 mg total) by mouth 2 (two) times daily with a meal. 08/12/16  Yes Sela Hilding, MD   furosemide (LASIX) 40 MG tablet Take 1 tablet (40 mg total) by mouth 2 (two) times daily. 08/24/16  Yes Eloise Levels, MD  isosorbide mononitrate (IMDUR) 30 MG 24 hr tablet Take 30 mg by mouth daily.   Yes Historical Provider, MD  lactulose (CHRONULAC) 10 GM/15ML solution Take 15 mLs (10 g total) by mouth 3 (three) times daily. 08/19/16  Yes Sela Hilding, MD  nitroGLYCERIN (NITROSTAT) 0.4 MG SL tablet Place 1 tablet (0.4 mg total) under the tongue every 5 (five) minutes as needed for chest pain (up to 3 doses). 10/31/14  Yes Dayna N Dunn, PA-C  pantoprazole (PROTONIX) 40 MG tablet Take 1 tablet (40 mg total) by mouth 2 (two) times daily. 06/03/16  Yes Sela Hilding, MD  sucralfate (CARAFATE) 1 g tablet Take 1 tablet (1 g total) by mouth 4 (four) times daily -  with meals and at bedtime. Dissolve in water. 08/19/16  Yes Sela Hilding, MD  atorvastatin (LIPITOR) 40 MG tablet Take 1 tablet (40 mg total) by mouth daily at 8 pm. Patient not taking: Reported on 09/13/2016 06/23/16  Everrett Coombe, MD    Family History Family History  Problem Relation Age of Onset  . Cirrhosis Mother   . Heart attack Mother   . Hypertension Mother   . Hypertension Father   . Stroke Father   . Arthritis Maternal Grandmother   . Heart disease Sister   . Diabetes Sister   . Cancer - Ovarian Sister   . Heart disease      Maternal/paternal grandparents  . Heart attack Maternal Aunt     Social History Social History  Substance Use Topics  . Smoking status: Former Smoker    Packs/day: 1.50    Years: 43.00    Types: Cigarettes    Quit date: 02/19/2016  . Smokeless tobacco: Former Systems developer  . Alcohol use 0.0 oz/week     Comment: 07/05/2016 "mostly stopped in 1979; might have a beer q 6 months or so"     Allergies   Asa [aspirin]; Eliquis [apixaban]; Nsaids; Plavix [clopidogrel]; and Statins   Review of Systems Review of Systems  Constitutional: Negative for chills and fever.  Respiratory:  Negative.  Negative for cough and shortness of breath.   Cardiovascular: Positive for chest pain.  Gastrointestinal: Positive for abdominal pain, blood in stool and hematochezia. Negative for constipation, diarrhea, nausea and vomiting.  Genitourinary: Negative.  Negative for dysuria and testicular pain.  Musculoskeletal: Positive for back pain.  Skin: Negative.   Neurological: Negative.  Negative for weakness and light-headedness.     Physical Exam Updated Vital Signs BP 132/83 (BP Location: Right Arm)   Pulse 86   Temp 98.8 F (37.1 C) (Oral)   Resp 17   Ht 6\' 3"  (1.905 m)   Wt 131.1 kg   SpO2 96%   BMI 36.12 kg/m   Physical Exam  Constitutional: He is oriented to person, place, and time. He appears well-developed and well-nourished.  HENT:  Head: Normocephalic.  Neck: Normal range of motion. Neck supple.  Cardiovascular: Normal rate and regular rhythm.   Pulmonary/Chest: Effort normal and breath sounds normal. He has no wheezes. He has no rales.  Abdominal: Soft. Bowel sounds are normal. There is tenderness. There is no rebound and no guarding.  Obese abdomen. Tender across lower abdomen which extends to lower back.   Musculoskeletal: Normal range of motion.  Neurological: He is alert and oriented to person, place, and time.  Skin: Skin is warm and dry. No rash noted.  Psychiatric: He has a normal mood and affect.     ED Treatments / Results  Labs (all labs ordered are listed, but only abnormal results are displayed) Labs Reviewed  BASIC METABOLIC PANEL - Abnormal; Notable for the following:       Result Value   Calcium 8.7 (*)    All other components within normal limits  CBC - Abnormal; Notable for the following:    RBC 3.64 (*)    Hemoglobin 11.1 (*)    HCT 33.0 (*)    Platelets 61 (*)    All other components within normal limits  I-STAT TROPOININ, ED  POC OCCULT BLOOD, ED  SAMPLE TO BLOOD BANK    EKG  EKG Interpretation  Date/Time:  Tuesday September 13 2016 00:00:29 EDT Ventricular Rate:  82 PR Interval:    QRS Duration: 90 QT Interval:  378 QTC Calculation: 441 R Axis:   65 Text Interpretation:  Sinus rhythm Normal ECG When compared with ECG of 08/18/2016, No significant change was found Confirmed by Roxanne Mins  MD, DAVID (  [id number]) on 09/13/2016 1:14:56 AM       Radiology Dg Chest 2 View  Result Date: 09/13/2016 CLINICAL DATA:  60 year old male with chest pain. EXAM: CHEST  2 VIEW COMPARISON:  Chest radiograph dated 08/16/2016 FINDINGS: Diffuse mid to lower lung field interstitial nodularity and coarsening, similar to prior radiograph. There is no focal consolidation, pleural effusion, or pneumothorax. The cardiac silhouette is within normal limits. No acute osseous pathology. IMPRESSION: No active cardiopulmonary disease. Electronically Signed   By: Anner Crete M.D.   On: 09/13/2016 02:30    Procedures Procedures (including critical care time)  Medications Ordered in ED Medications  HYDROmorphone (DILAUDID) injection 1 mg (not administered)     Initial Impression / Assessment and Plan / ED Course  I have reviewed the triage vital signs and the nursing notes.  Pertinent labs & imaging results that were available during my care of the patient were reviewed by me and considered in my medical decision making (see chart for details).  Clinical Course    Patient presents for evaluation of lower abdominal pain, back pain and rectal bleeding. No fever.   He is non-toxic in appearance. REsting comfortably on the bed, in NAD. VSS, no tachycardia, fever. No evidence sepsis or infection. Guaiac Negative, hgb stable.   CT abdomen without acute finding. He is examined by Dr. Roxanne Mins and is found to be stable for discharge home.   Final Clinical Impressions(s) / ED Diagnoses   Final diagnoses:  None   1. Lower abdominal pain 2. Rectal bleeding New Prescriptions New Prescriptions   No medications on file     Charlann Lange,  Hershal Coria [id number]    Delora Fuel, MD [phone]

## 2016-09-13 NOTE — ED Notes (Signed)
Patient transported to CT - oxygen applied - 3l  Patient states he is io a CPAP at home

## 2016-09-14 ENCOUNTER — Ambulatory Visit
Admission: RE | Admit: 2016-09-14 | Discharge: 2016-09-14 | Disposition: A | Discharge status: Home / Self Care | Payer: Self-pay | Source: Ambulatory Visit | Attending: Interventional Radiology | Admitting: Interventional Radiology

## 2016-09-14 ENCOUNTER — Ambulatory Visit: Payer: Self-pay | Admitting: Internal Medicine

## 2016-09-14 DIAGNOSIS — I864 Gastric varices: Secondary | ICD-10-CM

## 2016-09-14 HISTORY — PX: IR GENERIC HISTORICAL: IMG1180011

## 2016-09-14 NOTE — Progress Notes (Signed)
Patient ID: Vincent Ochoa, male   DOB: December 26, 1955, 60 y.o.   MRN: [id number]         Chief Complaint: Post BRTO  Referring Physician(s): Schooler (GI)  History of Present Illness: Vincent Ochoa is a 60 y.o. male with past medical history significant for CAD, chronic back pain, morbid obesity, obstructive sleep apnea, paroxysmal atrial fibrillation, psoriasis, stroke, thrombocytopenia, cirrhosis and ascites who underwent a technically successful BRTO procedure on 02/05/2016. He returns today for post procedural evaluation and management. He is unaccompanied though serves as her own historian.  Patient was recently discharged from the hospital (08/19/2016) after being admitted for chest pain and hematemesis. Endoscopy performed on 08/17/2016 demonstrated gastritis and esophageal varices without signs of active bleeding (note, there was no mention of gastric varices). The esophageal varices were prophylactically banded. No episodes of hematemesis occurred during the admission. The patient denies any recurrent episodes of hematemesis since discharge.  Patient states that he has continued to experience fatigue. Additionally, the patient states he experiences at least 4-5 loose stools per day, at least 1-2 of which are bloody. Patient denies of vomiting or hematemesis. Patient denies yellowing of the skin or eyes. Patient denies change in abdominal girth. No altered mental status.  Patient reports diffuse lower abdominal and back pain for which he presented to the emergency department yesterday however workup (including CT scan of the abdomen and pelvis) was unremarkable.  The patient was prescribed opioid analgesics which he states has improved his lower abdominal and back pain.   Past Medical History:  Diagnosis Date  . Arthritis    "hands, knees, back" (07/05/2016)  . Back pain, lumbosacral 2014  . Coronary artery disease    a. Evaluated by Dr. Stanford Breed 2012 - nonobstructive 2007-2008 by cath. b.  Abnormal stress test 10/2014 but cath deferred temporarily due to thrombocytopenia. c. 10/2014: readm with AF-RVR/NSTEMI - s/p BMS to mLCx 11/04/14 (mod dz in RCA);  d. relook cath 01/2015 and 06/02/2015 patent stent, nonobs CAD, EF 55-65%.  . Elevated LFTs   . Facial cellulitis 2014   Periorbital  . Fatty liver US - 2012  . Frequent headaches   . GERD (gastroesophageal reflux disease)   . History of blood transfusion 01/2016   "lost alot of blood vomiting"  . Hyperlipidemia   . Morbid obesity ([name])   . Obesity   . OSA on CPAP    severe with AHI 56/hr with successful CPAP titration to 18cm H2O  . PAF (paroxysmal atrial fibrillation) ([place])    a. Episode 10/2014 at time of NSTEMI, spont converted to NSR. Observation for further episodes (not on anticoag at present time due to [facility] = 1, thrombocytopenia).  . Psoriasis   . Sinus bradycardia   . Stroke [facility])    "I've been told I'd had a mini stroke on my left side" (07/05/2016)  . Thrombocytopenia ([place])    a. Onset unclear, but noted 2012. ? 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 s/p prednisone, IVIG.    Past Surgical History:  Procedure Laterality Date  . CARDIAC CATHETERIZATION    . CARDIAC CATHETERIZATION N/A 06/02/2015   Procedure: Left Heart Cath and Coronary Angiography;  Surgeon: Jettie Booze, MD;  Location: [place] [facility];  Service: Cardiovascular;  Laterality: N/A;  . ESOPHAGOGASTRODUODENOSCOPY N/A 06/22/2016   Procedure: ESOPHAGOGASTRODUODENOSCOPY (EGD);  Surgeon: Clarene Essex, MD;  Location: [facility] ENDOSCOPY;  Service: Endoscopy;  Laterality: N/A;  . ESOPHAGOGASTRODUODENOSCOPY (EGD) WITH PROPOFOL N/A  08/17/2016   Procedure: ESOPHAGOGASTRODUODENOSCOPY (EGD) WITH PROPOFOL;  Surgeon: Otis Brace, MD;  Location: [place];  Service: Gastroenterology;  Laterality: N/A;  . FACIAL COSMETIC SURGERY  1990s   "rodeo-related injury"  . KNEE ARTHROPLASTY Left 1970's   "put cartilage in it"    . LAPAROSCOPIC CHOLECYSTECTOMY  2007  . LEFT HEART CATHETERIZATION WITH CORONARY ANGIOGRAM N/A 11/04/2014   Procedure: LEFT HEART CATHETERIZATION WITH CORONARY ANGIOGRAM;  Surgeon: Jettie Booze, MD;  Location: [facility] CATH LAB;  Service: Cardiovascular;  Laterality: N/A;  . LEFT HEART CATHETERIZATION WITH CORONARY ANGIOGRAM N/A 01/28/2015   Procedure: LEFT HEART CATHETERIZATION WITH CORONARY ANGIOGRAM;  Surgeon: Sherren Mocha, MD;  Location: [facility] CATH LAB;  Service: Cardiovascular;  Laterality: N/A;  . liver cirrhosis    . RADIOLOGY WITH ANESTHESIA N/A 02/04/2016   Procedure: RADIOLOGY WITH ANESTHESIA;  Surgeon: Medication Radiologist, MD;  Location: [place];  Service: Radiology;  Laterality: N/A;  . TONSILLECTOMY  1960's    Allergies: Asa [aspirin]; Eliquis [apixaban]; Nsaids; Plavix [clopidogrel]; and Statins  Medications: Prior to Admission medications   Medication Sig Start Date End Date Taking? Authorizing Provider  atorvastatin (LIPITOR) 40 MG tablet Take 1 tablet (40 mg total) by mouth daily at 8 pm. 06/23/16  Yes Everrett Coombe, MD  buPROPion (WELLBUTRIN XL) 150 MG 24 hr tablet Take 1 tablet (150 mg total) by mouth daily. 08/12/16  Yes Sela Hilding, MD  carvedilol (COREG) 3.125 MG tablet Take 1 tablet (3.125 mg total) by mouth 2 (two) times daily with a meal. 08/12/16  Yes Sela Hilding, MD  furosemide (LASIX) 40 MG tablet Take 1 tablet (40 mg total) by mouth 2 (two) times daily. 08/24/16  Yes Eloise Levels, MD  isosorbide mononitrate (IMDUR) 30 MG 24 hr tablet Take 30 mg by mouth daily.   Yes Historical Provider, MD  lactulose (CHRONULAC) 10 GM/15ML solution Take 15 mLs (10 g total) by mouth 3 (three) times daily. 08/19/16  Yes Sela Hilding, MD  nitroGLYCERIN (NITROSTAT) 0.4 MG SL tablet Place 1 tablet (0.4 mg total) under the tongue every 5 (five) minutes as needed for chest pain (up to 3 doses). 10/31/14  Yes Dayna N Dunn, PA-C  oxyCODONE-acetaminophen (PERCOCET/ROXICET)  5-325 MG tablet Take 1-2 tablets by mouth every 4 (four) hours as needed for severe pain. 09/13/16  Yes Shari Upstill, PA-C  pantoprazole (PROTONIX) 40 MG tablet Take 1 tablet (40 mg total) by mouth 2 (two) times daily. 06/03/16  Yes Sela Hilding, MD  sucralfate (CARAFATE) 1 g tablet Take 1 tablet (1 g total) by mouth 4 (four) times daily -  with meals and at bedtime. Dissolve in water. 08/19/16  Yes Sela Hilding, MD     Family History  Problem Relation Age of Onset  . Cirrhosis Mother   . Heart attack Mother   . Hypertension Mother   . Hypertension Father   . Stroke Father   . Arthritis Maternal Grandmother   . Heart disease Sister   . Diabetes Sister   . Cancer - Ovarian Sister   . Heart disease      Maternal/paternal grandparents  . Heart attack Maternal Aunt     Social History   Social History  . Marital status: Married    Spouse name: N/A  . Number of children: N/A  . Years of education: N/A   Social History Main Topics  . Smoking status: Former Smoker    Packs/day: 1.50    Years: 43.00    Types: Cigarettes  Quit date: 02/19/2016  . Smokeless tobacco: Former Systems developer  . Alcohol use 0.0 oz/week     Comment: 07/05/2016 "mostly stopped in 1979; might have a beer q 6 months or so"  . Drug use: No  . Sexual activity: Not Currently   Other Topics Concern  . Not on file   Social History Narrative   Lives in [place].    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     ECOG Status: 1 - Symptomatic but completely ambulatory  Review of Systems: A 12 point ROS discussed and pertinent positives are indicated in the HPI above.  All other systems are negative.  Review of Systems  Constitutional: Positive for activity change and fatigue. Negative for fever and unexpected weight change.  Respiratory: Negative for shortness of breath.   Cardiovascular:       Patient reported chest pain  yesterday [facility] emergency department though states this was secondary to agitation and having to wait. He is currently without chest pain.  Gastrointestinal: Positive for abdominal pain, blood in stool and diarrhea. Negative for abdominal distention and nausea.  Skin: Negative.     Vital Signs: BP 116/63 (BP Location: Left Arm, Patient Position: Sitting, Cuff Size: Large)   Pulse 77   Temp 98.2 F (36.8 C) (Oral)   Resp 17   Ht 6\' 3"  (1.905 m)   Wt 278 lb 9.6 oz (126.4 kg)   SpO2 98%   BMI 34.82 kg/m   Physical Exam  Eyes: No scleral icterus.  Psychiatric: He has a normal mood and affect.    Imaging:   CT scan of abdomen and pelvis performed 09/13/2016, 08/18/2016 as well as the preprocedural CT scan of the abdomen and pelvis performed 02/06/2016 were reviewed with the patient.  Images demonstrate a terribly excellent result with complete sclerosant of previously identified large gastric varices. A minimal amount of sclerosant again seen within the cranial aspect of the left renal vein, however this approved non-occlusive on contrast enhanced exam performed on 05/16/2016. Redemonstrated small/trace amount of intra-abdominal ascites.  Dg Chest 2 View  Result Date: 09/13/2016 CLINICAL DATA:  60 year old male with chest pain. EXAM: CHEST  2 VIEW COMPARISON:  Chest radiograph dated 08/16/2016 FINDINGS: Diffuse mid to lower lung field interstitial nodularity and coarsening, similar to prior radiograph. There is no focal consolidation, pleural effusion, or pneumothorax. The cardiac silhouette is within normal limits. No acute osseous pathology. IMPRESSION: No active cardiopulmonary disease. Electronically Signed   By: Anner Crete M.D.   On: 09/13/2016 02:30   Dg Chest 2 View  Result Date: 08/16/2016 CLINICAL DATA:  Midsternal chest pain and pain between shoulder blades for 12 hours. History of MI, cardiac stents, atrial fibrillation, cirrhosis. Swollen abdomen. EXAM: CHEST  2 VIEW  COMPARISON:  Chest x-rays dated 02/17/2016, 02/03/2016 and 01/01/2016. FINDINGS: The heart size and mediastinal contours are within normal limits. Both lungs are clear. The visualized skeletal structures are unremarkable. IMPRESSION: No active cardiopulmonary disease. Electronically Signed   By: Franki Cabot M.D.   On: 08/16/2016 16:19   Ct Abdomen Pelvis W Contrast  Result Date: 09/13/2016 CLINICAL DATA:  Lower abdominal rectal pain and rectal bleeding since October 13th. History of hematemesis, cirrhosis. EXAM: CT ABDOMEN AND PELVIS WITH CONTRAST TECHNIQUE: Multidetector CT imaging of the abdomen and pelvis was performed using the standard protocol following bolus administration of intravenous contrast. CONTRAST:  100  cc Isovue-300 COMPARISON:  CT abdomen and pelvis August 16, 2016 FINDINGS: LOWER CHEST: Stable 2 mm and 4 mm RIGHT middle lobe pulmonary nodules. Heart size is normal. Mild coronary artery calcifications. No pericardial effusions. HEPATOBILIARY: Cirrhotic liver with LEFT lobe and caudate hypertrophy. No intrahepatic masses or biliary dilatation. Status post cholecystectomy. PANCREAS: A trophic, otherwise unremarkable. SPLEEN: Normal. ADRENALS/URINARY TRACT: Kidneys are orthotopic, demonstrating symmetric enhancement. Two RIGHT nephrolithiasis measure up to 6 mm in RIGHT lower pole. No hydronephrosis or solid renal masses. The unopacified ureters are normal in course and caliber. Delayed imaging through the kidneys demonstrates symmetric prompt contrast excretion within the proximal urinary collecting system. Urinary bladder is partially distended and unremarkable. Normal adrenal glands. STOMACH/BOWEL: The stomach, small and large bowel are normal in course and caliber without inflammatory changes. Moderate sigmoid diverticulosis. Moderate amount of retained large bowel stool. VASCULAR/LYMPHATIC: Unchanged 3.1 cm infrarenal aortic aneurysm. Aorta with mild calcific atherosclerosis. Small  nonocclusive thrombus in confluence of splenic vein and portal vein is unchanged without propagation. Similar mild retroperitoneal mesenteric lymphadenopathy, likely reactive. There sees in LEFT upper quadrant. REPRODUCTIVE: Normal. OTHER: Similar moderate ascites.  Mesenteric edema is similar. MUSCULOSKELETAL: Nonacute. Small fat containing RIGHT inguinal hernia. Ascites within LEFT inguinal canal. Scattered Schmorl's nodes. IMPRESSION: Stable examination, cirrhosis and sequelae of portal hypertension with moderate ascites. 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 Similar lymphadenopathy is likely reactive. Nonobstructing RIGHT nephrolithiasis. Stable 3.1 cm infrarenal aortic aneurysm. Recommend followup by ultrasound in 3 years. This recommendation follows ACR consensus guidelines: White Paper of the ACR Incidental Findings Committee II on Vascular Findings. J Am Coll Radiol 2013; 10:789-794 Electronically Signed   By: Elon Alas M.D.   On: 09/13/2016 05:06   Ct Abdomen Pelvis W Contrast  Result Date: 08/16/2016 CLINICAL DATA:  Upper left and lower abdominal pain for 3 days. EXAM: CT ABDOMEN AND PELVIS WITH CONTRAST TECHNIQUE: Multidetector CT imaging of the abdomen and pelvis was performed using the standard protocol following bolus administration of intravenous contrast. CONTRAST:  100 mL of Isovue 370 COMPARISON:  June 20, 2016 FINDINGS: Lower chest: A 5.5 mm nodule in the anterior right lung the on series 205, image 6 is stable. No other abnormalities are seen within the lung bases. Hepatobiliary: A nodular contour to the liver is again identified, consistent with cirrhosis. Low-attenuation near the hepatic dome on image 14 is probably a tiny cyst. No other hepatic masses are seen. The patient is status post cholecystectomy. Thrombus in the portal vein near the confluence with the splenic and superior mesenteric veins on image 38 is nonocclusive and slightly smaller in the interval. This is  not an acute finding. Pancreas: Unremarkable. No pancreatic ductal dilatation or surrounding inflammatory changes. Spleen: Splenomegaly is unchanged with no splenic masses. Adrenals/Urinary Tract: Nonobstructive renal stones are again identified with no ureterectasis or ureteral stones. No suspicious renal masses. Stomach/Bowel: Evaluation of the bowel is limited without oral contrast. There is a small duodenal diverticulum of no acute consequence. The stomach and small bowel are otherwise normal. Colonic diverticuli are seen without diverticulitis. The colon and visualized portions of the appendix are otherwise normal. Vascular/Lymphatic: Atherosclerotic changes seen in the abdominal aorta, extending into the iliac vessels. Adenopathy in the mesenteries and retroperitoneum is stable. Reproductive: Uterus and bilateral adnexa are unremarkable. Other: No other abnormalities identified.  Marked ascites. Musculoskeletal: No change in the bones. IMPRESSION: 1. Cirrhosis, splenomegaly, and ascites. 2. Thrombus in the portal vein at the confluence with the splenic and superior  mesenteric veins continues but is smaller in the interval. This is not an acute finding. 3. Adenopathy in the abdomen may be reactive, secondary to cirrhosis. Recommend attention on follow-up. 4. Atherosclerotic change in the aorta and iliac vessels. Electronically Signed   By: Dorise Bullion III M.D   On: 08/16/2016 17:43   US Paracentesis  Result Date: 08/18/2016 INDICATION: Abdominal distention secondary to ascites. Request diagnostic and therapeutic paracentesis. EXAM: ULTRASOUND GUIDED RIGHT LOWER QUADRANT PARACENTESIS MEDICATIONS: None. COMPLICATIONS: None immediate. PROCEDURE: Informed written consent was obtained from the patient after a discussion of the risks, benefits and alternatives to treatment. A timeout was performed prior to the initiation of the procedure. Initial ultrasound scanning demonstrates a large amount of ascites  within the right lower abdominal quadrant. The right lower abdomen was prepped and draped in the usual sterile fashion. 1% lidocaine with epinephrine was used for local anesthesia. Following this, a 19 gauge, 10-cm, Yueh catheter was introduced. An ultrasound image was saved for documentation purposes. The paracentesis was performed. The catheter was removed and a dressing was applied. The patient tolerated the procedure well without immediate post procedural complication. FINDINGS: A total of approximately 2 L of clear yellow fluid was removed. Samples were sent to the laboratory as requested by the clinical team. IMPRESSION: Successful ultrasound-guided paracentesis yielding 2 liters of peritoneal fluid. Read by: Ascencion Dike PA-C Electronically Signed   By: Corrie Mckusick D.O.   On: 08/18/2016 13:34    Labs:  CBC:  Recent Labs  08/17/16 0251 08/18/16 0315 08/19/16 0556 08/24/16 1500 09/13/16 0012  WBC 3.5* 3.1* 2.8*  --  5.7  HGB 10.0* 10.0* 10.1* 9.7* 11.1*  HCT 30.7* 30.6* 31.0*  --  33.0*  PLT 46* 50* 52*  --  61*    COAGS:  Recent Labs  02/07/16 0704 02/10/16 1418 02/17/16 0158 06/20/16 1820 08/16/16 1459  INR 1.43 1.39 1.57* 1.31 1.35  APTT 30  --   --   --  35    BMP:  Recent Labs  08/16/16 1449 08/17/16 0251 08/18/16 0315 09/13/16 0012  NA 136 136 136 136  K 4.2 4.0 4.4 4.3  CL 104 108 107 105  CO2 24 25 25 26   GLUCOSE 101* 165* 136* 90  BUN 15 15 10 7   CALCIUM 9.1 8.2* 8.1* 8.7*  CREATININE 1.08 1.12 0.92 0.92  GFRNONAA >60 >60 >60 >60  GFRAA >60 >60 >60 >60    LIVER FUNCTION TESTS:  Recent Labs  06/22/16 0224 07/04/16 0250 08/16/16 1449 08/18/16 0315 08/18/16 1518  BILITOT 1.7* 1.6* 3.2* 2.3*  --   AST 55* 62* 73* 68*  --   ALT 45 47 56 48  --   ALKPHOS 84 90 86 72  --   PROT 6.1* 7.1 7.3 6.1*  --   ALBUMIN 2.6* 3.2* 3.1* 2.5* 2.5*    TUMOR MARKERS: No results for input(s): AFPTM, CEA, CA199, CHROMGRNA in the last 8760  hours.  Assessment and Plan:  Vincent Ochoa is a 60 y.o. male with past medical history significant for CAD, chronic back pain, morbid obesity, obstructive sleep apnea, paroxysmal atrial fibrillation, psoriasis, stroke, thrombocytopenia, cirrhosis and ascites who underwent a technically successful BRTO procedure on 02/05/2016.   Most recent dndoscopy performed on 08/17/2016 demonstrated gastritis and esophageal varices without signs of active bleeding (note, there was no mention of gastric varices).   The patient states he experiences at least 4-5 loose stools per day, at least 1-2 of which  are bloody.  For this, the patient states he is getting the undergoing a colonoscopy early next month by Dr. Michail Sermon.  CT scan of abdomen and pelvis performed 09/13/2016 (yesterday), 08/18/2016 as well as the preprocedural CT scan of the abdomen and pelvis performed 02/06/2016 were reviewed with the patient - images demonstrate a technically excellent result with complete sclerosant of previously identified large gastric varices.   The patient continues to complain of lower abdominal/back pain and was noted to have a trace / small amount of intra-abdominal ascites on CT performed yesterday - given small amount of intra-abdominal ascites and as the patient's lower abdominal and back pain did not improve following his most recent paracentesis, I do not feel ultrasound-guided paracentesis warranted at this time.   While no additional intervention (TIPS) is warranted at this time, given the life-threatening nature of his upper GI bleed earlier this year, current recommendations are to undergo staggered every 6 month endoscopies (last performed on 08/17/2016) and BRTO protocol CTs for the next 2 years.   As such, I will obtain a surveillance BRTO in 6 months (April 2018) and have the patient will return to the interventional radiology clinic for follow-up consultation following the acquisition of this surveillance  scan.  The patient demonstrated excellent understanding of the above discussion and recommendations and was encouraged to call the interventional radiology clinic with any interval questions or concerns.   A copy of this report was sent to the requesting provider on this date.  Electronically Signed: Sandi Mariscal 09/14/2016, 11:12 AM   I spent a total of 15 Minutes in face to face in clinical consultation, greater than 50% of which was counseling/coordinating care for Post BRTO management

## 2016-09-15 ENCOUNTER — Ambulatory Visit (HOSPITAL_BASED_OUTPATIENT_CLINIC_OR_DEPARTMENT_OTHER): Payer: Self-pay | Admitting: Hematology and Oncology

## 2016-09-15 ENCOUNTER — Telehealth: Payer: Self-pay | Admitting: Hematology and Oncology

## 2016-09-15 ENCOUNTER — Encounter: Payer: Self-pay | Admitting: Family Medicine

## 2016-09-15 ENCOUNTER — Other Ambulatory Visit (HOSPITAL_BASED_OUTPATIENT_CLINIC_OR_DEPARTMENT_OTHER): Payer: Self-pay

## 2016-09-15 ENCOUNTER — Encounter: Payer: Self-pay | Admitting: Hematology and Oncology

## 2016-09-15 ENCOUNTER — Ambulatory Visit (INDEPENDENT_AMBULATORY_CARE_PROVIDER_SITE_OTHER): Payer: Medicaid Other | Admitting: Family Medicine

## 2016-09-15 VITALS — BP 134/53 | HR 76 | Temp 98.2°F | Ht 75.0 in | Wt 282.6 lb

## 2016-09-15 DIAGNOSIS — I214 Non-ST elevation (NSTEMI) myocardial infarction: Secondary | ICD-10-CM

## 2016-09-15 DIAGNOSIS — B9789 Other viral agents as the cause of diseases classified elsewhere: Secondary | ICD-10-CM

## 2016-09-15 DIAGNOSIS — I8500 Esophageal varices without bleeding: Secondary | ICD-10-CM | POA: Insufficient documentation

## 2016-09-15 DIAGNOSIS — D693 Immune thrombocytopenic purpura: Secondary | ICD-10-CM | POA: Insufficient documentation

## 2016-09-15 DIAGNOSIS — N200 Calculus of kidney: Secondary | ICD-10-CM

## 2016-09-15 DIAGNOSIS — L409 Psoriasis, unspecified: Secondary | ICD-10-CM

## 2016-09-15 DIAGNOSIS — D696 Thrombocytopenia, unspecified: Secondary | ICD-10-CM

## 2016-09-15 DIAGNOSIS — I8511 Secondary esophageal varices with bleeding: Secondary | ICD-10-CM

## 2016-09-15 DIAGNOSIS — J040 Acute laryngitis: Secondary | ICD-10-CM

## 2016-09-15 LAB — CBC WITH DIFFERENTIAL/PLATELET
BASO%: 0.3 % (ref 0.0–2.0)
Basophils Absolute: 0 10*3/uL (ref 0.0–0.1)
EOS%: 1.9 % (ref 0.0–7.0)
Eosinophils Absolute: 0.1 10*3/uL (ref 0.0–0.5)
HEMATOCRIT: 32.4 % — AB (ref 38.4–49.9)
HEMOGLOBIN: 11.1 g/dL — AB (ref 13.0–17.1)
LYMPH#: 0.6 10*3/uL — AB (ref 0.9–3.3)
LYMPH%: 16.6 % (ref 14.0–49.0)
MCH: 31.4 pg (ref 27.2–33.4)
MCHC: 34.3 g/dL (ref 32.0–36.0)
MCV: 91.8 fL (ref 79.3–98.0)
MONO#: 0.5 10*3/uL (ref 0.1–0.9)
MONO%: 14.1 % — ABNORMAL HIGH (ref 0.0–14.0)
NEUT#: 2.4 10*3/uL (ref 1.5–6.5)
NEUT%: 67.1 % (ref 39.0–75.0)
Platelets: 44 10*3/uL — ABNORMAL LOW (ref 140–400)
RBC: 3.53 10*6/uL — ABNORMAL LOW (ref 4.20–5.82)
RDW: 15.8 % — AB (ref 11.0–14.6)
WBC: 3.6 10*3/uL — ABNORMAL LOW (ref 4.0–10.3)

## 2016-09-15 LAB — POCT URINALYSIS DIPSTICK
GLUCOSE UA: NEGATIVE
KETONES UA: NEGATIVE
LEUKOCYTES UA: NEGATIVE
NITRITE UA: NEGATIVE
Protein, UA: 30
Spec Grav, UA: 1.02
UROBILINOGEN UA: 4
pH, UA: 7

## 2016-09-15 LAB — POCT UA - MICROSCOPIC ONLY: RBC, urine, microscopic: >20

## 2016-09-15 MED ORDER — OXYCODONE-ACETAMINOPHEN 5-325 MG PO TABS: 1.0000 | ORAL_TABLET | ORAL | 0 refills | Status: DC | PRN

## 2016-09-15 MED ORDER — ELTROMBOPAG OLAMINE 50 MG PO TABS: 50.0000 mg | ORAL_TABLET | Freq: Every day | ORAL | 11 refills | Status: DC

## 2016-09-15 NOTE — Assessment & Plan Note (Signed)
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 He has started to have frequent bleeding complication related to varices/cirrhosis. The patient has been tried on IVIG and prednisone without beneficial bumping platelet count. We discussed the risk and benefit of Promacta and he agreed to proceed. The goal is to maintain platelet count greater than 50,000 on a regular basis I will get insurance prior authorization and see him back in the months to assess response to treatment

## 2016-09-15 NOTE — Telephone Encounter (Signed)
AVS report and appointment schedule, given to patient, per 09/15/16 los.

## 2016-09-15 NOTE — Assessment & Plan Note (Signed)
Reviewed CT abdomen from emergency department visit Noted nonobstructing right nephrolithiasis Patient also has history of kidney stones Refilled Percocet for pain control Return precautions such as worsening pain, decreased urination discussed with patient

## 2016-09-15 NOTE — Progress Notes (Signed)
[facility] OFFICE PROGRESS NOTE  Ralene Ok, MD SUMMARY OF HEMATOLOGIC HISTORY:  Vincent Ochoa had history of cardiac disease was admitted via Emergency Department for recurrent chest pain.He was initially seen in consultation on 12/8 for thrombocytopenia, felt to be likely autoimmune (related to psoriasis and liver disease), responding well to 2 doses of IVIG in addition to prednisone and transfusions. Patient had been discharged on 12/11 home with baby aspirin. He was subsequently readmitted to the hospital again on 11/03/2014 with recurrent chest pain and subsequently underwent cardiac catheterization and placement of bare metal stent on 11/04/2014. On 12/31/2014, he discontinued prednisone Throughout 2017, he had recurrent admission to the hospital due to chest pain, acute GI bleed, encephalopathy, bleeding gastric varices, alcoholic cirrhosis of the liver and respiratory failure. He received embolization of bleeding vessels and recurrent episodes of paracentesis for abdominal ascites INTERVAL HISTORY: Vincent Ochoa 60 y.o. male returns for further follow-up. He feels well. He denies recent infection. He has intermittent GI bleed in the past but none recently. His psoriasis does not bother him. He has quit smoking. Denies recent chest pain or shortness of breath.  I have reviewed the past medical history, past surgical history, social history and family history with the patient and they are unchanged from previous note.  ALLERGIES:  is allergic to asa [aspirin]; eliquis [apixaban]; nsaids; plavix [clopidogrel]; and statins.  MEDICATIONS:  Current Outpatient Prescriptions  Medication Sig Dispense Refill  . atorvastatin (LIPITOR) 40 MG tablet Take 1 tablet (40 mg total) by mouth daily at 8 pm. 30 tablet 0  . buPROPion (WELLBUTRIN XL) 150 MG 24 hr tablet Take 1 tablet (150 mg total) by mouth daily. 30 tablet 3  . carvedilol (COREG) 3.125 MG tablet Take 1 tablet  (3.125 mg total) by mouth 2 (two) times daily with a meal. 60 tablet 0  . isosorbide mononitrate (IMDUR) 30 MG 24 hr tablet Take 30 mg by mouth daily.    Marland Kitchen lactulose (CHRONULAC) 10 GM/15ML solution Take 15 mLs (10 g total) by mouth 3 (three) times daily. 240 mL 0  . nitroGLYCERIN (NITROSTAT) 0.4 MG SL tablet Place 1 tablet (0.4 mg total) under the tongue every 5 (five) minutes as needed for chest pain (up to 3 doses). 25 tablet 3  . oxyCODONE-acetaminophen (PERCOCET/ROXICET) 5-325 MG tablet Take 1-2 tablets by mouth every 4 (four) hours as needed for severe pain. 30 tablet 0  . pantoprazole (PROTONIX) 40 MG tablet Take 1 tablet (40 mg total) by mouth 2 (two) times daily. 60 tablet 2  . sucralfate (CARAFATE) 1 g tablet Take 1 tablet (1 g total) by mouth 4 (four) times daily -  with meals and at bedtime. Dissolve in water. 120 tablet 2  . eltrombopag (PROMACTA) 50 MG tablet Take 1 tablet (50 mg total) by mouth daily. Take on an empty stomach 1 hour before a meal or 2 hours after 30 tablet 11   No current facility-administered medications for this visit.      REVIEW OF SYSTEMS:   Constitutional: Denies fevers, chills or night sweats Eyes: Denies blurriness of vision Ears, nose, mouth, throat, and face: Denies mucositis or sore throat Respiratory: Denies cough, dyspnea or wheezes Cardiovascular: Denies palpitation, chest discomfort or lower extremity swelling Gastrointestinal:  Denies nausea, heartburn or change in bowel habits Skin: Denies abnormal skin rashes Lymphatics: Denies new lymphadenopathy or easy bruising Neurological:Denies numbness, tingling or new weaknesses Behavioral/Psych: Mood is stable, no new changes  All other systems  were reviewed with the patient and are negative.  PHYSICAL EXAMINATION: ECOG PERFORMANCE STATUS: 1 - Symptomatic but completely ambulatory  Vitals:   09/15/16 1154  BP: 122/77  Pulse: 82  Resp: 18  Temp: 98.2 F (36.8 C)   Filed Weights   09/15/16  1154  Weight: 281 lb 1.6 oz (127.5 kg)    GENERAL:alert, no distress and comfortable SKIN: skin color, texture, turgor are normal, no rashes or significant lesions EYES: normal, Conjunctiva are pink and non-injected, sclera clear OROPHARYNX:no exudate, no erythema and lips, buccal mucosa, and tongue normal  NECK: supple, thyroid normal size, non-tender, without nodularity LYMPH:  no palpable lymphadenopathy in the cervical, axillary or inguinal LUNGS: clear to auscultation and percussion with normal breathing effort HEART: regular rate & rhythm and no murmurs and no lower extremity edema ABDOMEN:abdomen soft, non-tender and normal bowel sounds Musculoskeletal:no cyanosis of digits and no clubbing  NEURO: alert & oriented x 3 with fluent speech, no focal motor/sensory deficits  LABORATORY DATA:  I have reviewed the data as listed     Component Value Date/Time   NA 136 09/13/2016 0012   K 4.3 09/13/2016 0012   CL 105 09/13/2016 0012   CO2 26 09/13/2016 0012   GLUCOSE 90 09/13/2016 0012   BUN 7 09/13/2016 0012   CREATININE 0.92 09/13/2016 0012   CREATININE 1.26 (H) 03/03/2016 1102   CALCIUM 8.7 (L) 09/13/2016 0012   PROT 6.1 (L) 08/18/2016 0315   ALBUMIN 2.5 (L) 08/18/2016 1518   AST 68 (H) 08/18/2016 0315   ALT 48 08/18/2016 0315   ALKPHOS 72 08/18/2016 0315   BILITOT 2.3 (H) 08/18/2016 0315   GFRNONAA >60 09/13/2016 0012   GFRNONAA 62 03/03/2016 1102   GFRAA >60 09/13/2016 0012   GFRAA 71 03/03/2016 1102    No results found for: SPEP, UPEP  Lab Results  Component Value Date   WBC 3.6 (L) 09/15/2016   NEUTROABS 2.4 09/15/2016   HGB 11.1 (L) 09/15/2016   HCT 32.4 (L) 09/15/2016   MCV 91.8 09/15/2016   PLT 44 (L) 09/15/2016      Chemistry      Component Value Date/Time   NA 136 09/13/2016 0012   K 4.3 09/13/2016 0012   CL 105 09/13/2016 0012   CO2 26 09/13/2016 0012   BUN 7 09/13/2016 0012   CREATININE 0.92 09/13/2016 0012   CREATININE 1.26 (H) 03/03/2016  1102      Component Value Date/Time   CALCIUM 8.7 (L) 09/13/2016 0012   ALKPHOS 72 08/18/2016 0315   AST 68 (H) 08/18/2016 0315   ALT 48 08/18/2016 0315   BILITOT 2.3 (H) 08/18/2016 0315      ASSESSMENT & PLAN:  Chronic ITP (idiopathic thrombocytopenia) (HCC) He has multifactorial thrombocytopenia related to chronic ITP, liver cirrhosis and psoriasis He will continue antiplatelet agents as long as his platelet count remained above 30,000. He has started to have frequent bleeding complication related to varices/cirrhosis. The patient has been tried on IVIG and prednisone without beneficial bumping platelet count. We discussed the risk and benefit of Promacta and he agreed to proceed. The goal is to maintain platelet count greater than 50,000 on a regular basis I will get insurance prior authorization and see him back in the months to assess response to treatment  NSTEMI (non-ST elevated myocardial infarction) He has significant improvement of symptoms since his last saw his cardiologist. Recent heart catheterization did not show any significant disease warranting further intervention. He will continue medical  management   Psoriasis The psoriasis is stable but does not bother him. Monitor only  Varices, esophageal ([place]) The patient had intermittent variceal bleeding. He is under close observation/surveillance by GI. The patient have intermittent low platelet count likely due to consumption. As above, I will start him on treatment to bring up the platelet count and hopefully reduce his risk of bleeding   Orders Placed This Encounter  Procedures  . CBC with Differential/Platelet    Standing Status:   Future    Standing Expiration Date:   10/20/2017  . Comprehensive metabolic panel    Standing Status:   Future    Standing Expiration Date:   10/20/2017  . Vitamin B12    Standing Status:   Future    Standing Expiration Date:   10/20/2017    All questions were answered. The  patient knows to call the clinic with any problems, questions or concerns. No barriers to learning was detected.  I spent 20 minutes counseling the patient face to face. The total time spent in the appointment was 30 minutes and more than 50% was on counseling.     Heath Lark, MD 10/26/20173:36 PM

## 2016-09-15 NOTE — Progress Notes (Signed)
   Subjective:   Vincent Ochoa is a 60 y.o. male with a history of CAD, HTN, and STEMI, GERD, alcoholic cirrhosis here for back pain  Saw GI about abd pain and was diagnosed with constipation and hemorrhoid  Low back pain - across whole low back - present for ~3 days - hurts to sit, lay down, stand up, move around - better with laying in fetal position - seems to radiate to flank and groin b/l - no leg pain, numbness/tingling in LEs, weakness at baseline - "[place]" in 1989 in [place] - no surgery, wore "turtle shell" - was prescribed percocet by ED - used 7 of 8 pills - urine is stinky and red - denies fevers, urinary/stool incontinence  Also complains of ~3 weeks of hoarse voice following URI with cough  Review of Systems:  Per HPI.   Social History: former smoker  Objective:  BP (!) 134/53   Pulse 76   Temp 98.2 F (36.8 C) (Oral)   Ht 6\' 3"  (1.905 m)   Wt 282 lb 9.6 oz (128.2 kg)   BMI 35.32 kg/m   Gen:  60 y.o. male in NAD HEENT: NCAT, MMM, EOMI, PERRL, anicteric sclerae, OP clear Neck: Shotty LAD CV: RRR, no MRG Resp: Non-labored, CTAB, no wheezes noted Abd: Soft, mildly, diffusely TTP, ND, BS present, no guarding or organomegaly, + fluid wave, +CVA tenderness Ext: WWP, no edema MSK: No tenderness to palpation along spinal column, negative straight leg raise bilaterally Neuro: Alert and oriented, speech normal      Chemistry      Component Value Date/Time   NA 136 09/13/2016 0012   K 4.3 09/13/2016 0012   CL 105 09/13/2016 0012   CO2 26 09/13/2016 0012   BUN 7 09/13/2016 0012   CREATININE 0.92 09/13/2016 0012   CREATININE 1.26 (H) 03/03/2016 1102      Component Value Date/Time   CALCIUM 8.7 (L) 09/13/2016 0012   ALKPHOS 72 08/18/2016 0315   AST 68 (H) 08/18/2016 0315   ALT 48 08/18/2016 0315   BILITOT 2.3 (H) 08/18/2016 0315      Lab Results  Component Value Date   WBC 5.7 09/13/2016   HGB 11.1 (L) 09/13/2016   HCT 33.0 (L) 09/13/2016   MCV 90.7 09/13/2016   PLT 61 (L) 09/13/2016   Lab Results  Component Value Date   TSH 1.43 03/12/2015   Lab Results  Component Value Date   HGBA1C 5.6 10/28/2014   Assessment & Plan:     Vincent Ochoa is a 60 y.o. male here for   Viral laryngitis Reassured patient Conservative management  Nephrolithiasis Reviewed CT abdomen from emergency department visit Noted nonobstructing right nephrolithiasis Patient also has history of kidney stones Refilled Percocet for pain control Return precautions such as worsening pain, decreased urination discussed with patient      Virginia Crews, MD MPH PGY-3,  [facility] 09/15/2016  10:11 AM

## 2016-09-15 NOTE — Assessment & Plan Note (Signed)
The psoriasis is stable but does not bother him. Monitor only

## 2016-09-15 NOTE — Assessment & Plan Note (Signed)
The patient had intermittent variceal bleeding. He is under close observation/surveillance by GI. The patient have intermittent low platelet count likely due to consumption. As above, I will start him on treatment to bring up the platelet count and hopefully reduce his risk of bleeding

## 2016-09-15 NOTE — Patient Instructions (Signed)
Eltrombopag tablets What is this medicine? ELTROMBOPAG (el TROM boe pag) helps your body make more platelets. It is used to treat low platelets caused by chronic immune (idiopathic) thrombocytopenic purpura (ITP) or chronic hepatitis C infection. It is also used in patients with severe aplastic anemia when other medicines have not worked well enough. This medicine may be used for other purposes; ask your health care provider or pharmacist if you have questions. What should I tell my health care provider before I take this medicine? They need to know if you have any of these conditions: -cancer -history of blood clots -eye disease, vision problems -kidney disease -liver disease -low blood counts, like low white cell, platelet, or red cell counts -have had your spleen removed -an unusual or allergic reaction to Eltrombopag, other medicines, foods, dyes, or preservatives -pregnant or trying to get pregnant -breast-feeding How should I use this medicine? Take this medicine by mouth with a glass of water. Follow the directions on the prescription label. Take this medicine on an empty stomach, at least 1 hour before or 2 hours after food. Do not take with food. Avoid antacids, aluminum, calcium, iron, magnesium, selenium, and zinc products for 2 hours before and 4 hours after taking your dose. Take your medicine at regular intervals. Do not take it more often than directed. Do not stop taking except on your doctor's advice. A special MedGuide will be given to you by the pharmacist with each prescription and refill. Be sure to read this information carefully each time. Talk to your pediatrician regarding the use of this medicine in children. While this drug may be prescribed for children as young as 1 year for selected conditions, precautions do apply. Overdosage: If you think you have taken too much of this medicine contact a poison control center or emergency room at once. NOTE: This medicine is only  for you. Do not share this medicine with others. What if I miss a dose? If you miss a dose, wait and take your next scheduled dose. Do not take more than 1 dose in 1 day. If it is almost time for your next dose, take only that dose. Do not take double or extra doses. What may interact with this medicine? -antacids -bosentan -calcium supplements -certain medicines for cholesterol like atorvastatin, fluvastatin, pravastatin, rosuvastatin -certain medicines that treat or prevent blood clots like warfarin, enoxaparin, dalteparin, apixaban, dabigatran, and rivaroxaban -ezetimibe -glyburide -imatinib -irinotecan -iron supplements -lapatinib -magnesium supplements -methotrexate -mitoxantrone -multivitamins with minerals -NSAIDS, medicines for pain and inflammation, like ibuprofen or naproxen -olmesartan -omeprazole -repaglinide -rifampin -selenium -sulfasalazine -topotecan -valsartan -zinc This list may not describe all possible interactions. Give your health care provider a list of all the medicines, herbs, non-prescription drugs, or dietary supplements you use. Also tell them if you smoke, drink alcohol, or use illegal drugs. Some items may interact with your medicine. What should I watch for while using this medicine? Your condition will be monitored carefully while you are receiving this medicine. To receive this medicine, you, your doctor and your pharmacy must be registered in the [facility] program. Visit your prescriber or health care professional for regular checks on your progress and for the needed blood tests. It is important to keep all appointments. Tell your doctor or health care professional right away if you have any change in your eyesight. What side effects may I notice from receiving this medicine? Side effects that you should report to your doctor or health care professional as soon as  possible: -allergic reactions like skin rash, itching or hives, swelling of  the face, lips, or tongue -changes in vision -dark urine -general ill feeling or flu-like symptoms -light-colored stools -loss of appetite -right upper belly pain -signs and symptoms of a blood clot such as breathing problems; chest pain; severe, sudden headache; pain, swelling, warmth in the leg; trouble speaking; sudden numbness or weakness of the face, arm or leg -shortness of breath, chest pain, swelling in a leg -unusual bleeding or bruising -unusually weak or tired -yellowing of the eyes or skin Side effects that usually do not require medical attention (report to your doctor or health care professional if they continue or are bothersome): -cough -diarrhea -dry mouth -headache -muscle aches -nausea This list may not describe all possible side effects. Call your doctor for medical advice about side effects. You may report side effects to FDA at 1-800-FDA-1088. Where should I keep my medicine? Keep out of the reach of children. Store at room temperature between 15 and 30 degrees C (59 and 86 degrees F). Throw away any unused medicine after the expiration date. NOTE: This sheet is a summary. It may not cover all possible information. If you have questions about this medicine, talk to your doctor, pharmacist, or health care provider.    2016, Elsevier/Gold Standard. (2014-07-21 11:13:20)

## 2016-09-15 NOTE — Assessment & Plan Note (Signed)
Reassured patient Conservative management

## 2016-09-15 NOTE — Assessment & Plan Note (Signed)
He has significant improvement of symptoms since his last saw his cardiologist. Recent heart catheterization did not show any significant disease warranting further intervention. He will continue medical management

## 2016-09-15 NOTE — Patient Instructions (Signed)
Kidney Stones °Kidney stones (urolithiasis) are deposits that form inside your kidneys. The intense pain is caused by the stone moving through the urinary tract. When the stone moves, the ureter goes into spasm around the stone. The stone is usually passed in the urine.  °CAUSES  °· A disorder that makes certain neck glands produce too much parathyroid hormone (primary hyperparathyroidism). °· A buildup of uric acid crystals, similar to gout in your joints. °· Narrowing (stricture) of the ureter. °· A kidney obstruction present at birth (congenital obstruction). °· Previous surgery on the kidney or ureters. °· Numerous kidney infections. °SYMPTOMS  °· Feeling sick to your stomach (nauseous). °· Throwing up (vomiting). °· Blood in the urine (hematuria). °· Pain that usually spreads (radiates) to the groin. °· Frequency or urgency of urination. °DIAGNOSIS  °· Taking a history and physical exam. °· Blood or urine tests. °· CT scan. °· Occasionally, an examination of the inside of the urinary bladder (cystoscopy) is performed. °TREATMENT  °· Observation. °· Increasing your fluid intake. °· Extracorporeal shock wave lithotripsy--This is a noninvasive procedure that uses shock waves to break up kidney stones. °· Surgery may be needed if you have severe pain or persistent obstruction. There are various surgical procedures. Most of the procedures are performed with the use of small instruments. Only small incisions are needed to accommodate these instruments, so recovery time is minimized. °The size, location, and chemical composition are all important variables that will determine the proper choice of action for you. Talk to your health care provider to better understand your situation so that you will minimize the risk of injury to yourself and your kidney.  °HOME CARE INSTRUCTIONS  °· Drink enough water and fluids to keep your urine clear or pale yellow. This will help you to pass the stone or stone fragments. °· Strain  all urine through the provided strainer. Keep all particulate matter and stones for your health care provider to see. The stone causing the pain may be as small as a grain of salt. It is very important to use the strainer each and every time you pass your urine. The collection of your stone will allow your health care provider to analyze it and verify that a stone has actually passed. The stone analysis will often identify what you can do to reduce the incidence of recurrences. °· Only take over-the-counter or prescription medicines for pain, discomfort, or fever as directed by your health care provider. °· Keep all follow-up visits as told by your health care provider. This is important. °· Get follow-up X-rays if required. The absence of pain does not always mean that the stone has passed. It may have only stopped moving. If the urine remains completely obstructed, it can cause loss of kidney function or even complete destruction of the kidney. It is your responsibility to make sure X-rays and follow-ups are completed. Ultrasounds of the kidney can show blockages and the status of the kidney. Ultrasounds are not associated with any radiation and can be performed easily in a matter of minutes. °· Make changes to your daily diet as told by your health care provider. You may be told to: °¨ Limit the amount of salt that you eat. °¨ Eat 5 or more servings of fruits and vegetables each day. °¨ Limit the amount of meat, poultry, fish, and eggs that you eat. °· Collect a 24-hour urine sample as told by your health care provider. You may need to collect another urine sample every 6-12   months. °SEEK MEDICAL CARE IF: °· You experience pain that is progressive and unresponsive to any pain medicine you have been prescribed. °SEEK IMMEDIATE MEDICAL CARE IF:  °· Pain cannot be controlled with the prescribed medicine. °· You have a fever or shaking chills. °· The severity or intensity of pain increases over 18 hours and is not  relieved by pain medicine. °· You develop a new onset of abdominal pain. °· You feel faint or pass out. °· You are unable to urinate. °  °This information is not intended to replace advice given to you by your health care provider. Make sure you discuss any questions you have with your health care provider. °  °Document Released: 11/07/2005 Document Revised: 07/29/2015 Document Reviewed: 04/10/2013 °Elsevier Interactive Patient Education ©2016 Elsevier Inc. ° °

## 2016-09-20 ENCOUNTER — Encounter: Payer: Self-pay | Admitting: Family Medicine

## 2016-09-20 ENCOUNTER — Encounter: Payer: Self-pay | Admitting: Pharmacist

## 2016-09-20 ENCOUNTER — Ambulatory Visit (INDEPENDENT_AMBULATORY_CARE_PROVIDER_SITE_OTHER): Payer: Self-pay | Admitting: Family Medicine

## 2016-09-20 VITALS — BP 135/71 | HR 87 | Temp 98.3°F | Ht 75.0 in | Wt 290.0 lb

## 2016-09-20 DIAGNOSIS — M545 Low back pain, unspecified: Secondary | ICD-10-CM

## 2016-09-20 DIAGNOSIS — M549 Dorsalgia, unspecified: Secondary | ICD-10-CM

## 2016-09-20 NOTE — Patient Instructions (Addendum)
It was a pleasure to see you today! I think this back pain is related to muscle tightness. Try Capsaicin cream over the counter for your back pain. Try to get the roll on application as this is easier for you to put on. Do not use the horse lineament anymore. For your constipation, you can try miralax as needed. Physical therapy will call you to make an appointment.

## 2016-09-20 NOTE — Progress Notes (Signed)
   CC: back pain  HPI  Back pain: started 1 month ago, no inciting injury, sharp, bilateral lumbar regions, radiates to RLQ. Constant. Tried pain medicine from [facility], helped some. Drinking lots of water (5 16 oz bottles per day). No fevers. Had kidney stones in the past, came out quickly with pain, this pain feels different than those. Used a whole jar of icy hot, this helped some. Heat in recliner and vibrating massage, this helps, sleeping 2 hours per night. Pink tinged urine, this is not new (long history of hematuria and current ITP with platelets to 44). Urinating every couple hours, at night as well. Constipated, feels this might be contributing to his back pain. Wanted to try miralax for this, but wasn't sure if he was allowed to based on other medicines. Small bowel incontinence 3 days ago when he thought he had to pass gas. No urinary incontinence. No new LE weakness.   CC, SH/smoking status, and VS noted  Objective: BP 135/71   Pulse 87   Temp 98.3 F (36.8 C) (Oral)   Ht 6\' 3"  (1.905 m)   Wt 290 lb (131.5 kg)   BMI 36.25 kg/m  Gen: NAD, alert, cooperative, and pleasant. HEENT: NCAT, EOMI, PERRL CV: RRR, no murmur Resp: CTAB, no wheezes, non-labored Abd: TTP over bilateral lumbosacral regions and over RLQ, BS present, no guarding or organomegaly. No fluid wave. Ext: No edema, warm Neuro: Alert and oriented, Speech clear, No gross deficits  Assessment and plan:  Back pain, lumbosacral Began 1 month ago, although this has been charted prior to now. Incidental finding of 52mm renal pole stone, given percocet last visit to [facility]. This helps some, but patient has not taken frequently because it keeps him from being able to drive. Has tried icy hot and horse lineament. On exam, MSK tenderness over bilateral lumbosacral regions, pain IMPROVED by CVA exam. No pain over spinous processes. Will prescribe Capsaicin cream and physical therapy. No alarm symptoms.   Constipation Counseled  patient that he is welcome to try Miralax PRN for constipation. Colonoscopy scheduled 09/26/16.  Orders Placed This Encounter  Procedures  . Ambulatory referral to Physical Therapy    Referral Priority:   Routine    Referral Type:   Physical Medicine    Referral Reason:   Specialty Services Required    Requested Specialty:   Physical Therapy    Number of Visits Requested:   1    Ralene Ok, MD, PGY1 09/20/2016 4:35 PM

## 2016-09-20 NOTE — Progress Notes (Signed)
Oral Chemotherapy Pharmacist Encounter  Received prescription for Promacta 50mg . 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 Initial dose recommended is 25mg  daily in this population. Discussed with MD, new RX for 25mg  obtained. Cmet from 9/28 and CBC from 10/24 reviewed, dose adjustments made.  Prescription will be sent to [facility] for benefits analysis.  Johny Drilling, PharmD, BCPS 09/20/2016  4:49 PM Oral Chemotherapy Clinic [phone]

## 2016-09-20 NOTE — Assessment & Plan Note (Addendum)
Began 1 month ago, although this has been charted prior to now. Incidental finding of 64mm renal pole stone, given percocet last visit to [facility]. This helps some, but patient has not taken frequently because it keeps him from being able to drive. Has tried icy hot and horse lineament. On exam, MSK tenderness over bilateral lumbosacral regions, pain IMPROVED by CVA exam. No pain over spinous processes. Will prescribe Capsaicin cream and physical therapy. No alarm symptoms.

## 2016-09-21 ENCOUNTER — Other Ambulatory Visit: Payer: Self-pay | Admitting: Hematology and Oncology

## 2016-09-21 MED ORDER — ELTROMBOPAG OLAMINE 25 MG PO TABS: 25.0000 mg | ORAL_TABLET | Freq: Every day | ORAL | 11 refills | Status: DC

## 2016-09-22 ENCOUNTER — Telehealth: Payer: Self-pay | Admitting: Pharmacist

## 2016-09-22 NOTE — Telephone Encounter (Signed)
Oral Chemotherapy Pharmacist Encounter  I spoke with patient about financial documents needed for [employer] patient assistance foundation enrollment form. Patient will bring in 3 months of bank statements to [facility] tomorrow (11/3) around lunchtime to complete his application.  Oral Chemo Clinic will continue to follow.  Johny Drilling, PharmD, BCPS 09/22/2016  11:58 AM Oral Chemotherapy Clinic [phone]

## 2016-09-22 NOTE — Telephone Encounter (Signed)
Oral Chemotherapy Pharmacist Encounter  Received notification from [facility] that patient does not have prescription drug coverage and we should try to identify manufacturer assistance for his Promacta.  I LVM for patient will offer to help find assistance and request for income information.  Oral Chemo Clinic will continue to follow.  Johny Drilling, PharmD, BCPS 09/22/2016  10:03 AM Oral Chemotherapy Clinic [phone]

## 2016-09-23 ENCOUNTER — Other Ambulatory Visit: Payer: Self-pay | Admitting: Gastroenterology

## 2016-09-23 ENCOUNTER — Encounter (HOSPITAL_COMMUNITY): Payer: Self-pay | Admitting: *Deleted

## 2016-09-23 DIAGNOSIS — D509 Iron deficiency anemia, unspecified: Secondary | ICD-10-CM

## 2016-09-23 NOTE — Progress Notes (Signed)
I did not reach patient for pre- procedure call. I left following  instructions on verified voice mail: arrive at 6:45 AM on Monday, November 6, free valet parking, register in admitting office, do not eat or drink after midnight, only take medications that GI MD instructed him to take, follow prep as instructed, ?'s or problems after 6;30 Am on Monday call [place].

## 2016-09-26 ENCOUNTER — Ambulatory Visit (HOSPITAL_COMMUNITY)
Admission: AD | Admit: 2016-09-26 | Discharge: 2016-09-26 | Disposition: A | Discharge status: Home / Self Care | Payer: Medicaid Other | Source: Ambulatory Visit | Attending: Gastroenterology | Admitting: Gastroenterology

## 2016-09-26 ENCOUNTER — Encounter (HOSPITAL_COMMUNITY): Payer: Self-pay | Admitting: *Deleted

## 2016-09-26 ENCOUNTER — Ambulatory Visit (HOSPITAL_COMMUNITY): Payer: Medicaid Other | Admitting: Certified Registered Nurse Anesthetist

## 2016-09-26 ENCOUNTER — Encounter (HOSPITAL_COMMUNITY)
Admission: AD | Disposition: A | Discharge status: Home / Self Care | Payer: Self-pay | Source: Ambulatory Visit | Attending: Gastroenterology

## 2016-09-26 DIAGNOSIS — I252 Old myocardial infarction: Secondary | ICD-10-CM | POA: Diagnosis not present

## 2016-09-26 DIAGNOSIS — Z6836 Body mass index (BMI) 36.0-36.9, adult: Secondary | ICD-10-CM | POA: Insufficient documentation

## 2016-09-26 DIAGNOSIS — K219 Gastro-esophageal reflux disease without esophagitis: Secondary | ICD-10-CM | POA: Diagnosis not present

## 2016-09-26 DIAGNOSIS — I2510 Atherosclerotic heart disease of native coronary artery without angina pectoris: Secondary | ICD-10-CM | POA: Diagnosis not present

## 2016-09-26 DIAGNOSIS — G4730 Sleep apnea, unspecified: Secondary | ICD-10-CM | POA: Insufficient documentation

## 2016-09-26 DIAGNOSIS — I4891 Unspecified atrial fibrillation: Secondary | ICD-10-CM | POA: Diagnosis not present

## 2016-09-26 DIAGNOSIS — K625 Hemorrhage of anus and rectum: Secondary | ICD-10-CM | POA: Diagnosis present

## 2016-09-26 DIAGNOSIS — D649 Anemia, unspecified: Secondary | ICD-10-CM | POA: Insufficient documentation

## 2016-09-26 DIAGNOSIS — F329 Major depressive disorder, single episode, unspecified: Secondary | ICD-10-CM | POA: Insufficient documentation

## 2016-09-26 DIAGNOSIS — K621 Rectal polyp: Secondary | ICD-10-CM | POA: Insufficient documentation

## 2016-09-26 DIAGNOSIS — Z1211 Encounter for screening for malignant neoplasm of colon: Secondary | ICD-10-CM | POA: Diagnosis present

## 2016-09-26 DIAGNOSIS — Z955 Presence of coronary angioplasty implant and graft: Secondary | ICD-10-CM | POA: Diagnosis not present

## 2016-09-26 DIAGNOSIS — K641 Second degree hemorrhoids: Secondary | ICD-10-CM | POA: Insufficient documentation

## 2016-09-26 DIAGNOSIS — M1990 Unspecified osteoarthritis, unspecified site: Secondary | ICD-10-CM | POA: Insufficient documentation

## 2016-09-26 DIAGNOSIS — Z79899 Other long term (current) drug therapy: Secondary | ICD-10-CM | POA: Diagnosis not present

## 2016-09-26 DIAGNOSIS — I10 Essential (primary) hypertension: Secondary | ICD-10-CM | POA: Diagnosis not present

## 2016-09-26 DIAGNOSIS — K644 Residual hemorrhoidal skin tags: Secondary | ICD-10-CM | POA: Diagnosis not present

## 2016-09-26 DIAGNOSIS — D696 Thrombocytopenia, unspecified: Secondary | ICD-10-CM | POA: Insufficient documentation

## 2016-09-26 DIAGNOSIS — K921 Melena: Secondary | ICD-10-CM | POA: Diagnosis not present

## 2016-09-26 DIAGNOSIS — F17200 Nicotine dependence, unspecified, uncomplicated: Secondary | ICD-10-CM | POA: Diagnosis not present

## 2016-09-26 HISTORY — PX: COLONOSCOPY WITH PROPOFOL: SHX5780

## 2016-09-26 SURGERY — COLONOSCOPY WITH PROPOFOL
Anesthesia: Monitor Anesthesia Care

## 2016-09-26 MED ORDER — SODIUM CHLORIDE 0.9 % IV SOLN: INTRAVENOUS | Status: DC

## 2016-09-26 MED ORDER — LIDOCAINE HCL (CARDIAC) 20 MG/ML IV SOLN
INTRAVENOUS | Status: DC | PRN
  Administered 2016-09-26: 50 mg via INTRATRACHEAL

## 2016-09-26 MED ORDER — LACTATED RINGERS IV SOLN
INTRAVENOUS | Status: DC
  Administered 2016-09-26 (×2): via INTRAVENOUS

## 2016-09-26 MED ORDER — PROPOFOL 500 MG/50ML IV EMUL
INTRAVENOUS | Status: DC | PRN
  Administered 2016-09-26: 60 ug/kg/min via INTRAVENOUS

## 2016-09-26 MED ORDER — PROPOFOL 10 MG/ML IV BOLUS
INTRAVENOUS | Status: DC | PRN
  Administered 2016-09-26: 20 mg via INTRAVENOUS

## 2016-09-26 NOTE — H&P (Signed)
Date of Initial H&P: 09/23/16  History reviewed, patient examined, no change in status, stable for surgery.

## 2016-09-26 NOTE — Discharge Instructions (Signed)

## 2016-09-26 NOTE — Anesthesia Preprocedure Evaluation (Addendum)
Anesthesia Evaluation  Patient identified by MRN, date of birth, ID band Patient awake    Reviewed: Allergy & Precautions, NPO status , Patient's Chart, lab work & pertinent test results  Airway Mallampati: I  TM Distance: >3 FB Neck ROM: Full    Dental  (+) Poor Dentition, Missing, Edentulous Upper   Pulmonary sleep apnea , Current Smoker, former smoker,    breath sounds clear to auscultation       Cardiovascular hypertension, + CAD, + Past MI and + Cardiac Stents ([facility]MS 2015, not on aspirin due to recurrent GI bleeds)  + dysrhythmias Atrial Fibrillation  Rhythm:Regular Rate:Normal     Neuro/Psych  Headaches, PSYCHIATRIC DISORDERS Depression CVA    GI/Hepatic GERD  ,(+) Cirrhosis   Esophageal Varices    , Fatty liver disease with varices   Endo/Other  negative endocrine ROS  Renal/GU negative Renal ROS     Musculoskeletal  (+) Arthritis ,   Abdominal (+) + obese,   Peds  Hematology  (+) anemia ,   Anesthesia Other Findings   Reproductive/Obstetrics                            Anesthesia Physical  Anesthesia Plan  ASA: IV  Anesthesia Plan: MAC   Post-op Pain Management:    Induction: Intravenous  Airway Management Planned: Nasal Cannula  Additional Equipment:   Intra-op Plan:   Post-operative Plan:   Informed Consent: I have reviewed the patients History and Physical, chart, labs and discussed the procedure including the risks, benefits and alternatives for the proposed anesthesia with the patient or authorized representative who has indicated his/her understanding and acceptance.   Dental advisory given  Plan Discussed with: CRNA, Anesthesiologist and Surgeon  Anesthesia Plan Comments:         Anesthesia Quick Evaluation

## 2016-09-26 NOTE — Anesthesia Postprocedure Evaluation (Signed)
Anesthesia Post Note  Patient: Vincent Ochoa  Procedure(s) Performed: Procedure(s) (LRB): COLONOSCOPY WITH PROPOFOL (N/A)  Patient location during evaluation: Endoscopy Anesthesia Type: MAC Level of consciousness: awake and alert Pain management: pain level controlled Vital Signs Assessment: post-procedure vital signs reviewed and stable Respiratory status: spontaneous breathing, nonlabored ventilation, respiratory function stable and patient connected to nasal cannula oxygen Cardiovascular status: stable and blood pressure returned to baseline Anesthetic complications: no    Last Vitals:  Vitals:   09/26/16 0910 09/26/16 0920  BP: 118/79 (!) 129/58  Pulse: 87 80  Resp: 15 11  Temp:      Last Pain:  Vitals:   09/26/16 0852  TempSrc: Oral                 Karsyn Jamie,JAMES TERRILL

## 2016-09-26 NOTE — Interval H&P Note (Signed)
History and Physical Interval Note:  09/26/2016 8:29 AM  Vincent Ochoa  has presented today for surgery, with the diagnosis of screening  The various methods of treatment have been discussed with the patient and family. After consideration of risks, benefits and other options for treatment, the patient has consented to  Procedure(s): COLONOSCOPY WITH PROPOFOL (N/A) as a surgical intervention .  The patient's history has been reviewed, patient examined, no change in status, stable for surgery.  I have reviewed the patient's chart and labs.  Questions were answered to the patient's satisfaction.     Kenner C.

## 2016-09-26 NOTE — Transfer of Care (Signed)
Immediate Anesthesia Transfer of Care Note  Patient: Vincent Ochoa  Procedure(s) Performed: Procedure(s): COLONOSCOPY WITH PROPOFOL (N/A)  Patient Location: PACU  Anesthesia Type:MAC  Level of Consciousness: awake and alert   Airway & Oxygen Therapy: Patient Spontanous Breathing  Post-op Assessment: Report given to RN, Post -op Vital signs reviewed and stable and Patient moving all extremities X 4  Post vital signs: Reviewed and stable  Last Vitals:  Vitals:   09/26/16 0710 09/26/16 0852  BP: (!) 155/83 136/81  Pulse: 87 88  Resp: 14 15  Temp: 36.9 C     Last Pain:  Vitals:   09/26/16 0852  TempSrc: Oral         Complications: No apparent anesthesia complications

## 2016-09-26 NOTE — Op Note (Signed)
[facility] Patient Name: Vincent Ochoa Procedure Date : 09/26/2016 MRN: [id number] Attending MD: Lear Ng , MD Date of Birth: 29-Sep-1956 CSN: [id number] Age: 60 Admit Type: Outpatient Procedure:                Colonoscopy Indications:              This is the patient's first colonoscopy,                            Hematochezia, Rectal bleeding Providers:                Lear Ng, MD, Kingsley Plan, RN, Ralene Bathe, Technician, Rejeana Brock, CRNA Referring MD:              Medicines:                Propofol per Anesthesia, Monitored Anesthesia Care Complications:            No immediate complications. Estimated Blood Loss:     Estimated blood loss was minimal. Estimated blood                            loss was minimal. Procedure:                Pre-Anesthesia Assessment:                           - Prior to the procedure, a History and Physical                            was performed, and patient medications and                            allergies were reviewed. The patient's tolerance of                            previous anesthesia was also reviewed. The risks                            and benefits of the procedure and the sedation                            options and risks were discussed with the patient.                            All questions were answered, and informed consent                            was obtained. Prior Anticoagulants: The patient has                            taken no previous anticoagulant or antiplatelet  agents. ASA Grade Assessment: IV - A patient with                            severe systemic disease that is a constant threat                            to life. After reviewing the risks and benefits,                            the patient was deemed in satisfactory condition to                            undergo the procedure.                           After  obtaining informed consent, the colonoscope                            was passed under direct vision. Throughout the                            procedure, the patient's blood pressure, pulse, and                            oxygen saturations were monitored continuously. The                            Colonoscope was introduced through the anus and                            advanced to the the cecum, identified by                            appendiceal orifice and ileocecal valve. The                            colonoscopy was somewhat difficult due to                            significant looping, a tortuous colon and fair                            prep. Successful completion of the procedure was                            aided by straightening and shortening the scope to                            obtain bowel loop reduction, applying abdominal                            pressure and lavage. The patient tolerated the  procedure fairly well. The quality of the bowel                            preparation was fair. The ileocecal valve,                            appendiceal orifice, and rectum were photographed. Scope In: 8:33:52 AM Scope Out: 8:45:00 AM Scope Withdrawal Time: 0 hours 7 minutes 52 seconds  Total Procedure Duration: 0 hours 11 minutes 8 seconds  Findings:      The perianal exam findings include non-thrombosed external hemorrhoids.      Internal hemorrhoids were found during retroflexion. The hemorrhoids       were medium-sized and Grade II (internal hemorrhoids that prolapse but       reduce spontaneously).      Multiple sessile, non-bleeding polyps were found in the rectum and       sigmoid colon. The polyps were small in size. Polypectomy was not       attempted due to the patient having thrombocytopenia.      The exam was otherwise normal throughout the examined colon. Impression:               - Preparation of the colon was fair.                            - Non-thrombosed external hemorrhoids found on                            perianal exam.                           - Internal hemorrhoids.                           - Multiple small, non-bleeding polyps in the rectum                            and in the sigmoid colon. Resection not attempted.                           - No specimens collected.                           - Mucosa friable with bleeding during retroflexion                            and during passage of the colonoscope. Moderate Sedation:      N/A - MAC procedure Recommendation:           - Patient has a contact number available for                            emergencies. The signs and symptoms of potential                            delayed complications were discussed with the  patient. Return to normal activities tomorrow.                            Written discharge instructions were provided to the                            patient.                           - No aspirin, ibuprofen, naproxen, or other                            non-steroidal anti-inflammatory drugs.                           - Low sodium diet.                           - Repeat colonoscopy to be determined depending on                            comorbidities and increased risk of bleeding due to                            thrombocytopenia. Procedure Code(s):        --- Professional ---                           [phone], Colonoscopy, flexible; diagnostic, including                            collection of specimen(s) by brushing or washing,                            when performed (separate procedure) Diagnosis Code(s):        --- Professional ---                           K92.1, Melena (includes Hematochezia)                           K62.5, Hemorrhage of anus and rectum                           K62.1, Rectal polyp                           D12.5, Benign neoplasm of sigmoid colon                            K64.1, Second degree hemorrhoids                           K64.4, Residual hemorrhoidal skin tags CPT copyright 2016 American Medical Association. All rights reserved. The codes documented in this report are preliminary and upon coder review may  be revised to meet current compliance requirements. Lear Ng, MD 09/26/2016 9:02:46 AM  This report has been signed electronically. Number of Addenda: 0

## 2016-09-30 NOTE — Telephone Encounter (Signed)
Oral Chemotherapy Pharmacist Encounter  I called and LVM for patient with request for financial documentation discussed on 11/2 in order to complete [employer] Patient [place] application. Pt requested to bring in 3 months of back statements to [facility] and to sign application. Once application is completed, Oral Chemo Clinic will fax to [employer].  Oral Chemo Clinic will continue to follow.  Johny Drilling, PharmD, BCPS 09/30/2016  4:27 PM Oral Chemotherapy Clinic [phone]

## 2016-10-03 ENCOUNTER — Telehealth: Payer: Self-pay | Admitting: Pharmacist

## 2016-10-03 NOTE — Telephone Encounter (Signed)
Pt left vm on Oral Chemo Clinic phone Fri 11/10 at 5:24 pm stating he missed a call from our clinic and requested a call back. I called pt today (11/13) and reminded him that he needed to bring in 3 mos bank statements and sign application for [employer] Pt Assistance for his Promacta. Pt stated he heard from [facility] and he was covered by Medicaid. I called [facility] and they did not have his Rx on file and stated they cannot get Promacta.  I think pt got confused w/ a different medication. I called [facility] and they stated pt still does not have Medicaid. I returned call to pt w/ this information. Pt stated he cannot come this week to [facility] due to house guests.  He said he would come next week on Tues (11/21). Note: Pt stated there are 3 adults living in household.  Pt has not filed income taxes since 2013.  He makes $808/mo in disability.  Kennith Center, Pharm.D., CPP 10/03/2016@9 :10 AM Oral chemo Clinic

## 2016-10-11 ENCOUNTER — Encounter: Payer: Self-pay | Admitting: Interventional Radiology

## 2016-10-14 ENCOUNTER — Emergency Department (HOSPITAL_COMMUNITY): Payer: Medicaid Other

## 2016-10-14 ENCOUNTER — Emergency Department (HOSPITAL_COMMUNITY)
Admission: EM | Admit: 2016-10-14 | Discharge: 2016-10-14 | Disposition: A | Discharge status: Home / Self Care | Payer: Medicaid Other | Attending: Emergency Medicine | Admitting: Emergency Medicine

## 2016-10-14 ENCOUNTER — Encounter (HOSPITAL_COMMUNITY): Payer: Self-pay

## 2016-10-14 DIAGNOSIS — I110 Hypertensive heart disease with heart failure: Secondary | ICD-10-CM | POA: Diagnosis not present

## 2016-10-14 DIAGNOSIS — Z87891 Personal history of nicotine dependence: Secondary | ICD-10-CM | POA: Diagnosis not present

## 2016-10-14 DIAGNOSIS — I252 Old myocardial infarction: Secondary | ICD-10-CM | POA: Insufficient documentation

## 2016-10-14 DIAGNOSIS — R140 Abdominal distension (gaseous): Secondary | ICD-10-CM | POA: Diagnosis present

## 2016-10-14 DIAGNOSIS — R188 Other ascites: Secondary | ICD-10-CM

## 2016-10-14 DIAGNOSIS — I2510 Atherosclerotic heart disease of native coronary artery without angina pectoris: Secondary | ICD-10-CM | POA: Insufficient documentation

## 2016-10-14 DIAGNOSIS — I5042 Chronic combined systolic (congestive) and diastolic (congestive) heart failure: Secondary | ICD-10-CM | POA: Insufficient documentation

## 2016-10-14 DIAGNOSIS — Z955 Presence of coronary angioplasty implant and graft: Secondary | ICD-10-CM | POA: Insufficient documentation

## 2016-10-14 DIAGNOSIS — Z8673 Personal history of transient ischemic attack (TIA), and cerebral infarction without residual deficits: Secondary | ICD-10-CM | POA: Diagnosis not present

## 2016-10-14 LAB — BASIC METABOLIC PANEL
Anion gap: 5 (ref 5–15)
BUN: 8 mg/dL (ref 6–20)
CHLORIDE: 105 mmol/L (ref 101–111)
CO2: 30 mmol/L (ref 22–32)
CREATININE: 1.02 mg/dL (ref 0.61–1.24)
Calcium: 8.6 mg/dL — ABNORMAL LOW (ref 8.9–10.3)
GFR calc non Af Amer: >60 mL/min (ref >60)
Glucose, Bld: 111 mg/dL — ABNORMAL HIGH (ref 65–99)
POTASSIUM: 4 mmol/L (ref 3.5–5.1)
Sodium: 140 mmol/L (ref 135–145)

## 2016-10-14 LAB — CBC
HEMATOCRIT: 33.4 % — AB (ref 39.0–52.0)
Hemoglobin: 11.1 g/dL — ABNORMAL LOW (ref 13.0–17.0)
MCH: 30.6 pg (ref 26.0–34.0)
MCHC: 33.2 g/dL (ref 30.0–36.0)
MCV: 92 fL (ref 78.0–100.0)
PLATELETS: 51 10*3/uL — AB (ref 150–400)
RBC: 3.63 MIL/uL — AB (ref 4.22–5.81)
RDW: 15.3 % (ref 11.5–15.5)
WBC: 3.2 10*3/uL — AB (ref 4.0–10.5)

## 2016-10-14 LAB — I-STAT TROPONIN, ED: Troponin i, poc: 0 ng/mL (ref 0.00–0.08)

## 2016-10-14 MED ORDER — LIDOCAINE HCL (PF) 1 % IJ SOLN
INTRAMUSCULAR | Status: AC
  Filled 2016-10-14: qty 10

## 2016-10-14 MED ORDER — SPIRONOLACTONE 25 MG PO TABS: 50.0000 mg | ORAL_TABLET | Freq: Every day | ORAL | 2 refills | Status: DC

## 2016-10-14 NOTE — ED Triage Notes (Signed)
Pt reports abd swelling that has gotten worse since yesterday. Abdomen is very distended. Pt states he has hx of CHF. He is supposed to be meeting his MD to have fluid removed.

## 2016-10-14 NOTE — Discharge Instructions (Signed)
Follow-up with her primary doctor next week, please follow up instructions  provided to you by Dr. Laurence Ferrari

## 2016-10-14 NOTE — ED Notes (Signed)
Pt gone to xray

## 2016-10-14 NOTE — ED Notes (Signed)
Patient transported to Ultrasound 

## 2016-10-14 NOTE — ED Provider Notes (Signed)
[place] DEPT Provider Note   CSN: [id number] Arrival date & time: 10/14/16  1257     History   Chief Complaint Chief Complaint  Patient presents with  . Edema  . Shortness of Breath    HPI Vincent Ochoa is a 60 y.o. male.  HPI Patient has a known history of cirrhosis, and esophageal varices.  Patient has history of ascites but he has been managed in the past without paracentesis. Patient has noticed increasing swelling over the last few days.  His abdomen is very distended now. He feels like it's affecting his ability to breathe. The patient has been feeling short of breath. He called [facility] radiology, because he sees Dr. Pascal Lux and Dr. Laurence Ferrari.  He spoke with Dr. Laurence Ferrari today who felt he most likely had ascites. He asked him to come into the emergency room to be evaluated with plans for a paracentesis today.  Patient denies any fevers. No vomiting or diarrhea. No chest or abdominal pain. Past Medical History:  Diagnosis Date  . Arthritis    "hands, knees, back" (07/05/2016)  . Back pain, lumbosacral 2014  . Coronary artery disease    a. Evaluated by Dr. Stanford Breed 2012 - nonobstructive 2007-2008 by cath. b. Abnormal stress test 10/2014 but cath deferred temporarily due to thrombocytopenia. c. 10/2014: readm with AF-RVR/NSTEMI - s/p BMS to mLCx 11/04/14 (mod dz in RCA);  d. relook cath 01/2015 and 06/02/2015 patent stent, nonobs CAD, EF 55-65%.  . Elevated LFTs   . Facial cellulitis 2014   Periorbital  . Fatty liver US - 2012  . Frequent headaches   . GERD (gastroesophageal reflux disease)   . History of blood transfusion 01/2016   "lost alot of blood vomiting"  . Hyperlipidemia   . Morbid obesity ([place])   . Obesity   . OSA on CPAP    severe with AHI 56/hr with successful CPAP titration to 18cm H2O  . PAF (paroxysmal atrial fibrillation) ([place])    a. Episode 10/2014 at time of NSTEMI, spont converted to NSR. Observation for further episodes (not on anticoag at  present time due to [facility] = 1, thrombocytopenia).  . Psoriasis   . Sinus bradycardia   . Stroke [facility])    "I've been told I'd had a mini stroke on my left side" (07/05/2016)  . Thrombocytopenia ([place])    a. Onset unclear, but noted 2012. ? 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 s/p prednisone, IVIG.    Patient Active Problem List   Diagnosis Date Noted  . Rectal bleeding 09/26/2016  . Nephrolithiasis 09/15/2016  . Chronic ITP (idiopathic thrombocytopenia) (HCC) 09/15/2016  . Varices, esophageal ([place]) 09/15/2016  . Essential hypertension   . Hematemesis 06/21/2016  . Chronic combined systolic and diastolic congestive heart failure ([place])   . Erectile dysfunction 06/03/2016  . Portal vein thrombosis   . Alcoholic cirrhosis of liver with ascites ([place])   . Diastolic heart failure ([place]) 10/07/2015  . Leukopenia 09/15/2015  . Acrochordon 07/18/2015  . PAF (paroxysmal atrial fibrillation) ([place])   . Obese   . Knee pain, bilateral 03/05/2015  . Depression 01/08/2015  . OSA (obstructive sleep apnea)   . NSTEMI (non-ST elevated myocardial infarction) ([place])   . Back pain, lumbosacral 01/04/2013  . Thrombocytopenia ([place]) 11/29/2012  . GERD (gastroesophageal reflux disease) 08/15/2011  . Coronary artery disease involving native coronary artery of native heart without angina pectoris 07/29/2011  . Psoriasis 07/29/2011    Past Surgical History:  Procedure Laterality Date  . CARDIAC CATHETERIZATION    . CARDIAC CATHETERIZATION N/A 06/02/2015   Procedure: Left Heart Cath and Coronary Angiography;  Surgeon: Jettie Booze, MD;  Location: [place] CV LAB;  Service: Cardiovascular;  Laterality: N/A;  . COLONOSCOPY WITH PROPOFOL N/A 09/26/2016   Procedure: COLONOSCOPY WITH PROPOFOL;  Surgeon: Wilford Corner, MD;  Location: [facility] ENDOSCOPY;  Service: Endoscopy;  Laterality: N/A;  . ESOPHAGOGASTRODUODENOSCOPY N/A 06/22/2016   Procedure:  ESOPHAGOGASTRODUODENOSCOPY (EGD);  Surgeon: Clarene Essex, MD;  Location: [facility] ENDOSCOPY;  Service: Endoscopy;  Laterality: N/A;  . ESOPHAGOGASTRODUODENOSCOPY (EGD) WITH PROPOFOL N/A 08/17/2016   Procedure: ESOPHAGOGASTRODUODENOSCOPY (EGD) WITH PROPOFOL;  Surgeon: Otis Brace, MD;  Location: [place];  Service: Gastroenterology;  Laterality: N/A;  . FACIAL COSMETIC SURGERY  1990s   "rodeo-related injury"  . IR GENERIC HISTORICAL  09/14/2016   IR RADIOLOGIST EVAL & MGMT 09/14/2016 Sandi Mariscal, MD [facility] INTERV RAD  . KNEE ARTHROPLASTY Left 1970's   "put cartilage in it"  . LAPAROSCOPIC CHOLECYSTECTOMY  2007  . LEFT HEART CATHETERIZATION WITH CORONARY ANGIOGRAM N/A 11/04/2014   Procedure: LEFT HEART CATHETERIZATION WITH CORONARY ANGIOGRAM;  Surgeon: Jettie Booze, MD;  Location: [facility] CATH LAB;  Service: Cardiovascular;  Laterality: N/A;  . LEFT HEART CATHETERIZATION WITH CORONARY ANGIOGRAM N/A 01/28/2015   Procedure: LEFT HEART CATHETERIZATION WITH CORONARY ANGIOGRAM;  Surgeon: Sherren Mocha, MD;  Location: [facility] CATH LAB;  Service: Cardiovascular;  Laterality: N/A;  . liver cirrhosis    . RADIOLOGY WITH ANESTHESIA N/A 02/04/2016   Procedure: RADIOLOGY WITH ANESTHESIA;  Surgeon: Medication Radiologist, MD;  Location: [place];  Service: Radiology;  Laterality: N/A;  . TONSILLECTOMY  1960's       Home Medications    Prior to Admission medications   Medication Sig Start Date End Date Taking? Authorizing Provider  atorvastatin (LIPITOR) 40 MG tablet Take 1 tablet (40 mg total) by mouth daily at 8 pm. 06/23/16  Yes Everrett Coombe, MD  buPROPion (WELLBUTRIN XL) 150 MG 24 hr tablet Take 1 tablet (150 mg total) by mouth daily. 08/12/16  Yes Sela Hilding, MD  carvedilol (COREG) 3.125 MG tablet Take 1 tablet (3.125 mg total) by mouth 2 (two) times daily with a meal. 08/12/16  Yes Sela Hilding, MD  furosemide (LASIX) 40 MG tablet Take 40 mg by mouth daily.   Yes Historical Provider, MD    isosorbide mononitrate (IMDUR) 30 MG 24 hr tablet Take 30 mg by mouth daily.   Yes Historical Provider, MD  lactulose (CHRONULAC) 10 GM/15ML solution Take 15 mLs (10 g total) by mouth 3 (three) times daily. 08/19/16  Yes Sela Hilding, MD  nitroGLYCERIN (NITROSTAT) 0.4 MG SL tablet Place 1 tablet (0.4 mg total) under the tongue every 5 (five) minutes as needed for chest pain (up to 3 doses). 10/31/14  Yes Dayna N Dunn, PA-C  pantoprazole (PROTONIX) 40 MG tablet Take 1 tablet (40 mg total) by mouth 2 (two) times daily. 06/03/16  Yes Sela Hilding, MD  sucralfate (CARAFATE) 1 g tablet Take 1 tablet (1 g total) by mouth 4 (four) times daily -  with meals and at bedtime. Dissolve in water. Patient taking differently: Take 1 g by mouth 2 (two) times daily. Dissolve in water. 08/19/16  Yes Sela Hilding, MD  eltrombopag (PROMACTA) 25 MG tablet Take 1 tablet (25 mg total) by mouth daily. Take on an empty stomach 1 hour before a meal or 2 hours after Patient not taking: Reported on 10/14/2016 09/21/16   Ni  Alvy Bimler, MD  oxyCODONE-acetaminophen (PERCOCET/ROXICET) 5-325 MG tablet Take 1-2 tablets by mouth every 4 (four) hours as needed for severe pain. Patient not taking: Reported on 10/14/2016 09/15/16   Virginia Crews, MD  spironolactone (ALDACTONE) 25 MG tablet Take 2 tablets (50 mg total) by mouth daily. 10/14/16   Ascencion Dike, PA-C    Family History Family History  Problem Relation Age of Onset  . Cirrhosis Mother   . Heart attack Mother   . Hypertension Mother   . Hypertension Father   . Stroke Father   . Arthritis Maternal Grandmother   . Heart disease Sister   . Diabetes Sister   . Cancer - Ovarian Sister   . Heart disease      Maternal/paternal grandparents  . Heart attack Maternal Aunt     Social History Social History  Substance Use Topics  . Smoking status: Former Smoker    Packs/day: 1.50    Years: 43.00    Types: Cigarettes    Quit date: 02/19/2016  .  Smokeless tobacco: Former Systems developer  . Alcohol use 0.0 oz/week     Comment: 07/05/2016 "mostly stopped in 1979; might have a beer q 6 months or so"     Allergies   Asa [aspirin]; Eliquis [apixaban]; Nsaids; Plavix [clopidogrel]; and Statins   Review of Systems Review of Systems  All other systems reviewed and are negative.    Physical Exam Updated Vital Signs BP 116/62   Pulse 76   Temp 97.9 F (36.6 C) (Oral)   Resp 13   SpO2 96%   Physical Exam  Constitutional: He appears well-developed and well-nourished. No distress.  HENT:  Head: Normocephalic and atraumatic.  Right Ear: External ear normal.  Left Ear: External ear normal.  Eyes: Conjunctivae are normal. Right eye exhibits no discharge. Left eye exhibits no discharge. No scleral icterus.  Neck: Neck supple. No tracheal deviation present.  Cardiovascular: Normal rate, regular rhythm and intact distal pulses.   Pulmonary/Chest: Effort normal and breath sounds normal. No stridor. No respiratory distress. He has no wheezes. He has no rales.  Abdominal: Soft. Bowel sounds are normal. He exhibits distension, fluid wave and ascites. There is no tenderness. There is no rebound and no guarding.  Tense distended abdomen consistent with significant ascites  Musculoskeletal: He exhibits no edema or tenderness.  Neurological: He is alert. He has normal strength. No cranial nerve deficit (no facial droop, extraocular movements intact, no slurred speech) or sensory deficit. He exhibits normal muscle tone. He displays no seizure activity. Coordination normal.  Skin: Skin is warm and dry. No rash noted.  Psychiatric: He has a normal mood and affect.  Nursing note and vitals reviewed.    ED Treatments / Results  Labs (all labs ordered are listed, but only abnormal results are displayed) Labs Reviewed  BASIC METABOLIC PANEL - Abnormal; Notable for the following:       Result Value   Glucose, Bld 111 (*)    Calcium 8.6 (*)    All  other components within normal limits  CBC - Abnormal; Notable for the following:    WBC 3.2 (*)    RBC 3.63 (*)    Hemoglobin 11.1 (*)    HCT 33.4 (*)    Platelets 51 (*)    All other components within normal limits  Randolm Idol, ED    EKG  EKG Interpretation  Date/Time:  Friday October 14 2016 13:11:54 EST Ventricular Rate:  75 PR Interval:  QRS Duration: 90 QT Interval:  396 QTC Calculation: 442 R Axis:   45 Text Interpretation:  Sinus rhythm Nonspecific ST abnormality Abnormal QRS-T angle, consider primary T wave abnormality Abnormal ECG No significant change since last tracing other than artifact Confirmed by Loyal Rudy  MD-J, Nonna Renninger [id number]) on 10/14/2016 2:34:37 PM       Radiology Dg Chest 2 View  Result Date: 10/14/2016 CLINICAL DATA:  Increase shortness of Breath EXAM: CHEST  2 VIEW COMPARISON:  09/13/2016 FINDINGS: The heart size and mediastinal contours are within normal limits. Both lungs are clear. The visualized skeletal structures are unremarkable. IMPRESSION: No active cardiopulmonary disease. Electronically Signed   By: Inez Catalina M.D.   On: 10/14/2016 13:54    Procedures Procedures (including critical care time)  Medications Ordered in ED Medications  lidocaine (PF) (XYLOCAINE) 1 % injection (not administered)     Initial Impression / Assessment and Plan / ED Course  I have reviewed the triage vital signs and the nursing notes.  Pertinent labs & imaging results that were available during my care of the patient were reviewed by me and considered in my medical decision making (see chart for details).  Clinical Course as of Oct 14 1632  Fri Oct 14, 2016  1434 I discussed the case with Dr. Laurence Ferrari. We reviewed the laboratory tests. He requested I place an order for an ultrasound paracentesis. He will evaluate and treat the patient today  [JK]    Clinical Course User Index [JK] Dorie Rank, MD    Patient had a therapeutic paracentesis. He felt  much better after the treatment and he asked to go home  Final Clinical Impressions(s) / ED Diagnoses   Final diagnoses:  Ascites    New Prescriptions Discharge Medication List as of 10/14/2016  4:21 PM    START taking these medications   Details  spironolactone (ALDACTONE) 25 MG tablet Take 2 tablets (50 mg total) by mouth daily., Starting Fri 10/14/2016, Normal         Dorie Rank, MD 10/14/16 [phone]

## 2016-10-17 NOTE — Telephone Encounter (Signed)
Oral Chemotherapy Pharmacist Encounter  I met with patient this morning who brought in financial documentation for [employer] Patient Vincent Ochoa (NPAF) application for Promacta. Pt also signed application.  NPAF application completed at faxed to [phone].  Oral Chemo Clinic will continue to follow.  Johny Drilling, PharmD, BCPS, BCOP 10/17/2016  10:47 AM Oral Oncology Clinic [phone]

## 2016-10-18 ENCOUNTER — Telehealth: Payer: Self-pay | Admitting: Hematology and Oncology

## 2016-10-18 ENCOUNTER — Ambulatory Visit (HOSPITAL_BASED_OUTPATIENT_CLINIC_OR_DEPARTMENT_OTHER): Payer: Medicaid Other | Admitting: Hematology and Oncology

## 2016-10-18 ENCOUNTER — Other Ambulatory Visit (HOSPITAL_BASED_OUTPATIENT_CLINIC_OR_DEPARTMENT_OTHER): Payer: Medicaid Other

## 2016-10-18 ENCOUNTER — Encounter: Payer: Self-pay | Admitting: Hematology and Oncology

## 2016-10-18 VITALS — BP 118/60 | HR 78 | Temp 97.8°F | Resp 16 | Ht 75.0 in | Wt 289.3 lb

## 2016-10-18 DIAGNOSIS — D693 Immune thrombocytopenic purpura: Secondary | ICD-10-CM

## 2016-10-18 DIAGNOSIS — D696 Thrombocytopenia, unspecified: Secondary | ICD-10-CM

## 2016-10-18 DIAGNOSIS — R188 Other ascites: Secondary | ICD-10-CM

## 2016-10-18 DIAGNOSIS — K7460 Unspecified cirrhosis of liver: Secondary | ICD-10-CM

## 2016-10-18 DIAGNOSIS — R3129 Other microscopic hematuria: Secondary | ICD-10-CM | POA: Insufficient documentation

## 2016-10-18 LAB — CBC WITH DIFFERENTIAL/PLATELET
BASO%: 0.2 % (ref 0.0–2.0)
BASOS ABS: 0 10*3/uL (ref 0.0–0.1)
EOS ABS: 0.1 10*3/uL (ref 0.0–0.5)
EOS%: 2.4 % (ref 0.0–7.0)
HEMATOCRIT: 32.4 % — AB (ref 38.4–49.9)
HEMOGLOBIN: 11 g/dL — AB (ref 13.0–17.1)
LYMPH#: 1.2 10*3/uL (ref 0.9–3.3)
LYMPH%: 29.1 % (ref 14.0–49.0)
MCH: 30.7 pg (ref 27.2–33.4)
MCHC: 34 g/dL (ref 32.0–36.0)
MCV: 90.5 fL (ref 79.3–98.0)
MONO#: 0.7 10*3/uL (ref 0.1–0.9)
MONO%: 16.7 % — AB (ref 0.0–14.0)
NEUT#: 2.2 10*3/uL (ref 1.5–6.5)
NEUT%: 51.6 % (ref 39.0–75.0)
Platelets: 46 10*3/uL — ABNORMAL LOW (ref 140–400)
RBC: 3.58 10*6/uL — ABNORMAL LOW (ref 4.20–5.82)
RDW: 15.2 % — AB (ref 11.0–14.6)
WBC: 4.2 10*3/uL (ref 4.0–10.3)

## 2016-10-18 LAB — COMPREHENSIVE METABOLIC PANEL
ALBUMIN: 2.5 g/dL — AB (ref 3.5–5.0)
ALK PHOS: 111 U/L (ref 40–150)
ALT: 56 U/L — ABNORMAL HIGH (ref 0–55)
AST: 84 U/L — ABNORMAL HIGH (ref 5–34)
Anion Gap: 7 mEq/L (ref 3–11)
BUN: 14.4 mg/dL (ref 7.0–26.0)
CHLORIDE: 104 meq/L (ref 98–109)
CO2: 28 mEq/L (ref 22–29)
Calcium: 8.6 mg/dL (ref 8.4–10.4)
Creatinine: 1.2 mg/dL (ref 0.7–1.3)
EGFR: 68 mL/min/{1.73_m2} — AB (ref >90)
Glucose: 108 mg/dl (ref 70–140)
POTASSIUM: 4 meq/L (ref 3.5–5.1)
Sodium: 139 mEq/L (ref 136–145)
Total Bilirubin: 1.92 mg/dL — ABNORMAL HIGH (ref 0.20–1.20)
Total Protein: 7 g/dL (ref 6.4–8.3)

## 2016-10-18 NOTE — Telephone Encounter (Signed)
Appointments scheduled per 10/18/16 los. A copy of the AVS  Report and appointment schedule was given to the patient, per 10/18/16 los.  °

## 2016-10-18 NOTE — Assessment & Plan Note (Signed)
He has microscopic hematuria without symptoms of dysuria, frequency or urgency. I suspect this is related to minor bleeding complication from thrombocytopenia. I recommend close observation for now. I plan to recheck urinalysis and urine cultures with his next visit to exclude untreated urinary tract infection

## 2016-10-18 NOTE — Assessment & Plan Note (Signed)
The patient had liver cirrhosis and recent ascites, with therapeutic paracentesis is recently. He felt that he has reaccumulation of abdominal ascites. The patient is compliant taking Spironolactone and furosemide. I recommend close follow-up with GI specialist for further management of ascites

## 2016-10-18 NOTE — Assessment & Plan Note (Signed)
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 He has started to have frequent bleeding complication related to varices/cirrhosis. The patient has been tried on IVIG and prednisone without beneficial bumping platelet count. We discussed the risk and benefit of Promacta and he agreed to proceed. Unfortunately, he has not received his treatment due to financial issues. Hopefully, we can apply for assistance to pay for this medication. The goal is to maintain platelet count greater than 50,000 on a regular basis If we cannot get assistance to pay for Promacta, I plan to bring him back for weekly Nplate injection

## 2016-10-18 NOTE — Progress Notes (Signed)
[place] OFFICE PROGRESS NOTE  Ralene Ok, MD SUMMARY OF HEMATOLOGIC HISTORY:  Vincent Ochoa had history of cardiac disease was admitted via Emergency Department for recurrent chest pain.He was initially seen in consultation on 12/8 for thrombocytopenia, felt to be likely autoimmune (related to psoriasis and liver disease), responding well to 2 doses of IVIG in addition to prednisone and transfusions. Patient had been discharged on 12/11 home with baby aspirin. He was subsequently readmitted to the hospital again on 11/03/2014 with recurrent chest pain and subsequently underwent cardiac catheterization and placement of bare metal stent on 11/04/2014. On 12/31/2014, he discontinued prednisone Throughout 2017, he had recurrent admission to the hospital due to chest pain, acute GI bleed, encephalopathy, bleeding gastric varices, alcoholic cirrhosis of the liver and respiratory failure. He received embolization of bleeding vessels and recurrent episodes of paracentesis for abdominal ascites On 08/17/2016, EGD revealed large varices were found in the lower third of the esophagus, treated successfully with bending. Scattered moderate inflammation/gastritis was noted in the stomach On 09/26/2016, colonoscopy revealed internal hemorrhoids On 10/14/2016, he underwent ultrasound-guided paracentesis of which more than 10 L of ascitic fluid was removed  INTERVAL HISTORY: Vincent Ochoa 60 y.o. male returns for follow-up. Unfortunately, he has not received his prescription/treatment of Promacta yet. He has occasional hematuria, microscopic without passage of large amount of clots. He denies dysuria, frequency or urgency. He denies recent hemoptysis/hematemesis/nosebleeds He felt better after therapeutic paracentesis last week but felt that he has reaccumulation of ascites He denies recent chest pain or shortness breath. He has not smoke recently  I have reviewed the past medical  history, past surgical history, social history and family history with the patient and they are unchanged from previous note.  ALLERGIES:  is allergic to asa [aspirin]; eliquis [apixaban]; nsaids; plavix [clopidogrel]; and statins.  MEDICATIONS:  Current Outpatient Prescriptions  Medication Sig Dispense Refill  . atorvastatin (LIPITOR) 40 MG tablet Take 1 tablet (40 mg total) by mouth daily at 8 pm. 30 tablet 0  . buPROPion (WELLBUTRIN XL) 150 MG 24 hr tablet Take 1 tablet (150 mg total) by mouth daily. 30 tablet 3  . carvedilol (COREG) 3.125 MG tablet Take 1 tablet (3.125 mg total) by mouth 2 (two) times daily with a meal. 60 tablet 0  . furosemide (LASIX) 40 MG tablet Take 40 mg by mouth daily.    . isosorbide mononitrate (IMDUR) 30 MG 24 hr tablet Take 30 mg by mouth daily.    Marland Kitchen lactulose (CHRONULAC) 10 GM/15ML solution Take 15 mLs (10 g total) by mouth 3 (three) times daily. 240 mL 0  . nitroGLYCERIN (NITROSTAT) 0.4 MG SL tablet Place 1 tablet (0.4 mg total) under the tongue every 5 (five) minutes as needed for chest pain (up to 3 doses). 25 tablet 3  . oxyCODONE-acetaminophen (PERCOCET/ROXICET) 5-325 MG tablet Take 1-2 tablets by mouth every 4 (four) hours as needed for severe pain. 30 tablet 0  . pantoprazole (PROTONIX) 40 MG tablet Take 1 tablet (40 mg total) by mouth 2 (two) times daily. 60 tablet 2  . spironolactone (ALDACTONE) 25 MG tablet Take 2 tablets (50 mg total) by mouth daily. 60 tablet 2  . sucralfate (CARAFATE) 1 g tablet Take 1 tablet (1 g total) by mouth 4 (four) times daily -  with meals and at bedtime. Dissolve in water. (Patient taking differently: Take 1 g by mouth 2 (two) times daily. Dissolve in water.) 120 tablet 2  . eltrombopag (PROMACTA) 25  MG tablet Take 1 tablet (25 mg total) by mouth daily. Take on an empty stomach 1 hour before a meal or 2 hours after (Patient not taking: Reported on 10/18/2016) 30 tablet 11   No current facility-administered medications for  this visit.      REVIEW OF SYSTEMS:   Constitutional: Denies fevers, chills or night sweats Eyes: Denies blurriness of vision Ears, nose, mouth, throat, and face: Denies mucositis or sore throat Respiratory: Denies cough, dyspnea or wheezes Cardiovascular: Denies palpitation, chest discomfort or lower extremity swelling Gastrointestinal:  Denies nausea, heartburn or change in bowel habits Skin: Denies abnormal skin rashes Lymphatics: Denies new lymphadenopathy  Neurological:Denies numbness, tingling or new weaknesses Behavioral/Psych: Mood is stable, no new changes  All other systems were reviewed with the patient and are negative.  PHYSICAL EXAMINATION: ECOG PERFORMANCE STATUS: 1 - Symptomatic but completely ambulatory  Vitals:   10/18/16 1348  BP: 118/60  Pulse: 78  Resp: 16  Temp: 97.8 F (36.6 C)   Filed Weights   10/18/16 1348  Weight: 289 lb 4.8 oz (131.2 kg)    GENERAL:alert, no distress and comfortable. He is morbidly obese SKIN: Noticeable psoriatic plaque and some minor bruising EYES: normal, Conjunctiva are pink and non-injected, sclera clear Musculoskeletal:no cyanosis of digits and no clubbing  NEURO: alert & oriented x 3 with fluent speech, no focal motor/sensory deficits  LABORATORY DATA:  I have reviewed the data as listed     Component Value Date/Time   NA 140 10/14/2016 1311   K 4.0 10/14/2016 1311   CL 105 10/14/2016 1311   CO2 30 10/14/2016 1311   GLUCOSE 111 (H) 10/14/2016 1311   BUN 8 10/14/2016 1311   CREATININE 1.02 10/14/2016 1311   CREATININE 1.26 (H) 03/03/2016 1102   CALCIUM 8.6 (L) 10/14/2016 1311   PROT 6.1 (L) 08/18/2016 0315   ALBUMIN 2.5 (L) 08/18/2016 1518   AST 68 (H) 08/18/2016 0315   ALT 48 08/18/2016 0315   ALKPHOS 72 08/18/2016 0315   BILITOT 2.3 (H) 08/18/2016 0315   GFRNONAA >60 10/14/2016 1311   GFRNONAA 62 03/03/2016 1102   GFRAA >60 10/14/2016 1311   GFRAA 71 03/03/2016 1102    No results found for: SPEP,  UPEP  Lab Results  Component Value Date   WBC 4.2 10/18/2016   NEUTROABS 2.2 10/18/2016   HGB 11.0 (L) 10/18/2016   HCT 32.4 (L) 10/18/2016   MCV 90.5 10/18/2016   PLT 46 (L) 10/18/2016      Chemistry      Component Value Date/Time   NA 140 10/14/2016 1311   K 4.0 10/14/2016 1311   CL 105 10/14/2016 1311   CO2 30 10/14/2016 1311   BUN 8 10/14/2016 1311   CREATININE 1.02 10/14/2016 1311   CREATININE 1.26 (H) 03/03/2016 1102      Component Value Date/Time   CALCIUM 8.6 (L) 10/14/2016 1311   ALKPHOS 72 08/18/2016 0315   AST 68 (H) 08/18/2016 0315   ALT 48 08/18/2016 0315   BILITOT 2.3 (H) 08/18/2016 0315      ASSESSMENT & PLAN:  Chronic ITP (idiopathic thrombocytopenia) (HCC) He has multifactorial thrombocytopenia related to chronic ITP, liver cirrhosis and psoriasis He will continue antiplatelet agents as long as his platelet count remained above 30,000. He has started to have frequent bleeding complication related to varices/cirrhosis. The patient has been tried on IVIG and prednisone without beneficial bumping platelet count. We discussed the risk and benefit of Promacta and he agreed to  proceed. Unfortunately, he has not received his treatment due to financial issues. Hopefully, we can apply for assistance to pay for this medication. The goal is to maintain platelet count greater than 50,000 on a regular basis If we cannot get assistance to pay for Promacta, I plan to bring him back for weekly Nplate injection  Cirrhosis of liver with ascites ([place]) The patient had liver cirrhosis and recent ascites, with therapeutic paracentesis is recently. He felt that he has reaccumulation of abdominal ascites. The patient is compliant taking Spironolactone and furosemide. I recommend close follow-up with GI specialist for further management of ascites  Hematuria, microscopic He has microscopic hematuria without symptoms of dysuria, frequency or urgency. I suspect this is  related to minor bleeding complication from thrombocytopenia. I recommend close observation for now. I plan to recheck urinalysis and urine cultures with his next visit to exclude untreated urinary tract infection   Orders Placed This Encounter  Procedures  . Urine culture    Standing Status:   Future    Standing Expiration Date:   11/22/2017  . CBC with Differential/Platelet    Standing Status:   Standing    Number of Occurrences:   22    Standing Expiration Date:   10/18/2017  . Urinalysis, Microscopic - [facility]    Standing Status:   Future    Standing Expiration Date:   11/22/2017    All questions were answered. The patient knows to call the clinic with any problems, questions or concerns. No barriers to learning was detected.  I spent 15 minutes counseling the patient face to face. The total time spent in the appointment was 20 minutes and more than 50% was on counseling.     Heath Lark, MD 11/28/20172:13 PM

## 2016-10-19 LAB — VITAMIN B12: VITAMIN B 12: 835 pg/mL (ref 211–946)

## 2016-10-22 ENCOUNTER — Other Ambulatory Visit: Payer: Self-pay | Admitting: Family Medicine

## 2016-10-22 DIAGNOSIS — R5383 Other fatigue: Secondary | ICD-10-CM

## 2016-10-24 NOTE — Telephone Encounter (Signed)
Oral Chemotherapy Pharmacist Encounter  Received notification from [place] (NPAF) that patient has been successfully enrolled in their program to receive Promacta free of charge until 10/20/2017 as long as all program eligibility criteria continue to be met.  Patient and MD have been notified. Pt is aware that he will have to be re-enrolled into NPAF program this time next year if he is continue on treatment and still requires free medication. All questions answered and next appointment with Dr. Alvy Bimler on 11/10/16 confirmed.  Oral Oncology Clinic will sign off at this time. Please let us know if we can be of further assistance in the future.  Johny Drilling, PharmD, BCPS, BCOP 10/24/2016  10:50 AM Oral Oncology Clinic [phone]

## 2016-10-25 ENCOUNTER — Telehealth: Payer: Self-pay | Admitting: Family Medicine

## 2016-10-25 NOTE — Telephone Encounter (Signed)
Routing to PCP as an FYI. Zimmerman Rumple, Klair Leising D, CMA  

## 2016-10-25 NOTE — Telephone Encounter (Signed)
Called patient, left message for him to call office back. If patient calls please schedule him for an appt per pcp. Nat Christen, CMA

## 2016-10-25 NOTE — Telephone Encounter (Signed)
Dr. Lindell Noe only had an appointment for the 28th of this month and that wouldn't work for pt, so he is scheduled with Dr. Juanito Doom on 11-01-16. ep

## 2016-10-25 NOTE — Telephone Encounter (Signed)
Got a refill request for lasix. I don't think he is supposed to be taking this. I refused it stating he needs an appointment, but please call him and have him come in to see me. Thanks.

## 2016-10-26 ENCOUNTER — Emergency Department (HOSPITAL_COMMUNITY)
Admission: EM | Admit: 2016-10-26 | Discharge: 2016-10-26 | Disposition: A | Discharge status: Home / Self Care | Payer: Medicaid Other | Attending: Emergency Medicine | Admitting: Emergency Medicine

## 2016-10-26 ENCOUNTER — Telehealth: Payer: Self-pay | Admitting: Radiology

## 2016-10-26 ENCOUNTER — Telehealth: Payer: Self-pay | Admitting: *Deleted

## 2016-10-26 ENCOUNTER — Encounter (HOSPITAL_COMMUNITY): Payer: Self-pay | Admitting: *Deleted

## 2016-10-26 ENCOUNTER — Emergency Department (HOSPITAL_COMMUNITY): Payer: Medicaid Other

## 2016-10-26 DIAGNOSIS — I252 Old myocardial infarction: Secondary | ICD-10-CM | POA: Diagnosis not present

## 2016-10-26 DIAGNOSIS — I2510 Atherosclerotic heart disease of native coronary artery without angina pectoris: Secondary | ICD-10-CM | POA: Diagnosis not present

## 2016-10-26 DIAGNOSIS — R0602 Shortness of breath: Secondary | ICD-10-CM | POA: Diagnosis not present

## 2016-10-26 DIAGNOSIS — Z87891 Personal history of nicotine dependence: Secondary | ICD-10-CM | POA: Diagnosis not present

## 2016-10-26 DIAGNOSIS — I5042 Chronic combined systolic (congestive) and diastolic (congestive) heart failure: Secondary | ICD-10-CM | POA: Diagnosis not present

## 2016-10-26 DIAGNOSIS — R188 Other ascites: Secondary | ICD-10-CM | POA: Diagnosis not present

## 2016-10-26 DIAGNOSIS — I110 Hypertensive heart disease with heart failure: Secondary | ICD-10-CM | POA: Diagnosis not present

## 2016-10-26 DIAGNOSIS — Z7689 Persons encountering health services in other specified circumstances: Secondary | ICD-10-CM

## 2016-10-26 DIAGNOSIS — Z8673 Personal history of transient ischemic attack (TIA), and cerebral infarction without residual deficits: Secondary | ICD-10-CM | POA: Insufficient documentation

## 2016-10-26 DIAGNOSIS — Z79899 Other long term (current) drug therapy: Secondary | ICD-10-CM | POA: Diagnosis not present

## 2016-10-26 LAB — URINALYSIS, ROUTINE W REFLEX MICROSCOPIC
Bilirubin Urine: NEGATIVE
GLUCOSE, UA: NEGATIVE mg/dL
Ketones, ur: NEGATIVE mg/dL
LEUKOCYTES UA: NEGATIVE
NITRITE: NEGATIVE
PH: 6 (ref 5.0–8.0)
Protein, ur: NEGATIVE mg/dL
SPECIFIC GRAVITY, URINE: 1.021 (ref 1.005–1.030)
Squamous Epithelial / LPF: NONE SEEN

## 2016-10-26 LAB — CBC
HEMATOCRIT: 33.8 % — AB (ref 39.0–52.0)
HEMOGLOBIN: 11.3 g/dL — AB (ref 13.0–17.0)
MCH: 30.5 pg (ref 26.0–34.0)
MCHC: 33.4 g/dL (ref 30.0–36.0)
MCV: 91.1 fL (ref 78.0–100.0)
Platelets: 71 10*3/uL — ABNORMAL LOW (ref 150–400)
RBC: 3.71 MIL/uL — ABNORMAL LOW (ref 4.22–5.81)
RDW: 15.3 % (ref 11.5–15.5)
WBC: 4.9 10*3/uL (ref 4.0–10.5)

## 2016-10-26 LAB — BASIC METABOLIC PANEL
ANION GAP: 7 (ref 5–15)
BUN: 9 mg/dL (ref 6–20)
CHLORIDE: 106 mmol/L (ref 101–111)
CO2: 23 mmol/L (ref 22–32)
Calcium: 8.9 mg/dL (ref 8.9–10.3)
Creatinine, Ser: 0.92 mg/dL (ref 0.61–1.24)
GFR calc Af Amer: >60 mL/min (ref >60)
GFR calc non Af Amer: >60 mL/min (ref >60)
GLUCOSE: 91 mg/dL (ref 65–99)
POTASSIUM: 4.5 mmol/L (ref 3.5–5.1)
SODIUM: 136 mmol/L (ref 135–145)

## 2016-10-26 LAB — I-STAT TROPONIN, ED: Troponin i, poc: 0 ng/mL (ref 0.00–0.08)

## 2016-10-26 LAB — BODY FLUID CELL COUNT WITH DIFFERENTIAL
Eos, Fluid: 0 %
LYMPHS FL: 52 %
Monocyte-Macrophage-Serous Fluid: 45 % — ABNORMAL LOW (ref 50–90)
Neutrophil Count, Fluid: 3 % (ref 0–25)
Total Nucleated Cell Count, Fluid: 90 cu mm (ref 0–1000)

## 2016-10-26 LAB — PROTIME-INR
INR: 1.37
PROTHROMBIN TIME: 17 s — AB (ref 11.4–15.2)

## 2016-10-26 LAB — GRAM STAIN

## 2016-10-26 MED ORDER — LIDOCAINE HCL (PF) 1 % IJ SOLN
30.0000 mL | Freq: Once | INTRAMUSCULAR | Status: AC
  Administered 2016-10-26: 30 mL
  Filled 2016-10-26: qty 30

## 2016-10-26 NOTE — ED Triage Notes (Signed)
Pt reports abd swelling and sob. Hx of getting fluid drained 1 week ago, hx of chf. ekg done, no resp distress noted.

## 2016-10-26 NOTE — Telephone Encounter (Signed)
Called patient again, still no answer;left voicemail for patient to call office and schedule appointment. Please schedule if patient calls back. Thanks. Nat Christen, CMA

## 2016-10-26 NOTE — Telephone Encounter (Signed)
Patient called stating that his "abdomen is filling up again" (recent paracentesis of 10/14/16 of 10.8 L).   Also, states that his urine is pinkish tinged and has a foul odor.  Afebrile.    Complaining of fatigue.    Per Dr Pascal Lux:  Patient instructed to go to [facility] ED for treatment.   Patient to discuss this with his wife and states that he will most likely go to [facility] ED as instructed.  Kirke Breach Riki Rusk, RN 10/26/2016 3:42 PM

## 2016-10-26 NOTE — ED Provider Notes (Signed)
[facility] DEPT Provider Note   CSN: [id number] Arrival date & time: 10/26/16  [id number]     History   Chief Complaint Chief Complaint  Patient presents with  . Shortness of Breath    HPI Vincent Ochoa is a 60 y.o. male  Patient presents the emergency department with chief complaint of shortness of breath due to tight and swollen abdomen. I the patient states that he had fluid drawn off of his abdomen on November 24. He states that his abdomen has gotten increasingly swollen over the past several days. He states "too early to have this much fluid on it again." The patient states that he had 11 L taken off of his abdomen. He denies any cough, chest pain.      HPI  Past Medical History:  Diagnosis Date  . Arthritis    "hands, knees, back" (07/05/2016)  . Back pain, lumbosacral 2014  . Coronary artery disease    a. Evaluated by Dr. Stanford Breed 2012 - nonobstructive 2007-2008 by cath. b. Abnormal stress test 10/2014 but cath deferred temporarily due to thrombocytopenia. c. 10/2014: readm with AF-RVR/NSTEMI - s/p BMS to mLCx 11/04/14 (mod dz in RCA);  d. relook cath 01/2015 and 06/02/2015 patent stent, nonobs CAD, EF 55-65%.  . Elevated LFTs   . Facial cellulitis 2014   Periorbital  . Fatty liver US - 2012  . Frequent headaches   . GERD (gastroesophageal reflux disease)   . History of blood transfusion 01/2016   "lost alot of blood vomiting"  . Hyperlipidemia   . Morbid obesity ([place])   . Obesity   . OSA on CPAP    severe with AHI 56/hr with successful CPAP titration to 18cm H2O  . PAF (paroxysmal atrial fibrillation) ([place])    a. Episode 10/2014 at time of NSTEMI, spont converted to NSR. Observation for further episodes (not on anticoag at present time due to [facility] = 1, thrombocytopenia).  . Psoriasis   . Sinus bradycardia   . Stroke [facility])    "I've been told I'd had a mini stroke on my left side" (07/05/2016)  . Thrombocytopenia ([place])    a. Onset unclear, but noted 2012. ?  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 s/p prednisone, IVIG.    Patient Active Problem List   Diagnosis Date Noted  . Hematuria, microscopic 10/18/2016  . Rectal bleeding 09/26/2016  . Nephrolithiasis 09/15/2016  . Chronic ITP (idiopathic thrombocytopenia) (HCC) 09/15/2016  . Varices, esophageal ([place]) 09/15/2016  . Essential hypertension   . Hematemesis 06/21/2016  . Chronic combined systolic and diastolic congestive heart failure ([place])   . Erectile dysfunction 06/03/2016  . Cirrhosis of liver with ascites ([place]) 02/25/2016  . Portal vein thrombosis   . Alcoholic cirrhosis of liver with ascites ([place])   . Diastolic heart failure ([place]) 10/07/2015  . Leukopenia 09/15/2015  . Acrochordon 07/18/2015  . PAF (paroxysmal atrial fibrillation) ([place])   . Obese   . Knee pain, bilateral 03/05/2015  . Depression 01/08/2015  . OSA (obstructive sleep apnea)   . NSTEMI (non-ST elevated myocardial infarction) ([place])   . Back pain, lumbosacral 01/04/2013  . Thrombocytopenia ([place]) 11/29/2012  . GERD (gastroesophageal reflux disease) 08/15/2011  . Coronary artery disease involving native coronary artery of native heart without angina pectoris 07/29/2011  . Psoriasis 07/29/2011    Past Surgical History:  Procedure Laterality Date  . CARDIAC CATHETERIZATION    . CARDIAC CATHETERIZATION N/A 06/02/2015   Procedure: Left Heart Cath and  Coronary Angiography;  Surgeon: Jettie Booze, MD;  Location: [facility] CV LAB;  Service: Cardiovascular;  Laterality: N/A;  . COLONOSCOPY WITH PROPOFOL N/A 09/26/2016   Procedure: COLONOSCOPY WITH PROPOFOL;  Surgeon: Wilford Corner, MD;  Location: [facility] ENDOSCOPY;  Service: Endoscopy;  Laterality: N/A;  . ESOPHAGOGASTRODUODENOSCOPY N/A 06/22/2016   Procedure: ESOPHAGOGASTRODUODENOSCOPY (EGD);  Surgeon: Clarene Essex, MD;  Location: [facility] ENDOSCOPY;  Service: Endoscopy;  Laterality: N/A;  . ESOPHAGOGASTRODUODENOSCOPY (EGD) WITH PROPOFOL N/A  08/17/2016   Procedure: ESOPHAGOGASTRODUODENOSCOPY (EGD) WITH PROPOFOL;  Surgeon: Otis Brace, MD;  Location: [facility];  Service: Gastroenterology;  Laterality: N/A;  . FACIAL COSMETIC SURGERY  1990s   "rodeo-related injury"  . IR GENERIC HISTORICAL  09/14/2016   IR RADIOLOGIST EVAL & MGMT 09/14/2016 Sandi Mariscal, MD [facility] INTERV RAD  . KNEE ARTHROPLASTY Left 1970's   "put cartilage in it"  . LAPAROSCOPIC CHOLECYSTECTOMY  2007  . LEFT HEART CATHETERIZATION WITH CORONARY ANGIOGRAM N/A 11/04/2014   Procedure: LEFT HEART CATHETERIZATION WITH CORONARY ANGIOGRAM;  Surgeon: Jettie Booze, MD;  Location: [facility] CATH LAB;  Service: Cardiovascular;  Laterality: N/A;  . LEFT HEART CATHETERIZATION WITH CORONARY ANGIOGRAM N/A 01/28/2015   Procedure: LEFT HEART CATHETERIZATION WITH CORONARY ANGIOGRAM;  Surgeon: Sherren Mocha, MD;  Location: [facility] CATH LAB;  Service: Cardiovascular;  Laterality: N/A;  . liver cirrhosis    . RADIOLOGY WITH ANESTHESIA N/A 02/04/2016   Procedure: RADIOLOGY WITH ANESTHESIA;  Surgeon: Medication Radiologist, MD;  Location: [facility];  Service: Radiology;  Laterality: N/A;  . TONSILLECTOMY  1960's       Home Medications    Prior to Admission medications   Medication Sig Start Date End Date Taking? Authorizing Provider  atorvastatin (LIPITOR) 40 MG tablet Take 1 tablet (40 mg total) by mouth daily at 8 pm. 06/23/16  Yes Everrett Coombe, MD  buPROPion (WELLBUTRIN XL) 150 MG 24 hr tablet Take 1 tablet (150 mg total) by mouth daily. 08/12/16  Yes Sela Hilding, MD  carvedilol (COREG) 3.125 MG tablet Take 1 tablet (3.125 mg total) by mouth 2 (two) times daily with a meal. 08/12/16  Yes Sela Hilding, MD  furosemide (LASIX) 40 MG tablet Take 80 mg by mouth daily.    Yes Historical Provider, MD  isosorbide mononitrate (IMDUR) 30 MG 24 hr tablet Take 30 mg by mouth daily.   Yes Historical Provider, MD  pantoprazole (PROTONIX) 40 MG tablet Take 1 tablet (40 mg total) by mouth 2  (two) times daily. 06/03/16  Yes Sela Hilding, MD  spironolactone (ALDACTONE) 25 MG tablet Take 2 tablets (50 mg total) by mouth daily. 10/14/16  Yes Ascencion Dike, PA-C  eltrombopag (PROMACTA) 25 MG tablet Take 1 tablet (25 mg total) by mouth daily. Take on an empty stomach 1 hour before a meal or 2 hours after Patient not taking: Reported on 10/26/2016 09/21/16   Heath Lark, MD  lactulose (CHRONULAC) 10 GM/15ML solution Take 15 mLs (10 g total) by mouth 3 (three) times daily. Patient not taking: Reported on 10/26/2016 08/19/16   Sela Hilding, MD  nitroGLYCERIN (NITROSTAT) 0.4 MG SL tablet Place 1 tablet (0.4 mg total) under the tongue every 5 (five) minutes as needed for chest pain (up to 3 doses). 10/31/14   Dayna N Dunn, PA-C  oxyCODONE-acetaminophen (PERCOCET/ROXICET) 5-325 MG tablet Take 1-2 tablets by mouth every 4 (four) hours as needed for severe pain. Patient not taking: Reported on 10/26/2016 09/15/16   Virginia Crews, MD  sucralfate (CARAFATE) 1 g tablet Take 1 tablet (  1 g total) by mouth 4 (four) times daily -  with meals and at bedtime. Dissolve in water. Patient not taking: Reported on 10/26/2016 08/19/16   Sela Hilding, MD    Family History Family History  Problem Relation Age of Onset  . Cirrhosis Mother   . Heart attack Mother   . Hypertension Mother   . Hypertension Father   . Stroke Father   . Arthritis Maternal Grandmother   . Heart disease Sister   . Diabetes Sister   . Cancer - Ovarian Sister   . Heart disease      Maternal/paternal grandparents  . Heart attack Maternal Aunt     Social History Social History  Substance Use Topics  . Smoking status: Former Smoker    Packs/day: 1.50    Years: 43.00    Types: Cigarettes    Quit date: 02/19/2016  . Smokeless tobacco: Former Systems developer  . Alcohol use 0.0 oz/week     Comment: 07/05/2016 "mostly stopped in 1979; might have a beer q 6 months or so"     Allergies   Asa [aspirin]; Eliquis [apixaban];  Nsaids; Plavix [clopidogrel]; and Statins   Review of Systems Review of Systems  Ten systems reviewed and are negative for acute change, except as noted in the HPI.   Physical Exam Updated Vital Signs BP 120/71   Pulse 70   Temp 98.1 F (36.7 C) (Oral)   Resp 18   Ht 6\' 3"  (1.905 m)   Wt 136 kg   SpO2 97%   BMI 37.47 kg/m   Physical Exam  Constitutional: He appears well-developed and well-nourished. No distress.  HENT:  Head: Normocephalic and atraumatic.  Eyes: Conjunctivae are normal. No scleral icterus.  Neck: Normal range of motion. Neck supple.  Cardiovascular: Normal rate, regular rhythm and normal heart sounds.   Pulmonary/Chest: Effort normal and breath sounds normal. No respiratory distress. He has no wheezes. He has no rales.  Abdominal: Soft. He exhibits distension. There is no tenderness.  Tight distended abdomen. Non tender.  Musculoskeletal: He exhibits no edema.  Neurological: He is alert.  Skin: Skin is warm and dry. He is not diaphoretic.  Psychiatric: His behavior is normal.  Nursing note and vitals reviewed.    ED Treatments / Results  Labs (all labs ordered are listed, but only abnormal results are displayed) Labs Reviewed  CBC - Abnormal; Notable for the following:       Result Value   RBC 3.71 (*)    Hemoglobin 11.3 (*)    HCT 33.8 (*)    Platelets 71 (*)    All other components within normal limits  BASIC METABOLIC PANEL  I-STAT TROPOININ, ED    EKG  EKG Interpretation None       Radiology Dg Chest 2 View  Result Date: 10/26/2016 CLINICAL DATA:  Shortness of breath. Abdominal swelling. Cirrhosis. EXAM: CHEST  2 VIEW COMPARISON:  10/14/2016 FINDINGS: The heart size and pulmonary vascularity are normal and lungs are clear. No effusions. No bone abnormality. IMPRESSION: Normal chest. Electronically Signed   By: Lorriane Shire M.D.   On: 10/26/2016 17:19    Procedures Procedures (including critical care time)  Medications  Ordered in ED Medications - No data to display   Initial Impression / Assessment and Plan / ED Course  I have reviewed the triage vital signs and the nursing notes.  Pertinent labs & imaging results that were available during my care of the patient were reviewed by me  and considered in my medical decision making (see chart for details).  Clinical Course     Patient with Tight ascities. + thrombocytopenia, Will need paracentesis I have given sign out to Dr. Laverta Baltimore.  Final Clinical Impressions(s) / ED Diagnoses   Final diagnoses:  None    New Prescriptions New Prescriptions   No medications on file     Margarita Mail, PA-C 10/26/16 2035    Margette Fast, MD 10/26/16 [phone]

## 2016-10-26 NOTE — Discharge Instructions (Signed)
You were seen in the ED today with abdominal distension and difficulty breathing. We were able to remove approximately 4.5 L of fluid from the abdomen. There was no sign of infection.   Call your PCP tomorrow morning to schedule another session where they can draw more fluid. Return to the ED with any fever, chills, worsening abdominal pain, chest pain, or difficulty breathing.

## 2016-10-26 NOTE — Telephone Encounter (Signed)
Pt left message that he has not heard back regarding his promacta. Will follow up with pharmacy

## 2016-10-27 ENCOUNTER — Telehealth: Payer: Self-pay | Admitting: Pharmacist

## 2016-10-27 NOTE — Telephone Encounter (Signed)
Oral Chemotherapy Pharmacist Encounter  Received notification from Dr. Calton Dach RN that patient has not yet heard from [employer] patient assistance for delivery of his Promacta. We has been told on 12/1 that patient was approved through their foundation to receive free medication through 2018.  I called [facility], medication is processed and ready for shipment.  I called patient to alert him to call [phone] to arrange for shipment of the [place]. They will not ship until they speak to patient.  Patient understands and is in agreement with this plan.  Please reach out to [facility] with any further issues.  Johny Drilling, PharmD, BCPS, BCOP 10/27/2016  9:39 AM [facility]cology Clinic [phone]

## 2016-10-27 NOTE — Telephone Encounter (Signed)
Patient scheduled for an appointment on 12/12. Nat Christen, CMA

## 2016-10-28 LAB — MISC LABCORP TEST (SEND OUT): Labcorp test code: 19497

## 2016-10-28 LAB — PATHOLOGIST SMEAR REVIEW: Path Review: REACTIVE

## 2016-10-31 ENCOUNTER — Emergency Department (HOSPITAL_COMMUNITY)
Admission: EM | Admit: 2016-10-31 | Discharge: 2016-10-31 | Disposition: A | Discharge status: Home / Self Care | Payer: Medicaid Other | Attending: Emergency Medicine | Admitting: Emergency Medicine

## 2016-10-31 ENCOUNTER — Telehealth: Payer: Self-pay | Admitting: *Deleted

## 2016-10-31 ENCOUNTER — Telehealth: Payer: Self-pay | Admitting: Pharmacist

## 2016-10-31 ENCOUNTER — Encounter (HOSPITAL_COMMUNITY): Payer: Self-pay | Admitting: *Deleted

## 2016-10-31 ENCOUNTER — Emergency Department (HOSPITAL_COMMUNITY): Payer: Medicaid Other

## 2016-10-31 DIAGNOSIS — I2510 Atherosclerotic heart disease of native coronary artery without angina pectoris: Secondary | ICD-10-CM | POA: Diagnosis not present

## 2016-10-31 DIAGNOSIS — R188 Other ascites: Secondary | ICD-10-CM | POA: Insufficient documentation

## 2016-10-31 DIAGNOSIS — Z8673 Personal history of transient ischemic attack (TIA), and cerebral infarction without residual deficits: Secondary | ICD-10-CM | POA: Diagnosis not present

## 2016-10-31 DIAGNOSIS — Z96652 Presence of left artificial knee joint: Secondary | ICD-10-CM | POA: Diagnosis not present

## 2016-10-31 DIAGNOSIS — R140 Abdominal distension (gaseous): Secondary | ICD-10-CM | POA: Insufficient documentation

## 2016-10-31 DIAGNOSIS — R0600 Dyspnea, unspecified: Secondary | ICD-10-CM | POA: Insufficient documentation

## 2016-10-31 DIAGNOSIS — I110 Hypertensive heart disease with heart failure: Secondary | ICD-10-CM | POA: Insufficient documentation

## 2016-10-31 DIAGNOSIS — I252 Old myocardial infarction: Secondary | ICD-10-CM | POA: Diagnosis not present

## 2016-10-31 DIAGNOSIS — Z87891 Personal history of nicotine dependence: Secondary | ICD-10-CM | POA: Diagnosis not present

## 2016-10-31 DIAGNOSIS — I5042 Chronic combined systolic (congestive) and diastolic (congestive) heart failure: Secondary | ICD-10-CM | POA: Insufficient documentation

## 2016-10-31 DIAGNOSIS — R1900 Intra-abdominal and pelvic swelling, mass and lump, unspecified site: Secondary | ICD-10-CM | POA: Diagnosis present

## 2016-10-31 HISTORY — DX: Unspecified cirrhosis of liver: K74.60

## 2016-10-31 HISTORY — DX: Heart failure, unspecified: I50.9

## 2016-10-31 LAB — CULTURE, BODY FLUID-BOTTLE: CULTURE: NO GROWTH

## 2016-10-31 LAB — BASIC METABOLIC PANEL
Anion gap: 4 — ABNORMAL LOW (ref 5–15)
BUN: 9 mg/dL (ref 6–20)
CO2: 27 mmol/L (ref 22–32)
Calcium: 8.5 mg/dL — ABNORMAL LOW (ref 8.9–10.3)
Chloride: 105 mmol/L (ref 101–111)
Creatinine, Ser: 0.91 mg/dL (ref 0.61–1.24)
GFR calc Af Amer: >60 mL/min (ref >60)
GFR calc non Af Amer: >60 mL/min (ref >60)
Glucose, Bld: 101 mg/dL — ABNORMAL HIGH (ref 65–99)
Potassium: 3.9 mmol/L (ref 3.5–5.1)
Sodium: 136 mmol/L (ref 135–145)

## 2016-10-31 LAB — CBC
HCT: 34.2 % — ABNORMAL LOW (ref 39.0–52.0)
Hemoglobin: 11.5 g/dL — ABNORMAL LOW (ref 13.0–17.0)
MCH: 31 pg (ref 26.0–34.0)
MCHC: 33.6 g/dL (ref 30.0–36.0)
MCV: 92.2 fL (ref 78.0–100.0)
Platelets: 50 10*3/uL — ABNORMAL LOW (ref 150–400)
RBC: 3.71 MIL/uL — ABNORMAL LOW (ref 4.22–5.81)
RDW: 15.6 % — ABNORMAL HIGH (ref 11.5–15.5)
WBC: 3.5 10*3/uL — ABNORMAL LOW (ref 4.0–10.5)

## 2016-10-31 LAB — I-STAT TROPONIN, ED: Troponin i, poc: 0 ng/mL (ref 0.00–0.08)

## 2016-10-31 LAB — CULTURE, BODY FLUID W GRAM STAIN -BOTTLE

## 2016-10-31 NOTE — Telephone Encounter (Addendum)
Oral Chemotherapy Pharmacist Encounter I received a call from patient on 10/28/16. He stated that he spoke with [employer] and he will receive his medication in the mail tomorrow (11/01/16).  Thank you,  Nuala Alpha, PharmD, [facility] [phone]

## 2016-10-31 NOTE — ED Notes (Signed)
Pilar Plate PA at bedside

## 2016-10-31 NOTE — ED Notes (Signed)
Dr. Kohut at bedside 

## 2016-10-31 NOTE — Telephone Encounter (Signed)
Patient has an appointment tomorrow to be seen with Dr. Juanito Doom but called today stating he just had about 6L of fluid "taken off last week" but feels like it has already returned and is having a hard time breathing.  Patient did sound winded and he was advised to go to the ED.  Patient states that he is short of breath and can barely see his feet.  I did advise him to call someone to drive him after he mentioned he would be taking himself.  Also let him know that he would be better off calling 911 to evaluate him and get him to the ED because driving himself is not the safe option.  Patient voiced understanding and is aware that I will keep his appointment tomorrow on the schedule in case he is able to make it and that I will notify his PCP of this problem.  Jazmin Hartsell,CMA

## 2016-10-31 NOTE — ED Notes (Signed)
Patient transported to X-ray 

## 2016-10-31 NOTE — ED Triage Notes (Signed)
PT is here with sob and swelling all over, especially abdomen.  Pt had fluid drained off his abdomen last week.  No pain

## 2016-10-31 NOTE — ED Notes (Signed)
Dr. Wilson Singer and Pilar Plate PA at bedside

## 2016-10-31 NOTE — Discharge Instructions (Signed)
Please follow-up with your PCP to 5 days. Please follow-up with Dr. Michail Sermon tomorrow.  Get help right away if: You develop a fever. You develop confusion. You develop new or worsening difficulty breathing. You develop new or worsening abdominal pain. You develop new or worsening swelling in the scrotum (in men).

## 2016-10-31 NOTE — ED Provider Notes (Signed)
Medical screening examination/treatment/procedure(s) were conducted as a shared visit with non-physician practitioner(s) and myself.  I personally evaluated the patient during the encounter.   EKG Interpretation None      60yM with dyspnea. Tense ascites on exam. Symptoms likely 2/2 this. Previous paracenteses. On my bedside US he does have a large amount of ascites which I am comfortable tapping at bedside. Will do a paracentesis for therapeutic purposes.    Virgel Manifold, MD 10/31/16 1230

## 2016-10-31 NOTE — ED Provider Notes (Signed)
[facility] DEPT Provider Note   CSN: [id number] Arrival date & time: 10/31/16  1059     History   Chief Complaint Chief Complaint  Patient presents with  . Shortness of Breath  . Leg Swelling    HPI Vincent Ochoa is a 60 y.o. male presenting to the ED with shortness of breath due to tight and swollen abdomen that has increasingly worsened within the last 3 days. Patient reports having fluid drawn off his abdomen last week and a week before that. He states his symptoms are similar to his last 2 visits. Patient states that he had 11 L taken out of his abdomen 2 weeks ago and 4.5 mL last week. Patient admits to associated cough.  He states that his shortness of breath is from his tight swollen abdomen pressing on his lungs. He states that he has a CPAP at home that usually helps with his symptoms but now it has not helped. Patient denies urinary symptoms, changes in bowel movements, fevers, or chills.  The history is provided by the patient.  Shortness of Breath  Associated symptoms include chest pain, abdominal pain and leg swelling. Pertinent negatives include no fever, no vomiting and no rash.    Past Medical History:  Diagnosis Date  . Arthritis    "hands, knees, back" (07/05/2016)  . Back pain, lumbosacral 2014  . CHF (congestive heart failure) ([place])   . Cirrhosis ([place])   . Coronary artery disease    a. Evaluated by Dr. Stanford Breed 2012 - nonobstructive 2007-2008 by cath. b. Abnormal stress test 10/2014 but cath deferred temporarily due to thrombocytopenia. c. 10/2014: readm with AF-RVR/NSTEMI - s/p BMS to mLCx 11/04/14 (mod dz in RCA);  d. relook cath 01/2015 and 06/02/2015 patent stent, nonobs CAD, EF 55-65%.  . Elevated LFTs   . Facial cellulitis 2014   Periorbital  . Fatty liver US - 2012  . Frequent headaches   . GERD (gastroesophageal reflux disease)   . History of blood transfusion 01/2016   "lost alot of blood vomiting"  . Hyperlipidemia   . Morbid obesity ([place])     . Obesity   . OSA on CPAP    severe with AHI 56/hr with successful CPAP titration to 18cm H2O  . PAF (paroxysmal atrial fibrillation) ([place])    a. Episode 10/2014 at time of NSTEMI, spont converted to NSR. Observation for further episodes (not on anticoag at present time due to [facility] = 1, thrombocytopenia).  . Psoriasis   . Sinus bradycardia   . Stroke [facility])    "I've been told I'd had a mini stroke on my left side" (07/05/2016)  . Thrombocytopenia ([place])    a. Onset unclear, but noted 2012. ? 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 s/p prednisone, IVIG.    Patient Active Problem List   Diagnosis Date Noted  . Hematuria, microscopic 10/18/2016  . Rectal bleeding 09/26/2016  . Nephrolithiasis 09/15/2016  . Chronic ITP (idiopathic thrombocytopenia) (HCC) 09/15/2016  . Varices, esophageal ([place]) 09/15/2016  . Essential hypertension   . Hematemesis 06/21/2016  . Chronic combined systolic and diastolic congestive heart failure ([place])   . Erectile dysfunction 06/03/2016  . Cirrhosis of liver with ascites ([place]) 02/25/2016  . Portal vein thrombosis   . Alcoholic cirrhosis of liver with ascites ([place])   . Diastolic heart failure ([place]) 10/07/2015  . Leukopenia 09/15/2015  . Acrochordon 07/18/2015  . PAF (paroxysmal atrial fibrillation) ([place])   . Obese   .  Knee pain, bilateral 03/05/2015  . Depression 01/08/2015  . OSA (obstructive sleep apnea)   . NSTEMI (non-ST elevated myocardial infarction) ([place])   . Back pain, lumbosacral 01/04/2013  . Thrombocytopenia ([place]) 11/29/2012  . GERD (gastroesophageal reflux disease) 08/15/2011  . Coronary artery disease involving native coronary artery of native heart without angina pectoris 07/29/2011  . Psoriasis 07/29/2011    Past Surgical History:  Procedure Laterality Date  . CARDIAC CATHETERIZATION    . CARDIAC CATHETERIZATION N/A 06/02/2015   Procedure: Left Heart Cath and Coronary Angiography;  Surgeon:  Jettie Booze, MD;  Location: [facility] CV LAB;  Service: Cardiovascular;  Laterality: N/A;  . COLONOSCOPY WITH PROPOFOL N/A 09/26/2016   Procedure: COLONOSCOPY WITH PROPOFOL;  Surgeon: Wilford Corner, MD;  Location: [facility] ENDOSCOPY;  Service: Endoscopy;  Laterality: N/A;  . ESOPHAGOGASTRODUODENOSCOPY N/A 06/22/2016   Procedure: ESOPHAGOGASTRODUODENOSCOPY (EGD);  Surgeon: Clarene Essex, MD;  Location: [facility] ENDOSCOPY;  Service: Endoscopy;  Laterality: N/A;  . ESOPHAGOGASTRODUODENOSCOPY (EGD) WITH PROPOFOL N/A 08/17/2016   Procedure: ESOPHAGOGASTRODUODENOSCOPY (EGD) WITH PROPOFOL;  Surgeon: Otis Brace, MD;  Location: [facility];  Service: Gastroenterology;  Laterality: N/A;  . FACIAL COSMETIC SURGERY  1990s   "rodeo-related injury"  . IR GENERIC HISTORICAL  09/14/2016   IR RADIOLOGIST EVAL & MGMT 09/14/2016 Sandi Mariscal, MD [facility] INTERV RAD  . KNEE ARTHROPLASTY Left 1970's   "put cartilage in it"  . LAPAROSCOPIC CHOLECYSTECTOMY  2007  . LEFT HEART CATHETERIZATION WITH CORONARY ANGIOGRAM N/A 11/04/2014   Procedure: LEFT HEART CATHETERIZATION WITH CORONARY ANGIOGRAM;  Surgeon: Jettie Booze, MD;  Location: [facility] CATH LAB;  Service: Cardiovascular;  Laterality: N/A;  . LEFT HEART CATHETERIZATION WITH CORONARY ANGIOGRAM N/A 01/28/2015   Procedure: LEFT HEART CATHETERIZATION WITH CORONARY ANGIOGRAM;  Surgeon: Sherren Mocha, MD;  Location: [facility] CATH LAB;  Service: Cardiovascular;  Laterality: N/A;  . liver cirrhosis    . RADIOLOGY WITH ANESTHESIA N/A 02/04/2016   Procedure: RADIOLOGY WITH ANESTHESIA;  Surgeon: Medication Radiologist, MD;  Location: [facility];  Service: Radiology;  Laterality: N/A;  . TONSILLECTOMY  1960's       Home Medications    Prior to Admission medications   Medication Sig Start Date End Date Taking? Authorizing Provider  atorvastatin (LIPITOR) 40 MG tablet Take 1 tablet (40 mg total) by mouth daily at 8 pm. 06/23/16   Everrett Coombe, MD  buPROPion (WELLBUTRIN XL) 150 MG 24  hr tablet Take 1 tablet (150 mg total) by mouth daily. 08/12/16   Sela Hilding, MD  carvedilol (COREG) 3.125 MG tablet Take 1 tablet (3.125 mg total) by mouth 2 (two) times daily with a meal. 08/12/16   Sela Hilding, MD  eltrombopag (PROMACTA) 25 MG tablet Take 1 tablet (25 mg total) by mouth daily. Take on an empty stomach 1 hour before a meal or 2 hours after Patient not taking: Reported on 10/26/2016 09/21/16   Heath Lark, MD  furosemide (LASIX) 40 MG tablet Take 80 mg by mouth daily.     Historical Provider, MD  isosorbide mononitrate (IMDUR) 30 MG 24 hr tablet Take 30 mg by mouth daily.    Historical Provider, MD  lactulose (CHRONULAC) 10 GM/15ML solution Take 15 mLs (10 g total) by mouth 3 (three) times daily. Patient not taking: Reported on 10/26/2016 08/19/16   Sela Hilding, MD  nitroGLYCERIN (NITROSTAT) 0.4 MG SL tablet Place 1 tablet (0.4 mg total) under the tongue every 5 (five) minutes as needed for chest pain (up to 3 doses). 10/31/14  Dayna N Dunn, PA-C  oxyCODONE-acetaminophen (PERCOCET/ROXICET) 5-325 MG tablet Take 1-2 tablets by mouth every 4 (four) hours as needed for severe pain. Patient not taking: Reported on 10/26/2016 09/15/16   Virginia Crews, MD  pantoprazole (PROTONIX) 40 MG tablet Take 1 tablet (40 mg total) by mouth 2 (two) times daily. 06/03/16   Sela Hilding, MD  spironolactone (ALDACTONE) 25 MG tablet Take 2 tablets (50 mg total) by mouth daily. 10/14/16   Ascencion Dike, PA-C  sucralfate (CARAFATE) 1 g tablet Take 1 tablet (1 g total) by mouth 4 (four) times daily -  with meals and at bedtime. Dissolve in water. Patient not taking: Reported on 10/26/2016 08/19/16   Sela Hilding, MD    Family History Family History  Problem Relation Age of Onset  . Cirrhosis Mother   . Heart attack Mother   . Hypertension Mother   . Hypertension Father   . Stroke Father   . Arthritis Maternal Grandmother   . Heart disease Sister   . Diabetes Sister     . Cancer - Ovarian Sister   . Heart disease      Maternal/paternal grandparents  . Heart attack Maternal Aunt     Social History Social History  Substance Use Topics  . Smoking status: Former Smoker    Packs/day: 1.50    Years: 43.00    Types: Cigarettes    Quit date: 02/19/2016  . Smokeless tobacco: Former Systems developer  . Alcohol use 0.0 oz/week     Comment: 07/05/2016 "mostly stopped in 1979; might have a beer q 6 months or so"     Allergies   Asa [aspirin]; Eliquis [apixaban]; Nsaids; Plavix [clopidogrel]; and Statins   Review of Systems Review of Systems  Constitutional: Negative for chills and fever.  Respiratory: Positive for shortness of breath.   Cardiovascular: Positive for chest pain and leg swelling.  Gastrointestinal: Positive for abdominal distention and abdominal pain. Negative for constipation, diarrhea, nausea and vomiting.  Endocrine: Negative for polyuria.  Genitourinary: Negative for difficulty urinating and dysuria.  Musculoskeletal: Negative for back pain.  Skin: Negative for rash and wound.  Neurological: Negative for speech difficulty.  All other systems reviewed and are negative.    Physical Exam Updated Vital Signs BP 123/77   Pulse 85   Temp 98.7 F (37.1 C) (Oral)   Resp 15   SpO2 100%   Physical Exam  Constitutional: He is oriented to person, place, and time. He appears well-developed and well-nourished.  HENT:  Head: Normocephalic and atraumatic.  Nose: Nose normal.  Eyes: Conjunctivae and EOM are normal. Pupils are equal, round, and reactive to light.  Neck: Normal range of motion. Neck supple.  Cardiovascular: Normal rate and normal heart sounds.   Pulmonary/Chest: Effort normal and breath sounds normal. No respiratory distress. He exhibits no tenderness.  Abdominal: Soft. Bowel sounds are normal. He exhibits distension. There is no tenderness. There is no rebound and no guarding.  Very tight and distended.   Musculoskeletal: Normal  range of motion.  Neurological: He is alert and oriented to person, place, and time.  Skin: Skin is warm. Capillary refill takes less than 2 seconds.  Psychiatric: He has a normal mood and affect. His behavior is normal.  Nursing note and vitals reviewed.    ED Treatments / Results  Labs (all labs ordered are listed, but only abnormal results are displayed) Labs Reviewed  BASIC METABOLIC PANEL - Abnormal; Notable for the following:  Result Value   Glucose, Bld 101 (*)    Calcium 8.5 (*)    Anion gap 4 (*)    All other components within normal limits  CBC - Abnormal; Notable for the following:    WBC 3.5 (*)    RBC 3.71 (*)    Hemoglobin 11.5 (*)    HCT 34.2 (*)    RDW 15.6 (*)    Platelets 50 (*)    All other components within normal limits  I-STAT TROPOININ, ED    EKG  EKG Interpretation None       Radiology Dg Chest 2 View  Result Date: 10/31/2016 CLINICAL DATA:  Shortness of breath, swelling all over. EXAM: CHEST  2 VIEW COMPARISON:  10/26/2016 FINDINGS: Low lung volumes with bibasilar opacities, likely atelectasis. Heart is normal size. No effusions or acute bony abnormality. IMPRESSION: Low lung volumes, bibasilar atelectasis. Electronically Signed   By: Rolm Baptise M.D.   On: 10/31/2016 11:56    Procedures .Paracentesis Date/Time: 10/31/2016 3:03 PM Performed by: Bettey Costa Authorized by: Bettey Costa   Consent:    Consent obtained:  Verbal   Consent given by:  Patient   Risks discussed:  Bleeding, bowel perforation, infection and pain   Alternatives discussed:  No treatment and delayed treatment Pre-procedure details:    Procedure purpose:  Therapeutic   Preparation: Patient was prepped and draped in usual sterile fashion   Anesthesia (see MAR for exact dosages):    Anesthesia method:  Local infiltration   Local anesthetic:  Lidocaine 1% w/o epi Procedure details:    Needle gauge:  18   Ultrasound guidance: yes      Puncture site:  R lower quadrant   Fluid removed amount:  8 L   Fluid appearance:  Amber   Dressing:  Adhesive bandage Post-procedure details:    Patient tolerance of procedure:  Tolerated well, no immediate complications   (including critical care time)  Medications Ordered in ED Medications - No data to display   Initial Impression / Assessment and Plan / ED Course  I have reviewed the triage vital signs and the nursing notes.  Pertinent labs & imaging results that were available during my care of the patient were reviewed by me and considered in my medical decision making (see chart for details).  Clinical Course   Patient is a 60 year old male presenting for evaluation of dyspnea due to severe abdominal distention from large ascites. He recently had a large volume paracentesis last week 4.5 L removed and a week before that at 11 L removed. Patient unable to make appointment and came straight to the ED to get it drained as he has not passed. EKG showed no acute findings. Lab work consistent with previous findings. Troponin is negative and x-ray also shows no acute findings. Patient states his abdomen is not painful just very tight and distended. Low suspicion for spontaneous bacterial peritonitis at this time. Large volume paracentesis performed with Dr. Wilson Singer in the ED for therapeutic effect. He should and states that he will call his primary care physician and his GI physician to follow-up. 8 L removed during paracentesis. Patient feels better and would like discharge. Patient understood the instructions. Return precautions given.  Final Clinical Impressions(s) / ED Diagnoses   Final diagnoses:  Dyspnea, unspecified type  Other ascites    New Prescriptions Discharge Medication List as of 10/31/2016  4:03 PM       [place], [place] 10/31/16 2006  Virgel Manifold, MD 11/01/16 2134

## 2016-10-31 NOTE — ED Notes (Signed)
Pilar Plate Pa made aware that the pt would like "Dr. Earleen Newport" called.

## 2016-11-01 ENCOUNTER — Telehealth: Payer: Self-pay | Admitting: *Deleted

## 2016-11-01 ENCOUNTER — Ambulatory Visit (INDEPENDENT_AMBULATORY_CARE_PROVIDER_SITE_OTHER): Payer: Medicaid Other | Admitting: Family Medicine

## 2016-11-01 DIAGNOSIS — I5032 Chronic diastolic (congestive) heart failure: Secondary | ICD-10-CM | POA: Diagnosis not present

## 2016-11-01 DIAGNOSIS — K7031 Alcoholic cirrhosis of liver with ascites: Secondary | ICD-10-CM | POA: Diagnosis not present

## 2016-11-01 MED ORDER — FUROSEMIDE 40 MG PO TABS: 20.0000 mg | ORAL_TABLET | Freq: Two times a day (BID) | ORAL | 1 refills | Status: DC

## 2016-11-01 MED ORDER — SPIRONOLACTONE 25 MG PO TABS: 50.0000 mg | ORAL_TABLET | Freq: Every day | ORAL | 2 refills | Status: DC

## 2016-11-01 MED ORDER — BUPROPION HCL ER (XL) 150 MG PO TB24: 150.0000 mg | ORAL_TABLET | Freq: Every day | ORAL | 3 refills | Status: DC

## 2016-11-01 MED ORDER — PANTOPRAZOLE SODIUM 40 MG PO TBEC: 40.0000 mg | DELAYED_RELEASE_TABLET | Freq: Two times a day (BID) | ORAL | 2 refills | Status: DC

## 2016-11-01 MED ORDER — CARVEDILOL 3.125 MG PO TABS: 3.1250 mg | ORAL_TABLET | Freq: Two times a day (BID) | ORAL | 0 refills | Status: DC

## 2016-11-01 MED ORDER — ISOSORBIDE MONONITRATE ER 30 MG PO TB24: 30.0000 mg | ORAL_TABLET | Freq: Every day | ORAL | 1 refills | Status: DC

## 2016-11-01 NOTE — Patient Instructions (Addendum)
Thank you for coming in today, it was so nice to see you! Today we talked about:    Lasix: Please take 20 mg of Lasix in the morning and 20 mg at night  I refilled all your medications today  If you feel more short of breath or have trouble breathing, please go to the hospital  He will need to be seen in the clinic on Monday, December 18 to check your blood pressure and see if we need to change your Lasix dose. You can see any provider who is available. You can schedule this appointment at the front desk before you leave or call the clinic.  Bring in all your medications or supplements to each appointment for review.    If you have any questions or concerns, please do not hesitate to call the office at [phone]. You can also message me directly via MyChart.   Sincerely,  Smitty Cords, MD

## 2016-11-01 NOTE — Assessment & Plan Note (Signed)
Stable. Refilled all patient's cardiac medications. -Continue to follow cardiology

## 2016-11-01 NOTE — Telephone Encounter (Signed)
Pt called states has not received Promacta yet.  He thought it is supposed to be delivered today.  I forwarded his call to [facility] extension.  They have been working on getting this medication for pt.Marland Kitchen

## 2016-11-01 NOTE — Assessment & Plan Note (Addendum)
Uncontrolled. Follows with GI. Went to the ED yesterday and had about 8 L of fluid removed. He has had paracentesis once weekly for the past 3 weeks. He notes that he only occasionally drinks alcohol did not quantify. Has not had his Lasix last couple weeks as he has been out.  -Provided prescription for Lasix, will do 20 mg in the morning and at night -Follow-up in one week to reassess ascites, blood pressure to ensure no hypotension, and check kidney function with BMP -Consider increasing Lasix if ascites is not stable and if kidney function still normal  -Continue to follow with [facility]

## 2016-11-01 NOTE — Progress Notes (Signed)
   Subjective:    Patient ID: Vincent Ochoa , male   DOB: 23-Nov-1955 , 60 y.o..   MRN: [id number]  HPI  Vincent Ochoa is a 60 yo male who hx of alcoholic cirrhosis w/ ascites, CHF here for  Chief Complaint  Patient presents with  . Follow-up   Cardiac: Follows with Dr. Burt Knack, last time he saw him was 2 months ago. He states that he needs his Lasix in order to get the fluid off. He states that his cardiologist wants him on Lasix. He hasn't been on Lasix for the last couple weeks. He notes that the Lasix medication has been held in the past due to asymptomatic hypotension. Denies any chest pain, palpitations, leg swelling.  Ascites: Pt has a hx of alcoholic cirrosis but he doesn't understand how because he doesn't drink a lot a lot of alcohol. He has multiple paracentesis: 8.5 L yesterday, 4.4 L off last week and 11.7 L the week before that. He states he feel much better now and is not having any shortness of breath.   Review of Systems: Per HPI. All other systems reviewed and are negative.  Social Hx:  reports that he quit smoking about 8 months ago. His smoking use included Cigarettes. He has a 64.50 pack-year smoking history. He has quit using smokeless tobacco.   Objective:   BP 118/66 (BP Location: Left Arm, Patient Position: Sitting, Cuff Size: Large)   Pulse 92   Temp 98.4 F (36.9 C) (Oral)   Ht 6\' 3"  (1.905 m)   Wt 289 lb 12.8 oz (131.5 kg)   SpO2 98%   BMI 36.22 kg/m  Physical Exam  Gen: NAD, alert, cooperative with exam, well-appearing, obese HEENT: NCAT, PERRL, clear conjunctiva, oropharynx clear, supple neck, nonicteric sclera Cardiac: Regular rate and rhythm, normal S1/S2 Respiratory: Clear to auscultation bilaterally, no wheezes, non-labored breathing Gastrointestinal: soft, non tender, distended, bowel sounds present, positive fluid wave Skin: no rashes, normal turgor  Neurological: no gross deficits.  Psych: good insight, normal mood and affect  Assessment &  Plan:  Alcoholic cirrhosis of liver with ascites (HCC) Uncontrolled. Follows with GI. Went to the ED yesterday and had about 8 L of fluid removed. He has had paracentesis once weekly for the past 3 weeks. He notes that he only occasionally drinks alcohol did not quantify. Has not had his Lasix last couple weeks as he has been out.  -Provided prescription for Lasix, will do 20 mg in the morning and at night -Follow-up in one week to reassess ascites, blood pressure to ensure no hypotension, and check kidney function with BMP -Consider increasing Lasix if ascites is not stable and if kidney function still normal  -Continue to follow with [facility]  Diastolic heart failure (HCC) Stable. Refilled all patient's cardiac medications. -Continue to follow cardiology   Smitty Cords, MD [place], PGY-2

## 2016-11-02 ENCOUNTER — Telehealth: Payer: Self-pay | Admitting: *Deleted

## 2016-11-02 NOTE — Telephone Encounter (Signed)
agree

## 2016-11-02 NOTE — Telephone Encounter (Signed)
Pt left a message stating he has received his promacta. Wanted to know if he should start now or wait until he sees Dr Alvy Bimler.   LM for patient to go ahead and start today 1 hour before a meal or 2 hours after a meal. OK to start tomorrow in the morning if he prefers.

## 2016-11-07 ENCOUNTER — Ambulatory Visit (INDEPENDENT_AMBULATORY_CARE_PROVIDER_SITE_OTHER): Payer: Medicaid Other | Admitting: Family Medicine

## 2016-11-07 ENCOUNTER — Encounter: Payer: Self-pay | Admitting: Family Medicine

## 2016-11-07 VITALS — BP 118/76 | HR 75 | Temp 98.0°F | Ht 75.0 in | Wt 295.0 lb

## 2016-11-07 DIAGNOSIS — K7031 Alcoholic cirrhosis of liver with ascites: Secondary | ICD-10-CM

## 2016-11-07 NOTE — Progress Notes (Signed)
Subjective: CC: ascites HPI: Vincent Ochoa is a 60 y.o. male presenting to clinic today for follow up. Concerns today include:  1. Cirrhosis Patient reports increased fluid accumulation since last paracentesis.  Though he reports that it is still manageable at this time.  He reports that he knows when he needs to have draining done when he is SOB with ambulation to the mailbox.  He is not currently having this.  He reports that his first episode of ascites was earlier this year after his surgery.    He notes that he is unable to get an appointment with GI, Dr Michail Sermon, until after the new year.  Patient was seen 12/12 and had Lasix refilled.  He notes that he has been urinating frequently with the medication, though not sure that sufficient fluid is coming off, as his belly is still big.  He reports he tries to stay away from salty foods.  He states that he has to eat some processed items, as that is what he can afford.  He does, however, try to incorporate as much fresh food as possible.  Additionally, he reports he has not been drinking, as he realizes that this affects his health.  He also is no longer smoking.   He reports that his liver surgeon, Dr Pascal Lux Dr Boykin Nearing, mentioned possible shunt surgery.  He has not further discussed this.  He did speak to his GI provider about possible tube placement for permanent drainage of his ascites.  He was told he was not a candidate for this.  He has not discussed GOC with his wife or other providers.  Denies fevers, chills, nausea, vomiting, SOB.  Endorses early satiety and intermittent discomfort with ascites when it is "bad".  Social History Reviewed: former smoker. FamHx and MedHx reviewed.  Please see EMR.  ROS: Per HPI  Objective: Office vital signs reviewed. BP 118/76   Pulse 75   Temp 98 F (36.7 C) (Oral)   Ht 6\' 3"  (1.905 m)   Wt 295 lb (133.8 kg)   SpO2 95%   BMI 36.87 kg/m   Physical Examination:  General: Awake, alert,  No acute distress Cardio: regular rate and rhythm, S1S2 heard, no murmurs appreciated Pulm: clear to auscultation bilaterally, no wheezes, rhonchi or rales; normal work of breathing on room air GI: distended, nontender, +BS. Flank dullness appreciated.  NO spider angiomata seen. Extremities: warm, well perfused, No edema, cyanosis or clubbing  Assessment/ Plan: 60 y.o. male   1. Ascites due to alcoholic cirrhosis ([place]).  I am concerned that patient has had an increased need for paracentesis over the last month.  He has gone almost weekly to have fluid drained.  We had a long discussion today with regards to Goals of care.  His MELD score based on most recent lab values is 13.  He appears to be plugged in with GI and surgery.  He asked that I continue discussion with his wife on the phone, which I was happy to oblige.  We discussed at length my concern for his rapid accumulation in the setting of compliance with diet, medication and ETOH cessation.  So far, he has been able to go almost a week without need for paracentesis.  However, based on his exam, I suspect he will need one soon.  I recommend that he follow up with his hepatologist and liver surgeons (who he notes can continue paracentesis) to discuss scheduling these for him so that he is not having  to go to the ED.  At this time, patient is meeting criteria for hospice consideration, w/ a recent PT of 17 and possible refractory ascites.  This was mentioned during his appointment.  He does not appear decompensated and his MELD score reflects a 3 month mortality of 6%.  I recommended that he schedule appointments with his specialists soon to continue to discuss his options given recurrent ascites.  I will also cc: his PCP to further investigate what he needs to have his paracentesis scheduled if needed.  He has a copy of HPOA and Living will in chart.  Continue current therapies for now.  Consider increasing Spironolactone if BP tolerates, as this has  better intraabdominal effects than lasix.  I recommend that patient follows up with PCP in next week or 2 to continue discussion.   Total time spent with patient 27 minutes.  Greater than 50% of encounter spent in coordination of care/counseling.   Janora Norlander, DO PGY-3, [facility]

## 2016-11-09 ENCOUNTER — Telehealth: Payer: Self-pay | Admitting: *Deleted

## 2016-11-09 ENCOUNTER — Telehealth: Payer: Self-pay | Admitting: Family Medicine

## 2016-11-09 ENCOUNTER — Emergency Department (HOSPITAL_COMMUNITY)
Admission: EM | Admit: 2016-11-09 | Discharge: 2016-11-09 | Disposition: A | Discharge status: Home / Self Care | Payer: Medicaid Other | Attending: Emergency Medicine | Admitting: Emergency Medicine

## 2016-11-09 ENCOUNTER — Encounter (HOSPITAL_COMMUNITY): Payer: Self-pay | Admitting: Neurology

## 2016-11-09 ENCOUNTER — Other Ambulatory Visit: Payer: Self-pay

## 2016-11-09 DIAGNOSIS — I110 Hypertensive heart disease with heart failure: Secondary | ICD-10-CM | POA: Insufficient documentation

## 2016-11-09 DIAGNOSIS — Z8673 Personal history of transient ischemic attack (TIA), and cerebral infarction without residual deficits: Secondary | ICD-10-CM | POA: Insufficient documentation

## 2016-11-09 DIAGNOSIS — R188 Other ascites: Secondary | ICD-10-CM | POA: Diagnosis present

## 2016-11-09 DIAGNOSIS — I252 Old myocardial infarction: Secondary | ICD-10-CM | POA: Insufficient documentation

## 2016-11-09 DIAGNOSIS — Z87891 Personal history of nicotine dependence: Secondary | ICD-10-CM | POA: Diagnosis not present

## 2016-11-09 DIAGNOSIS — Z79899 Other long term (current) drug therapy: Secondary | ICD-10-CM | POA: Insufficient documentation

## 2016-11-09 DIAGNOSIS — I2510 Atherosclerotic heart disease of native coronary artery without angina pectoris: Secondary | ICD-10-CM | POA: Insufficient documentation

## 2016-11-09 DIAGNOSIS — Z96652 Presence of left artificial knee joint: Secondary | ICD-10-CM | POA: Insufficient documentation

## 2016-11-09 DIAGNOSIS — I5042 Chronic combined systolic (congestive) and diastolic (congestive) heart failure: Secondary | ICD-10-CM | POA: Insufficient documentation

## 2016-11-09 LAB — COMPREHENSIVE METABOLIC PANEL
ALK PHOS: 104 U/L (ref 38–126)
ALT: 52 U/L (ref 17–63)
AST: 81 U/L — AB (ref 15–41)
Albumin: 2.6 g/dL — ABNORMAL LOW (ref 3.5–5.0)
Anion gap: 6 (ref 5–15)
BUN: 14 mg/dL (ref 6–20)
CALCIUM: 8.6 mg/dL — AB (ref 8.9–10.3)
CO2: 27 mmol/L (ref 22–32)
CREATININE: 1.23 mg/dL (ref 0.61–1.24)
Chloride: 106 mmol/L (ref 101–111)
GFR calc non Af Amer: >60 mL/min (ref >60)
GLUCOSE: 98 mg/dL (ref 65–99)
Potassium: 3.7 mmol/L (ref 3.5–5.1)
SODIUM: 139 mmol/L (ref 135–145)
Total Bilirubin: 1.8 mg/dL — ABNORMAL HIGH (ref 0.3–1.2)
Total Protein: 6.9 g/dL (ref 6.5–8.1)

## 2016-11-09 LAB — CBC
HCT: 34.7 % — ABNORMAL LOW (ref 39.0–52.0)
Hemoglobin: 11.5 g/dL — ABNORMAL LOW (ref 13.0–17.0)
MCH: 30.6 pg (ref 26.0–34.0)
MCHC: 33.1 g/dL (ref 30.0–36.0)
MCV: 92.3 fL (ref 78.0–100.0)
PLATELETS: 55 10*3/uL — AB (ref 150–400)
RBC: 3.76 MIL/uL — ABNORMAL LOW (ref 4.22–5.81)
RDW: 15.7 % — AB (ref 11.5–15.5)
WBC: 4.2 10*3/uL (ref 4.0–10.5)

## 2016-11-09 NOTE — Telephone Encounter (Signed)
If patient is having issue breathing he should be seen in the ED today.  Vincent Ochoa is seeing patient's in clinic and can not call patient back at this moment.  Will forward chart to [facility].  Derl Barrow, RN

## 2016-11-09 NOTE — Telephone Encounter (Signed)
Pt's wife would like to speak with Dr. Lajuana Ripple ASAP, pt is filling up with fluid again and issues breathing. Pt's wife also has a few other questions about appointment the other day. Please advise. Thanks! ep

## 2016-11-09 NOTE — ED Provider Notes (Signed)
[facility] DEPT Provider Note   CSN: [id number] Arrival date & time: 11/09/16  1750     History   Chief Complaint Chief Complaint  Patient presents with  . Ascites   HPI  Blood pressure 124/73, pulse 73, temperature 97.4 F (36.3 C), temperature source Oral, resp. rate 18, SpO2 98 %.  Vincent Ochoa is a 60 y.o. male complaining of Increasing abdominal girth and discomfort with shortness of breath worsening over the course of the last 72 hours. States his ascites is increasing, he normally comes to the emergency department to have his taps. Last Paracentesis 12/11. He denies any chest pain, abdominal pain, fever, chills, nausea, vomiting.    Past Medical History:  Diagnosis Date  . Arthritis    "hands, knees, back" (07/05/2016)  . Back pain, lumbosacral 2014  . CHF (congestive heart failure) ([place])   . Cirrhosis ([place])   . Coronary artery disease    a. Evaluated by Dr. Stanford Breed 2012 - nonobstructive 2007-2008 by cath. b. Abnormal stress test 10/2014 but cath deferred temporarily due to thrombocytopenia. c. 10/2014: readm with AF-RVR/NSTEMI - s/p BMS to mLCx 11/04/14 (mod dz in RCA);  d. relook cath 01/2015 and 06/02/2015 patent stent, nonobs CAD, EF 55-65%.  . Elevated LFTs   . Facial cellulitis 2014   Periorbital  . Fatty liver US - 2012  . Frequent headaches   . GERD (gastroesophageal reflux disease)   . History of blood transfusion 01/2016   "lost alot of blood vomiting"  . Hyperlipidemia   . Morbid obesity ([place])   . Obesity   . OSA on CPAP    severe with AHI 56/hr with successful CPAP titration to 18cm H2O  . PAF (paroxysmal atrial fibrillation) ([place])    a. Episode 10/2014 at time of NSTEMI, spont converted to NSR. Observation for further episodes (not on anticoag at present time due to [facility] = 1, thrombocytopenia).  . Psoriasis   . Sinus bradycardia   . Stroke [facility])    "I've been told I'd had a mini stroke on my left side" (07/05/2016)  . Thrombocytopenia  ([place])    a. Onset unclear, but noted 2012. ? 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 s/p prednisone, IVIG.    Patient Active Problem List   Diagnosis Date Noted  . Hematuria, microscopic 10/18/2016  . Rectal bleeding 09/26/2016  . Nephrolithiasis 09/15/2016  . Chronic ITP (idiopathic thrombocytopenia) (H[facility]) 09/15/2016  . Varices, esophageal ([place]) 09/15/2016  . Essential hypertension   . Hematemesis 06/21/2016  . Chronic combined systolic and diastolic congestive heart failure ([place])   . Erectile dysfunction 06/03/2016  . Cirrhosis of liver with ascites ([place]) 02/25/2016  . Portal vein thrombosis   . Alcoholic cirrhosis of liver with ascites ([place])   . Diastolic heart failure ([place]) 10/07/2015  . Leukopenia 09/15/2015  . Acrochordon 07/18/2015  . PAF (paroxysmal atrial fibrillation) ([place])   . Obese   . Knee pain, bilateral 03/05/2015  . Depression 01/08/2015  . OSA (obstructive sleep apnea)   . NSTEMI (non-ST elevated myocardial infarction) ([place])   . Back pain, lumbosacral 01/04/2013  . Thrombocytopenia ([place]) 11/29/2012  . GERD (gastroesophageal reflux disease) 08/15/2011  . Coronary artery disease involving native coronary artery of native heart without angina pectoris 07/29/2011  . Psoriasis 07/29/2011    Past Surgical History:  Procedure Laterality Date  . CARDIAC CATHETERIZATION    . CARDIAC CATHETERIZATION N/A 06/02/2015   Procedure: Left Heart Cath and Coronary Angiography;  Surgeon: Jettie Booze, MD;  Location: [facility] CV LAB;  Service: Cardiovascular;  Laterality: N/A;  . COLONOSCOPY WITH PROPOFOL N/A 09/26/2016   Procedure: COLONOSCOPY WITH PROPOFOL;  Surgeon: Wilford Corner, MD;  Location: [facility] ENDOSCOPY;  Service: Endoscopy;  Laterality: N/A;  . ESOPHAGOGASTRODUODENOSCOPY N/A 06/22/2016   Procedure: ESOPHAGOGASTRODUODENOSCOPY (EGD);  Surgeon: Clarene Essex, MD;  Location: [facility] ENDOSCOPY;  Service: Endoscopy;  Laterality: N/A;  .  ESOPHAGOGASTRODUODENOSCOPY (EGD) WITH PROPOFOL N/A 08/17/2016   Procedure: ESOPHAGOGASTRODUODENOSCOPY (EGD) WITH PROPOFOL;  Surgeon: Otis Brace, MD;  Location: [facility];  Service: Gastroenterology;  Laterality: N/A;  . FACIAL COSMETIC SURGERY  1990s   "rodeo-related injury"  . IR GENERIC HISTORICAL  09/14/2016   IR RADIOLOGIST EVAL & MGMT 09/14/2016 Sandi Mariscal, MD [facility] INTERV RAD  . KNEE ARTHROPLASTY Left 1970's   "put cartilage in it"  . LAPAROSCOPIC CHOLECYSTECTOMY  2007  . LEFT HEART CATHETERIZATION WITH CORONARY ANGIOGRAM N/A 11/04/2014   Procedure: LEFT HEART CATHETERIZATION WITH CORONARY ANGIOGRAM;  Surgeon: Jettie Booze, MD;  Location: [facility] CATH LAB;  Service: Cardiovascular;  Laterality: N/A;  . LEFT HEART CATHETERIZATION WITH CORONARY ANGIOGRAM N/A 01/28/2015   Procedure: LEFT HEART CATHETERIZATION WITH CORONARY ANGIOGRAM;  Surgeon: Sherren Mocha, MD;  Location: [facility] CATH LAB;  Service: Cardiovascular;  Laterality: N/A;  . liver cirrhosis    . RADIOLOGY WITH ANESTHESIA N/A 02/04/2016   Procedure: RADIOLOGY WITH ANESTHESIA;  Surgeon: Medication Radiologist, MD;  Location: [facility];  Service: Radiology;  Laterality: N/A;  . TONSILLECTOMY  1960's       Home Medications    Prior to Admission medications   Medication Sig Start Date End Date Taking? Authorizing Provider  atorvastatin (LIPITOR) 40 MG tablet Take 1 tablet (40 mg total) by mouth daily at 8 pm. Patient taking differently: Take 40 mg by mouth daily.  06/23/16  Yes Everrett Coombe, MD  buPROPion (WELLBUTRIN XL) 150 MG 24 hr tablet Take 1 tablet (150 mg total) by mouth daily. 11/01/16  Yes Carlyle Dolly, MD  carvedilol (COREG) 3.125 MG tablet Take 1 tablet (3.125 mg total) by mouth 2 (two) times daily with a meal. 11/01/16  Yes Carlyle Dolly, MD  eltrombopag (PROMACTA) 25 MG tablet Take 1 tablet (25 mg total) by mouth daily. Take on an empty stomach 1 hour before a meal or 2 hours after 09/21/16  Yes Heath Lark, MD  furosemide (LASIX) 40 MG tablet Take 0.5 tablets (20 mg total) by mouth 2 (two) times daily. Patient taking differently: Take 40 mg by mouth 2 (two) times daily.  11/01/16  Yes Carlyle Dolly, MD  isosorbide mononitrate (IMDUR) 30 MG 24 hr tablet Take 1 tablet (30 mg total) by mouth daily. 11/01/16  Yes Carlyle Dolly, MD  nitroGLYCERIN (NITROSTAT) 0.4 MG SL tablet Place 1 tablet (0.4 mg total) under the tongue every 5 (five) minutes as needed for chest pain (up to 3 doses). 10/31/14  Yes Dayna N Dunn, PA-C  oxyCODONE-acetaminophen (PERCOCET/ROXICET) 5-325 MG tablet Take 1-2 tablets by mouth every 4 (four) hours as needed for severe pain. 09/15/16  Yes Virginia Crews, MD  pantoprazole (PROTONIX) 40 MG tablet Take 1 tablet (40 mg total) by mouth 2 (two) times daily. 11/01/16  Yes Carlyle Dolly, MD  spironolactone (ALDACTONE) 25 MG tablet Take 2 tablets (50 mg total) by mouth daily. 11/01/16  Yes Carlyle Dolly, MD  sucralfate (CARAFATE) 1 g tablet Take 1 tablet (1 g total) by mouth 4 (four) times  daily -  with meals and at bedtime. Dissolve in water. Patient taking differently: Take 1 g by mouth 3 (three) times daily as needed (stomach irritation). Dissolve in water. 08/19/16  Yes Sela Hilding, MD    Family History Family History  Problem Relation Age of Onset  . Cirrhosis Mother   . Heart attack Mother   . Hypertension Mother   . Hypertension Father   . Stroke Father   . Arthritis Maternal Grandmother   . Heart disease Sister   . Diabetes Sister   . Cancer - Ovarian Sister   . Heart disease      Maternal/paternal grandparents  . Heart attack Maternal Aunt     Social History Social History  Substance Use Topics  . Smoking status: Former Smoker    Packs/day: 1.50    Years: 43.00    Types: Cigarettes    Quit date: 02/19/2016  . Smokeless tobacco: Former Systems developer  . Alcohol use 0.0 oz/week     Comment: 07/05/2016 "mostly stopped in 1979; might  have a beer q 6 months or so"     Allergies   Asa [aspirin]; Eliquis [apixaban]; Nsaids; Plavix [clopidogrel]; and Statins   Review of Systems Review of Systems  10 systems reviewed and found to be negative, except as noted in the HPI.   Physical Exam Updated Vital Signs BP 112/66   Pulse 77   Temp 97.4 F (36.3 C) (Oral)   Resp 13   SpO2 99%   Physical Exam  Constitutional: He is oriented to person, place, and time. He appears well-developed and well-nourished. No distress.  HENT:  Head: Normocephalic and atraumatic.  Mouth/Throat: Oropharynx is clear and moist.  Eyes: Conjunctivae and EOM are normal. Pupils are equal, round, and reactive to light.  Neck: Normal range of motion.  Cardiovascular: Normal rate, regular rhythm and intact distal pulses.   Pulmonary/Chest: Effort normal and breath sounds normal.  Abdominal: Soft. He exhibits distension. He exhibits no mass. There is no tenderness. There is no rebound and no guarding. No hernia.  Significant protuberant abdomen, no significant tenderness to palpation.  Musculoskeletal: Normal range of motion.  Neurological: He is alert and oriented to person, place, and time. He displays normal reflexes.  Skin: He is not diaphoretic.  Psychiatric: He has a normal mood and affect.  Nursing note and vitals reviewed.    ED Treatments / Results  Labs (all labs ordered are listed, but only abnormal results are displayed) Labs Reviewed  COMPREHENSIVE METABOLIC PANEL - Abnormal; Notable for the following:       Result Value   Calcium 8.6 (*)    Albumin 2.6 (*)    AST 81 (*)    Total Bilirubin 1.8 (*)    All other components within normal limits  CBC - Abnormal; Notable for the following:    RBC 3.76 (*)    Hemoglobin 11.5 (*)    HCT 34.7 (*)    RDW 15.7 (*)    Platelets 55 (*)    All other components within normal limits    EKG  EKG Interpretation None       Radiology No results  found.  Procedures Procedures (including critical care time)  Medications Ordered in ED Medications - No data to display   Initial Impression / Assessment and Plan / ED Course  I have reviewed the triage vital signs and the nursing notes.  Pertinent labs & imaging results that were available during my care of the patient  were reviewed by me and considered in my medical decision making (see chart for details).  Clinical Course     Vitals:   11/09/16 1930 11/09/16 1945 11/09/16 2000 11/09/16 2015  BP: 127/73 112/65 126/74 112/66  Pulse: 72 72 71 77  Resp: 14 16 14 13   Temp:      TempSrc:      SpO2: 98% 99% 98% 99%     BENTLEIGH KRENGEL is 60 y.o. male presenting with Increasing abdominal girth discomfort with associated shortness of breath, patient is cirrhotic and has multiple therapeutic taps. No fever, pain to suggest SBP. Vital signs stable, abdominal exam nonsurgical. Blood work typical for his baseline with platelets of 55.  Discussed case with family practice resident Dr. Lindell Noe, she states that she saw the patient on Monday and try to set up outpatient. Recommends that we try to obtain interventional radiology to perform the tap emergently in the ED tonight.  Interventional radiology consult from Dr. Laurence Ferrari appreciated: Declines emergent for sound guided paracentesis. States that he can probably be worked into the schedule tomorrow. Also recommends that this patient follow-up in IR clinic for possible TIPS if he is needing very frequent paracenteses.  Case with attending who given his low platelet count agrees that Is better performed by interventional radiology, no dictation for emergent tap tonight. Discussed with family practice upper level resident who declines admission for this patient to obtain tap in the morning, I do believe this this patient is stable to go home tonight, family practice will contact him tomorrow to set up an outpatient paracentesis.  Patient  and wife via speaker phone advised of plan to discharge to home he is extremely upset stating that he always gets his taps in the emergency department and that we are not doing anything for him states he will call his lawyer. I've advised him family practice will call to set up an appointment tomorrow and he is welcome to return to the ED at any time if his symptoms change. Discussed with the attending physician who agrees that patient stable for discharge to home, no further emergent workup is indicated at this time.  Evaluation does not show pathology that would require ongoing emergent intervention or inpatient treatment. Pt is hemodynamically stable and mentating appropriately. All questions answered. Return precautions discussed and outpatient follow up given.    Final Clinical Impressions(s) / ED Diagnoses   Final diagnoses:  Other ascites      Monico Blitz, PA-C 11/09/16 2024    Monico Blitz, PA-C 11/09/16 2025    Monico Blitz, PA-C 11/09/16 2027    Julianne Rice, MD 11/10/16 [phone]

## 2016-11-09 NOTE — ED Triage Notes (Signed)
Pt here with cirrhosis and ascites. Has been having to get his abdomen drained weekly. The swelling is causing him to be SOB. Pt denies CP.

## 2016-11-09 NOTE — Discharge Instructions (Signed)
Your primary care physician will contact you tomorrow to arrange an outpatient therapeutic paracentesis.  Do not hesitate to return to the emergency department for any new, worsening or concerning symptoms.

## 2016-11-09 NOTE — ED Notes (Signed)
Unable to get signature for discharge.  Initially refused then unable to get sig pad to work.  Patient noted to be frustrated with plan of care.  Explained several times to patient and family member on the phone the plan.

## 2016-11-09 NOTE — Telephone Encounter (Signed)
Vincent Ochoa called stating her husband Vincent Ochoa is filling up with fluid and she wants him to have a paracentesis. Dr. Pascal Lux is on vacation. I s/w Vincent Ochoa the PA and she states his primary care has been ordering them and asked they call there. I relayed the msg to Vincent Ochoa she understood, she did ask to make an appt with Dr. Pascal Lux to discuss the recurring need for the paracentesis and if there is anything to prevent having them so often. I scheduled Vincent Ochoa 11/24/15 Dr. Pascal Lux next avail day in clinic./vm

## 2016-11-09 NOTE — Telephone Encounter (Signed)
I agree that if patient is having shortness of breath he should go straight to the ED, as he will likely need another paracentesis.  I am unfortunately not in office again until next week.  I am copying his PCP on this to see if she can answer any questions his wife may have with regards to his care.

## 2016-11-10 ENCOUNTER — Telehealth: Payer: Self-pay | Admitting: Pharmacist

## 2016-11-10 ENCOUNTER — Encounter: Payer: Self-pay | Admitting: Family Medicine

## 2016-11-10 ENCOUNTER — Ambulatory Visit (INDEPENDENT_AMBULATORY_CARE_PROVIDER_SITE_OTHER): Payer: Medicaid Other | Admitting: Family Medicine

## 2016-11-10 ENCOUNTER — Telehealth: Payer: Self-pay | Admitting: Hematology and Oncology

## 2016-11-10 ENCOUNTER — Ambulatory Visit (HOSPITAL_BASED_OUTPATIENT_CLINIC_OR_DEPARTMENT_OTHER): Payer: Medicaid Other | Admitting: Hematology and Oncology

## 2016-11-10 ENCOUNTER — Other Ambulatory Visit (HOSPITAL_BASED_OUTPATIENT_CLINIC_OR_DEPARTMENT_OTHER): Payer: Medicaid Other

## 2016-11-10 VITALS — BP 122/66 | HR 88 | Temp 97.4°F | Wt 296.6 lb

## 2016-11-10 DIAGNOSIS — R188 Other ascites: Secondary | ICD-10-CM

## 2016-11-10 DIAGNOSIS — D693 Immune thrombocytopenic purpura: Secondary | ICD-10-CM

## 2016-11-10 DIAGNOSIS — K7031 Alcoholic cirrhosis of liver with ascites: Secondary | ICD-10-CM | POA: Diagnosis present

## 2016-11-10 DIAGNOSIS — D696 Thrombocytopenia, unspecified: Secondary | ICD-10-CM

## 2016-11-10 DIAGNOSIS — K7460 Unspecified cirrhosis of liver: Secondary | ICD-10-CM | POA: Diagnosis not present

## 2016-11-10 DIAGNOSIS — R3129 Other microscopic hematuria: Secondary | ICD-10-CM | POA: Diagnosis not present

## 2016-11-10 LAB — URINALYSIS, MICROSCOPIC - CHCC
Bilirubin (Urine): NEGATIVE
GLUCOSE UR CHCC: NEGATIVE mg/dL
Ketones: NEGATIVE mg/dL
NITRITE: NEGATIVE
PROTEIN: NEGATIVE mg/dL
Specific Gravity, Urine: 1.015 (ref 1.003–1.035)
Urobilinogen, UR: 0.2 mg/dL (ref 0.2–1)
pH: 7 (ref 4.6–8.0)

## 2016-11-10 LAB — CBC WITH DIFFERENTIAL/PLATELET
BASO%: 0.3 % (ref 0.0–2.0)
Basophils Absolute: 0 10*3/uL (ref 0.0–0.1)
EOS%: 2.2 % (ref 0.0–7.0)
Eosinophils Absolute: 0.1 10*3/uL (ref 0.0–0.5)
HEMATOCRIT: 32.7 % — AB (ref 38.4–49.9)
HGB: 11.1 g/dL — ABNORMAL LOW (ref 13.0–17.1)
LYMPH%: 28.8 % (ref 14.0–49.0)
MCH: 30.9 pg (ref 27.2–33.4)
MCHC: 33.9 g/dL (ref 32.0–36.0)
MCV: 91.1 fL (ref 79.3–98.0)
MONO#: 0.7 10*3/uL (ref 0.1–0.9)
MONO%: 17.8 % — ABNORMAL HIGH (ref 0.0–14.0)
NEUT#: 1.9 10*3/uL (ref 1.5–6.5)
NEUT%: 50.9 % (ref 39.0–75.0)
Platelets: 47 10*3/uL — ABNORMAL LOW (ref 140–400)
RBC: 3.59 10*6/uL — AB (ref 4.20–5.82)
RDW: 15.6 % — AB (ref 11.0–14.6)
WBC: 3.7 10*3/uL — AB (ref 4.0–10.3)
lymph#: 1.1 10*3/uL (ref 0.9–3.3)

## 2016-11-10 MED ORDER — ELTROMBOPAG OLAMINE 50 MG PO TABS: 50.0000 mg | ORAL_TABLET | Freq: Every day | ORAL | 11 refills | Status: DC

## 2016-11-10 NOTE — Telephone Encounter (Signed)
Oral Chemotherapy Pharmacist Encounter  Received notification from Dr. Alvy Bimler that patient is not responding to [facility] 25mg  daily and dose will be increased to 50mg  daily. Received new prescription. Prescription faxed to [employer] [facility] [place] ([facility]) at [phone]. I also confirmed with [facility] patient phone number as [phone]. [facility] will forward new prescription to their pharmacy. The pharmacy will process the prescription and reach out to patient for shipping and delivery information.  Johny Drilling, PharmD, BCPS, BCOP 11/10/2016  2:33 PM Oral Oncology Clinic [phone]

## 2016-11-10 NOTE — Telephone Encounter (Signed)
Appointments scheduled per 11/10/16 los. A copy of the appointment schedule and AVS report was given to the patient, per 11/10/16 los. °

## 2016-11-10 NOTE — Progress Notes (Signed)
Date of Visit: 11/10/2016   HPI:  Patient presents for a same day appointment to discuss shortness of breath  Shortness of breath: Patient presents here today with large volume of ascites this morning, patient was seen in the ED yesterday and for a paracentesis and was sent home and told he did not need it. Patient shortness of breath has been stable overnight, and would like to have a paracentesis done today but refused to go to the Emergency Department. Patient had a large volume paracentesis done on 12/12 when they took out about 8L per patient report. Patient was schedule for his next tap on 11/23/2016. Lasix dosage was recently increase from 40 mg daily to 60 mg.   ROS: See HPI  [place]:  Past Medical History:  Diagnosis Date  . Arthritis    "hands, knees, back" (07/05/2016)  . Back pain, lumbosacral 2014  . CHF (congestive heart failure) ([place])   . Cirrhosis ([place])   . Coronary artery disease    a. Evaluated by Dr. Stanford Breed 2012 - nonobstructive 2007-2008 by cath. b. Abnormal stress test 10/2014 but cath deferred temporarily due to thrombocytopenia. c. 10/2014: readm with AF-RVR/NSTEMI - s/p BMS to mLCx 11/04/14 (mod dz in RCA);  d. relook cath 01/2015 and 06/02/2015 patent stent, nonobs CAD, EF 55-65%.  . Elevated LFTs   . Facial cellulitis 2014   Periorbital  . Fatty liver US - 2012  . Frequent headaches   . GERD (gastroesophageal reflux disease)   . History of blood transfusion 01/2016   "lost alot of blood vomiting"  . Hyperlipidemia   . Morbid obesity ([place])   . Obesity   . OSA on CPAP    severe with AHI 56/hr with successful CPAP titration to 18cm H2O  . PAF (paroxysmal atrial fibrillation) ([place])    a. Episode 10/2014 at time of NSTEMI, spont converted to NSR. Observation for further episodes (not on anticoag at present time due to [facility] = 1, thrombocytopenia).  . Psoriasis   . Sinus bradycardia   . Stroke [facility])    "I've been told I'd had a mini stroke on my left side"  (07/05/2016)  . Thrombocytopenia ([place])    a. Onset unclear, but noted 2012. ? 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 s/p prednisone, IVIG.     PHYSICAL EXAM: BP 122/66   Pulse 88   Temp 97.4 F (36.3 C) (Oral)   Wt 296 lb 9.6 oz (134.5 kg)   SpO2 97%   BMI 37.07 kg/m   General: NAD, pleasant, able to participate in exam Cardiac: RRR, normal heart sounds, no murmurs. 2+ radial and PT pulses bilaterally Respiratory: CTAB, normal effort, No wheezes, rales or rhonchi Abdomen: distended mild pain on palpation, +BS Extremities: no edema or cyanosis. WWP. Skin: warm and dry, no rashes noted Neuro: alert and oriented x4, no focal deficits Psych: Normal affect and mood   ASSESSMENT/PLAN:  #Ascites, chronic  Patient has had paracentesis performed in multiple locations over the years. He has been scheduled for a paracentesis on 11/11/2016 at 1:00 pm with U/S team at [facility] main hospital. Patient have been given return precautions if breathing worsens in the next few hours. Based on exam and presentation, patient will probably need large volume paracentesis with albumin infusion/replacement. Patient will follow with GI physician.  FOLLOW UP: Follow up with PCP and GI   Marjie Skiff, MD [employer]

## 2016-11-10 NOTE — Patient Instructions (Signed)
It was great seeing you today! We have addressed the following issues today  1. We made an appointment for you to get a paracentesis. Your appointment is tomorrow at 2:00 pm at [facility] hospital ultrasound unit. 2. If your shortness of breath worsens before your appointment tomorrow, please go to the emergency department   If we did any lab work today, and the results require attention, either me or my nurse will get in touch with you. If everything is normal, you will get a letter in mail. If you don't hear from [place] in two weeks, please give [place] a call. Otherwise, we look forward to seeing you again at your next visit. If you have any questions or concerns before then, please call the clinic at [phone].   Please bring all your medications to every doctors visit   Sign up for My Chart to have easy access to your labs results, and communication with your Primary care physician.     Please check-out at the front desk before leaving the clinic.    Take Care,

## 2016-11-11 ENCOUNTER — Ambulatory Visit (HOSPITAL_COMMUNITY)
Admission: RE | Admit: 2016-11-11 | Discharge: 2016-11-11 | Disposition: A | Discharge status: Home / Self Care | Payer: Medicaid Other | Source: Ambulatory Visit | Attending: Family Medicine | Admitting: Family Medicine

## 2016-11-11 ENCOUNTER — Encounter: Payer: Self-pay | Admitting: Hematology and Oncology

## 2016-11-11 DIAGNOSIS — K7031 Alcoholic cirrhosis of liver with ascites: Secondary | ICD-10-CM | POA: Insufficient documentation

## 2016-11-11 LAB — URINE CULTURE

## 2016-11-11 MED ORDER — LIDOCAINE HCL (PF) 1 % IJ SOLN
INTRAMUSCULAR | Status: AC
  Filled 2016-11-11: qty 10

## 2016-11-11 MED ORDER — ALBUMIN HUMAN 25 % IV SOLN
55.0000 g | Freq: Once | INTRAVENOUS | Status: AC
  Administered 2016-11-11: 55 g via INTRAVENOUS
  Filled 2016-11-11: qty 300

## 2016-11-11 NOTE — Assessment & Plan Note (Signed)
The patient had liver cirrhosis and recent ascites, with therapeutic paracentesis is recently. He felt that he has reaccumulation of abdominal ascites. The patient is compliant taking Spironolactone and furosemide. I recommend close follow-up with GI specialist for further management of ascites

## 2016-11-11 NOTE — Progress Notes (Signed)
[place] OFFICE PROGRESS NOTE  Ralene Ok, MD SUMMARY OF HEMATOLOGIC HISTORY:  Vincent Ochoa had history of cardiac disease was admitted via Emergency Department for recurrent chest pain.He was initially seen in consultation on 12/8 for thrombocytopenia, felt to be likely autoimmune (related to psoriasis and liver disease), responding well to 2 doses of IVIG in addition to prednisone and transfusions. Patient had been discharged on 12/11 home with baby aspirin. He was subsequently readmitted to the hospital again on 11/03/2014 with recurrent chest pain and subsequently underwent cardiac catheterization and placement of bare metal stent on 11/04/2014. On 12/31/2014, he discontinued prednisone Throughout 2017, he had recurrent admission to the hospital due to chest pain, acute GI bleed, encephalopathy, bleeding gastric varices, alcoholic cirrhosis of the liver and respiratory failure. He received embolization of bleeding vessels and recurrent episodes of paracentesis for abdominal ascites On 08/17/2016, EGD revealed large varices were found in the lower third of the esophagus, treated successfully with bending. Scattered moderate inflammation/gastritis was noted in the stomach On 09/26/2016, colonoscopy revealed internal hemorrhoids On 10/14/2016, he underwent ultrasound-guided paracentesis of which more than 10 L of ascitic fluid was removed On 11/02/2016, he started Promacta 25 mg daily On 11/10/16, the dose Promacta is increased to 50 mg daily INTERVAL HISTORY: Vincent Ochoa 60 y.o. male returns for follow-up. He had rapid reaccumulation of ascitic fluid and felt short of breath. Repeat paracentesis has been scheduled. The patient denies any recent signs or symptoms of bleeding such as spontaneous epistaxis, hematuria or hematochezia. He denies recent chest pain.  I have reviewed the past medical history, past surgical history, social history and family history with the  patient and they are unchanged from previous note.  ALLERGIES:  is allergic to asa [aspirin]; eliquis [apixaban]; nsaids; plavix [clopidogrel]; and statins.  MEDICATIONS:  Current Outpatient Prescriptions  Medication Sig Dispense Refill  . atorvastatin (LIPITOR) 40 MG tablet Take 1 tablet (40 mg total) by mouth daily at 8 pm. (Patient taking differently: Take 40 mg by mouth daily. ) 30 tablet 0  . buPROPion (WELLBUTRIN XL) 150 MG 24 hr tablet Take 1 tablet (150 mg total) by mouth daily. 30 tablet 3  . carvedilol (COREG) 3.125 MG tablet Take 1 tablet (3.125 mg total) by mouth 2 (two) times daily with a meal. 60 tablet 0  . furosemide (LASIX) 40 MG tablet Take 0.5 tablets (20 mg total) by mouth 2 (two) times daily. (Patient taking differently: Take 40 mg by mouth 2 (two) times daily. ) 60 tablet 1  . isosorbide mononitrate (IMDUR) 30 MG 24 hr tablet Take 1 tablet (30 mg total) by mouth daily. 30 tablet 1  . pantoprazole (PROTONIX) 40 MG tablet Take 1 tablet (40 mg total) by mouth 2 (two) times daily. 60 tablet 2  . spironolactone (ALDACTONE) 25 MG tablet Take 2 tablets (50 mg total) by mouth daily. 60 tablet 2  . sucralfate (CARAFATE) 1 g tablet Take 1 tablet (1 g total) by mouth 4 (four) times daily -  with meals and at bedtime. Dissolve in water. (Patient taking differently: Take 1 g by mouth 3 (three) times daily as needed (stomach irritation). Dissolve in water.) 120 tablet 2  . eltrombopag (PROMACTA) 50 MG tablet Take 1 tablet (50 mg total) by mouth daily. Take on an empty stomach 1 hour before a meal or 2 hours after 30 tablet 11  . nitroGLYCERIN (NITROSTAT) 0.4 MG SL tablet Place 1 tablet (0.4 mg total) under the tongue  every 5 (five) minutes as needed for chest pain (up to 3 doses). (Patient not taking: Reported on 11/10/2016) 25 tablet 3  . oxyCODONE-acetaminophen (PERCOCET/ROXICET) 5-325 MG tablet Take 1-2 tablets by mouth every 4 (four) hours as needed for severe pain. (Patient not taking:  Reported on 11/10/2016) 30 tablet 0   No current facility-administered medications for this visit.      REVIEW OF SYSTEMS:   Constitutional: Denies fevers, chills or night sweats Eyes: Denies blurriness of vision Ears, nose, mouth, throat, and face: Denies mucositis or sore throat Cardiovascular: Denies palpitation, chest discomfort or lower extremity swelling Gastrointestinal:  Denies nausea, heartburn or change in bowel habits Skin: Denies abnormal skin rashes Lymphatics: Denies new lymphadenopathy or easy bruising Neurological:Denies numbness, tingling or new weaknesses Behavioral/Psych: Mood is stable, no new changes  All other systems were reviewed with the patient and are negative.  PHYSICAL EXAMINATION: ECOG PERFORMANCE STATUS: 2 - Symptomatic, <50% confined to bed  Vitals:   11/10/16 1254  BP: 122/66  Pulse: 88  Resp: 18  Temp: 97.4 F (36.3 C)   Filed Weights   11/10/16 1254  Weight: 296 lb 14.4 oz (134.7 kg)    GENERAL:alert, no distress and comfortable. He is morbidly obese SKIN: Noted some skin bruising  EYES: normal, Conjunctiva are pink and non-injected, sclera clear ABDOMEN:abdomen soft, grossly distended with ascitic fluid Musculoskeletal:no cyanosis of digits and no clubbing  NEURO: alert & oriented x 3 with fluent speech, no focal motor/sensory deficits  LABORATORY DATA:  I have reviewed the data as listed     Component Value Date/Time   NA 139 11/09/2016 1757   NA 139 10/18/2016 1338   K 3.7 11/09/2016 1757   K 4.0 10/18/2016 1338   CL 106 11/09/2016 1757   CO2 27 11/09/2016 1757   CO2 28 10/18/2016 1338   GLUCOSE 98 11/09/2016 1757   GLUCOSE 108 10/18/2016 1338   BUN 14 11/09/2016 1757   BUN 14.4 10/18/2016 1338   CREATININE 1.23 11/09/2016 1757   CREATININE 1.2 10/18/2016 1338   CALCIUM 8.6 (L) 11/09/2016 1757   CALCIUM 8.6 10/18/2016 1338   PROT 6.9 11/09/2016 1757   PROT 7.0 10/18/2016 1338   ALBUMIN 2.6 (L) 11/09/2016 1757    ALBUMIN 2.5 (L) 10/18/2016 1338   AST 81 (H) 11/09/2016 1757   AST 84 (H) 10/18/2016 1338   ALT 52 11/09/2016 1757   ALT 56 (H) 10/18/2016 1338   ALKPHOS 104 11/09/2016 1757   ALKPHOS 111 10/18/2016 1338   BILITOT 1.8 (H) 11/09/2016 1757   BILITOT 1.92 (H) 10/18/2016 1338   GFRNONAA >60 11/09/2016 1757   GFRNONAA 62 03/03/2016 1102   GFRAA >60 11/09/2016 1757   GFRAA 71 03/03/2016 1102    No results found for: SPEP, UPEP  Lab Results  Component Value Date   WBC 3.7 (L) 11/10/2016   NEUTROABS 1.9 11/10/2016   HGB 11.1 (L) 11/10/2016   HCT 32.7 (L) 11/10/2016   MCV 91.1 11/10/2016   PLT 47 (L) 11/10/2016      Chemistry      Component Value Date/Time   NA 139 11/09/2016 1757   NA 139 10/18/2016 1338   K 3.7 11/09/2016 1757   K 4.0 10/18/2016 1338   CL 106 11/09/2016 1757   CO2 27 11/09/2016 1757   CO2 28 10/18/2016 1338   BUN 14 11/09/2016 1757   BUN 14.4 10/18/2016 1338   CREATININE 1.23 11/09/2016 1757   CREATININE 1.2 10/18/2016 1338  Component Value Date/Time   CALCIUM 8.6 (L) 11/09/2016 1757   CALCIUM 8.6 10/18/2016 1338   ALKPHOS 104 11/09/2016 1757   ALKPHOS 111 10/18/2016 1338   AST 81 (H) 11/09/2016 1757   AST 84 (H) 10/18/2016 1338   ALT 52 11/09/2016 1757   ALT 56 (H) 10/18/2016 1338   BILITOT 1.8 (H) 11/09/2016 1757   BILITOT 1.92 (H) 10/18/2016 1338       ASSESSMENT & PLAN:  Chronic ITP (idiopathic thrombocytopenia) (HCC) He has 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 The patient has been tried on IVIG and prednisone without beneficial bumping platelet count. We discussed the risk and benefit of Promacta and he agreed to proceed. So far, the patient have no benefit from low-dose Promacta. I plan to increase to dose of Promacta to 50 mg and recheck his blood work next week.   Cirrhosis of liver with ascites  ([place]) The patient had liver cirrhosis and recent ascites, with therapeutic paracentesis is recently. He felt that he has reaccumulation of abdominal ascites. The patient is compliant taking Spironolactone and furosemide. I recommend close follow-up with GI specialist for further management of ascites   No orders of the defined types were placed in this encounter.   All questions were answered. The patient knows to call the clinic with any problems, questions or concerns. No barriers to learning was detected.  I spent 15 minutes counseling the patient face to face. The total time spent in the appointment was 20 minutes and more than 50% was on counseling.     Heath Lark, MD 12/22/201712:43 PM

## 2016-11-11 NOTE — Procedures (Signed)
Ultrasound-guided  therapeutic paracentesis performed yielding 8.3 liters of yellow  fluid. No immediate complications. Pt will receive IV albumin postprocedure.

## 2016-11-11 NOTE — Assessment & Plan Note (Signed)
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 The patient has been tried on IVIG and prednisone without beneficial bumping platelet count. We discussed the risk and benefit of Promacta and he agreed to proceed. So far, the patient have no benefit from low-dose Promacta. I plan to increase to dose of Promacta to 50 mg and recheck his blood work next week.

## 2016-11-16 ENCOUNTER — Other Ambulatory Visit (HOSPITAL_COMMUNITY): Payer: Self-pay | Admitting: Interventional Radiology

## 2016-11-16 DIAGNOSIS — R188 Other ascites: Secondary | ICD-10-CM

## 2016-11-17 ENCOUNTER — Telehealth: Payer: Self-pay | Admitting: *Deleted

## 2016-11-17 NOTE — Telephone Encounter (Signed)
Received fax from [facility] stating patient informed them that Rx for Lasix 40 mg was increased to 1 tablet BID.  If this is correct please send in new Rx or give [facility] a call at [phone].  Derl Barrow, RN

## 2016-11-18 ENCOUNTER — Telehealth: Payer: Self-pay | Admitting: *Deleted

## 2016-11-18 NOTE — Telephone Encounter (Signed)
Pt called in stating he had blood in his urine. We don't have any openings. Precepted with dr Ree Kida and pt was told to go to the ED. Sahithi Ordoyne Kennon Holter, CMA

## 2016-11-19 ENCOUNTER — Emergency Department (HOSPITAL_COMMUNITY): Payer: Medicaid Other

## 2016-11-19 ENCOUNTER — Encounter (HOSPITAL_COMMUNITY): Payer: Self-pay

## 2016-11-19 ENCOUNTER — Inpatient Hospital Stay (HOSPITAL_COMMUNITY)
Admission: EM | Admit: 2016-11-19 | Discharge: 2016-11-25 | DRG: 433 | Disposition: A | Discharge status: Home / Self Care | Payer: Medicaid Other | Attending: Family Medicine | Admitting: Family Medicine

## 2016-11-19 ENCOUNTER — Other Ambulatory Visit: Payer: Self-pay

## 2016-11-19 DIAGNOSIS — R109 Unspecified abdominal pain: Secondary | ICD-10-CM | POA: Diagnosis present

## 2016-11-19 DIAGNOSIS — I5042 Chronic combined systolic (congestive) and diastolic (congestive) heart failure: Secondary | ICD-10-CM | POA: Diagnosis present

## 2016-11-19 DIAGNOSIS — Z955 Presence of coronary angioplasty implant and graft: Secondary | ICD-10-CM

## 2016-11-19 DIAGNOSIS — K7031 Alcoholic cirrhosis of liver with ascites: Principal | ICD-10-CM | POA: Diagnosis present

## 2016-11-19 DIAGNOSIS — K652 Spontaneous bacterial peritonitis: Secondary | ICD-10-CM | POA: Diagnosis not present

## 2016-11-19 DIAGNOSIS — Z66 Do not resuscitate: Secondary | ICD-10-CM | POA: Diagnosis present

## 2016-11-19 DIAGNOSIS — I252 Old myocardial infarction: Secondary | ICD-10-CM | POA: Diagnosis not present

## 2016-11-19 DIAGNOSIS — Z8673 Personal history of transient ischemic attack (TIA), and cerebral infarction without residual deficits: Secondary | ICD-10-CM

## 2016-11-19 DIAGNOSIS — E785 Hyperlipidemia, unspecified: Secondary | ICD-10-CM | POA: Diagnosis present

## 2016-11-19 DIAGNOSIS — Z87442 Personal history of urinary calculi: Secondary | ICD-10-CM

## 2016-11-19 DIAGNOSIS — I2510 Atherosclerotic heart disease of native coronary artery without angina pectoris: Secondary | ICD-10-CM | POA: Diagnosis present

## 2016-11-19 DIAGNOSIS — R319 Hematuria, unspecified: Secondary | ICD-10-CM

## 2016-11-19 DIAGNOSIS — K7460 Unspecified cirrhosis of liver: Secondary | ICD-10-CM

## 2016-11-19 DIAGNOSIS — D693 Immune thrombocytopenic purpura: Secondary | ICD-10-CM | POA: Diagnosis present

## 2016-11-19 DIAGNOSIS — F329 Major depressive disorder, single episode, unspecified: Secondary | ICD-10-CM | POA: Diagnosis present

## 2016-11-19 DIAGNOSIS — R188 Other ascites: Secondary | ICD-10-CM

## 2016-11-19 DIAGNOSIS — R310 Gross hematuria: Secondary | ICD-10-CM | POA: Diagnosis present

## 2016-11-19 DIAGNOSIS — Z6837 Body mass index (BMI) 37.0-37.9, adult: Secondary | ICD-10-CM | POA: Diagnosis not present

## 2016-11-19 DIAGNOSIS — Z87891 Personal history of nicotine dependence: Secondary | ICD-10-CM

## 2016-11-19 DIAGNOSIS — D61818 Other pancytopenia: Secondary | ICD-10-CM | POA: Diagnosis not present

## 2016-11-19 DIAGNOSIS — I8510 Secondary esophageal varices without bleeding: Secondary | ICD-10-CM | POA: Diagnosis present

## 2016-11-19 DIAGNOSIS — G4733 Obstructive sleep apnea (adult) (pediatric): Secondary | ICD-10-CM | POA: Diagnosis present

## 2016-11-19 DIAGNOSIS — K219 Gastro-esophageal reflux disease without esophagitis: Secondary | ICD-10-CM | POA: Diagnosis present

## 2016-11-19 DIAGNOSIS — Z96652 Presence of left artificial knee joint: Secondary | ICD-10-CM | POA: Diagnosis present

## 2016-11-19 DIAGNOSIS — K3189 Other diseases of stomach and duodenum: Secondary | ICD-10-CM | POA: Diagnosis present

## 2016-11-19 DIAGNOSIS — K317 Polyp of stomach and duodenum: Secondary | ICD-10-CM | POA: Diagnosis present

## 2016-11-19 DIAGNOSIS — K766 Portal hypertension: Secondary | ICD-10-CM | POA: Diagnosis present

## 2016-11-19 DIAGNOSIS — Z79899 Other long term (current) drug therapy: Secondary | ICD-10-CM

## 2016-11-19 DIAGNOSIS — R042 Hemoptysis: Secondary | ICD-10-CM | POA: Diagnosis present

## 2016-11-19 LAB — BASIC METABOLIC PANEL
ANION GAP: 8 (ref 5–15)
BUN: 11 mg/dL (ref 6–20)
CO2: 25 mmol/L (ref 22–32)
Calcium: 8.6 mg/dL — ABNORMAL LOW (ref 8.9–10.3)
Chloride: 103 mmol/L (ref 101–111)
Creatinine, Ser: 1.12 mg/dL (ref 0.61–1.24)
GFR calc Af Amer: >60 mL/min (ref >60)
Glucose, Bld: 105 mg/dL — ABNORMAL HIGH (ref 65–99)
POTASSIUM: 4 mmol/L (ref 3.5–5.1)
SODIUM: 136 mmol/L (ref 135–145)

## 2016-11-19 LAB — HEPATIC FUNCTION PANEL
ALBUMIN: 2.5 g/dL — AB (ref 3.5–5.0)
ALT: 46 U/L (ref 17–63)
AST: 70 U/L — AB (ref 15–41)
Alkaline Phosphatase: 90 U/L (ref 38–126)
BILIRUBIN TOTAL: 2.9 mg/dL — AB (ref 0.3–1.2)
Bilirubin, Direct: 0.8 mg/dL — ABNORMAL HIGH (ref 0.1–0.5)
Indirect Bilirubin: 2.1 mg/dL — ABNORMAL HIGH (ref 0.3–0.9)
TOTAL PROTEIN: 6.6 g/dL (ref 6.5–8.1)

## 2016-11-19 LAB — CBC
HEMATOCRIT: 32.7 % — AB (ref 39.0–52.0)
HEMOGLOBIN: 11 g/dL — AB (ref 13.0–17.0)
MCH: 30.2 pg (ref 26.0–34.0)
MCHC: 33.6 g/dL (ref 30.0–36.0)
MCV: 89.8 fL (ref 78.0–100.0)
Platelets: 63 10*3/uL — ABNORMAL LOW (ref 150–400)
RBC: 3.64 MIL/uL — AB (ref 4.22–5.81)
RDW: 15.6 % — ABNORMAL HIGH (ref 11.5–15.5)
WBC: 5.4 10*3/uL (ref 4.0–10.5)

## 2016-11-19 LAB — URINALYSIS, ROUTINE W REFLEX MICROSCOPIC
Glucose, UA: NEGATIVE mg/dL
Ketones, ur: NEGATIVE mg/dL
Nitrite: NEGATIVE
PH: 6 (ref 5.0–8.0)
Protein, ur: 30 mg/dL — AB
SPECIFIC GRAVITY, URINE: 1.025 (ref 1.005–1.030)
Squamous Epithelial / LPF: NONE SEEN

## 2016-11-19 LAB — LIPASE, BLOOD: Lipase: 24 U/L (ref 11–51)

## 2016-11-19 LAB — I-STAT CG4 LACTIC ACID, ED: LACTIC ACID, VENOUS: 1.4 mmol/L (ref 0.5–1.9)

## 2016-11-19 LAB — PROTIME-INR
INR: 1.48
Prothrombin Time: 18 seconds — ABNORMAL HIGH (ref 11.4–15.2)

## 2016-11-19 LAB — I-STAT TROPONIN, ED: Troponin i, poc: 0 ng/mL (ref 0.00–0.08)

## 2016-11-19 LAB — APTT: aPTT: 34 seconds (ref 24–36)

## 2016-11-19 MED ORDER — SPIRONOLACTONE 25 MG PO TABS
50.0000 mg | ORAL_TABLET | Freq: Every day | ORAL | Status: DC
  Administered 2016-11-20: 50 mg via ORAL
  Filled 2016-11-19: qty 2

## 2016-11-19 MED ORDER — SUCRALFATE 1 G PO TABS
1.0000 g | ORAL_TABLET | Freq: Three times a day (TID) | ORAL | Status: DC | PRN
  Administered 2016-11-20: 1 g via ORAL
  Filled 2016-11-19: qty 1

## 2016-11-19 MED ORDER — BUPROPION HCL ER (XL) 150 MG PO TB24
150.0000 mg | ORAL_TABLET | Freq: Every day | ORAL | Status: DC
Start: 2016-11-20 — End: 2016-11-25
  Administered 2016-11-20 – 2016-11-25 (×6): 150 mg via ORAL
  Filled 2016-11-19 (×6): qty 1

## 2016-11-19 MED ORDER — IOPAMIDOL (ISOVUE-300) INJECTION 61%
INTRAVENOUS | Status: AC
  Administered 2016-11-19: 100 mL
  Filled 2016-11-19: qty 100

## 2016-11-19 MED ORDER — ENOXAPARIN SODIUM 80 MG/0.8ML ~~LOC~~ SOLN: 70.0000 mg | SUBCUTANEOUS | Status: DC

## 2016-11-19 MED ORDER — ACETAMINOPHEN 325 MG PO TABS: 650.0000 mg | ORAL_TABLET | Freq: Four times a day (QID) | ORAL | Status: DC | PRN

## 2016-11-19 MED ORDER — ISOSORBIDE MONONITRATE ER 30 MG PO TB24
30.0000 mg | ORAL_TABLET | Freq: Every day | ORAL | Status: DC
  Administered 2016-11-20 – 2016-11-25 (×6): 30 mg via ORAL
  Filled 2016-11-19 (×6): qty 1

## 2016-11-19 MED ORDER — PANTOPRAZOLE SODIUM 40 MG PO TBEC
40.0000 mg | DELAYED_RELEASE_TABLET | Freq: Two times a day (BID) | ORAL | Status: DC
  Administered 2016-11-20 – 2016-11-25 (×12): 40 mg via ORAL
  Filled 2016-11-19 (×12): qty 1

## 2016-11-19 MED ORDER — ONDANSETRON HCL 4 MG PO TABS
4.0000 mg | ORAL_TABLET | Freq: Four times a day (QID) | ORAL | Status: DC | PRN
  Administered 2016-11-20: 4 mg via ORAL
  Filled 2016-11-19: qty 1

## 2016-11-19 MED ORDER — NITROGLYCERIN 0.4 MG SL SUBL: 0.4000 mg | SUBLINGUAL_TABLET | SUBLINGUAL | Status: DC | PRN

## 2016-11-19 MED ORDER — FUROSEMIDE 40 MG PO TABS
40.0000 mg | ORAL_TABLET | Freq: Two times a day (BID) | ORAL | Status: DC
  Administered 2016-11-20 – 2016-11-25 (×10): 40 mg via ORAL
  Filled 2016-11-19 (×11): qty 1

## 2016-11-19 MED ORDER — HYDROMORPHONE HCL 2 MG/ML IJ SOLN
1.0000 mg | Freq: Once | INTRAMUSCULAR | Status: AC
  Administered 2016-11-19: 1 mg via INTRAVENOUS
  Filled 2016-11-19: qty 1

## 2016-11-19 MED ORDER — ONDANSETRON HCL 4 MG/2ML IJ SOLN
4.0000 mg | Freq: Once | INTRAMUSCULAR | Status: AC
  Administered 2016-11-19: 4 mg via INTRAVENOUS
  Filled 2016-11-19: qty 2

## 2016-11-19 MED ORDER — ATORVASTATIN CALCIUM 40 MG PO TABS
40.0000 mg | ORAL_TABLET | Freq: Every day | ORAL | Status: DC
  Administered 2016-11-20 – 2016-11-25 (×6): 40 mg via ORAL
  Filled 2016-11-19 (×6): qty 1

## 2016-11-19 MED ORDER — CARVEDILOL 3.125 MG PO TABS
3.1250 mg | ORAL_TABLET | Freq: Two times a day (BID) | ORAL | Status: DC
  Administered 2016-11-20 – 2016-11-25 (×9): 3.125 mg via ORAL
  Filled 2016-11-19 (×11): qty 1

## 2016-11-19 MED ORDER — ONDANSETRON HCL 4 MG/2ML IJ SOLN
4.0000 mg | Freq: Four times a day (QID) | INTRAMUSCULAR | Status: DC | PRN
  Administered 2016-11-24: 4 mg via INTRAVENOUS
  Filled 2016-11-19: qty 2

## 2016-11-19 MED ORDER — ELTROMBOPAG OLAMINE 25 MG PO TABS
25.0000 mg | ORAL_TABLET | Freq: Every day | ORAL | Status: DC
  Administered 2016-11-23: 25 mg via ORAL
  Filled 2016-11-19 (×3): qty 1

## 2016-11-19 MED ORDER — TRAMADOL HCL 50 MG PO TABS
50.0000 mg | ORAL_TABLET | Freq: Four times a day (QID) | ORAL | Status: DC | PRN
  Administered 2016-11-20 – 2016-11-25 (×5): 50 mg via ORAL
  Filled 2016-11-19 (×5): qty 1

## 2016-11-19 MED ORDER — ACETAMINOPHEN 650 MG RE SUPP: 650.0000 mg | Freq: Four times a day (QID) | RECTAL | Status: DC | PRN

## 2016-11-19 MED ORDER — DEXTROSE 5 % IV SOLN
1.0000 g | Freq: Once | INTRAVENOUS | Status: AC
  Administered 2016-11-19: 1 g via INTRAVENOUS
  Filled 2016-11-19: qty 10

## 2016-11-19 NOTE — ED Notes (Signed)
Nurse will get blood for Istat lactic acid test.

## 2016-11-19 NOTE — ED Provider Notes (Signed)
[facility] DEPT Provider Note   CSN: [id number] Arrival date & time: 11/19/16  1700     History   Chief Complaint Chief Complaint  Patient presents with  . Shortness of Breath  . Hematuria  . Abdominal Pain    HPI Vincent Ochoa is a 60 y.o. male.  The history is provided by the patient and the spouse.  Abdominal Pain   This is a recurrent problem. The current episode started 2 days ago. The problem occurs constantly. The problem has been gradually worsening. Associated with: ascites. The pain is located in the generalized abdominal region. The quality of the pain is aching. The pain is at a severity of 8/10. The pain is severe. Associated symptoms include fever (low grade fevers at home starting today) and hematuria. Pertinent negatives include diarrhea, hematochezia, melena, nausea, vomiting, dysuria and headaches. The symptoms are aggravated by certain positions. Nothing relieves the symptoms. Past medical history comments: cirrhosis.    Past Medical History:  Diagnosis Date  . Arthritis    "hands, knees, back" (07/05/2016)  . Back pain, lumbosacral 2014  . CHF (congestive heart failure) ([place])   . Cirrhosis ([place])   . Coronary artery disease    a. Evaluated by Dr. Stanford Breed 2012 - nonobstructive 2007-2008 by cath. b. Abnormal stress test 10/2014 but cath deferred temporarily due to thrombocytopenia. c. 10/2014: readm with AF-RVR/NSTEMI - s/p BMS to mLCx 11/04/14 (mod dz in RCA);  d. relook cath 01/2015 and 06/02/2015 patent stent, nonobs CAD, EF 55-65%.  . Elevated LFTs   . Facial cellulitis 2014   Periorbital  . Fatty liver US - 2012  . Frequent headaches   . GERD (gastroesophageal reflux disease)   . History of blood transfusion 01/2016   "lost alot of blood vomiting"  . Hyperlipidemia   . Morbid obesity ([place])   . Obesity   . OSA on CPAP    severe with AHI 56/hr with successful CPAP titration to 18cm H2O  . PAF (paroxysmal atrial fibrillation) ([place])    a. Episode  10/2014 at time of NSTEMI, spont converted to NSR. Observation for further episodes (not on anticoag at present time due to [facility] = 1, thrombocytopenia).  . Psoriasis   . Sinus bradycardia   . Stroke [facility])    "I've been told I'd had a mini stroke on my left side" (07/05/2016)  . Thrombocytopenia ([place])    a. Onset unclear, but noted 2012. ? 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 s/p prednisone, IVIG.    Patient Active Problem List   Diagnosis Date Noted  . Hematuria, microscopic 10/18/2016  . Rectal bleeding 09/26/2016  . Nephrolithiasis 09/15/2016  . Chronic ITP (idiopathic thrombocytopenia) ([facility]) 09/15/2016  . Varices, esophageal ([place]) 09/15/2016  . Essential hypertension   . Hematemesis 06/21/2016  . Chronic combined systolic and diastolic congestive heart failure ([place])   . Erectile dysfunction 06/03/2016  . Cirrhosis of liver with ascites ([place]) 02/25/2016  . Portal vein thrombosis   . Alcoholic cirrhosis of liver with ascites ([place])   . Diastolic heart failure ([place]) 10/07/2015  . Leukopenia 09/15/2015  . Acrochordon 07/18/2015  . PAF (paroxysmal atrial fibrillation) ([place])   . Obese   . Knee pain, bilateral 03/05/2015  . Depression 01/08/2015  . OSA (obstructive sleep apnea)   . NSTEMI (non-ST elevated myocardial infarction) ([place])   . Back pain, lumbosacral 01/04/2013  . Thrombocytopenia ([place]) 11/29/2012  . GERD (gastroesophageal reflux disease) 08/15/2011  . Coronary artery  disease involving native coronary artery of native heart without angina pectoris 07/29/2011  . Psoriasis 07/29/2011    Past Surgical History:  Procedure Laterality Date  . CARDIAC CATHETERIZATION    . CARDIAC CATHETERIZATION N/A 06/02/2015   Procedure: Left Heart Cath and Coronary Angiography;  Surgeon: Jettie Booze, MD;  Location: [place] CV LAB;  Service: Cardiovascular;  Laterality: N/A;  . COLONOSCOPY WITH PROPOFOL N/A 09/26/2016   Procedure:  COLONOSCOPY WITH PROPOFOL;  Surgeon: Wilford Corner, MD;  Location: [facility] ENDOSCOPY;  Service: Endoscopy;  Laterality: N/A;  . ESOPHAGOGASTRODUODENOSCOPY N/A 06/22/2016   Procedure: ESOPHAGOGASTRODUODENOSCOPY (EGD);  Surgeon: Clarene Essex, MD;  Location: [facility] ENDOSCOPY;  Service: Endoscopy;  Laterality: N/A;  . ESOPHAGOGASTRODUODENOSCOPY (EGD) WITH PROPOFOL N/A 08/17/2016   Procedure: ESOPHAGOGASTRODUODENOSCOPY (EGD) WITH PROPOFOL;  Surgeon: Otis Brace, MD;  Location: [place];  Service: Gastroenterology;  Laterality: N/A;  . FACIAL COSMETIC SURGERY  1990s   "rodeo-related injury"  . IR GENERIC HISTORICAL  09/14/2016   IR RADIOLOGIST EVAL & MGMT 09/14/2016 Sandi Mariscal, MD [facility] INTERV RAD  . KNEE ARTHROPLASTY Left 1970's   "put cartilage in it"  . LAPAROSCOPIC CHOLECYSTECTOMY  2007  . LEFT HEART CATHETERIZATION WITH CORONARY ANGIOGRAM N/A 11/04/2014   Procedure: LEFT HEART CATHETERIZATION WITH CORONARY ANGIOGRAM;  Surgeon: Jettie Booze, MD;  Location: [facility] CATH LAB;  Service: Cardiovascular;  Laterality: N/A;  . LEFT HEART CATHETERIZATION WITH CORONARY ANGIOGRAM N/A 01/28/2015   Procedure: LEFT HEART CATHETERIZATION WITH CORONARY ANGIOGRAM;  Surgeon: Sherren Mocha, MD;  Location: [facility] CATH LAB;  Service: Cardiovascular;  Laterality: N/A;  . liver cirrhosis    . RADIOLOGY WITH ANESTHESIA N/A 02/04/2016   Procedure: RADIOLOGY WITH ANESTHESIA;  Surgeon: Medication Radiologist, MD;  Location: [place];  Service: Radiology;  Laterality: N/A;  . TONSILLECTOMY  1960's       Home Medications    Prior to Admission medications   Medication Sig Start Date End Date Taking? Authorizing Provider  atorvastatin (LIPITOR) 40 MG tablet Take 1 tablet (40 mg total) by mouth daily at 8 pm. Patient taking differently: Take 40 mg by mouth daily.  06/23/16   Everrett Coombe, MD  buPROPion (WELLBUTRIN XL) 150 MG 24 hr tablet Take 1 tablet (150 mg total) by mouth daily. 11/01/16   Carlyle Dolly, MD  carvedilol  (COREG) 3.125 MG tablet Take 1 tablet (3.125 mg total) by mouth 2 (two) times daily with a meal. 11/01/16   Carlyle Dolly, MD  eltrombopag (PROMACTA) 50 MG tablet Take 1 tablet (50 mg total) by mouth daily. Take on an empty stomach 1 hour before a meal or 2 hours after 11/10/16   Heath Lark, MD  furosemide (LASIX) 40 MG tablet Take 0.5 tablets (20 mg total) by mouth 2 (two) times daily. Patient taking differently: Take 40 mg by mouth 2 (two) times daily.  11/01/16   Carlyle Dolly, MD  isosorbide mononitrate (IMDUR) 30 MG 24 hr tablet Take 1 tablet (30 mg total) by mouth daily. 11/01/16   Carlyle Dolly, MD  nitroGLYCERIN (NITROSTAT) 0.4 MG SL tablet Place 1 tablet (0.4 mg total) under the tongue every 5 (five) minutes as needed for chest pain (up to 3 doses). Patient not taking: Reported on 11/10/2016 10/31/14   Dayna N Dunn, PA-C  oxyCODONE-acetaminophen (PERCOCET/ROXICET) 5-325 MG tablet Take 1-2 tablets by mouth every 4 (four) hours as needed for severe pain. Patient not taking: Reported on 11/10/2016 09/15/16   Virginia Crews, MD  pantoprazole ([place]) 40  MG tablet Take 1 tablet (40 mg total) by mouth 2 (two) times daily. 11/01/16   Carlyle Dolly, MD  spironolactone (ALDACTONE) 25 MG tablet Take 2 tablets (50 mg total) by mouth daily. 11/01/16   Carlyle Dolly, MD  sucralfate (CARAFATE) 1 g tablet Take 1 tablet (1 g total) by mouth 4 (four) times daily -  with meals and at bedtime. Dissolve in water. Patient taking differently: Take 1 g by mouth 3 (three) times daily as needed (stomach irritation). Dissolve in water. 08/19/16   Sela Hilding, MD    Family History Family History  Problem Relation Age of Onset  . Cirrhosis Mother   . Heart attack Mother   . Hypertension Mother   . Hypertension Father   . Stroke Father   . Arthritis Maternal Grandmother   . Heart disease Sister   . Diabetes Sister   . Cancer - Ovarian Sister   . Heart disease       Maternal/paternal grandparents  . Heart attack Maternal Aunt     Social History Social History  Substance Use Topics  . Smoking status: Former Smoker    Packs/day: 1.50    Years: 43.00    Types: Cigarettes    Quit date: 02/19/2016  . Smokeless tobacco: Former Systems developer  . Alcohol use 0.0 oz/week     Comment: 07/05/2016 "mostly stopped in 1979; might have a beer q 6 months or so"     Allergies   Asa [aspirin]; Eliquis [apixaban]; Nsaids; Plavix [clopidogrel]; and Statins   Review of Systems Review of Systems  Constitutional: Positive for chills and fever (low grade fevers at home starting today).  Eyes: Negative for visual disturbance.  Respiratory: Negative for cough and shortness of breath.   Cardiovascular: Positive for leg swelling. Negative for chest pain and palpitations.  Gastrointestinal: Positive for abdominal distention and abdominal pain. Negative for blood in stool, diarrhea, hematochezia, melena, nausea and vomiting.  Genitourinary: Positive for hematuria. Negative for dysuria.  Skin: Negative for rash.  Neurological: Negative for light-headedness and headaches.  Psychiatric/Behavioral: Negative for confusion.  All other systems reviewed and are negative.    Physical Exam Updated Vital Signs BP 114/61   Pulse 83   Temp 99.4 F (37.4 C) (Oral)   Ht 6\' 3"  (1.905 m)   Wt 135.5 kg   SpO2 99%   BMI 37.33 kg/m   Physical Exam  Constitutional: He appears well-developed and well-nourished. No distress.  HENT:  Head: Normocephalic and atraumatic.  Mouth/Throat: Oropharynx is clear and moist.  Eyes: Conjunctivae are normal. No scleral icterus.  Neck: Neck supple.  Cardiovascular: Normal rate and regular rhythm.   No murmur heard. Pulmonary/Chest: Effort normal. No respiratory distress. He has decreased breath sounds (bibasilar). He has no wheezes. He has no rhonchi. He has no rales.  Abdominal: Soft. He exhibits distension, fluid wave and ascites. There is  generalized tenderness. There is guarding. There is no rigidity, no rebound and no CVA tenderness. No hernia.  Musculoskeletal: He exhibits no edema.  Neurological: He is alert.  Skin: Skin is warm and dry.  Psychiatric: He has a normal mood and affect.  Nursing note and vitals reviewed.    ED Treatments / Results  Labs (all labs ordered are listed, but only abnormal results are displayed) Labs Reviewed  BASIC METABOLIC PANEL - Abnormal; Notable for the following:       Result Value   Glucose, Bld 105 (*)    Calcium 8.6 (*)  All other components within normal limits  CBC - Abnormal; Notable for the following:    RBC 3.64 (*)    Hemoglobin 11.0 (*)    HCT 32.7 (*)    RDW 15.6 (*)    Platelets 63 (*)    All other components within normal limits  URINALYSIS, ROUTINE W REFLEX MICROSCOPIC - Abnormal; Notable for the following:    Color, Urine AMBER (*)    Hgb urine dipstick SMALL (*)    Bilirubin Urine SMALL (*)    Protein, ur 30 (*)    Leukocytes, UA TRACE (*)    Bacteria, UA RARE (*)    All other components within normal limits  PROTIME-INR - Abnormal; Notable for the following:    Prothrombin Time 18.0 (*)    All other components within normal limits  HEPATIC FUNCTION PANEL - Abnormal; Notable for the following:    Albumin 2.5 (*)    AST 70 (*)    Total Bilirubin 2.9 (*)    Bilirubin, Direct 0.8 (*)    Indirect Bilirubin 2.1 (*)    All other components within normal limits  CULTURE, BLOOD (ROUTINE X 2)  CULTURE, BLOOD (ROUTINE X 2)  APTT  LIPASE, BLOOD  CBC  CREATININE, SERUM  BASIC METABOLIC PANEL  CBC  PROTIME-INR  APTT  I-STAT TROPOININ, ED  I-STAT CG4 LACTIC ACID, ED    EKG  EKG Interpretation None       Radiology Dg Chest 2 View  Result Date: 11/19/2016 CLINICAL DATA:  Patient with right lower quadrant pain for 2 days. Shortness of breath. EXAM: CHEST  2 VIEW COMPARISON:  Chest radiograph 10/31/2016. FINDINGS: Stable cardiac and mediastinal  contours. No consolidative pulmonary opacities. No pleural effusion or pneumothorax. Regional skeleton is unremarkable. IMPRESSION: No active cardiopulmonary disease. Electronically Signed   By: Lovey Newcomer M.D.   On: 11/19/2016 18:22   Ct Abdomen Pelvis W Contrast  Result Date: 11/19/2016 CLINICAL DATA:  Worsening of abdominal swelling. Cirrhosis and ascites. Sudden onset of right lower quadrant pain. EXAM: CT ABDOMEN AND PELVIS WITH CONTRAST TECHNIQUE: Multidetector CT imaging of the abdomen and pelvis was performed using the standard protocol following bolus administration of intravenous contrast. CONTRAST:  152mL ISOVUE-300 IOPAMIDOL (ISOVUE-300) INJECTION 61% COMPARISON:  09/13/2016 FINDINGS: Lower chest: Normal Hepatobiliary: Advanced cirrhosis of the liver. Small liver with nodular surface and relative prominence of the left lobe an uncinate process. No focal lesions seen. Previous cholecystectomy. Pancreas: Normal Spleen: Splenomegaly consistent with portal venous hypertension. Adrenals/Urinary Tract: No adrenal mass. Renal parenchyma is normal. 7 mm stone lower pole right kidney. 4 mm stone midportion right kidney. Stomach/Bowel: No acute bowel finding. Vascular/Lymphatic: Aortic atherosclerosis. Infrarenal abdominal aortic aneurysm maximal transverse diameter 3.1 cm. IVC is normal. Extensive upper abdominal varices. Reproductive: Negative Other: Very large amount of ascites, freely distributed. Markedly increased since 2 months ago. Musculoskeletal: No acute finding. Ordinary spinal degenerative changes. IMPRESSION: Massive increase in ascites, freely distributed. No other change. Cirrhosis, splenomegaly and upper abdominal varices. Nonobstructing renal calculi on the right. 3.1 cm infrarenal abdominal aortic aneurysm. Electronically Signed   By: Nelson Chimes M.D.   On: 11/19/2016 21:41    Procedures Procedures (including critical care time)  Medications Ordered in ED Medications - No data to  display   Initial Impression / Assessment and Plan / ED Course  I have reviewed the triage vital signs and the nursing notes.  Pertinent labs & imaging results that were available during my care of the patient  were reviewed by me and considered in my medical decision making (see chart for details).  Clinical Course    Patient is a 60 year old male with history of cirrhosis, ascites, CAD, hypertension, A. fib, diastolic heart failure, ITP who presents with 2 days of worsening abdominal pain that is diffuse in nature. Patient has been having increasing ascites acute relation. Patient states he has had several liters drawn off weekly over the last few weeks. Patient also started having low-grade fevers earlier today along with the worsening abdominal pain.  Patient here has temperature of 99.9 rectally. No leukocytosis. Urine has gross hematuria, negative nitrite, trace leukocytes. Patient has had hematuria today and has had history of kidney stones however does not have any renal colic type pain. Patient is no flank pain on exam. Patient is diffusely tender and mildly peritonitic on exam. Concern for SBP. Patient also felt a pop sensation in the right lower quadrant this morning. CT abdomen/pelvis obtained with contrast which does not show any signs of perforated viscus but does show marked increase in ascites.   Patient given 1 g of Rocephin to cover for SBP. Patient will be admitted to the family medicine service for further management of SBP and to arrange for diagnostic/therapeutic paracentesis. Patient given 1 dose of Dilaudid while in the ED for pain.  Pt seen with attending, Dr. Winfred Leeds.  Final Clinical Impressions(s) / ED Diagnoses   Final diagnoses:  SBP (spontaneous bacterial peritonitis) ([place])  Other ascites  Hematuria, unspecified type    New Prescriptions New Prescriptions   No medications on file     Tobie Poet, DO 11/20/16 [place], MD 11/20/16  [phone]

## 2016-11-19 NOTE — ED Notes (Signed)
Admitting MD at bedside.

## 2016-11-19 NOTE — ED Notes (Signed)
Pt to CT at this time.

## 2016-11-19 NOTE — H&P (Signed)
[facility] Admission History and Physical Service Pager: [phone]  Patient name: Vincent Ochoa Medical record number: [id number] Date of birth: September 27, 1956 Age: 60 y.o. Gender: male  Primary Care Provider: Ralene Ok, MD Consultants: IR Code Status: DNR (discussed on admission)  Chief Complaint: hematuria, abdominal pain  Assessment and Plan: Vincent Ochoa is a 60 y.o. male presenting with hematuria and abdominal pain . PMH is significant for SBP, alcoholic cirrhosis of liver with ascites, CAD with NSTEMI, psoriasis, thrombocytopenia, OSA, obesity, CHF, GERD, depression, and PAF.   #Abdominal pain 2/2 cirrhosis of liver with ascites.  Patient with history of cirrhosis and SBP.  Presenting with gradual increase in abdominal swelling due to ascites.  VSS.  Urine with gross hematuria.  Afebrile in ED (Temp 99.9 rectally) with no elevated white count, but reported subjective fevers at home.  iStat LA normal.  UA with gross hematuria, negative nitrite, trace leuks.  Labs unremarkable except albumin low at 2.5, AST elevated to 70 with normal ALT.  Has been getting weekly paracentesis for ascites reaccumulation. Last paracentesis one week ago with 8.3 L fluid removed.  CT showing marked increase in ascites.  Given mild concern for SBP given severe tenderness on abdominal palpation, 1g Rocephin x1 given in ED.  -Admit to inpatient, attending Dr. McDiarmid -Interventional radiology consult in AM for diagnostic and therapeutic paracentesis - Consider GI consult in AM to discuss long-term treatment options -Will continue Rocephin 1g Q24h, consider changing to Cefotaxime IV 2 g Q8h as patient with no apparent GI hemorrhage -AM CBC, BMET -APTT, Protime-INR -Tramadol 50 mg po Q6h for pain control -Zofran 4 mg po Q6 prn -Vitals per unit routine   #Hematuria.  UA +for gross hematuria, neg nitrite and trace leuks . Hx of kidney stones in past.  CT A/P with no signs of  perforated viscus.   No CVA tenderness or renal colic type pain.  With no signs of infection on UA, less concern for UTI.  Perhaps secondary to coagulopathy. Hgb stable at 11.0 at this time.  -Continue to monitor  #PAF.  On problem list.  Not on pharmacologic treatment currently. A.fib not present on ED EKG.  -Continue to monitor  #GERD.  Stable.  At home on Protonix 40 mg po BID and sucralfate 1g PO TID.  -Continue home regimen  #CHF.  Stable.  Echo 10/2014 with EF 60-65%.  At home on Lasix and Spironolactone.  -Continue Lasix 40 mg daily -continue Spironolactone 50 mg oral daily  #CAD.  S/p Left heart cath July 2016 showing nonobstructive coronary artery disease.  Mid cx lesion previously treated with a bare metal stent in 10/2014.  Follows with Dr. Burt Knack outpatient.  -Continue Coreg 3.125 mg po BID -continue Lipitor 40 mg po daily -continue Imdur 30 mg po daily  #Thrombocytopenia.  Stable.  On admission, plt count of 63.  At prior admission on 12/21, plt count of 47.  At home on Promacta 100 mg po daily.   -Continue home regimen  -Monitor  #OSA.  Stable.  Uses CPAP at home.  -CPAP ordered  #Depression.  Stable.  At home on Wellbutrin.   -Wellbutrin 150 mg po daily   FEN/GI: Heart healthy Prophylaxis: Protonix, Lovenox, SLIV  Disposition: Admit to inpatient, attending Dr. McDiarmid  History of Present Illness:  Vincent Ochoa is a 60 y.o. male presenting abdominal pain and tightness over the last month.   Patient presents due to abdominal pain and "building up  fluid again." Has had serial paracentesis over the past month for ascites 2/2 alcoholic cirrhosis, with last paracentesis about a week ago (8.3L fluid removed at that time). Reports SOB that he thinks is due to increasing ascites, which he says is greatly improved by supplemental O2 via [place] in ED.Denies chest pain.  Also reporting intermittent hematuria for the past month, and dysuria today. Wife says patient's urine is  malodorous. Denies increased urinary frequency. Subjective fevers at home but has not measured temperature. Endorses chills and rhinorrhea.   Review Of Systems: Per HPI with the following additions:   Review of Systems  HENT: Negative for ear discharge.   Eyes: Negative for blurred vision.  Respiratory: Negative for cough, shortness of breath and wheezing.   Cardiovascular: Positive for leg swelling. Negative for chest pain.  Gastrointestinal: Positive for abdominal pain and nausea. Negative for blood in stool, diarrhea, heartburn and vomiting.  Genitourinary: Positive for hematuria. Negative for dysuria, flank pain, frequency and urgency.  Musculoskeletal: Negative for joint pain and neck pain.  Neurological: Positive for tremors. Negative for dizziness, tingling, sensory change, speech change, focal weakness, seizures and headaches.  Psychiatric/Behavioral: Negative for depression.    Patient Active Problem List   Diagnosis Date Noted  . Hematuria, microscopic 10/18/2016  . Rectal bleeding 09/26/2016  . Nephrolithiasis 09/15/2016  . Chronic ITP (idiopathic thrombocytopenia) (HCC) 09/15/2016  . Varices, esophageal ([place]) 09/15/2016  . Essential hypertension   . Hematemesis 06/21/2016  . Chronic combined systolic and diastolic congestive heart failure ([place])   . Erectile dysfunction 06/03/2016  . Cirrhosis of liver with ascites ([place]) 02/25/2016  . Portal vein thrombosis   . Alcoholic cirrhosis of liver with ascites ([place])   . Diastolic heart failure ([place]) 10/07/2015  . Leukopenia 09/15/2015  . Acrochordon 07/18/2015  . PAF (paroxysmal atrial fibrillation) ([place])   . Obese   . Knee pain, bilateral 03/05/2015  . Depression 01/08/2015  . OSA (obstructive sleep apnea)   . NSTEMI (non-ST elevated myocardial infarction) ([place])   . Back pain, lumbosacral 01/04/2013  . Thrombocytopenia ([place]) 11/29/2012  . GERD (gastroesophageal reflux disease) 08/15/2011  . Coronary artery disease  involving native coronary artery of native heart without angina pectoris 07/29/2011  . Psoriasis 07/29/2011    Past Medical History: Past Medical History:  Diagnosis Date  . Arthritis    "hands, knees, back" (07/05/2016)  . Back pain, lumbosacral 2014  . CHF (congestive heart failure) ([place])   . Cirrhosis ([place])   . Coronary artery disease    a. Evaluated by Dr. Stanford Breed 2012 - nonobstructive 2007-2008 by cath. b. Abnormal stress test 10/2014 but cath deferred temporarily due to thrombocytopenia. c. 10/2014: readm with AF-RVR/NSTEMI - s/p BMS to mLCx 11/04/14 (mod dz in RCA);  d. relook cath 01/2015 and 06/02/2015 patent stent, nonobs CAD, EF 55-65%.  . Elevated LFTs   . Facial cellulitis 2014   Periorbital  . Fatty liver US - 2012  . Frequent headaches   . GERD (gastroesophageal reflux disease)   . History of blood transfusion 01/2016   "lost alot of blood vomiting"  . Hyperlipidemia   . Morbid obesity ([place])   . Obesity   . OSA on CPAP    severe with AHI 56/hr with successful CPAP titration to 18cm H2O  . PAF (paroxysmal atrial fibrillation) ([place])    a. Episode 10/2014 at time of NSTEMI, spont converted to NSR. Observation for further episodes (not on anticoag at present time due to [facility] = 1, thrombocytopenia).  Marland Kitchen  Psoriasis   . Sinus bradycardia   . Stroke [facility])    "I've been told I'd had a mini stroke on my left side" (07/05/2016)  . Thrombocytopenia ([place])    a. Onset unclear, but noted 2012. ? 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 s/p prednisone, IVIG.   Past Surgical History: Past Surgical History:  Procedure Laterality Date  . CARDIAC CATHETERIZATION    . CARDIAC CATHETERIZATION N/A 06/02/2015   Procedure: Left Heart Cath and Coronary Angiography;  Surgeon: Jettie Booze, MD;  Location: [facility] CV LAB;  Service: Cardiovascular;  Laterality: N/A;  . COLONOSCOPY WITH PROPOFOL N/A 09/26/2016   Procedure: COLONOSCOPY WITH PROPOFOL;   Surgeon: Wilford Corner, MD;  Location: [facility] ENDOSCOPY;  Service: Endoscopy;  Laterality: N/A;  . ESOPHAGOGASTRODUODENOSCOPY N/A 06/22/2016   Procedure: ESOPHAGOGASTRODUODENOSCOPY (EGD);  Surgeon: Clarene Essex, MD;  Location[facility] ENDOSCOPY;  Service: Endoscopy;  Laterality: N/A;  . ESOPHAGOGASTRODUODENOSCOPY (EGD) WITH PROPOFOL N/A 08/17/2016   Procedure: ESOPHAGOGASTRODUODENOSCOPY (EGD) WITH PROPOFOL;  Surgeon: Otis Brace, MD;  Location: [facility];  Service: Gastroenterology;  Laterality: N/A;  . FACIAL COSMETIC SURGERY  1990s   "rodeo-related injury"  . IR GENERIC HISTORICAL  09/14/2016   IR RADIOLOGIST EVAL & MGMT 09/14/2016 Sandi Mariscal, MD [facility] INTERV RAD  . KNEE ARTHROPLASTY Left 1970's   "put cartilage in it"  . LAPAROSCOPIC CHOLECYSTECTOMY  2007  . LEFT HEART CATHETERIZATION WITH CORONARY ANGIOGRAM N/A 11/04/2014   Procedure: LEFT HEART CATHETERIZATION WITH CORONARY ANGIOGRAM;  Surgeon: Jettie Booze, MD;  Location: [facility] CATH LAB;  Service: Cardiovascular;  Laterality: N/A;  . LEFT HEART CATHETERIZATION WITH CORONARY ANGIOGRAM N/A 01/28/2015   Procedure: LEFT HEART CATHETERIZATION WITH CORONARY ANGIOGRAM;  Surgeon: Sherren Mocha, MD;  Location: [facility] CATH LAB;  Service: Cardiovascular;  Laterality: N/A;  . liver cirrhosis    . RADIOLOGY WITH ANESTHESIA N/A 02/04/2016   Procedure: RADIOLOGY WITH ANESTHESIA;  Surgeon: Medication Radiologist, MD;  Location: [facility];  Service: Radiology;  Laterality: N/A;  . TONSILLECTOMY  1960's   Social History: Social History  Substance Use Topics  . Smoking status: Former Smoker    Packs/day: 1.50    Years: 43.00    Types: Cigarettes    Quit date: 02/19/2016  . Smokeless tobacco: Former Systems developer  . Alcohol use 0.0 oz/week     Comment: 07/05/2016 "mostly stopped in 1979; might have a beer q 6 months or so"   Additional social history: Lives at home with wife.   Please also refer to relevant sections of EMR.  Family History: Family History   Problem Relation Age of Onset  . Cirrhosis Mother   . Heart attack Mother   . Hypertension Mother   . Hypertension Father   . Stroke Father   . Arthritis Maternal Grandmother   . Heart disease Sister   . Diabetes Sister   . Cancer - Ovarian Sister   . Heart disease      Maternal/paternal grandparents  . Heart attack Maternal Aunt    Allergies and Medications: Allergies  Allergen Reactions  . Asa [Aspirin] Other (See Comments)    Previous hemmorhage  . Eliquis [Apixaban] Other (See Comments)    Potentiality of internal bleeding  . Nsaids Other (See Comments)    May cause bleeding issues (per wife) May only have 1 plain Tylenol every 8 hours is needed for flu symptoms, per doctor.  . Plavix [Clopidogrel] Other (See Comments)    Potentiality of internal bleeding  .  Statins Other (See Comments)    Potentially may make patient bleed internally (per wife)   No current facility-administered medications on file prior to encounter.    Current Outpatient Prescriptions on File Prior to Encounter  Medication Sig Dispense Refill  . atorvastatin (LIPITOR) 40 MG tablet Take 1 tablet (40 mg total) by mouth daily at 8 pm. (Patient taking differently: Take 40 mg by mouth daily. ) 30 tablet 0  . buPROPion (WELLBUTRIN XL) 150 MG 24 hr tablet Take 1 tablet (150 mg total) by mouth daily. 30 tablet 3  . carvedilol (COREG) 3.125 MG tablet Take 1 tablet (3.125 mg total) by mouth 2 (two) times daily with a meal. 60 tablet 0  . eltrombopag (PROMACTA) 50 MG tablet Take 1 tablet (50 mg total) by mouth daily. Take on an empty stomach 1 hour before a meal or 2 hours after (Patient taking differently: Take 100 mg by mouth daily. Take on an empty stomach 1 hour before a meal or 2 hours after) 30 tablet 11  . furosemide (LASIX) 40 MG tablet Take 0.5 tablets (20 mg total) by mouth 2 (two) times daily. (Patient taking differently: Take 40 mg by mouth 2 (two) times daily. ) 60 tablet 1  . isosorbide mononitrate  (IMDUR) 30 MG 24 hr tablet Take 1 tablet (30 mg total) by mouth daily. 30 tablet 1  . nitroGLYCERIN (NITROSTAT) 0.4 MG SL tablet Place 1 tablet (0.4 mg total) under the tongue every 5 (five) minutes as needed for chest pain (up to 3 doses). 25 tablet 3  . pantoprazole (PROTONIX) 40 MG tablet Take 1 tablet (40 mg total) by mouth 2 (two) times daily. 60 tablet 2  . spironolactone (ALDACTONE) 25 MG tablet Take 2 tablets (50 mg total) by mouth daily. 60 tablet 2  . sucralfate (CARAFATE) 1 g tablet Take 1 tablet (1 g total) by mouth 4 (four) times daily -  with meals and at bedtime. Dissolve in water. (Patient taking differently: Take 1 g by mouth 3 (three) times daily as needed (stomach irritation). Dissolve in water.) 120 tablet 2   Objective: BP 114/63   Pulse 90   Temp 99.9 F (37.7 C)   Resp 14   Ht 6\' 3"  (1.905 m)   Wt 298 lb 11.2 oz (135.5 kg)   SpO2 94%   BMI 37.33 kg/m  Exam: General: 60 yo M laying in hospital bed in NAD Eyes: PERRL, EOMI, no scleral icterus ENTM: MMM, oropharynx clear Neck: supple, no JVD Cardiovascular: RRR, no MRG Respiratory: CTA B/L, no rales or rhonchi present, normal effort of breathing on 2L via [place] Gastrointestinal: markedly distended, abdomen diffusely tender and tight 2/2 ascites,  +bs in 4 quadrants, no CVA tenderness noted MSK: no edema or tenderness noted Derm: skin taut over abdomen secondary to fluid accumulation Neuro: AAOx3, no asterixis noted on exam, 5/5 strength in bilateral UE and LE, finger to nose intact Psych: mood appropriate  Labs and Imaging: CBC BMET   Recent Labs Lab 11/19/16 1728  WBC 5.4  HGB 11.0*  HCT 32.7*  PLT 63*    Recent Labs Lab 11/19/16 1728  NA 136  K 4.0  CL 103  CO2 25  BUN 11  CREATININE 1.12  GLUCOSE 105*  CALCIUM 8.6*     Dg Chest 2 View  Result Date: 11/19/2016 CLINICAL DATA:  Patient with right lower quadrant pain for 2 days. Shortness of breath. EXAM: CHEST  2 VIEW COMPARISON:  Chest  radiograph  10/31/2016. FINDINGS: Stable cardiac and mediastinal contours. No consolidative pulmonary opacities. No pleural effusion or pneumothorax. Regional skeleton is unremarkable. IMPRESSION: No active cardiopulmonary disease. Electronically Signed   By: Lovey Newcomer M.D.   On: 11/19/2016 18:22   Ct Abdomen Pelvis W Contrast  Result Date: 11/19/2016 CLINICAL DATA:  Worsening of abdominal swelling. Cirrhosis and ascites. Sudden onset of right lower quadrant pain. EXAM: CT ABDOMEN AND PELVIS WITH CONTRAST TECHNIQUE: Multidetector CT imaging of the abdomen and pelvis was performed using the standard protocol following bolus administration of intravenous contrast. CONTRAST:  133mL ISOVUE-300 IOPAMIDOL (ISOVUE-300) INJECTION 61% COMPARISON:  09/13/2016 FINDINGS: Lower chest: Normal Hepatobiliary: Advanced cirrhosis of the liver. Small liver with nodular surface and relative prominence of the left lobe an uncinate process. No focal lesions seen. Previous cholecystectomy. Pancreas: Normal Spleen: Splenomegaly consistent with portal venous hypertension. Adrenals/Urinary Tract: No adrenal mass. Renal parenchyma is normal. 7 mm stone lower pole right kidney. 4 mm stone midportion right kidney. Stomach/Bowel: No acute bowel finding. Vascular/Lymphatic: Aortic atherosclerosis. Infrarenal abdominal aortic aneurysm maximal transverse diameter 3.1 cm. IVC is normal. Extensive upper abdominal varices. Reproductive: Negative Other: Very large amount of ascites, freely distributed. Markedly increased since 2 months ago. Musculoskeletal: No acute finding. Ordinary spinal degenerative changes. IMPRESSION: Massive increase in ascites, freely distributed. No other change. Cirrhosis, splenomegaly and upper abdominal varices. Nonobstructing renal calculi on the right. 3.1 cm infrarenal abdominal aortic aneurysm. Electronically Signed   By: Nelson Chimes M.D.   On: 11/19/2016 21:41    Lovenia Kim, MD 11/19/2016, 10:45 PM PGY-1,  [place] [facility] pager: [phone], text pages welcome  UPPER LEVEL ADDENDUM  I have read the above note and made revisions highlighted in orange.  Adin Hector, MD, MPH PGY-2 [facility] Pager [phone]

## 2016-11-19 NOTE — ED Notes (Signed)
Pt with hx of ascites, recently had paracentesis done a week ago with 8.3L of fluid removed. Pt c/o increased pain and swelling in abdominal with shortness of breath. Reports being on 1L of oxygen at home. Requesting to be placed on oxygen at present. Pt placed on 2L [place]

## 2016-11-19 NOTE — ED Notes (Signed)
Pt  C/o nausea after pain med given,  Zofran 4mg  IV given for same

## 2016-11-19 NOTE — ED Provider Notes (Signed)
Patient with subjective fever for approximately the past 3 days he also reports progressively worsening abdominal pain and swelling for the past 3 weeks. He reports that he felt a sudden onset "pop" at right suprapubic area this morning causing exquisite pain. Also reports intermittent hematuria for several months. Hematuria recurring 3 days ago On exam patient is chronically ill-appearing. Alert. Answers questions appropriately Abdomen is markedly distended and tender to percussion. Genitalia normal male.   Orlie Dakin, MD 11/20/16 [phone]

## 2016-11-19 NOTE — ED Triage Notes (Signed)
Onset yesterday increase in abd swelling d/t ascites, RLQ abd pain, shortness of breath, hematuria, and spit up small amount of blood PTA.

## 2016-11-19 NOTE — ED Notes (Signed)
Attempted report 

## 2016-11-19 NOTE — ED Notes (Signed)
On the way to x-ray, pt stated that he felt like he had "needles in his stomach"; pt brought back to waiting room; RN notified

## 2016-11-20 ENCOUNTER — Encounter (HOSPITAL_COMMUNITY): Payer: Self-pay | Admitting: *Deleted

## 2016-11-20 LAB — BASIC METABOLIC PANEL
ANION GAP: 3 — AB (ref 5–15)
BUN: 9 mg/dL (ref 6–20)
CALCIUM: 8.2 mg/dL — AB (ref 8.9–10.3)
CO2: 27 mmol/L (ref 22–32)
Chloride: 103 mmol/L (ref 101–111)
Creatinine, Ser: 0.96 mg/dL (ref 0.61–1.24)
GFR calc non Af Amer: >60 mL/min (ref >60)
Glucose, Bld: 90 mg/dL (ref 65–99)
Potassium: 3.7 mmol/L (ref 3.5–5.1)
SODIUM: 133 mmol/L — AB (ref 135–145)

## 2016-11-20 LAB — CBC
HCT: 29.4 % — ABNORMAL LOW (ref 39.0–52.0)
HEMATOCRIT: 29.1 % — AB (ref 39.0–52.0)
Hemoglobin: 9.9 g/dL — ABNORMAL LOW (ref 13.0–17.0)
Hemoglobin: 9.7 g/dL — ABNORMAL LOW (ref 13.0–17.0)
MCH: 30.4 pg (ref 26.0–34.0)
MCH: 30.1 pg (ref 26.0–34.0)
MCHC: 33.7 g/dL (ref 30.0–36.0)
MCHC: 33.3 g/dL (ref 30.0–36.0)
MCV: 90.2 fL (ref 78.0–100.0)
MCV: 90.4 fL (ref 78.0–100.0)
Platelets: 50 10*3/uL — ABNORMAL LOW (ref 150–400)
Platelets: 52 10*3/uL — ABNORMAL LOW (ref 150–400)
RBC: 3.26 MIL/uL — AB (ref 4.22–5.81)
RBC: 3.22 MIL/uL — ABNORMAL LOW (ref 4.22–5.81)
RDW: 15.6 % — ABNORMAL HIGH (ref 11.5–15.5)
RDW: 15.4 % (ref 11.5–15.5)
WBC: 3.4 10*3/uL — AB (ref 4.0–10.5)
WBC: 3.4 10*3/uL — ABNORMAL LOW (ref 4.0–10.5)

## 2016-11-20 LAB — TYPE AND SCREEN
ABO/RH(D): A NEG
Antibody Screen: NEGATIVE

## 2016-11-20 LAB — LACTATE DEHYDROGENASE: LDH: 223 U/L — AB (ref 98–192)

## 2016-11-20 LAB — ALBUMIN: Albumin: 2.4 g/dL — ABNORMAL LOW (ref 3.5–5.0)

## 2016-11-20 LAB — APTT: APTT: 36 s (ref 24–36)

## 2016-11-20 LAB — PROTIME-INR
INR: 1.5
PROTHROMBIN TIME: 18.3 s — AB (ref 11.4–15.2)

## 2016-11-20 LAB — MRSA PCR SCREENING: MRSA BY PCR: NEGATIVE

## 2016-11-20 MED ORDER — DEXTROSE 5 % IV SOLN: 2.0000 g | INTRAVENOUS | Status: DC

## 2016-11-20 MED ORDER — METRONIDAZOLE 500 MG PO TABS
500.0000 mg | ORAL_TABLET | Freq: Four times a day (QID) | ORAL | Status: DC
  Administered 2016-11-20 – 2016-11-23 (×11): 500 mg via ORAL
  Filled 2016-11-20 (×11): qty 1

## 2016-11-20 MED ORDER — DEXTROSE 5 % IV SOLN
1.0000 g | Freq: Once | INTRAVENOUS | Status: AC
  Administered 2016-11-20: 1 g via INTRAVENOUS
  Filled 2016-11-20: qty 10

## 2016-11-20 MED ORDER — DEXTROSE 5 % IV SOLN
2.0000 g | INTRAVENOUS | Status: DC
  Administered 2016-11-20 – 2016-11-22 (×3): 2 g via INTRAVENOUS
  Filled 2016-11-20 (×4): qty 2

## 2016-11-20 MED ORDER — SPIRONOLACTONE 25 MG PO TABS
100.0000 mg | ORAL_TABLET | Freq: Every day | ORAL | Status: DC
  Administered 2016-11-21 – 2016-11-25 (×5): 100 mg via ORAL
  Filled 2016-11-20 (×5): qty 4

## 2016-11-20 MED ORDER — SUMATRIPTAN SUCCINATE 25 MG PO TABS
25.0000 mg | ORAL_TABLET | Freq: Once | ORAL | Status: DC
  Filled 2016-11-20: qty 1

## 2016-11-20 NOTE — Progress Notes (Signed)
Patient vomited small amount, RUQ pain 10/10. Notified MD. MD coming to see patient. Joslyn Hy, MSN, RN, [employer]

## 2016-11-20 NOTE — Progress Notes (Addendum)
FPTS Interim Progress Note  S:Paged by RN for patient complaining of 10/10 RUQ pain and small amount of non-bloody, non-bilious vomit  O: BP (!) 107/59 (BP Location: Left Arm)   Pulse 66   Temp 98.4 F (36.9 C) (Oral)   Resp 18   Ht 6\' 3"  (1.905 m)   Wt 298 lb 11.2 oz (135.5 kg)   SpO2 97%   BMI 37.33 kg/m   General: Obese gentleman sitting up in bed in NAD Cardiac: RRR Resp: NWOB Abd: markedly distended and firm abdomen, with severe TTP in RUQ and mildly tender in LUQ  A/P: 60yo M presenting with ascites 2/2 alcoholic cirrhosis now with worsening ascites and acute onset RUQ pain concerning for SBP. We cannot rule out secondary causes of SBP at this time. Discussed worsening ascites with Interventional Radiologist and they are unable to perform paracentesis tonight. Seen by GI around 630PM and also felt concerned for SBP.  Vital signs stable.  - cont 2g ceftriaxone q24hrs - added flagyl 500mg  q6hrs for additional coverage for secondary causes - encourage carafate and zofran for nausea symptoms.  - continue Lasix 40 mg BID - increase spironolactone to 100 mg  Eloise Levels, MD 11/20/2016, 7:04 PM PGY-1, [place] Service pager [phone]

## 2016-11-20 NOTE — Progress Notes (Signed)
Pt refused cpap at this time.  Rt will monitor.

## 2016-11-20 NOTE — Progress Notes (Signed)
Vincent Ochoa [id number] Admitted to [id number]: 11/20/2016 12:30 AM Attending Provider: Blane Ohara McDiarmid, MD    Vincent Ochoa is a 60 y.o. male patient admitted from ED awake, alert  & orientated  X 3,  DNR, VSS - Blood pressure 123/62, pulse 89, temperature 98.7 F (37.1 C), temperature source Oral, resp. rate 18, height 6\' 3"  (1.905 m), weight 135.5 kg (298 lb 11.2 oz), SpO2 97 %., O2   2 L nasal cannular, no c/o shortness of breath, no c/o chest pain, no distress noted.    IV site WDL:  with a transparent dsg that's clean dry and intact.  Allergies:   Allergies  Allergen Reactions  . Asa [Aspirin] Other (See Comments)    Previous hemmorhage  . Eliquis [Apixaban] Other (See Comments)    Potentiality of internal bleeding  . Nsaids Other (See Comments)    May cause bleeding issues (per wife) May only have 1 plain Tylenol every 8 hours is needed for flu symptoms, per doctor.  . Plavix [Clopidogrel] Other (See Comments)    Potentiality of internal bleeding  . Statins Other (See Comments)    Potentially may make patient bleed internally (per wife)     Past Medical History:  Diagnosis Date  . Arthritis    "hands, knees, back" (07/05/2016)  . Back pain, lumbosacral 2014  . CHF (congestive heart failure) ([place])   . Cirrhosis ([place])   . Coronary artery disease    a. Evaluated by Dr. Stanford Breed 2012 - nonobstructive 2007-2008 by cath. b. Abnormal stress test 10/2014 but cath deferred temporarily due to thrombocytopenia. c. 10/2014: readm with AF-RVR/NSTEMI - s/p BMS to mLCx 11/04/14 (mod dz in RCA);  d. relook cath 01/2015 and 06/02/2015 patent stent, nonobs CAD, EF 55-65%.  . Elevated LFTs   . Facial cellulitis 2014   Periorbital  . Fatty liver US - 2012  . Frequent headaches   . GERD (gastroesophageal reflux disease)   . History of blood transfusion 01/2016   "lost alot of blood vomiting"  . Hyperlipidemia   . Morbid obesity ([place])   . Obesity   . OSA on CPAP    severe with AHI 56/hr  with successful CPAP titration to 18cm H2O  . PAF (paroxysmal atrial fibrillation) ([place])    a. Episode 10/2014 at time of NSTEMI, spont converted to NSR. Observation for further episodes (not on anticoag at present time due to [facility] = 1, thrombocytopenia).  . Psoriasis   . Sinus bradycardia   . Stroke [facility])    "I've been told I'd had a mini stroke on my left side" (07/05/2016)  . Thrombocytopenia ([place])    a. Onset unclear, but noted 2012. ? 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 s/p prednisone, IVIG.    History:  obtained from patient  Pt orientation to unit, room and routine. Information packet given to patient.  Admission INP armband ID verified with patient, and in place. SR up x 2, fall risk assessment complete with Patient verbalizing understanding of risks associated with falls. Pt verbalizes an understanding of how to use the call bell and to call for help before getting out of bed.  Skin, clean-dry- intact without evidence of bruising, or skin tears.   No evidence of skin break down noted on exam.   Will cont to monitor and assist as needed.  Parthenia Ames, RN 11/20/2016 12:30 AM

## 2016-11-20 NOTE — Progress Notes (Signed)
Family Medicine Teaching Service Daily Progress Note Intern Pager: [phone]  Patient name: Vincent Ochoa Medical record number: [id number] Date of birth: February 03, 1956 Age: 60 y.o. Gender: male  Primary Care Provider: Ralene Ok, MD Consultants: IR, GI Code Status: DNR   Pt Overview and Major Events to Date:  12/30 - admitted to FPTS  Assessment and Plan: Vincent Ochoa is a 60 y.o. male presenting with hematuria and abdominal pain . PMH is significant for SBP, alcoholic cirrhosis of liver with ascites, CAD with NSTEMI, psoriasis, thrombocytopenia, OSA, obesity, CHF, GERD, depression, and PAF.   #Abdominal pain 2/2 cirrhosis of liver with ascites.  Patient with history of cirrhosis and SBP.  Presenting with gradual increase in abdominal swelling due to ascites.  VSS.  Urine with gross hematuria.  Afebrile in ED (Temp 99.9 rectally) with no elevated white count, but reported subjective fevers at home.  iStat LA normal.  UA with gross hematuria, negative nitrite, trace leuks.  Labs unremarkable except albumin low at 2.5, AST elevated to 70 with normal ALT.  Has been getting weekly paracentesis for ascites reaccumulation. Last paracentesis one week ago with 8.3 L fluid removed.  CT showing marked increase in ascites.  Given mild concern for SBP given severe tenderness on abdominal palpation, Rocephin started.  -Interventional radiology consult in AM for diagnostic and therapeutic paracentesis - Consider GI consult to discuss long-term treatment options - patient would like to be referred to a new GI, at least on discharge -Will continue Rocephin 1g Q24h, consider changing to Cefotaxime IV 2 g Q8h as patient with no apparent GI hemorrhage -AM CBC, BMET -APTT, Protime-INR -Tramadol 50 mg po Q6h for pain control -Zofran 4 mg po Q6 prn -Vitals per unit routine   #Hematuria.  UA +for gross hematuria, neg nitrite and trace leuks . Hx of kidney stones in past.  CT A/P with no signs of perforated  viscus but with non-obstruction kidney stone noted.  With no signs of infection on UA, less concern for UTI.  Coagulopathy could also possibly be contributing. Hgb stable at 11.0 at this time.  -Continue to monitor -Will likely need urology outpatient f/u on discharge  #PAF.  On problem list.  Not on pharmacologic treatment currently. A.fib not present on ED EKG.  -Continue to monitor  #GERD.  Stable.  At home on Protonix 40 mg po BID and sucralfate 1g PO TID.  -Continue home regimen  #CHF.  Stable.  Echo 10/2014 with EF 60-65%.  At home on Lasix and Spironolactone.  -Continue Lasix 40 mg daily -continue Spironolactone 50 mg oral daily  #CAD.  S/p Left heart cath July 2016 showing nonobstructive coronary artery disease.  Mid cx lesion previously treated with a bare metal stent in 10/2014.  Follows with Dr. Burt Knack outpatient.  -Continue Coreg 3.125 mg po BID -continue Lipitor 40 mg po daily -continue Imdur 30 mg po daily  #Thrombocytopenia.  Stable.  On admission, plt count of 63.  At prior admission on 12/21, plt count of 47.  At home on Promacta 100 mg po daily.   -Continue home regimen  -Monitor  #OSA.  Stable.  Uses CPAP at home.  -CPAP ordered  #Depression.  Stable.  At home on Wellbutrin.   -Wellbutrin 150 mg po daily  FEN/GI: Heart healthy Prophylaxis: Protonix, Lovenox, SLIV  Subjective:  Patient reports minimal improvement in pain this AM. Denies any additional nausea after one incident in ED. No vomiting. Is hungry and thinks he can tolerate  PO. Of note, patient requesting to be referred to a new GI. He and his wife do not have a great relationship with his current GI (Dr. Michail Sermon), and he would like to transfer his GI care elsewhere.    Objective: Temp:  [98.1 F (36.7 C)-99.9 F (37.7 C)] 98.1 F (36.7 C) (12/31 0529) Pulse Rate:  [78-90] 78 (12/31 0529) Resp:  [14-18] 18 (12/31 0529) BP: (109-123)/(61-71) 110/63 (12/31 0529) SpO2:  [94 %-99 %] 99 %  (12/31 0529) Weight:  [298 lb 11.2 oz (135.5 kg)] 298 lb 11.2 oz (135.5 kg) (12/30 1714) Physical Exam: General: sleeping comfortably upon arrival into room, easily able to arouse; very pleasant gentleman in NAD Cardiovascular: RRR, no murmurs appreciated Respiratory: CTAB, normal WOB on 2L [place], no whezes Abdomen: extremely distended and firm, very TTP with LUQ most tender, +BS Extremities: moving all spontaneously, no LE edema Neuro: A&Ox3, no focal deficits Psych: appropriate mood and affect   Laboratory:  Recent Labs Lab 11/19/16 1728  WBC 5.4  HGB 11.0*  HCT 32.7*  PLT 63*    Recent Labs Lab 11/19/16 1728 11/19/16 2008  NA 136  --   K 4.0  --   CL 103  --   CO2 25  --   BUN 11  --   CREATININE 1.12  --   CALCIUM 8.6*  --   PROT  --  6.6  BILITOT  --  2.9*  ALKPHOS  --  90  ALT  --  46  AST  --  70*  GLUCOSE 105*  --     Imaging/Diagnostic Tests: Dg Chest 2 View  Result Date: 11/19/2016 CLINICAL DATA:  Patient with right lower quadrant pain for 2 days. Shortness of breath. EXAM: CHEST  2 VIEW COMPARISON:  Chest radiograph 10/31/2016. FINDINGS: Stable cardiac and mediastinal contours. No consolidative pulmonary opacities. No pleural effusion or pneumothorax. Regional skeleton is unremarkable. IMPRESSION: No active cardiopulmonary disease. Electronically Signed   By: Lovey Newcomer M.D.   On: 11/19/2016 18:22   Ct Abdomen Pelvis W Contrast  Result Date: 11/19/2016 CLINICAL DATA:  Worsening of abdominal swelling. Cirrhosis and ascites. Sudden onset of right lower quadrant pain. EXAM: CT ABDOMEN AND PELVIS WITH CONTRAST TECHNIQUE: Multidetector CT imaging of the abdomen and pelvis was performed using the standard protocol following bolus administration of intravenous contrast. CONTRAST:  19mL ISOVUE-300 IOPAMIDOL (ISOVUE-300) INJECTION 61% COMPARISON:  09/13/2016 FINDINGS: Lower chest: Normal Hepatobiliary: Advanced cirrhosis of the liver. Small liver with nodular  surface and relative prominence of the left lobe an uncinate process. No focal lesions seen. Previous cholecystectomy. Pancreas: Normal Spleen: Splenomegaly consistent with portal venous hypertension. Adrenals/Urinary Tract: No adrenal mass. Renal parenchyma is normal. 7 mm stone lower pole right kidney. 4 mm stone midportion right kidney. Stomach/Bowel: No acute bowel finding. Vascular/Lymphatic: Aortic atherosclerosis. Infrarenal abdominal aortic aneurysm maximal transverse diameter 3.1 cm. IVC is normal. Extensive upper abdominal varices. Reproductive: Negative Other: Very large amount of ascites, freely distributed. Markedly increased since 2 months ago. Musculoskeletal: No acute finding. Ordinary spinal degenerative changes. IMPRESSION: Massive increase in ascites, freely distributed. No other change. Cirrhosis, splenomegaly and upper abdominal varices. Nonobstructing renal calculi on the right. 3.1 cm infrarenal abdominal aortic aneurysm. Electronically Signed   By: Nelson Chimes M.D.   On: 11/19/2016 21:41    Verner Mould, MD 11/20/2016, 8:47 AM PGY-2, [place] Intern pager: [phone], text pages welcome

## 2016-11-20 NOTE — Consult Note (Signed)
Referring Provider:  Dr. McDiarmid Primary Care Physician:  Ralene Ok, MD Primary Gastroenterologist:  Dr. Michail Sermon   Reason for Consultation:  Rule out upper GI bleed  HPI: Vincent Ochoa is a 60 y.o. male with past medical history of decompensated cirrhosis most likely from prior alcohol use,  history of esophageal varices requiring band ligation on 06/22/2016( Dr. Watt Climes), 08/17/2016 - by me,  history of BRTO for gastric varices admitted to the hospital with abdominal pain and worsening ascites and hematuria. GI is consulted for further evaluation and to rule out upper GI bleed  According to patient, he received injury from his upper dental plate and noted blood in the sputum after that yesterday. Since then, he has removed his dental plate. No more episodes of blood in the sputum. Patient denied any blood in the vomiting. Patient denied any black stool or bright red blood per rectum. Last bowel movement was today. Patient is complaining of worsening of ascites with abdominal distention and generalized abdominal pain. Also had mild fever. Under diagnosed with UTI on antibiotics. Denied any confusion. Denied dysphagia or odynophagia.  Patient also has a refractory  ascites requiring frequent paracentesis.  Had colonoscopy by Dr. Michail Sermon in November which showed fair prep.  Past Medical History:  Diagnosis Date  . Arthritis    "hands, knees, back" (07/05/2016)  . Back pain, lumbosacral 2014  . CHF (congestive heart failure) ([place])   . Cirrhosis ([place])   . Coronary artery disease    a. Evaluated by Dr. Stanford Breed 2012 - nonobstructive 2007-2008 by cath. b. Abnormal stress test 10/2014 but cath deferred temporarily due to thrombocytopenia. c. 10/2014: readm with AF-RVR/NSTEMI - s/p BMS to mLCx 11/04/14 (mod dz in RCA);  d. relook cath 01/2015 and 06/02/2015 patent stent, nonobs CAD, EF 55-65%.  . Elevated LFTs   . Facial cellulitis 2014   Periorbital  . Fatty liver US - 2012  . Frequent  headaches   . GERD (gastroesophageal reflux disease)   . History of blood transfusion 01/2016   "lost alot of blood vomiting"  . Hyperlipidemia   . Morbid obesity ([place])   . Obesity   . OSA on CPAP    severe with AHI 56/hr with successful CPAP titration to 18cm H2O  . PAF (paroxysmal atrial fibrillation) ([place])    a. Episode 10/2014 at time of NSTEMI, spont converted to NSR. Observation for further episodes (not on anticoag at present time due to [facility] = 1, thrombocytopenia).  . Psoriasis   . Sinus bradycardia   . Stroke [facility])    "I've been told I'd had a mini stroke on my left side" (07/05/2016)  . Thrombocytopenia ([place])    a. Onset unclear, but noted 2012. ? 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 s/p prednisone, IVIG.    Past Surgical History:  Procedure Laterality Date  . CARDIAC CATHETERIZATION    . CARDIAC CATHETERIZATION N/A 06/02/2015   Procedure: Left Heart Cath and Coronary Angiography;  Surgeon: Jettie Booze, MD;  Location: [facility] CV LAB;  Service: Cardiovascular;  Laterality: N/A;  . COLONOSCOPY WITH PROPOFOL N/A 09/26/2016   Procedure: COLONOSCOPY WITH PROPOFOL;  Surgeon: Wilford Corner, MD;  Location: [facility] ENDOSCOPY;  Service: Endoscopy;  Laterality: N/A;  . ESOPHAGOGASTRODUODENOSCOPY N/A 06/22/2016   Procedure: ESOPHAGOGASTRODUODENOSCOPY (EGD);  Surgeon: Clarene Essex, MD;  Location: [facility] ENDOSCOPY;  Service: Endoscopy;  Laterality: N/A;  . ESOPHAGOGASTRODUODENOSCOPY (EGD) WITH PROPOFOL N/A 08/17/2016   Procedure: ESOPHAGOGASTRODUODENOSCOPY (EGD) WITH PROPOFOL;  Surgeon: Otis Brace, MD;  Location: [facility];  Service: Gastroenterology;  Laterality: N/A;  . FACIAL COSMETIC SURGERY  1990s   "rodeo-related injury"  . IR GENERIC HISTORICAL  09/14/2016   IR RADIOLOGIST EVAL & MGMT 09/14/2016 Sandi Mariscal, MD [facility] INTERV RAD  . KNEE ARTHROPLASTY Left 1970's   "put cartilage in it"  . LAPAROSCOPIC CHOLECYSTECTOMY  2007  . LEFT  HEART CATHETERIZATION WITH CORONARY ANGIOGRAM N/A 11/04/2014   Procedure: LEFT HEART CATHETERIZATION WITH CORONARY ANGIOGRAM;  Surgeon: Jettie Booze, MD;  Location: [facility] CATH LAB;  Service: Cardiovascular;  Laterality: N/A;  . LEFT HEART CATHETERIZATION WITH CORONARY ANGIOGRAM N/A 01/28/2015   Procedure: LEFT HEART CATHETERIZATION WITH CORONARY ANGIOGRAM;  Surgeon: Sherren Mocha, MD;  Location: [facility] CATH LAB;  Service: Cardiovascular;  Laterality: N/A;  . liver cirrhosis    . RADIOLOGY WITH ANESTHESIA N/A 02/04/2016   Procedure: RADIOLOGY WITH ANESTHESIA;  Surgeon: Medication Radiologist, MD;  Location: [facility];  Service: Radiology;  Laterality: N/A;  . TONSILLECTOMY  1960's    Prior to Admission medications   Medication Sig Start Date End Date Taking? Authorizing Provider  atorvastatin (LIPITOR) 40 MG tablet Take 1 tablet (40 mg total) by mouth daily at 8 pm. Patient taking differently: Take 40 mg by mouth daily.  06/23/16  Yes Everrett Coombe, MD  buPROPion (WELLBUTRIN XL) 150 MG 24 hr tablet Take 1 tablet (150 mg total) by mouth daily. 11/01/16  Yes Carlyle Dolly, MD  carvedilol (COREG) 3.125 MG tablet Take 1 tablet (3.125 mg total) by mouth 2 (two) times daily with a meal. 11/01/16  Yes Carlyle Dolly, MD  eltrombopag (PROMACTA) 50 MG tablet Take 1 tablet (50 mg total) by mouth daily. Take on an empty stomach 1 hour before a meal or 2 hours after Patient taking differently: Take 100 mg by mouth daily. Take on an empty stomach 1 hour before a meal or 2 hours after 11/10/16  Yes Heath Lark, MD  furosemide (LASIX) 40 MG tablet Take 0.5 tablets (20 mg total) by mouth 2 (two) times daily. Patient taking differently: Take 40 mg by mouth 2 (two) times daily.  11/01/16  Yes Carlyle Dolly, MD  isosorbide mononitrate (IMDUR) 30 MG 24 hr tablet Take 1 tablet (30 mg total) by mouth daily. 11/01/16  Yes Carlyle Dolly, MD  nitroGLYCERIN (NITROSTAT) 0.4 MG SL tablet Place 1 tablet (0.4 mg  total) under the tongue every 5 (five) minutes as needed for chest pain (up to 3 doses). 10/31/14  Yes Dayna N Dunn, PA-C  pantoprazole (PROTONIX) 40 MG tablet Take 1 tablet (40 mg total) by mouth 2 (two) times daily. 11/01/16  Yes Carlyle Dolly, MD  spironolactone (ALDACTONE) 25 MG tablet Take 2 tablets (50 mg total) by mouth daily. 11/01/16  Yes Carlyle Dolly, MD  sucralfate (CARAFATE) 1 g tablet Take 1 tablet (1 g total) by mouth 4 (four) times daily -  with meals and at bedtime. Dissolve in water. Patient taking differently: Take 1 g by mouth 3 (three) times daily as needed (stomach irritation). Dissolve in water. 08/19/16  Yes Sela Hilding, MD    Scheduled Meds: . atorvastatin  40 mg Oral Daily  . buPROPion  150 mg Oral Daily  . carvedilol  3.125 mg Oral BID WC  . cefTRIAXone (ROCEPHIN)  IV  2 g Intravenous Q24H  . eltrombopag  25 mg Oral Daily  . furosemide  40 mg Oral BID  . isosorbide mononitrate  30  mg Oral Daily  . pantoprazole  40 mg Oral BID  . spironolactone  50 mg Oral Daily  . SUMAtriptan  25 mg Oral Once   Continuous Infusions: PRN Meds:.nitroGLYCERIN, ondansetron **OR** ondansetron (ZOFRAN) IV, sucralfate, traMADol  Allergies as of 11/19/2016 - Review Complete 11/19/2016  Allergen Reaction Noted  . Asa [aspirin] Other (See Comments) 08/16/2016  . Eliquis [apixaban] Other (See Comments) 08/16/2016  . Nsaids Other (See Comments) 08/16/2016  . Plavix [clopidogrel] Other (See Comments) 08/16/2016  . Statins Other (See Comments) 08/16/2016    Family History  Problem Relation Age of Onset  . Cirrhosis Mother   . Heart attack Mother   . Hypertension Mother   . Hypertension Father   . Stroke Father   . Arthritis Maternal Grandmother   . Heart disease Sister   . Diabetes Sister   . Cancer - Ovarian Sister   . Heart disease      Maternal/paternal grandparents  . Heart attack Maternal Aunt     Social History   Social History  . Marital status:  Married    Spouse name: N/A  . Number of children: N/A  . Years of education: N/A   Occupational History  . Not on file.   Social History Main Topics  . Smoking status: Former Smoker    Packs/day: 1.50    Years: 43.00    Types: Cigarettes    Quit date: 02/19/2016  . Smokeless tobacco: Former Systems developer  . Alcohol use 0.0 oz/week     Comment: 07/05/2016 "mostly stopped in 1979; might have a beer q 6 months or so"  . Drug use: No  . Sexual activity: Not Currently   Other Topics Concern  . Not on file   Social History Narrative   Lives in [place].    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     Review of Systems:  Review of Systems  Constitutional: Positive for chills and fever.  HENT: Negative for ear discharge and ear pain.   Eyes: Negative for double vision and discharge.  Respiratory: Positive for cough, hemoptysis and shortness of breath.   Cardiovascular: Negative for chest pain.  Gastrointestinal: Positive for abdominal pain. Negative for blood in stool, melena and vomiting.  Genitourinary: Positive for frequency and hematuria.  Skin: Negative for itching and rash.  Neurological: Positive for weakness. Negative for focal weakness and seizures.  Endo/Heme/Allergies: Bruises/bleeds easily.  Psychiatric/Behavioral: Negative for hallucinations and suicidal ideas.    Physical Exam: Vital signs: Vitals:   11/20/16 0529 11/20/16 1309  BP: 110/63 (!) 107/59  Pulse: 78 66  Resp: 18 18  Temp: 98.1 F (36.7 C) 98.4 F (36.9 C)   Last BM Date: 11/19/16 General:   Alert,  Well-developed, In mild distress from abdominal distention Head: NS, AT  Eyes: Eomi. mild scleral icterus noted Lungs:  Fine bibasilar crackles. Heart:  Regular rate and rhythm; no murmurs, clicks, rubs,  or gallops. Abdomen: Distended with tense ascites, generalized discomfort on palpation with tenderness over left upper  quadrant area. Bowel sounds present. LE: no edema   GI:  Lab Results:  Recent Labs  11/19/16 1728 11/20/16 0727 11/20/16 1533  WBC 5.4 3.4* 3.4*  HGB 11.0* 9.9* 9.7*  HCT 32.7* 29.4* 29.1*  PLT 63* 50* 52*   BMET  Recent Labs  11/19/16 1728 11/20/16 0727  NA 136 133*  K 4.0 3.7  CL 103 103  CO2 25 27  GLUCOSE 105* 90  BUN 11 9  CREATININE 1.12 0.96  CALCIUM 8.6* 8.2*   LFT  Recent Labs  11/19/16 2008 11/20/16 1320  PROT 6.6  --   ALBUMIN 2.5* 2.4*  AST 70*  --   ALT 46  --   ALKPHOS 90  --   BILITOT 2.9*  --   BILIDIR 0.8*  --   IBILI 2.1*  --    PT/INR  Recent Labs  11/19/16 2008 11/20/16 0727  LABPROT 18.0* 18.3*  INR 1.48 1.50     Studies/Results: Dg Chest 2 View  Result Date: 11/19/2016 CLINICAL DATA:  Patient with right lower quadrant pain for 2 days. Shortness of breath. EXAM: CHEST  2 VIEW COMPARISON:  Chest radiograph 10/31/2016. FINDINGS: Stable cardiac and mediastinal contours. No consolidative pulmonary opacities. No pleural effusion or pneumothorax. Regional skeleton is unremarkable. IMPRESSION: No active cardiopulmonary disease. Electronically Signed   By: Lovey Newcomer M.D.   On: 11/19/2016 18:22   Ct Abdomen Pelvis W Contrast  Result Date: 11/19/2016 CLINICAL DATA:  Worsening of abdominal swelling. Cirrhosis and ascites. Sudden onset of right lower quadrant pain. EXAM: CT ABDOMEN AND PELVIS WITH CONTRAST TECHNIQUE: Multidetector CT imaging of the abdomen and pelvis was performed using the standard protocol following bolus administration of intravenous contrast. CONTRAST:  175mL ISOVUE-300 IOPAMIDOL (ISOVUE-300) INJECTION 61% COMPARISON:  09/13/2016 FINDINGS: Lower chest: Normal Hepatobiliary: Advanced cirrhosis of the liver. Small liver with nodular surface and relative prominence of the left lobe an uncinate process. No focal lesions seen. Previous cholecystectomy. Pancreas: Normal Spleen: Splenomegaly consistent with portal venous  hypertension. Adrenals/Urinary Tract: No adrenal mass. Renal parenchyma is normal. 7 mm stone lower pole right kidney. 4 mm stone midportion right kidney. Stomach/Bowel: No acute bowel finding. Vascular/Lymphatic: Aortic atherosclerosis. Infrarenal abdominal aortic aneurysm maximal transverse diameter 3.1 cm. IVC is normal. Extensive upper abdominal varices. Reproductive: Negative Other: Very large amount of ascites, freely distributed. Markedly increased since 2 months ago. Musculoskeletal: No acute finding. Ordinary spinal degenerative changes. IMPRESSION: Massive increase in ascites, freely distributed. No other change. Cirrhosis, splenomegaly and upper abdominal varices. Nonobstructing renal calculi on the right. 3.1 cm infrarenal abdominal aortic aneurysm. Electronically Signed   By: Nelson Chimes M.D.   On: 11/19/2016 21:41    Impression/Plan: Hemoptysis. Patient denied any vomiting of blood. Refractory ascites. Requiring frequent paracentesis. Abdominal pain. Concern for SBP. History of esophageal varices. Status post band ligation 06/22/2016, 08/17/2016  History of gastric varices status post BX RTO Decompensated cirrhosis.Marland Kitchen ?? Prior alcohol use. Hepatitis panel negative in the past.  Portal vein thrombus. Chronic Hematuria with UTI   Recommendations ------------------------ - Patient presentation is consistent with hemoptysis not hematemesis. - Paracentesis as ordered by the primary team. - Continue Lasix 40 mg twice a day. Increase spironolactone to 100 mg - Continue antibiotics for now. - Recommend EGD for band ligation prior to discharge. - Monitor hemoglobin, kidney function and LFTs. - GI will follow.    LOS: 1 day   Otis Brace  MD, FACP 11/20/2016, 6:17 PM  Pager [phone] If no answer or after 5 PM call [phone]

## 2016-11-21 ENCOUNTER — Inpatient Hospital Stay (HOSPITAL_COMMUNITY): Payer: Medicaid Other

## 2016-11-21 LAB — BODY FLUID CELL COUNT WITH DIFFERENTIAL
Eos, Fluid: 0 %
Lymphs, Fluid: 36 %
Monocyte-Macrophage-Serous Fluid: 62 % (ref 50–90)
NEUTROPHIL FLUID: 2 % (ref 0–25)
WBC FLUID: 69 uL (ref 0–1000)

## 2016-11-21 LAB — COMPREHENSIVE METABOLIC PANEL
ALT: 40 U/L (ref 17–63)
AST: 57 U/L — AB (ref 15–41)
Albumin: 2.2 g/dL — ABNORMAL LOW (ref 3.5–5.0)
Alkaline Phosphatase: 84 U/L (ref 38–126)
Anion gap: 7 (ref 5–15)
BILIRUBIN TOTAL: 1.4 mg/dL — AB (ref 0.3–1.2)
BUN: 10 mg/dL (ref 6–20)
CHLORIDE: 101 mmol/L (ref 101–111)
CO2: 25 mmol/L (ref 22–32)
Calcium: 7.9 mg/dL — ABNORMAL LOW (ref 8.9–10.3)
Creatinine, Ser: 1.04 mg/dL (ref 0.61–1.24)
Glucose, Bld: 96 mg/dL (ref 65–99)
POTASSIUM: 3.7 mmol/L (ref 3.5–5.1)
Sodium: 133 mmol/L — ABNORMAL LOW (ref 135–145)
Total Protein: 6.1 g/dL — ABNORMAL LOW (ref 6.5–8.1)

## 2016-11-21 LAB — GRAM STAIN

## 2016-11-21 LAB — CBC
HEMATOCRIT: 29.5 % — AB (ref 39.0–52.0)
Hemoglobin: 9.9 g/dL — ABNORMAL LOW (ref 13.0–17.0)
MCH: 30.2 pg (ref 26.0–34.0)
MCHC: 33.6 g/dL (ref 30.0–36.0)
MCV: 89.9 fL (ref 78.0–100.0)
PLATELETS: 50 10*3/uL — AB (ref 150–400)
RBC: 3.28 MIL/uL — ABNORMAL LOW (ref 4.22–5.81)
RDW: 15.3 % (ref 11.5–15.5)
WBC: 3.9 10*3/uL — ABNORMAL LOW (ref 4.0–10.5)

## 2016-11-21 LAB — PROTEIN, BODY FLUID: Total protein, fluid: <3 g/dL

## 2016-11-21 LAB — LACTATE DEHYDROGENASE, PLEURAL OR PERITONEAL FLUID: LD, Fluid: 33 U/L — ABNORMAL HIGH (ref 3–23)

## 2016-11-21 LAB — GLUCOSE, SEROUS FLUID: Glucose, Fluid: 104 mg/dL

## 2016-11-21 LAB — ALBUMIN, FLUID (OTHER)

## 2016-11-21 MED ORDER — ALBUMIN HUMAN 25 % IV SOLN
25.0000 g | Freq: Four times a day (QID) | INTRAVENOUS | Status: AC
  Administered 2016-11-21 – 2016-11-22 (×4): 25 g via INTRAVENOUS
  Filled 2016-11-21: qty 100
  Filled 2016-11-21 (×3): qty 50
  Filled 2016-11-21: qty 100

## 2016-11-21 MED ORDER — LIDOCAINE HCL (PF) 1 % IJ SOLN
INTRAMUSCULAR | Status: AC
  Filled 2016-11-21: qty 10

## 2016-11-21 NOTE — Procedures (Signed)
Ultrasound-guided diagnostic and therapeutic paracentesis performed yielding 14.4 liters of light yellow  fluid. No immediate complications. A portion of the fluid was submitted to the lab for preordered studies. Patient to undergo evaluation by our service  for possible TIPS .

## 2016-11-21 NOTE — Progress Notes (Signed)
Family Medicine Teaching Service Daily Progress Note Intern Pager: [phone]  Patient name: Vincent Ochoa Medical record number: [id number] Date of birth: 1956-05-21 Age: 61 y.o. Gender: male  Primary Care Provider: Ralene Ok, MD Consultants: IR, GI Code Status: DNR   Pt Overview and Major Events to Date:  12/30 - admitted to FPTS  Assessment and Plan: Vincent Ochoa is a 61 y.o. male presenting with hematuria and abdominal pain . PMH is significant for SBP, alcoholic cirrhosis of liver with ascites, CAD with NSTEMI, psoriasis, thrombocytopenia, OSA, obesity, CHF, GERD, depression, and PAF.   #Abdominal pain 2/2 cirrhosis of liver with ascites: tense abdomen with tenderness to percussion started on antibiotics out of concern for SBP. Without signs and symptoms of infection.  -GI on board. EGD prior to discharge.  -US guided diagnostic and therapeutic para.  -Will continue Rocephin 2g Q24h -AM CBC, BMET -APTT, Protime-INR -Tramadol 50 mg po Q6h for pain control -Zofran 4 mg po Q6 prn -Vitals per unit routine   #Hematuria.  UA +for gross hematuria, neg nitrite and trace leuks . Hx of kidney stones in past.  CT A/P with no signs of perforated viscus but with non-obstruction kidney stone noted.  With no signs of infection on UA, less concern for UTI.  Coagulopathy could also possibly be contributing. Hgb stable at 11.0 at this time.  -Continue to monitor -Will likely need urology outpatient f/u on discharge  #PAF.  On problem list.  Not on pharmacologic treatment currently. A.fib not present on ED EKG.  -Continue to monitor  #GERD.  Stable.  At home on Protonix 40 mg po BID and sucralfate 1g PO TID.  -Continue home regimen  #CHF.  Stable.  Echo 10/2014 with EF 60-65%.  At home on Lasix and Spironolactone.  -Continue Lasix 40 mg daily -continue Spironolactone 50 mg oral daily  #CAD.  S/p Left heart cath July 2016 showing nonobstructive coronary artery disease.  Mid cx  lesion previously treated with a bare metal stent in 10/2014.  Follows with Dr. Burt Knack outpatient.  -Continue Coreg 3.125 mg po BID -continue Lipitor 40 mg po daily -continue Imdur 30 mg po daily  #Pancytopenia: stable. likely sequestration due to liver disease. RDW normal making production issue unlikely. Plt at baseline ~50.  At home on Promacta 100 mg po daily.   -Continue home regimen  -Monitor  #OSA.  Stable.  Uses CPAP at home.  -CPAP ordered  #Depression.  Stable.  At home on Wellbutrin.   -Wellbutrin 150 mg po daily  FEN/GI:  -Heart healthy -Feed after Para  Prophylaxis: Protonix, SCD, SLIV  Subjective:  Patient upset about being NPO this long (10 am). He was kept NPO by GI. Called and discussed with Dr. Amedeo Plenty. They are not thinking about EGD today so okay to feed him.  Objective: Temp:  [98 F (36.7 C)-98.6 F (37 C)] 98 F (36.7 C) (01/01 0525) Pulse Rate:  [66-78] 75 (01/01 0525) Resp:  [16-20] 20 (01/01 0525) BP: (105-112)/(54-63) 107/54 (01/01 0525) SpO2:  [95 %-99 %] 95 % (01/01 0525) Physical Exam: General: lying in bed, awake and alert, upset about NPO but very pleasant Cardiovascular: RRR, no murmurs appreciated Respiratory: CTAB, normal WOB on 2L [place], no whezes Abdomen: extremely distended and firm, very TTP with LUQ most tender, +BS Extremities: moving all spontaneously, no LE edema Skin: some scatter spider angioma's Neuro: A&Ox3, no focal deficits Psych: appropriate mood and affect   Laboratory:  Recent Labs Lab 11/20/16 0727  11/20/16 1533 11/21/16 0405  WBC 3.4* 3.4* 3.9*  HGB 9.9* 9.7* 9.9*  HCT 29.4* 29.1* 29.5*  PLT 50* 52* 50*    Recent Labs Lab 11/19/16 1728 11/19/16 2008 11/20/16 0727 11/21/16 0405  NA 136  --  133* 133*  K 4.0  --  3.7 3.7  CL 103  --  103 101  CO2 25  --  27 25  BUN 11  --  9 10  CREATININE 1.12  --  0.96 1.04  CALCIUM 8.6*  --  8.2* 7.9*  PROT  --  6.6  --  6.1*  BILITOT  --  2.9*  --  1.4*   ALKPHOS  --  90  --  84  ALT  --  46  --  40  AST  --  70*  --  57*  GLUCOSE 105*  --  90 96    Imaging/Diagnostic Tests: Dg Chest 2 View  Result Date: 11/19/2016 CLINICAL DATA:  Patient with right lower quadrant pain for 2 days. Shortness of breath. EXAM: CHEST  2 VIEW COMPARISON:  Chest radiograph 10/31/2016. FINDINGS: Stable cardiac and mediastinal contours. No consolidative pulmonary opacities. No pleural effusion or pneumothorax. Regional skeleton is unremarkable. IMPRESSION: No active cardiopulmonary disease. Electronically Signed   By: Lovey Newcomer M.D.   On: 11/19/2016 18:22   Ct Abdomen Pelvis W Contrast  Result Date: 11/19/2016 CLINICAL DATA:  Worsening of abdominal swelling. Cirrhosis and ascites. Sudden onset of right lower quadrant pain. EXAM: CT ABDOMEN AND PELVIS WITH CONTRAST TECHNIQUE: Multidetector CT imaging of the abdomen and pelvis was performed using the standard protocol following bolus administration of intravenous contrast. CONTRAST:  131mL ISOVUE-300 IOPAMIDOL (ISOVUE-300) INJECTION 61% COMPARISON:  09/13/2016 FINDINGS: Lower chest: Normal Hepatobiliary: Advanced cirrhosis of the liver. Small liver with nodular surface and relative prominence of the left lobe an uncinate process. No focal lesions seen. Previous cholecystectomy. Pancreas: Normal Spleen: Splenomegaly consistent with portal venous hypertension. Adrenals/Urinary Tract: No adrenal mass. Renal parenchyma is normal. 7 mm stone lower pole right kidney. 4 mm stone midportion right kidney. Stomach/Bowel: No acute bowel finding. Vascular/Lymphatic: Aortic atherosclerosis. Infrarenal abdominal aortic aneurysm maximal transverse diameter 3.1 cm. IVC is normal. Extensive upper abdominal varices. Reproductive: Negative Other: Very large amount of ascites, freely distributed. Markedly increased since 2 months ago. Musculoskeletal: No acute finding. Ordinary spinal degenerative changes. IMPRESSION: Massive increase in  ascites, freely distributed. No other change. Cirrhosis, splenomegaly and upper abdominal varices. Nonobstructing renal calculi on the right. 3.1 cm infrarenal abdominal aortic aneurysm. Electronically Signed   By: Nelson Chimes M.D.   On: 11/19/2016 21:41    Mercy Riding, MD 11/21/2016, 9:10 AM PGY-2, [place] Intern pager: [phone], text pages welcome

## 2016-11-21 NOTE — Progress Notes (Signed)
Contacted family medicine regarding NPO status and scheduled meds.  On call MD stated its okay to give 0600 meds with sips of water.

## 2016-11-21 NOTE — Progress Notes (Signed)
[facility] Progress Note  Subjective: Complaining of general abdominal pain and distention, unable to do paracentesis last night. Denies vomiting any blood in the time.  Objective: Vital signs in last 24 hours: Temp:  [98 F (36.7 C)-98.6 F (37 C)] 98 F (36.7 C) (01/01 0525) Pulse Rate:  [66-78] 75 (01/01 0525) Resp:  [16-20] 20 (01/01 0525) BP: (105-112)/(54-63) 107/54 (01/01 0525) SpO2:  [95 %-99 %] 95 % (01/01 0525) Weight change:    PE: Abdomen grossly and symmetrically distended, patient is alert and oriented  Lab Results: Results for orders placed or performed during the hospital encounter of 11/19/16 (from the past 24 hour(s))  Lactate dehydrogenase     Status: Abnormal   Collection Time: 11/20/16  1:20 PM  Result Value Ref Range   LDH 223 (H) 98 - 192 U/L  Albumin     Status: Abnormal   Collection Time: 11/20/16  1:20 PM  Result Value Ref Range   Albumin 2.4 (L) 3.5 - 5.0 g/dL  CBC     Status: Abnormal   Collection Time: 11/20/16  3:33 PM  Result Value Ref Range   WBC 3.4 (L) 4.0 - 10.5 K/uL   RBC 3.22 (L) 4.22 - 5.81 MIL/uL   Hemoglobin 9.7 (L) 13.0 - 17.0 g/dL   HCT 29.1 (L) 39.0 - 52.0 %   MCV 90.4 78.0 - 100.0 fL   MCH 30.1 26.0 - 34.0 pg   MCHC 33.3 30.0 - 36.0 g/dL   RDW 15.4 11.5 - 15.5 %   Platelets 52 (L) 150 - 400 K/uL  Type and screen [place]     Status: None   Collection Time: 11/20/16  3:40 PM  Result Value Ref Range   ABO/RH(D) A NEG    Antibody Screen NEG    Sample Expiration 11/23/2016   CBC     Status: Abnormal   Collection Time: 11/21/16  4:05 AM  Result Value Ref Range   WBC 3.9 (L) 4.0 - 10.5 K/uL   RBC 3.28 (L) 4.22 - 5.81 MIL/uL   Hemoglobin 9.9 (L) 13.0 - 17.0 g/dL   HCT 29.5 (L) 39.0 - 52.0 %   MCV 89.9 78.0 - 100.0 fL   MCH 30.2 26.0 - 34.0 pg   MCHC 33.6 30.0 - 36.0 g/dL   RDW 15.3 11.5 - 15.5 %   Platelets 50 (L) 150 - 400 K/uL  Comprehensive metabolic panel     Status: Abnormal    Collection Time: 11/21/16  4:05 AM  Result Value Ref Range   Sodium 133 (L) 135 - 145 mmol/L   Potassium 3.7 3.5 - 5.1 mmol/L   Chloride 101 101 - 111 mmol/L   CO2 25 22 - 32 mmol/L   Glucose, Bld 96 65 - 99 mg/dL   BUN 10 6 - 20 mg/dL   Creatinine, Ser 1.04 0.61 - 1.24 mg/dL   Calcium 7.9 (L) 8.9 - 10.3 mg/dL   Total Protein 6.1 (L) 6.5 - 8.1 g/dL   Albumin 2.2 (L) 3.5 - 5.0 g/dL   AST 57 (H) 15 - 41 U/L   ALT 40 17 - 63 U/L   Alkaline Phosphatase 84 38 - 126 U/L   Total Bilirubin 1.4 (H) 0.3 - 1.2 mg/dL   GFR calc non Af Amer >60 >60 mL/min   GFR calc Af Amer >60 >60 mL/min   Anion gap 7 5 - 15    Studies/Results: Dg Chest 2 View  Result Date: 11/19/2016  CLINICAL DATA:  Patient with right lower quadrant pain for 2 days. Shortness of breath. EXAM: CHEST  2 VIEW COMPARISON:  Chest radiograph 10/31/2016. FINDINGS: Stable cardiac and mediastinal contours. No consolidative pulmonary opacities. No pleural effusion or pneumothorax. Regional skeleton is unremarkable. IMPRESSION: No active cardiopulmonary disease. Electronically Signed   By: Lovey Newcomer M.D.   On: 11/19/2016 18:22   Ct Abdomen Pelvis W Contrast  Result Date: 11/19/2016 CLINICAL DATA:  Worsening of abdominal swelling. Cirrhosis and ascites. Sudden onset of right lower quadrant pain. EXAM: CT ABDOMEN AND PELVIS WITH CONTRAST TECHNIQUE: Multidetector CT imaging of the abdomen and pelvis was performed using the standard protocol following bolus administration of intravenous contrast. CONTRAST:  139mL ISOVUE-300 IOPAMIDOL (ISOVUE-300) INJECTION 61% COMPARISON:  09/13/2016 FINDINGS: Lower chest: Normal Hepatobiliary: Advanced cirrhosis of the liver. Small liver with nodular surface and relative prominence of the left lobe an uncinate process. No focal lesions seen. Previous cholecystectomy. Pancreas: Normal Spleen: Splenomegaly consistent with portal venous hypertension. Adrenals/Urinary Tract: No adrenal mass. Renal parenchyma is  normal. 7 mm stone lower pole right kidney. 4 mm stone midportion right kidney. Stomach/Bowel: No acute bowel finding. Vascular/Lymphatic: Aortic atherosclerosis. Infrarenal abdominal aortic aneurysm maximal transverse diameter 3.1 cm. IVC is normal. Extensive upper abdominal varices. Reproductive: Negative Other: Very large amount of ascites, freely distributed. Markedly increased since 2 months ago. Musculoskeletal: No acute finding. Ordinary spinal degenerative changes. IMPRESSION: Massive increase in ascites, freely distributed. No other change. Cirrhosis, splenomegaly and upper abdominal varices. Nonobstructing renal calculi on the right. 3.1 cm infrarenal abdominal aortic aneurysm. Electronically Signed   By: Nelson Chimes M.D.   On: 11/19/2016 21:41      Assessment: 1. Decompensated cirrhosis presumably due to alcohol 2. Tense ascites, rule out SBP 3. History of esophageal varices status post banding 2 in 2017 last done in September  Plan: 1. Await diagnostic and therapeutic paracentesis 2. Continue antibiotics 3. Continue diuretics 4. EGD with esophageal banding prior to discharge.    Vincent Ochoa C 11/21/2016, 8:12 AM  Pager [phone] If no answer or after 5 PM call [phone]

## 2016-11-22 ENCOUNTER — Other Ambulatory Visit: Payer: Medicaid Other

## 2016-11-22 DIAGNOSIS — R319 Hematuria, unspecified: Secondary | ICD-10-CM

## 2016-11-22 DIAGNOSIS — K7031 Alcoholic cirrhosis of liver with ascites: Principal | ICD-10-CM

## 2016-11-22 DIAGNOSIS — K652 Spontaneous bacterial peritonitis: Secondary | ICD-10-CM

## 2016-11-22 LAB — COMPREHENSIVE METABOLIC PANEL
ALBUMIN: 2.9 g/dL — AB (ref 3.5–5.0)
ALT: 35 U/L (ref 17–63)
ANION GAP: 5 (ref 5–15)
AST: 53 U/L — ABNORMAL HIGH (ref 15–41)
Alkaline Phosphatase: 80 U/L (ref 38–126)
BUN: 10 mg/dL (ref 6–20)
CHLORIDE: 100 mmol/L — AB (ref 101–111)
CO2: 27 mmol/L (ref 22–32)
CREATININE: 1.01 mg/dL (ref 0.61–1.24)
Calcium: 8.2 mg/dL — ABNORMAL LOW (ref 8.9–10.3)
GFR calc Af Amer: >60 mL/min (ref >60)
GFR calc non Af Amer: >60 mL/min (ref >60)
Glucose, Bld: 90 mg/dL (ref 65–99)
Potassium: 3.9 mmol/L (ref 3.5–5.1)
SODIUM: 132 mmol/L — AB (ref 135–145)
Total Bilirubin: 1.4 mg/dL — ABNORMAL HIGH (ref 0.3–1.2)
Total Protein: 6.4 g/dL — ABNORMAL LOW (ref 6.5–8.1)

## 2016-11-22 LAB — URINALYSIS, ROUTINE W REFLEX MICROSCOPIC
BACTERIA UA: NONE SEEN
BILIRUBIN URINE: NEGATIVE
Glucose, UA: NEGATIVE mg/dL
KETONES UR: NEGATIVE mg/dL
NITRITE: NEGATIVE
Protein, ur: NEGATIVE mg/dL
Specific Gravity, Urine: 1.006 (ref 1.005–1.030)
Squamous Epithelial / LPF: NONE SEEN
pH: 6 (ref 5.0–8.0)

## 2016-11-22 LAB — CBC
HCT: 29.5 % — ABNORMAL LOW (ref 39.0–52.0)
Hemoglobin: 10 g/dL — ABNORMAL LOW (ref 13.0–17.0)
MCH: 31.1 pg (ref 26.0–34.0)
MCHC: 33.9 g/dL (ref 30.0–36.0)
MCV: 91.6 fL (ref 78.0–100.0)
PLATELETS: 48 10*3/uL — AB (ref 150–400)
RBC: 3.22 MIL/uL — AB (ref 4.22–5.81)
RDW: 15.3 % (ref 11.5–15.5)
WBC: 3.5 10*3/uL — AB (ref 4.0–10.5)

## 2016-11-22 MED ORDER — BACLOFEN 10 MG PO TABS
5.0000 mg | ORAL_TABLET | Freq: Two times a day (BID) | ORAL | Status: DC | PRN
  Administered 2016-11-22: 5 mg via ORAL
  Filled 2016-11-22 (×2): qty 1

## 2016-11-22 NOTE — Consult Note (Signed)
Chief Complaint: Patient was seen in consultation today for ascites  Referring Physician(s):  Dr. Teena Irani  Supervising Physician: Jacqulynn Cadet  Patient Status: [facility] - In-pt  History of Present Illness: Vincent Ochoa is a 61 y.o. male with past medical history of decompensated cirrhosis, esophageal varices requiring band ligation August and September 2017, s/p BRTO for gastric varices admitted to [facility] 12/30 with abdominal pain, ascites, and hematuria.  Patient is s/p 14 liter paracentesis 11/21/16.  After latest large volume paracentesis, it was suggested that patient may benefit from a TIPS procedure.  Discussed TIPS with patient and wife today.    Patient has been seen at [facility] in the past for procedures and follow-up.  Discussed case with Dr. Watt Climes who states patient will also need an endoscopy and possible banding prior to discharge.   Past Medical History:  Diagnosis Date  . Arthritis    "hands, knees, back" (07/05/2016)  . Back pain, lumbosacral 2014  . CHF (congestive heart failure) ([place])   . Cirrhosis ([place])   . Coronary artery disease    a. Evaluated by Dr. Stanford Breed 2012 - nonobstructive 2007-2008 by cath. b. Abnormal stress test 10/2014 but cath deferred temporarily due to thrombocytopenia. c. 10/2014: readm with AF-RVR/NSTEMI - s/p BMS to mLCx 11/04/14 (mod dz in RCA);  d. relook cath 01/2015 and 06/02/2015 patent stent, nonobs CAD, EF 55-65%.  . Elevated LFTs   . Facial cellulitis 2014   Periorbital  . Fatty liver US - 2012  . Frequent headaches   . GERD (gastroesophageal reflux disease)   . History of blood transfusion 01/2016   "lost alot of blood vomiting"  . Hyperlipidemia   . Morbid obesity ([place])   . Obesity   . OSA on CPAP    severe with AHI 56/hr with successful CPAP titration to 18cm H2O  . PAF (paroxysmal atrial fibrillation) ([place])    a. Episode 10/2014 at time of NSTEMI, spont converted to NSR. Observation for further episodes (not on anticoag at  present time due to [facility] = 1, thrombocytopenia).  . Psoriasis   . Sinus bradycardia   . Stroke [facility])    "I've been told I'd had a mini stroke on my left side" (07/05/2016)  . Thrombocytopenia ([place])    a. Onset unclear, but noted 2012. ? 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 s/p prednisone, IVIG.    Past Surgical History:  Procedure Laterality Date  . CARDIAC CATHETERIZATION    . CARDIAC CATHETERIZATION N/A 06/02/2015   Procedure: Left Heart Cath and Coronary Angiography;  Surgeon: Jettie Booze, MD;  Location: [place] CV LAB;  Service: Cardiovascular;  Laterality: N/A;  . COLONOSCOPY WITH PROPOFOL N/A 09/26/2016   Procedure: COLONOSCOPY WITH PROPOFOL;  Surgeon: Wilford Corner, MD;  Location: [facility] ENDOSCOPY;  Service: Endoscopy;  Laterality: N/A;  . ESOPHAGOGASTRODUODENOSCOPY N/A 06/22/2016   Procedure: ESOPHAGOGASTRODUODENOSCOPY (EGD);  Surgeon: Clarene Essex, MD;  Location: [facility] ENDOSCOPY;  Service: Endoscopy;  Laterality: N/A;  . ESOPHAGOGASTRODUODENOSCOPY (EGD) WITH PROPOFOL N/A 08/17/2016   Procedure: ESOPHAGOGASTRODUODENOSCOPY (EGD) WITH PROPOFOL;  Surgeon: Otis Brace, MD;  Location: [place];  Service: Gastroenterology;  Laterality: N/A;  . FACIAL COSMETIC SURGERY  1990s   "rodeo-related injury"  . IR GENERIC HISTORICAL  09/14/2016   IR RADIOLOGIST EVAL & MGMT 09/14/2016 Sandi Mariscal, MD [facility] INTERV RAD  . KNEE ARTHROPLASTY Left 1970's   "put cartilage in it"  . LAPAROSCOPIC CHOLECYSTECTOMY  2007  . LEFT HEART  CATHETERIZATION WITH CORONARY ANGIOGRAM N/A 11/04/2014   Procedure: LEFT HEART CATHETERIZATION WITH CORONARY ANGIOGRAM;  Surgeon: Jettie Booze, MD;  Location: [facility] CATH LAB;  Service: Cardiovascular;  Laterality: N/A;  . LEFT HEART CATHETERIZATION WITH CORONARY ANGIOGRAM N/A 01/28/2015   Procedure: LEFT HEART CATHETERIZATION WITH CORONARY ANGIOGRAM;  Surgeon: Sherren Mocha, MD;  Location: [facility] CATH LAB;  Service:  Cardiovascular;  Laterality: N/A;  . liver cirrhosis    . RADIOLOGY WITH ANESTHESIA N/A 02/04/2016   Procedure: RADIOLOGY WITH ANESTHESIA;  Surgeon: Medication Radiologist, MD;  Location: [place];  Service: Radiology;  Laterality: N/A;  . TONSILLECTOMY  1960's    Allergies: Asa [aspirin]; Eliquis [apixaban]; Nsaids; Plavix [clopidogrel]; and Statins  Medications: Prior to Admission medications   Medication Sig Start Date End Date Taking? Authorizing Provider  atorvastatin (LIPITOR) 40 MG tablet Take 1 tablet (40 mg total) by mouth daily at 8 pm. Patient taking differently: Take 40 mg by mouth daily.  06/23/16  Yes Everrett Coombe, MD  buPROPion (WELLBUTRIN XL) 150 MG 24 hr tablet Take 1 tablet (150 mg total) by mouth daily. 11/01/16  Yes Carlyle Dolly, MD  carvedilol (COREG) 3.125 MG tablet Take 1 tablet (3.125 mg total) by mouth 2 (two) times daily with a meal. 11/01/16  Yes Carlyle Dolly, MD  eltrombopag (PROMACTA) 50 MG tablet Take 1 tablet (50 mg total) by mouth daily. Take on an empty stomach 1 hour before a meal or 2 hours after Patient taking differently: Take 100 mg by mouth daily. Take on an empty stomach 1 hour before a meal or 2 hours after 11/10/16  Yes Heath Lark, MD  furosemide (LASIX) 40 MG tablet Take 0.5 tablets (20 mg total) by mouth 2 (two) times daily. Patient taking differently: Take 40 mg by mouth 2 (two) times daily.  11/01/16  Yes Carlyle Dolly, MD  isosorbide mononitrate (IMDUR) 30 MG 24 hr tablet Take 1 tablet (30 mg total) by mouth daily. 11/01/16  Yes Carlyle Dolly, MD  nitroGLYCERIN (NITROSTAT) 0.4 MG SL tablet Place 1 tablet (0.4 mg total) under the tongue every 5 (five) minutes as needed for chest pain (up to 3 doses). 10/31/14  Yes Dayna N Dunn, PA-C  pantoprazole (PROTONIX) 40 MG tablet Take 1 tablet (40 mg total) by mouth 2 (two) times daily. 11/01/16  Yes Carlyle Dolly, MD  spironolactone (ALDACTONE) 25 MG tablet Take 2 tablets (50 mg  total) by mouth daily. 11/01/16  Yes Carlyle Dolly, MD  sucralfate (CARAFATE) 1 g tablet Take 1 tablet (1 g total) by mouth 4 (four) times daily -  with meals and at bedtime. Dissolve in water. Patient taking differently: Take 1 g by mouth 3 (three) times daily as needed (stomach irritation). Dissolve in water. 08/19/16  Yes Sela Hilding, MD     Family History  Problem Relation Age of Onset  . Cirrhosis Mother   . Heart attack Mother   . Hypertension Mother   . Hypertension Father   . Stroke Father   . Arthritis Maternal Grandmother   . Heart disease Sister   . Diabetes Sister   . Cancer - Ovarian Sister   . Heart disease      Maternal/paternal grandparents  . Heart attack Maternal Aunt     Social History   Social History  . Marital status: Married    Spouse name: N/A  . Number of children: N/A  . Years of education: N/A   Social History Main Topics  .  Smoking status: Former Smoker    Packs/day: 1.50    Years: 43.00    Types: Cigarettes    Quit date: 02/19/2016  . Smokeless tobacco: Former Systems developer  . Alcohol use 0.0 oz/week     Comment: 07/05/2016 "mostly stopped in 1979; might have a beer q 6 months or so"  . Drug use: No  . Sexual activity: Not Currently   Other Topics Concern  . None   Social History Narrative   Lives in [place].    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      Review of Systems: A 12 point ROS discussed and pertinent positives are indicated in the HPI above.  All other systems are negative.  Review of Systems  Constitutional: Negative for appetite change and fatigue.  Respiratory: Negative for cough and shortness of breath.   Cardiovascular: Negative for chest pain.  Gastrointestinal: Positive for abdominal distention.  Psychiatric/Behavioral: Negative for behavioral problems and confusion.    Vital Signs: BP (!) 102/53 (BP Location: Left Arm)   Pulse  65   Temp 98.5 F (36.9 C) (Oral)   Resp 18   Ht 6\' 3"  (1.905 m)   Wt 298 lb 11.2 oz (135.5 kg)   SpO2 97%   BMI 37.33 kg/m   Physical Exam  Constitutional: He is oriented to person, place, and time. He appears well-developed.  Cardiovascular: Normal rate and regular rhythm.   Pulmonary/Chest: Effort normal. No respiratory distress.  Abdominal: Soft. He exhibits no distension (s/p paracentesis).  Neurological: He is alert and oriented to person, place, and time.  Skin: Skin is warm and dry.  Psychiatric: He has a normal mood and affect. His behavior is normal. Judgment and thought content normal.  Nursing note and vitals reviewed.      Imaging: Dg Chest 2 View  Result Date: 11/19/2016 CLINICAL DATA:  Patient with right lower quadrant pain for 2 days. Shortness of breath. EXAM: CHEST  2 VIEW COMPARISON:  Chest radiograph 10/31/2016. FINDINGS: Stable cardiac and mediastinal contours. No consolidative pulmonary opacities. No pleural effusion or pneumothorax. Regional skeleton is unremarkable. IMPRESSION: No active cardiopulmonary disease. Electronically Signed   By: Lovey Newcomer M.D.   On: 11/19/2016 18:22   Dg Chest 2 View  Result Date: 10/31/2016 CLINICAL DATA:  Shortness of breath, swelling all over. EXAM: CHEST  2 VIEW COMPARISON:  10/26/2016 FINDINGS: Low lung volumes with bibasilar opacities, likely atelectasis. Heart is normal size. No effusions or acute bony abnormality. IMPRESSION: Low lung volumes, bibasilar atelectasis. Electronically Signed   By: Rolm Baptise M.D.   On: 10/31/2016 11:56   Dg Chest 2 View  Result Date: 10/26/2016 CLINICAL DATA:  Shortness of breath. Abdominal swelling. Cirrhosis. EXAM: CHEST  2 VIEW COMPARISON:  10/14/2016 FINDINGS: The heart size and pulmonary vascularity are normal and lungs are clear. No effusions. No bone abnormality. IMPRESSION: Normal chest. Electronically Signed   By: Lorriane Shire M.D.   On: 10/26/2016 17:19   Ct Abdomen Pelvis W  Contrast  Result Date: 11/19/2016 CLINICAL DATA:  Worsening of abdominal swelling. Cirrhosis and ascites. Sudden onset of right lower quadrant pain. EXAM: CT ABDOMEN AND PELVIS WITH CONTRAST TECHNIQUE: Multidetector CT imaging of the abdomen and pelvis was performed using the standard protocol following bolus administration of intravenous contrast. CONTRAST:  132mL ISOVUE-300 IOPAMIDOL (ISOVUE-300) INJECTION 61% COMPARISON:  09/13/2016 FINDINGS: Lower chest: Normal Hepatobiliary: Advanced  cirrhosis of the liver. Small liver with nodular surface and relative prominence of the left lobe an uncinate process. No focal lesions seen. Previous cholecystectomy. Pancreas: Normal Spleen: Splenomegaly consistent with portal venous hypertension. Adrenals/Urinary Tract: No adrenal mass. Renal parenchyma is normal. 7 mm stone lower pole right kidney. 4 mm stone midportion right kidney. Stomach/Bowel: No acute bowel finding. Vascular/Lymphatic: Aortic atherosclerosis. Infrarenal abdominal aortic aneurysm maximal transverse diameter 3.1 cm. IVC is normal. Extensive upper abdominal varices. Reproductive: Negative Other: Very large amount of ascites, freely distributed. Markedly increased since 2 months ago. Musculoskeletal: No acute finding. Ordinary spinal degenerative changes. IMPRESSION: Massive increase in ascites, freely distributed. No other change. Cirrhosis, splenomegaly and upper abdominal varices. Nonobstructing renal calculi on the right. 3.1 cm infrarenal abdominal aortic aneurysm. Electronically Signed   By: Nelson Chimes M.D.   On: 11/19/2016 21:41   US Paracentesis  Result Date: 11/21/2016 INDICATION: Cirrhosis, CHF, recurrent ascites, prior BRTO. Request made for diagnostic and therapeutic paracentesis. EXAM: ULTRASOUND GUIDED DIAGNOSTIC AND THERAPEUTIC PARACENTESIS MEDICATIONS: None. COMPLICATIONS: None immediate. PROCEDURE: Informed written consent was obtained from the patient after a discussion of the risks,  benefits and alternatives to treatment. A timeout was performed prior to the initiation of the procedure. Initial ultrasound scanning demonstrates a large amount of ascites within the right mid to lower abdominal quadrant. The right mid to lower abdomen was prepped and draped in the usual sterile fashion. 1% lidocaine was used for local anesthesia. Following this, a Yueh catheter was introduced. An ultrasound image was saved for documentation purposes. The paracentesis was performed. The catheter was removed and a dressing was applied. The patient tolerated the procedure well without immediate post procedural complication. FINDINGS: A total of approximately 14.4 liters of light yellow fluid was removed. Samples were sent to the laboratory as requested by the clinical team. IMPRESSION: Successful ultrasound-guided diagnostic and therapeutic paracentesis yielding 14.4 liters of peritoneal fluid. Patient will be evaluated by IR service for possible future TIPS. Read by: Rowe Robert, PA-C Electronically Signed   By: Sandi Mariscal M.D.   On: 11/21/2016 12:44   US Paracentesis  Result Date: 11/11/2016 INDICATION: Patient with history of chronic ITP, cirrhosis, recurrent ascites. Request made for therapeutic paracentesis up to 10 liters. EXAM: ULTRASOUND GUIDED THERAPEUTIC PARACENTESIS MEDICATIONS: None. COMPLICATIONS: None immediate. PROCEDURE: Informed written consent was obtained from the patient after a discussion of the risks, benefits and alternatives to treatment. A timeout was performed prior to the initiation of the procedure. Initial ultrasound scanning demonstrates a large amount of ascites within the left lower abdominal quadrant. The left lower abdomen was prepped and draped in the usual sterile fashion. 1% lidocaine was used for local anesthesia. Following this, a Yueh catheter was introduced. An ultrasound image was saved for documentation purposes. The paracentesis was performed. The catheter was  removed and a dressing was applied. The patient tolerated the procedure well without immediate post procedural complication. FINDINGS: A total of approximately 8.3 liters of yellow fluid was removed. IMPRESSION: Successful ultrasound-guided therapeutic paracentesis yielding 8.3 liters of peritoneal fluid. The patient will receive IV albumin postprocedure. Read by: Rowe Robert, PA-C he will receive IV albumin infusion postprocedure. Electronically Signed   By: Corrie Mckusick D.O.   On: 11/11/2016 15:07    Labs:  CBC:  Recent Labs  11/20/16 0727 11/20/16 1533 11/21/16 0405 11/22/16 0816  WBC 3.4* 3.4* 3.9* 3.5*  HGB 9.9* 9.7* 9.9* 10.0*  HCT 29.4* 29.1* 29.5* 29.5*  PLT 50* 52* 50* 48*  COAGS:  Recent Labs  02/07/16 0704  08/16/16 1459 10/26/16 1646 11/19/16 2008 11/20/16 0727  INR 1.43  < > 1.35 1.37 1.48 1.50  APTT 30  --  35  --  34 36  < > = values in this interval not displayed.  BMP:  Recent Labs  11/19/16 1728 11/20/16 0727 11/21/16 0405 11/22/16 0816  NA 136 133* 133* 132*  K 4.0 3.7 3.7 3.9  CL 103 103 101 100*  CO2 25 27 25 27   GLUCOSE 105* 90 96 90  BUN 11 9 10 10   CALCIUM 8.6* 8.2* 7.9* 8.2*  CREATININE 1.12 0.96 1.04 1.01  GFRNONAA >60 >60 >60 >60  GFRAA >60 >60 >60 >60    LIVER FUNCTION TESTS:  Recent Labs  11/09/16 1757 11/19/16 2008 11/20/16 1320 11/21/16 0405 11/22/16 0816  BILITOT 1.8* 2.9*  --  1.4* 1.4*  AST 81* 70*  --  57* 53*  ALT 52 46  --  40 35  ALKPHOS 104 90  --  84 80  PROT 6.9 6.6  --  6.1* 6.4*  ALBUMIN 2.6* 2.5* 2.4* 2.2* 2.9*    TUMOR MARKERS: No results for input(s): AFPTM, CEA, CA199, CHROMGRNA in the last 8760 hours.  Assessment and Plan: Patient with history of cirrhosis, esophageal varices requiring band ligation 06/22/16 and 08/17/16, and  BRTO for gastric varices now requiring recurrent large volume paracentesis.  Discussed TIPS procedure with patient and wife.  Described TIPS procedures and goal re:  symptom relief, particularly from his recurrent large ascites.  Patient and wife are interested in pursuing work-up for possible procedure and well as continuing to have ongoing discussions about patient's options for liver care given events over recent months.  Patient is currently admitted to [facility] with abdominal pain. Current management includes rule out SBP. Discussed case with Dr. Watt Climes who states patient will need EGD with possible banding prior to discharge.   An echocardiogram has been ordered for patient prior to discharge.  Once barriers to d/c resolved, including possible SBP, will have patient follow-up with Dr. Pascal Lux in [place] outpatient clinic to further discuss TIPS.  Patient will also be scheduled for weekly paracentesis to help with ascites management.   Thank you for this interesting consult.  I greatly enjoyed meeting TAJI HRNCIR and look forward to participating in their care.  A copy of this report was sent to the requesting provider on this date.  Electronically Signed: Docia Barrier 11/22/2016, 4:52 PM   I spent a total of    40 Minutes in face to face in clinical consultation, greater than 50% of which was counseling/coordinating care for recurrent ascites

## 2016-11-22 NOTE — Progress Notes (Signed)
Notified MD oncall that pt states he wants to be a partial code: pt wants all measures but no intubation. Will continue to monitor pt. Ranelle Oyster, RN

## 2016-11-22 NOTE — Progress Notes (Signed)
Vincent Ochoa 1:27 PM  Subjective: Patient doing much better status post paracentesis and we again discussed his liver and I discussed his case with IR and we discussed possible TIPS placement and the risks and possible benefits were discussed and his hospital computer chart was reviewed and his case discussed with my partner Dr. Amedeo Plenty Objective: Vital signs stable afebrile no acute distress morbid obesity nontender difficult to say any ascites no pedal edema labs stable fluid okay  Assessment: Cirrhosis complicated with varices and ascites  Plan: We had planned to proceed with an endoscopy and possible banding prior to discharge however if TIPS is going to be performed this hospital stay we could hold off and proceed with an endoscopy in a month or 2 per their tips protocol and I will be on standby to hear back once IR and the patient make a decision  [facility] E  Pager [phone] After 5PM or if no answer call [phone]

## 2016-11-22 NOTE — Progress Notes (Signed)
Family Medicine Teaching Service Daily Progress Note Intern Pager: [phone]  Patient name: SHAHRUKH HORROCKS Medical record number: [id number] Date of birth: 1956/11/04 Age: 61 y.o. Gender: male  Primary Care Provider: Ralene Ok, MD Consultants: IR, GI Code Status: DNR   Pt Overview and Major Events to Date:  12/30 - admitted to FPTS  Assessment and Plan: RISTON SCHULD is a 61 y.o. male presenting with hematuria and abdominal pain . PMH is significant for SBP, alcoholic cirrhosis of liver with ascites, CAD with NSTEMI, psoriasis, thrombocytopenia, OSA, obesity, CHF, GERD, depression, and PAF.   #Abdominal pain 2/2 cirrhosis of liver with ascites: s/p 14.4L by US-guided para. Fluid analysis without SBP.  Fluid culture pending. S/p 25 gm albumin x4. MELD-Na 17 with 6% mortality in 3 month.  -GI on board. EGD prior to discharge.  -In discussion with IR about TIPS -Will continue Rocephin 2g Q24h -AM CBC, BMET -Tramadol 50 mg po Q6h for pain control -Continue Lasix 40 mg daily -continue Spironolactone 100 mg oral daily -Zofran 4 mg po Q6 prn -Vitals per unit routine   #Hematuria.  UA +for gross hematuria, neg nitrite and trace leuks . Hx of kidney stones in past.  CT A/P with no signs of perforated viscus but with non-obstruction kidney stone noted.  With no signs of infection on UA, less concern for UTI.  Coagulopathy could also possibly be contributing. Hgb stable at 11.0 at this time.  -Continue to monitor -Will likely need urology outpatient f/u on discharge  #PAF.  On problem list.  Not on pharmacologic treatment currently not in A.fib  -Continue to monitor  #GERD.  Stable.  At home on Protonix 40 mg po BID and sucralfate 1g PO TID.  -Continue home regimen  #CHF.  Stable.  Echo 10/2014 with EF 60-65%.  At home on Lasix and Spironolactone.  -Continue Lasix 40 mg daily -continue Spironolactone 100 mg oral daily  #CAD.  S/p Left heart cath July 2016 showing nonobstructive  coronary artery disease.  Mid cx lesion previously treated with a bare metal stent in 10/2014.  Follows with Dr. Burt Knack outpatient.  -Continue Coreg 3.125 mg po BID -continue Lipitor 40 mg po daily -continue Imdur 30 mg po daily  #Pancytopenia: stable. likely sequestration due to liver disease. RDW normal making production issue unlikely. Plt at baseline ~50.  At home on Promacta 100 mg po daily.   -Continue home regimen  -Monitor  #OSA.  Stable.  Uses CPAP at home.  -CPAP ordered  #Depression.  Stable.  At home on Wellbutrin.   -Wellbutrin 150 mg po daily  #FEN/GI:  -Heart healthy  #Prophylaxis: Protonix, SCD, SLIV  #Disposition: likely home pending clinical improvement and EGD and possible TIPS  Subjective:  Sitting up on the bed enjoying his breakfast. Abdominal pain improved. He has no complaint today. Denies leak or drainage from tap site  Objective: Temp:  [98.2 F (36.8 C)-99.6 F (37.6 C)] 98.2 F (36.8 C) (01/02 0546) Pulse Rate:  [71-78] 72 (01/02 0546) Resp:  [16-18] 18 (01/02 0546) BP: (94-123)/(42-81) 110/57 (01/02 0546) SpO2:  [94 %-99 %] 95 % (01/02 0546) Physical Exam: General: sitting on the edge of the bed eating his breakfast Cardiovascular: RRR, no murmurs appreciated Respiratory: CTAB, normal WOB on RA, no whezes Abdomen: soft, +BS, no tenderness to palpation, no rebound, para site clean Extremities: moving all spontaneously, no LE edema Skin: some scatter spider angioma's Neuro: A&Ox3, no focal deficits Psych: appropriate mood and affect  Laboratory:  Recent Labs Lab 11/20/16 0727 11/20/16 1533 11/21/16 0405  WBC 3.4* 3.4* 3.9*  HGB 9.9* 9.7* 9.9*  HCT 29.4* 29.1* 29.5*  PLT 50* 52* 50*    Recent Labs Lab 11/19/16 1728 11/19/16 2008 11/20/16 0727 11/21/16 0405  NA 136  --  133* 133*  K 4.0  --  3.7 3.7  CL 103  --  103 101  CO2 25  --  27 25  BUN 11  --  9 10  CREATININE 1.12  --  0.96 1.04  CALCIUM 8.6*  --  8.2* 7.9*   PROT  --  6.6  --  6.1*  BILITOT  --  2.9*  --  1.4*  ALKPHOS  --  90  --  84  ALT  --  46  --  40  AST  --  70*  --  57*  GLUCOSE 105*  --  90 96    Imaging/Diagnostic Tests: Dg Chest 2 View  Result Date: 11/19/2016 CLINICAL DATA:  Patient with right lower quadrant pain for 2 days. Shortness of breath. EXAM: CHEST  2 VIEW COMPARISON:  Chest radiograph 10/31/2016. FINDINGS: Stable cardiac and mediastinal contours. No consolidative pulmonary opacities. No pleural effusion or pneumothorax. Regional skeleton is unremarkable. IMPRESSION: No active cardiopulmonary disease. Electronically Signed   By: Lovey Newcomer M.D.   On: 11/19/2016 18:22   Ct Abdomen Pelvis W Contrast  Result Date: 11/19/2016 CLINICAL DATA:  Worsening of abdominal swelling. Cirrhosis and ascites. Sudden onset of right lower quadrant pain. EXAM: CT ABDOMEN AND PELVIS WITH CONTRAST TECHNIQUE: Multidetector CT imaging of the abdomen and pelvis was performed using the standard protocol following bolus administration of intravenous contrast. CONTRAST:  138mL ISOVUE-300 IOPAMIDOL (ISOVUE-300) INJECTION 61% COMPARISON:  09/13/2016 FINDINGS: Lower chest: Normal Hepatobiliary: Advanced cirrhosis of the liver. Small liver with nodular surface and relative prominence of the left lobe an uncinate process. No focal lesions seen. Previous cholecystectomy. Pancreas: Normal Spleen: Splenomegaly consistent with portal venous hypertension. Adrenals/Urinary Tract: No adrenal mass. Renal parenchyma is normal. 7 mm stone lower pole right kidney. 4 mm stone midportion right kidney. Stomach/Bowel: No acute bowel finding. Vascular/Lymphatic: Aortic atherosclerosis. Infrarenal abdominal aortic aneurysm maximal transverse diameter 3.1 cm. IVC is normal. Extensive upper abdominal varices. Reproductive: Negative Other: Very large amount of ascites, freely distributed. Markedly increased since 2 months ago. Musculoskeletal: No acute finding. Ordinary spinal  degenerative changes. IMPRESSION: Massive increase in ascites, freely distributed. No other change. Cirrhosis, splenomegaly and upper abdominal varices. Nonobstructing renal calculi on the right. 3.1 cm infrarenal abdominal aortic aneurysm. Electronically Signed   By: Nelson Chimes M.D.   On: 11/19/2016 21:41   US Paracentesis  Result Date: 11/21/2016 INDICATION: Cirrhosis, CHF, recurrent ascites, prior BRTO. Request made for diagnostic and therapeutic paracentesis. EXAM: ULTRASOUND GUIDED DIAGNOSTIC AND THERAPEUTIC PARACENTESIS MEDICATIONS: None. COMPLICATIONS: None immediate. PROCEDURE: Informed written consent was obtained from the patient after a discussion of the risks, benefits and alternatives to treatment. A timeout was performed prior to the initiation of the procedure. Initial ultrasound scanning demonstrates a large amount of ascites within the right mid to lower abdominal quadrant. The right mid to lower abdomen was prepped and draped in the usual sterile fashion. 1% lidocaine was used for local anesthesia. Following this, a Yueh catheter was introduced. An ultrasound image was saved for documentation purposes. The paracentesis was performed. The catheter was removed and a dressing was applied. The patient tolerated the procedure well without immediate post procedural complication. FINDINGS:  A total of approximately 14.4 liters of light yellow fluid was removed. Samples were sent to the laboratory as requested by the clinical team. IMPRESSION: Successful ultrasound-guided diagnostic and therapeutic paracentesis yielding 14.4 liters of peritoneal fluid. Patient will be evaluated by IR service for possible future TIPS. Read by: Rowe Robert, PA-C Electronically Signed   By: Sandi Mariscal M.D.   On: 11/21/2016 12:44    Mercy Riding, MD 11/22/2016, 8:31 AM PGY-2, [place] Intern pager: [phone], text pages welcome

## 2016-11-23 ENCOUNTER — Inpatient Hospital Stay (HOSPITAL_COMMUNITY): Payer: Medicaid Other

## 2016-11-23 ENCOUNTER — Ambulatory Visit: Payer: Medicaid Other | Admitting: Internal Medicine

## 2016-11-23 ENCOUNTER — Other Ambulatory Visit: Payer: Medicaid Other

## 2016-11-23 DIAGNOSIS — K7460 Unspecified cirrhosis of liver: Secondary | ICD-10-CM

## 2016-11-23 DIAGNOSIS — I2510 Atherosclerotic heart disease of native coronary artery without angina pectoris: Secondary | ICD-10-CM

## 2016-11-23 LAB — CBC
HEMATOCRIT: 28.4 % — AB (ref 39.0–52.0)
HEMOGLOBIN: 9.7 g/dL — AB (ref 13.0–17.0)
MCH: 30.7 pg (ref 26.0–34.0)
MCHC: 34.2 g/dL (ref 30.0–36.0)
MCV: 89.9 fL (ref 78.0–100.0)
Platelets: 48 10*3/uL — ABNORMAL LOW (ref 150–400)
RBC: 3.16 MIL/uL — AB (ref 4.22–5.81)
RDW: 15.4 % (ref 11.5–15.5)
WBC: 2.8 10*3/uL — ABNORMAL LOW (ref 4.0–10.5)

## 2016-11-23 LAB — BASIC METABOLIC PANEL
ANION GAP: 5 (ref 5–15)
BUN: 10 mg/dL (ref 6–20)
CHLORIDE: 104 mmol/L (ref 101–111)
CO2: 28 mmol/L (ref 22–32)
Calcium: 8.3 mg/dL — ABNORMAL LOW (ref 8.9–10.3)
Creatinine, Ser: 1 mg/dL (ref 0.61–1.24)
GFR calc Af Amer: >60 mL/min (ref >60)
GFR calc non Af Amer: >60 mL/min (ref >60)
GLUCOSE: 98 mg/dL (ref 65–99)
Potassium: 3.9 mmol/L (ref 3.5–5.1)
Sodium: 137 mmol/L (ref 135–145)

## 2016-11-23 LAB — NA AND K (SODIUM & POTASSIUM), RAND UR
POTASSIUM UR: 28 mmol/L
SODIUM UR: 72 mmol/L

## 2016-11-23 LAB — ECHOCARDIOGRAM COMPLETE
HEIGHTINCHES: 75 in
Weight: 4779.2 oz

## 2016-11-23 LAB — MISC LABCORP TEST (SEND OUT): LABCORP TEST CODE: 88062

## 2016-11-23 MED ORDER — ELTROMBOPAG OLAMINE 25 MG PO TABS
50.0000 mg | ORAL_TABLET | Freq: Every day | ORAL | Status: DC
  Administered 2016-11-24 – 2016-11-25 (×2): 50 mg via ORAL

## 2016-11-23 MED ORDER — ELTROMBOPAG OLAMINE 25 MG PO TABS
25.0000 mg | ORAL_TABLET | Freq: Once | ORAL | Status: AC
  Administered 2016-11-23: 25 mg via ORAL
  Filled 2016-11-23: qty 1

## 2016-11-23 NOTE — Progress Notes (Signed)
Vincent Ochoa 4:56 PM  Subjective: Patient complains of blood in his urine and some increased fluid but no other complaints and we again discussed the timing of his possible TIPS and again the success rate and risks and discussed his outpatient diet and he has no new complaints  Objective: Vital signs stable afebrile abdomen is soft obese nontender no pedal edema labs stable urine lites okay  Assessment: Cirrhosis currently complicated with varices and difficult to manage ascites  Plan: The risk of endoscopy and probable banding was rediscussed and then if okay can probably go home on low sodium diet and current diuretic dose and I will see him back in the office in 2 weeks and we will to the hospitalist to schedule a weekly paracentesis until IR follow-up with Dr. Pascal Lux   [facility]  Pager [phone] After 5PM or if no answer call [phone]

## 2016-11-23 NOTE — Telephone Encounter (Signed)
Patient was admitted, I will be sure to follow up on discharge medication list.

## 2016-11-23 NOTE — Progress Notes (Signed)
Family Medicine Teaching Service Daily Progress Note Intern Pager: [phone]  Patient name: Vincent Ochoa Medical record number: [id number] Date of birth: 1956-09-25 Age: 62 y.o. Gender: male  Primary Care Provider: Ralene Ok, MD Consultants: IR, GI Code Status: DNR   Pt Overview and Major Events to Date:  12/30-admitted to FPTS  Assessment and Plan: Vincent Ochoa is a 61 y.o. male presenting with hematuria and abdominal pain . PMH is significant for SBP, alcoholic cirrhosis of liver with ascites, CAD with NSTEMI, psoriasis, thrombocytopenia, OSA, obesity, CHF, GERD, depression, and PAF.   #Abdominal pain 2/2 cirrhosis of liver with ascites S/p 14.4L by US-guided paracentesis s/p 25 gm albumin x4. MELD-Na 17 with 6% mortality in 3 month. Fluid analysis without SBP and cultures have shown no growth to date. --Patient is scheduled EGD with possible banding  --IR discuss TIPS and will follow up with patient in the outpatient settings --Discontinue Rocephin 2g Q24h, Flagyl 500 mg q6 --Tramadol 50 mg po Q6h for pain control --Continue Lasix 40 mg daily --Continue Spironolactone 100 mg oral daily --Zofran 4 mg po Q6 prn --Patient would like to would like to establish care with [place] --Patient would also like to transfer care to a different GI physician   #Hematuria.  UA +for gross hematuria, neg nitrite and trace leuks . Hx of kidney stones in past.  CT A/P with no signs of perforated viscus but with non-obstruction kidney stone noted.  With no signs of infection on UA, less concern for UTI.  Coagulopathy could also possibly be contributing. Hgb stable at 11.0 at this time.  -Continue to monitor -Will refer to [facility]  #PAF.  On problem list.  Not on pharmacologic treatment currently not in A.fib  -Continue to monitor  #GERD.  Stable.  At home on Protonix 40 mg po BID and sucralfate 1g PO TID.  -Continue home regimen  #CHF.  Stable.  Echo 10/2014  with EF 60-65%.  At home on Lasix and Spironolactone.  -Continue Lasix 40 mg daily -continue Spironolactone 100 mg oral daily  #CAD.  S/p Left heart cath July 2016 showing nonobstructive coronary artery disease.  Mid cx lesion previously treated with a bare metal stent in 10/2014.  Follows with Dr. Burt Knack outpatient.  --Continue Coreg 3.125 mg po BID --Continue Lipitor 40 mg po daily --Continue Imdur 30 mg po daily  #Pancytopenia: stable. likely sequestration due to liver disease. RDW normal making production issue unlikely. Plt at baseline ~50.  At home on Promacta 100 mg po daily.   -Continue home regimen  -Monitor  #OSA.  Stable.  Uses CPAP at home.  -CPAP ordered  #Depression.  Stable.  At home on Wellbutrin.   -Wellbutrin 150 mg po daily  #FEN/GI:  -Heart healthy  #Prophylaxis: Protonix, SCD, SLIV  #Disposition: likely home pending clinical improvement and EGD and possible TIPS  Subjective:  Patient is doing well this morning, denies any abdominal pain, SOB and is tolerating po.   Objective: Temp:  [97.7 F (36.5 C)-98.2 F (36.8 C)] 98 F (36.7 C) (01/03 1412) Pulse Rate:  [74-90] 90 (01/03 1412) Resp:  [18-21] 21 (01/03 1412) BP: (94-111)/(48-65) 106/55 (01/03 1412) SpO2:  [96 %-100 %] 100 % (01/03 1412)   Physical Exam: General: sitting on the edge of the bed eating his breakfast Cardiovascular: RRR, no murmurs appreciated Respiratory: CTAB, normal WOB on RA, no whezes Abdomen: soft, +BS, no tenderness to palpation, no rebound, para site clean Extremities: moving all  spontaneously, no LE edema Skin: some scatter spider angioma's Neuro: A&Ox3, no focal deficits Psych: appropriate mood and affect   Laboratory:  Recent Labs Lab 11/21/16 0405 11/22/16 0816 11/23/16 0759  WBC 3.9* 3.5* 2.8*  HGB 9.9* 10.0* 9.7*  HCT 29.5* 29.5* 28.4*  PLT 50* 48* 48*    Recent Labs Lab 11/19/16 2008  11/21/16 0405 11/22/16 0816 11/23/16 0759  NA  --   < >  133* 132* 137  K  --   < > 3.7 3.9 3.9  CL  --   < > 101 100* 104  CO2  --   < > 25 27 28   BUN  --   < > 10 10 10   CREATININE  --   < > 1.04 1.01 1.00  CALCIUM  --   < > 7.9* 8.2* 8.3*  PROT 6.6  --  6.1* 6.4*  --   BILITOT 2.9*  --  1.4* 1.4*  --   ALKPHOS 90  --  84 80  --   ALT 46  --  40 35  --   AST 70*  --  57* 53*  --   GLUCOSE  --   < > 96 90 98  < > = values in this interval not displayed.  Imaging/Diagnostic Tests: Dg Chest 2 View  Result Date: 11/19/2016 CLINICAL DATA:  Patient with right lower quadrant pain for 2 days. Shortness of breath. EXAM: CHEST  2 VIEW COMPARISON:  Chest radiograph 10/31/2016. FINDINGS: Stable cardiac and mediastinal contours. No consolidative pulmonary opacities. No pleural effusion or pneumothorax. Regional skeleton is unremarkable. IMPRESSION: No active cardiopulmonary disease. Electronically Signed   By: Lovey Newcomer M.D.   On: 11/19/2016 18:22   Ct Abdomen Pelvis W Contrast  Result Date: 11/19/2016 CLINICAL DATA:  Worsening of abdominal swelling. Cirrhosis and ascites. Sudden onset of right lower quadrant pain. EXAM: CT ABDOMEN AND PELVIS WITH CONTRAST TECHNIQUE: Multidetector CT imaging of the abdomen and pelvis was performed using the standard protocol following bolus administration of intravenous contrast. CONTRAST:  140mL ISOVUE-300 IOPAMIDOL (ISOVUE-300) INJECTION 61% COMPARISON:  09/13/2016 FINDINGS: Lower chest: Normal Hepatobiliary: Advanced cirrhosis of the liver. Small liver with nodular surface and relative prominence of the left lobe an uncinate process. No focal lesions seen. Previous cholecystectomy. Pancreas: Normal Spleen: Splenomegaly consistent with portal venous hypertension. Adrenals/Urinary Tract: No adrenal mass. Renal parenchyma is normal. 7 mm stone lower pole right kidney. 4 mm stone midportion right kidney. Stomach/Bowel: No acute bowel finding. Vascular/Lymphatic: Aortic atherosclerosis. Infrarenal abdominal aortic aneurysm  maximal transverse diameter 3.1 cm. IVC is normal. Extensive upper abdominal varices. Reproductive: Negative Other: Very large amount of ascites, freely distributed. Markedly increased since 2 months ago. Musculoskeletal: No acute finding. Ordinary spinal degenerative changes. IMPRESSION: Massive increase in ascites, freely distributed. No other change. Cirrhosis, splenomegaly and upper abdominal varices. Nonobstructing renal calculi on the right. 3.1 cm infrarenal abdominal aortic aneurysm. Electronically Signed   By: Nelson Chimes M.D.   On: 11/19/2016 21:41   US Paracentesis  Result Date: 11/21/2016 INDICATION: Cirrhosis, CHF, recurrent ascites, prior BRTO. Request made for diagnostic and therapeutic paracentesis. EXAM: ULTRASOUND GUIDED DIAGNOSTIC AND THERAPEUTIC PARACENTESIS MEDICATIONS: None. COMPLICATIONS: None immediate. PROCEDURE: Informed written consent was obtained from the patient after a discussion of the risks, benefits and alternatives to treatment. A timeout was performed prior to the initiation of the procedure. Initial ultrasound scanning demonstrates a large amount of ascites within the right mid to lower abdominal quadrant. The right mid  to lower abdomen was prepped and draped in the usual sterile fashion. 1% lidocaine was used for local anesthesia. Following this, a Yueh catheter was introduced. An ultrasound image was saved for documentation purposes. The paracentesis was performed. The catheter was removed and a dressing was applied. The patient tolerated the procedure well without immediate post procedural complication. FINDINGS: A total of approximately 14.4 liters of light yellow fluid was removed. Samples were sent to the laboratory as requested by the clinical team. IMPRESSION: Successful ultrasound-guided diagnostic and therapeutic paracentesis yielding 14.4 liters of peritoneal fluid. Patient will be evaluated by IR service for possible future TIPS. Read by: Rowe Robert, PA-C  Electronically Signed   By: Sandi Mariscal M.D.   On: 11/21/2016 12:44    Marjie Skiff, MD 11/23/2016, 3:22 PM PGY-1, [place] Intern pager: [phone], text pages welcome

## 2016-11-23 NOTE — Progress Notes (Signed)
Patient ID: Vincent Ochoa, male   DOB: 13-Jun-1956, 61 y.o.   MRN: [id number]  Touching base with pt and wife today Wife via phone  Possible TIPs procedure in IR soon.  Awaiting echo  And all studies Will discuss again with pt and Dr Watt Climes Will need formal consultation in [facility] with Dr Pascal Lux  Order in for consultation Pt will hear from scheduler for time and date.  MELD 12 today. (Using INR from 12/31)  All questions answered to satisfaction

## 2016-11-24 ENCOUNTER — Encounter (HOSPITAL_COMMUNITY)
Admission: EM | Disposition: A | Discharge status: Home / Self Care | Payer: Self-pay | Source: Home / Self Care | Attending: Family Medicine

## 2016-11-24 ENCOUNTER — Encounter (HOSPITAL_COMMUNITY): Payer: Self-pay | Admitting: *Deleted

## 2016-11-24 ENCOUNTER — Inpatient Hospital Stay (HOSPITAL_COMMUNITY): Payer: Medicaid Other | Admitting: Anesthesiology

## 2016-11-24 HISTORY — PX: ESOPHAGOGASTRODUODENOSCOPY: SHX5428

## 2016-11-24 LAB — CBC
HCT: 33.2 % — ABNORMAL LOW (ref 39.0–52.0)
Hemoglobin: 11.3 g/dL — ABNORMAL LOW (ref 13.0–17.0)
MCH: 30.5 pg (ref 26.0–34.0)
MCHC: 34 g/dL (ref 30.0–36.0)
MCV: 89.5 fL (ref 78.0–100.0)
Platelets: 61 10*3/uL — ABNORMAL LOW (ref 150–400)
RBC: 3.71 MIL/uL — AB (ref 4.22–5.81)
RDW: 15.5 % (ref 11.5–15.5)
WBC: 3.6 10*3/uL — AB (ref 4.0–10.5)

## 2016-11-24 LAB — BASIC METABOLIC PANEL
ANION GAP: 6 (ref 5–15)
BUN: 12 mg/dL (ref 6–20)
CO2: 26 mmol/L (ref 22–32)
Calcium: 8.7 mg/dL — ABNORMAL LOW (ref 8.9–10.3)
Chloride: 104 mmol/L (ref 101–111)
Creatinine, Ser: 1.06 mg/dL (ref 0.61–1.24)
GFR calc Af Amer: >60 mL/min (ref >60)
GFR calc non Af Amer: >60 mL/min (ref >60)
GLUCOSE: 95 mg/dL (ref 65–99)
POTASSIUM: 4.1 mmol/L (ref 3.5–5.1)
Sodium: 136 mmol/L (ref 135–145)

## 2016-11-24 SURGERY — EGD (ESOPHAGOGASTRODUODENOSCOPY)
Anesthesia: Monitor Anesthesia Care

## 2016-11-24 MED ORDER — FENTANYL CITRATE (PF) 100 MCG/2ML IJ SOLN
INTRAMUSCULAR | Status: DC | PRN
  Administered 2016-11-24 (×2): 50 ug via INTRAVENOUS

## 2016-11-24 MED ORDER — PROPOFOL 10 MG/ML IV BOLUS
INTRAVENOUS | Status: DC | PRN
  Administered 2016-11-24 (×3): 10 mg via INTRAVENOUS

## 2016-11-24 MED ORDER — LACTATED RINGERS IV SOLN
INTRAVENOUS | Status: DC
  Administered 2016-11-24 (×2): via INTRAVENOUS

## 2016-11-24 MED ORDER — SODIUM CHLORIDE 0.9 % IV SOLN: INTRAVENOUS | Status: DC

## 2016-11-24 MED ORDER — LIDOCAINE HCL (CARDIAC) 20 MG/ML IV SOLN
INTRAVENOUS | Status: DC | PRN
  Administered 2016-11-24: 40 mg via INTRATRACHEAL

## 2016-11-24 MED ORDER — MIDAZOLAM HCL 5 MG/5ML IJ SOLN
INTRAMUSCULAR | Status: DC | PRN
  Administered 2016-11-24 (×2): 1 mg via INTRAVENOUS

## 2016-11-24 MED ORDER — ONDANSETRON HCL 4 MG/2ML IJ SOLN
INTRAMUSCULAR | Status: DC | PRN
  Administered 2016-11-24: 4 mg via INTRAVENOUS

## 2016-11-24 NOTE — Progress Notes (Signed)
Family Medicine Teaching Service Daily Progress Note Intern Pager: [phone]  Patient name: Vincent Ochoa Medical record number: [id number] Date of birth: [birth year] Age: 61 y.o. Gender: male  Primary Care Provider: Ralene Ok, MD Consultants: IR, GI Code Status: DNR   Pt Overview and Major Events to Date:  12/30-admitted to FPTS  Assessment and Plan: Vincent Ochoa is a 61 y.o. male presenting with hematuria and abdominal pain . PMH is significant for SBP, alcoholic cirrhosis of liver with ascites, CAD with NSTEMI, psoriasis, thrombocytopenia, OSA, obesity, CHF, GERD, depression, and PAF.   #Abdominal pain 2/2 cirrhosis of liver with ascites S/p 14.4L by US-guided paracentesis s/p 25 gm albumin x4. MELD-Na 17 with 6% mortality in 3 month. Fluid analysis  without SBP and cultures have shown no growth to date. --Patient will undergo EGD with possible banding today --IR discussed TIPS with patient and will follow up with patient in the outpatient settings --Patient will also be scheduled for weekly paracentesis with the hospitalist service --D/ced Rocephin 2g Q24h, Flagyl 500 mg q6 (stopped 1/3) --Tramadol 50 mg po Q6h for pain control --Continue Lasix 40 mg daily  --Continue Spironolactone 100 mg oral daily --Zofran 4 mg po Q6 prn --Patient would also like to transfer care to a different GI physician  #Hematuria UA +for gross hematuria, neg nitrite and trace leuks . Hx of kidney stones in past.  CT A/P with no signs of perforated viscus but with non-obstruction kidney stone noted.  With no signs of infection on UA, less concern for UTI.  Coagulopathy could also possibly be contributing. Hgb stable at 11.3 at this time.  -Continue to monitor -Will refer to [facility]  #PAF  On problem list.  Not on pharmacologic treatment currently not in A.fib  --Continue to monitor  #GERD, stable.   At home on Protonix 40 mg po BID and sucralfate 1g PO TID.  --Continue home  regimen  #CHF Stable.  Echo 10/2014 with EF 60-65%.  At home on Lasix and Spironolactone.  --Continue Lasix 40 mg daily --Continue Spironolactone 100 mg oral daily  #CAD S/p Left heart cath July 2016 showing nonobstructive coronary artery disease.  Mid cx lesion previously treated with a bare metal stent in 10/2014.  Follows with Dr. Burt Knack outpatient.  --Continue Coreg 3.125 mg po BID --Continue Lipitor 40 mg po daily --Continue Imdur 30 mg po daily  #Pancytopenia, stable  likely sequestration due to liver disease. RDW normal making production issue unlikely. Plt at baseline ~50.  At home on Promacta 100 mg po daily.   --Continue home regimen  --Monitor  #OSA, Stable Uses CPAP at home.  --CPAP ordered  #Depression, stable  Will continue home regimen   --Wellbutrin 150 mg po daily  FEN/GI: Heart healthy Prophylaxis: Protonix, SCD, SLIV   #Disposition: likely home pending clinical improvement and EGD and possible TIPS  Subjective:  Patient had already left for EGD this morning. There were no acute events overnight. Patient was stable.  Objective: Temp:  [97.7 F (36.5 C)-98.5 F (36.9 C)] 98.3 F (36.8 C) (01/04 0509) Pulse Rate:  [68-90] 82 (01/04 0509) Resp:  [18-21] 18 (01/04 0509) BP: (97-120)/(55-65) 97/58 (01/04 0509) SpO2:  [97 %-100 %] 97 % (01/04 0509)   Physical Exam: General: sitting on the edge of the bed eating his breakfast Cardiovascular: RRR, no murmurs appreciated Respiratory: CTAB, normal WOB on RA, no whezes Abdomen: soft, +BS, no tenderness to palpation, no rebound, para site clean Extremities: moving all spontaneously,  no LE edema Skin: some scatter spider angioma's Neuro: A&Ox3, no focal deficits Psych: appropriate mood and affect   Laboratory:  Recent Labs Lab 11/21/16 0405 11/22/16 0816 11/23/16 0759  WBC 3.9* 3.5* 2.8*  HGB 9.9* 10.0* 9.7*  HCT 29.5* 29.5* 28.4*  PLT 50* 48* 48*    Recent Labs Lab 11/19/16 2008   11/21/16 0405 11/22/16 0816 11/23/16 0759  NA  --   < > 133* 132* 137  K  --   < > 3.7 3.9 3.9  CL  --   < > 101 100* 104  CO2  --   < > 25 27 28   BUN  --   < > 10 10 10   CREATININE  --   < > 1.04 1.01 1.00  CALCIUM  --   < > 7.9* 8.2* 8.3*  PROT 6.6  --  6.1* 6.4*  --   BILITOT 2.9*  --  1.4* 1.4*  --   ALKPHOS 90  --  84 80  --   ALT 46  --  40 35  --   AST 70*  --  57* 53*  --   GLUCOSE  --   < > 96 90 98  < > = values in this interval not displayed.  Imaging/Diagnostic Tests: US Paracentesis  Result Date: 11/21/2016 INDICATION: Cirrhosis, CHF, recurrent ascites, prior BRTO. Request made for diagnostic and therapeutic paracentesis. EXAM: ULTRASOUND GUIDED DIAGNOSTIC AND THERAPEUTIC PARACENTESIS MEDICATIONS: None. COMPLICATIONS: None immediate. PROCEDURE: Informed written consent was obtained from the patient after a discussion of the risks, benefits and alternatives to treatment. A timeout was performed prior to the initiation of the procedure. Initial ultrasound scanning demonstrates a large amount of ascites within the right mid to lower abdominal quadrant. The right mid to lower abdomen was prepped and draped in the usual sterile fashion. 1% lidocaine was used for local anesthesia. Following this, a Yueh catheter was introduced. An ultrasound image was saved for documentation purposes. The paracentesis was performed. The catheter was removed and a dressing was applied. The patient tolerated the procedure well without immediate post procedural complication. FINDINGS: A total of approximately 14.4 liters of light yellow fluid was removed. Samples were sent to the laboratory as requested by the clinical team. IMPRESSION: Successful ultrasound-guided diagnostic and therapeutic paracentesis yielding 14.4 liters of peritoneal fluid. Patient will be evaluated by IR service for possible future TIPS. Read by: Rowe Robert, PA-C Electronically Signed   By: Sandi Mariscal M.D.   On: 11/21/2016 12:44     Marjie Skiff, MD 11/24/2016, 7:11 AM PGY-1, [place] Intern pager: [phone], text pages welcome

## 2016-11-24 NOTE — Progress Notes (Signed)
Transitions of Care Pharmacy Note  Plan:  Educated on: - Dose changes of Promacta and spironolactone  Addressed concerns regarding: - Outpatient TIPS procedure and germ fear - Home medication (Promacta) and how to get back from the pharmacy  Follow-up: - Atorvastatin 40 mg daily - use not recommended in active liver disease, but patient has been on stable on this dose for a over 1 year. Will follow-up in outpatient.  - Wellbutrin XL 150 mg daily - every other day dosing recommended in moderate to severe hepatic impairment.Follow-up in outpatient.   - Promacta 50 mg daily - max dose is 75 mg total due to hepatic impairment. - Need for pantoprazole bid and length of therapy  --------------------------------------------- Vincent Ochoa is an 61 y.o. male who presents with a chief complaint abdominal pain and significant ascites. In anticipation of discharge, pharmacy has reviewed this patient's prior to admission medication history, as well as current inpatient medications listed per the [facility]  Current medication indications, dosing, frequency, and notable side effects reviewed with patient, who verbalized understanding of current inpatient medication regimen and is aware that the After Visit Summary when presented, will represent the most accurate medication list at discharge.   Vincent Ochoa expressed concerns regarding fear of germs and infection with outpatient TIPS.    Assessment: Understanding of regimen: good Understanding of indications: good Potential of compliance: good Barriers to Obtaining Medications: No  Patient instructed to contact inpatient pharmacy team with further questions or concerns if needed.    Time spent preparing for discharge counseling: 10 min Time spent counseling patient: 10 min   Thank you for allowing pharmacy to be a part of this patient's care.  Belia Heman, PharmD PGY1 Pharmacy Resident [phone] (Pager) 11/24/2016 5:24 PM

## 2016-11-24 NOTE — Progress Notes (Signed)
Vincent Ochoa 8:37 AM  Subjective: Patient doing fine without any new complaints since seen yesterday afternoon  Objective: Vital signs stable afebrile exam unchanged please see preassessment evaluation no new labs  Assessment: Cirrhosis with varices and ascites  Plan: Okay to proceed with endoscopy and possible banding with anesthesia assistance  [facility]  Pager [phone] After 5PM or if no answer call [phone]

## 2016-11-24 NOTE — Anesthesia Preprocedure Evaluation (Addendum)
Anesthesia Evaluation  Patient identified by MRN, date of birth, ID band Patient awake    Reviewed: Allergy & Precautions, NPO status , Patient's Chart, lab work & pertinent test results  Airway Mallampati: I  TM Distance: >3 FB Neck ROM: Full    Dental  (+) Poor Dentition, Missing, Edentulous Upper   Pulmonary shortness of breath, sleep apnea , Current Smoker, former smoker,    breath sounds clear to auscultation + decreased breath sounds      Cardiovascular hypertension, + CAD, + Past MI and + Cardiac Stents (BMS 2015, not on aspirin due to recurrent GI bleeds)  + dysrhythmias Atrial Fibrillation  Rhythm:Regular Rate:Normal     Neuro/Psych  Headaches, PSYCHIATRIC DISORDERS Depression CVA    GI/Hepatic GERD  ,(+) Cirrhosis   Esophageal Varices    , Fatty liver disease with varices   Endo/Other  negative endocrine ROS  Renal/GU negative Renal ROS     Musculoskeletal  (+) Arthritis ,   Abdominal (+) + obese,   Peds  Hematology  (+) anemia ,   Anesthesia Other Findings   Reproductive/Obstetrics                            Anesthesia Physical  Anesthesia Plan  ASA: IV  Anesthesia Plan: MAC   Post-op Pain Management:    Induction: Intravenous  Airway Management Planned: Nasal Cannula  Additional Equipment:   Intra-op Plan:   Post-operative Plan:   Informed Consent: I have reviewed the patients History and Physical, chart, labs and discussed the procedure including the risks, benefits and alternatives for the proposed anesthesia with the patient or authorized representative who has indicated his/her understanding and acceptance.   Dental advisory given  Plan Discussed with: CRNA, Anesthesiologist and Surgeon  Anesthesia Plan Comments:         Anesthesia Quick Evaluation

## 2016-11-24 NOTE — Anesthesia Postprocedure Evaluation (Signed)
Anesthesia Post Note  Patient: TIMM BONENBERGER  Procedure(s) Performed: Procedure(s) (LRB): ESOPHAGOGASTRODUODENOSCOPY (EGD) (N/A)  Patient location during evaluation: Endoscopy Anesthesia Type: MAC Level of consciousness: awake and alert Pain management: pain level controlled Vital Signs Assessment: post-procedure vital signs reviewed and stable Respiratory status: spontaneous breathing, nonlabored ventilation, respiratory function stable and patient connected to nasal cannula oxygen Cardiovascular status: stable and blood pressure returned to baseline Anesthetic complications: no       Last Vitals:  Vitals:   11/24/16 0910 11/24/16 0920  BP: (!) 147/84 124/85  Pulse: 82 80  Resp: 10 11  Temp:      Last Pain:  Vitals:   11/24/16 0748  TempSrc: Oral  PainSc:                  Shawnese Magner,JAMES TERRILL

## 2016-11-24 NOTE — Op Note (Signed)
[facility] Patient Name: Vincent Ochoa Procedure Date : 11/24/2016 MRN: [id number] Attending MD: Clarene Essex , MD Date of Birth: 03/27/1956 CSN: [id number] Age: 61 Admit Type: Inpatient Procedure:                Upper GI endoscopy Indications:              Follow-up of esophageal varices Providers:                Clarene Essex, MD, Carolynn Comment, RN, Alfonso Patten,                            Technician, Gertie Exon, CRNA Referring MD:              Medicines:                Fentanyl 100 micrograms IV, Midazolam 2 mg IV,                            Propofol total dose 30 mg IV and 40 mg IV lidocaine Complications:            No immediate complications. Estimated Blood Loss:     Estimated blood loss: none. Procedure:                Pre-Anesthesia Assessment:                           - Prior to the procedure, a History and Physical                            was performed, and patient medications and                            allergies were reviewed. The patient's tolerance of                            previous anesthesia was also reviewed. The risks                            and benefits of the procedure and the sedation                            options and risks were discussed with the patient.                            All questions were answered, and informed consent                            was obtained. Prior Anticoagulants: The patient has                            taken no previous anticoagulant or antiplatelet                            agents. ASA Grade Assessment: II - A patient with  mild systemic disease. After reviewing the risks                            and benefits, the patient was deemed in                            satisfactory condition to undergo the procedure.                           After obtaining informed consent, the endoscope was                            passed under direct vision. Throughout the            procedure, the patient's blood pressure, pulse, and                            oxygen saturations were monitored continuously. The                            [id number] scope was introduced through the                            mouth, and advanced to the second part of duodenum.                            The upper GI endoscopy was accomplished without                            difficulty. The patient tolerated the procedure                            well. Scope In: Scope Out: Findings:      Grade I varices were found in the lower third of the esophagus. Three       bands were successfully placed with no obvious complication. There was       no bleeding during the procedure.      Mild portal hypertensive gastropathy was found in the cardia and in the       gastric fundus and minimal proximal stomach.      A few small semi-sessile polyps with no bleeding were found in the       gastric antrum previously biopsied.      The duodenal bulb, first portion of the duodenum and second portion of       the duodenum were normal.      The exam was otherwise without abnormality. Impression:               - Grade I esophageal varices. Banded.                           - Portal hypertensive gastropathy.                           - A few gastric polyps.                           -  Normal duodenal bulb, first portion of the                            duodenum and second portion of the duodenum.                           - The examination was otherwise normal.                           - No specimens collected. Moderate Sedation:      moderate sedation-none Recommendation:           - Patient has a contact number available for                            emergencies. The signs and symptoms of potential                            delayed complications were discussed with the                            patient. Return to normal activities tomorrow.                            Written  discharge instructions were provided to the                            patient.                           - Full liquid diet for 4 hours.then soft solids for                            a few days and make sure he understands low sodium                            diet at home                           - Continue present medications.                           - Return to GI clinic in 2 weeks. lease set up a                            weekly outpatient large-volume paracentesis with IR                            as well as his appointment in one month with IR to                            discuss tips                           - Telephone GI clinic if symptomatic PRN. okay to  go home later today if no delayed complications or                            other medical issues Procedure Code(s):        --- Professional ---                           [phone], Esophagogastroduodenoscopy, flexible,                            transoral; with band ligation of esophageal/gastric                            varices Diagnosis Code(s):        --- Professional ---                           I85.00, Esophageal varices without bleeding                           K76.6, Portal hypertension                           K31.89, Other diseases of stomach and duodenum                           K31.7, Polyp of stomach and duodenum CPT copyright 2016 [employer] Medical Association. All rights reserved. The codes documented in this report are preliminary and upon coder review may  be revised to meet current compliance requirements. Clarene Essex, MD 11/24/2016 9:08:44 AM This report has been signed electronically. Number of Addenda: 0

## 2016-11-24 NOTE — Transfer of Care (Signed)
Immediate Anesthesia Transfer of Care Note  Patient: ERCOLE GEORG  Procedure(s) Performed: Procedure(s): ESOPHAGOGASTRODUODENOSCOPY (EGD) (N/A)  Patient Location: PACU  Anesthesia Type:MAC  Level of Consciousness: awake, alert , oriented and sedated  Airway & Oxygen Therapy: Patient Spontanous Breathing and Patient connected to nasal cannula oxygen  Post-op Assessment: Report given to RN, Post -op Vital signs reviewed and stable and Patient moving all extremities  Post vital signs: Reviewed and stable  Last Vitals:  Vitals:   11/24/16 0748 11/24/16 0908  BP: 110/63 (!) 147/84  Pulse: 72 83  Resp: 10 11  Temp: 36.9 C     Last Pain:  Vitals:   11/24/16 0748  TempSrc: Oral  PainSc:       Patients Stated Pain Goal: 0 (86/76/19 5093)  Complications: No apparent anesthesia complications

## 2016-11-25 ENCOUNTER — Encounter (HOSPITAL_COMMUNITY): Payer: Self-pay | Admitting: Gastroenterology

## 2016-11-25 ENCOUNTER — Inpatient Hospital Stay (HOSPITAL_COMMUNITY): Payer: Medicaid Other

## 2016-11-25 LAB — BASIC METABOLIC PANEL
Anion gap: 6 (ref 5–15)
BUN: 11 mg/dL (ref 6–20)
CALCIUM: 8.4 mg/dL — AB (ref 8.9–10.3)
CO2: 26 mmol/L (ref 22–32)
CREATININE: 1.04 mg/dL (ref 0.61–1.24)
Chloride: 103 mmol/L (ref 101–111)
GFR calc Af Amer: >60 mL/min (ref >60)
GFR calc non Af Amer: >60 mL/min (ref >60)
GLUCOSE: 87 mg/dL (ref 65–99)
Potassium: 4.2 mmol/L (ref 3.5–5.1)
Sodium: 135 mmol/L (ref 135–145)

## 2016-11-25 LAB — CBC
HEMATOCRIT: 30.5 % — AB (ref 39.0–52.0)
Hemoglobin: 10.4 g/dL — ABNORMAL LOW (ref 13.0–17.0)
MCH: 30.4 pg (ref 26.0–34.0)
MCHC: 34.1 g/dL (ref 30.0–36.0)
MCV: 89.2 fL (ref 78.0–100.0)
Platelets: 56 10*3/uL — ABNORMAL LOW (ref 150–400)
RBC: 3.42 MIL/uL — ABNORMAL LOW (ref 4.22–5.81)
RDW: 15.4 % (ref 11.5–15.5)
WBC: 3.6 10*3/uL — ABNORMAL LOW (ref 4.0–10.5)

## 2016-11-25 LAB — CULTURE, BLOOD (ROUTINE X 2)
CULTURE: NO GROWTH
Culture: NO GROWTH

## 2016-11-25 MED ORDER — BUPROPION HCL ER (XL) 150 MG PO TB24: 150.0000 mg | ORAL_TABLET | ORAL | 3 refills | Status: DC

## 2016-11-25 MED ORDER — SPIRONOLACTONE 100 MG PO TABS: 100.0000 mg | ORAL_TABLET | Freq: Every day | ORAL | 0 refills | Status: DC

## 2016-11-25 MED ORDER — CALCIUM CARBONATE ANTACID 500 MG PO CHEW
1.0000 | CHEWABLE_TABLET | Freq: Once | ORAL | Status: AC
  Administered 2016-11-25: 200 mg via ORAL
  Filled 2016-11-25: qty 1

## 2016-11-25 NOTE — Progress Notes (Signed)
Vincent Ochoa D/C'd  per MD order.  Discussed with the patient and family, all questions fully answered.  VSS, Skin clean, dry and intact without evidence of skin break down, no evidence of skin tears noted. IV catheter discontinued intact. Site without signs and symptoms of complications. Dressing and pressure applied.  An After Visit Summary was printed and given to the patient. Patient received prescription.  D/c education completed with patient/family including follow up instructions, medication list, d/c activities limitations if indicated, with other d/c instructions as indicated by MD - patient able to verbalize understanding, all questions fully answered.   Patient instructed to return to ED, call 911, or call MD for any changes in condition.   Patient escorted via [place], and D/C home via private auto.  Dorris Carnes 11/25/2016 7:01 PM

## 2016-11-25 NOTE — Progress Notes (Signed)
Family Medicine Teaching Service Daily Progress Note Intern Pager: [phone]  Patient name: Vincent Ochoa Medical record number: [id number] Date of birth: 1956/06/09 Age: 61 y.o. Gender: male  Primary Care Provider: Ralene Ok, MD Consultants: IR, GI Code Status: DNR   Assessment and Plan: Vincent Ochoa is a 61 y.o. male presenting with hematuria and abdominal pain . PMH is significant for SBP, alcoholic cirrhosis of liver with ascites, CAD with NSTEMI, psoriasis, thrombocytopenia, OSA, obesity, CHF, GERD, depression, and PAF.   #Abdominal pain 2/2 cirrhosis of liver with ascites S/p 14.4L by US-guided paracentesis s/p 25 gm albumin x4. MELD-Na 17 with 6% mortality in 3 month. Fluid analysis  without SBP and cultures have shown no growth to date. Patient had upper GI endoscopy yesterday and found to have grade 1 esophageal varices requiring banding. Few gastric polyps were also found and some evidence of portal hypertensive gastropathy. --US Abdomen today, patient might need paracentesis prior to discharge. --TIPS discussed with IR and will follow up with patient in the outpatient settings --Patient will also be scheduled for weekly paracentesis with the hospitalist service --Tramadol 50 mg po Q6h for pain control --Continue Lasix 40 mg daily  --Continue Spironolactone 100 mg oral daily --Zofran 4 mg po Q6 prn --Patient would also like to transfer care to a different GI physician  #Hematuria UA +for gross hematuria, neg nitrite and trace leuks . Hx of kidney stones in past.  CT A/P with no signs of perforated viscus but with non-obstruction kidney stone noted.  With no signs of infection on UA, less concern for UTI.  Coagulopathy could also possibly be contributing. Hgb stable at 11.3 at this time.  --Continue to monitor --Will refer to [facility]  #PAF  On problem list.  Not on pharmacologic treatment currently not in A.fib  --Continue to monitor  #GERD, stable.   At  home on Protonix 40 mg po BID and sucralfate 1g PO TID.  --Continue home regimen  #CHF Stable.  Echo 10/2014 with EF 60-65%.  At home on Lasix and Spironolactone.  --Continue Lasix 40 mg daily --Continue Spironolactone 100 mg oral daily  #CAD S/p Left heart cath July 2016 showing nonobstructive coronary artery disease.  Mid cx lesion previously treated with a bare metal stent in 10/2014.  Follows with Dr. Burt Knack outpatient.  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 RDW normal making production issue unlikely. Plt at baseline ~50.  At home on Promacta 100 mg po daily.   --Continue home regimen  --Monitor  #OSA, Stable Uses CPAP at home.  --CPAP ordered  #Depression, stable  Will continue home regimen   --Wellbutrin 150 mg po daily  FEN/GI: Heart healthy Prophylaxis: Protonix, SCD, SLIV   #Disposition: Will likely discharge home today Subjective:  Patient had already left for EGD this morning. There were no acute events overnight. Patient was stable.  Objective: Temp:  [98.2 F (36.8 C)-98.5 F (36.9 C)] 98.2 F (36.8 C) (01/05 0554) Pulse Rate:  [72-89] 89 (01/05 0554) Resp:  [10-18] 18 (01/05 0554) BP: (107-147)/(63-85) 110/70 (01/05 0554) SpO2:  [96 %-99 %] 99 % (01/05 0554)   Physical Exam: General: sitting on the edge of the bed eating his breakfast Cardiovascular: RRR, no murmurs appreciated Respiratory: CTAB, normal WOB on RA, no whezes Abdomen: soft, +BS,distended, no tenderness to palpation, no rebound, para site clean Extremities: moving all  spontaneously, no LE edema Skin: some scatter spider angioma's Neuro: A&Ox3, no focal deficits Psych: appropriate mood and affect   Laboratory:  Recent Labs Lab 11/22/16 0816 11/23/16 0759 11/24/16 1003  WBC 3.5* 2.8* 3.6*  HGB 10.0* 9.7* 11.3*  HCT 29.5* 28.4* 33.2*  PLT 48* 48*  61*    Recent Labs Lab 11/19/16 2008  11/21/16 0405 11/22/16 0816 11/23/16 0759 11/24/16 1003  NA  --   < > 133* 132* 137 136  K  --   < > 3.7 3.9 3.9 4.1  CL  --   < > 101 100* 104 104  CO2  --   < > 25 27 28 26   BUN  --   < > 10 10 10 12   CREATININE  --   < > 1.04 1.01 1.00 1.06  CALCIUM  --   < > 7.9* 8.2* 8.3* 8.7*  PROT 6.6  --  6.1* 6.4*  --   --   BILITOT 2.9*  --  1.4* 1.4*  --   --   ALKPHOS 90  --  84 80  --   --   ALT 46  --  40 35  --   --   AST 70*  --  57* 53*  --   --   GLUCOSE  --   < > 96 90 98 95  < > = values in this interval not displayed.  Imaging/Diagnostic Tests: US Paracentesis  Result Date: 11/21/2016 INDICATION: Cirrhosis, CHF, recurrent ascites, prior BRTO. Request made for diagnostic and therapeutic paracentesis. EXAM: ULTRASOUND GUIDED DIAGNOSTIC AND THERAPEUTIC PARACENTESIS MEDICATIONS: None. COMPLICATIONS: None immediate. PROCEDURE: Informed written consent was obtained from the patient after a discussion of the risks, benefits and alternatives to treatment. A timeout was performed prior to the initiation of the procedure. Initial ultrasound scanning demonstrates a large amount of ascites within the right mid to lower abdominal quadrant. The right mid to lower abdomen was prepped and draped in the usual sterile fashion. 1% lidocaine was used for local anesthesia. Following this, a Yueh catheter was introduced. An ultrasound image was saved for documentation purposes. The paracentesis was performed. The catheter was removed and a dressing was applied. The patient tolerated the procedure well without immediate post procedural complication. FINDINGS: A total of approximately 14.4 liters of light yellow fluid was removed. Samples were sent to the laboratory as requested by the clinical team. IMPRESSION: Successful ultrasound-guided diagnostic and therapeutic paracentesis yielding 14.4 liters of peritoneal fluid. Patient will be evaluated by IR service for  possible future TIPS. Read by: Rowe Robert, PA-C Electronically Signed   By: Sandi Mariscal M.D.   On: 11/21/2016 12:44    Marjie Skiff, MD 11/25/2016, 7:26 AM PGY-1, [place] Intern pager: [phone], text pages welcome

## 2016-11-25 NOTE — Discharge Summary (Signed)
[facility] Discharge Summary  Patient name: Vincent Ochoa Medical record number: [id number] Date of birth: 1956-03-12 Age: 61 y.o. Gender: male Date of Admission: 11/19/2016  Date of Discharge:11/25/2016 Admitting Physician: Blane Ohara McDiarmid, MD  Primary Care Provider: Ralene Ok, MD Consultants: GI, IR  Indication for Hospitalization: Abdominal pain and hematuria  Discharge Diagnoses/Problem List:  Alcoholic cirrhosis Hematuria GERD CHF OSA  Disposition: Home   Discharge Condition: Stable  Discharge Exam:  General: laying in bed in no acute Cardiovascular: RRR, no murmurs appreciated Respiratory: CTAB, normal WOB on RA, no whezes Abdomen: soft, +BS,distended, no tenderness to palpation, no rebound, para site clean Extremities: moving all spontaneously, no LE edema Skin: some scatter spider angioma's Neuro: A&Ox3, no focal deficits Psych: appropriate mood and affect  Brief Hospital Course:  Vincent Ochoa is a 61 y.o. male with a past medical history significant fo SBP, alcoholic cirrhosis of liver with ascites, CAD with NSTEMI, psoriasis, thrombocytopenia, OSA, obesity, CHF, GERD, depression, and PAF who presented with hematuria and abdominal pain. Patient initially presented with increase abdominal girth consistent with ascites seen on CT and gross hematuria. Patient was started on rocephin with concerns for SBP and flagyl was later on added for continued abdominal pain and episodes of emesis. Hematuria was thought to be related to coagulopathy though patient has a history of nephrolithiasis. Patient was seen by GI who rule out a bleed and recommended EGD with banding. Due to his history of refractory ascites and failed BRTO, IR was consulted to discuss TIPS and patient was scheduled for an outpatient procedure with IR and Dr. Pascal Lux. While inpatient , patient also had 2 paracentesis with a total of >20L taken out. Patient had an EGD with Dr. Watt Climes  and had 3 esophageal varices banded. Echo was performed as part of his pre TIPS work up and showed and EF of 55-60% with normal LV function. Hematuria was stable during stay and urology was not consulted. Hemoglobin was stable during hospitalization. Prior to discharge, patient was stable with close follow up with GI [facility]) and PCP as well as IR for TIPS procedure. Patient will be refer to Urology for workup of his hematuria and will also be scheduled for weekly paracentesis.  Issues for Follow Up:  1. Standing order need to be faxed to ultrasound ([place]) as patient will be receiving weekly paracentesis. Print standing order sheet from clinic, fill it out and faxed it to [place]. Patient will need albumin for large volume paracentesis (>5L). Give about 8 g for every liter out, only apply to large volume. 2. Make sure that patient has been in contact with IR and Dr.Watts office in regards to the TIPS procedure that he will receive. Echo was normal. 3. Assess adherence to discharge recommendation low sodium, and continued diuretic dose. 4. Make sure patient is taking Wellbutrin every other day instead of daily due to his severe hepatic impairement. Change was made this last admission.Promecta should be taken 50 mg daily with a max of 75 mg. Not 100 mg like he has been doing (hepatic impairment) 5. Please refer patient to [facility] for work up of hematuria.   Significant Procedures: EGD with banding, Echo, paracentesis  Significant Labs and Imaging:   Recent Labs Lab 11/23/16 0759 11/24/16 1003 11/25/16 0751  WBC 2.8* 3.6* 3.6*  HGB 9.7* 11.3* 10.4*  HCT 28.4* 33.2* 30.5*  PLT 48* 61* 56*    Recent Labs Lab 11/19/16 2008  11/20/16 1320 11/21/16 0405  11/22/16 0816 11/23/16 0759 11/24/16 1003 11/25/16 0751  NA  --   < >  --  133* 132* 137 136 135  K  --   < >  --  3.7 3.9 3.9 4.1 4.2  CL  --   < >  --  101 100* 104 104 103  CO2  --   < >  --  25 27 28 26 26   GLUCOSE  --   < >  --  96  90 98 95 87  BUN  --   < >  --  10 10 10 12 11   CREATININE  --   < >  --  1.04 1.01 1.00 1.06 1.04  CALCIUM  --   < >  --  7.9* 8.2* 8.3* 8.7* 8.4*  ALKPHOS 90  --   --  84 80  --   --   --   AST 70*  --   --  57* 53*  --   --   --   ALT 46  --   --  40 35  --   --   --   ALBUMIN 2.5*  --  2.4* 2.2* 2.9*  --   --   --   < > = values in this interval not displayed.  Dg Chest 2 View  Result Date: 11/19/2016 CLINICAL DATA:  Patient with right lower quadrant pain for 2 days. Shortness of breath. EXAM: CHEST  2 VIEW COMPARISON:  Chest radiograph 10/31/2016. FINDINGS: Stable cardiac and mediastinal contours. No consolidative pulmonary opacities. No pleural effusion or pneumothorax. Regional skeleton is unremarkable. IMPRESSION: No active cardiopulmonary disease. Electronically Signed   By: Lovey Newcomer M.D.   On: 11/19/2016 18:22   Ct Abdomen Pelvis W Contrast  Result Date: 11/19/2016 CLINICAL DATA:  Worsening of abdominal swelling. Cirrhosis and ascites. Sudden onset of right lower quadrant pain. EXAM: CT ABDOMEN AND PELVIS WITH CONTRAST TECHNIQUE: Multidetector CT imaging of the abdomen and pelvis was performed using the standard protocol following bolus administration of intravenous contrast. CONTRAST:  155mL ISOVUE-300 IOPAMIDOL (ISOVUE-300) INJECTION 61% COMPARISON:  09/13/2016 FINDINGS: Lower chest: Normal Hepatobiliary: Advanced cirrhosis of the liver. Small liver with nodular surface and relative prominence of the left lobe an uncinate process. No focal lesions seen. Previous cholecystectomy. Pancreas: Normal Spleen: Splenomegaly consistent with portal venous hypertension. Adrenals/Urinary Tract: No adrenal mass. Renal parenchyma is normal. 7 mm stone lower pole right kidney. 4 mm stone midportion right kidney. Stomach/Bowel: No acute bowel finding. Vascular/Lymphatic: Aortic atherosclerosis. Infrarenal abdominal aortic aneurysm maximal transverse diameter 3.1 cm. IVC is normal. Extensive upper  abdominal varices. Reproductive: Negative Other: Very large amount of ascites, freely distributed. Markedly increased since 2 months ago. Musculoskeletal: No acute finding. Ordinary spinal degenerative changes. IMPRESSION: Massive increase in ascites, freely distributed. No other change. Cirrhosis, splenomegaly and upper abdominal varices. Nonobstructing renal calculi on the right. 3.1 cm infrarenal abdominal aortic aneurysm. Electronically Signed   By: Nelson Chimes M.D.   On: 11/19/2016 21:41    Results/Tests Pending at Time of Discharge: None   Discharge Medications:  Allergies as of 11/25/2016      Reactions   Asa [aspirin] Other (See Comments)   Previous hemmorhage   Eliquis [apixaban] Other (See Comments)   Potentiality of internal bleeding   Nsaids Other (See Comments)   May cause bleeding issues (per wife) May only have 1 plain Tylenol every 8 hours is needed for flu symptoms, per doctor.   Plavix [clopidogrel]  Other (See Comments)   Potentiality of internal bleeding   Statins Other (See Comments)   Potentially may make patient bleed internally (per wife)      Medication List    TAKE these medications   atorvastatin 40 MG tablet Commonly known as:  LIPITOR Take 1 tablet (40 mg total) by mouth daily at 8 pm. What changed:  when to take this   buPROPion 150 MG 24 hr tablet Commonly known as:  WELLBUTRIN XL Take 1 tablet (150 mg total) by mouth every other day. What changed:  when to take this   carvedilol 3.125 MG tablet Commonly known as:  COREG Take 1 tablet (3.125 mg total) by mouth 2 (two) times daily with a meal.   eltrombopag 50 MG tablet Commonly known as:  PROMACTA Take 1 tablet (50 mg total) by mouth daily. Take on an empty stomach 1 hour before a meal or 2 hours after What changed:  how much to take  additional instructions   furosemide 40 MG tablet Commonly known as:  LASIX Take 0.5 tablets (20 mg total) by mouth 2 (two) times daily. What changed:  how  much to take   isosorbide mononitrate 30 MG 24 hr tablet Commonly known as:  IMDUR Take 1 tablet (30 mg total) by mouth daily.   nitroGLYCERIN 0.4 MG SL tablet Commonly known as:  NITROSTAT Place 1 tablet (0.4 mg total) under the tongue every 5 (five) minutes as needed for chest pain (up to 3 doses).   pantoprazole 40 MG tablet Commonly known as:  PROTONIX Take 1 tablet (40 mg total) by mouth 2 (two) times daily.   spironolactone 100 MG tablet Commonly known as:  ALDACTONE Take 1 tablet (100 mg total) by mouth daily. What changed:  medication strength  how much to take   sucralfate 1 g tablet Commonly known as:  CARAFATE Take 1 tablet (1 g total) by mouth 4 (four) times daily -  with meals and at bedtime. Dissolve in water. What changed:  when to take this  reasons to take this  additional instructions       Discharge Instructions: Please refer to Patient Instructions section of EMR for full details.  Patient was counseled important signs and symptoms that should prompt return to medical care, changes in medications, dietary instructions, activity restrictions, and follow up appointments.   Follow-Up Appointments: Follow-up Information    Bufford Lope, DO Follow up on 11/29/2016.   Why:  Your appointment is at 3:00 pm. Please arrive 15 min early. Contact information: [place] [address] [phone]           Marjie Skiff, MD 11/25/2016, 8:38 PM PGY-1, [place]

## 2016-11-25 NOTE — Progress Notes (Signed)
Vincent Ochoa 12:53 PM  Subjective: Patient says he still in the hospital because he is going to have her paracentesis but no other complaints and we discussed his diet and salt intake versus pepper  and discussed his banding yesterday Objective: Vital signs stable afebrile no acute distress exam unchanged labs stable  Assessment: Cirrhosis complicated with varices and ascites  Plan: See yesterday's note for my recommendations and plan hopefully home soon and please call my partner Dr. Cristina Gong this weekend if any question or problem from our standpoint  [facility]  Pager [phone] After 5PM or if no answer call [phone]

## 2016-11-25 NOTE — Procedures (Signed)
Successful US guided paracentesis from right lateral abdomen.  Yielded 9.1 liters of clear yellow fluid.  No immediate complications.  Pt tolerated well.   WENDY S BLAIR PA-C 11/25/2016 3:58 PM

## 2016-11-26 LAB — CULTURE, BODY FLUID-BOTTLE: CULTURE: NO GROWTH

## 2016-11-26 LAB — CULTURE, BODY FLUID W GRAM STAIN -BOTTLE

## 2016-11-29 ENCOUNTER — Ambulatory Visit (INDEPENDENT_AMBULATORY_CARE_PROVIDER_SITE_OTHER): Payer: Medicaid Other | Admitting: Family Medicine

## 2016-11-29 ENCOUNTER — Encounter: Payer: Self-pay | Admitting: Family Medicine

## 2016-11-29 VITALS — BP 128/62 | HR 76 | Temp 98.1°F | Ht 75.0 in | Wt 270.8 lb

## 2016-11-29 DIAGNOSIS — K7460 Unspecified cirrhosis of liver: Secondary | ICD-10-CM | POA: Diagnosis present

## 2016-11-29 DIAGNOSIS — R310 Gross hematuria: Secondary | ICD-10-CM | POA: Diagnosis not present

## 2016-11-29 DIAGNOSIS — K7031 Alcoholic cirrhosis of liver with ascites: Secondary | ICD-10-CM

## 2016-11-29 NOTE — Assessment & Plan Note (Signed)
Given recurrent large volume ascites, standing order placed for weekly therapeutic paracentesis at [facility]. Patient to be evaluated for possible TIPS procedure by IR as outpatient clinic appointment on 12/05/16. - Continue lasix and spironolactone  - Continue to follow with GI (Dr Watt Climes) - Discussed low sodium diet Follow up in 2-3 weeks

## 2016-11-29 NOTE — Progress Notes (Signed)
    Subjective:  Vincent Ochoa is a 61 y.o. male who presents to the [facility] today for a hospital follow up.   HPI: Abdominal pain 2/2 alcoholic cirrhosis and abdominal ascites Per chart review, last had therapeutic paracentesis on day of discharge 11/25/16, had 9.1L removed at that time. Had previous 14.4L earlier during hospital stay. Was told that he would be set up with weekly outpatient therapeutic paracentesis and would also be evaluated for possible TIPS procedure by IR as outpatient.  Since leaving the hospital has had progressive abdominal distention with some discomfort and mild pain especially at costal margin and midline of abdomen.  Has not yet heard from IR but is hopeful that TIPS would bring him some relief.  Hematuria Has had multiple times in the past and then presented with new onset of gross hematuria at most recent hospital admission on 11/19/16. UA on hospital admit showed +for gross hematuria, neg nitrite and trace leuks. Does have h/o kidney stones in the past but CT abd shows a non-obstructing kidney stone. Continues to have hematuria and still wishes to see urology   ROS: Per HPI  Objective:  Physical Exam: BP 128/62   Pulse 76   Temp 98.1 F (36.7 C) (Oral)   Ht 6\' 3"  (1.905 m)   Wt 270 lb 12.8 oz (122.8 kg)   BMI 33.85 kg/m   Gen: NAD, resting comfortably. CV: RRR with no murmurs appreciated Pulm: NWOB, CTAB with no crackles, wheezes, or rhonchi GI: Distended but soft, nontender, normal bowel sounds. No rebound.  MSK: no edema, cyanosis, or clubbing noted Skin: warm, dry. Scattered spider angiomas Neuro: grossly normal, moves all extremities Psych: Normal affect and thought content   Assessment/Plan:  Alcoholic cirrhosis of liver with ascites ([place]) Given recurrent large volume ascites, standing order placed for weekly therapeutic paracentesis at [facility]. Patient to be evaluated for possible TIPS procedure by IR as outpatient clinic appointment on  12/05/16. - Continue lasix and spironolactone  - Continue to follow with GI (Dr Watt Climes) - Discussed low sodium diet Follow up in 2-3 weeks   Hematuria Amb referral to hematology placed.   Bufford Lope, DO PGY-1, [facility] 11/29/2016 5:44 PM

## 2016-11-29 NOTE — Patient Instructions (Signed)
It was great to see you again!  - Ultrasound will be calling you to schedule your first paracentesis this week.  - Please let us know if you have not heard from IR about your TIPS procedure within the next 2 weeks.  Please make an appointment to be seen by your PCP in 2-3 weeks  Take care and seek immediate care sooner if you develop any concerns.   Dr. Bufford Lope, [place]

## 2016-11-29 NOTE — Assessment & Plan Note (Signed)
Amb referral to hematology placed.

## 2016-12-01 ENCOUNTER — Telehealth: Payer: Self-pay | Admitting: *Deleted

## 2016-12-01 ENCOUNTER — Other Ambulatory Visit: Payer: Self-pay | Admitting: Family Medicine

## 2016-12-01 ENCOUNTER — Telehealth: Payer: Self-pay | Admitting: Hematology and Oncology

## 2016-12-01 ENCOUNTER — Ambulatory Visit (HOSPITAL_BASED_OUTPATIENT_CLINIC_OR_DEPARTMENT_OTHER): Payer: Medicaid Other | Admitting: Hematology and Oncology

## 2016-12-01 ENCOUNTER — Encounter: Payer: Self-pay | Admitting: Hematology and Oncology

## 2016-12-01 ENCOUNTER — Other Ambulatory Visit (HOSPITAL_BASED_OUTPATIENT_CLINIC_OR_DEPARTMENT_OTHER): Payer: Medicaid Other

## 2016-12-01 VITALS — BP 120/67 | HR 76 | Temp 98.2°F | Resp 18 | Ht 75.0 in | Wt 283.0 lb

## 2016-12-01 DIAGNOSIS — D693 Immune thrombocytopenic purpura: Secondary | ICD-10-CM

## 2016-12-01 DIAGNOSIS — K7460 Unspecified cirrhosis of liver: Secondary | ICD-10-CM

## 2016-12-01 DIAGNOSIS — D696 Thrombocytopenia, unspecified: Secondary | ICD-10-CM

## 2016-12-01 DIAGNOSIS — R188 Other ascites: Secondary | ICD-10-CM | POA: Diagnosis not present

## 2016-12-01 DIAGNOSIS — D638 Anemia in other chronic diseases classified elsewhere: Secondary | ICD-10-CM

## 2016-12-01 LAB — CBC WITH DIFFERENTIAL/PLATELET
BASO%: 0.2 % (ref 0.0–2.0)
Basophils Absolute: 0 10*3/uL (ref 0.0–0.1)
EOS%: 2.1 % (ref 0.0–7.0)
Eosinophils Absolute: 0.1 10*3/uL (ref 0.0–0.5)
HCT: 33.7 % — ABNORMAL LOW (ref 38.4–49.9)
HGB: 11.4 g/dL — ABNORMAL LOW (ref 13.0–17.1)
LYMPH%: 20.3 % (ref 14.0–49.0)
MCH: 30.3 pg (ref 27.2–33.4)
MCHC: 33.8 g/dL (ref 32.0–36.0)
MCV: 89.6 fL (ref 79.3–98.0)
MONO#: 0.7 10*3/uL (ref 0.1–0.9)
MONO%: 12.5 % (ref 0.0–14.0)
NEUT#: 3.7 10*3/uL (ref 1.5–6.5)
NEUT%: 64.9 % (ref 39.0–75.0)
Platelets: 90 10*3/uL — ABNORMAL LOW (ref 140–400)
RBC: 3.76 10*6/uL — AB (ref 4.20–5.82)
RDW: 15.6 % — AB (ref 11.0–14.6)
WBC: 5.6 10*3/uL (ref 4.0–10.3)
lymph#: 1.1 10*3/uL (ref 0.9–3.3)
nRBC: 0 % (ref 0–0)

## 2016-12-01 MED ORDER — MULTIVITAMIN GUMMIES ADULT PO CHEW: 1.0000 | CHEWABLE_TABLET | Freq: Every day | ORAL | 11 refills | Status: AC

## 2016-12-01 MED ORDER — ELTROMBOPAG OLAMINE 50 MG PO TABS: 50.0000 mg | ORAL_TABLET | Freq: Every day | ORAL | 11 refills | Status: DC

## 2016-12-01 NOTE — Assessment & Plan Note (Signed)
This is likely anemia of chronic disease. The patient denies recent history of bleeding such as epistaxis, hematuria or hematochezia. He is asymptomatic from the anemia. We will observe for now.  He does not require transfusion now.   

## 2016-12-01 NOTE — Telephone Encounter (Signed)
Pt stated on today's visit he needs refill on his Promacta.  I read in his chart we faxed a new Rx for Promacta 50 mg to [facility] [place] on 12/21 with 11 refills.  I called pt and left Vm with phone number [phone], to call [employer] to arrange delivery of his refill.  Please call us back if any questions or problems.

## 2016-12-01 NOTE — Assessment & Plan Note (Signed)
The patient had liver cirrhosis and recent ascites, with therapeutic paracentesis scheduled weekly The patient is compliant taking Spironolactone and furosemide. I recommend close follow-up with GI specialist for further management of ascites

## 2016-12-01 NOTE — Assessment & Plan Note (Signed)
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 The patient has been tried on IVIG and prednisone without beneficial bumping platelet count. We discussed the risk and benefit of Promacta and he agreed to proceed. So far, the patient have no benefit from low-dose Promacta. With increased dose Promacta, we have achieved the target goal of increasing his platelet count to greater than 50,000 We will maintain the same dose I plan to see him back in 2 months for further review

## 2016-12-01 NOTE — Telephone Encounter (Signed)
Gave patient avs report and appointments for March  °

## 2016-12-01 NOTE — Progress Notes (Signed)
[place] OFFICE PROGRESS NOTE  Bufford Lope, DO SUMMARY OF HEMATOLOGIC HISTORY:  Vincent Ochoa had history of cardiac disease was admitted via Emergency Department for recurrent chest pain.He was initially seen in consultation on 12/8 for thrombocytopenia, felt to be likely autoimmune (related to psoriasis and liver disease), responding well to 2 doses of IVIG in addition to prednisone and transfusions. Patient had been discharged on 12/11 home with baby aspirin. He was subsequently readmitted to the hospital again on 11/03/2014 with recurrent chest pain and subsequently underwent cardiac catheterization and placement of bare metal stent on 11/04/2014. On 12/31/2014, he discontinued prednisone Throughout 2017, he had recurrent admission to the hospital due to chest pain, acute GI bleed, encephalopathy, bleeding gastric varices, alcoholic cirrhosis of the liver and respiratory failure. He received embolization of bleeding vessels and recurrent episodes of paracentesis for abdominal ascites On 08/17/2016, EGD revealed large varices were found in the lower third of the esophagus, treated successfully with bending. Scattered moderate inflammation/gastritis was noted in the stomach On 09/26/2016, colonoscopy revealed internal hemorrhoids On 10/14/2016, he underwent ultrasound-guided paracentesis of which more than 10 L of ascitic fluid was removed On 11/02/2016, he started Promacta 25 mg daily On 11/10/16, the dose Promacta is increased to 50 mg daily INTERVAL HISTORY: Vincent Ochoa 61 y.o. male returns for further follow-up. He feels well on Promacta. The patient denies any recent signs or symptoms of bleeding such as spontaneous epistaxis, hematuria or hematochezia. He has been getting weekly paracentesis. He denies chest pain or shortness of breath Denies recent infection  I have reviewed the past medical history, past surgical history, social history and family history with the  patient and they are unchanged from previous note.  ALLERGIES:  is allergic to asa [aspirin]; eliquis [apixaban]; nsaids; plavix [clopidogrel]; and statins.  MEDICATIONS:  Current Outpatient Prescriptions  Medication Sig Dispense Refill  . atorvastatin (LIPITOR) 40 MG tablet Take 1 tablet (40 mg total) by mouth daily at 8 pm. (Patient taking differently: Take 40 mg by mouth daily. ) 30 tablet 0  . buPROPion (WELLBUTRIN XL) 150 MG 24 hr tablet Take 1 tablet (150 mg total) by mouth every other day. 30 tablet 3  . carvedilol (COREG) 3.125 MG tablet Take 1 tablet (3.125 mg total) by mouth 2 (two) times daily with a meal. 60 tablet 0  . eltrombopag (PROMACTA) 50 MG tablet Take 1 tablet (50 mg total) by mouth daily. Take on an empty stomach 1 hour before a meal or 2 hours after 30 tablet 11  . furosemide (LASIX) 40 MG tablet Take 0.5 tablets (20 mg total) by mouth 2 (two) times daily. (Patient taking differently: Take 40 mg by mouth 2 (two) times daily. ) 60 tablet 1  . isosorbide mononitrate (IMDUR) 30 MG 24 hr tablet Take 1 tablet (30 mg total) by mouth daily. 30 tablet 1  . nitroGLYCERIN (NITROSTAT) 0.4 MG SL tablet Place 1 tablet (0.4 mg total) under the tongue every 5 (five) minutes as needed for chest pain (up to 3 doses). 25 tablet 3  . pantoprazole (PROTONIX) 40 MG tablet Take 1 tablet (40 mg total) by mouth 2 (two) times daily. 60 tablet 2  . spironolactone (ALDACTONE) 100 MG tablet Take 1 tablet (100 mg total) by mouth daily. 30 tablet 0  . sucralfate (CARAFATE) 1 g tablet Take 1 tablet (1 g total) by mouth 4 (four) times daily -  with meals and at bedtime. Dissolve in water. (Patient taking  differently: Take 1 g by mouth 3 (three) times daily as needed (stomach irritation). Dissolve in water.) 120 tablet 2  . Multiple Vitamins-Minerals (MULTIVITAMIN GUMMIES ADULT) CHEW Chew 1 tablet by mouth daily. 30 tablet 11   No current facility-administered medications for this visit.      REVIEW OF  SYSTEMS:   Constitutional: Denies fevers, chills or night sweats Eyes: Denies blurriness of vision Ears, nose, mouth, throat, and face: Denies mucositis or sore throat Respiratory: Denies cough, dyspnea or wheezes Cardiovascular: Denies palpitation, chest discomfort or lower extremity swelling Gastrointestinal:  Denies nausea, heartburn or change in bowel habits Skin: Denies abnormal skin rashes Lymphatics: Denies new lymphadenopathy or easy bruising Neurological:Denies numbness, tingling or new weaknesses Behavioral/Psych: Mood is stable, no new changes  All other systems were reviewed with the patient and are negative.  PHYSICAL EXAMINATION: ECOG PERFORMANCE STATUS: 1 - Symptomatic but completely ambulatory  Vitals:   12/01/16 1346  BP: 120/67  Pulse: 76  Resp: 18  Temp: 98.2 F (36.8 C)   Filed Weights   12/01/16 1346  Weight: 283 lb (128.4 kg)    GENERAL:alert, no distress and comfortable. He is morbidly obese. SKIN: skin color, texture, turgor are normal, no rashes or significant lesions EYES: normal, Conjunctiva are pink and non-injected, sclera clear ABDOMEN:abdomen is distended with ascites Musculoskeletal:no cyanosis of digits and no clubbing  NEURO: alert & oriented x 3 with fluent speech, no focal motor/sensory deficits  LABORATORY DATA:  I have reviewed the data as listed     Component Value Date/Time   NA 135 11/25/2016 0751   NA 139 10/18/2016 1338   K 4.2 11/25/2016 0751   K 4.0 10/18/2016 1338   CL 103 11/25/2016 0751   CO2 26 11/25/2016 0751   CO2 28 10/18/2016 1338   GLUCOSE 87 11/25/2016 0751   GLUCOSE 108 10/18/2016 1338   BUN 11 11/25/2016 0751   BUN 14.4 10/18/2016 1338   CREATININE 1.04 11/25/2016 0751   CREATININE 1.2 10/18/2016 1338   CALCIUM 8.4 (L) 11/25/2016 0751   CALCIUM 8.6 10/18/2016 1338   PROT 6.4 (L) 11/22/2016 0816   PROT 7.0 10/18/2016 1338   ALBUMIN 2.9 (L) 11/22/2016 0816   ALBUMIN 2.5 (L) 10/18/2016 1338   AST 53 (H)  11/22/2016 0816   AST 84 (H) 10/18/2016 1338   ALT 35 11/22/2016 0816   ALT 56 (H) 10/18/2016 1338   ALKPHOS 80 11/22/2016 0816   ALKPHOS 111 10/18/2016 1338   BILITOT 1.4 (H) 11/22/2016 0816   BILITOT 1.92 (H) 10/18/2016 1338   GFRNONAA >60 11/25/2016 0751   GFRNONAA 62 03/03/2016 1102   GFRAA >60 11/25/2016 0751   GFRAA 71 03/03/2016 1102    No results found for: SPEP, UPEP  Lab Results  Component Value Date   WBC 5.6 12/01/2016   NEUTROABS 3.7 12/01/2016   HGB 11.4 (L) 12/01/2016   HCT 33.7 (L) 12/01/2016   MCV 89.6 12/01/2016   PLT 90 (L) 12/01/2016      Chemistry      Component Value Date/Time   NA 135 11/25/2016 0751   NA 139 10/18/2016 1338   K 4.2 11/25/2016 0751   K 4.0 10/18/2016 1338   CL 103 11/25/2016 0751   CO2 26 11/25/2016 0751   CO2 28 10/18/2016 1338   BUN 11 11/25/2016 0751   BUN 14.4 10/18/2016 1338   CREATININE 1.04 11/25/2016 0751   CREATININE 1.2 10/18/2016 1338      Component Value Date/Time  CALCIUM 8.4 (L) 11/25/2016 0751   CALCIUM 8.6 10/18/2016 1338   ALKPHOS 80 11/22/2016 0816   ALKPHOS 111 10/18/2016 1338   AST 53 (H) 11/22/2016 0816   AST 84 (H) 10/18/2016 1338   ALT 35 11/22/2016 0816   ALT 56 (H) 10/18/2016 1338   BILITOT 1.4 (H) 11/22/2016 0816   BILITOT 1.92 (H) 10/18/2016 1338      ASSESSMENT & PLAN:  Chronic ITP (idiopathic thrombocytopenia) (H[facility]) He has 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 The patient has been tried on IVIG and prednisone without beneficial bumping platelet count. We discussed the risk and benefit of Promacta and he agreed to proceed. So far, the patient have no benefit from low-dose Promacta. With increased dose Promacta, we have achieved the target goal of increasing his platelet count to greater than 50,000 We will maintain the same dose I plan to see him back in 2  months for further review  Cirrhosis of liver with ascites ([place]) The patient had liver cirrhosis and recent ascites, with therapeutic paracentesis scheduled weekly The patient is compliant taking Spironolactone and furosemide. I recommend close follow-up with GI specialist for further management of ascites  Anemia due to chronic illness This is likely anemia of chronic disease. The patient denies recent history of bleeding such as epistaxis, hematuria or hematochezia. He is asymptomatic from the anemia. We will observe for now.  He does not require transfusion now.     No orders of the defined types were placed in this encounter.   All questions were answered. The patient knows to call the clinic with any problems, questions or concerns. No barriers to learning was detected.  I spent 15 minutes counseling the patient face to face. The total time spent in the appointment was 20 minutes and more than 50% was on counseling.     Heath Lark, MD 1/11/20182:51 PM

## 2016-12-02 ENCOUNTER — Ambulatory Visit (HOSPITAL_COMMUNITY)
Admission: RE | Admit: 2016-12-02 | Discharge: 2016-12-02 | Disposition: A | Discharge status: Home / Self Care | Payer: Medicaid Other | Source: Ambulatory Visit | Attending: Family Medicine | Admitting: Family Medicine

## 2016-12-02 ENCOUNTER — Other Ambulatory Visit: Payer: Self-pay | Admitting: Family Medicine

## 2016-12-02 DIAGNOSIS — K7031 Alcoholic cirrhosis of liver with ascites: Secondary | ICD-10-CM | POA: Diagnosis present

## 2016-12-02 MED ORDER — LIDOCAINE HCL 1 % IJ SOLN
INTRAMUSCULAR | Status: AC
  Filled 2016-12-02: qty 20

## 2016-12-02 MED ORDER — ALBUMIN HUMAN 25 % IV SOLN
25.0000 g | Freq: Once | INTRAVENOUS | Status: AC
  Administered 2016-12-02: 25 g via INTRAVENOUS
  Filled 2016-12-02 (×2): qty 100

## 2016-12-02 NOTE — Procedures (Signed)
Ultrasound-guided therapeutic paracentesis performed yielding 7.7 liters of yellow colored fluid. No immediate complications.  He will receive albumin after the procedure.  Nyxon Strupp E 2:54 PM 12/02/2016

## 2016-12-07 ENCOUNTER — Telehealth: Payer: Self-pay | Admitting: Family Medicine

## 2016-12-07 NOTE — Telephone Encounter (Signed)
Family Medicine After hours phone call  Patient's wife called stating that patient had some RUQ pain and fever for the past 8 hrs. No other symptoms at the time. Otherwise at his baseline. She is asking for recs.  I informed her that it was difficult to assess him w/o an exam. That said, with his complicated PMH I feel he should be seen in the ED for evaluation. Wife did not seem to think that was necessary. She just wanted to control the fever and help him feel better.   I told her that tylenol would not be ok to give due to liver dz. Ibuprofen would be ideal. She stated that she didn't have ibuprofen but had Aleve. I informed her that although I still recommended having him seen in the ED (or at least UC) that he was free to do as he chooses and that 1 aleve Q12hr PRN may help improve the discomfort from any fever.   I informed her that these recommendations were unlikely to change today. If she had additional questions for clarification then to call back. She stated her understanding and had no further questions.  Elberta Leatherwood, MD,MS,  PGY3 12/07/2016 12:26 PM

## 2016-12-09 ENCOUNTER — Ambulatory Visit (HOSPITAL_COMMUNITY)
Admission: RE | Admit: 2016-12-09 | Discharge: 2016-12-09 | Disposition: A | Discharge status: Home / Self Care | Payer: Medicaid Other | Source: Ambulatory Visit | Attending: Family Medicine | Admitting: Family Medicine

## 2016-12-09 ENCOUNTER — Telehealth: Payer: Self-pay | Admitting: *Deleted

## 2016-12-09 ENCOUNTER — Other Ambulatory Visit: Payer: Self-pay | Admitting: Family Medicine

## 2016-12-09 ENCOUNTER — Telehealth: Payer: Self-pay | Admitting: Family Medicine

## 2016-12-09 DIAGNOSIS — K7031 Alcoholic cirrhosis of liver with ascites: Secondary | ICD-10-CM | POA: Diagnosis not present

## 2016-12-09 MED ORDER — ALBUMIN HUMAN 5 % IV SOLN: 64.0000 g | Freq: Once | INTRAVENOUS | Status: DC

## 2016-12-09 MED ORDER — LIDOCAINE HCL (PF) 1 % IJ SOLN
INTRAMUSCULAR | Status: AC
  Filled 2016-12-09: qty 10

## 2016-12-09 MED ORDER — ALBUMIN HUMAN 25 % IV SOLN
64.0000 g | Freq: Once | INTRAVENOUS | Status: DC
  Filled 2016-12-09: qty 300

## 2016-12-09 MED ORDER — ALBUMIN HUMAN 25 % IV SOLN
62.5000 g | Freq: Once | INTRAVENOUS | Status: AC
  Administered 2016-12-09: 62.5 g via INTRAVENOUS
  Filled 2016-12-09: qty 300

## 2016-12-09 NOTE — Telephone Encounter (Signed)
Attempted to call back Vincent Ochoa at [phone] before close of business today and left message with scheduling desk voicemail. Patient will continue to need weekly paracentesis at least until he is evaluated for possible TIPS procedure as outpatient. Appt with IR is scheduled for 12/15/16.

## 2016-12-09 NOTE — Telephone Encounter (Signed)
Patient called this morning to report he was having pain from his last paracentesis and states that the tech "hit his liver".  He requested that a provider meet him down in radiology to check on this pain today.  Informed patient that we are unable to have one of our provider's meet him down there as they are seeing other patient's today or are in other rotation sites.  He voiced understanding and I advised him to inform his tech today of the pain he is experiencing and see what they can tell him.  Also offered to him that he see if he could speak with a radiology while he was down there or he could make an appointment to be seen this afternoon.  Patient declined an appointment here today.  Will forward to his PCP to make her aware. Roddy Bellamy,CMA

## 2016-12-09 NOTE — Telephone Encounter (Signed)
Family Medicine After hours phone call  Patient called. In "a lot of pain". Having nausea and vomiting. Unable to take PO very well. No fevers at this time. Patient has an appt w/ Radiology later today.  I informed him that he needs to make this appt. It would also be ideal to be seen in PCPs office after this appt.   Patient stated that he felt as though "something isn't being done right" b/c he "shouldn't be in this much pain". I informed him that he was advised to go to the ED 2 days ago, which he and his wife did not do. I informed him that if he ever feels as though something is not right then immediate ED visits would be warranted. Patient stated his understanding. No further questions.  Elberta Leatherwood, MD,MS,  PGY3 12/09/2016 10:09 AM

## 2016-12-09 NOTE — Procedures (Signed)
   US guided RLQ paracentesis 7.7 L yellow fluid obtained  Pt tolerated well  8 gr/L Albumin IV post procedure per MD

## 2016-12-09 NOTE — Telephone Encounter (Signed)
Radiology called to check if patient will continue to do his weekly paracentesis.  Patient has completed 4 rounds already.  Please give them a call back at [phone] and ask for [facility].  Derl Barrow, RN

## 2016-12-12 ENCOUNTER — Other Ambulatory Visit: Payer: Self-pay | Admitting: Family Medicine

## 2016-12-12 ENCOUNTER — Telehealth: Payer: Self-pay

## 2016-12-12 DIAGNOSIS — K7031 Alcoholic cirrhosis of liver with ascites: Secondary | ICD-10-CM

## 2016-12-12 NOTE — Telephone Encounter (Signed)
Wife called requesting Promacta refill. Gave patient's wife the phone number for [employer] and informed her to call them to arrange delivery. Verbalized understanding.

## 2016-12-13 ENCOUNTER — Other Ambulatory Visit: Payer: Self-pay | Admitting: Family Medicine

## 2016-12-13 DIAGNOSIS — K7031 Alcoholic cirrhosis of liver with ascites: Secondary | ICD-10-CM

## 2016-12-13 NOTE — Telephone Encounter (Signed)
Spoke with Manuela Schwartz in radiology and informed her that patient is seeing PCP tomorrow and what the plan is currently for patient.  Yaron Grasse,CMA

## 2016-12-14 ENCOUNTER — Encounter: Payer: Self-pay | Admitting: Family Medicine

## 2016-12-14 ENCOUNTER — Ambulatory Visit (INDEPENDENT_AMBULATORY_CARE_PROVIDER_SITE_OTHER): Payer: Medicaid Other | Admitting: Family Medicine

## 2016-12-14 VITALS — BP 120/68 | HR 103 | Temp 98.0°F | Ht 75.0 in | Wt 281.0 lb

## 2016-12-14 DIAGNOSIS — I10 Essential (primary) hypertension: Secondary | ICD-10-CM

## 2016-12-14 DIAGNOSIS — F329 Major depressive disorder, single episode, unspecified: Secondary | ICD-10-CM

## 2016-12-14 DIAGNOSIS — K7031 Alcoholic cirrhosis of liver with ascites: Secondary | ICD-10-CM | POA: Diagnosis not present

## 2016-12-14 DIAGNOSIS — K7460 Unspecified cirrhosis of liver: Secondary | ICD-10-CM | POA: Diagnosis present

## 2016-12-14 DIAGNOSIS — K219 Gastro-esophageal reflux disease without esophagitis: Secondary | ICD-10-CM

## 2016-12-14 DIAGNOSIS — F32A Depression, unspecified: Secondary | ICD-10-CM

## 2016-12-14 MED ORDER — PANTOPRAZOLE SODIUM 40 MG PO TBEC: 40.0000 mg | DELAYED_RELEASE_TABLET | Freq: Two times a day (BID) | ORAL | 2 refills | Status: AC

## 2016-12-14 MED ORDER — FUROSEMIDE 40 MG PO TABS: ORAL_TABLET | ORAL | 2 refills | Status: DC

## 2016-12-14 MED ORDER — ISOSORBIDE MONONITRATE ER 30 MG PO TB24: 30.0000 mg | ORAL_TABLET | Freq: Every day | ORAL | 2 refills | Status: DC

## 2016-12-14 MED ORDER — OXYCODONE HCL 5 MG PO CAPS: 5.0000 mg | ORAL_CAPSULE | Freq: Three times a day (TID) | ORAL | 0 refills | Status: DC | PRN

## 2016-12-14 MED ORDER — BUPROPION HCL ER (XL) 150 MG PO TB24: 150.0000 mg | ORAL_TABLET | ORAL | 2 refills | Status: AC

## 2016-12-14 MED ORDER — SPIRONOLACTONE 100 MG PO TABS: 100.0000 mg | ORAL_TABLET | Freq: Every day | ORAL | 2 refills | Status: DC

## 2016-12-14 NOTE — Progress Notes (Deleted)
    Subjective:  Vincent Ochoa is a 61 y.o. male who presents to the [facility] today with a chief complaint of ***.   HPI:   ***HIST  Objective:  Physical Exam: BP 120/68 (BP Location: Left Arm, Patient Position: Sitting, Cuff Size: Normal)   Pulse (!) 103   Temp 98 F (36.7 C) (Oral)   Ht 6\' 3"  (1.905 m)   Wt 281 lb (127.5 kg)   SpO2 95%   BMI 35.12 kg/m   Gen: ***NAD, resting comfortably CV: RRR with no murmurs appreciated Pulm: NWOB, CTAB with no crackles, wheezes, or rhonchi GI: Normal bowel sounds present. Soft, Nontender, Nondistended. MSK: no edema, cyanosis, or clubbing noted Skin: warm, dry Neuro: grossly normal, moves all extremities Psych: Normal affect and thought content  No results found for this or any previous visit (from the past 72 hour(s)).   Assessment/Plan:  No problem-specific Assessment & Plan notes found for this encounter.   Bufford Lope, DO PGY-***, [place] [facility] 12/14/2016 3:10 PM

## 2016-12-14 NOTE — Assessment & Plan Note (Signed)
Continued to have recurrent large volume ascites, approximately ~8L removed each week. Standing order placed for weekly therapeutic paracentesis at [facility] is still active and will need to be continued until IR evaluation for possible TIPS. Suspect his abdomen pain is from distention and irritation given frequent procedures. No concern for SBP at this time since afebrile, no acute abdomen, and BP stable. Will need close monitoring -Patient has appointment with IR (Dr. Pascal Lux) 12/15/16 tomorrow.  - Continue weekly therapeutic paracentesis at least until can get IR evaluation/recommendation for possible TIPS. - Continue spironolactone 100mg  qd and will increase lasix to 40mg  in am and keep 20mg  in pm - Patient follows with GI (Dr Watt Climes), appointment on 12/22/16. Appreciate recommendations. - Discussed low sodium diet - Prescribed oxycodone 5mg  q8h prn pain and advised patient to use sparingly and cautiously.  - Will monitor closely given high risk for infection. Follow up in 1 month

## 2016-12-14 NOTE — Patient Instructions (Signed)
It was great to see you again,  - For your belly pain, I have prescribed you some opiate pain pills: oxycodone 5mg  every 8 hours as needed. Please try to use sparingly and be careful to avoid operating heavy machinery or driving while on them. It may affect you more than others so when starting them just be careful and try them slowly until you know how you react. - I am increasing your morning lasix to 40mg , keep taking 20mg  in the evening. I hope this will help with the fluid in your belly. If you feel like you aren't peeing as much on this increased dose you can do back to 20mg  twice daily.   Take care and seek immediate care sooner if you develop any concerns.  Dr. Bufford Lope, [place]

## 2016-12-14 NOTE — Progress Notes (Signed)
    Subjective:  Vincent Ochoa is a 61 y.o. male who presents to the [facility] today with a chief complaint of abdominal pain.   HPI: Abdominal pain 2/2 alcoholic cirrhosis and abdominal ascites - Has been having therapeutic paracentesis weekly that was set up last visit at hospital follow up. Last paracentesis had ~8L removed which has become typical for him. Has continued worsening abdominal distention and abdominal pain at costal margin and midline of abdomen. Pain is worse when belly is distended and when he eats. Denies nausea/vomiting. Has regular BMs and denies dysuria. Has some night sweats but no fever/chills. - Is seeing Dr Pascal Lux IR tomorrow for initial visit to be evaluated for possible TIPS. Was told by IR to keep weekly paracentesis for now, next is scheduled for Friday.  - Has appointment with GI Dr. Watt Climes on 12/22/16    ROS: Per HPI  Objective:  Physical Exam: BP 120/68 (BP Location: Left Arm, Patient Position: Sitting, Cuff Size: Normal)   Pulse (!) 103   Temp 98 F (36.7 C) (Oral)   Ht 6\' 3"  (1.905 m)   Wt 281 lb (127.5 kg)   SpO2 95%   BMI 35.12 kg/m   Gen: NAD, resting comfortably CV: RRR with no murmurs appreciated Pulm: NWOB, CTAB with no crackles, wheezes, or rhonchi GI: Significantly distended abdomen, larger than last visit on 11/29/16. + fluid wave. Diffusely TTP but no rebound or guarding. Soft.  MSK: no edema, cyanosis, or clubbing noted Skin: warm, dry Neuro: grossly normal, moves all extremities Psych: Normal affect and thought content   Assessment/Plan:  Alcoholic cirrhosis of liver with ascites (HCC) Continued to have recurrent large volume ascites, approximately ~8L removed each week. Standing order placed for weekly therapeutic paracentesis at [facility] is still active and will need to be continued until IR evaluation for possible TIPS. Suspect his abdomen pain is from distention and irritation given frequent procedures. No concern for SBP at this time  since afebrile, no acute abdomen, and BP stable. Will need close monitoring -Patient has appointment with IR (Dr. Pascal Lux) 12/15/16 tomorrow.  - Continue weekly therapeutic paracentesis at least until can get IR evaluation/recommendation for possible TIPS. - Continue spironolactone 100mg  qd and will increase lasix to 40mg  in am and keep 20mg  in pm - Patient follows with GI (Dr Watt Climes), appointment on 12/22/16. Appreciate recommendations. - Discussed low sodium diet - Prescribed oxycodone 5mg  q8h prn pain and advised patient to use sparingly and cautiously.  - Will monitor closely given high risk for infection. Follow up in 1 month   Bufford Lope, DO PGY-1, [place] [facility] 12/14/2016 4:04 PM

## 2016-12-15 ENCOUNTER — Ambulatory Visit
Admission: RE | Admit: 2016-12-15 | Discharge: 2016-12-15 | Disposition: A | Discharge status: Home / Self Care | Payer: Medicaid Other | Source: Ambulatory Visit | Attending: Student | Admitting: Student

## 2016-12-15 ENCOUNTER — Encounter: Payer: Self-pay | Admitting: Radiology

## 2016-12-15 DIAGNOSIS — K7460 Unspecified cirrhosis of liver: Secondary | ICD-10-CM

## 2016-12-15 DIAGNOSIS — R188 Other ascites: Principal | ICD-10-CM

## 2016-12-15 HISTORY — PX: IR GENERIC HISTORICAL: IMG1180011

## 2016-12-15 NOTE — Progress Notes (Signed)
Patient ID: Vincent Ochoa, male   DOB: 03/07/1956, 61 y.o.   MRN: [id number]         Chief Complaint: Recurrent symptomatic ascites  Referring Physician(s): Magod (GI) Burt Knack (Cardiology) [name] (IM)  History of Present Illness: Vincent Ochoa is a 61 y.o. male with past history significant for CAD, chronic back pain, morbid obesity, obstructive sleep apnea, paroxysmal atrial fibrillation, psoriasis, stroke, thrombocytopenia, cirrhosis and ascites who underwent a technically successful BRTO procedure on 02/05/2016 performed for life-threatening hematemesis. Post residual imaging demonstrates a technically excellent result following BRTO without recurrence of clinically significant hematemesis however unfortunately, the patient's hepatic synthetic function has deteriorated necessitating frequent large-volume paracenteses. Patient returns today to the interventional radiology clinic to discuss proceeding with a TIPS procedure. The patient is accompanied by his wife though serves as his own historian.  The patient has now undergoing weekly large volume paracenteses, most recently performed on 12/09/2016 yielding 7.7 L of peritoneal fluid. Patient's diuretic medicine has recently been increased, currently taking Lasix 40 mg in the morning and 20 mg in the evening. He continues on spironolactone 100 mg daily.  Patient reports worsening abdominal pain which she attributes to the recurrent ascites.  The patient wife admits that over the course of this past year, he has had worsening mental status and is easily forgetful.  The patient denies yellowing of the skin or eyes. No melena. No unintentional weight loss. Activity levels has worsened recently.   Past Medical History:  Diagnosis Date  . Arthritis    "hands, knees, back" (07/05/2016)  . Back pain, lumbosacral 2014  . CHF (congestive heart failure) ([place])   . Cirrhosis ([place])   . Coronary artery disease    a. Evaluated by Dr. Stanford Breed 2012 -  nonobstructive 2007-2008 by cath. b. Abnormal stress test 10/2014 but cath deferred temporarily due to thrombocytopenia. c. 10/2014: readm with AF-RVR/NSTEMI - s/p BMS to mLCx 11/04/14 (mod dz in RCA);  d. relook cath 01/2015 and 06/02/2015 patent stent, nonobs CAD, EF 55-65%.  . Elevated LFTs   . Facial cellulitis 2014   Periorbital  . Fatty liver US - 2012  . Frequent headaches   . GERD (gastroesophageal reflux disease)   . History of blood transfusion 01/2016   "lost alot of blood vomiting"  . Hyperlipidemia   . Morbid obesity ([place])   . Obesity   . OSA on CPAP    severe with AHI 56/hr with successful CPAP titration to 18cm H2O  . PAF (paroxysmal atrial fibrillation) ([place])    a. Episode 10/2014 at time of NSTEMI, spont converted to NSR. Observation for further episodes (not on anticoag at present time due to [facility] = 1, thrombocytopenia).  . Psoriasis   . Sinus bradycardia   . Stroke [facility])    "I've been told I'd had a mini stroke on my left side" (07/05/2016)  . Thrombocytopenia ([place])    a. Onset unclear, but noted 2012. ? 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 s/p prednisone, IVIG.    Past Surgical History:  Procedure Laterality Date  . CARDIAC CATHETERIZATION    . CARDIAC CATHETERIZATION N/A 06/02/2015   Procedure: Left Heart Cath and Coronary Angiography;  Surgeon: Jettie Booze, MD;  Location: [facility] CV LAB;  Service: Cardiovascular;  Laterality: N/A;  . COLONOSCOPY WITH PROPOFOL N/A 09/26/2016   Procedure: COLONOSCOPY WITH PROPOFOL;  Surgeon: Wilford Corner, MD;  Location: [facility] ENDOSCOPY;  Service: Endoscopy;  Laterality: N/A;  . ESOPHAGOGASTRODUODENOSCOPY  N/A 06/22/2016   Procedure: ESOPHAGOGASTRODUODENOSCOPY (EGD);  Surgeon: Clarene Essex, MD;  Location: [facility] ENDOSCOPY;  Service: Endoscopy;  Laterality: N/A;  . ESOPHAGOGASTRODUODENOSCOPY N/A 11/24/2016   Procedure: ESOPHAGOGASTRODUODENOSCOPY (EGD);  Surgeon: Clarene Essex, MD;  Location: [facility]  ENDOSCOPY;  Service: Endoscopy;  Laterality: N/A;  . ESOPHAGOGASTRODUODENOSCOPY (EGD) WITH PROPOFOL N/A 08/17/2016   Procedure: ESOPHAGOGASTRODUODENOSCOPY (EGD) WITH PROPOFOL;  Surgeon: Otis Brace, MD;  Location: [facility];  Service: Gastroenterology;  Laterality: N/A;  . FACIAL COSMETIC SURGERY  1990s   "rodeo-related injury"  . IR GENERIC HISTORICAL  09/14/2016   IR RADIOLOGIST EVAL & MGMT 09/14/2016 Sandi Mariscal, MD [facility] INTERV RAD  . IR GENERIC HISTORICAL  12/15/2016   IR RADIOLOGIST EVAL & MGMT 12/15/2016 Sandi Mariscal, MD [facility] INTERV RAD  . KNEE ARTHROPLASTY Left 1970's   "put cartilage in it"  . LAPAROSCOPIC CHOLECYSTECTOMY  2007  . LEFT HEART CATHETERIZATION WITH CORONARY ANGIOGRAM N/A 11/04/2014   Procedure: LEFT HEART CATHETERIZATION WITH CORONARY ANGIOGRAM;  Surgeon: Jettie Booze, MD;  Location: [facility] CATH LAB;  Service: Cardiovascular;  Laterality: N/A;  . LEFT HEART CATHETERIZATION WITH CORONARY ANGIOGRAM N/A 01/28/2015   Procedure: LEFT HEART CATHETERIZATION WITH CORONARY ANGIOGRAM;  Surgeon: Sherren Mocha, MD;  Location: [facility] CATH LAB;  Service: Cardiovascular;  Laterality: N/A;  . liver cirrhosis    . RADIOLOGY WITH ANESTHESIA N/A 02/04/2016   Procedure: RADIOLOGY WITH ANESTHESIA;  Surgeon: Medication Radiologist, MD;  Location: [place];  Service: Radiology;  Laterality: N/A;  . TONSILLECTOMY  1960's    Allergies: Asa [aspirin]; Eliquis [apixaban]; Nsaids; Plavix [clopidogrel]; and Statins  Medications: Prior to Admission medications   Medication Sig Start Date End Date Taking? Authorizing Provider  atorvastatin (LIPITOR) 40 MG tablet Take 1 tablet (40 mg total) by mouth daily at 8 pm. Patient taking differently: Take 40 mg by mouth daily.  06/23/16   Everrett Coombe, MD  buPROPion (WELLBUTRIN XL) 150 MG 24 hr tablet Take 1 tablet (150 mg total) by mouth every other day. 12/14/16   Elsia J Yoo, DO  carvedilol (COREG) 3.125 MG tablet TAKE 1 TABLET BY MOUTH 2 TIMES A DAY WITH  A MEAL 12/13/16   Elsia Nigel Sloop, DO  eltrombopag (PROMACTA) 50 MG tablet Take 1 tablet (50 mg total) by mouth daily. Take on an empty stomach 1 hour before a meal or 2 hours after 12/01/16   Heath Lark, MD  furosemide (LASIX) 40 MG tablet 40mg  in the morning and 20mg  at night 12/14/16   Bufford Lope, DO  isosorbide mononitrate (IMDUR) 30 MG 24 hr tablet Take 1 tablet (30 mg total) by mouth daily. 12/14/16   Bufford Lope, DO  Multiple Vitamins-Minerals (MULTIVITAMIN GUMMIES ADULT) CHEW Chew 1 tablet by mouth daily. 12/01/16   Heath Lark, MD  nitroGLYCERIN (NITROSTAT) 0.4 MG SL tablet Place 1 tablet (0.4 mg total) under the tongue every 5 (five) minutes as needed for chest pain (up to 3 doses). 10/31/14   Dayna N Dunn, PA-C  oxycodone (OXY-IR) 5 MG capsule Take 1 capsule (5 mg total) by mouth every 8 (eight) hours as needed. 12/14/16   Elsia Nigel Sloop, DO  pantoprazole (PROTONIX) 40 MG tablet Take 1 tablet (40 mg total) by mouth 2 (two) times daily. 12/14/16   Bufford Lope, DO  spironolactone (ALDACTONE) 100 MG tablet Take 1 tablet (100 mg total) by mouth daily. 12/14/16   Elsia Nigel Sloop, DO  sucralfate (CARAFATE) 1 g tablet Take 1 tablet (1 g total) by mouth  4 (four) times daily -  with meals and at bedtime. Dissolve in water. Patient taking differently: Take 1 g by mouth 3 (three) times daily as needed (stomach irritation). Dissolve in water. 08/19/16   Sela Hilding, MD     Family History  Problem Relation Age of Onset  . Cirrhosis Mother   . Heart attack Mother   . Hypertension Mother   . Hypertension Father   . Stroke Father   . Arthritis Maternal Grandmother   . Heart disease Sister   . Diabetes Sister   . Cancer - Ovarian Sister   . Heart disease      Maternal/paternal grandparents  . Heart attack Maternal Aunt     Social History   Social History  . Marital status: Married    Spouse name: N/A  . Number of children: N/A  . Years of education: N/A   Social History Main Topics  . Smoking  status: Former Smoker    Packs/day: 1.50    Years: 43.00    Types: Cigarettes    Quit date: 02/19/2016  . Smokeless tobacco: Former Systems developer  . Alcohol use 0.0 oz/week     Comment: 07/05/2016 "mostly stopped in 1979; might have a beer q 6 months or so"  . Drug use: No  . Sexual activity: Not Currently   Other Topics Concern  . Not on file   Social History Narrative   Lives in [place].    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     ECOG Status: 2 - Symptomatic, <50% confined to bed  Review of Systems: A 12 point ROS discussed and pertinent positives are indicated in the HPI above.  All other systems are negative.  Review of Systems  Constitutional: Positive for activity change. Negative for chills and fever.  Respiratory: Positive for shortness of breath.        Patient reports shortness of breath which she attributes to his increased abdominal girth.  Cardiovascular: Negative for chest pain.  Gastrointestinal: Positive for abdominal distention. Negative for blood in stool, nausea and vomiting.  Skin: Negative for color change.  Psychiatric/Behavioral: Positive for confusion.    Vital Signs: BP 115/68 (BP Location: Left Arm, Patient Position: Sitting, Cuff Size: Large)   Pulse 91   Temp 98.3 F (36.8 C) (Oral)   Resp 18   Ht 6\' 3"  (1.905 m)   Wt 281 lb (127.5 kg)   SpO2 97%   BMI 35.12 kg/m   Physical Exam  Constitutional:  Patient appears older than his stated age.  Eyes: Conjunctivae are normal.  Cardiovascular: Normal rate and regular rhythm.   Pulmonary/Chest: Effort normal and breath sounds normal.  Abdominal: He exhibits distension. There is tenderness.  Patient is diffusely tender across his upper abdomen bilaterally.  Psychiatric: He has a normal mood and affect. His behavior is normal.  Nursing note and vitals reviewed.   Imaging: Dg Chest 2 View  Result Date:  11/19/2016 CLINICAL DATA:  Patient with right lower quadrant pain for 2 days. Shortness of breath. EXAM: CHEST  2 VIEW COMPARISON:  Chest radiograph 10/31/2016. FINDINGS: Stable cardiac and mediastinal contours. No consolidative pulmonary opacities. No pleural effusion or pneumothorax. Regional skeleton is unremarkable. IMPRESSION: No active cardiopulmonary disease. Electronically Signed   By: Lovey Newcomer M.D.   On: 11/19/2016 18:22   Ct Abdomen Pelvis W Contrast  Result Date: 11/19/2016 CLINICAL  DATA:  Worsening of abdominal swelling. Cirrhosis and ascites. Sudden onset of right lower quadrant pain. EXAM: CT ABDOMEN AND PELVIS WITH CONTRAST TECHNIQUE: Multidetector CT imaging of the abdomen and pelvis was performed using the standard protocol following bolus administration of intravenous contrast. CONTRAST:  143mL ISOVUE-300 IOPAMIDOL (ISOVUE-300) INJECTION 61% COMPARISON:  09/13/2016 FINDINGS: Lower chest: Normal Hepatobiliary: Advanced cirrhosis of the liver. Small liver with nodular surface and relative prominence of the left lobe an uncinate process. No focal lesions seen. Previous cholecystectomy. Pancreas: Normal Spleen: Splenomegaly consistent with portal venous hypertension. Adrenals/Urinary Tract: No adrenal mass. Renal parenchyma is normal. 7 mm stone lower pole right kidney. 4 mm stone midportion right kidney. Stomach/Bowel: No acute bowel finding. Vascular/Lymphatic: Aortic atherosclerosis. Infrarenal abdominal aortic aneurysm maximal transverse diameter 3.1 cm. IVC is normal. Extensive upper abdominal varices. Reproductive: Negative Other: Very large amount of ascites, freely distributed. Markedly increased since 2 months ago. Musculoskeletal: No acute finding. Ordinary spinal degenerative changes. IMPRESSION: Massive increase in ascites, freely distributed. No other change. Cirrhosis, splenomegaly and upper abdominal varices. Nonobstructing renal calculi on the right. 3.1 cm infrarenal abdominal  aortic aneurysm. Electronically Signed   By: Nelson Chimes M.D.   On: 11/19/2016 21:41   US Paracentesis  Result Date: 12/09/2016 INDICATION: Recurrent ascites EXAM: ULTRASOUND-GUIDED PARACENTESIS COMPARISON:  Previous paracentesis MEDICATIONS: 10 cc 1% lidocaine COMPLICATIONS: None immediate. TECHNIQUE: Informed written consent was obtained from the patient after a discussion of the risks, benefits and alternatives to treatment. A timeout was performed prior to the initiation of the procedure. Initial ultrasound scanning demonstrates a large amount of ascites within the right lower abdominal quadrant. The right lower abdomen was prepped and draped in the usual sterile fashion. 1% lidocaine with epinephrine was used for local anesthesia. Under direct ultrasound guidance, a 19 gauge, 7-cm, Yueh catheter was introduced. An ultrasound image was saved for documentation purposed. The paracentesis was performed. The catheter was removed and a dressing was applied. The patient tolerated the procedure well without immediate post procedural complication. FINDINGS: A total of approximately 7.7 liters of yellow fluid was removed. IMPRESSION: Successful ultrasound-guided paracentesis yielding 7.7 liters of peritoneal fluid. Post procedure IV Albumin: 8 gr/L per MD 64 gr ordered Read by Lavonia Drafts [facility] Electronically Signed   By: Marybelle Killings M.D.   On: 12/09/2016 15:39   US Paracentesis  Result Date: 12/02/2016 INDICATION: Patient with a history of alcoholic cirrhosis and recurrent abdominal ascites. Request is made therapeutic paracentesis and a request for albumin once he reaches 5 L removed. EXAM: ULTRASOUND GUIDED THERAPEUTIC PARACENTESIS MEDICATIONS: 1% lidocaine. COMPLICATIONS: None immediate. PROCEDURE: Informed written consent was obtained from the patient after a discussion of the risks, benefits and alternatives to treatment. A timeout was performed prior to the initiation of the procedure. Initial  ultrasound scanning demonstrates a large amount of ascites within the right lower abdominal quadrant. The right lower abdomen was prepped and draped in the usual sterile fashion. 1% lidocaine was used for local anesthesia. Following this, a 19 gauge, 7-cm, Yueh catheter was introduced. An ultrasound image was saved for documentation purposes. The paracentesis was performed. The catheter was removed and a dressing was applied. The patient tolerated the procedure well without immediate post procedural complication. FINDINGS: A total of approximately 7.7 L of yellow fluid was removed. IMPRESSION: Successful ultrasound-guided paracentesis yielding 7.7 liters of peritoneal fluid. He will receive 25 g of albumin. Read by: Saverio Danker, PA-C Electronically Signed   By: Eldridge Abrahams.D.  On: 12/02/2016 14:58   US Paracentesis  Result Date: 11/25/2016 INDICATION: Cirrhosis, CHF, recurrent ascites, prior BRTO. Request for therapeutic paracentesis. EXAM: ULTRASOUND GUIDED RIGHT LATERAL ABDOMEN PARACENTESIS MEDICATIONS: 1% Lidocaine = 10 ml. COMPLICATIONS: None immediate. PROCEDURE: Informed written consent was obtained from the patient after a discussion of the risks, benefits and alternatives to treatment. A timeout was performed prior to the initiation of the procedure. Initial ultrasound scanning demonstrates a large amount of ascites within the right lateral abdomen. The right lateral abdomen was prepped and draped in the usual sterile fashion. 1% lidocaine with epinephrine was used for local anesthesia. Following this, a 19 gauge, 10-cm, Yueh catheter was introduced. An ultrasound image was saved for documentation purposes. The paracentesis was performed. The catheter was removed and a dressing was applied. The patient tolerated the procedure well without immediate post procedural complication. FINDINGS: A total of approximately 9.1 liters of clear yellow fluid was removed. IMPRESSION: Successful ultrasound-guided  paracentesis yielding 9.1 liters of peritoneal fluid. Read by:  Gareth Eagle, PA-C Electronically Signed   By: Jerilynn Mages.  Shick M.D.   On: 11/25/2016 15:57   US Paracentesis  Result Date: 11/21/2016 INDICATION: Cirrhosis, CHF, recurrent ascites, prior BRTO. Request made for diagnostic and therapeutic paracentesis. EXAM: ULTRASOUND GUIDED DIAGNOSTIC AND THERAPEUTIC PARACENTESIS MEDICATIONS: None. COMPLICATIONS: None immediate. PROCEDURE: Informed written consent was obtained from the patient after a discussion of the risks, benefits and alternatives to treatment. A timeout was performed prior to the initiation of the procedure. Initial ultrasound scanning demonstrates a large amount of ascites within the right mid to lower abdominal quadrant. The right mid to lower abdomen was prepped and draped in the usual sterile fashion. 1% lidocaine was used for local anesthesia. Following this, a Yueh catheter was introduced. An ultrasound image was saved for documentation purposes. The paracentesis was performed. The catheter was removed and a dressing was applied. The patient tolerated the procedure well without immediate post procedural complication. FINDINGS: A total of approximately 14.4 liters of light yellow fluid was removed. Samples were sent to the laboratory as requested by the clinical team. IMPRESSION: Successful ultrasound-guided diagnostic and therapeutic paracentesis yielding 14.4 liters of peritoneal fluid. Patient will be evaluated by IR service for possible future TIPS. Read by: Rowe Robert, PA-C Electronically Signed   By: Sandi Mariscal M.D.   On: 11/21/2016 12:44   Ir Radiologist Eval & Mgmt  Result Date: 12/15/2016 Please refer to "Notes" to see consult details.  Echocardiogram performed 11/23/2016 - normal left ventricular size with normal ejection fraction of 55-60%. Normal right ventricle size and systolic function. No significant valvular abnormalities.  Labs:  CBC:  Recent Labs  11/23/16 0759  11/24/16 1003 11/25/16 0751 12/01/16 1331  WBC 2.8* 3.6* 3.6* 5.6  HGB 9.7* 11.3* 10.4* 11.4*  HCT 28.4* 33.2* 30.5* 33.7*  PLT 48* 61* 56* 90*    COAGS:  Recent Labs  02/07/16 0704  08/16/16 1459 10/26/16 1646 11/19/16 2008 11/20/16 0727  INR 1.43  < > 1.35 1.37 1.48 1.50  APTT 30  --  35  --  34 36  < > = values in this interval not displayed.  BMP:  Recent Labs  11/22/16 0816 11/23/16 0759 11/24/16 1003 11/25/16 0751  NA 132* 137 136 135  K 3.9 3.9 4.1 4.2  CL 100* 104 104 103  CO2 27 28 26 26   GLUCOSE 90 98 95 87  BUN 10 10 12 11   CALCIUM 8.2* 8.3* 8.7* 8.4*  CREATININE 1.01 1.00 1.06  1.04  GFRNONAA >60 >60 >60 >60  GFRAA >60 >60 >60 >60    LIVER FUNCTION TESTS:  Recent Labs  11/09/16 1757 11/19/16 2008 11/20/16 1320 11/21/16 0405 11/22/16 0816  BILITOT 1.8* 2.9*  --  1.4* 1.4*  AST 81* 70*  --  57* 53*  ALT 52 46  --  40 35  ALKPHOS 104 90  --  84 80  PROT 6.9 6.6  --  6.1* 6.4*  ALBUMIN 2.6* 2.5* 2.4* 2.2* 2.9*    TUMOR MARKERS: No results for input(s): AFPTM, CEA, CA199, CHROMGRNA in the last 8760 hours.  Assessment and Plan:  ZORION MOMON is a 61 y.o. male with past history significant for CAD, chronic back pain, morbid obesity, obstructive sleep apnea, paroxysmal atrial fibrillation, psoriasis, stroke, thrombocytopenia, cirrhosis and ascites who underwent a technically successful BRTO procedure on 02/05/2016 performed for life-threatening hematemesis. Post residual imaging demonstrates a technically excellent result following BRTO without recurrence of clinically significant hematemesis however unfortunately, the patient's hepatic synthetic function has deteriorated necessitating frequent large-volume paracenteses, most recently performed on 12/09/2016 yielding 7.7 L of peritoneal fluid.   Treatment options for recurrent ascites were discussed in detail with the patient including continued scheduling of ultrasound guided paracentesis and the  the importance of medical optimization.  Prolonged conversations were held with the patient and the patient's wife regarding benefits and risks (including but not limited to complications with general anesthesia, bleeding, infection, worsening mental status and heart function) of TIPS insertion. Following this prolonged and detailed conversation, the patient and the patient's wife wish to pursue all available treatment options.  Unfortunately, calculated MELD score is 14 (calculated from laboratory values obtained from late December and early January of this year while he was hospitalized), which places the patient in an intermediate category regarding expected outcomes following TIPS insertion, however these laboratory values were obtained while the patient was hospitalized and I am hopeful that the acquisition of repeat CMP level and INR will result in a more desirable MELD score.  Fortunately, echocardiogram performed 11/23/2016 demonstrated normal left ventricular size with normal ejection fraction of 55-60% and normal right ventricle size and systolic function, indicating that his cardiac function could tolerate increased demand if and when a TIPS is inserted.  Ultimately, it is important the patient is optimized from a medical standpoint in regards to control of his ascites. As such, I've encouraged the patient to keep his follow-up appointment with Dr. Watt Climes (GI) scheduled for February 1.  If Dr. Watt Climes feels the patient has reached the limits of what is attainable in regards to his recurrent ascites from a medical standpoint, then TIPS insertion will likely be pursued.  In the meantime, I will repeat a CMP and INR level to calculate a more accurate MELD score.  If / when the TIPS procedure is performed, it will be performed at [facility] and require general anesthesia. The procedure will entail an overnight admission for continued observation.  The patient the patient's wife were encouraged  to call the interventional radiology clinic with any interval questions or concerns.  A copy of this report was sent to the requesting provider on this date.  Electronically Signed: Sandi Mariscal 12/15/2016, 5:47 PM   I spent a total of 40 Minutes in face to face in clinical consultation, greater than 50% of which was counseling/coordinating care for recurrent symptomatic ascites

## 2016-12-16 ENCOUNTER — Ambulatory Visit (HOSPITAL_COMMUNITY)
Admission: RE | Admit: 2016-12-16 | Discharge: 2016-12-16 | Disposition: A | Discharge status: Home / Self Care | Payer: Medicaid Other | Source: Ambulatory Visit | Attending: Family Medicine | Admitting: Family Medicine

## 2016-12-16 ENCOUNTER — Encounter: Payer: Self-pay | Admitting: *Deleted

## 2016-12-16 ENCOUNTER — Other Ambulatory Visit: Payer: Self-pay | Admitting: *Deleted

## 2016-12-16 DIAGNOSIS — K7031 Alcoholic cirrhosis of liver with ascites: Secondary | ICD-10-CM | POA: Diagnosis present

## 2016-12-16 DIAGNOSIS — K7460 Unspecified cirrhosis of liver: Secondary | ICD-10-CM

## 2016-12-16 DIAGNOSIS — R188 Other ascites: Principal | ICD-10-CM

## 2016-12-16 MED ORDER — LIDOCAINE HCL (PF) 1 % IJ SOLN
INTRAMUSCULAR | Status: DC
Start: 2016-12-16 — End: 2016-12-17
  Filled 2016-12-16: qty 10

## 2016-12-16 NOTE — Procedures (Signed)
Ultrasound-guided therapeutic paracentesis performed yielding 8 liters of yellow colored fluid. No immediate complications.  Jaishon Krisher E 3:46 PM 12/16/2016

## 2016-12-17 LAB — COMPLETE METABOLIC PANEL WITH GFR
ALT: 40 U/L (ref 9–46)
AST: 67 U/L — AB (ref 10–35)
Albumin: 2.8 g/dL — ABNORMAL LOW (ref 3.6–5.1)
Alkaline Phosphatase: 105 U/L (ref 40–115)
BUN: 22 mg/dL (ref 7–25)
CALCIUM: 8.2 mg/dL — AB (ref 8.6–10.3)
CHLORIDE: 100 mmol/L (ref 98–110)
CO2: 22 mmol/L (ref 20–31)
CREATININE: 1.25 mg/dL (ref 0.70–1.25)
GFR, Est African American: 72 mL/min (ref >=60)
GFR, Est Non African American: 62 mL/min (ref >=60)
Glucose, Bld: 99 mg/dL (ref 65–99)
Potassium: 4.6 mmol/L (ref 3.5–5.3)
Sodium: 133 mmol/L — ABNORMAL LOW (ref 135–146)
Total Bilirubin: 3.9 mg/dL — ABNORMAL HIGH (ref 0.2–1.2)
Total Protein: 6.4 g/dL (ref 6.1–8.1)

## 2016-12-17 LAB — PROTIME-INR
INR: 1.3 — ABNORMAL HIGH
PROTHROMBIN TIME: 13.5 s — AB (ref 9.0–11.5)

## 2016-12-17 LAB — AMMONIA: AMMONIA: 69 umol/L — AB (ref <=47)

## 2016-12-19 ENCOUNTER — Ambulatory Visit: Payer: Medicaid Other | Admitting: Cardiovascular Disease

## 2016-12-20 ENCOUNTER — Telehealth: Payer: Self-pay | Admitting: Interventional Radiology

## 2016-12-20 NOTE — Telephone Encounter (Signed)
Results of recent lab work discussed with the patient and the patient's wife:  Calculated MELD score based on most recent labs was 20 (Bili - 3.9; Na - 133; INR - 1.3; Cr - 1.25).  I explained that given his elevated MELD score, I currently do NOT think he is a TIPS candidate.  I encouraged him to keep his upcoming appt with Dr. Watt Climes who may be able to offer other recommendations regarding optimizing his medical management in hopes of improving his candidacy for an eventual TIPS.  If it becomes apparent the pt is not a suitable TIPS candidate, we could consider placement of a [place] Shunt, but I would see the pt in follow-up consultation to discuss this alternative treatment option.  Pt and pt's wife demonstrated good understanding of above discussion and were encourage to call the IR clinic with questions or concerns.  Ronny Bacon, MD Pager #: [phone]

## 2016-12-23 ENCOUNTER — Ambulatory Visit (HOSPITAL_COMMUNITY)
Admission: RE | Admit: 2016-12-23 | Discharge: 2016-12-23 | Disposition: A | Discharge status: Home / Self Care | Payer: Medicaid Other | Source: Ambulatory Visit | Attending: Family Medicine | Admitting: Family Medicine

## 2016-12-23 DIAGNOSIS — K7031 Alcoholic cirrhosis of liver with ascites: Secondary | ICD-10-CM

## 2016-12-23 MED ORDER — LIDOCAINE HCL (PF) 1 % IJ SOLN
INTRAMUSCULAR | Status: AC
  Filled 2016-12-23: qty 10

## 2016-12-23 NOTE — Procedures (Signed)
Ultrasound-guided therapeutic paracentesis performed yielding 10.2 liters of yellow  fluid. No immediate complications.

## 2016-12-28 ENCOUNTER — Telehealth: Payer: Self-pay | Admitting: *Deleted

## 2016-12-28 ENCOUNTER — Other Ambulatory Visit: Payer: Self-pay | Admitting: Family Medicine

## 2016-12-28 DIAGNOSIS — K7031 Alcoholic cirrhosis of liver with ascites: Secondary | ICD-10-CM

## 2016-12-28 MED ORDER — SPIRONOLACTONE 100 MG PO TABS: 150.0000 mg | ORAL_TABLET | Freq: Every day | ORAL | 2 refills | Status: DC

## 2016-12-28 NOTE — Telephone Encounter (Addendum)
Called [facility] and spoke with Dr. Amedeo Plenty since Dr. Watt Climes is currently out of the office.  GI stated that would not recommend standing order for weekly paracentesis, is based on patient individual need, therefore should be evaluated in their clinic. [facility] will call the patient to move up the follow up appointment. Given this new recommendation, will only keep this week's paracentesis to give patient opportunity to be seen at GI follow up appointment and would also encourage patient to call and move up his follow up appointment himself to as soon as possible.   GI also recommended  increasing spironolactone to 150mg  daily. Have sent in prescription to patient's pharmacy.  Attempted to call patient with these updates but no answer, so left message stating that I had some updates and a medication change after talking with GI.   Also attempted to call radiology, no answer, so left message to ask to keep this week's appointment for paracentesis but that future paracenteses would be determined by GI.

## 2016-12-28 NOTE — Telephone Encounter (Signed)
Radiology Dept called to see if patient needed his weekly appt for paracentesis.  Please give them a call at [phone].  Derl Barrow, RN

## 2016-12-28 NOTE — Telephone Encounter (Signed)
Will forward to MD to advise. Kierah Goatley,CMA  

## 2016-12-29 NOTE — Telephone Encounter (Signed)
Spoke with Dr. Perley Jain NP Raford Pitcher today. She informed me that she spoke with the patient and rescheduled him with their office on 01/12/17. We discussed how patient ultimately needs referral to a transplant center which I have already placed but that the first steps would be for the patient to completely abstain from alcohol for at least 6 months, be on a salt restricted diet. I plan to speak with the patient more about this at his next visit. Also discussed again that GI would evaluate the patient for how frequently he would need therapeutic paracentesis going forward and that I discontinued his standing weekly order following tomorrow's appointment.

## 2016-12-30 ENCOUNTER — Ambulatory Visit (HOSPITAL_COMMUNITY)
Admission: RE | Admit: 2016-12-30 | Discharge: 2016-12-30 | Disposition: A | Discharge status: Home / Self Care | Payer: Medicaid Other | Source: Ambulatory Visit | Attending: Family Medicine | Admitting: Family Medicine

## 2016-12-30 DIAGNOSIS — K7031 Alcoholic cirrhosis of liver with ascites: Secondary | ICD-10-CM | POA: Insufficient documentation

## 2016-12-30 MED ORDER — LIDOCAINE HCL 1 % IJ SOLN
INTRAMUSCULAR | Status: AC
  Filled 2016-12-30: qty 20

## 2016-12-30 NOTE — Procedures (Signed)
PROCEDURE SUMMARY:  Successful US guided paracentesis from right lateral abdomen.  Yielded 9.5 liters of clear yellow fluid.  No immediate complications.  Pt tolerated well.   Hagan Maltz S Martise Waddell PA-C 12/30/2016 2:51 PM

## 2016-12-30 NOTE — Telephone Encounter (Signed)
Spoke patient today about increasing spironolactone and spacing out paracentesis based on GI recommendations. He is in agreement and states that has GI follow up on 01/12/17.

## 2017-01-02 ENCOUNTER — Telehealth: Payer: Self-pay | Admitting: Family Medicine

## 2017-01-02 ENCOUNTER — Other Ambulatory Visit: Payer: Self-pay | Admitting: Family Medicine

## 2017-01-02 DIAGNOSIS — K7031 Alcoholic cirrhosis of liver with ascites: Secondary | ICD-10-CM

## 2017-01-02 NOTE — Telephone Encounter (Signed)
Spoke with patient, placed referral today to [facility] transplant center.

## 2017-01-02 NOTE — Telephone Encounter (Signed)
Pt would like PCP to call him regarding Dr. Manya Silvas and approval for liver transplant. ep

## 2017-01-05 ENCOUNTER — Other Ambulatory Visit: Payer: Self-pay | Admitting: Family Medicine

## 2017-01-05 NOTE — Telephone Encounter (Signed)
Needs refill on lastulose,  [facility]

## 2017-01-06 MED ORDER — LACTULOSE 20 GM/30ML PO SOLN: 45.0000 mL | Freq: Two times a day (BID) | ORAL | 3 refills | Status: DC

## 2017-01-09 ENCOUNTER — Telehealth: Payer: Self-pay | Admitting: Family Medicine

## 2017-01-09 NOTE — Telephone Encounter (Signed)
Wife states pt is showing signs of confusion and wants to talk to Dr. [name] ASAP. ep

## 2017-01-09 NOTE — Telephone Encounter (Signed)
Wife states patient was in usual state of health yesterday. She is concerned that patient is acting confused this morning, forgetting small details. For example, patient thinks he is constipated with last BM being 3 days ago so keeps going to the bathroom this morning but wife cleaned him up from a BM that occurred 30 min ago and also had BMs yesterday. Patient is oriented to self, knows that he is at home but says they live in [place] when they actually live in [place], [place]. When I spoke with the patient he states he feels fine but just feels constipated. Wife is concerned about leaving him at home alone based on her observation of him and also notes that he appears nauseous/has been gagging. Discussed bringing patient in for same day appointment today, wife says she would prefer tomorrow since he has been worse in the past (having trouble walking) that is not happening today. Discussed going to the ED today if he gets worse. Wife says a brother will be at home with patient today and will not hesitate to take patient to ED if necessary.

## 2017-01-10 ENCOUNTER — Telehealth: Payer: Self-pay | Admitting: Family Medicine

## 2017-01-10 NOTE — Telephone Encounter (Signed)
Have you had a chance to speak to Dr. Watt Vincent Ochoa.  Patient would like to know what the next step is.  Please call, thank you/ls

## 2017-01-10 NOTE — Telephone Encounter (Signed)
Spoke with patient who states he feels well today, improved from yesterday and back to his normal self. Recalls speaking with me about his multiple BMs. Patient describes being confused over the weekend "falling over and being confused" that happened on Saturday but he slept and woke up back in his usual state of health Sunday.  Informed him that he should go to the ED if this should reoccur. Patient asking about next steps with GI, told patient referral to [facility] faxed and they should be contacting him directly to set up appointment.

## 2017-01-12 ENCOUNTER — Other Ambulatory Visit (HOSPITAL_COMMUNITY): Payer: Self-pay | Admitting: Gastroenterology

## 2017-01-12 ENCOUNTER — Other Ambulatory Visit: Payer: Self-pay | Admitting: Student in an Organized Health Care Education/Training Program

## 2017-01-12 DIAGNOSIS — R188 Other ascites: Secondary | ICD-10-CM

## 2017-01-12 DIAGNOSIS — K7031 Alcoholic cirrhosis of liver with ascites: Secondary | ICD-10-CM

## 2017-01-13 ENCOUNTER — Ambulatory Visit (HOSPITAL_COMMUNITY)
Admission: RE | Admit: 2017-01-13 | Discharge: 2017-01-13 | Disposition: A | Discharge status: Home / Self Care | Payer: Medicaid Other | Source: Ambulatory Visit | Attending: Gastroenterology | Admitting: Gastroenterology

## 2017-01-13 DIAGNOSIS — R188 Other ascites: Secondary | ICD-10-CM

## 2017-01-13 DIAGNOSIS — K7031 Alcoholic cirrhosis of liver with ascites: Secondary | ICD-10-CM | POA: Insufficient documentation

## 2017-01-13 DIAGNOSIS — R846 Abnormal cytological findings in specimens from respiratory organs and thorax: Secondary | ICD-10-CM | POA: Diagnosis not present

## 2017-01-13 LAB — ALBUMIN, PLEURAL OR PERITONEAL FLUID: Albumin, Fluid: <1 g/dL

## 2017-01-13 LAB — BODY FLUID CELL COUNT WITH DIFFERENTIAL
EOS FL: 0 %
LYMPHS FL: 26 %
MONOCYTE-MACROPHAGE-SEROUS FLUID: 51 % (ref 50–90)
Neutrophil Count, Fluid: 23 % (ref 0–25)
WBC FLUID: 158 uL (ref 0–1000)

## 2017-01-13 NOTE — Procedures (Signed)
PROCEDURE SUMMARY:  Successful US guided paracentesis from right lower abdomen.  Yielded 8.0L of clear, yellow fluid.  No immediate complications.  Pt tolerated well.   Specimen was sent for labs.  Docia Barrier PA-C 01/13/2017 12:51 PM

## 2017-01-17 ENCOUNTER — Telehealth: Payer: Self-pay | Admitting: Family Medicine

## 2017-01-17 NOTE — Telephone Encounter (Signed)
Attempted to call patient. Left voicemail advising patient that he can call the clinic back, call Dr. Watt Climes his gastroenterologist or go to the ED especially if things are urgent.

## 2017-01-17 NOTE — Telephone Encounter (Signed)
Pt called because he needs to have Paracentesis done and would like to speak to the doctor. jw

## 2017-01-18 ENCOUNTER — Telehealth: Payer: Self-pay | Admitting: Family Medicine

## 2017-01-18 DIAGNOSIS — K7031 Alcoholic cirrhosis of liver with ascites: Secondary | ICD-10-CM

## 2017-01-18 NOTE — Telephone Encounter (Signed)
Patient actually has a cirrhotic liver. It is of upmost importance that he follows a low sodium diet with less than 2g per day. I am so glad to hear how motivated the patient is about his diet, therefore I have placed a referral to work with a nutritionist.  If possible, can patient please be informed of this? Otherwise would be happy to call myself when off nights. Thank you.

## 2017-01-18 NOTE — Telephone Encounter (Signed)
Will forward to MD to advise. Airon Sahni,CMA  

## 2017-01-18 NOTE — Telephone Encounter (Signed)
Pt has a fatty liver.  He wants to know what he can eat.  Please advise

## 2017-01-19 NOTE — Telephone Encounter (Signed)
Patient is aware of this and states that he has seen Dr. Jenne Campus before and will plan to call her a call and schedule an appointment. Jazmin Hartsell,CMA

## 2017-01-20 ENCOUNTER — Other Ambulatory Visit (HOSPITAL_COMMUNITY): Payer: Self-pay | Admitting: Gastroenterology

## 2017-01-20 ENCOUNTER — Ambulatory Visit (HOSPITAL_COMMUNITY)
Admission: RE | Admit: 2017-01-20 | Discharge: 2017-01-20 | Disposition: A | Discharge status: Home / Self Care | Payer: Medicaid Other | Source: Ambulatory Visit | Attending: Gastroenterology | Admitting: Gastroenterology

## 2017-01-20 DIAGNOSIS — K625 Hemorrhage of anus and rectum: Secondary | ICD-10-CM | POA: Diagnosis not present

## 2017-01-20 DIAGNOSIS — K7031 Alcoholic cirrhosis of liver with ascites: Secondary | ICD-10-CM

## 2017-01-20 DIAGNOSIS — R188 Other ascites: Secondary | ICD-10-CM

## 2017-01-20 LAB — GRAM STAIN

## 2017-01-20 NOTE — Procedures (Signed)
PROCEDURE SUMMARY:  Successful US guided paracentesis from RLQ.  Yielded 10 L of clear yellow fluid.  No immediate complications.  Pt tolerated well.   Specimen was sent for labs.  Ascencion Dike PA-C 01/20/2017 4:27 PM

## 2017-01-25 LAB — CULTURE, BODY FLUID W GRAM STAIN -BOTTLE: Culture: NO GROWTH

## 2017-01-25 LAB — CULTURE, BODY FLUID-BOTTLE

## 2017-01-31 ENCOUNTER — Other Ambulatory Visit (HOSPITAL_COMMUNITY): Payer: Self-pay | Admitting: Gastroenterology

## 2017-01-31 DIAGNOSIS — K7031 Alcoholic cirrhosis of liver with ascites: Secondary | ICD-10-CM

## 2017-02-02 ENCOUNTER — Ambulatory Visit (HOSPITAL_COMMUNITY)
Admission: RE | Admit: 2017-02-02 | Discharge: 2017-02-02 | Disposition: A | Discharge status: Home / Self Care | Payer: Medicaid Other | Source: Ambulatory Visit | Attending: Gastroenterology | Admitting: Gastroenterology

## 2017-02-02 ENCOUNTER — Telehealth: Payer: Self-pay

## 2017-02-02 ENCOUNTER — Ambulatory Visit: Payer: Medicaid Other | Admitting: Hematology and Oncology

## 2017-02-02 ENCOUNTER — Other Ambulatory Visit: Payer: Medicaid Other

## 2017-02-02 DIAGNOSIS — K7031 Alcoholic cirrhosis of liver with ascites: Secondary | ICD-10-CM | POA: Diagnosis present

## 2017-02-02 LAB — ALBUMIN, PLEURAL OR PERITONEAL FLUID

## 2017-02-02 LAB — BODY FLUID CELL COUNT WITH DIFFERENTIAL
EOS FL: 0 %
LYMPHS FL: 32 %
Monocyte-Macrophage-Serous Fluid: 63 % (ref 50–90)
NEUTROPHIL FLUID: 5 % (ref 0–25)
Total Nucleated Cell Count, Fluid: 62 cu mm (ref 0–1000)

## 2017-02-02 MED ORDER — LIDOCAINE HCL (PF) 1 % IJ SOLN
INTRAMUSCULAR | Status: AC
  Filled 2017-02-02: qty 10

## 2017-02-02 NOTE — Telephone Encounter (Signed)
Pls make sure he stays hydrated. Ask if he wants [place] to call in some zofran for nausea; 8 mg TID prn PO, 30 tabs no refills

## 2017-02-02 NOTE — Procedures (Signed)
PROCEDURE SUMMARY:  Successful US guided paracentesis from RLQ.  Yielded 9.6 L of clear yellow fluid.  No immediate complications.  Pt tolerated well.   Specimen was sent for labs.  Ascencion Dike PA-C 02/02/2017 2:53 PM

## 2017-02-02 NOTE — Telephone Encounter (Signed)
Pt called to cancel his appt today. He started throwing up, about 6 times in the last 2 hours. He feels he cannot make it. He wants Dr Alvy Bimler to know he has an appt in [place] on tues.20th for a consultation about his liver.

## 2017-02-02 NOTE — Telephone Encounter (Signed)
Pt called to r/s for tomorrow. Told him Dr Lurlean Leyden will call with new appt.  Instructed him to keep hydrated. He states he is drinking but it is coming right back up. He states he has nausea medicine from [facility] but he cannot take it because he has taken his medicine from [facility] that he cannot take anything else for 4 hours. Instructed him to take nausea medicine as soon as possible. To call us if he needs a nausea medicine to dissolve under tongue.   He does have a paracentesis at [facility] at 1400 today.

## 2017-02-02 NOTE — Telephone Encounter (Signed)
s/w pt per Dr Alvy Bimler message. He is receptive to this. He understands the [facility] doctors can help him. Discussed taking zofan 1 hour before promacta to help prevent nausea.

## 2017-02-02 NOTE — Telephone Encounter (Signed)
I am overbooked tomorrow and next few weeks Since he has appt to see other doctors at [facility], I am sure they can recheck CBC. I will schedule him to see me back in April.

## 2017-02-04 ENCOUNTER — Telehealth: Payer: Self-pay | Admitting: Hematology and Oncology

## 2017-02-04 NOTE — Telephone Encounter (Signed)
Left message re 4/13 lab/fu

## 2017-02-06 IMAGING — CT CT ABD-PEL WO/W CM
2 of 6 series · 14 of 46 positions shown, 16 images · IV contrast (omnipaque)
Comparison: BRTO protocol CT - 02/03/2016

CLINICAL DATA: History of alcoholic cirrhosis with upper GI bleed
due to markedly enlarged gastric varices, post BRTO

EXAM:
CT ABDOMEN AND PELVIS WITHOUT AND WITH CONTRAST
TECHNIQUE: Multidetector CT imaging of the abdomen and pelvis was performed
following the standard protocol before and following the bolus
administration of intravenous contrast.
CONTRAST:  100mL OMNIPAQUE IOHEXOL 300 MG/ML  SOLN

[Series 5: venous 2mm · axial · portal-venous · 0.97mm/px · z∈[-1495,-963]mm · 11 of 308 slices shown, 13 images]
[im 21/308  soft-tissue]
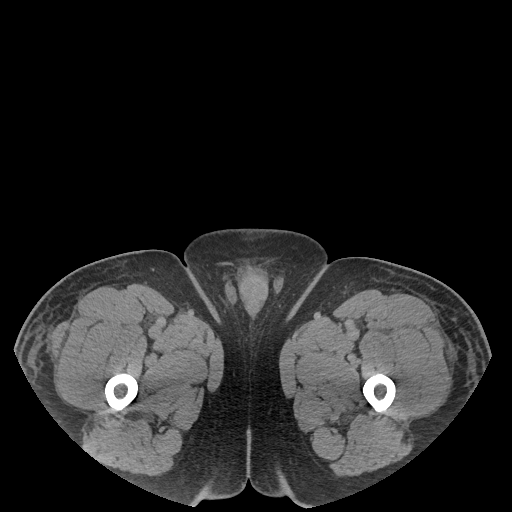
[im 21/308  bone]
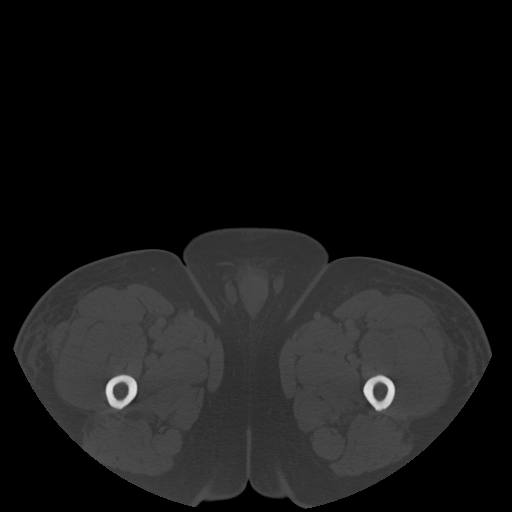
[im 41/308  soft-tissue]
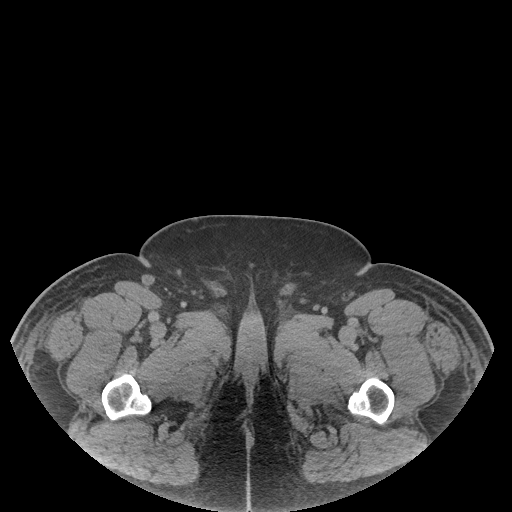
[im 82/308  soft-tissue]
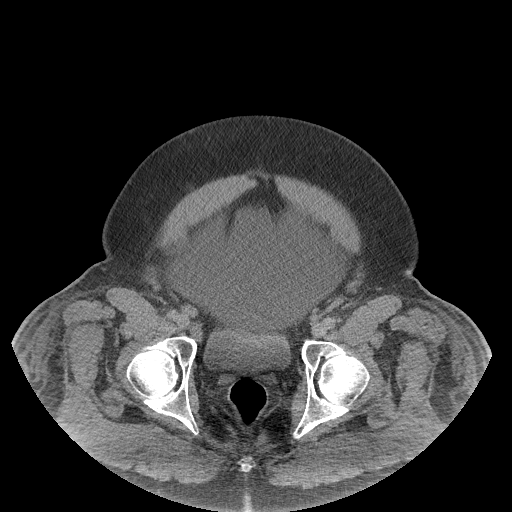
[im 103/308  soft-tissue]
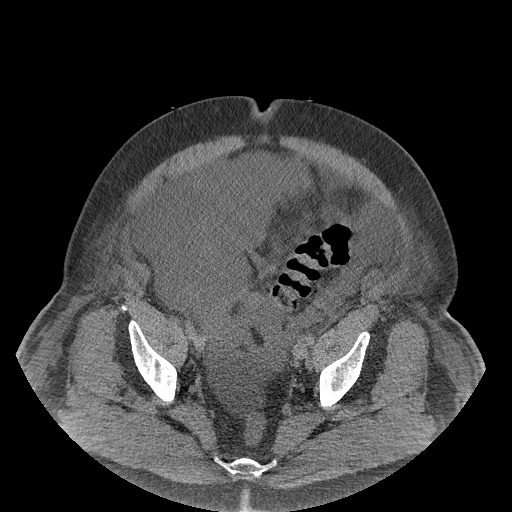
[im 123/308  soft-tissue]
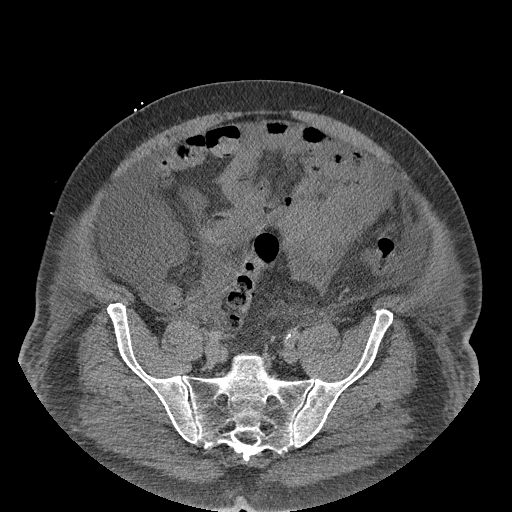
[im 164/308  soft-tissue]
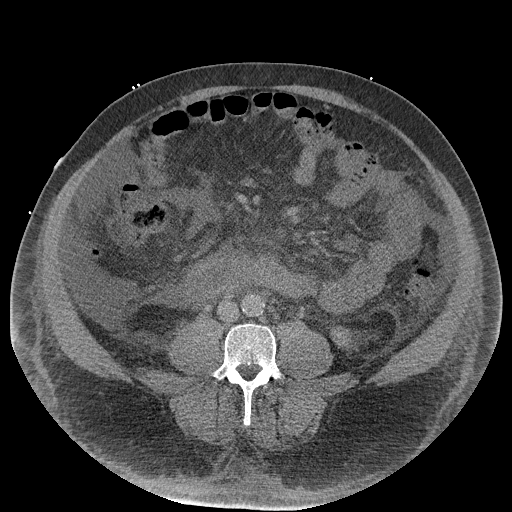
[im 185/308  soft-tissue]
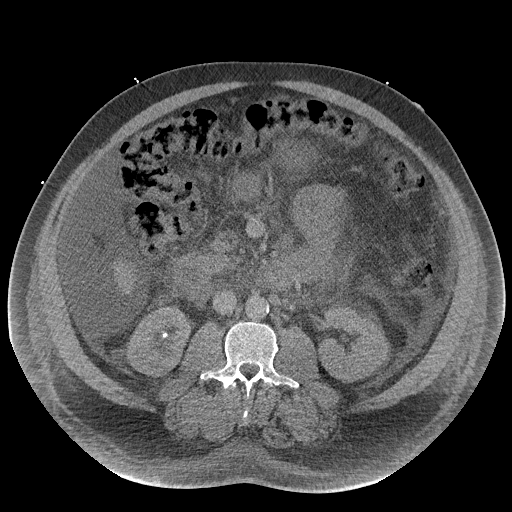
[im 205/308  soft-tissue]
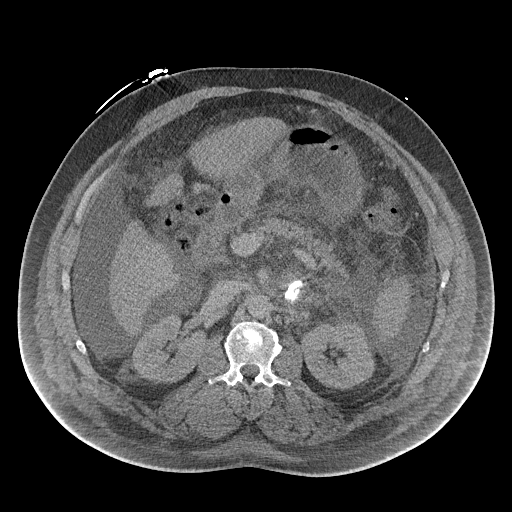
[im 226/308  soft-tissue]
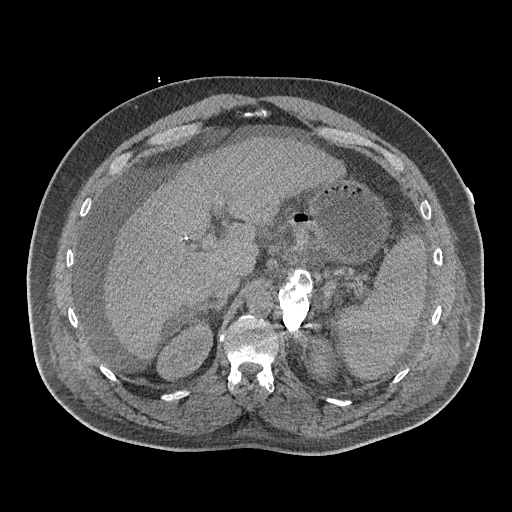
[im 226/308  bone]
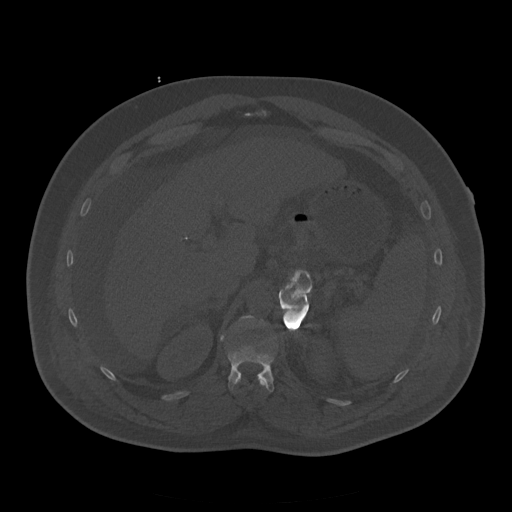
[im 267/308  soft-tissue]
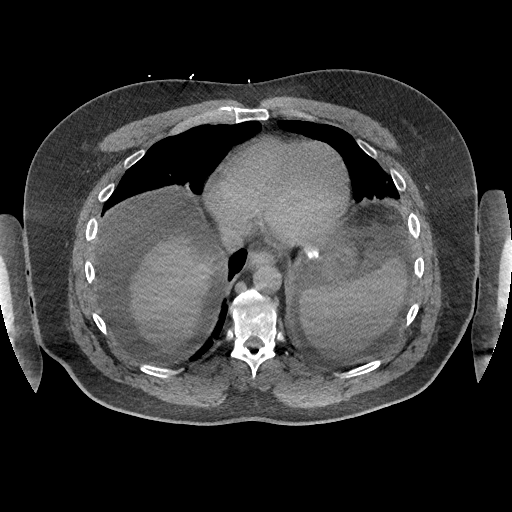
[im 287/308  soft-tissue]
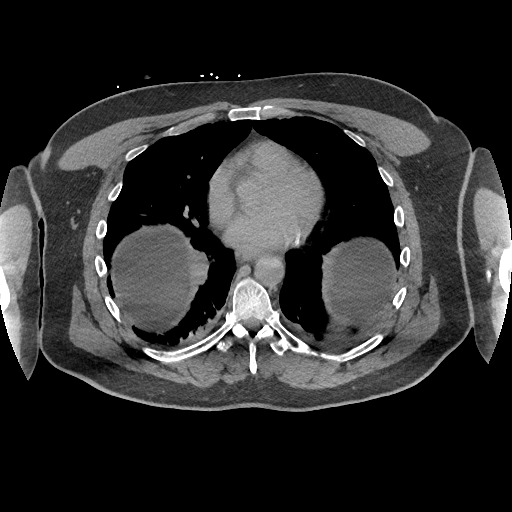

[Series 6: venous 2.0 cor · coronal · portal-venous · 1.01mm/px · 3 of 203 slices shown]
[im 68/203  soft-tissue]
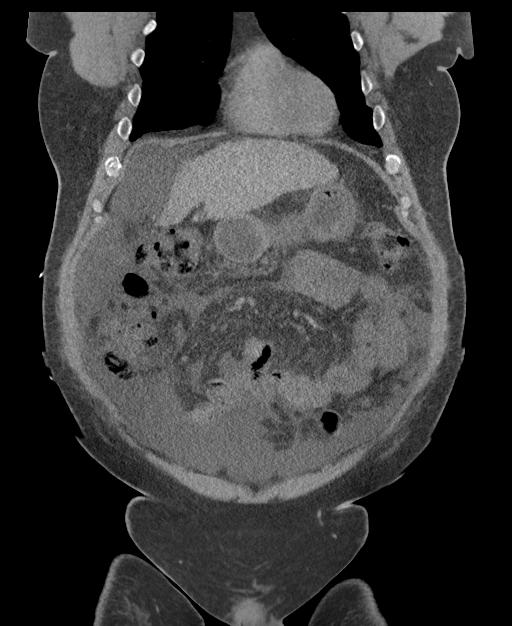
[im 90/203  soft-tissue]
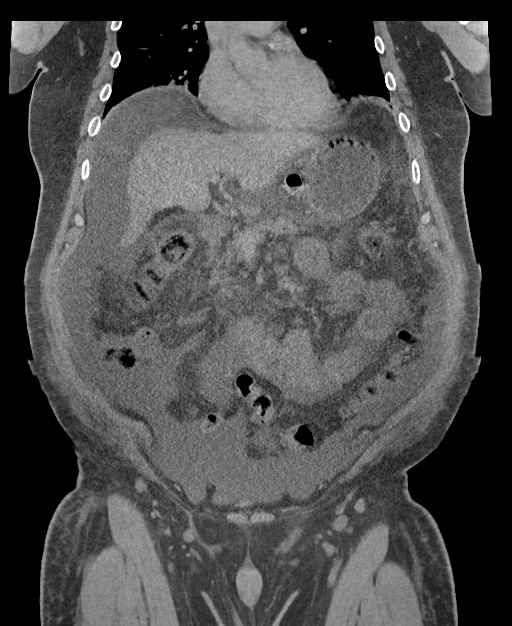
[im 113/203  soft-tissue]
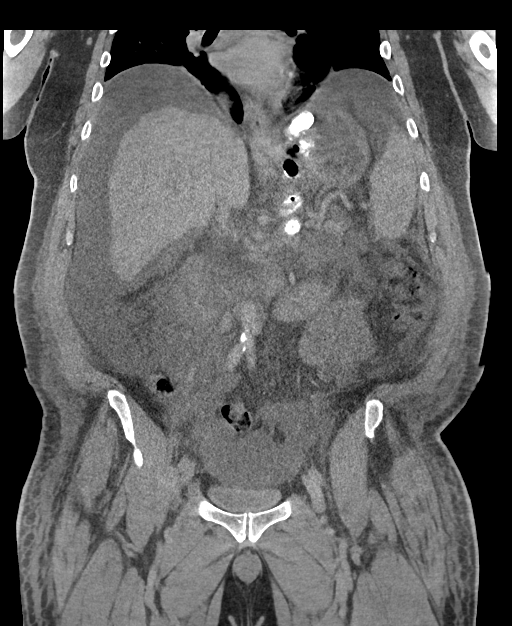

[14 of 46 positions shown; findings below may reference images not displayed]

FINDINGS: Sclerosant from BRTO procedure is seen throughout the patient's
markedly enlarged gastric varices extending to near the entrance
site of the varices at the level of the splenic vein (representative
coronal image 100, series 6). There is no extension of the
sclerosing to involve either the splenic or portal veins.

Sclerosant is seen throughout the varices to the level of the exit
site of the splenorenal shunt with minimal extension of the
sclerosant to involve the cranial aspect of the renal vein
The left renal vein appears widely patent as there is symmetric
enhancement of the left kidney in relation to the right without any
asymmetric perinephric stranding or evidence of left renal
infarction or venous congestion.

There is no evidence of sclerosant extravasation.

Re- demonstrated nodular contour of the liver compatible with
cirrhosis. No discrete hepatic lesions on this non CTA examination.
Post cholecystectomy.

Interval increase in small to moderate amount of intra-abdominal
ascites.

Punctate (approximately 0.5 cm) nonobstructing stone within the
inferior pole of the right kidney. No additional renal stones
identified. Grossly symmetric bilateral perinephric stranding. No
urinary obstruction.

Normal appearance of the bilateral adrenal glands, though note, the
left adrenal gland is suboptimally evaluated secondary to streak
artifact from sclerosis and within the patient's enlarge gastric
varices. Normal appearance of the pancreas and spleen. There is a
small amount of perisplenic fluid.

Moderate colonic stool burden without evidence of enteric
obstruction. No pneumoperitoneum, pneumatosis or portal venous gas.
Normal appearance of the terminal ileum and appendix.

Scattered atherosclerotic plaque within a normal caliber abdominal
aorta. The major branch vessels of the abdominal aorta appear patent
on this non CTA examination.

Dystrophic calcifications within a normal sized prostate gland.
Normal appearance of the urinary bladder given underdistention.

Limited visualization of the lower thorax demonstrates trace
bilateral effusions with associated dependent subsegmental
atelectasis and associated air bronchograms within the imaged
bilateral lower lobes. No new focal airspace opacities. Normal heart
size. Coronary artery calcifications. No pericardial effusion. There
is mild diffuse decreased attenuation of the intra cardiac blood
pool suggestive of anemia.

No acute or aggressive osseous abnormalities. Mild (25%) focal
concavity involving the superior endplate of the L2 vertebral body,
unchanged.

Moderate diffuse body wall anasarca, most conspicuous about the
midline of the lower back.
IMPRESSION: 1. Technically successful BRT0 procedure with sclerosant seen
throughout the gastric varices. There is no extension of the
sclerosis since into either the splenic or portal veins. There is
minimal extension of the sclerosant to involve the cranial aspect of
the left renal vein, however this does not result in altered
hemodynamics of the left kidney.
2. Stable stigmata of cirrhosis without discrete hepatic lesions on
this non CTA examination.
3. Interval increase in now small to moderate amount of
intra-abdominal ascites, an expected finding post BRTO.
4. Incidentally noted solitary punctate (approximately 5 mm)
nonobstructing right-sided renal stone.

PLAN:
The patient was seen in [REDACTED] in 1 month
time with follow-up post BRT0 protocol CT scan and CMP level.

## 2017-02-06 NOTE — Telephone Encounter (Signed)
April schedule mailed

## 2017-02-09 ENCOUNTER — Other Ambulatory Visit (HOSPITAL_COMMUNITY): Payer: Self-pay | Admitting: Gastroenterology

## 2017-02-09 ENCOUNTER — Ambulatory Visit (HOSPITAL_COMMUNITY): Payer: Medicaid Other

## 2017-02-09 ENCOUNTER — Ambulatory Visit (HOSPITAL_COMMUNITY)
Admission: RE | Admit: 2017-02-09 | Discharge: 2017-02-09 | Disposition: A | Discharge status: Home / Self Care | Payer: Medicaid Other | Source: Ambulatory Visit | Attending: Gastroenterology | Admitting: Gastroenterology

## 2017-02-09 ENCOUNTER — Encounter (HOSPITAL_COMMUNITY): Payer: Self-pay | Admitting: General Surgery

## 2017-02-09 DIAGNOSIS — R188 Other ascites: Secondary | ICD-10-CM | POA: Insufficient documentation

## 2017-02-09 DIAGNOSIS — K7031 Alcoholic cirrhosis of liver with ascites: Secondary | ICD-10-CM

## 2017-02-09 DIAGNOSIS — K7460 Unspecified cirrhosis of liver: Secondary | ICD-10-CM | POA: Diagnosis present

## 2017-02-09 HISTORY — PX: IR GENERIC HISTORICAL: IMG1180011

## 2017-02-09 LAB — ALBUMIN, PLEURAL OR PERITONEAL FLUID

## 2017-02-09 LAB — BODY FLUID CELL COUNT WITH DIFFERENTIAL
EOS FL: 0 %
Lymphs, Fluid: 26 %
MONOCYTE-MACROPHAGE-SEROUS FLUID: 56 % (ref 50–90)
Neutrophil Count, Fluid: 18 % (ref 0–25)
WBC FLUID: 83 uL (ref 0–1000)

## 2017-02-09 MED ORDER — LIDOCAINE HCL (PF) 1 % IJ SOLN
INTRAMUSCULAR | Status: DC | PRN
  Administered 2017-02-09: 10 mL

## 2017-02-09 MED ORDER — LIDOCAINE HCL (PF) 1 % IJ SOLN
INTRAMUSCULAR | Status: AC
  Filled 2017-02-09: qty 30

## 2017-02-09 NOTE — Procedures (Signed)
Ultrasound-guided diagnostic and therapeutic paracentesis performed yielding 12 liters of yellow colored fluid. Patient was given a maximum of 10L, but due to a misunderstanding, a total of 12L was removed.  The patient tolerated the procedure well and had no blood pressure issues.    Vincent Ochoa E 3:14 PM 02/09/2017

## 2017-02-13 ENCOUNTER — Ambulatory Visit: Payer: Medicaid Other | Admitting: Dietician

## 2017-02-16 ENCOUNTER — Ambulatory Visit (HOSPITAL_COMMUNITY): Payer: Medicaid Other

## 2017-02-17 ENCOUNTER — Ambulatory Visit (HOSPITAL_COMMUNITY): Admission: RE | Admit: 2017-02-17 | Payer: Medicaid Other | Source: Ambulatory Visit

## 2017-02-17 ENCOUNTER — Telehealth: Payer: Self-pay | Admitting: Cardiovascular Disease

## 2017-02-17 NOTE — Telephone Encounter (Signed)
Pt gave verbal permission to speak with wife.  Pt recently hospitalized at [facility] d/t increased confusion and elevated ammonia levels.  At discharge today, pt's paperwork said to d/c Imdur and Coreg and decrease Lasix to 20mg  QD.  Wife unsure why these medications were stopped.  Per chart in Care Everywhere:  Last Assessment & Plan:  BPs were low during recent admission, so imdur, lasix, and spironolactone were held. BP 128/55 today, so will continue with just coreg and PRN nitroglycerin. F/u 32months   Wife states her paperwork is showing d/c Coreg although plan above mentions continuing it.  Wife concerned about pt being off of these medications and would like to make Dr. Burt Knack aware and see if he feels pt needs to be back on these meds.  Will route to Dr. Burt Knack for review and advisement.

## 2017-02-17 NOTE — Telephone Encounter (Signed)
New Message  Pt at [facility] ED last week, took pt off imdur and carvedilol, md cooper put pt on this. pt wants someone to give her a call.

## 2017-02-19 NOTE — Telephone Encounter (Signed)
Tried to review records but I can't find DC summary or hospital records. Hard for me to advise without the records. Would stay off medications for now as BP was low. Could continue to monitor BP about 3 days per week and call in with readings. Can then better advise about carvedilol.

## 2017-02-20 NOTE — Telephone Encounter (Signed)
I spoke with the pt's wife and made her aware of Dr Antionette Char recommendation. The pt has been set up with home health and the first visit was today.  The pt's BP was 122/72. I advised that they follow instructions per discharge instructions.  The pt has a pending appointment with Dr Burt Knack on 03/01/17 and they will bring list of BP readings into the office.

## 2017-02-21 ENCOUNTER — Ambulatory Visit (HOSPITAL_COMMUNITY)
Admission: RE | Admit: 2017-02-21 | Discharge: 2017-02-21 | Disposition: A | Discharge status: Home / Self Care | Payer: Medicaid Other | Source: Ambulatory Visit | Attending: Gastroenterology | Admitting: Gastroenterology

## 2017-02-21 DIAGNOSIS — K7031 Alcoholic cirrhosis of liver with ascites: Secondary | ICD-10-CM | POA: Insufficient documentation

## 2017-02-21 LAB — BODY FLUID CELL COUNT WITH DIFFERENTIAL
LYMPHS FL: 22 %
Monocyte-Macrophage-Serous Fluid: 71 % (ref 50–90)
NEUTROPHIL FLUID: 7 % (ref 0–25)
Total Nucleated Cell Count, Fluid: 36 cu mm (ref 0–1000)

## 2017-02-21 LAB — ALBUMIN, PLEURAL OR PERITONEAL FLUID

## 2017-02-21 MED ORDER — LIDOCAINE HCL 1 % IJ SOLN
INTRAMUSCULAR | Status: AC
  Filled 2017-02-21: qty 20

## 2017-02-21 NOTE — Procedures (Signed)
US guided paracentesis. Removed 10 liters of yellow fluid.  No immediate complication.  Minimal blood loss.

## 2017-02-23 ENCOUNTER — Ambulatory Visit (HOSPITAL_COMMUNITY): Payer: Medicaid Other

## 2017-03-01 ENCOUNTER — Ambulatory Visit: Payer: Medicaid Other | Admitting: Cardiovascular Disease

## 2017-03-02 ENCOUNTER — Ambulatory Visit (HOSPITAL_COMMUNITY): Payer: Medicaid Other

## 2017-03-03 ENCOUNTER — Ambulatory Visit: Payer: Medicaid Other | Admitting: Hematology and Oncology

## 2017-03-03 ENCOUNTER — Other Ambulatory Visit: Payer: Medicaid Other

## 2017-03-08 ENCOUNTER — Ambulatory Visit (HOSPITAL_COMMUNITY)
Admission: RE | Admit: 2017-03-08 | Discharge: 2017-03-08 | Disposition: A | Discharge status: Home / Self Care | Payer: Self-pay | Source: Ambulatory Visit | Attending: Gastroenterology | Admitting: Gastroenterology

## 2017-03-08 ENCOUNTER — Encounter (HOSPITAL_COMMUNITY): Payer: Self-pay | Admitting: Student

## 2017-03-08 ENCOUNTER — Other Ambulatory Visit (HOSPITAL_COMMUNITY): Payer: Self-pay | Admitting: Gastroenterology

## 2017-03-08 ENCOUNTER — Ambulatory Visit (HOSPITAL_COMMUNITY): Admission: RE | Admit: 2017-03-08 | Payer: Medicaid Other | Source: Ambulatory Visit

## 2017-03-08 DIAGNOSIS — K7031 Alcoholic cirrhosis of liver with ascites: Secondary | ICD-10-CM

## 2017-03-08 DIAGNOSIS — R188 Other ascites: Secondary | ICD-10-CM | POA: Insufficient documentation

## 2017-03-08 HISTORY — PX: IR PARACENTESIS: IMG2679

## 2017-03-08 LAB — BODY FLUID CELL COUNT WITH DIFFERENTIAL
Eos, Fluid: 0 %
Lymphs, Fluid: 42 %
MONOCYTE-MACROPHAGE-SEROUS FLUID: 53 % (ref 50–90)
Neutrophil Count, Fluid: 5 % (ref 0–25)
Total Nucleated Cell Count, Fluid: 116 cu mm (ref 0–1000)

## 2017-03-08 MED ORDER — LIDOCAINE HCL 1 % IJ SOLN
INTRAMUSCULAR | Status: AC
  Filled 2017-03-08: qty 20

## 2017-03-08 MED ORDER — ALBUMIN HUMAN 25 % IV SOLN
50.0000 g | Freq: Once | INTRAVENOUS | Status: AC
  Administered 2017-03-08: 50 g via INTRAVENOUS
  Filled 2017-03-08: qty 200

## 2017-03-08 MED ORDER — LIDOCAINE HCL (PF) 1 % IJ SOLN
INTRAMUSCULAR | Status: DC | PRN
  Administered 2017-03-08: 10 mL via INTRADERMAL

## 2017-03-08 NOTE — Procedures (Signed)
PROCEDURE SUMMARY:  Successful US guided paracentesis from left lateral abdomen.  Yielded 7.0L of clear, yellow fluid.  No immediate complications.  Pt tolerated well.   Specimen was sent for labs.  Docia Barrier PA-C 03/08/2017 1:33 PM

## 2017-03-09 ENCOUNTER — Ambulatory Visit (HOSPITAL_COMMUNITY): Payer: Medicaid Other

## 2017-03-14 ENCOUNTER — Ambulatory Visit (HOSPITAL_COMMUNITY)
Admission: RE | Admit: 2017-03-14 | Discharge: 2017-03-14 | Disposition: A | Discharge status: Home / Self Care | Payer: Self-pay | Source: Ambulatory Visit | Attending: Gastroenterology | Admitting: Gastroenterology

## 2017-03-14 ENCOUNTER — Telehealth: Payer: Self-pay | Admitting: Cardiovascular Disease

## 2017-03-14 ENCOUNTER — Encounter (HOSPITAL_COMMUNITY): Payer: Self-pay | Admitting: General Surgery

## 2017-03-14 DIAGNOSIS — R188 Other ascites: Secondary | ICD-10-CM | POA: Insufficient documentation

## 2017-03-14 DIAGNOSIS — K7031 Alcoholic cirrhosis of liver with ascites: Secondary | ICD-10-CM

## 2017-03-14 DIAGNOSIS — K7460 Unspecified cirrhosis of liver: Secondary | ICD-10-CM | POA: Insufficient documentation

## 2017-03-14 HISTORY — PX: IR PARACENTESIS: IMG2679

## 2017-03-14 LAB — BODY FLUID CELL COUNT WITH DIFFERENTIAL
LYMPHS FL: 37 %
Monocyte-Macrophage-Serous Fluid: 59 % (ref 50–90)
Neutrophil Count, Fluid: 4 % (ref 0–25)
Total Nucleated Cell Count, Fluid: 76 cu mm (ref 0–1000)

## 2017-03-14 LAB — ALBUMIN, PLEURAL OR PERITONEAL FLUID

## 2017-03-14 MED ORDER — LIDOCAINE HCL 1 % IJ SOLN
INTRAMUSCULAR | Status: AC
  Filled 2017-03-14: qty 20

## 2017-03-14 NOTE — Telephone Encounter (Signed)
I spoke with the pt's wife and she said the pt had to cancel his 4/11 appointment with Dr Burt Knack due to being admitted to [facility] and then hospitalized at [facility].  She states the pt has had 2 episodes of chest pain recently that have required NTG.  Yesterday the pt was lying down when he developed left arm pain and CP.  The pt took 1 NTG and had relief of symptoms. Saturday night the pt had arm tingling but no chest pain.  Per the pt's wife the pt remains off of Imdur and Coreg from previous hospitalizations. In reviewing the pt's chart we have not seen the pt since 2016 and he has had multiple hospitalizations and is in the process of undergoing evaluation at [facility] for liver transplant. I scheduled the pt to see Dr Burt Knack on 03/16/17.  I advised that the pt proceed to ER if he develops any further symptoms at rest and are not relieved by NTG. Pt's wife agreed with plan.

## 2017-03-14 NOTE — Telephone Encounter (Signed)
New message      Pt c/o of Chest Pain: STAT if CP now or developed within 24 hours  1. Are you having CP right now? no 2. Are you experiencing any other symptoms (ex. SOB, nausea, vomiting, sweating)?  Arm pain  3. How long have you been experiencing CP?  Last episode was yesterday.  Pt had about 3 episodes over the last few weeks 4. Is your CP continuous or coming and going?  Not sure 5. Have you taken Nitroglycerin?  yes ?

## 2017-03-14 NOTE — Procedures (Signed)
Ultrasound-guided diagnostic and therapeutic paracentesis performed yielding 10 liters of yellow colored fluid. No immediate complications.  Vincent Ochoa E 10:59 AM 03/14/2017

## 2017-03-14 NOTE — Telephone Encounter (Signed)
Agree. thanks

## 2017-03-16 ENCOUNTER — Ambulatory Visit (INDEPENDENT_AMBULATORY_CARE_PROVIDER_SITE_OTHER): Payer: Self-pay | Admitting: Cardiovascular Disease

## 2017-03-16 ENCOUNTER — Encounter: Payer: Self-pay | Admitting: Cardiovascular Disease

## 2017-03-16 ENCOUNTER — Ambulatory Visit (HOSPITAL_COMMUNITY): Payer: Medicaid Other

## 2017-03-16 VITALS — BP 110/58 | HR 92 | Ht 75.5 in | Wt 243.4 lb

## 2017-03-16 DIAGNOSIS — I25119 Atherosclerotic heart disease of native coronary artery with unspecified angina pectoris: Secondary | ICD-10-CM

## 2017-03-16 MED ORDER — NITROGLYCERIN 0.4 MG SL SUBL: 0.4000 mg | SUBLINGUAL_TABLET | SUBLINGUAL | 3 refills | Status: DC | PRN

## 2017-03-16 NOTE — Patient Instructions (Signed)
Medication Instructions:  Your physician recommends that you continue on your current medications as directed. Please refer to the Current Medication list given to you today.  Please continue to use Nitroglycerin as needed for Chest Pain.  If Chest Pain is not relived after 2 Nitroglycerin please call 911.   Labwork: No new orders.   Testing/Procedures: No new orders.   Follow-Up: Your physician wants you to follow-up in: 1 YEAR with Dr Burt Knack.  You will receive a reminder letter in the mail two months in advance. If you don't receive a letter, please call our office to schedule the follow-up appointment.   Any Other Special Instructions Will Be Listed Below (If Applicable).     If you need a refill on your cardiac medications before your next appointment, please call your pharmacy.

## 2017-03-16 NOTE — Progress Notes (Signed)
Cardiology Office Note Date:  03/16/2017   ID:  Vincent Ochoa, DOB 25-Nov-1955, MRN [id number]  PCP:  Bufford Lope, DO  Cardiologist:  Sherren Mocha, MD    Chief Complaint  Patient presents with  . Chest Pain    Sunday. Sharp. relieved by 1 dose of nitro     History of Present Illness: Vincent Ochoa is a 61 y.o. male who presents for follow-up of CAD. He hasn't been seen in about 2 years. Previously underwent stenting of the left circumflex with a bare-metal stent. Last cath in 2016 showed patency of the stent and no other obstructive CAD. He has developed cirrhosis and is being considered for liver transplant. 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 He no longer takes any antiplatelet drugs or anti-anginals because of bleeding risk and hypotension, respectively. He had an echo and nuclear stress test one month ago with findings as outlined below.   He is here with his wife today. Has had 3 or 4 episdoes of chest pain associated with emotional stress. Denies exertional chest pain, but feels exhausted with activity and complains of generalized weakness, dizziness, and shortness of breath. Chest pain episodes have been responsive to one SL NTG.    Past Medical History:  Diagnosis Date  . Arthritis    "hands, knees, back" (07/05/2016)  . Back pain, lumbosacral 2014  . CHF (congestive heart failure) ([place])   . Cirrhosis ([place])   . Coronary artery disease    a. Evaluated by Dr. Stanford Breed 2012 - nonobstructive 2007-2008 by cath. b. Abnormal stress test 10/2014 but cath deferred temporarily due to thrombocytopenia. c. 10/2014: readm with AF-RVR/NSTEMI - s/p BMS to mLCx 11/04/14 (mod dz in RCA);  d. relook cath 01/2015 and 06/02/2015 patent stent, nonobs CAD, EF 55-65%.  . Elevated LFTs   . Facial cellulitis 2014   Periorbital  . Fatty liver US - 2012  . Frequent headaches   . GERD  (gastroesophageal reflux disease)   . History of blood transfusion 01/2016   "lost alot of blood vomiting"  . Hyperlipidemia   . Morbid obesity ([place])   . Obesity   . OSA on CPAP    severe with AHI 56/hr with successful CPAP titration to 18cm H2O  . PAF (paroxysmal atrial fibrillation) ([place])    a. Episode 10/2014 at time of NSTEMI, spont converted to NSR. Observation for further episodes (not on anticoag at present time due to [facility] = 1, thrombocytopenia).  . Psoriasis   . Sinus bradycardia   . Stroke [facility])    "I've been told I'd had a mini stroke on my left side" (07/05/2016)  . Thrombocytopenia ([place])    a. Onset unclear, but noted 2012. ? 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 s/p prednisone, IVIG.    Past Surgical History:  Procedure Laterality Date  . CARDIAC CATHETERIZATION    . CARDIAC CATHETERIZATION N/A 06/02/2015   Procedure: Left Heart Cath and Coronary Angiography;  Surgeon: Jettie Booze, MD;  Location: [facility] CV LAB;  Service: Cardiovascular;  Laterality: N/A;  . COLONOSCOPY WITH PROPOFOL N/A 09/26/2016   Procedure: COLONOSCOPY WITH PROPOFOL;  Surgeon: Wilford Corner, MD;  Location: [facility] ENDOSCOPY;  Service: Endoscopy;  Laterality: N/A;  . ESOPHAGOGASTRODUODENOSCOPY N/A 06/22/2016   Procedure: ESOPHAGOGASTRODUODENOSCOPY (EGD);  Surgeon: Clarene Essex, MD;  Location: [facility] ENDOSCOPY;  Service: Endoscopy;  Laterality: N/A;  . ESOPHAGOGASTRODUODENOSCOPY  N/A 11/24/2016   Procedure: ESOPHAGOGASTRODUODENOSCOPY (EGD);  Surgeon: Clarene Essex, MD;  Location: [facility] ENDOSCOPY;  Service: Endoscopy;  Laterality: N/A;  . ESOPHAGOGASTRODUODENOSCOPY (EGD) WITH PROPOFOL N/A 08/17/2016   Procedure: ESOPHAGOGASTRODUODENOSCOPY (EGD) WITH PROPOFOL;  Surgeon: Otis Brace, MD;  Location: [facility];  Service: Gastroenterology;  Laterality: N/A;  . FACIAL COSMETIC SURGERY  1990s   "rodeo-related injury"  . IR GENERIC HISTORICAL  09/14/2016   IR RADIOLOGIST  EVAL & MGMT 09/14/2016 Sandi Mariscal, MD [facility] INTERV RAD  . IR GENERIC HISTORICAL  12/15/2016   IR RADIOLOGIST EVAL & MGMT 12/15/2016 Sandi Mariscal, MD [facility] INTERV RAD  . IR GENERIC HISTORICAL  02/09/2017   IR PARACENTESIS 02/09/2017 Saverio Danker, PA-C [facility]  . IR PARACENTESIS  03/08/2017  . IR PARACENTESIS  03/14/2017  . KNEE ARTHROPLASTY Left 1970's   "put cartilage in it"  . LAPAROSCOPIC CHOLECYSTECTOMY  2007  . LEFT HEART CATHETERIZATION WITH CORONARY ANGIOGRAM N/A 11/04/2014   Procedure: LEFT HEART CATHETERIZATION WITH CORONARY ANGIOGRAM;  Surgeon: Jettie Booze, MD;  Location: [facility] CATH LAB;  Service: Cardiovascular;  Laterality: N/A;  . LEFT HEART CATHETERIZATION WITH CORONARY ANGIOGRAM N/A 01/28/2015   Procedure: LEFT HEART CATHETERIZATION WITH CORONARY ANGIOGRAM;  Surgeon: Sherren Mocha, MD;  Location: [facility] CATH LAB;  Service: Cardiovascular;  Laterality: N/A;  . liver cirrhosis    . RADIOLOGY WITH ANESTHESIA N/A 02/04/2016   Procedure: RADIOLOGY WITH ANESTHESIA;  Surgeon: Medication Radiologist, MD;  Location: [facility];  Service: Radiology;  Laterality: N/A;  . TONSILLECTOMY  1960's    Current Outpatient Prescriptions  Medication Sig Dispense Refill  . atorvastatin (LIPITOR) 40 MG tablet TAKE ONE (1) TABLET EACH DAY AT 8PM 30 tablet 3  . buPROPion (WELLBUTRIN XL) 150 MG 24 hr tablet Take 1 tablet (150 mg total) by mouth every other day. 30 tablet 2  . ciprofloxacin (CIPRO) 500 MG tablet Take 500 mg by mouth daily.    Marland Kitchen eltrombopag (PROMACTA) 50 MG tablet Take 1 tablet (50 mg total) by mouth daily. Take on an empty stomach 1 hour before a meal or 2 hours after 30 tablet 11  . Lactulose 20 GM/30ML SOLN Take 45 mLs (30 g total) by mouth 2 (two) times daily. 473 mL 3  . Multiple Vitamins-Minerals (MULTIVITAMIN GUMMIES ADULT) CHEW Chew 1 tablet by mouth daily. 30 tablet 11  . nitroGLYCERIN (NITROSTAT) 0.4 MG SL tablet Place 1 tablet (0.4 mg total) under the tongue every 5 (five)  minutes as needed for chest pain (up to 3 doses). 25 tablet 3  . pantoprazole (PROTONIX) 40 MG tablet Take 1 tablet (40 mg total) by mouth 2 (two) times daily. 60 tablet 2  . sucralfate (CARAFATE) 1 g tablet Take 1 g by mouth 3 (three) times daily as needed (stomach irritation).     No current facility-administered medications for this visit.     Allergies:   Asa [aspirin]; Eliquis [apixaban]; Nsaids; Plavix [clopidogrel]; Statins; and Tolmetin   Social History:  The patient  reports that he quit smoking about 12 months ago. His smoking use included Cigarettes. He has a 64.50 pack-year smoking history. He has quit using smokeless tobacco. He reports that he drinks alcohol. He reports that he does not use drugs.   Family History:  The patient's  family history includes Arthritis in his maternal grandmother; Cirrhosis in his mother; Diabetes in his sister; Heart attack in his maternal aunt and mother; Heart disease in his other and sister; Hypertension in his father and mother;  Ovarian cancer in his sister; Stroke in his father.    ROS:  Please see the history of present illness.  Otherwise, review of systems is positive for decreased appetitie, chest pain, DOE, orthopnea, abdominal pain, depression, dizziness, easy bruising, fatigue, sweating, fever, snoring, wheezing, nausea, anxiety, balance problems. .  All other systems are reviewed and negative.    PHYSICAL EXAM: VS:  BP (!) 110/58   Pulse 92   Ht 6' 3.5" (1.918 m)   Wt 243 lb 6.4 oz (110.4 kg)   BMI 30.02 kg/m  , BMI Body mass index is 30.02 kg/m. GEN: obese, chronically ill-appearing male, in no acute distress  HEENT: normal  Neck: no JVD, no masses. No carotid bruits Cardiac: RRR without murmur or gallop                Respiratory:  clear to auscultation bilaterally, normal work of breathing GI: soft, distended, diffuse tenderness with no rebound or guarding MS: no deformity or atrophy  Ext: no pretibial edema, pedal pulses 2+=  bilaterally Skin: warm and dry, no rash Neuro:  Strength and sensation are intact Psych: euthymic mood, full affect  EKG:  EKG is ordered today. The ekg ordered today shows NSR 93 bpm, nonspecific T wave abnormality  Recent Labs: 12/01/2016: HGB 11.4; Platelets 90 12/16/2016: ALT 40; BUN 22; Creat 1.25; Potassium 4.6; Sodium 133   Lipid Panel     Component Value Date/Time   CHOL 157 06/02/2015 0635   TRIG 83 02/08/2016 0126   HDL 27 (L) 06/02/2015 0635   CHOLHDL 5.8 06/02/2015 0635   VLDL 15 06/02/2015 0635   LDLCALC 115 (H) 06/02/2015 0635      Wt Readings from Last 3 Encounters:  03/16/17 243 lb 6.4 oz (110.4 kg)  12/15/16 281 lb (127.5 kg)  12/14/16 281 lb (127.5 kg)     Cardiac Studies Reviewed: 2D Echo 02/07/17:  Normal left ventricular systolic function, ejection fraction 65 to 70%  Mitral valve prolapse - mild  Mitral regurgitation - mild  Dilated left atrium - mild  Normal right ventricular systolic function  Intrapulmonary shunt (large - Grade III)  Myocardial perfusion scan 02-07-17: Impressions: - Normal myocardial perfusion study - No evidence for significant ischemia or scar is noted. - Post stress: Global systolic function is normal. The ejection fraction  was greater than 65%. - Coronary calcifications and post-PCI findings are noted - Mitral annular calcifications are noted - Incidentally noted on the attenuation CT scan are large volume ascites.  Cardiac Cath 06-02-2015: Coronary Findings   Dominance: Co-dominant  Left Main  LM lesion, 20% stenosed.  Left Anterior Descending  Mid LAD-1 lesion, 25% stenosed.  Mid LAD-2 lesion, 20% stenosed.  First Diagonal Branch  The vessel is small in size.  Second Diagonal Branch  The vessel is small in size.  Ramus Intermedius  Ramus lesion, 25% stenosed.  Left Circumflex  Mid Cx lesion, 25% stenosed. The lesion was previously treated with a bare metal stent six to twelve months ago.  Right  Coronary Artery  Mid RCA lesion, 30% stenosed.  Wall Motion              Left Heart   Left Ventricle The left ventricular size is normal. The left ventricular systolic function is normal. The left ventricular ejection fraction is 55-65% by visual estimate. There are no wall motion abnormalities in the left ventricle.    Aortic Valve There is no aortic valve stenosis.    Coronary Diagrams  Diagnostic Diagram         ASSESSMENT AND PLAN: 1.  Coronary artery disease, native vessel, with angina: Reviewed the patient's recent myocardial perfusion scan which shows no ischemic territory. Our treatment options are extremely limited because of his inability to take antiplatelet therapy. In addition, with his chronic hypotension, he cannot tolerate antianginal medicines either. He is having occasional chest discomfort responsive to sublingual nitroglycerin. I think it is best for him to continue with this approach to just use nitroglycerin as needed. He had single-vessel coronary artery disease previously with a follow-up heart catheterization demonstrating continued patency of his left circumflex stent. I think his overall risk is low and it is appropriate to continue with observation.  2. Chronic diastolic heart failure: The patient has clinical evidence of volume overload but this is primarily related to his advanced liver disease. He will continue to undergo evaluation for liver transplant. He has intermittent paracentesis performed.  3. Hyperlipidemia: treated with atorvastatin.   4. HTN: no longer an issue. Now with hypotension, recent hospitalization with hepatorenal syndrome. Off of all cardiac medications.   Current medicines are reviewed with the patient today.  The patient does not have concerns regarding medicines.  Labs/ tests ordered today include:  No orders of the defined types were placed in this encounter.  Disposition:   FU one year  Signed, Sherren Mocha, MD    03/16/2017 10:38 AM    [facility] [address], [place], [place]  [address] Phone: [phone]; Fax: [phone]

## 2017-03-17 ENCOUNTER — Telehealth: Payer: Self-pay | Admitting: Family Medicine

## 2017-03-17 NOTE — Telephone Encounter (Signed)
Pt is calling and would like to speak to a nurse about his stomach. jw

## 2017-03-19 IMAGING — US US PARACENTESIS
1 series · 5 of 5 positions shown · non-contrast
Comparison: none

INDICATION: Cirrhosis, recent hematemesis ,status post BRTO (balloon occluded
transvenous obliteration of gastric varices ) on 02/05/16, ascites.
Request made for diagnostic and therapeutic paracentesis.

[Series 1: us paracentesis · 0.31mm/px · 5 of 5 slices shown]
[im 1/5]
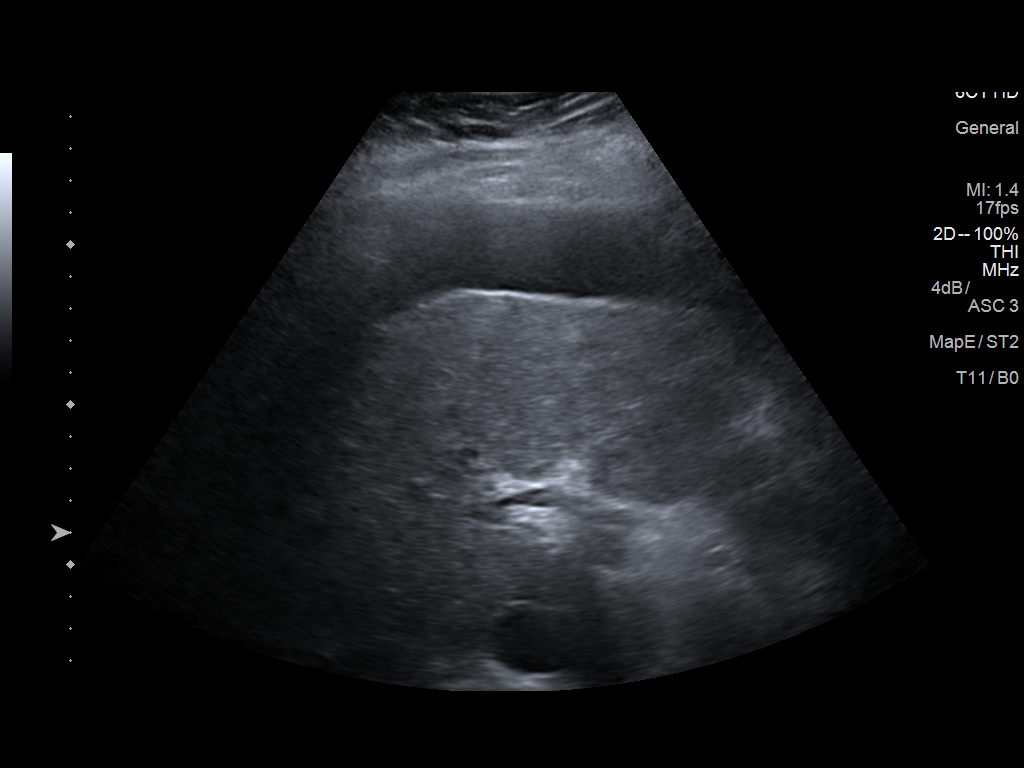
[im 2/5]
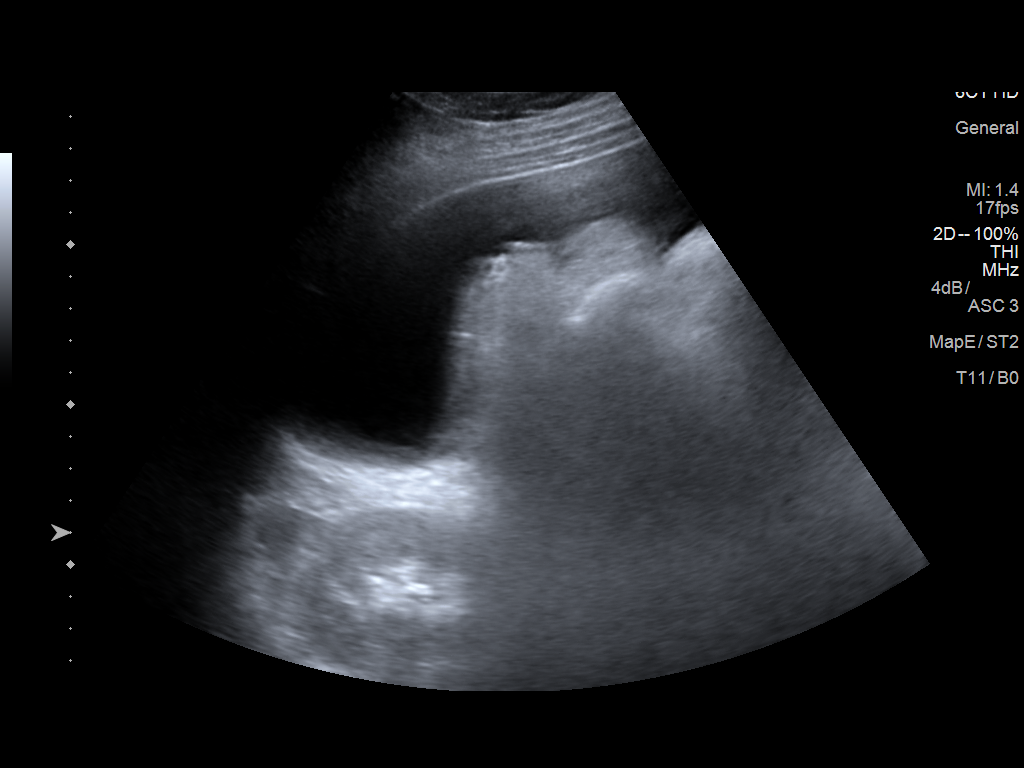
[im 3/5]
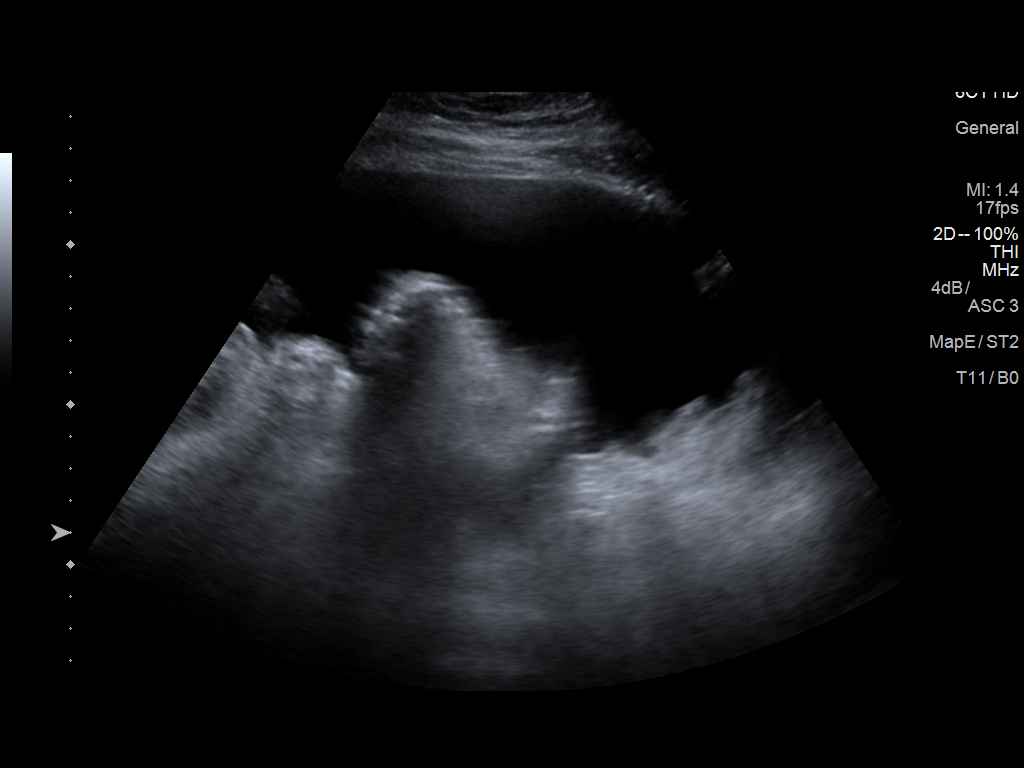
[im 4/5]
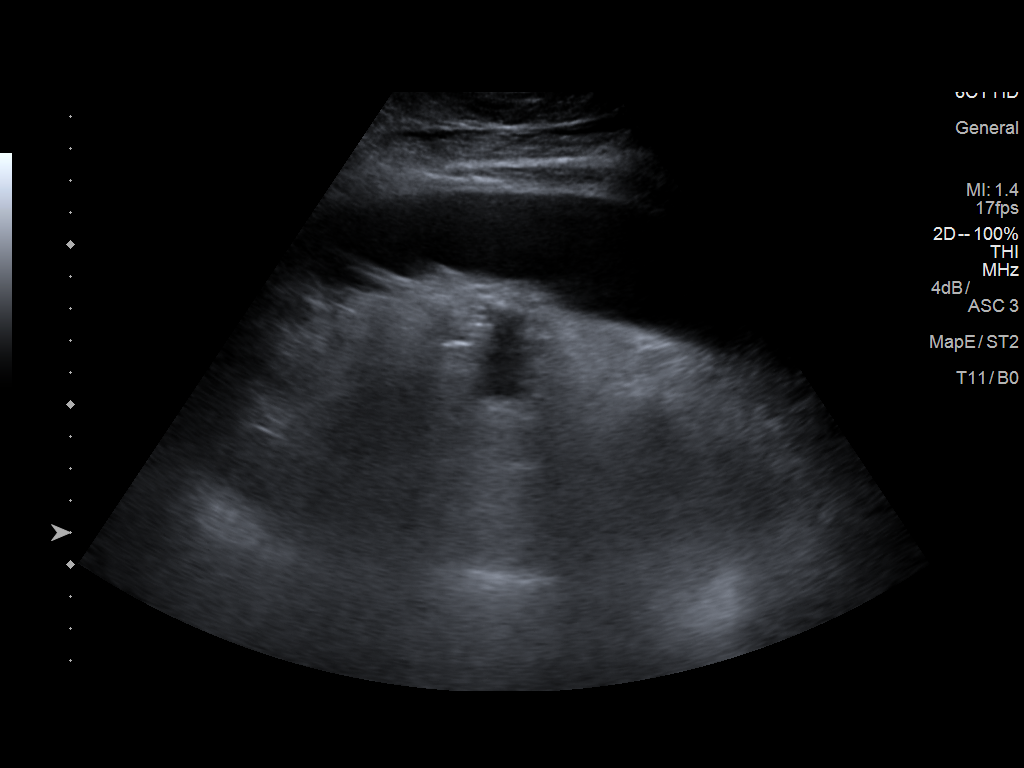
[im 5/5]
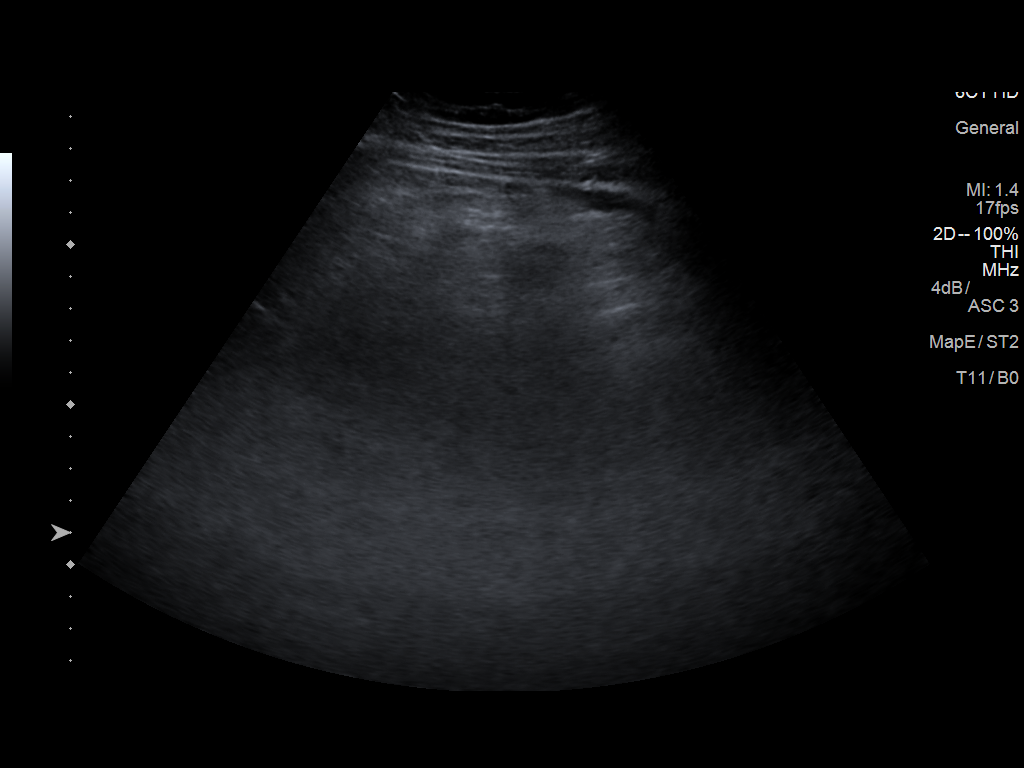

[5 of 5 positions shown; findings below may reference images not displayed]

EXAM:
ULTRASOUND GUIDED DIAGNOSTIC AND THERAPEUTIC PARACENTESIS

MEDICATIONS:
None.

COMPLICATIONS:
None immediate.

PROCEDURE:
Informed written consent was obtained from the patient after a
discussion of the risks, benefits and alternatives to treatment. A
timeout was performed prior to the initiation of the procedure.

Initial ultrasound scanning demonstrates a large amount of ascites
within the right lower abdominal quadrant. The right lower abdomen
was prepped and draped in the usual sterile fashion. 1% lidocaine
was used for local anesthesia.

Following this, a Yueh catheter was introduced. An ultrasound image
was saved for documentation purposes. The paracentesis was
performed. The catheter was removed and a dressing was applied. The
patient tolerated the procedure well without immediate post
procedural complication.
FINDINGS: A total of approximately 6.2 liters of light yellow fluid was
removed. Samples were sent to the laboratory as requested by the
clinical team.
IMPRESSION: Successful ultrasound-guided diagnostic and therapeutic paracentesis
yielding 6.2 liters of peritoneal fluid.

## 2017-03-20 NOTE — Telephone Encounter (Signed)
Tried calling patient regarding stomach issue.  No answer or voice mail. Nurse was out of office on Friday 03/17/17.  Derl Barrow, RN

## 2017-03-21 ENCOUNTER — Encounter (HOSPITAL_COMMUNITY): Payer: Self-pay | Admitting: Student

## 2017-03-21 ENCOUNTER — Ambulatory Visit (HOSPITAL_COMMUNITY)
Admission: RE | Admit: 2017-03-21 | Discharge: 2017-03-21 | Disposition: A | Discharge status: Home / Self Care | Payer: Medicare HMO | Source: Ambulatory Visit | Attending: Student | Admitting: Student

## 2017-03-21 ENCOUNTER — Ambulatory Visit (HOSPITAL_COMMUNITY)
Admission: RE | Admit: 2017-03-21 | Discharge: 2017-03-21 | Disposition: A | Discharge status: Home / Self Care | Payer: Medicare HMO | Source: Ambulatory Visit | Attending: Gastroenterology | Admitting: Gastroenterology

## 2017-03-21 DIAGNOSIS — R188 Other ascites: Secondary | ICD-10-CM | POA: Diagnosis not present

## 2017-03-21 DIAGNOSIS — K7031 Alcoholic cirrhosis of liver with ascites: Secondary | ICD-10-CM

## 2017-03-21 HISTORY — PX: IR PARACENTESIS: IMG2679

## 2017-03-21 LAB — GRAM STAIN: Gram Stain: NONE SEEN

## 2017-03-21 LAB — BODY FLUID CELL COUNT WITH DIFFERENTIAL
LYMPHS FL: 12 %
MONOCYTE-MACROPHAGE-SEROUS FLUID: 54 % (ref 50–90)
NEUTROPHIL FLUID: 34 % — AB (ref 0–25)
WBC FLUID: 257 uL (ref 0–1000)

## 2017-03-21 MED ORDER — LIDOCAINE HCL 1 % IJ SOLN: 5.0000 mL | Freq: Once | INTRAMUSCULAR | Status: DC

## 2017-03-21 MED ORDER — LIDOCAINE HCL 1 % IJ SOLN
INTRAMUSCULAR | Status: AC
  Filled 2017-03-21: qty 20

## 2017-03-21 MED ORDER — ALBUMIN HUMAN 25 % IV SOLN
50.0000 g | Freq: Once | INTRAVENOUS | Status: AC
  Administered 2017-03-21: 50 g via INTRAVENOUS
  Filled 2017-03-21: qty 200

## 2017-03-21 NOTE — Procedures (Signed)
PROCEDURE SUMMARY:  Successful US guided paracentesis from right lateral abdomen.  Yielded 7.0 liters of clear, yellow fluid.  No immediate complications.  Pt tolerated well.   Specimen was sent for labs.  Docia Barrier PA-C 03/21/2017 11:02 AM

## 2017-03-22 ENCOUNTER — Other Ambulatory Visit (HOSPITAL_COMMUNITY): Payer: Self-pay | Admitting: Gastroenterology

## 2017-03-22 ENCOUNTER — Ambulatory Visit (HOSPITAL_COMMUNITY): Payer: Medicare HMO

## 2017-03-22 DIAGNOSIS — K7031 Alcoholic cirrhosis of liver with ascites: Secondary | ICD-10-CM

## 2017-03-22 LAB — PATHOLOGIST SMEAR REVIEW

## 2017-03-22 IMAGING — US US ABDOMEN LIMITED
1 series · 6 of 6 positions shown · non-contrast
Comparison: Multiple priors.  Most recent CT 02/08/2016.

CLINICAL DATA: Cirrhosis.  Status post BRTO.

EXAM:
LIMITED ABDOMEN ULTRASOUND FOR ASCITES
TECHNIQUE: Limited ultrasound survey for ascites was performed in all four
abdominal quadrants.

[Series 1: us abdomen limited · 0.33mm/px · 6 of 6 slices shown]
[im 1/6]
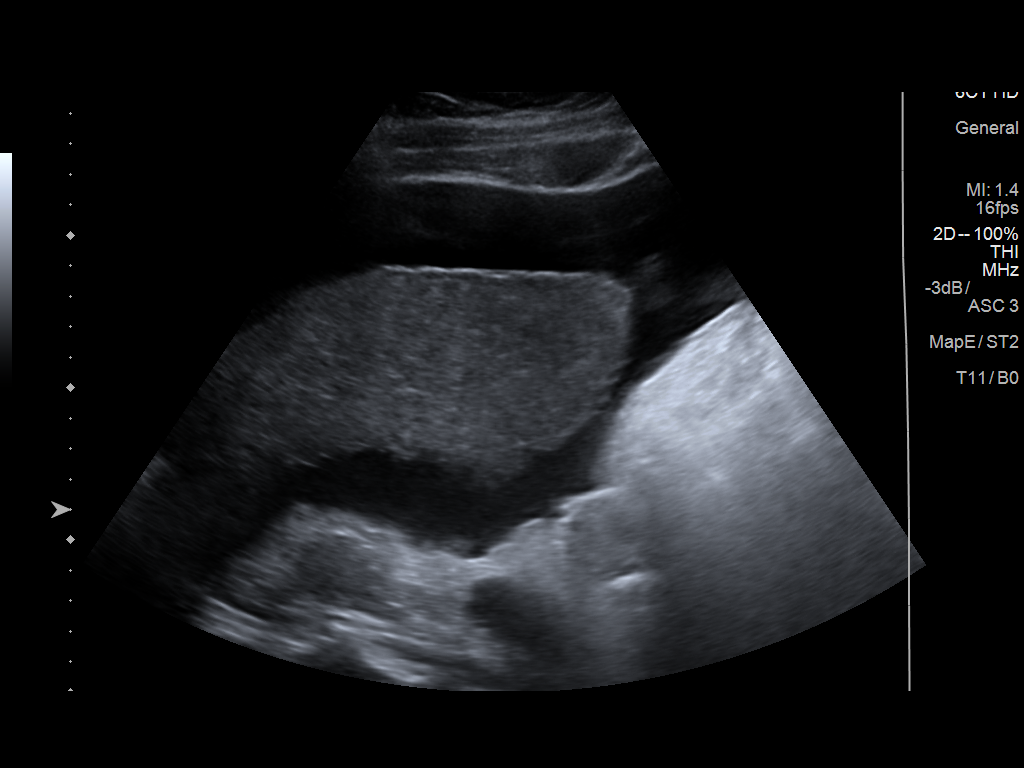
[im 2/6]
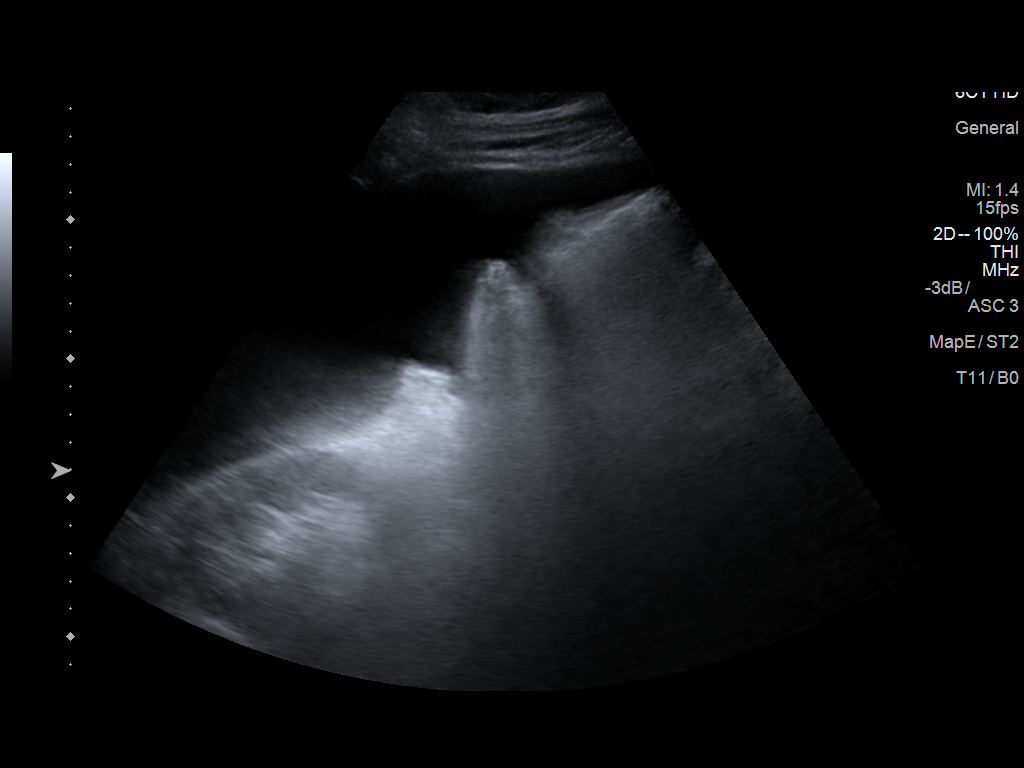
[im 3/6]
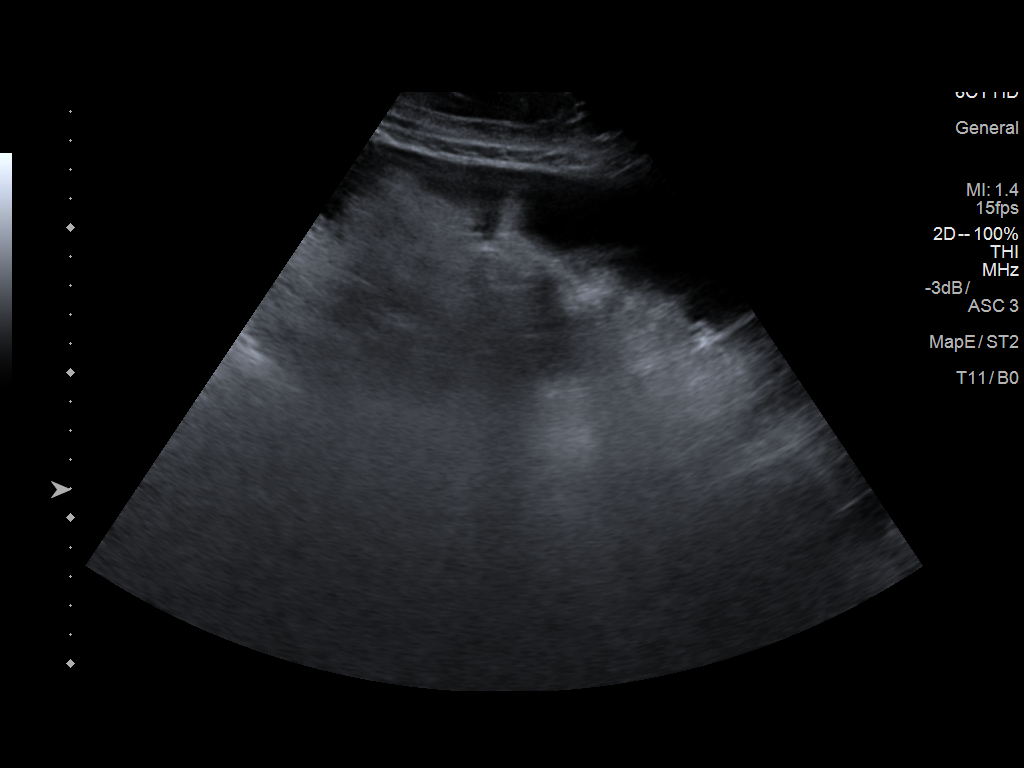
[im 4/6]
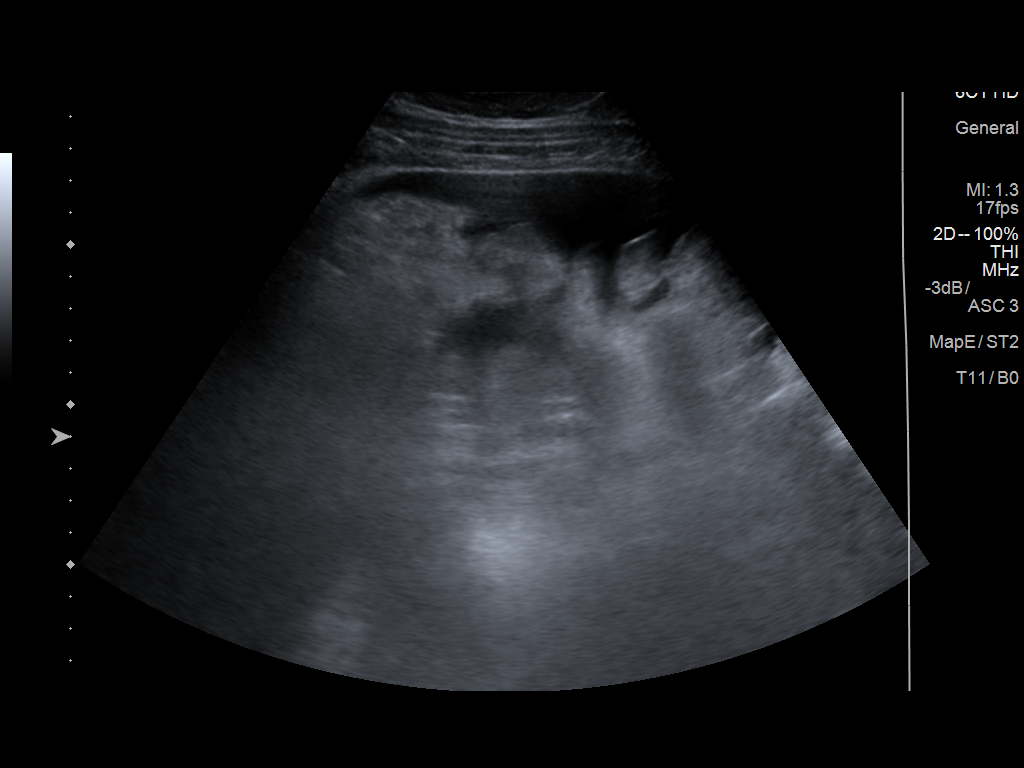
[im 5/6]
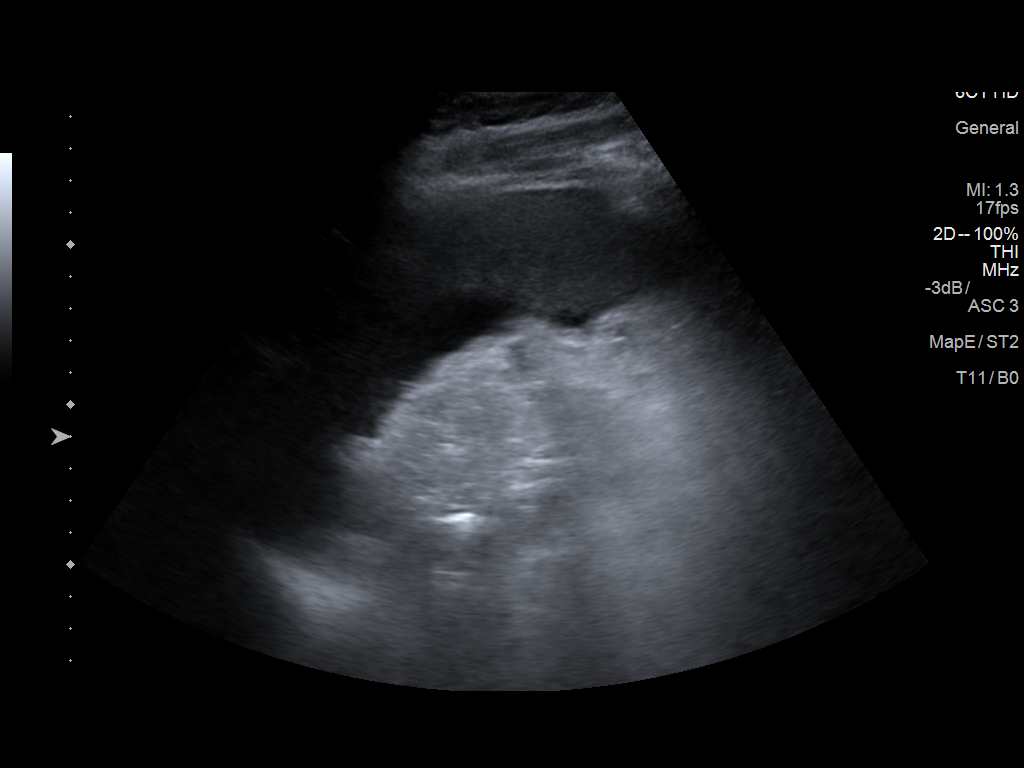
[im 6/6]
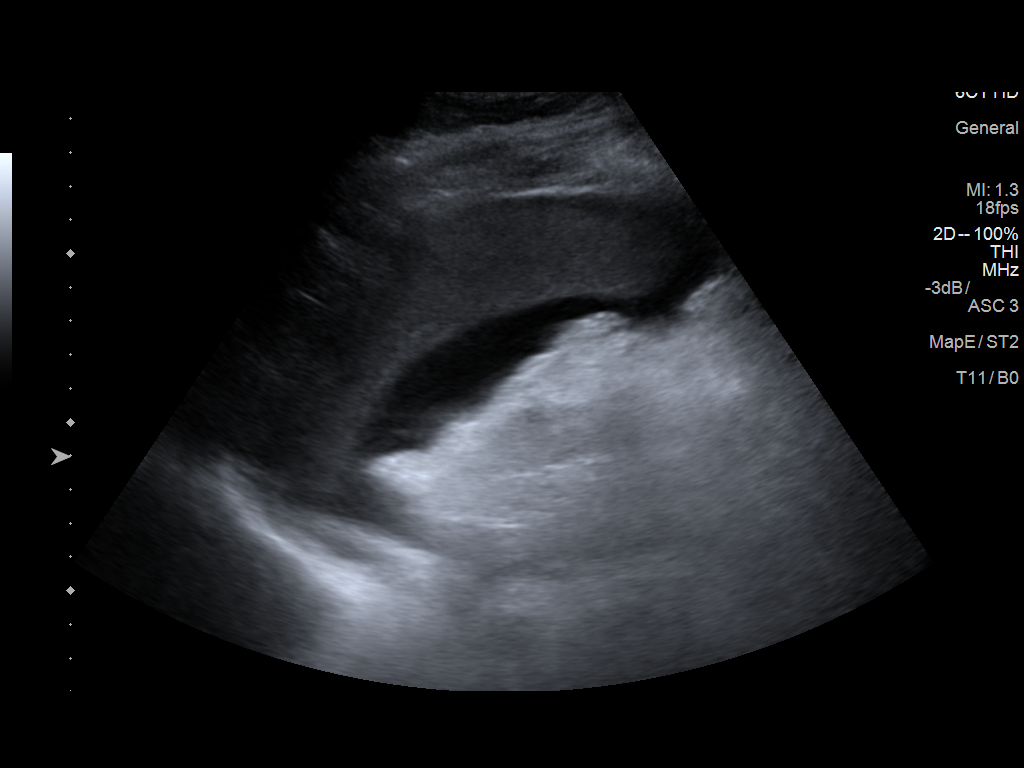

[6 of 6 positions shown; findings below may reference images not displayed]

FINDINGS: A moderate amount of ascites is noted in all 4 quadrants.
IMPRESSION: Positive for ascites.

## 2017-03-23 ENCOUNTER — Ambulatory Visit (HOSPITAL_COMMUNITY): Payer: Medicaid Other

## 2017-03-24 ENCOUNTER — Ambulatory Visit (HOSPITAL_COMMUNITY)
Admission: RE | Admit: 2017-03-24 | Discharge: 2017-03-24 | Disposition: A | Discharge status: Home / Self Care | Payer: Medicare HMO | Source: Ambulatory Visit | Attending: Gastroenterology | Admitting: Gastroenterology

## 2017-03-24 ENCOUNTER — Encounter (HOSPITAL_COMMUNITY): Payer: Self-pay | Admitting: General Surgery

## 2017-03-24 ENCOUNTER — Other Ambulatory Visit (HOSPITAL_COMMUNITY): Payer: Self-pay | Admitting: Gastroenterology

## 2017-03-24 DIAGNOSIS — R188 Other ascites: Secondary | ICD-10-CM | POA: Insufficient documentation

## 2017-03-24 DIAGNOSIS — K7031 Alcoholic cirrhosis of liver with ascites: Secondary | ICD-10-CM

## 2017-03-24 DIAGNOSIS — K7460 Unspecified cirrhosis of liver: Secondary | ICD-10-CM | POA: Diagnosis not present

## 2017-03-24 HISTORY — PX: IR PARACENTESIS: IMG2679

## 2017-03-24 MED ORDER — LIDOCAINE HCL 1 % IJ SOLN
INTRAMUSCULAR | Status: AC
  Filled 2017-03-24: qty 20

## 2017-03-24 MED ORDER — ALBUMIN HUMAN 25 % IV SOLN
50.0000 g | Freq: Once | INTRAVENOUS | Status: AC
  Administered 2017-03-24: 50 g via INTRAVENOUS
  Filled 2017-03-24: qty 200

## 2017-03-24 MED ORDER — LIDOCAINE HCL (PF) 1 % IJ SOLN
INTRAMUSCULAR | Status: DC | PRN
  Administered 2017-03-24: 10 mL

## 2017-03-24 NOTE — Procedures (Signed)
Ultrasound-guided therapeutic paracentesis performed yielding 5 liters of yellow colored fluid. No immediate complications.  Delaina Fetsch E 1:01 PM 03/24/2017

## 2017-03-26 LAB — CULTURE, BODY FLUID W GRAM STAIN -BOTTLE

## 2017-03-26 LAB — CULTURE, BODY FLUID-BOTTLE: CULTURE: NO GROWTH

## 2017-03-26 IMAGING — US US PARACENTESIS
1 series · 4 of 4 positions shown · non-contrast
Comparison: none

INDICATION: Ascites secondary to cirrhosis. History of BRTO. Request for
diagnostic and therapeutic paracentesis.

[Series 1: us paracentesis · 0.28mm/px · 4 of 4 slices shown]
[im 1/4]
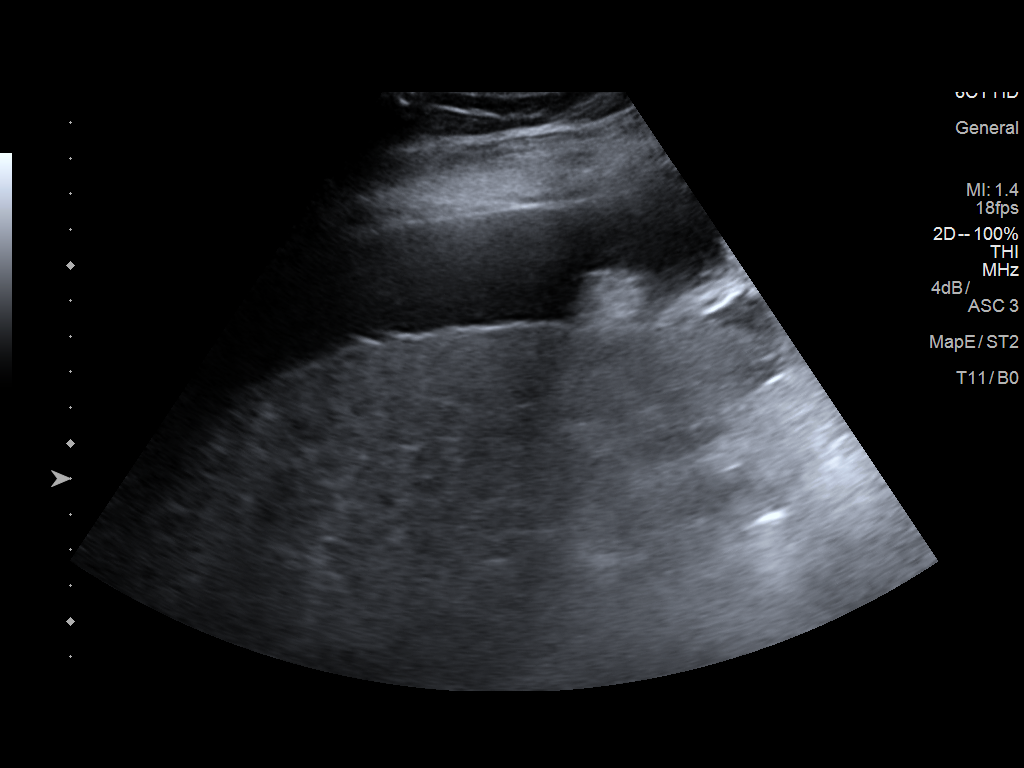
[im 2/4]
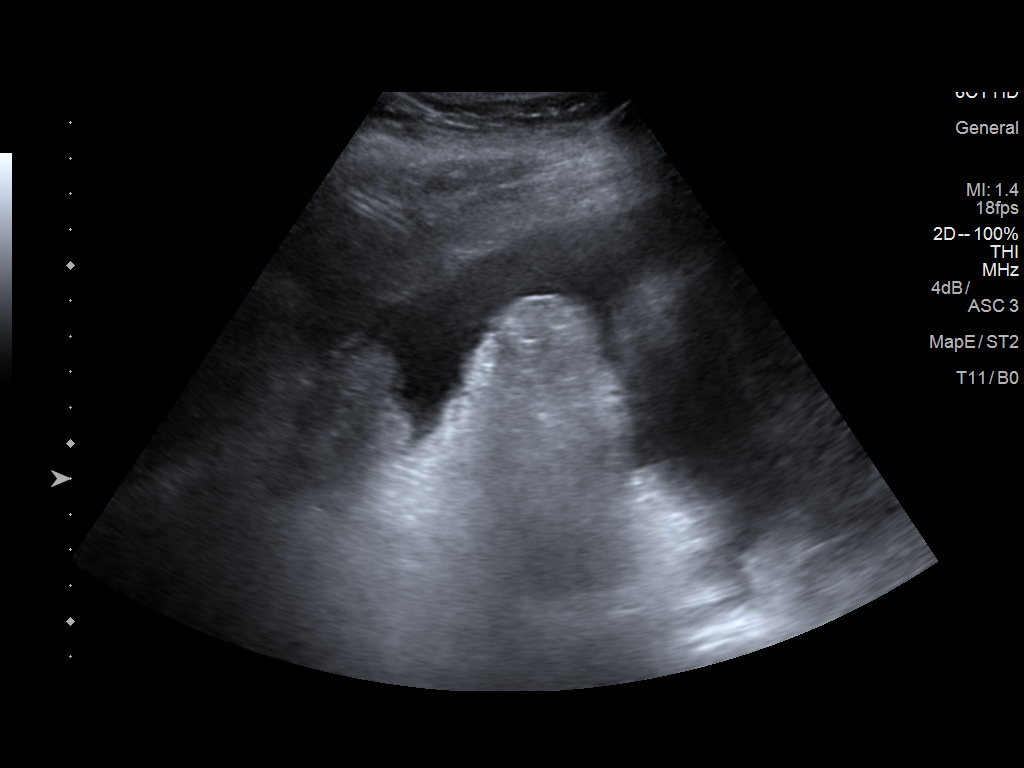
[im 3/4]
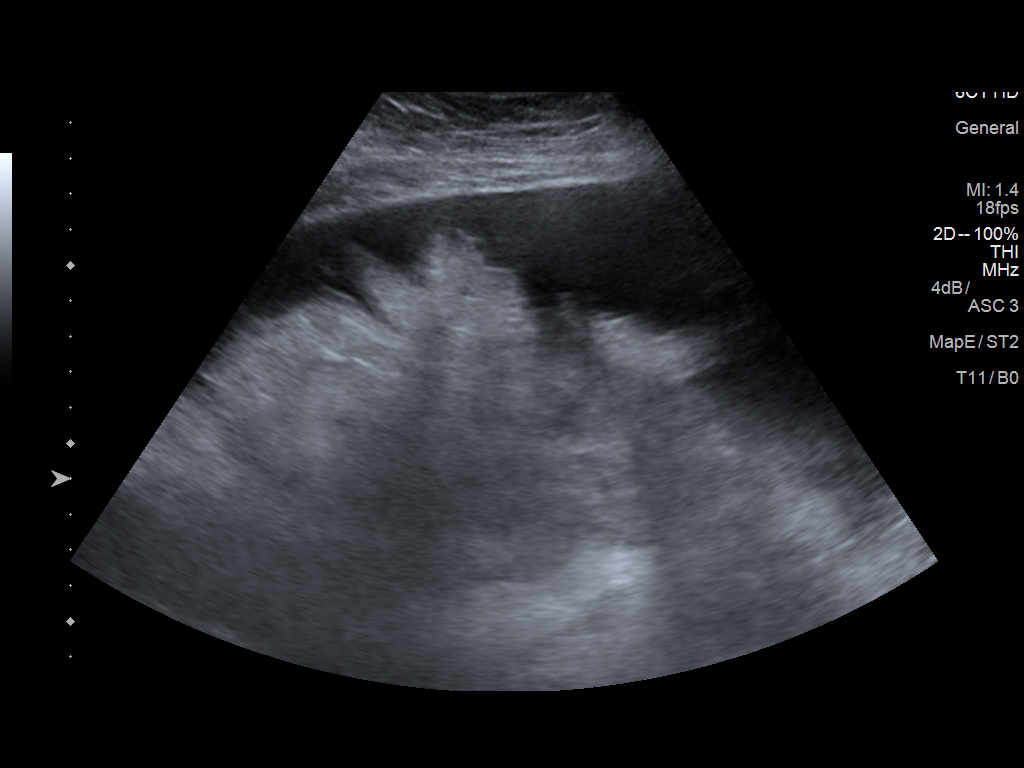
[im 4/4]
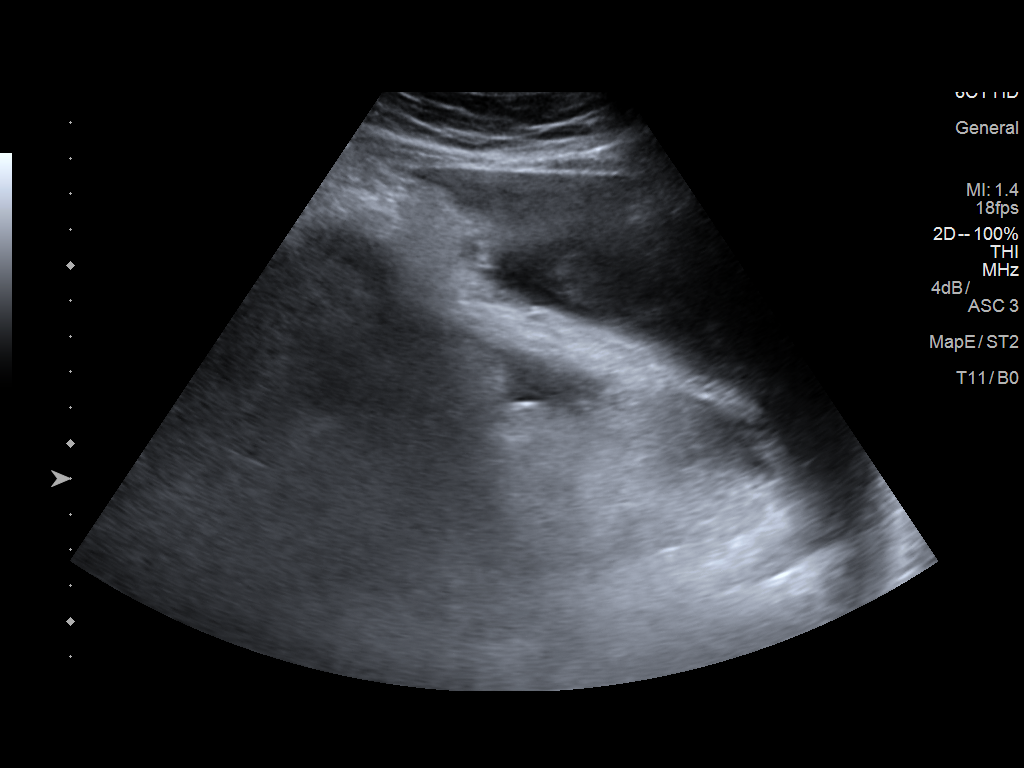

[4 of 4 positions shown; findings below may reference images not displayed]

EXAM:
ULTRASOUND GUIDED LEFT LOWER QUADRANT PARACENTESIS

MEDICATIONS:
1% Lidocaine.

COMPLICATIONS:
None immediate.

PROCEDURE:
Informed written consent was obtained from the patient after a
discussion of the risks, benefits and alternatives to treatment. A
timeout was performed prior to the initiation of the procedure.

Initial ultrasound scanning demonstrates a large amount of ascites
within the right lower abdominal quadrant. The right lower abdomen
was prepped and draped in the usual sterile fashion. 1% lidocaine
with epinephrine was used for local anesthesia.

Following this, a 6 Fr Safe-T-Centesis catheter was introduced. An
ultrasound image was saved for documentation purposes. The
paracentesis was performed. The catheter was removed and a dressing
was applied. The patient tolerated the procedure well without
immediate post procedural complication.
FINDINGS: A total of approximately 8.4 liters of clear yellow fluid was
removed. Samples were sent to the laboratory as requested by the
clinical team.
IMPRESSION: Successful ultrasound-guided paracentesis yielding 8.4 liters of
peritoneal fluid.

## 2017-03-28 ENCOUNTER — Ambulatory Visit (HOSPITAL_COMMUNITY): Payer: Medicaid Other

## 2017-03-28 DIAGNOSIS — F4310 Post-traumatic stress disorder, unspecified: Secondary | ICD-10-CM | POA: Diagnosis not present

## 2017-03-28 DIAGNOSIS — F339 Major depressive disorder, recurrent, unspecified: Secondary | ICD-10-CM | POA: Diagnosis not present

## 2017-03-29 ENCOUNTER — Inpatient Hospital Stay (HOSPITAL_COMMUNITY)
Admission: EM | Admit: 2017-03-29 | Discharge: 2017-04-02 | DRG: 371 | Disposition: A | Discharge status: Other Acute Inpatient Hospital | Payer: Medicare HMO | Attending: Family Medicine | Admitting: Family Medicine

## 2017-03-29 ENCOUNTER — Encounter (HOSPITAL_COMMUNITY): Payer: Self-pay

## 2017-03-29 ENCOUNTER — Encounter (HOSPITAL_COMMUNITY): Payer: Self-pay | Admitting: *Deleted

## 2017-03-29 ENCOUNTER — Emergency Department (HOSPITAL_COMMUNITY): Payer: Medicare HMO

## 2017-03-29 ENCOUNTER — Ambulatory Visit (HOSPITAL_COMMUNITY)
Admission: RE | Admit: 2017-03-29 | Discharge: 2017-03-29 | Disposition: A | Discharge status: Home / Self Care | Payer: Medicare HMO | Source: Ambulatory Visit | Attending: Gastroenterology | Admitting: Gastroenterology

## 2017-03-29 DIAGNOSIS — E875 Hyperkalemia: Secondary | ICD-10-CM | POA: Diagnosis not present

## 2017-03-29 DIAGNOSIS — R112 Nausea with vomiting, unspecified: Secondary | ICD-10-CM | POA: Diagnosis not present

## 2017-03-29 DIAGNOSIS — N179 Acute kidney failure, unspecified: Secondary | ICD-10-CM | POA: Diagnosis not present

## 2017-03-29 DIAGNOSIS — M479 Spondylosis, unspecified: Secondary | ICD-10-CM | POA: Diagnosis present

## 2017-03-29 DIAGNOSIS — Z955 Presence of coronary angioplasty implant and graft: Secondary | ICD-10-CM

## 2017-03-29 DIAGNOSIS — K7031 Alcoholic cirrhosis of liver with ascites: Secondary | ICD-10-CM | POA: Diagnosis not present

## 2017-03-29 DIAGNOSIS — Z8261 Family history of arthritis: Secondary | ICD-10-CM | POA: Diagnosis not present

## 2017-03-29 DIAGNOSIS — E871 Hypo-osmolality and hyponatremia: Secondary | ICD-10-CM | POA: Diagnosis present

## 2017-03-29 DIAGNOSIS — K760 Fatty (change of) liver, not elsewhere classified: Secondary | ICD-10-CM | POA: Diagnosis not present

## 2017-03-29 DIAGNOSIS — I5042 Chronic combined systolic (congestive) and diastolic (congestive) heart failure: Secondary | ICD-10-CM | POA: Diagnosis present

## 2017-03-29 DIAGNOSIS — M19042 Primary osteoarthritis, left hand: Secondary | ICD-10-CM | POA: Diagnosis not present

## 2017-03-29 DIAGNOSIS — R188 Other ascites: Secondary | ICD-10-CM

## 2017-03-29 DIAGNOSIS — F329 Major depressive disorder, single episode, unspecified: Secondary | ICD-10-CM | POA: Diagnosis present

## 2017-03-29 DIAGNOSIS — E785 Hyperlipidemia, unspecified: Secondary | ICD-10-CM | POA: Diagnosis present

## 2017-03-29 DIAGNOSIS — M19041 Primary osteoarthritis, right hand: Secondary | ICD-10-CM | POA: Diagnosis not present

## 2017-03-29 DIAGNOSIS — L409 Psoriasis, unspecified: Secondary | ICD-10-CM | POA: Diagnosis not present

## 2017-03-29 DIAGNOSIS — D693 Immune thrombocytopenic purpura: Secondary | ICD-10-CM | POA: Diagnosis present

## 2017-03-29 DIAGNOSIS — R1084 Generalized abdominal pain: Secondary | ICD-10-CM | POA: Diagnosis not present

## 2017-03-29 DIAGNOSIS — R180 Malignant ascites: Secondary | ICD-10-CM

## 2017-03-29 DIAGNOSIS — D62 Acute posthemorrhagic anemia: Secondary | ICD-10-CM | POA: Diagnosis not present

## 2017-03-29 DIAGNOSIS — Z8249 Family history of ischemic heart disease and other diseases of the circulatory system: Secondary | ICD-10-CM

## 2017-03-29 DIAGNOSIS — M170 Bilateral primary osteoarthritis of knee: Secondary | ICD-10-CM | POA: Diagnosis present

## 2017-03-29 DIAGNOSIS — I480 Paroxysmal atrial fibrillation: Secondary | ICD-10-CM | POA: Diagnosis present

## 2017-03-29 DIAGNOSIS — K652 Spontaneous bacterial peritonitis: Secondary | ICD-10-CM | POA: Diagnosis present

## 2017-03-29 DIAGNOSIS — K766 Portal hypertension: Secondary | ICD-10-CM | POA: Diagnosis not present

## 2017-03-29 DIAGNOSIS — R109 Unspecified abdominal pain: Secondary | ICD-10-CM | POA: Diagnosis present

## 2017-03-29 DIAGNOSIS — I8500 Esophageal varices without bleeding: Secondary | ICD-10-CM | POA: Diagnosis not present

## 2017-03-29 DIAGNOSIS — I2510 Atherosclerotic heart disease of native coronary artery without angina pectoris: Secondary | ICD-10-CM | POA: Diagnosis present

## 2017-03-29 DIAGNOSIS — I252 Old myocardial infarction: Secondary | ICD-10-CM | POA: Diagnosis not present

## 2017-03-29 DIAGNOSIS — K7460 Unspecified cirrhosis of liver: Secondary | ICD-10-CM | POA: Diagnosis not present

## 2017-03-29 DIAGNOSIS — Z9049 Acquired absence of other specified parts of digestive tract: Secondary | ICD-10-CM | POA: Diagnosis not present

## 2017-03-29 DIAGNOSIS — Z888 Allergy status to other drugs, medicaments and biological substances status: Secondary | ICD-10-CM

## 2017-03-29 DIAGNOSIS — Z8041 Family history of malignant neoplasm of ovary: Secondary | ICD-10-CM

## 2017-03-29 DIAGNOSIS — Z833 Family history of diabetes mellitus: Secondary | ICD-10-CM

## 2017-03-29 DIAGNOSIS — R509 Fever, unspecified: Secondary | ICD-10-CM | POA: Diagnosis not present

## 2017-03-29 DIAGNOSIS — K7290 Hepatic failure, unspecified without coma: Secondary | ICD-10-CM | POA: Diagnosis not present

## 2017-03-29 DIAGNOSIS — K7469 Other cirrhosis of liver: Secondary | ICD-10-CM | POA: Diagnosis not present

## 2017-03-29 DIAGNOSIS — K767 Hepatorenal syndrome: Secondary | ICD-10-CM | POA: Diagnosis not present

## 2017-03-29 DIAGNOSIS — Z823 Family history of stroke: Secondary | ICD-10-CM | POA: Diagnosis not present

## 2017-03-29 DIAGNOSIS — Z87891 Personal history of nicotine dependence: Secondary | ICD-10-CM

## 2017-03-29 DIAGNOSIS — R140 Abdominal distension (gaseous): Secondary | ICD-10-CM | POA: Diagnosis not present

## 2017-03-29 DIAGNOSIS — I499 Cardiac arrhythmia, unspecified: Secondary | ICD-10-CM | POA: Diagnosis not present

## 2017-03-29 DIAGNOSIS — Z8673 Personal history of transient ischemic attack (TIA), and cerebral infarction without residual deficits: Secondary | ICD-10-CM

## 2017-03-29 DIAGNOSIS — K920 Hematemesis: Secondary | ICD-10-CM | POA: Diagnosis not present

## 2017-03-29 DIAGNOSIS — G4733 Obstructive sleep apnea (adult) (pediatric): Secondary | ICD-10-CM | POA: Diagnosis present

## 2017-03-29 DIAGNOSIS — Z886 Allergy status to analgesic agent status: Secondary | ICD-10-CM

## 2017-03-29 DIAGNOSIS — E440 Moderate protein-calorie malnutrition: Secondary | ICD-10-CM | POA: Diagnosis not present

## 2017-03-29 DIAGNOSIS — I959 Hypotension, unspecified: Secondary | ICD-10-CM | POA: Diagnosis not present

## 2017-03-29 DIAGNOSIS — K219 Gastro-esophageal reflux disease without esophagitis: Secondary | ICD-10-CM | POA: Diagnosis not present

## 2017-03-29 DIAGNOSIS — N184 Chronic kidney disease, stage 4 (severe): Secondary | ICD-10-CM | POA: Diagnosis not present

## 2017-03-29 DIAGNOSIS — K922 Gastrointestinal hemorrhage, unspecified: Secondary | ICD-10-CM | POA: Diagnosis not present

## 2017-03-29 LAB — URINALYSIS, ROUTINE W REFLEX MICROSCOPIC
Bilirubin Urine: NEGATIVE
Glucose, UA: NEGATIVE mg/dL
Hgb urine dipstick: NEGATIVE
Ketones, ur: NEGATIVE mg/dL
Nitrite: NEGATIVE
Protein, ur: NEGATIVE mg/dL
RBC / HPF: NONE SEEN RBC/hpf (ref 0–5)
Specific Gravity, Urine: 1.017 (ref 1.005–1.030)
pH: 5 (ref 5.0–8.0)

## 2017-03-29 LAB — I-STAT CG4 LACTIC ACID, ED
LACTIC ACID, VENOUS: 2.53 mmol/L — AB (ref 0.5–1.9)
LACTIC ACID, VENOUS: 1.28 mmol/L (ref 0.5–1.9)

## 2017-03-29 LAB — CBC WITH DIFFERENTIAL/PLATELET
BASOS ABS: 0 10*3/uL (ref 0.0–0.1)
Basophils Relative: 0 %
Eosinophils Absolute: 0 10*3/uL (ref 0.0–0.7)
Eosinophils Relative: 0 %
HCT: 27.2 % — ABNORMAL LOW (ref 39.0–52.0)
Hemoglobin: 9.2 g/dL — ABNORMAL LOW (ref 13.0–17.0)
Lymphocytes Relative: 9 %
Lymphs Abs: 0.7 10*3/uL (ref 0.7–4.0)
MCH: 31.1 pg (ref 26.0–34.0)
MCHC: 33.8 g/dL (ref 30.0–36.0)
MCV: 91.9 fL (ref 78.0–100.0)
MONO ABS: 0.7 10*3/uL (ref 0.1–1.0)
MONOS PCT: 9 %
NEUTROS ABS: 6.3 10*3/uL (ref 1.7–7.7)
Neutrophils Relative %: 81 %
Platelets: 76 10*3/uL — ABNORMAL LOW (ref 150–400)
RBC: 2.96 MIL/uL — ABNORMAL LOW (ref 4.22–5.81)
RDW: 14.1 % (ref 11.5–15.5)
WBC: 7.8 10*3/uL (ref 4.0–10.5)

## 2017-03-29 LAB — COMPREHENSIVE METABOLIC PANEL
ALT: 45 U/L (ref 17–63)
AST: 56 U/L — ABNORMAL HIGH (ref 15–41)
Albumin: 3.2 g/dL — ABNORMAL LOW (ref 3.5–5.0)
Alkaline Phosphatase: 107 U/L (ref 38–126)
Anion gap: 8 (ref 5–15)
BUN: 55 mg/dL — ABNORMAL HIGH (ref 6–20)
CO2: 13 mmol/L — ABNORMAL LOW (ref 22–32)
Calcium: 8.7 mg/dL — ABNORMAL LOW (ref 8.9–10.3)
Chloride: 107 mmol/L (ref 101–111)
Creatinine, Ser: 2.27 mg/dL — ABNORMAL HIGH (ref 0.61–1.24)
GFR calc Af Amer: 34 mL/min — ABNORMAL LOW (ref >60)
GFR calc non Af Amer: 29 mL/min — ABNORMAL LOW (ref >60)
Glucose, Bld: 120 mg/dL — ABNORMAL HIGH (ref 65–99)
Potassium: 5.7 mmol/L — ABNORMAL HIGH (ref 3.5–5.1)
Sodium: 128 mmol/L — ABNORMAL LOW (ref 135–145)
Total Bilirubin: 2.6 mg/dL — ABNORMAL HIGH (ref 0.3–1.2)
Total Protein: 6.1 g/dL — ABNORMAL LOW (ref 6.5–8.1)

## 2017-03-29 LAB — PROTIME-INR
INR: 1.56
Prothrombin Time: 18.8 seconds — ABNORMAL HIGH (ref 11.4–15.2)

## 2017-03-29 LAB — LIPASE, BLOOD: Lipase: 43 U/L (ref 11–51)

## 2017-03-29 LAB — APTT: aPTT: 36 seconds (ref 24–36)

## 2017-03-29 LAB — PROCALCITONIN: Procalcitonin: 0.26 ng/mL

## 2017-03-29 MED ORDER — BUPROPION HCL ER (XL) 150 MG PO TB24
150.0000 mg | ORAL_TABLET | Freq: Every day | ORAL | Status: DC
  Administered 2017-03-30 – 2017-04-02 (×4): 150 mg via ORAL
  Filled 2017-03-29 (×4): qty 1

## 2017-03-29 MED ORDER — HEPARIN SODIUM (PORCINE) 5000 UNIT/ML IJ SOLN
5000.0000 [IU] | Freq: Three times a day (TID) | INTRAMUSCULAR | Status: DC
  Administered 2017-03-29 – 2017-03-31 (×5): 5000 [IU] via SUBCUTANEOUS
  Filled 2017-03-29 (×5): qty 1

## 2017-03-29 MED ORDER — PIPERACILLIN-TAZOBACTAM 3.375 G IVPB 30 MIN
3.3750 g | Freq: Once | INTRAVENOUS | Status: AC
  Administered 2017-03-29: 3.375 g via INTRAVENOUS
  Filled 2017-03-29: qty 50

## 2017-03-29 MED ORDER — PANTOPRAZOLE SODIUM 40 MG PO TBEC
40.0000 mg | DELAYED_RELEASE_TABLET | Freq: Two times a day (BID) | ORAL | Status: DC
  Administered 2017-03-29 – 2017-04-02 (×8): 40 mg via ORAL
  Filled 2017-03-29 (×8): qty 1

## 2017-03-29 MED ORDER — FENTANYL CITRATE (PF) 100 MCG/2ML IJ SOLN
50.0000 ug | Freq: Once | INTRAMUSCULAR | Status: AC
  Administered 2017-03-29: 50 ug via INTRAVENOUS
  Filled 2017-03-29: qty 2

## 2017-03-29 MED ORDER — DEXTROSE 5 % IV SOLN
2.0000 g | INTRAVENOUS | Status: DC
  Administered 2017-03-29 – 2017-03-31 (×3): 2 g via INTRAVENOUS
  Filled 2017-03-29 (×5): qty 2

## 2017-03-29 MED ORDER — SUCRALFATE 1 G PO TABS: 1.0000 g | ORAL_TABLET | Freq: Three times a day (TID) | ORAL | Status: DC | PRN

## 2017-03-29 MED ORDER — PIPERACILLIN-TAZOBACTAM 3.375 G IVPB
3.3750 g | Freq: Three times a day (TID) | INTRAVENOUS | Status: DC
  Filled 2017-03-29: qty 50

## 2017-03-29 MED ORDER — ELTROMBOPAG OLAMINE 50 MG PO TABS: 50.0000 mg | ORAL_TABLET | Freq: Every day | ORAL | Status: DC

## 2017-03-29 MED ORDER — SODIUM CHLORIDE 0.9% FLUSH
3.0000 mL | Freq: Two times a day (BID) | INTRAVENOUS | Status: DC
  Administered 2017-03-29 – 2017-04-02 (×8): 3 mL via INTRAVENOUS

## 2017-03-29 MED ORDER — LACTULOSE 10 GM/15ML PO SOLN
30.0000 g | Freq: Two times a day (BID) | ORAL | Status: DC
  Administered 2017-03-29 – 2017-03-30 (×3): 30 g via ORAL
  Administered 2017-03-31: 20 g via ORAL
  Administered 2017-04-01 – 2017-04-02 (×2): 30 g via ORAL
  Filled 2017-03-29 (×8): qty 45

## 2017-03-29 MED ORDER — SODIUM CHLORIDE 0.9 % IV BOLUS (SEPSIS)
1000.0000 mL | Freq: Once | INTRAVENOUS | Status: AC
  Administered 2017-03-29: 1000 mL via INTRAVENOUS

## 2017-03-29 MED ORDER — SODIUM POLYSTYRENE SULFONATE 15 GM/60ML PO SUSP
15.0000 g | Freq: Once | ORAL | Status: AC
  Administered 2017-03-29: 15 g via ORAL
  Filled 2017-03-29: qty 60

## 2017-03-29 MED ORDER — ADULT MULTIVITAMIN W/MINERALS CH
1.0000 | ORAL_TABLET | Freq: Every day | ORAL | Status: DC
  Administered 2017-03-30 – 2017-04-02 (×4): 1 via ORAL
  Filled 2017-03-29 (×4): qty 1

## 2017-03-29 MED ORDER — FENTANYL CITRATE (PF) 100 MCG/2ML IJ SOLN
25.0000 ug | Freq: Four times a day (QID) | INTRAMUSCULAR | Status: DC | PRN
  Administered 2017-03-31 – 2017-04-01 (×2): 25 ug via INTRAVENOUS
  Filled 2017-03-29 (×3): qty 2

## 2017-03-29 MED ORDER — IOPAMIDOL (ISOVUE-300) INJECTION 61%
INTRAVENOUS | Status: AC
  Filled 2017-03-29: qty 30

## 2017-03-29 MED ORDER — FENTANYL CITRATE (PF) 100 MCG/2ML IJ SOLN
25.0000 ug | Freq: Once | INTRAMUSCULAR | Status: AC
  Administered 2017-03-29: 25 ug via INTRAVENOUS
  Filled 2017-03-29: qty 2

## 2017-03-29 MED ORDER — FENTANYL CITRATE (PF) 100 MCG/2ML IJ SOLN
25.0000 ug | Freq: Four times a day (QID) | INTRAMUSCULAR | Status: DC
  Administered 2017-03-29 – 2017-03-30 (×5): 25 ug via INTRAVENOUS
  Filled 2017-03-29 (×5): qty 2

## 2017-03-29 NOTE — ED Provider Notes (Signed)
[facility] DEPT Provider Note   CSN: [id number] Arrival date & time: 03/29/17  0946   History   Chief Complaint Chief Complaint  Patient presents with  . Abdominal Pain    HPI Vincent Ochoa is a 61 y.o. 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 SBP.  Patient went to IR for scheduled LVP this morning and he was noted to have worsening abdominal distention, abdominal pain, nausea, NBNB vomiting, diarrhea and erythematous rash to periumbilical abdomen.  Patient reports fever of 100.12F and "shakiness" at home yesterday.  Family member at bedside denies confusion but he does tend to wax and wane at baseline. Patient reports h/o hemorrhoids that have been bleeding at baseline.    HPI  Past Medical History:  Diagnosis Date  . Arthritis    "hands, knees, back" (07/05/2016)  . Back pain, lumbosacral 2014  . CHF (congestive heart failure) ([place])   . Cirrhosis ([place])   . Coronary artery disease    a. Evaluated by Dr. Stanford Breed 2012 - nonobstructive 2007-2008 by cath. b. Abnormal stress test 10/2014 but cath deferred temporarily due to thrombocytopenia. c. 10/2014: readm with AF-RVR/NSTEMI - s/p BMS to mLCx 11/04/14 (mod dz in RCA);  d. relook cath 01/2015 and 06/02/2015 patent stent, nonobs CAD, EF 55-65%.  . Elevated LFTs   . Facial cellulitis 2014   Periorbital  . Fatty liver US - 2012  . Frequent headaches   . GERD (gastroesophageal reflux disease)   . History of blood transfusion 01/2016   "lost alot of blood vomiting"  . Hyperlipidemia   . Morbid obesity ([place])   . Obesity   . OSA on CPAP    severe with AHI 56/hr with successful CPAP titration to 18cm H2O  . PAF (paroxysmal atrial fibrillation) ([place])    a. Episode 10/2014 at time of NSTEMI, spont converted to NSR. Observation for further episodes (not on anticoag at  present time due to [facility] = 1, thrombocytopenia).  . Psoriasis   . Sinus bradycardia   . Stroke [facility])    "I've been told I'd had a mini stroke on my left side" (07/05/2016)  . Thrombocytopenia ([place])    a. Onset unclear, but noted 2012. ? 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 s/p prednisone, IVIG.    Patient Active Problem List   Diagnosis Date Noted  . Anemia due to chronic illness 12/01/2016  . Hematuria   . SBP (spontaneous bacterial peritonitis) ([place]) 11/19/2016  . Hematuria, microscopic 10/18/2016  . Rectal bleeding 09/26/2016  . Nephrolithiasis 09/15/2016  . Chronic ITP (idiopathic thrombocytopenia) ([facility]) 09/15/2016  . Varices, esophageal ([place]) 09/15/2016  . Essential hypertension   . Hematemesis 06/21/2016  . Chronic combined systolic and diastolic congestive heart failure ([place])   . Erectile dysfunction 06/03/2016  . Cirrhosis of liver with ascites ([place]) 02/25/2016  . Portal vein thrombosis   . Ascites 02/16/2016  . Alcoholic cirrhosis of liver with ascites ([place])   . Diastolic heart failure ([place]) 10/07/2015  . Leukopenia 09/15/2015  . Acrochordon 07/18/2015  . PAF (paroxysmal atrial fibrillation) ([place])   . Obese   . Knee pain, bilateral 03/05/2015  . Depression 01/08/2015  . OSA (obstructive sleep apnea)   . NSTEMI (non-ST elevated myocardial infarction) ([place])   . Back pain, lumbosacral 01/04/2013  . Thrombocytopenia ([place])  11/29/2012  . GERD (gastroesophageal reflux disease) 08/15/2011  . Coronary artery disease involving native coronary artery of native heart without angina pectoris 07/29/2011  . Psoriasis 07/29/2011    Past Surgical History:  Procedure Laterality Date  . CARDIAC CATHETERIZATION    . CARDIAC CATHETERIZATION N/A 06/02/2015   Procedure: Left Heart Cath and Coronary Angiography;  Surgeon: Jettie Booze, MD;  Location: [place] CV LAB;  Service: Cardiovascular;  Laterality: N/A;  . COLONOSCOPY WITH  PROPOFOL N/A 09/26/2016   Procedure: COLONOSCOPY WITH PROPOFOL;  Surgeon: Wilford Corner, MD;  Location: [facility] ENDOSCOPY;  Service: Endoscopy;  Laterality: N/A;  . ESOPHAGOGASTRODUODENOSCOPY N/A 06/22/2016   Procedure: ESOPHAGOGASTRODUODENOSCOPY (EGD);  Surgeon: Clarene Essex, MD;  Location: [facility] ENDOSCOPY;  Service: Endoscopy;  Laterality: N/A;  . ESOPHAGOGASTRODUODENOSCOPY N/A 11/24/2016   Procedure: ESOPHAGOGASTRODUODENOSCOPY (EGD);  Surgeon: Clarene Essex, MD;  Location: [facility] ENDOSCOPY;  Service: Endoscopy;  Laterality: N/A;  . ESOPHAGOGASTRODUODENOSCOPY (EGD) WITH PROPOFOL N/A 08/17/2016   Procedure: ESOPHAGOGASTRODUODENOSCOPY (EGD) WITH PROPOFOL;  Surgeon: Otis Brace, MD;  Location: [place];  Service: Gastroenterology;  Laterality: N/A;  . FACIAL COSMETIC SURGERY  1990s   "rodeo-related injury"  . IR GENERIC HISTORICAL  09/14/2016   IR RADIOLOGIST EVAL & MGMT 09/14/2016 Sandi Mariscal, MD [facility] INTERV RAD  . IR GENERIC HISTORICAL  12/15/2016   IR RADIOLOGIST EVAL & MGMT 12/15/2016 Sandi Mariscal, MD [facility] INTERV RAD  . IR GENERIC HISTORICAL  02/09/2017   IR PARACENTESIS 02/09/2017 Saverio Danker, PA-C [facility]INTER[facility]  . IR PARACENTESIS  03/08/2017  . IR PARACENTESIS  03/14/2017  . IR PARACENTESIS  03/21/2017  . IR PARACENTESIS  03/24/2017  . KNEE ARTHROPLASTY Left 1970's   "put cartilage in it"  . LAPAROSCOPIC CHOLECYSTECTOMY  2007  . LEFT HEART CATHETERIZATION WITH CORONARY ANGIOGRAM N/A 11/04/2014   Procedure: LEFT HEART CATHETERIZATION WITH CORONARY ANGIOGRAM;  Surgeon: Jettie Booze, MD;  Location: [facility] CATH LAB;  Service: Cardiovascular;  Laterality: N/A;  . LEFT HEART CATHETERIZATION WITH CORONARY ANGIOGRAM N/A 01/28/2015   Procedure: LEFT HEART CATHETERIZATION WITH CORONARY ANGIOGRAM;  Surgeon: Sherren Mocha, MD;  Location: [facility] CATH LAB;  Service: Cardiovascular;  Laterality: N/A;  . liver cirrhosis    . RADIOLOGY WITH ANESTHESIA N/A 02/04/2016   Procedure: RADIOLOGY WITH ANESTHESIA;  Surgeon:  Medication Radiologist, MD;  Location: [place];  Service: Radiology;  Laterality: N/A;  . TONSILLECTOMY  1960's       Home Medications    Prior to Admission medications   Medication Sig Start Date End Date Taking? Authorizing Provider  buPROPion (WELLBUTRIN XL) 150 MG 24 hr tablet Take 1 tablet (150 mg total) by mouth every other day. Patient taking differently: Take 150 mg by mouth daily.  12/14/16  Yes Orson Eva J, DO  ciprofloxacin (CIPRO) 500 MG tablet Take 500 mg by mouth daily. 03/04/17  Yes [provider]  Lactulose 20 GM/30ML SOLN Take 45 mLs (30 g total) by mouth 2 (two) times daily. 01/06/17  Yes Bufford Lope, DO  Multiple Vitamins-Minerals (MULTIVITAMIN GUMMIES ADULT) CHEW Chew 1 tablet by mouth daily. 12/01/16  Yes Gorsuch, Ni, MD  nitroGLYCERIN (NITROSTAT) 0.4 MG SL tablet Place 1 tablet (0.4 mg total) under the tongue every 5 (five) minutes as needed for chest pain (up to 3 doses). 03/16/17  Yes Sherren Mocha, MD  pantoprazole (PROTONIX) 40 MG tablet Take 1 tablet (40 mg total) by mouth 2 (two) times daily. 12/14/16  Yes Bufford Lope, DO  shark liver oil-cocoa butter (PREPARATION H)  0.25-3-85.5 % suppository Place 1 suppository rectally as needed for hemorrhoids.   Yes [provider]  sucralfate (CARAFATE) 1 g tablet Take 1 g by mouth 3 (three) times daily as needed (stomach irritation).   Yes [provider]  atorvastatin (LIPITOR) 40 MG tablet TAKE ONE (1) TABLET EACH DAY AT 8PM Patient not taking: Reported on 03/29/2017 01/13/17   Bufford Lope, DO  eltrombopag (PROMACTA) 50 MG tablet Take 1 tablet (50 mg total) by mouth daily. Take on an empty stomach 1 hour before a meal or 2 hours after Patient not taking: Reported on 03/29/2017 12/01/16   Heath Lark, MD    Family History Family History  Problem Relation Age of Onset  . Cirrhosis Mother   . Heart attack Mother   . Hypertension Mother   . Hypertension Father   . Stroke Father   . Arthritis  Maternal Grandmother   . Heart disease Sister   . Diabetes Sister   . Ovarian cancer Sister   . Heart disease Other     Maternal/paternal grandparents  . Heart attack Maternal Aunt     Social History Social History  Substance Use Topics  . Smoking status: Former Smoker    Packs/day: 1.50    Years: 43.00    Types: Cigarettes    Quit date: 02/19/2016  . Smokeless tobacco: Former Systems developer  . Alcohol use 0.0 oz/week     Comment: 07/05/2016 "mostly stopped in 1979; might have a beer q 6 months or so"     Allergies   Asa [aspirin]; Eliquis [apixaban]; Nsaids; Plavix [clopidogrel]; Statins; and Tolmetin   Review of Systems Review of Systems  Constitutional: Positive for chills and fever.  HENT: Negative for congestion.   Eyes: Negative for visual disturbance.  Respiratory: Negative for cough, choking and shortness of breath.   Cardiovascular: Negative for chest pain and leg swelling.  Gastrointestinal: Positive for abdominal distention, blood in stool, diarrhea, nausea and vomiting. Negative for constipation.  Genitourinary: Negative for difficulty urinating and hematuria.  Musculoskeletal: Positive for joint swelling. Negative for arthralgias.  Skin: Positive for color change.  Neurological: Positive for tremors and light-headedness. Negative for syncope, speech difficulty, weakness and headaches.  Psychiatric/Behavioral: Negative for confusion.     Physical Exam Updated Vital Signs BP 105/66   Pulse 88   Temp 97.7 F (36.5 C) (Oral)   Resp 16   Ht 6\' 4"  (1.93 m)   Wt 110.2 kg   SpO2 100%   BMI 29.58 kg/m   Physical Exam  Constitutional: He is oriented to person, place, and time. He appears well-developed and well-nourished.  Non toxic appearing but patient appears uncomfortable   HENT:  Head: Normocephalic and atraumatic.  Nose: Nose normal.  Mouth/Throat: Oropharynx is clear and moist. No oropharyngeal exudate.  Eyes: Conjunctivae and EOM are normal. Pupils are  equal, round, and reactive to light.  No scleral icterus No nystagmus   Neck: Normal range of motion. Neck supple. No tracheal deviation present.  No JVD  Cardiovascular: Normal rate, regular rhythm, normal heart sounds and intact distal pulses.   No murmur heard. Pulmonary/Chest: Effort normal and breath sounds normal. No respiratory distress. He has no wheezes. He has no rales.  Abdominal: He exhibits distension. There is tenderness. There is guarding.  +Large, distended, exquisitely tender abdomen +Abdominal pain with cough and shaking the bed +Abdominal exam concerning for peritonitis  Musculoskeletal: Normal range of motion. He exhibits no deformity.  Lymphadenopathy:    He  has no cervical adenopathy.  Neurological: He is alert and oriented to person, place, and time.  Patient alert and oriented to self, place and time +Asterixis bilaterally   Skin: Skin is warm and dry. Capillary refill takes less than 2 seconds. There is erythema.  Slight erythematous rash to periumbilical abdomen +Spider angioma in upper chest   Psychiatric: He has a normal mood and affect. His behavior is normal. Judgment and thought content normal.  Nursing note and vitals reviewed.    ED Treatments / Results  Labs (all labs ordered are listed, but only abnormal results are displayed) Labs Reviewed  CBC WITH DIFFERENTIAL/PLATELET - Abnormal; Notable for the following:       Result Value   RBC 2.96 (*)    Hemoglobin 9.2 (*)    HCT 27.2 (*)    Platelets 76 (*)    All other components within normal limits  COMPREHENSIVE METABOLIC PANEL - Abnormal; Notable for the following:    Sodium 128 (*)    Potassium 5.7 (*)    CO2 13 (*)    Glucose, Bld 120 (*)    BUN 55 (*)    Creatinine, Ser 2.27 (*)    Calcium 8.7 (*)    Total Protein 6.1 (*)    Albumin 3.2 (*)    AST 56 (*)    Total Bilirubin 2.6 (*)    GFR calc non Af Amer 29 (*)    GFR calc Af Amer 34 (*)    All other components within normal  limits  PROTIME-INR - Abnormal; Notable for the following:    Prothrombin Time 18.8 (*)    All other components within normal limits  I-STAT CG4 LACTIC ACID, ED - Abnormal; Notable for the following:    Lactic Acid, Venous 2.53 (*)    All other components within normal limits  CULTURE, BLOOD (ROUTINE X 2)  CULTURE, BLOOD (ROUTINE X 2)  LIPASE, BLOOD  APTT  PROCALCITONIN  AMMONIA  URINALYSIS, ROUTINE W REFLEX MICROSCOPIC  I-STAT CG4 LACTIC ACID, ED    EKG  EKG Interpretation None       Radiology Ct Abdomen Pelvis Wo Contrast  Result Date: 03/29/2017 CLINICAL DATA:  Abdominal pain.  Elevated lactic acid. EXAM: CT ABDOMEN AND PELVIS WITHOUT CONTRAST TECHNIQUE: Multidetector CT imaging of the abdomen and pelvis was performed following the standard protocol without IV contrast. COMPARISON:  11/19/2016 FINDINGS: Lower chest: Extensive coronary atherosclerotic calcification. No acute finding Hepatobiliary: Cirrhosis with splenomegaly and large volume ascites without concerning density or loculation.Cholecystectomy. Pancreas: Unremarkable. Spleen: Unremarkable. Adrenals/Urinary Tract: Negative adrenals. No hydronephrosis or ureteral calculus. Two right renal calculi measuring up to 6 mm. Unremarkable bladder. Stomach/Bowel: No obstruction. No suspected inflammation. Intermittent prominent thickness of the colonic wall is been seen previously and is likely from portal congestion. Negative for appendicitis. Vascular/Lymphatic: Atherosclerotic calcification. Focal enlargement of the abdominal aorta to 3.1 cm in diameter, unchanged over multiple studies. Venous type calcifications along the left renal and adrenal veins and in the gastrohepatic ligament. Reproductive:Nonspecific dystrophic calcifications in the prostate. Other: Small bilateral inguinal hernias containing fat on the left and ascites on the right. Musculoskeletal: No acute abnormalities. IMPRESSION: 1. Cirrhosis with large volume ascites.  2. Right nephrolithiasis and other chronic findings are stable and described above. Electronically Signed   By: Monte Fantasia M.D.   On: 03/29/2017 15:04   Dg Abdomen Acute W/chest  Result Date: 03/29/2017 CLINICAL DATA:  Nausea, vomiting and fever last night. History of cirrhosis. EXAM: DG  ABDOMEN ACUTE W/ 1V CHEST COMPARISON:  CT abdomen and pelvis 11/19/2016. Single-view of the chest 02/11/2017. FINDINGS: Single-view of the chest demonstrates clear lungs and normal heart size. No pneumothorax or pleural effusion. No free intraperitoneal air is identified. The bowel gas pattern is nonobstructive. Centralization of bowel loops is consistent with ascites. IMPRESSION: No acute abnormality. Findings compatible scratch the findings consistent with ascites. Electronically Signed   By: Inge Rise M.D.   On: 03/29/2017 14:10    Procedures Procedures (including critical care time)  Medications Ordered in ED Medications  piperacillin-tazobactam (ZOSYN) IVPB 3.375 g (not administered)  iopamidol (ISOVUE-300) 61 % injection (not administered)  sodium chloride 0.9 % bolus 1,000 mL (1,000 mLs Intravenous New Bag/Given 03/29/17 1133)  piperacillin-tazobactam (ZOSYN) IVPB 3.375 g (0 g Intravenous Stopped 03/29/17 1224)  fentaNYL (SUBLIMAZE) injection 25 mcg (25 mcg Intravenous Given 03/29/17 1149)     Initial Impression / Assessment and Plan / ED Course  I have reviewed the triage vital signs and the nursing notes.  Pertinent labs & imaging results that were available during my care of the patient were reviewed by me and considered in my medical decision making (see chart for details).  Clinical Course as of Mar 30 1527  Wed Mar 29, 2017  1442 FINDINGS: Single-view of the chest demonstrates clear lungs and normal heart size. No pneumothorax or pleural effusion.  No free intraperitoneal air is identified. The bowel gas pattern is nonobstructive. Centralization of bowel loops is consistent with  ascites. DG Abdomen Acute W/Chest [CG]  1517 IMPRESSION: 1. Cirrhosis with large volume ascites. 2. Right nephrolithiasis and other chronic findings are stable and described above. CT Abdomen Pelvis Wo Contrast [CG]  1522 Hemoglobin: (!) 9.2 [CG]  1522 Sodium: (!) 128 [CG]  1523 Potassium: (!) 5.7 [CG]  1523 Creatinine: (!) 2.27 [CG]  1523 Albumin: (!) 3.2 [CG]    Clinical Course User Index [CG] Kinnie Feil, PA-C    68 male h/o cryptogenic cirrhosis of liver c/b gastric variceal bleed and ascities requiring LVPs, chronic ITP is sent to ED from IR suite for concern for perforation vs. SBP.  Patient presents with worsening abdominal distention, abdominal pain, nausea, NBNB vomiting, diarrhea and erythematous rash to periumbilical abdomen.  Patient reports fever of 100.74F and "shakiness" at home yesterday.  Concerned for SBP or perforation.  Erythematous rash to periumbilical abdomen, ? Cellulitis. Patient did not take lactulose yesterday.   NS 1L IVF given. Fentanyl for pain. Zosyn started per intraabdominal suspected sepsis. Lab work remarkable for anemia hgb 9.2, hyponatremia 128, hyperkalemia 5.7, low albumin 3.2 and AKI with creatinine 2.27 (last documented creatinine 1.24 November 2016).  Acute abdominal series negative for perforation or obstructive bowel pattern,  CT a/p shows cirrhosis and large volume ascites but no acute intraabdominal or pelvic emergencies.  Will request admission for AKI.  Patient will likely need IR for LVP.    Patient, ED treatment and discharge plan was discussed with supervising physician who also evaluated the patient and is agreeable with plan.   Final Clinical Impressions(s) / ED Diagnoses   Final diagnoses:  Generalized abdominal pain  Abdominal distention    New Prescriptions New Prescriptions   No medications on file     Arlean Hopping 03/29/17 1528    Tegeler, Gwenyth Allegra, MD 04/01/17 [phone]

## 2017-03-29 NOTE — Progress Notes (Signed)
Vincent Ochoa presents today for his weekly paracentesis.  He is complaining of an acute onset of abdominal pain overnight with nausea and diarrhea.  He states his abdomen is more distended than normal and his abdominal wall is red and hot.    Abd: appears more distended than normal.  Diffuse tenderness to palpation with guarding, + peritoneal signs.  There is some erythema noted on the anterior inferior portion of his abdominal wall.  We have take Vincent Ochoa to the ED for evaluation as I do not want to proceed with a paracentesis in the setting of acute abdominal pain/peritonitis until he can be evaluate to determine a possible etiology.  He did receive 2 paracenteses last week.  He could have SBP, but would want to rule out other sources of peritonitis prior to fluid removal.  The patient and his wife were agreeable with this plan.  Leonia Heatherly E 10:04 AM 03/29/2017

## 2017-03-29 NOTE — Progress Notes (Signed)
Pharmacy Antibiotic Note  Vincent Ochoa is a 61 y.o. male admitted on 03/29/2017 with intra-abdominal.  Pharmacy has been consulted for zosyn dosing. PT is afebrile and WBC is WNL. Scr is elevated at 2.27 (baseline ~1). Lactic acid is elevated at 2.53.   Plan: Zosyn 3.375gm IV Q8H (4 hr inf) F/u renal fxn, C&S, clinical status   Height: 6\' 4"  (193 cm) Weight: 243 lb (110.2 kg) IBW/kg (Calculated) : 86.8  Temp (24hrs), Avg:97.7 F (36.5 C), Min:97.7 F (36.5 C), Max:97.7 F (36.5 C)   Recent Labs Lab 03/29/17 0953 03/29/17 1018  WBC 7.8  --   CREATININE 2.27*  --   LATICACIDVEN  --  2.53*    Estimated Creatinine Clearance: 46.5 mL/min (A) (by C-G formula based on SCr of 2.27 mg/dL (H)).    Allergies  Allergen Reactions  . Asa [Aspirin] Other (See Comments)    Other reaction(s): Other (See Comments) Previous hemmorhage Bleeding Previous hemmorhage  . Eliquis [Apixaban] Other (See Comments)    bleeding Potentiality of internal bleeding  . Nsaids Other (See Comments) and Anaphylaxis    May cause bleeding issues (per wife) May only have 1 plain Tylenol every 8 hours is needed for flu symptoms, per doctor.  . Plavix [Clopidogrel] Other (See Comments)    Bleeding Potentiality of internal bleeding  . Statins Other (See Comments)    Other reaction(s): Other (See Comments) Potentially may make patient bleed internally (per wife) Potentially may make patient bleed internally (per wife)  . Tolmetin     Other reaction(s): Other (See Comments) May cause bleeding issues (per wife) May only have 1 plain Tylenol every 8 hours is needed for flu symptoms, per doctor.    Antimicrobials this admission: Zosyn 5/9>>  Dose adjustments this admission: N/A  Microbiology results: Pending  Thank you for allowing pharmacy to be a part of this patient's care.  Emmerich Cryer, Rande Lawman 03/29/2017 11:18 AM

## 2017-03-29 NOTE — ED Notes (Signed)
Admitting MD at bedside.

## 2017-03-29 NOTE — ED Notes (Signed)
Patient to CT.

## 2017-03-29 NOTE — Progress Notes (Signed)
New Admission Note:    Arrival Method: Stretcher from ER Mental Orientation: Alert and Oriented x 4 Assessment:In progress Skin: In progress IV:20g RAC Pain:0/10 Safety Measures: SIde rails up x 2 and call bell in reach Admission: In progress 6E Orientation: Yes Family:  None  Orders have been reviewed and implemented.  Will continue to monitor the patient.

## 2017-03-29 NOTE — ED Triage Notes (Signed)
Patient comes over from IR in the hospital. Patient was here for a regular paracentesis that he receives about 1-2xs/week. Patient came in with c/o nausea and vomiting and fever since last night. PA was concerned for perforation and wanted him checked. Patient is a/ox4 at baseline, with some occasion forgetfulness/confusion at baseline r/t high ammonia levels according to family. Patient states pain in belly is worse, 10/10. abd is extremely distended, slightly more than usual. Patient states he's never had any perforation before. 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 BP low, 97/97. Patient's wife states his bp can run low.

## 2017-03-29 NOTE — H&P (Signed)
[facility] Admission History and Physical Service Pager: [phone]  Patient name: Vincent Ochoa Medical record number: [id number] Date of birth: Mar 25, 1956 Age: 61 y.o. Gender: male  Primary Care Provider: Bufford Lope, DO Consultants: IR Code Status: FULL (confirmed on admission)  Chief Complaint: abdominal pain  Assessment and Plan: Vincent Ochoa is a 61 y.o. male presenting with abdominal pain and distention. PMH is significant for cirrhosis of liver with recurrent ascites (followed by [facility] hepatology), CAD, psoriasis, thrombocytopenia (followed by Heme), OSA, knee pain, obesity, GERD, Depression, PAF, HTN, esophageal varices, anemia of chronic disease.   Abdominal distention: due to liver cirrhosis with recurrent ascites. Patient scheduled for weekly paracenteses. When he presented for his paracentesis today, staff was concerned with his degree of abdominal pain and sent him to the ED. Patient without fever or leukocytosis, but given frequent paracenteses, concern for SBP. Lactic elevated on admission to 2.53, however trended down.  Initial concern for bowel perforation, although CT abdomen and acute abdominal x-ray did not show this. Patient given Zosyn in the ED for abdominal prophylaxis. Patient reports he is on the transplant list.   -Admit to telemetry, Dr. Nori Riis attending -Vitals per floor protocol -Consult IR for large volume paracentesis -CBC, CMP, INR in am -reach out to [facility] hepatology in am to [facility] of his admission -change zosyn to ceftriaxone for SBP coverage -note, he is on daily cipro for SBP prophylaxis at home, this will need to be restarted when CTX is discontinued - Fentanyl 25 mcg every 6 hours as needed for pain  - Blood cultures pending  AKI: Baseline SCr 1.05; SCr 2.27 - concern for hepatorenal syndrome given significant vs. Overly rapid diuresis from recent hx of paracentesis  - consider getting urine Na as this would be low in  paracentesis  - continue trending - strict I and Os   Hypervolemic Hyponatremia: Na 128, signs of fluid overload. -Will expect improvement with paracentesis   Hyperkalemia: Potassium 5.7  - Will obtain EKG  - Will provide bowel regiment  - Cardiac monitoring   PAF:  On problem list.  Not on pharmacologic treatment currently. A.fib not present on ED EKG.  -Continue to monitor  GERD:  Stable.  At home on Protonix 40 mg po BID and sucralfate 1g PO TID.  -Continue home regimen  CHF:  Stable.  Echo 11/23/2016 with EF 50-55%.  No signs of edema or crackles. Lasix and spiro discontinued at last admission to [facility] 4/10-4/14.  -monitor on exam  CAD:  S/p Left heart cath July 2016 showing nonobstructive coronary artery disease.  Mid cx lesion previously treated with a bare metal stent in 10/2014.  Follows with Dr. Burt Knack outpatient. No HF medications per discharge summary from [facility] 4/14.  -no medications  Thrombocytopenia:  Stable.  On admission, plt count of 73.  At prior admission on 12/21, plt count of 47.  At home on Promacta 100 mg po daily.   -Continue home regimen  -Monitor  OSA:  Stable.  Uses CPAP at home.  -CPAP ordered  Depression:  Stable.  At home on Wellbutrin.   -Wellbutrin 150 mg po daily  FEN/GI: heart healthy/carb mod with fluid restriction Prophylaxis: heparin subq  Disposition: admit to tele  History of Present Illness:  Vincent Ochoa is a 61 y.o. male presenting with severe abdominal pain beginning today. He presented to IR suite for his weekly paracentesis, but staff are concerned about his abdominal pain and sent him  over to the ED. Vital signs stable except for baseline hypotension. Patient with significant abdominal distention, more than I have seen him have in the past. He notes that he had some episodes of vomiting yesterday, nonbloody. No vomiting today. Notes he has had several bowel movements today. Cannot recall all of his home medications. Tells me that  he is now on the transplant list [facility] for his liver. Denies chest pain or shortness of breath.  In the ED, CBC without leukocytosis, persistent baseline from cytopenia, INR 1.5. Initial concern was over possible bowel perforation vs acute abdomen, acute abdominal series and CT abdomen did not show this. Recurrent large-volume ascites demonstrated on imaging.   Review Of Systems: Per HPI with the following additions:   Review of Systems  Constitutional: Negative for chills, diaphoresis and fever.  Respiratory: Negative for shortness of breath.   Cardiovascular: Negative for chest pain and palpitations.  Gastrointestinal: Positive for vomiting.  Musculoskeletal: Negative for myalgias.  Skin: Positive for rash.  Neurological: Negative for dizziness.    Patient Active Problem List   Diagnosis Date Noted  . Anemia due to chronic illness 12/01/2016  . Hematuria   . SBP (spontaneous bacterial peritonitis) ([place]) 11/19/2016  . Hematuria, microscopic 10/18/2016  . Rectal bleeding 09/26/2016  . Nephrolithiasis 09/15/2016  . Chronic ITP (idiopathic thrombocytopenia) (HCC) 09/15/2016  . Varices, esophageal ([place]) 09/15/2016  . Essential hypertension   . Hematemesis 06/21/2016  . Chronic combined systolic and diastolic congestive heart failure ([place])   . Erectile dysfunction 06/03/2016  . Cirrhosis of liver with ascites ([place]) 02/25/2016  . Portal vein thrombosis   . Ascites 02/16/2016  . Alcoholic cirrhosis of liver with ascites ([place])   . Diastolic heart failure ([place]) 10/07/2015  . Leukopenia 09/15/2015  . Acrochordon 07/18/2015  . PAF (paroxysmal atrial fibrillation) ([place])   . Obese   . Knee pain, bilateral 03/05/2015  . Depression 01/08/2015  . OSA (obstructive sleep apnea)   . NSTEMI (non-ST elevated myocardial infarction) ([place])   . Back pain, lumbosacral 01/04/2013  . Thrombocytopenia ([place]) 11/29/2012  . GERD (gastroesophageal reflux disease) 08/15/2011  . Coronary artery  disease involving native coronary artery of native heart without angina pectoris 07/29/2011  . Psoriasis 07/29/2011    Past Medical History: Past Medical History:  Diagnosis Date  . Arthritis    "hands, knees, back" (07/05/2016)  . Back pain, lumbosacral 2014  . CHF (congestive heart failure) ([place])   . Cirrhosis ([place])   . Coronary artery disease    a. Evaluated by Dr. Stanford Breed 2012 - nonobstructive 2007-2008 by cath. b. Abnormal stress test 10/2014 but cath deferred temporarily due to thrombocytopenia. c. 10/2014: readm with AF-RVR/NSTEMI - s/p BMS to mLCx 11/04/14 (mod dz in RCA);  d. relook cath 01/2015 and 06/02/2015 patent stent, nonobs CAD, EF 55-65%.  . Elevated LFTs   . Facial cellulitis 2014   Periorbital  . Fatty liver US - 2012  . Frequent headaches   . GERD (gastroesophageal reflux disease)   . History of blood transfusion 01/2016   "lost alot of blood vomiting"  . Hyperlipidemia   . Morbid obesity ([place])   . Obesity   . OSA on CPAP    severe with AHI 56/hr with successful CPAP titration to 18cm H2O  . PAF (paroxysmal atrial fibrillation) ([place])    a. Episode 10/2014 at time of NSTEMI, spont converted to NSR. Observation for further episodes (not on anticoag at present time due to [facility] = 1, thrombocytopenia).  Marland Kitchen  Psoriasis   . Sinus bradycardia   . Stroke [facility])    "I've been told I'd had a mini stroke on my left side" (07/05/2016)  . Thrombocytopenia ([place])    a. Onset unclear, but noted 2012. ? 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 s/p prednisone, IVIG.    Past Surgical History: Past Surgical History:  Procedure Laterality Date  . CARDIAC CATHETERIZATION    . CARDIAC CATHETERIZATION N/A 06/02/2015   Procedure: Left Heart Cath and Coronary Angiography;  Surgeon: Jettie Booze, MD;  Location: [place] CV LAB;  Service: Cardiovascular;  Laterality: N/A;  . COLONOSCOPY WITH PROPOFOL N/A 09/26/2016   Procedure: COLONOSCOPY WITH  PROPOFOL;  Surgeon: Wilford Corner, MD;  Location: [facility] ENDOSCOPY;  Service: Endoscopy;  Laterality: N/A;  . ESOPHAGOGASTRODUODENOSCOPY N/A 06/22/2016   Procedure: ESOPHAGOGASTRODUODENOSCOPY (EGD);  Surgeon: Clarene Essex, MD;  Location: [facility] ENDOSCOPY;  Service: Endoscopy;  Laterality: N/A;  . ESOPHAGOGASTRODUODENOSCOPY N/A 11/24/2016   Procedure: ESOPHAGOGASTRODUODENOSCOPY (EGD);  Surgeon: Clarene Essex, MD;  Location: [facility] ENDOSCOPY;  Service: Endoscopy;  Laterality: N/A;  . ESOPHAGOGASTRODUODENOSCOPY (EGD) WITH PROPOFOL N/A 08/17/2016   Procedure: ESOPHAGOGASTRODUODENOSCOPY (EGD) WITH PROPOFOL;  Surgeon: Otis Brace, MD;  Location: [place];  Service: Gastroenterology;  Laterality: N/A;  . FACIAL COSMETIC SURGERY  1990s   "rodeo-related injury"  . IR GENERIC HISTORICAL  09/14/2016   IR RADIOLOGIST EVAL & MGMT 09/14/2016 Sandi Mariscal, MD [facility] INTERV RAD  . IR GENERIC HISTORICAL  12/15/2016   IR RADIOLOGIST EVAL & MGMT 12/15/2016 Sandi Mariscal, MD [facility] INTERV RAD  . IR GENERIC HISTORICAL  02/09/2017   IR PARACENTESIS 02/09/2017 Saverio Danker, PA-C [facility]INTERV RAD  . IR PARACENTESIS  03/08/2017  . IR PARACENTESIS  03/14/2017  . IR PARACENTESIS  03/21/2017  . IR PARACENTESIS  03/24/2017  . KNEE ARTHROPLASTY Left 1970's   "put cartilage in it"  . LAPAROSCOPIC CHOLECYSTECTOMY  2007  . LEFT HEART CATHETERIZATION WITH CORONARY ANGIOGRAM N/A 11/04/2014   Procedure: LEFT HEART CATHETERIZATION WITH CORONARY ANGIOGRAM;  Surgeon: Jettie Booze, MD;  Location: [facility] CATH LAB;  Service: Cardiovascular;  Laterality: N/A;  . LEFT HEART CATHETERIZATION WITH CORONARY ANGIOGRAM N/A 01/28/2015   Procedure: LEFT HEART CATHETERIZATION WITH CORONARY ANGIOGRAM;  Surgeon: Sherren Mocha, MD;  Location: [facility] CATH LAB;  Service: Cardiovascular;  Laterality: N/A;  . liver cirrhosis    . RADIOLOGY WITH ANESTHESIA N/A 02/04/2016   Procedure: RADIOLOGY WITH ANESTHESIA;  Surgeon: Medication Radiologist, MD;  Location: [place];  Service:  Radiology;  Laterality: N/A;  . TONSILLECTOMY  1960's    Social History: Social History  Substance Use Topics  . Smoking status: Former Smoker    Packs/day: 1.50    Years: 43.00    Types: Cigarettes    Quit date: 02/19/2016  . Smokeless tobacco: Former Systems developer  . Alcohol use 0.0 oz/week     Comment: 07/05/2016 "mostly stopped in 1979; might have a beer q 6 months or so"   Additional social hist: No alcohol, no tobacco. Lives with his wife and brother-in-law. Please also refer to relevant sections of EMR.  Family History: Family History  Problem Relation Age of Onset  . Cirrhosis Mother   . Heart attack Mother   . Hypertension Mother   . Hypertension Father   . Stroke Father   . Arthritis Maternal Grandmother   . Heart disease Sister   . Diabetes Sister   . Ovarian cancer Sister   . Heart disease Other  Maternal/paternal grandparents  . Heart attack Maternal Aunt     Allergies and Medications: Allergies  Allergen Reactions  . Asa [Aspirin] Other (See Comments)    Other reaction(s): Other (See Comments) Previous hemmorhage Bleeding Previous hemmorhage  . Eliquis [Apixaban] Other (See Comments)    bleeding Potentiality of internal bleeding  . Nsaids Other (See Comments) and Anaphylaxis    May cause bleeding issues (per wife) May only have 1 plain Tylenol every 8 hours is needed for flu symptoms, per doctor.  . Plavix [Clopidogrel] Other (See Comments)    Bleeding Potentiality of internal bleeding  . Statins Other (See Comments)    Other reaction(s): Other (See Comments) Potentially may make patient bleed internally (per wife) Potentially may make patient bleed internally (per wife)  . Tolmetin     Other reaction(s): Other (See Comments) May cause bleeding issues (per wife) May only have 1 plain Tylenol every 8 hours is needed for flu symptoms, per doctor.   No current facility-administered medications on file prior to encounter.    Current Outpatient  Prescriptions on File Prior to Encounter  Medication Sig Dispense Refill  . buPROPion (WELLBUTRIN XL) 150 MG 24 hr tablet Take 1 tablet (150 mg total) by mouth every other day. (Patient taking differently: Take 150 mg by mouth daily. ) 30 tablet 2  . ciprofloxacin (CIPRO) 500 MG tablet Take 500 mg by mouth daily.    . Lactulose 20 GM/30ML SOLN Take 45 mLs (30 g total) by mouth 2 (two) times daily. 473 mL 3  . Multiple Vitamins-Minerals (MULTIVITAMIN GUMMIES ADULT) CHEW Chew 1 tablet by mouth daily. 30 tablet 11  . nitroGLYCERIN (NITROSTAT) 0.4 MG SL tablet Place 1 tablet (0.4 mg total) under the tongue every 5 (five) minutes as needed for chest pain (up to 3 doses). 25 tablet 3  . pantoprazole (PROTONIX) 40 MG tablet Take 1 tablet (40 mg total) by mouth 2 (two) times daily. 60 tablet 2  . sucralfate (CARAFATE) 1 g tablet Take 1 g by mouth 3 (three) times daily as needed (stomach irritation).    Marland Kitchen atorvastatin (LIPITOR) 40 MG tablet TAKE ONE (1) TABLET EACH DAY AT 8PM (Patient not taking: Reported on 03/29/2017) 30 tablet 3  . eltrombopag (PROMACTA) 50 MG tablet Take 1 tablet (50 mg total) by mouth daily. Take on an empty stomach 1 hour before a meal or 2 hours after (Patient not taking: Reported on 03/29/2017) 30 tablet 11    Objective: BP 110/64 (BP Location: Left Arm)   Pulse 88   Temp 98 F (36.7 C) (Oral)   Resp 18   Ht 6\' 4"  (1.93 m)   Wt 243 lb (110.2 kg)   SpO2 100%   BMI 29.58 kg/m  Exam: GeneIll-appearing male lying in bed in no acute distress. Eyes: EOMI, PEERLA,  ENTM: MMM, poor dentition Neck: supple, no LAD  Cardiovascular: RRR, no murmur Respiratory: CTAB, easy WOB, on [place]  Gastrointestinal: Distended, easily appreciable bowel sounds in all quadrants, diffusely tender to light touch, erythematous rash over lower half of the abdomen MSK: normal muscle tone and bulk Derm: erythematous rash over lower abdomen Neuro: CN II-XII grossly intact Psych: baseline melancholy mood,  appropriate affect  Labs and Imaging: CBC BMET   Recent Labs Lab 03/29/17 0953  WBC 7.8  HGB 9.2*  HCT 27.2*  PLT 76*    Recent Labs Lab 03/29/17 0953  NA 128*  K 5.7*  CL 107  CO2 13*  BUN 55*  CREATININE 2.27*  GLUCOSE 120*  CALCIUM 8.7*     INR 1.5  Sela Hilding, MD 03/29/2017, 7:07 PM PGY-1, [place] [facility] pager: [phone], text pages welcome  UPPER LEVEL ADDENDUM  I have read the above note and made revisions highlighted in blue.  Kerrin Mo, MD, PGY-2 [facility]

## 2017-03-29 NOTE — ED Notes (Signed)
Jadavion Spoelstra (wife) # cell: [phone], work: [phone]

## 2017-03-30 ENCOUNTER — Inpatient Hospital Stay (HOSPITAL_COMMUNITY): Payer: Medicare HMO

## 2017-03-30 ENCOUNTER — Ambulatory Visit (HOSPITAL_COMMUNITY): Payer: Medicaid Other

## 2017-03-30 ENCOUNTER — Encounter (HOSPITAL_COMMUNITY): Payer: Self-pay | Admitting: General Surgery

## 2017-03-30 HISTORY — PX: IR PARACENTESIS: IMG2679

## 2017-03-30 LAB — COMPREHENSIVE METABOLIC PANEL
ALT: 40 U/L (ref 17–63)
ANION GAP: 7 (ref 5–15)
AST: 45 U/L — ABNORMAL HIGH (ref 15–41)
Albumin: 2.9 g/dL — ABNORMAL LOW (ref 3.5–5.0)
Alkaline Phosphatase: 98 U/L (ref 38–126)
BUN: 53 mg/dL — ABNORMAL HIGH (ref 6–20)
CHLORIDE: 107 mmol/L (ref 101–111)
CO2: 16 mmol/L — ABNORMAL LOW (ref 22–32)
CREATININE: 2.16 mg/dL — AB (ref 0.61–1.24)
Calcium: 8.6 mg/dL — ABNORMAL LOW (ref 8.9–10.3)
GFR, EST AFRICAN AMERICAN: 36 mL/min — AB (ref >60)
GFR, EST NON AFRICAN AMERICAN: 31 mL/min — AB (ref >60)
Glucose, Bld: 99 mg/dL (ref 65–99)
POTASSIUM: 5.6 mmol/L — AB (ref 3.5–5.1)
Sodium: 130 mmol/L — ABNORMAL LOW (ref 135–145)
Total Bilirubin: 2.5 mg/dL — ABNORMAL HIGH (ref 0.3–1.2)
Total Protein: 5.9 g/dL — ABNORMAL LOW (ref 6.5–8.1)

## 2017-03-30 LAB — LACTATE DEHYDROGENASE, PLEURAL OR PERITONEAL FLUID: LD, Fluid: 49 U/L — ABNORMAL HIGH (ref 3–23)

## 2017-03-30 LAB — ALBUMIN, PLEURAL OR PERITONEAL FLUID: Albumin, Fluid: <1 g/dL

## 2017-03-30 LAB — BODY FLUID CELL COUNT WITH DIFFERENTIAL
Lymphs, Fluid: 3 %
Monocyte-Macrophage-Serous Fluid: 36 % — ABNORMAL LOW (ref 50–90)
Neutrophil Count, Fluid: 61 % — ABNORMAL HIGH (ref 0–25)
WBC FLUID: 630 uL (ref 0–1000)

## 2017-03-30 LAB — BASIC METABOLIC PANEL
Anion gap: 7 (ref 5–15)
Anion gap: 9 (ref 5–15)
BUN: 53 mg/dL — AB (ref 6–20)
BUN: 53 mg/dL — AB (ref 6–20)
CALCIUM: 8.6 mg/dL — AB (ref 8.9–10.3)
CHLORIDE: 107 mmol/L (ref 101–111)
CO2: 14 mmol/L — ABNORMAL LOW (ref 22–32)
CO2: 15 mmol/L — ABNORMAL LOW (ref 22–32)
CREATININE: 2.13 mg/dL — AB (ref 0.61–1.24)
Calcium: 8.5 mg/dL — ABNORMAL LOW (ref 8.9–10.3)
Chloride: 104 mmol/L (ref 101–111)
Creatinine, Ser: 2.12 mg/dL — ABNORMAL HIGH (ref 0.61–1.24)
GFR calc Af Amer: 37 mL/min — ABNORMAL LOW (ref >60)
GFR calc non Af Amer: 32 mL/min — ABNORMAL LOW (ref >60)
GFR, EST AFRICAN AMERICAN: 37 mL/min — AB (ref >60)
GFR, EST NON AFRICAN AMERICAN: 32 mL/min — AB (ref >60)
GLUCOSE: 122 mg/dL — AB (ref 65–99)
Glucose, Bld: 102 mg/dL — ABNORMAL HIGH (ref 65–99)
Potassium: 5.5 mmol/L — ABNORMAL HIGH (ref 3.5–5.1)
Potassium: 5.8 mmol/L — ABNORMAL HIGH (ref 3.5–5.1)
Sodium: 128 mmol/L — ABNORMAL LOW (ref 135–145)
Sodium: 128 mmol/L — ABNORMAL LOW (ref 135–145)

## 2017-03-30 LAB — CBC
HCT: 26.9 % — ABNORMAL LOW (ref 39.0–52.0)
HEMOGLOBIN: 9.2 g/dL — AB (ref 13.0–17.0)
MCH: 31.4 pg (ref 26.0–34.0)
MCHC: 34.2 g/dL (ref 30.0–36.0)
MCV: 91.8 fL (ref 78.0–100.0)
PLATELETS: 71 10*3/uL — AB (ref 150–400)
RBC: 2.93 MIL/uL — AB (ref 4.22–5.81)
RDW: 14 % (ref 11.5–15.5)
WBC: 7 10*3/uL (ref 4.0–10.5)

## 2017-03-30 LAB — GRAM STAIN

## 2017-03-30 LAB — PROTIME-INR
INR: 1.6
Prothrombin Time: 19.2 seconds — ABNORMAL HIGH (ref 11.4–15.2)

## 2017-03-30 LAB — MAGNESIUM: MAGNESIUM: 2 mg/dL (ref 1.7–2.4)

## 2017-03-30 LAB — PHOSPHORUS: PHOSPHORUS: 4.4 mg/dL (ref 2.5–4.6)

## 2017-03-30 LAB — HIV ANTIBODY (ROUTINE TESTING W REFLEX): HIV SCREEN 4TH GENERATION: NONREACTIVE

## 2017-03-30 LAB — MRSA PCR SCREENING: MRSA by PCR: NEGATIVE

## 2017-03-30 MED ORDER — SODIUM POLYSTYRENE SULFONATE 15 GM/60ML PO SUSP
15.0000 g | Freq: Once | ORAL | Status: AC
  Administered 2017-03-30: 15 g via ORAL
  Filled 2017-03-30: qty 60

## 2017-03-30 MED ORDER — ALBUMIN HUMAN 25 % IV SOLN
25.0000 g | Freq: Once | INTRAVENOUS | Status: AC
  Administered 2017-03-30: 25 g via INTRAVENOUS
  Filled 2017-03-30: qty 100

## 2017-03-30 MED ORDER — LIDOCAINE HCL 1 % IJ SOLN
INTRAMUSCULAR | Status: AC
  Filled 2017-03-30: qty 20

## 2017-03-30 MED ORDER — LIDOCAINE HCL 1 % IJ SOLN
INTRAMUSCULAR | Status: DC | PRN
  Administered 2017-03-30: 10 mL

## 2017-03-30 NOTE — Progress Notes (Signed)
Family Medicine Teaching Service Daily Progress Note Intern Pager: [phone]  Patient name: Vincent Ochoa Medical record number: [id number] Date of birth: 05-10-1956 Age: 61 y.o. Gender: male  Primary Care Provider: Bufford Lope, DO Consultants: [place] [facility] Code Status: FULL  Pt Overview and Major Events to Date:  5/9 admitted with abdominal pain and ascites   Assessment and Plan: Vincent Ochoa is a 61 y.o. male presenting with abdominal pain and distention. PMH is significant for cirrhosis of liver with recurrent ascites (followed by [facility] hepatology), CAD, psoriasis, thrombocytopenia (followed by [facility]), OSA, knee pain, obesity, GERD, Depression, PAF, HTN, esophageal varices, anemia of chronic disease.   Abdominal distention 2/2 liver cirrhosis: due to cryptogenic liver cirrhosis with recurrent ascites. MELD score 27 (19.6% 3 month mortality). Follows with [facility] hepatology, who I have reached out to today.  -Consult IR for large volume paracentesis (max 8L, will likely need repeat tomorrow) -continue ceftriaxone  -note, he is on daily cipro for SBP prophylaxis at home, this will need to be restarted when CTX is discontinued - Fentanyl 25 mcg every 6 hours as needed for pain  - Blood cultures pending  AKI: Baseline SCr 1.05 in our system, however Cr has been trending up slowly since February in [place] on most recent discharge 4/14 was 2.1; SCr 2.27 - concern for hepatorenal syndrome given significant vs. Overly rapid diuresis from recent hx of paracentesis. Recent FeNa at [facility] suggestive of prerenal etiology. 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 - continue trending Cr - strict I and Os   Hypervolemic Hyponatremia: Na 128, signs of fluid overload. -Will expect improvement with paracentesis  -repeat BMP this afternoon   Hyperkalemia: Potassium 5.6 despite 30g Kayexalate.  - Cardiac monitoring  - continue home lactulose -repeat BMP this afternoon  PAF: On problem list. Not on  pharmacologic treatment currently. A.fib not present on ED EKG. -Continue to monitor  GERD:Stable. At home on Protonix 40 mg po BID and sucralfate 1g PO TID.  -Continue home regimen  OBS:JGGEZM. Echo 11/23/2016 with EF 50-55%. No signs of edema or crackles. Lasix and spiro discontinued at last admission to [facility] 4/10-4/14.  -monitor on exam  CAD: S/p Left heart cath July 2016 showing nonobstructive coronary artery disease. Mid cx lesion previously treated with a bare metal stent in 10/2014. Follows with Dr. Burt Knack outpatient. No HF medications per discharge summary from [facility] 4/14.  -no medications  Thrombocytopenia:Stable. On admission, plt count of 73. At prior admission on 12/21, plt count of 47. At home on Promacta 100 mg po daily.  -Continue home regimen  -Monitor  OQH:UTMLYY. Uses CPAP at home.  -CPAP ordered  Depression: Stable. At home on Wellbutrin.  -Wellbutrin 150 mg po daily  FEN/GI: heart healthy/carb mod with fluid restriction Prophylaxis: heparin subq  Disposition: continued inpatient management   Subjective:  Patient disappointed with episode of fecal incontinence overnight. He feels about the same as yesterday in terms of abdominal pain. He appreciates our care. He says his transplant coordinator arrives in her office around 9am so she may call us back later today. He states he did not pick up his midodrine because it was $197 per month.    Objective: Temp:  [97.7 F (36.5 C)-98.1 F (36.7 C)] 98.1 F (36.7 C) (05/10 0559) Pulse Rate:  [83-95] 95 (05/10 0559) Resp:  [16-18] 18 (05/10 0559) BP: (84-121)/(43-96) 100/60 (05/10 0559) SpO2:  [98 %-100 %] 100 % (05/10 0559) Weight:  [242 lb 15.2 oz (  110.2 kg)-243 lb (110.2 kg)] 242 lb 15.2 oz (110.2 kg) (05/09 2123) Physical Exam: General: Ill appearing male resting in chair eating breakfast.  Cardiovascular: RRR, no murmur Respiratory: CTAB, easy WOB Abdomen: distended, taut, diffusely  tender to light touch, +BS Extremities: no edema, warm  Laboratory:  Recent Labs Lab 03/29/17 0953 03/30/17 0528  WBC 7.8 7.0  HGB 9.2* 9.2*  HCT 27.2* 26.9*  PLT 76* 71*    Recent Labs Lab 03/29/17 0953 03/29/17 2351 03/30/17 0528  NA 128* 128* 130*  K 5.7* 5.8* 5.6*  CL 107 107 107  CO2 13* 14* 16*  BUN 55* 53* 53*  CREATININE 2.27* 2.13* 2.16*  CALCIUM 8.7* 8.5* 8.6*  PROT 6.1*  --  5.9*  BILITOT 2.6*  --  2.5*  ALKPHOS 107  --  98  ALT 45  --  40  AST 56*  --  45*  GLUCOSE 120* 102* 99   INR 1.6.  Imaging/Diagnostic Tests: Ct Abdomen Pelvis Wo Contrast  Result Date: 03/29/2017 CLINICAL DATA:  Abdominal pain.  Elevated lactic acid. EXAM: CT ABDOMEN AND PELVIS WITHOUT CONTRAST TECHNIQUE: Multidetector CT imaging of the abdomen and pelvis was performed following the standard protocol without IV contrast. COMPARISON:  11/19/2016 FINDINGS: Lower chest: Extensive coronary atherosclerotic calcification. No acute finding Hepatobiliary: Cirrhosis with splenomegaly and large volume ascites without concerning density or loculation.Cholecystectomy. Pancreas: Unremarkable. Spleen: Unremarkable. Adrenals/Urinary Tract: Negative adrenals. No hydronephrosis or ureteral calculus. Two right renal calculi measuring up to 6 mm. Unremarkable bladder. Stomach/Bowel: No obstruction. No suspected inflammation. Intermittent prominent thickness of the colonic wall is been seen previously and is likely from portal congestion. Negative for appendicitis. Vascular/Lymphatic: Atherosclerotic calcification. Focal enlargement of the abdominal aorta to 3.1 cm in diameter, unchanged over multiple studies. Venous type calcifications along the left renal and adrenal veins and in the gastrohepatic ligament. Reproductive:Nonspecific dystrophic calcifications in the prostate. Other: Small bilateral inguinal hernias containing fat on the left and ascites on the right. Musculoskeletal: No acute abnormalities.  IMPRESSION: 1. Cirrhosis with large volume ascites. 2. Right nephrolithiasis and other chronic findings are stable and described above. Electronically Signed   By: Monte Fantasia M.D.   On: 03/29/2017 15:04   Dg Abdomen Acute W/chest  Result Date: 03/29/2017 CLINICAL DATA:  Nausea, vomiting and fever last night. History of cirrhosis. EXAM: DG ABDOMEN ACUTE W/ 1V CHEST COMPARISON:  CT abdomen and pelvis 11/19/2016. Single-view of the chest 02/11/2017. FINDINGS: Single-view of the chest demonstrates clear lungs and normal heart size. No pneumothorax or pleural effusion. No free intraperitoneal air is identified. The bowel gas pattern is nonobstructive. Centralization of bowel loops is consistent with ascites. IMPRESSION: No acute abnormality. Findings compatible scratch the findings consistent with ascites. Electronically Signed   By: Inge Rise M.D.   On: 03/29/2017 14:10   Ir Paracentesis  Result Date: 03/24/2017 INDICATION: Patient with a history of cirrhosis and recurrent abdominal ascites. Request is made for therapeutic paracentesis today with a maximum of 5 L. He will receive albumin after the procedure as well. EXAM: ULTRASOUND GUIDED THERAPEUTIC PARACENTESIS MEDICATIONS: 1% lidocaine COMPLICATIONS: None immediate. PROCEDURE: Informed written consent was obtained from the patient after a discussion of the risks, benefits and alternatives to treatment. A timeout was performed prior to the initiation of the procedure. Initial ultrasound scanning demonstrates a large amount of ascites within the right lower abdominal quadrant. The right lower abdomen was prepped and draped in the usual sterile fashion. 1% lidocaine was used for local anesthesia.  Following this, a 19 gauge, 7-cm, Yueh catheter was introduced. An ultrasound image was saved for documentation purposes. The paracentesis was performed. The catheter was removed and a dressing was applied. The patient tolerated the procedure well without  immediate post procedural complication. FINDINGS: A total of approximately 5 L of yellow fluid was removed. IMPRESSION: Successful ultrasound-guided paracentesis yielding 5 liters of peritoneal fluid. Read by: Saverio Danker, PA-C CLINICAL DATA:  Shin Electronically Signed   By: Aletta Edouard M.D.   On: 03/24/2017 13:07   Ir Paracentesis  Result Date: 03/21/2017 INDICATION: Patient with history of ascites. Request is made for diagnostic and therapeutic paracentesis. EXAM: ULTRASOUND GUIDED DIAGNOSTIC AND THERAPEUTIC PARACENTESIS MEDICATIONS: 10 mL 1% lidocaine COMPLICATIONS: None immediate. PROCEDURE: Informed written consent was obtained from the patient after a discussion of the risks, benefits and alternatives to treatment. A timeout was performed prior to the initiation of the procedure. Initial ultrasound scanning demonstrates a large amount of ascites within the right lateral abdomen. The right lateral abdomen was prepped and draped in the usual sterile fashion. 1% lidocaine was used for local anesthesia. Following this, a 19 gauge, 7-cm, Yueh catheter was introduced. An ultrasound image was saved for documentation purposes. The paracentesis was performed. The catheter was removed and a dressing was applied. The patient tolerated the procedure well without immediate post procedural complication. FINDINGS: A total of approximately 7.0 liters of clear, yellow fluid was removed. Samples were sent to the laboratory as requested by the clinical team. IMPRESSION: Successful ultrasound-guided diagnostic and therapeutic paracentesis yielding 7.0 liters of peritoneal fluid. Patient is to receive albumin post-procedure. Read by:  Brynda Greathouse PA-C Electronically Signed   By: Corrie Mckusick D.O.   On: 03/21/2017 16:25   Ir Paracentesis  Result Date: 03/14/2017 INDICATION: Cirrhosis with a history of recurrent abdominal ascites. Request is made for diagnostic and therapeutic paracentesis. A maximum of 10 L is  given. EXAM: ULTRASOUND GUIDED DIAGNOSTIC AND THERAPEUTIC PARACENTESIS MEDICATIONS: 1% lidocaine COMPLICATIONS: None immediate. PROCEDURE: Informed written consent was obtained from the patient after a discussion of the risks, benefits and alternatives to treatment. A timeout was performed prior to the initiation of the procedure. Initial ultrasound scanning demonstrates a large amount of ascites within the right lower abdominal quadrant. The right lower abdomen was prepped and draped in the usual sterile fashion. 1% lidocaine was used for local anesthesia. Following this, a 19 gauge, 7-cm, Yueh catheter was introduced. An ultrasound image was saved for documentation purposes. The paracentesis was performed. The catheter was removed and a dressing was applied. The patient tolerated the procedure well without immediate post procedural complication. FINDINGS: A total of approximately 10 L of serous fluid was removed. IMPRESSION: Successful ultrasound-guided paracentesis yielding 10 liters of peritoneal fluid. Read by: Saverio Danker, PA-C Electronically Signed   By: Corrie Mckusick D.O.   On: 03/14/2017 11:00   Ir Paracentesis  Result Date: 03/08/2017 INDICATION: Patient with recurrent ascites. Request is made for diagnostic and therapeutic paracentesis. Patient has a 7 liter maximum and is to receive albumin post-procedure. EXAM: ULTRASOUND GUIDED DIAGNOSTIC AND THERAPEUTIC PARACENTESIS MEDICATIONS: 10 mL 1% lidocaine COMPLICATIONS: None immediate. PROCEDURE: Informed written consent was obtained from the patient after a discussion of the risks, benefits and alternatives to treatment. A timeout was performed prior to the initiation of the procedure. Initial ultrasound scanning demonstrates a large amount of ascites within the left lateral abdomen. The left lateral abdomen was prepped and draped in the usual sterile fashion. 1% lidocaine was  used for local anesthesia. Following this, a 19 gauge, 7-cm, Yueh catheter  was introduced. An ultrasound image was saved for documentation purposes. The paracentesis was performed. The catheter was removed and a dressing was applied. The patient tolerated the procedure well without immediate post procedural complication. FINDINGS: A total of approximately 7.0 liters of clear, yellow fluid was removed. Samples were sent to the laboratory as requested by the clinical team. IMPRESSION: Successful ultrasound-guided paracentesis yielding 7.0 liters of peritoneal fluid. Patient is to receive albumin post-procedure. Read by:  Brynda Greathouse PA-C Electronically Signed   By: Marybelle Killings M.D.   On: 03/08/2017 13:37    Sela Hilding, MD 03/30/2017, 8:21 AM PGY-1, [place] Intern pager: [phone], text pages welcome

## 2017-03-30 NOTE — Procedures (Signed)
Ultrasound-guided diagnostic and therapeutic paracentesis performed yielding 7 liters of slightly cloudy yellow colored fluid. No immediate complications.   Vincent Ochoa E 3:34 PM 03/30/2017

## 2017-03-30 NOTE — Progress Notes (Signed)
FPTS Social Note for PCP  Stopped by to socially visit with patient who was sleeping comfortably at that time. Appreciate excellent care from inpatient team. Will attempt to visit at a later time.  Bufford Lope, DO 03/30/2017, 3:09 PM PGY-1, [place] [facility] Service pager [phone]

## 2017-03-31 ENCOUNTER — Encounter (HOSPITAL_COMMUNITY): Payer: Self-pay | Admitting: Interventional Radiology

## 2017-03-31 ENCOUNTER — Inpatient Hospital Stay (HOSPITAL_COMMUNITY): Payer: Medicare HMO

## 2017-03-31 DIAGNOSIS — K652 Spontaneous bacterial peritonitis: Principal | ICD-10-CM

## 2017-03-31 HISTORY — PX: IR PARACENTESIS: IMG2679

## 2017-03-31 LAB — COMPREHENSIVE METABOLIC PANEL
ALT: 33 U/L (ref 17–63)
AST: 36 U/L (ref 15–41)
Albumin: 2.8 g/dL — ABNORMAL LOW (ref 3.5–5.0)
Alkaline Phosphatase: 90 U/L (ref 38–126)
Anion gap: 9 (ref 5–15)
BILIRUBIN TOTAL: 1.1 mg/dL (ref 0.3–1.2)
BUN: 53 mg/dL — AB (ref 6–20)
CO2: 13 mmol/L — ABNORMAL LOW (ref 22–32)
Calcium: 8.3 mg/dL — ABNORMAL LOW (ref 8.9–10.3)
Chloride: 105 mmol/L (ref 101–111)
Creatinine, Ser: 2.19 mg/dL — ABNORMAL HIGH (ref 0.61–1.24)
GFR calc Af Amer: 36 mL/min — ABNORMAL LOW (ref >60)
GFR, EST NON AFRICAN AMERICAN: 31 mL/min — AB (ref >60)
Glucose, Bld: 134 mg/dL — ABNORMAL HIGH (ref 65–99)
POTASSIUM: 4.8 mmol/L (ref 3.5–5.1)
Sodium: 127 mmol/L — ABNORMAL LOW (ref 135–145)
TOTAL PROTEIN: 5.7 g/dL — AB (ref 6.5–8.1)

## 2017-03-31 LAB — LACTATE DEHYDROGENASE, PLEURAL OR PERITONEAL FLUID: LD, Fluid: 72 U/L — ABNORMAL HIGH (ref 3–23)

## 2017-03-31 LAB — BASIC METABOLIC PANEL
Anion gap: 7 (ref 5–15)
BUN: 52 mg/dL — ABNORMAL HIGH (ref 6–20)
CO2: 15 mmol/L — ABNORMAL LOW (ref 22–32)
Calcium: 8.4 mg/dL — ABNORMAL LOW (ref 8.9–10.3)
Chloride: 104 mmol/L (ref 101–111)
Creatinine, Ser: 2.05 mg/dL — ABNORMAL HIGH (ref 0.61–1.24)
GFR calc Af Amer: 39 mL/min — ABNORMAL LOW (ref >60)
GFR calc non Af Amer: 33 mL/min — ABNORMAL LOW (ref >60)
GLUCOSE: 123 mg/dL — AB (ref 65–99)
Potassium: 4.7 mmol/L (ref 3.5–5.1)
SODIUM: 126 mmol/L — AB (ref 135–145)

## 2017-03-31 LAB — GRAM STAIN

## 2017-03-31 LAB — CBC
HEMATOCRIT: 25.2 % — AB (ref 39.0–52.0)
Hemoglobin: 8.6 g/dL — ABNORMAL LOW (ref 13.0–17.0)
MCH: 31.3 pg (ref 26.0–34.0)
MCHC: 34.1 g/dL (ref 30.0–36.0)
MCV: 91.6 fL (ref 78.0–100.0)
Platelets: 55 10*3/uL — ABNORMAL LOW (ref 150–400)
RBC: 2.75 MIL/uL — ABNORMAL LOW (ref 4.22–5.81)
RDW: 13.9 % (ref 11.5–15.5)
WBC: 5.6 10*3/uL (ref 4.0–10.5)

## 2017-03-31 LAB — BODY FLUID CELL COUNT WITH DIFFERENTIAL
EOS FL: 0 %
Lymphs, Fluid: 7 %
Monocyte-Macrophage-Serous Fluid: 60 % (ref 50–90)
Neutrophil Count, Fluid: 33 % — ABNORMAL HIGH (ref 0–25)
WBC FLUID: 1560 uL — AB (ref 0–1000)

## 2017-03-31 LAB — PROTEIN, PLEURAL OR PERITONEAL FLUID: Total protein, fluid: <3 g/dL

## 2017-03-31 LAB — LACTATE DEHYDROGENASE: LDH: 178 U/L (ref 98–192)

## 2017-03-31 LAB — ALBUMIN, PLEURAL OR PERITONEAL FLUID

## 2017-03-31 LAB — PROTIME-INR
INR: 1.69
PROTHROMBIN TIME: 20.1 s — AB (ref 11.4–15.2)

## 2017-03-31 LAB — GLUCOSE, PLEURAL OR PERITONEAL FLUID: Glucose, Fluid: 105 mg/dL

## 2017-03-31 MED ORDER — LIDOCAINE HCL 1 % IJ SOLN
INTRAMUSCULAR | Status: AC
  Administered 2017-03-31: 10 mL
  Filled 2017-03-31: qty 20

## 2017-03-31 MED ORDER — ALBUMIN HUMAN 25 % IV SOLN
25.0000 g | Freq: Four times a day (QID) | INTRAVENOUS | Status: AC
  Administered 2017-03-31 – 2017-04-01 (×3): 25 g via INTRAVENOUS
  Filled 2017-03-31 (×3): qty 100

## 2017-03-31 MED ORDER — FENTANYL CITRATE (PF) 100 MCG/2ML IJ SOLN
25.0000 ug | Freq: Four times a day (QID) | INTRAMUSCULAR | Status: DC
  Administered 2017-03-31 – 2017-04-02 (×7): 25 ug via INTRAVENOUS
  Filled 2017-03-31 (×8): qty 2

## 2017-03-31 MED ORDER — ONDANSETRON HCL 4 MG PO TABS
4.0000 mg | ORAL_TABLET | Freq: Once | ORAL | Status: AC
  Administered 2017-03-31: 4 mg via ORAL
  Filled 2017-03-31: qty 1

## 2017-03-31 MED ORDER — ONDANSETRON HCL 4 MG/2ML IJ SOLN
INTRAMUSCULAR | Status: DC
Start: 2017-03-31 — End: 2017-03-31
  Filled 2017-03-31: qty 2

## 2017-03-31 MED ORDER — ALBUMIN HUMAN 25 % IV SOLN
25.0000 g | Freq: Once | INTRAVENOUS | Status: DC
  Filled 2017-03-31: qty 100

## 2017-03-31 NOTE — Progress Notes (Signed)
Family Medicine Teaching Service Daily Progress Note Intern Pager: [phone]  Patient name: Vincent Ochoa Medical record number: [id number] Date of birth: [birth year] Age: 61 y.o. Gender: male  Primary Care Provider: Bufford Lope, DO Consultants: [place] [facility] Code Status: FULL  Pt Overview and Major Events to Date:  5/9 admitted with abdominal pain and ascites  5/10 paracentesis for 7L   Assessment and Plan: Vincent Ochoa is a 61 y.o. male presenting with abdominal pain and distention. PMH is significant for cirrhosis of liver with recurrent ascites (followed by [facility] hepatology), CAD, psoriasis, thrombocytopenia (followed by [facility]), OSA, knee pain, obesity, GERD, Depression, PAF, HTN, esophageal varices, anemia of chronic disease.   Abdominal distention 2/2 liver cirrhosis, concern for SBP: due to cryptogenic liver cirrhosis with recurrent ascites. MELD score 27 (19.6% 3 month mortality). Follows with [facility] hepatology. Ascitic fluid with ANC 384, SAAG consistent with portal hypertension, continued concern for SBP. Awaiting fluid culture. INR 1.69.  -Consult IR for repeat large volume paracentesis (max 8L) -continue ceftriaxone  -note, he is on daily cipro for SBP prophylaxis at home, this will need to be restarted when CTX is discontinued - Fentanyl 25 mcg every 6 hours as needed for pain  - Blood cultures NG x 1 day  AKI: Baseline SCr 1.05 in our system, however Cr has been trending up slowly since February in [facility] system - Cr on most recent discharge 4/14 was 2.1; SCr 2.27 - concern for hepatorenal syndrome given significant vs. Overly rapid diuresis from recent hx of paracentesis. Recent FeNa at [facility] suggestive of prerenal etiology. 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 - continue trending Cr - strict I and Os   Thrombocytopenia: On admission, plt count of 73. At prior admission on 12/21, plt count of 47. At home on Promacta 100 mg po daily.  -Continue home regimen  -Monitor - consider  holding lovenox  Hypervolemic Hyponatremia: Na 128, signs of fluid overload. -expect continued improvement with additional paracentesis -repeat BMP this afternoon after paracentesis  Hyperkalemia, resolved: Potassium 4.8 from 5.5.  - Cardiac monitoring  - continue home lactulose  GERD:Stable. At home on Protonix 40 mg po BID and sucralfate 1g PO TID.  -Continue home regimen  PFX:TKWIOX. Echo 11/23/2016 with EF 50-55%. No signs of edema or crackles. Lasix and spiro discontinued at last admission to [facility] 4/10-4/14.  -monitor on exam  CAD: S/p Left heart cath July 2016 showing nonobstructive coronary artery disease. Mid cx lesion previously treated with a bare metal stent in 10/2014. Follows with Dr. Burt Knack outpatient. No HF medications per discharge summary from [facility] 4/14.  -no medications  BDZ:HGDJME. Uses CPAP at home.  -CPAP ordered  Depression: Stable. At home on Wellbutrin.  -Wellbutrin 150 mg po daily  FEN/GI: heart healthy/carb mod with fluid restriction Prophylaxis: heparin subq  Disposition: continued inpatient management   Subjective:  Patient feels about the same today. He is amenable to taking more fluid off. He has several questions about his prognosis and liver transplants. I discussed MELD scoring and mortality risk with he and his wife. They appreciate the explanation.   Objective: Temp:  [97.6 F (36.4 C)-98.6 F (37 C)] 98.6 F (37 C) (05/11 0454) Pulse Rate:  [87-95] 87 (05/11 0454) Resp:  [17-18] 18 (05/11 0454) BP: (92-107)/(39-66) 92/39 (05/11 0454) SpO2:  [96 %-100 %] 96 % (05/11 0454) Physical Exam: General: Ill appearing male resting in chair eating breakfast.  Cardiovascular: RRR, no murmur Respiratory: CTAB, easy WOB Abdomen: distended, taut, diffusely  tender to light touch, +BS Extremities: no edema, warm  Laboratory:  Recent Labs Lab 03/29/17 0953 03/30/17 0528 03/31/17 0550  WBC 7.8 7.0 5.6  HGB 9.2* 9.2* 8.6*   HCT 27.2* 26.9* 25.2*  PLT 76* 71* 55*    Recent Labs Lab 03/29/17 0953  03/30/17 0528 03/30/17 1601 03/31/17 0550  NA 128*  < > 130* 128* 127*  K 5.7*  < > 5.6* 5.5* 4.8  CL 107  < > 107 104 105  CO2 13*  < > 16* 15* 13*  BUN 55*  < > 53* 53* 53*  CREATININE 2.27*  < > 2.16* 2.12* 2.19*  CALCIUM 8.7*  < > 8.6* 8.6* 8.3*  PROT 6.1*  --  5.9*  --  5.7*  BILITOT 2.6*  --  2.5*  --  1.1  ALKPHOS 107  --  98  --  90  ALT 45  --  40  --  33  AST 56*  --  45*  --  36  GLUCOSE 120*  < > 99 122* 134*  < > = values in this interval not displayed. INR 1.6.  Imaging/Diagnostic Tests: Ct Abdomen Pelvis Wo Contrast  Result Date: 03/29/2017 CLINICAL DATA:  Abdominal pain.  Elevated lactic acid. EXAM: CT ABDOMEN AND PELVIS WITHOUT CONTRAST TECHNIQUE: Multidetector CT imaging of the abdomen and pelvis was performed following the standard protocol without IV contrast. COMPARISON:  11/19/2016 FINDINGS: Lower chest: Extensive coronary atherosclerotic calcification. No acute finding Hepatobiliary: Cirrhosis with splenomegaly and large volume ascites without concerning density or loculation.Cholecystectomy. Pancreas: Unremarkable. Spleen: Unremarkable. Adrenals/Urinary Tract: Negative adrenals. No hydronephrosis or ureteral calculus. Two right renal calculi measuring up to 6 mm. Unremarkable bladder. Stomach/Bowel: No obstruction. No suspected inflammation. Intermittent prominent thickness of the colonic wall is been seen previously and is likely from portal congestion. Negative for appendicitis. Vascular/Lymphatic: Atherosclerotic calcification. Focal enlargement of the abdominal aorta to 3.1 cm in diameter, unchanged over multiple studies. Venous type calcifications along the left renal and adrenal veins and in the gastrohepatic ligament. Reproductive:Nonspecific dystrophic calcifications in the prostate. Other: Small bilateral inguinal hernias containing fat on the left and ascites on the right.  Musculoskeletal: No acute abnormalities. IMPRESSION: 1. Cirrhosis with large volume ascites. 2. Right nephrolithiasis and other chronic findings are stable and described above. Electronically Signed   By: Monte Fantasia M.D.   On: 03/29/2017 15:04   Dg Abdomen Acute W/chest  Result Date: 03/29/2017 CLINICAL DATA:  Nausea, vomiting and fever last night. History of cirrhosis. EXAM: DG ABDOMEN ACUTE W/ 1V CHEST COMPARISON:  CT abdomen and pelvis 11/19/2016. Single-view of the chest 02/11/2017. FINDINGS: Single-view of the chest demonstrates clear lungs and normal heart size. No pneumothorax or pleural effusion. No free intraperitoneal air is identified. The bowel gas pattern is nonobstructive. Centralization of bowel loops is consistent with ascites. IMPRESSION: No acute abnormality. Findings compatible scratch the findings consistent with ascites. Electronically Signed   By: Inge Rise M.D.   On: 03/29/2017 14:10   Ir Paracentesis  Result Date: 03/30/2017 INDICATION: History of cryptogenic cirrhosis with recurrent abdominal ascites. Request is made for diagnostic and therapeutic paracentesis with a maximum of 7 L and he is to receive 25 g of albumin after the procedure. EXAM: ULTRASOUND GUIDED DIAGNOSTIC AND THERAPEUTIC PARACENTESIS MEDICATIONS: 1% lidocaine COMPLICATIONS: None immediate. PROCEDURE: Informed written consent was obtained from the patient after a discussion of the risks, benefits and alternatives to treatment. A timeout was performed prior to the initiation of the  procedure. Initial ultrasound scanning demonstrates a large amount of ascites within the left lower abdominal quadrant. The left lower abdomen was prepped and draped in the usual sterile fashion. 1% lidocaine was used for local anesthesia. Following this, a 19 gauge, 7-cm, Yueh catheter was introduced. An ultrasound image was saved for documentation purposes. The paracentesis was performed. The catheter was removed and a dressing  was applied. The patient tolerated the procedure well without immediate post procedural complication. FINDINGS: A total of approximately 7 L of slightly cloudy yellow fluid was removed. Samples were sent to the laboratory as requested by the clinical team. IMPRESSION: Successful ultrasound-guided paracentesis yielding 7 liters of peritoneal fluid. Read by: Saverio Danker, PA-C Electronically Signed   By: Lucrezia Europe M.D.   On: 03/30/2017 15:19   Ir Paracentesis  Result Date: 03/24/2017 INDICATION: Patient with a history of cirrhosis and recurrent abdominal ascites. Request is made for therapeutic paracentesis today with a maximum of 5 L. He will receive albumin after the procedure as well. EXAM: ULTRASOUND GUIDED THERAPEUTIC PARACENTESIS MEDICATIONS: 1% lidocaine COMPLICATIONS: None immediate. PROCEDURE: Informed written consent was obtained from the patient after a discussion of the risks, benefits and alternatives to treatment. A timeout was performed prior to the initiation of the procedure. Initial ultrasound scanning demonstrates a large amount of ascites within the right lower abdominal quadrant. The right lower abdomen was prepped and draped in the usual sterile fashion. 1% lidocaine was used for local anesthesia. Following this, a 19 gauge, 7-cm, Yueh catheter was introduced. An ultrasound image was saved for documentation purposes. The paracentesis was performed. The catheter was removed and a dressing was applied. The patient tolerated the procedure well without immediate post procedural complication. FINDINGS: A total of approximately 5 L of yellow fluid was removed. IMPRESSION: Successful ultrasound-guided paracentesis yielding 5 liters of peritoneal fluid. Read by: Saverio Danker, PA-C CLINICAL DATA:  Shin Electronically Signed   By: Aletta Edouard M.D.   On: 03/24/2017 13:07   Ir Paracentesis  Result Date: 03/21/2017 INDICATION: Patient with history of ascites. Request is made for diagnostic and  therapeutic paracentesis. EXAM: ULTRASOUND GUIDED DIAGNOSTIC AND THERAPEUTIC PARACENTESIS MEDICATIONS: 10 mL 1% lidocaine COMPLICATIONS: None immediate. PROCEDURE: Informed written consent was obtained from the patient after a discussion of the risks, benefits and alternatives to treatment. A timeout was performed prior to the initiation of the procedure. Initial ultrasound scanning demonstrates a large amount of ascites within the right lateral abdomen. The right lateral abdomen was prepped and draped in the usual sterile fashion. 1% lidocaine was used for local anesthesia. Following this, a 19 gauge, 7-cm, Yueh catheter was introduced. An ultrasound image was saved for documentation purposes. The paracentesis was performed. The catheter was removed and a dressing was applied. The patient tolerated the procedure well without immediate post procedural complication. FINDINGS: A total of approximately 7.0 liters of clear, yellow fluid was removed. Samples were sent to the laboratory as requested by the clinical team. IMPRESSION: Successful ultrasound-guided diagnostic and therapeutic paracentesis yielding 7.0 liters of peritoneal fluid. Patient is to receive albumin post-procedure. Read by:  Brynda Greathouse PA-C Electronically Signed   By: Corrie Mckusick D.O.   On: 03/21/2017 16:25   Ir Paracentesis  Result Date: 03/14/2017 INDICATION: Cirrhosis with a history of recurrent abdominal ascites. Request is made for diagnostic and therapeutic paracentesis. A maximum of 10 L is given. EXAM: ULTRASOUND GUIDED DIAGNOSTIC AND THERAPEUTIC PARACENTESIS MEDICATIONS: 1% lidocaine COMPLICATIONS: None immediate. PROCEDURE: Informed written consent was obtained from  the patient after a discussion of the risks, benefits and alternatives to treatment. A timeout was performed prior to the initiation of the procedure. Initial ultrasound scanning demonstrates a large amount of ascites within the right lower abdominal quadrant. The right  lower abdomen was prepped and draped in the usual sterile fashion. 1% lidocaine was used for local anesthesia. Following this, a 19 gauge, 7-cm, Yueh catheter was introduced. An ultrasound image was saved for documentation purposes. The paracentesis was performed. The catheter was removed and a dressing was applied. The patient tolerated the procedure well without immediate post procedural complication. FINDINGS: A total of approximately 10 L of serous fluid was removed. IMPRESSION: Successful ultrasound-guided paracentesis yielding 10 liters of peritoneal fluid. Read by: Saverio Danker, PA-C Electronically Signed   By: Corrie Mckusick D.O.   On: 03/14/2017 11:00   Ir Paracentesis  Result Date: 03/08/2017 INDICATION: Patient with recurrent ascites. Request is made for diagnostic and therapeutic paracentesis. Patient has a 7 liter maximum and is to receive albumin post-procedure. EXAM: ULTRASOUND GUIDED DIAGNOSTIC AND THERAPEUTIC PARACENTESIS MEDICATIONS: 10 mL 1% lidocaine COMPLICATIONS: None immediate. PROCEDURE: Informed written consent was obtained from the patient after a discussion of the risks, benefits and alternatives to treatment. A timeout was performed prior to the initiation of the procedure. Initial ultrasound scanning demonstrates a large amount of ascites within the left lateral abdomen. The left lateral abdomen was prepped and draped in the usual sterile fashion. 1% lidocaine was used for local anesthesia. Following this, a 19 gauge, 7-cm, Yueh catheter was introduced. An ultrasound image was saved for documentation purposes. The paracentesis was performed. The catheter was removed and a dressing was applied. The patient tolerated the procedure well without immediate post procedural complication. FINDINGS: A total of approximately 7.0 liters of clear, yellow fluid was removed. Samples were sent to the laboratory as requested by the clinical team. IMPRESSION: Successful ultrasound-guided paracentesis  yielding 7.0 liters of peritoneal fluid. Patient is to receive albumin post-procedure. Read by:  Brynda Greathouse PA-C Electronically Signed   By: Marybelle Killings M.D.   On: 03/08/2017 13:37    Sela Hilding, MD 03/31/2017, 7:21 AM PGY-1, [place] Intern pager: [phone], text pages welcome

## 2017-03-31 NOTE — Procedures (Signed)
PROCEDURE SUMMARY:  Successful US guided paracentesis from right lateral abdomen.  Yielded 8 liters of clear yellow fluid.  No immediate complications.  Pt tolerated well.   Specimen was sent for labs.  Judie Grieve Shataya Winkles PA-C 03/31/2017 12:59 PM

## 2017-03-31 NOTE — Progress Notes (Signed)
FPTS PCP Social Note  Socially visited with patient and his wife today. They are very appreciative of the care he is receiving. Will follow along socially.  Bufford Lope, DO 03/31/2017, 11:02 AM PGY-1, Oriental Medicine Service pager [phone]

## 2017-03-31 NOTE — Progress Notes (Signed)
Patient refused CPAP for tonight.  Advised to have RT called should he change his mind. 

## 2017-04-01 LAB — CBC
HCT: 21.9 % — ABNORMAL LOW (ref 39.0–52.0)
HCT: 23.2 % — ABNORMAL LOW (ref 39.0–52.0)
HEMOGLOBIN: 7.5 g/dL — AB (ref 13.0–17.0)
Hemoglobin: 8 g/dL — ABNORMAL LOW (ref 13.0–17.0)
MCH: 30.9 pg (ref 26.0–34.0)
MCH: 30.9 pg (ref 26.0–34.0)
MCHC: 34.2 g/dL (ref 30.0–36.0)
MCHC: 34.5 g/dL (ref 30.0–36.0)
MCV: 90.1 fL (ref 78.0–100.0)
MCV: 89.6 fL (ref 78.0–100.0)
PLATELETS: 29 10*3/uL — AB (ref 150–400)
Platelets: 43 10*3/uL — ABNORMAL LOW (ref 150–400)
RBC: 2.43 MIL/uL — ABNORMAL LOW (ref 4.22–5.81)
RBC: 2.59 MIL/uL — AB (ref 4.22–5.81)
RDW: 13.4 % (ref 11.5–15.5)
RDW: 13.5 % (ref 11.5–15.5)
WBC: 3.5 10*3/uL — AB (ref 4.0–10.5)
WBC: 3.1 10*3/uL — AB (ref 4.0–10.5)

## 2017-04-01 LAB — COMPREHENSIVE METABOLIC PANEL
ALT: 27 U/L (ref 17–63)
ANION GAP: 9 (ref 5–15)
AST: 36 U/L (ref 15–41)
Albumin: 3.3 g/dL — ABNORMAL LOW (ref 3.5–5.0)
Alkaline Phosphatase: 78 U/L (ref 38–126)
BILIRUBIN TOTAL: 0.9 mg/dL (ref 0.3–1.2)
BUN: 51 mg/dL — AB (ref 6–20)
CO2: 14 mmol/L — ABNORMAL LOW (ref 22–32)
Calcium: 8.3 mg/dL — ABNORMAL LOW (ref 8.9–10.3)
Chloride: 106 mmol/L (ref 101–111)
Creatinine, Ser: 2.08 mg/dL — ABNORMAL HIGH (ref 0.61–1.24)
GFR, EST AFRICAN AMERICAN: 38 mL/min — AB (ref >60)
GFR, EST NON AFRICAN AMERICAN: 33 mL/min — AB (ref >60)
Glucose, Bld: 88 mg/dL (ref 65–99)
POTASSIUM: 4.9 mmol/L (ref 3.5–5.1)
Sodium: 129 mmol/L — ABNORMAL LOW (ref 135–145)
TOTAL PROTEIN: 5.7 g/dL — AB (ref 6.5–8.1)

## 2017-04-01 LAB — LACTIC ACID, PLASMA
Lactic Acid, Venous: 1.8 mmol/L (ref 0.5–1.9)
Lactic Acid, Venous: 1.3 mmol/L (ref 0.5–1.9)
Lactic Acid, Venous: 1.1 mmol/L (ref 0.5–1.9)

## 2017-04-01 LAB — PROTIME-INR
INR: 1.58
PROTHROMBIN TIME: 19 s — AB (ref 11.4–15.2)

## 2017-04-01 MED ORDER — SODIUM CHLORIDE 0.9 % IV BOLUS (SEPSIS)
500.0000 mL | Freq: Once | INTRAVENOUS | Status: AC | PRN
  Administered 2017-04-01: 500 mL via INTRAVENOUS

## 2017-04-01 MED ORDER — ONDANSETRON HCL 4 MG/2ML IJ SOLN
4.0000 mg | Freq: Four times a day (QID) | INTRAMUSCULAR | Status: DC | PRN
  Administered 2017-04-02 (×3): 4 mg via INTRAVENOUS
  Filled 2017-04-01 (×3): qty 2

## 2017-04-01 MED ORDER — CIPROFLOXACIN IN D5W 400 MG/200ML IV SOLN
400.0000 mg | Freq: Two times a day (BID) | INTRAVENOUS | Status: DC
  Administered 2017-04-01 – 2017-04-02 (×3): 400 mg via INTRAVENOUS
  Filled 2017-04-01 (×3): qty 200

## 2017-04-01 MED ORDER — OCTREOTIDE ACETATE 100 MCG/ML IJ SOLN
100.0000 ug | Freq: Two times a day (BID) | INTRAMUSCULAR | Status: DC
  Administered 2017-04-01 – 2017-04-02 (×2): 100 ug via SUBCUTANEOUS
  Filled 2017-04-01 (×3): qty 1

## 2017-04-01 MED ORDER — MIDODRINE HCL 5 MG PO TABS
10.0000 mg | ORAL_TABLET | Freq: Three times a day (TID) | ORAL | Status: DC
  Administered 2017-04-01 – 2017-04-02 (×3): 10 mg via ORAL
  Filled 2017-04-01 (×3): qty 2

## 2017-04-01 NOTE — Significant Event (Signed)
Rapid Response Event Note  Overview: Time Called: 0145 Arrival Time: 0145 Event Type: Hypotension  Initial Focused Assessment: Called to bedside by pt's nurse while conducting unit rounds.  RN states pt has had soft BPs throughout the night and since admission, but now his systolic has dropped into the 70s.  Cycled BP in room which revealed 75/31.  Pt reported dizziness, abdominal pain and nausea.   He was AAO x 4, no change in mental status.  NT reports pt has been voiding throughout the night, 350 ml documented under I/O.  Respirations even and unlabored, lungs CTA.  Abdomen is distended with hyperactive BS and diffuse tenderness. Skin warm and dry.      Interventions:  Trendelenburg positioning and NS bolus 500 ml, improvement in BP from 75/31 to 94/47.  Scheduled albumin 25 g given.  Lactic acid ordered.   Bedside RN notified pt's MD, stat CBC and Zofran ordered for nausea.  Plan of Care (if not transferred):  Tx SDU for closer monitoring, 4NP06.    Event Summary: Name of Physician Notified: Emmaline Life, MD at [place]    at    Outcome: Transferred (Comment)  Event End Time: 0220  Pam Drown

## 2017-04-01 NOTE — Progress Notes (Signed)
Family Medicine Teaching Service Daily Progress Note Intern Pager: [phone]  Patient name: Vincent Ochoa Medical record number: [id number] Date of birth: 1956/05/16 Age: 61 y.o. Gender: male  Primary Care Provider: Bufford Lope, DO Consultants: [place] Pol[place] Code Status: FULL  Pt Overview and Major Events to Date:  5/9 admitted with abdominal pain and ascites  5/10 paracentesis for 7L   Assessment and Plan: Vincent Ochoa is a 61 y.o. male presenting with abdominal pain and distention. PMH is significant for cirrhosis of liver with recurrent ascites (followed by [facility] hepatology), CAD, psoriasis, thrombocytopenia (followed by Heme), OSA, knee pain, obesity, GERD, Depression, PAF, HTN, esophageal varices, anemia of chronic disease.   Ascities 2/2 liver cirrhosis: due to cryptogenic liver cirrhosis with recurrent ascites. S/p therapeutic paracentesis on 5/10 and 5/11. A total of 15L fluid taken off.  MELD score 27 (19.6% 3 month mortality). Follows with [facility] hepatology. SAAG consistent with portal hypertension,  INR 1.69.  - Fentanyl 25 mcg every 6 hours as needed for pain   SBP: patient with abdominal tenderness on admission. Ascitic fluid with ANC 384, continued concern for SBP. Fluid culture from 5/10 without growth. 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 Overly rapid diuresis from recent hx of paracentesis. Recent FeNa at [facility] suggestive of prerenal etiology. 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 - continue trending Cr - strict I and Os   Thrombocytopenia: plt 29, 75on admission. Likely due to his  cirrhosis/splenic sequestration.  -Continuehome Promacta 100 mg po daily.  -Monitor - consider holding lovenox  Anemia: Hemoglobin 8.0 this morning. 9.2 on admission. Baseline between 10 and 11. -Daily CBC  Hypervolemic Hyponatremia: stable. Na 128, signs of fluid overload. -expect continued improvement with additional paracentesis -repeat BMP this afternoon after paracentesis  Hyperkalemia, resolved: Potassium 4.9 from 5.5.  - Cardiac monitoring  - continue home lactulose  GERD:Stable. At home on Protonix 40 mg po BID and sucralfate 1g PO TID.  -Continue home regimen  JYN:WGNFAO. Echo 11/23/2016 with EF 50-55%. No signs of edema or crackles. Lasix and spiro discontinued at last admission to [facility] 4/10-4/14.  -monitor on exam  CAD: S/p Left heart cath July 2016 showing nonobstructive coronary artery disease. Mid cx lesion previously treated with a bare metal stent in 10/2014. Follows with Dr. Burt Knack outpatient. No HF medications per discharge summary from [facility] 4/14.  -no medications  ZHY:QMVHQI. Uses CPAP at home.  -CPAP ordered  Depression: Stable. At home on Wellbutrin.  -Wellbutrin 150 mg po daily  FEN/GI: heart healthy/carb mod with fluid restriction  Prophylaxis: SCD due to trauma cytopenia   Disposition: continued inpatient management.  Subjective:  Reports dizziness overnight. He was hypotensive to 80s/50s. He also reports lower abdominal pain but denies dysuria. He says he has an appointment at [facility] on Tuesday (04/04/2017).   Objective: Temp:  [98.4 F (36.9 C)-99.3 F (37.4 C)] 98.7 F (37.1 C) (05/12 0300) Pulse Rate:  [83-91] 89 (05/12 0300) Resp:  [10-22] 14 (05/12 0500) BP: (75-108)/(31-66) 88/58 (05/12 0500) SpO2:  [95 %-100 %] 96 % (  05/12 0300) Weight:  [230 lb 12.8 oz (104.7 kg)] 230 lb 12.8 oz (104.7 kg) (05/11 2048) Physical Exam: General: Ill appearing male lying in bed Cardiovascular: RRR, no murmur Respiratory: CTAB, easy  WOB Abdomen: distended, diffusely tender to light touch, +BS Extremities: no edema, warm  Laboratory:  Recent Labs Lab 03/30/17 0528 03/31/17 0550 04/01/17 0300  WBC 7.0 5.6 3.5*  HGB 9.2* 8.6* 7.5*  HCT 26.9* 25.2* 21.9*  PLT 71* 55* 43*    Recent Labs Lab 03/30/17 0528  03/31/17 0550 03/31/17 1550 04/01/17 0300  NA 130*  < > 127* 126* 129*  K 5.6*  < > 4.8 4.7 4.9  CL 107  < > 105 104 106  CO2 16*  < > 13* 15* 14*  BUN 53*  < > 53* 52* 51*  CREATININE 2.16*  < > 2.19* 2.05* 2.08*  CALCIUM 8.6*  < > 8.3* 8.4* 8.3*  PROT 5.9*  --  5.7*  --  5.7*  BILITOT 2.5*  --  1.1  --  0.9  ALKPHOS 98  --  90  --  78  ALT 40  --  33  --  27  AST 45*  --  36  --  36  GLUCOSE 99  < > 134* 123* 88  < > = values in this interval not displayed. INR 1.6.  Imaging/Diagnostic Tests: Ct Abdomen Pelvis Wo Contrast  Result Date: 03/29/2017 CLINICAL DATA:  Abdominal pain.  Elevated lactic acid. EXAM: CT ABDOMEN AND PELVIS WITHOUT CONTRAST TECHNIQUE: Multidetector CT imaging of the abdomen and pelvis was performed following the standard protocol without IV contrast. COMPARISON:  11/19/2016 FINDINGS: Lower chest: Extensive coronary atherosclerotic calcification. No acute finding Hepatobiliary: Cirrhosis with splenomegaly and large volume ascites without concerning density or loculation.Cholecystectomy. Pancreas: Unremarkable. Spleen: Unremarkable. Adrenals/Urinary Tract: Negative adrenals. No hydronephrosis or ureteral calculus. Two right renal calculi measuring up to 6 mm. Unremarkable bladder. Stomach/Bowel: No obstruction. No suspected inflammation. Intermittent prominent thickness of the colonic wall is been seen previously and is likely from portal congestion. Negative for appendicitis. Vascular/Lymphatic: Atherosclerotic calcification. Focal enlargement of the abdominal aorta to 3.1 cm in diameter, unchanged over multiple studies. Venous type calcifications along the left renal and adrenal veins  and in the gastrohepatic ligament. Reproductive:Nonspecific dystrophic calcifications in the prostate. Other: Small bilateral inguinal hernias containing fat on the left and ascites on the right. Musculoskeletal: No acute abnormalities. IMPRESSION: 1. Cirrhosis with large volume ascites. 2. Right nephrolithiasis and other chronic findings are stable and described above. Electronically Signed   By: Monte Fantasia M.D.   On: 03/29/2017 15:04   Dg Abdomen Acute W/chest  Result Date: 03/29/2017 CLINICAL DATA:  Nausea, vomiting and fever last night. History of cirrhosis. EXAM: DG ABDOMEN ACUTE W/ 1V CHEST COMPARISON:  CT abdomen and pelvis 11/19/2016. Single-view of the chest 02/11/2017. FINDINGS: Single-view of the chest demonstrates clear lungs and normal heart size. No pneumothorax or pleural effusion. No free intraperitoneal air is identified. The bowel gas pattern is nonobstructive. Centralization of bowel loops is consistent with ascites. IMPRESSION: No acute abnormality. Findings compatible scratch the findings consistent with ascites. Electronically Signed   By: Inge Rise M.D.   On: 03/29/2017 14:10   Ir Paracentesis  Result Date: 03/31/2017 INDICATION: Cirrhosis with recurrent abdominal ascites. Request for diagnostic and therapeutic paracentesis. EXAM: ULTRASOUND GUIDED RIGHT LATERAL ABDOMEN PARACENTESIS MEDICATIONS: 1% Lidocaine = 10 mL. COMPLICATIONS: None immediate. PROCEDURE: Informed written consent was obtained from the patient after a discussion  of the risks, benefits and alternatives to treatment. A timeout was performed prior to the initiation of the procedure. Initial ultrasound scanning demonstrates a large amount of ascites within the right lateral abdomen. The right lateral abdomen was prepped and draped in the usual sterile fashion. 1% lidocaine with epinephrine was used for local anesthesia. Following this, a 19 gauge, 7-cm, Yueh catheter was introduced. An ultrasound image was  saved for documentation purposes. The paracentesis was performed. The catheter was removed and a dressing was applied. The patient tolerated the procedure well without immediate post procedural complication. FINDINGS: A total of approximately 8 liters of clear yellow fluid was removed. Samples were sent to the laboratory as requested by the clinical team. IMPRESSION: Successful ultrasound-guided paracentesis yielding 8 liters of peritoneal fluid. Read by:  Gareth Eagle, PA-C Electronically Signed   By: Jacqulynn Cadet M.D.   On: 03/31/2017 12:59   Ir Paracentesis  Result Date: 03/30/2017 INDICATION: History of cryptogenic cirrhosis with recurrent abdominal ascites. Request is made for diagnostic and therapeutic paracentesis with a maximum of 7 L and he is to receive 25 g of albumin after the procedure. EXAM: ULTRASOUND GUIDED DIAGNOSTIC AND THERAPEUTIC PARACENTESIS MEDICATIONS: 1% lidocaine COMPLICATIONS: None immediate. PROCEDURE: Informed written consent was obtained from the patient after a discussion of the risks, benefits and alternatives to treatment. A timeout was performed prior to the initiation of the procedure. Initial ultrasound scanning demonstrates a large amount of ascites within the left lower abdominal quadrant. The left lower abdomen was prepped and draped in the usual sterile fashion. 1% lidocaine was used for local anesthesia. Following this, a 19 gauge, 7-cm, Yueh catheter was introduced. An ultrasound image was saved for documentation purposes. The paracentesis was performed. The catheter was removed and a dressing was applied. The patient tolerated the procedure well without immediate post procedural complication. FINDINGS: A total of approximately 7 L of slightly cloudy yellow fluid was removed. Samples were sent to the laboratory as requested by the clinical team. IMPRESSION: Successful ultrasound-guided paracentesis yielding 7 liters of peritoneal fluid. Read by: Saverio Danker, PA-C  Electronically Signed   By: Lucrezia Europe M.D.   On: 03/30/2017 15:19   Ir Paracentesis  Result Date: 03/24/2017 INDICATION: Patient with a history of cirrhosis and recurrent abdominal ascites. Request is made for therapeutic paracentesis today with a maximum of 5 L. He will receive albumin after the procedure as well. EXAM: ULTRASOUND GUIDED THERAPEUTIC PARACENTESIS MEDICATIONS: 1% lidocaine COMPLICATIONS: None immediate. PROCEDURE: Informed written consent was obtained from the patient after a discussion of the risks, benefits and alternatives to treatment. A timeout was performed prior to the initiation of the procedure. Initial ultrasound scanning demonstrates a large amount of ascites within the right lower abdominal quadrant. The right lower abdomen was prepped and draped in the usual sterile fashion. 1% lidocaine was used for local anesthesia. Following this, a 19 gauge, 7-cm, Yueh catheter was introduced. An ultrasound image was saved for documentation purposes. The paracentesis was performed. The catheter was removed and a dressing was applied. The patient tolerated the procedure well without immediate post procedural complication. FINDINGS: A total of approximately 5 L of yellow fluid was removed. IMPRESSION: Successful ultrasound-guided paracentesis yielding 5 liters of peritoneal fluid. Read by: Saverio Danker, PA-C CLINICAL DATA:  Shin Electronically Signed   By: Aletta Edouard M.D.   On: 03/24/2017 13:07   Ir Paracentesis  Result Date: 03/21/2017 INDICATION: Patient with history of ascites. Request is made for diagnostic and therapeutic paracentesis.  EXAM: ULTRASOUND GUIDED DIAGNOSTIC AND THERAPEUTIC PARACENTESIS MEDICATIONS: 10 mL 1% lidocaine COMPLICATIONS: None immediate. PROCEDURE: Informed written consent was obtained from the patient after a discussion of the risks, benefits and alternatives to treatment. A timeout was performed prior to the initiation of the procedure. Initial ultrasound  scanning demonstrates a large amount of ascites within the right lateral abdomen. The right lateral abdomen was prepped and draped in the usual sterile fashion. 1% lidocaine was used for local anesthesia. Following this, a 19 gauge, 7-cm, Yueh catheter was introduced. An ultrasound image was saved for documentation purposes. The paracentesis was performed. The catheter was removed and a dressing was applied. The patient tolerated the procedure well without immediate post procedural complication. FINDINGS: A total of approximately 7.0 liters of clear, yellow fluid was removed. Samples were sent to the laboratory as requested by the clinical team. IMPRESSION: Successful ultrasound-guided diagnostic and therapeutic paracentesis yielding 7.0 liters of peritoneal fluid. Patient is to receive albumin post-procedure. Read by:  Brynda Greathouse PA-C Electronically Signed   By: Corrie Mckusick D.O.   On: 03/21/2017 16:25   Ir Paracentesis  Result Date: 03/14/2017 INDICATION: Cirrhosis with a history of recurrent abdominal ascites. Request is made for diagnostic and therapeutic paracentesis. A maximum of 10 L is given. EXAM: ULTRASOUND GUIDED DIAGNOSTIC AND THERAPEUTIC PARACENTESIS MEDICATIONS: 1% lidocaine COMPLICATIONS: None immediate. PROCEDURE: Informed written consent was obtained from the patient after a discussion of the risks, benefits and alternatives to treatment. A timeout was performed prior to the initiation of the procedure. Initial ultrasound scanning demonstrates a large amount of ascites within the right lower abdominal quadrant. The right lower abdomen was prepped and draped in the usual sterile fashion. 1% lidocaine was used for local anesthesia. Following this, a 19 gauge, 7-cm, Yueh catheter was introduced. An ultrasound image was saved for documentation purposes. The paracentesis was performed. The catheter was removed and a dressing was applied. The patient tolerated the procedure well without immediate  post procedural complication. FINDINGS: A total of approximately 10 L of serous fluid was removed. IMPRESSION: Successful ultrasound-guided paracentesis yielding 10 liters of peritoneal fluid. Read by: Saverio Danker, PA-C Electronically Signed   By: Corrie Mckusick D.O.   On: 03/14/2017 11:00   Ir Paracentesis  Result Date: 03/08/2017 INDICATION: Patient with recurrent ascites. Request is made for diagnostic and therapeutic paracentesis. Patient has a 7 liter maximum and is to receive albumin post-procedure. EXAM: ULTRASOUND GUIDED DIAGNOSTIC AND THERAPEUTIC PARACENTESIS MEDICATIONS: 10 mL 1% lidocaine COMPLICATIONS: None immediate. PROCEDURE: Informed written consent was obtained from the patient after a discussion of the risks, benefits and alternatives to treatment. A timeout was performed prior to the initiation of the procedure. Initial ultrasound scanning demonstrates a large amount of ascites within the left lateral abdomen. The left lateral abdomen was prepped and draped in the usual sterile fashion. 1% lidocaine was used for local anesthesia. Following this, a 19 gauge, 7-cm, Yueh catheter was introduced. An ultrasound image was saved for documentation purposes. The paracentesis was performed. The catheter was removed and a dressing was applied. The patient tolerated the procedure well without immediate post procedural complication. FINDINGS: A total of approximately 7.0 liters of clear, yellow fluid was removed. Samples were sent to the laboratory as requested by the clinical team. IMPRESSION: Successful ultrasound-guided paracentesis yielding 7.0 liters of peritoneal fluid. Patient is to receive albumin post-procedure. Read by:  Brynda Greathouse PA-C Electronically Signed   By: Marybelle Killings M.D.   On: 03/08/2017 13:37  Mercy Riding, MD 04/01/2017, 7:24 AM PGY-2, [place] Intern pager: [phone], text pages welcome

## 2017-04-01 NOTE — Discharge Summary (Signed)
[facility] Discharge Summary  Patient name: Vincent Ochoa Medical record number: [id number] Date of birth: [birth year] Age: 61 y.o. 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 Thrombocytopenia Anemia CHF/CAD OSA Depression  Disposition: [facility]  Discharge Condition: fair  Discharge Exam:  Vitals:   04/02/17 0704 04/02/17 0800 04/02/17 0830 04/02/17 0930  BP: (!) 88/38 (!) 97/52  (!) 84/42  Pulse:      Resp: 11 12  12   Temp: 97.7 F (36.5 C)     TempSrc: Oral     SpO2:  100% 100%   Weight:      Height:       General: Ill appearing male, sitting on bedside commode Cardiovascular: RRR, no murmur Respiratory: CTAB, easy WOB Abdomen: distended, diffusely tender to light touch, +BS Extremities: no edema, warm Neuro: Alert, oriented appropriately. No gross deficit.   Brief Hospital Course:  Patient presented from IR suite with concern from staff about degree of his abdominal pain. Known history of cryptogenic cirrhosis with recurrent ascites and weekly schedule paracentesis. ED workup with no leukocytosis, thrombocytopenia consistent with previous, hyperkalemia to 5.7 and trended down to 4.9 with kayexalate. Cr 2.27 (likely new baseline) and trended down to 2.08. Lipase WNL.  INR 1.56, peaked at 1.72 and came down to 1.58. Lactic acidosis to 2.53 and resolved after 1L bolus given in ED. CT abdomen and pelvis with recurrent large volume ascites, no bowel perforation. Zosyn given in the ED, transitioned to ceftriaxone for empiric SBP coverage. IR paracentesis on 03/30/2017 yielded 7L of cloudy fluid, with ANC >250 suggestive of possible SBP. Ascitic fluid cultures were NGTD. Blood  cultures from 03/29/2017 was NGTD.  Patient had repeat IR paracentesis on 03/31/2017 that yielded 8 L of clear yellow fluid. Received 1gm/kg albumin. Fluid cytology with 515 PMN. Again fluid culture was NGTD. His blood pressure dropped to 80s/50s the night of 5/11 to 5/12. He also felt lightheaded. So he was transferred to stepdown unit for close monitoring. BP improved to 90s/50s without intervention. Lightheaded resolved.  However, he felt fatigued. His platelets trended from 72-29 but bounced back to 52 on the day of transfer. Hgb stable at 9.2.  Patient has a MELD-Na score of 28 (27-33% mortality in 3 month). This was discussed with the patient and his wife in detail. Patient states that he was told to be a candidate for liver transplant by his hepatologist. So palliative care was not consulted.   On 04/01/2017, we discussed the case with the hepatologist on call at [facility], Dr. Ahmed Prima. Per Dr. Ahmed Prima, patient will be on waiting list until a bed is available. He also recommended giving him albumin to keep serum albumin over 3.0, and started midodrine 10 mg three times a day  and octreotide 100 mcg three times a day. He also suggested talking to ID about his antibiotic as cephalosporin could cause thrombocytopenia. Accordingly, I discussed this with Dr. Johnnye Sima, in house ID on call who recommended switching him to ciprofloxacin.   On the day of transfer, patient's clinical picture slightly improved. Blood pressure 100/52, heart rate 78, respiratory rate 12 and Temp 97.7.   Issues for Follow Up:  1. Ascites: gets weekly tapping. IR tap on 03/30/2017 and 03/31/2017 with 7L and  8L off respectively  2. SBP: Treated with ceftriaxone from 03/29/2017- 04/01/2017 and switched to IV ciprofloxacin 400 mg twice a day on 04/01/2017 due to thrombocytopenia 3. 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  4. Anemia: Hgb stable at 9.2 on admission. Baseline appears to be between 10 and 11 5. Thrombocytopenia:  Platelet count trended from 72 on admission to 29, then bounced back to 52.   Significant Procedures:  03/30/2017-IR paracentesis-7L Ascitic fluid off 03/31/2017-IR paracentesis-8L Ascitic fluid off  Significant Labs and Imaging:   Recent Labs Lab 04/01/17 0300 04/01/17 0804 04/02/17 0230  WBC 3.5* 3.1* 4.1  HGB 7.5* 8.0* 9.2*  HCT 21.9* 23.2* 25.8*  PLT 43* 29* 53*    Recent Labs Lab 03/29/17 0953  03/30/17 0528 03/30/17 1601 03/31/17 0550 03/31/17 1550 04/01/17 0300 04/02/17 0230  NA 128*  < > 130* 128* 127* 126* 129* 127*  K 5.7*  < > 5.6* 5.5* 4.8 4.7 4.9 5.6*  CL 107  < > 107 104 105 104 106 103  CO2 13*  < > 16* 15* 13* 15* 14* 16*  GLUCOSE 120*  < > 99 122* 134* 123* 88 111*  BUN 55*  < > 53* 53* 53* 52* 51* 49*  CREATININE 2.27*  < > 2.16* 2.12* 2.19* 2.05* 2.08* 2.28*  CALCIUM 8.7*  < > 8.6* 8.6* 8.3* 8.4* 8.3* 8.4*  MG  --   --  2.0  --   --   --   --   --   PHOS  --   --  4.4  --   --   --   --   --   ALKPHOS 107  --  98  --  90  --  78 90  AST 56*  --  45*  --  36  --  36 40  ALT 45  --  40  --  33  --  27 32  ALBUMIN 3.2*  --  2.9*  --  2.8*  --  3.3* 3.2*  < > = values in this interval not displayed.   Results/Tests Pending at Time of Discharge: none  Discharge Medications:  Allergies as of 04/02/2017      Reactions   Asa [aspirin] Other (See Comments)   Other reaction(s): Other (See Comments) Previous hemmorhage Bleeding Previous hemmorhage   Eliquis [apixaban] Other (See Comments)   bleeding Potentiality of internal bleeding   Nsaids Other (See Comments), Anaphylaxis   May cause bleeding issues (per wife) May only have 1 plain Tylenol every 8 hours is needed for flu symptoms, per doctor.   Plavix [clopidogrel] Other (See Comments)   Bleeding Potentiality of internal bleeding   Statins Other (See Comments)   Other reaction(s): Other (See Comments) Potentially may make patient bleed internally (per wife) Potentially may make patient bleed  internally (per wife)   Tolmetin    Other reaction(s): Other (See Comments) May cause bleeding issues (per wife) May only have 1 plain Tylenol every 8 hours is needed for flu symptoms, per doctor.      Medication List    STOP taking these medications   atorvastatin 40 MG tablet Commonly known as:  LIPITOR     TAKE these medications   buPROPion 150 MG 24 hr tablet Commonly known as:  WELLBUTRIN XL Take 1 tablet (150 mg total) by mouth every other day. What changed:  when to take this   ciprofloxacin 500 MG tablet Commonly known as:  CIPRO  Take 500 mg by mouth daily.   eltrombopag 50 MG tablet Commonly known as:  PROMACTA Take 1 tablet (50 mg total) by mouth daily. Take on an empty stomach 1 hour before a meal or 2 hours after   Lactulose 20 GM/30ML Soln Take 45 mLs (30 g total) by mouth 2 (two) times daily.   MULTIVITAMIN GUMMIES ADULT Chew Chew 1 tablet by mouth daily.   nitroGLYCERIN 0.4 MG SL tablet Commonly known as:  NITROSTAT Place 1 tablet (0.4 mg total) under the tongue every 5 (five) minutes as needed for chest pain (up to 3 doses).   pantoprazole 40 MG tablet Commonly known as:  PROTONIX Take 1 tablet (40 mg total) by mouth 2 (two) times daily.   shark liver oil-cocoa butter 0.25-3-85.5 % suppository Commonly known as:  PREPARATION H Place 1 suppository rectally as needed for hemorrhoids.   sucralfate 1 g tablet Commonly known as:  CARAFATE Take 1 g by mouth 3 (three) times daily as needed (stomach irritation).       Discharge Instructions: Please refer to Patient Instructions section of EMR for full details.  Patient was counseled important signs and symptoms that should prompt return to medical care, changes in medications, dietary instructions, activity restrictions, and follow up appointments.   Follow-Up Appointments: Follow-up Information    Bufford Lope, DO. Go on 04/03/2017.   Why:  9am for hospital follow up appointment with Dr. Silver Huguenin  information: [place] [phone]           Mercy Riding, MD 04/02/2017, 9:59 AM PGY-2, [place]

## 2017-04-01 NOTE — Progress Notes (Addendum)
FPTS Interim Progress Note  S:Called about patient's blood pressure. Previously cycled 76/32 x 2 early in the night. Patient indicated being nauseated and slightly dizzy with the nurse earlier. Per wife patient with a hx of these complaints at home. Patient with complains of  belly pain to me on arrival, similar to the previously belly pain he has been having over the past two days. Not worsening.   O: BP (!) 94/47 (BP Location: Right Arm)   Pulse 90   Temp 99 F (37.2 C) (Oral)   Resp (!) 22   Ht 6\' 4"  (1.93 m)   Wt 230 lb 12.8 oz (104.7 kg)   SpO2 98%   BMI 28.09 kg/m   General: Lying in bed, no signs of bleeding, complains of abdominal pain, NAD  Abdomen: Remain's ttp, distention significantly improved - overall no changes   A/P:  Hypotension in the setting of recent therapeutic tap with 8 Liters of fluid off. Patient receiving 1g/kg of albumin currently. Patient with low blood pressures in the past 80s/40s. Likely blood pressure due to fluid changes with total of 16 L off over the past 24 hours. Other less likely cause of hypotension including bleeding from esophageal varices - though very unlikely given patient's lack of acute bleeding and normal HR vs. Sepsis from tap with due to bowel perforation - again unlikely given lack of SIRS criteria  - Will transfer to stepdown for closer nursing  - Given BP returned to patient's normal  94/47 x 2 over 5-10 minutes without any intervention  - Will obtain stat CBC  - Will obtain lactic acid  - Could consider small bolus of 250 cc's if any further drops of blood pressures in the 70s.  - Zofran as needed for nausea   Tonette Bihari, MD 04/01/2017, 2:21 AM PGY-2, [place] [facility] pager [phone]

## 2017-04-01 NOTE — Progress Notes (Signed)
Pt refusing CPAP tonight, but stated, per his doctor, he needs to be on O2 QHS. Pt placed on 2LNC. RT will continue to monitor.

## 2017-04-01 NOTE — Progress Notes (Signed)
CRITICAL VALUE ALERT  Critical value received: PLT  Date of notification:  03/31/2017  Time of notification:  0915  Critical value read back:Yes.    Nurse who received alert:  Caralee Ates  MD notified (1st page):  Cyndia Skeeters  Time of first page: 0921  MD notified (2nd page):  Time of second page:  Responding MD:  Cyndia Skeeters @ [place]  Time MD responded:  [phone]

## 2017-04-02 DIAGNOSIS — R188 Other ascites: Secondary | ICD-10-CM

## 2017-04-02 DIAGNOSIS — I259 Chronic ischemic heart disease, unspecified: Secondary | ICD-10-CM | POA: Diagnosis not present

## 2017-04-02 DIAGNOSIS — D369 Benign neoplasm, unspecified site: Secondary | ICD-10-CM | POA: Diagnosis not present

## 2017-04-02 DIAGNOSIS — K641 Second degree hemorrhoids: Secondary | ICD-10-CM | POA: Diagnosis not present

## 2017-04-02 DIAGNOSIS — K766 Portal hypertension: Secondary | ICD-10-CM | POA: Diagnosis not present

## 2017-04-02 DIAGNOSIS — R180 Malignant ascites: Secondary | ICD-10-CM

## 2017-04-02 DIAGNOSIS — D696 Thrombocytopenia, unspecified: Secondary | ICD-10-CM | POA: Diagnosis not present

## 2017-04-02 DIAGNOSIS — K635 Polyp of colon: Secondary | ICD-10-CM | POA: Diagnosis not present

## 2017-04-02 DIAGNOSIS — G4733 Obstructive sleep apnea (adult) (pediatric): Secondary | ICD-10-CM | POA: Diagnosis not present

## 2017-04-02 DIAGNOSIS — E871 Hypo-osmolality and hyponatremia: Secondary | ICD-10-CM | POA: Diagnosis not present

## 2017-04-02 DIAGNOSIS — K652 Spontaneous bacterial peritonitis: Secondary | ICD-10-CM | POA: Diagnosis not present

## 2017-04-02 DIAGNOSIS — K7469 Other cirrhosis of liver: Secondary | ICD-10-CM | POA: Diagnosis not present

## 2017-04-02 DIAGNOSIS — D124 Benign neoplasm of descending colon: Secondary | ICD-10-CM | POA: Diagnosis not present

## 2017-04-02 DIAGNOSIS — K7460 Unspecified cirrhosis of liver: Secondary | ICD-10-CM | POA: Diagnosis not present

## 2017-04-02 DIAGNOSIS — N179 Acute kidney failure, unspecified: Secondary | ICD-10-CM | POA: Diagnosis not present

## 2017-04-02 DIAGNOSIS — K7689 Other specified diseases of liver: Secondary | ICD-10-CM | POA: Diagnosis not present

## 2017-04-02 DIAGNOSIS — N200 Calculus of kidney: Secondary | ICD-10-CM | POA: Diagnosis not present

## 2017-04-02 DIAGNOSIS — N184 Chronic kidney disease, stage 4 (severe): Secondary | ICD-10-CM | POA: Diagnosis not present

## 2017-04-02 DIAGNOSIS — E875 Hyperkalemia: Secondary | ICD-10-CM | POA: Diagnosis not present

## 2017-04-02 DIAGNOSIS — K621 Rectal polyp: Secondary | ICD-10-CM | POA: Diagnosis not present

## 2017-04-02 DIAGNOSIS — D649 Anemia, unspecified: Secondary | ICD-10-CM | POA: Diagnosis not present

## 2017-04-02 DIAGNOSIS — I8500 Esophageal varices without bleeding: Secondary | ICD-10-CM | POA: Diagnosis not present

## 2017-04-02 DIAGNOSIS — K659 Peritonitis, unspecified: Secondary | ICD-10-CM | POA: Diagnosis not present

## 2017-04-02 DIAGNOSIS — K920 Hematemesis: Secondary | ICD-10-CM | POA: Diagnosis not present

## 2017-04-02 DIAGNOSIS — D125 Benign neoplasm of sigmoid colon: Secondary | ICD-10-CM | POA: Diagnosis not present

## 2017-04-02 DIAGNOSIS — D693 Immune thrombocytopenic purpura: Secondary | ICD-10-CM | POA: Diagnosis not present

## 2017-04-02 DIAGNOSIS — Z1211 Encounter for screening for malignant neoplasm of colon: Secondary | ICD-10-CM | POA: Diagnosis not present

## 2017-04-02 LAB — COMPREHENSIVE METABOLIC PANEL
ALBUMIN: 3.2 g/dL — AB (ref 3.5–5.0)
ALK PHOS: 90 U/L (ref 38–126)
ALT: 32 U/L (ref 17–63)
ANION GAP: 8 (ref 5–15)
AST: 40 U/L (ref 15–41)
BILIRUBIN TOTAL: 1.7 mg/dL — AB (ref 0.3–1.2)
BUN: 49 mg/dL — AB (ref 6–20)
CHLORIDE: 103 mmol/L (ref 101–111)
CO2: 16 mmol/L — ABNORMAL LOW (ref 22–32)
Calcium: 8.4 mg/dL — ABNORMAL LOW (ref 8.9–10.3)
Creatinine, Ser: 2.28 mg/dL — ABNORMAL HIGH (ref 0.61–1.24)
GFR calc Af Amer: 34 mL/min — ABNORMAL LOW (ref >60)
GFR calc non Af Amer: 29 mL/min — ABNORMAL LOW (ref >60)
GLUCOSE: 111 mg/dL — AB (ref 65–99)
POTASSIUM: 5.6 mmol/L — AB (ref 3.5–5.1)
SODIUM: 127 mmol/L — AB (ref 135–145)
TOTAL PROTEIN: 6 g/dL — AB (ref 6.5–8.1)

## 2017-04-02 LAB — CBC
HEMATOCRIT: 25.8 % — AB (ref 39.0–52.0)
HEMOGLOBIN: 9.2 g/dL — AB (ref 13.0–17.0)
MCH: 32.4 pg (ref 26.0–34.0)
MCHC: 35.7 g/dL (ref 30.0–36.0)
MCV: 90.8 fL (ref 78.0–100.0)
Platelets: 53 10*3/uL — ABNORMAL LOW (ref 150–400)
RBC: 2.84 MIL/uL — ABNORMAL LOW (ref 4.22–5.81)
RDW: 13.7 % (ref 11.5–15.5)
WBC: 4.1 10*3/uL (ref 4.0–10.5)

## 2017-04-02 LAB — PROTIME-INR
INR: 1.62
Prothrombin Time: 19.4 seconds — ABNORMAL HIGH (ref 11.4–15.2)

## 2017-04-02 LAB — LACTIC ACID, PLASMA: LACTIC ACID, VENOUS: 1.6 mmol/L (ref 0.5–1.9)

## 2017-04-02 MED ORDER — FENTANYL CITRATE (PF) 100 MCG/2ML IJ SOLN
50.0000 ug | Freq: Once | INTRAMUSCULAR | Status: AC
  Administered 2017-04-02: 50 ug via INTRAVENOUS

## 2017-04-02 MED ORDER — SODIUM POLYSTYRENE SULFONATE 15 GM/60ML PO SUSP
30.0000 g | Freq: Once | ORAL | Status: AC
  Administered 2017-04-02: 30 g via ORAL
  Filled 2017-04-02: qty 120

## 2017-04-02 MED ORDER — PROMETHAZINE HCL 25 MG/ML IJ SOLN
12.5000 mg | Freq: Once | INTRAMUSCULAR | Status: AC
  Administered 2017-04-02: 12.5 mg via INTRAVENOUS
  Filled 2017-04-02: qty 1

## 2017-04-02 NOTE — Progress Notes (Signed)
Family Medicine Teaching Service Daily Progress Note Intern Pager: [phone]  Patient name: Vincent Ochoa Medical record number: [id number] Date of birth: [birth year] Age: 61 y.o. Gender: male  Primary Care Provider: Bufford Lope, DO Consultants: [place] [facility] Code Status: FULL  Pt Overview and Major Events to Date:  5/9 admitted with abdominal pain and ascites  5/10 paracentesis for 7L   Assessment and Plan: Vincent Ochoa is a 61 y.o. male presenting with abdominal pain and distention. PMH is significant for cirrhosis of liver with recurrent ascites (followed by [facility] hepatology), CAD, psoriasis, thrombocytopenia (followed by [facility]), OSA, knee pain, obesity, GERD, Depression, PAF, HTN, esophageal varices, anemia of chronic disease.   Ascities 2/2 liver cirrhosis: due to cryptogenic liver cirrhosis with recurrent ascites. S/p therapeutic paracentesis on 5/10 and 5/11. A total of 15L fluid taken off. SAAG consistent with portal hypertension.Marland Kitchen MELD score 28 (27-32% 3 month mortality). Follows with [facility] hepatology. Hasn't been evaluated for transplant list yet.   - Fentanyl 25 mcg every 6 hours as needed for pain   SBP: patient with abdominal tenderness on admission. Ascitic fluid from 5/10 and 5/11 with ANC 384 and 515 respectively. Fluid culture from 5/10 and 5/11 without growth. Patient very distended and tender overnight.  - Blood cultures NGTD - Changed CTX to Cipro per ID (Dr. Algis Downs) recommendation due to thrombocytopenia.  - Will be discharged on PPX dose if he goes home.   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 Likely new baseline.  - continue trending Cr - strict I and Os  - Started midodrine and octreotide per recommendation by Dr. Ahmed Prima from [place] give albumin as needed to keep his serum albumin was 3.0  Thrombocytopenia: improved. 75on admit-->29 (trough)-->51. Likely due to his cirrhosis/splenic sequestration and CTX.   -Continuehome Promacta 100 mg po daily.  -Monitor -Changed CTX to Cipro.  Anemia: Hgb 9.2 on admit-->8.0-->9.2. ?Hemoconcentration with intravascular fluid loss or improvement?. Baseline between 10 and 11.  -Daily CBC  Hypervolemic Hyponatremia: stable. Na 128, signs of fluid overload. -expect continued improvement with additional paracentesis -repeat BMP this afternoon after paracentesis  Hyperkalemia, resolved: Potassium 4.9 from 5.5.  - Cardiac monitoring  - continue home lactulose  GERD:Stable. At home on Protonix 40 mg po BID and sucralfate 1g PO TID.  -Continue home regimen  KWI:OXBDZH. Echo 11/23/2016 with EF 50-55%. No signs of edema or crackles. Lasix and spiro discontinued at last admission to [facility] 4/10-4/14.  -monitor on exam  CAD: S/p Left heart cath July 2016 showing nonobstructive coronary artery disease. Mid cx lesion previously treated with a bare metal stent in 10/2014. Follows with Dr. Burt Knack outpatient. No HF medications per discharge summary from [facility] 4/14.  -no medications  GDJ:MEQAST. Uses CPAP at home.  -CPAP ordered  Depression: Stable. At home on Wellbutrin.  -Wellbutrin 150 mg po daily  FEN/GI: heart healthy/carb mod with fluid restriction  Prophylaxis: SCD due to trauma cytopenia   Disposition: will transfer to [facility] pending bed availability.   Subjective:  Feeling better this morning. Had abdominal pain that has resolved. Slept well after fentanyl last night.   Objective: Temp:  [97.8 F (36.6 C)-98.3 F (36.8 C)] 97.8 F (36.6 C) (05/13 0415) Pulse Rate:  [78] 78 (05/12 2337) Resp:  [10-20] 10 (05/13 0415) BP: (74-103)/(41-85) 93/47 (05/13 0415) Physical Exam: General: Ill appearing male. Sitting on bedside comode. More comfortable today.  Cardiovascular: RRR, no murmur Respiratory:  CTAB, easy WOB Abdomen: distended, diffusely tender to light touch, +BS Extremities: no edema, warm  Laboratory:  Recent Labs Lab  04/01/17 0300 04/01/17 0804 04/02/17 0230  WBC 3.5* 3.1* 4.1  HGB 7.5* 8.0* 9.2*  HCT 21.9* 23.2* 25.8*  PLT 43* 29* 53*    Recent Labs Lab 03/31/17 0550 03/31/17 1550 04/01/17 0300 04/02/17 0230  NA 127* 126* 129* 127*  K 4.8 4.7 4.9 5.6*  CL 105 104 106 103  CO2 13* 15* 14* 16*  BUN 53* 52* 51* 49*  CREATININE 2.19* 2.05* 2.08* 2.28*  CALCIUM 8.3* 8.4* 8.3* 8.4*  PROT 5.7*  --  5.7* 6.0*  BILITOT 1.1  --  0.9 1.7*  ALKPHOS 90  --  78 90  ALT 33  --  27 32  AST 36  --  36 40  GLUCOSE 134* 123* 88 111*   INR 1.6.  Imaging/Diagnostic Tests: Ct Abdomen Pelvis Wo Contrast  Result Date: 03/29/2017 CLINICAL DATA:  Abdominal pain.  Elevated lactic acid. EXAM: CT ABDOMEN AND PELVIS WITHOUT CONTRAST TECHNIQUE: Multidetector CT imaging of the abdomen and pelvis was performed following the standard protocol without IV contrast. COMPARISON:  11/19/2016 FINDINGS: Lower chest: Extensive coronary atherosclerotic calcification. No acute finding Hepatobiliary: Cirrhosis with splenomegaly and large volume ascites without concerning density or loculation.Cholecystectomy. Pancreas: Unremarkable. Spleen: Unremarkable. Adrenals/Urinary Tract: Negative adrenals. No hydronephrosis or ureteral calculus. Two right renal calculi measuring up to 6 mm. Unremarkable bladder. Stomach/Bowel: No obstruction. No suspected inflammation. Intermittent prominent thickness of the colonic wall is been seen previously and is likely from portal congestion. Negative for appendicitis. Vascular/Lymphatic: Atherosclerotic calcification. Focal enlargement of the abdominal aorta to 3.1 cm in diameter, unchanged over multiple studies. Venous type calcifications along the left renal and adrenal veins and in the gastrohepatic ligament. Reproductive:Nonspecific dystrophic calcifications in the prostate. Other: Small bilateral inguinal hernias containing fat on the left and ascites on the right. Musculoskeletal: No acute  abnormalities. IMPRESSION: 1. Cirrhosis with large volume ascites. 2. Right nephrolithiasis and other chronic findings are stable and described above. Electronically Signed   By: Monte Fantasia M.D.   On: 03/29/2017 15:04   Dg Abdomen Acute W/chest  Result Date: 03/29/2017 CLINICAL DATA:  Nausea, vomiting and fever last night. History of cirrhosis. EXAM: DG ABDOMEN ACUTE W/ 1V CHEST COMPARISON:  CT abdomen and pelvis 11/19/2016. Single-view of the chest 02/11/2017. FINDINGS: Single-view of the chest demonstrates clear lungs and normal heart size. No pneumothorax or pleural effusion. No free intraperitoneal air is identified. The bowel gas pattern is nonobstructive. Centralization of bowel loops is consistent with ascites. IMPRESSION: No acute abnormality. Findings compatible scratch the findings consistent with ascites. Electronically Signed   By: Inge Rise M.D.   On: 03/29/2017 14:10   Ir Paracentesis  Result Date: 03/31/2017 INDICATION: Cirrhosis with recurrent abdominal ascites. Request for diagnostic and therapeutic paracentesis. EXAM: ULTRASOUND GUIDED RIGHT LATERAL ABDOMEN PARACENTESIS MEDICATIONS: 1% Lidocaine = 10 mL. COMPLICATIONS: None immediate. PROCEDURE: Informed written consent was obtained from the patient after a discussion of the risks, benefits and alternatives to treatment. A timeout was performed prior to the initiation of the procedure. Initial ultrasound scanning demonstrates a large amount of ascites within the right lateral abdomen. The right lateral abdomen was prepped and draped in the usual sterile fashion. 1% lidocaine with epinephrine was used for local anesthesia. Following this, a 19 gauge, 7-cm, Yueh catheter was introduced. An ultrasound image was saved for documentation purposes. The paracentesis was performed. The catheter was  removed and a dressing was applied. The patient tolerated the procedure well without immediate post procedural complication. FINDINGS: A total  of approximately 8 liters of clear yellow fluid was removed. Samples were sent to the laboratory as requested by the clinical team. IMPRESSION: Successful ultrasound-guided paracentesis yielding 8 liters of peritoneal fluid. Read by:  Gareth Eagle, PA-C Electronically Signed   By: Jacqulynn Cadet M.D.   On: 03/31/2017 12:59   Ir Paracentesis  Result Date: 03/30/2017 INDICATION: History of cryptogenic cirrhosis with recurrent abdominal ascites. Request is made for diagnostic and therapeutic paracentesis with a maximum of 7 L and he is to receive 25 g of albumin after the procedure. EXAM: ULTRASOUND GUIDED DIAGNOSTIC AND THERAPEUTIC PARACENTESIS MEDICATIONS: 1% lidocaine COMPLICATIONS: None immediate. PROCEDURE: Informed written consent was obtained from the patient after a discussion of the risks, benefits and alternatives to treatment. A timeout was performed prior to the initiation of the procedure. Initial ultrasound scanning demonstrates a large amount of ascites within the left lower abdominal quadrant. The left lower abdomen was prepped and draped in the usual sterile fashion. 1% lidocaine was used for local anesthesia. Following this, a 19 gauge, 7-cm, Yueh catheter was introduced. An ultrasound image was saved for documentation purposes. The paracentesis was performed. The catheter was removed and a dressing was applied. The patient tolerated the procedure well without immediate post procedural complication. FINDINGS: A total of approximately 7 L of slightly cloudy yellow fluid was removed. Samples were sent to the laboratory as requested by the clinical team. IMPRESSION: Successful ultrasound-guided paracentesis yielding 7 liters of peritoneal fluid. Read by: Saverio Danker, PA-C Electronically Signed   By: Lucrezia Europe M.D.   On: 03/30/2017 15:19   Ir Paracentesis  Result Date: 03/24/2017 INDICATION: Patient with a history of cirrhosis and recurrent abdominal ascites. Request is made for therapeutic  paracentesis today with a maximum of 5 L. He will receive albumin after the procedure as well. EXAM: ULTRASOUND GUIDED THERAPEUTIC PARACENTESIS MEDICATIONS: 1% lidocaine COMPLICATIONS: None immediate. PROCEDURE: Informed written consent was obtained from the patient after a discussion of the risks, benefits and alternatives to treatment. A timeout was performed prior to the initiation of the procedure. Initial ultrasound scanning demonstrates a large amount of ascites within the right lower abdominal quadrant. The right lower abdomen was prepped and draped in the usual sterile fashion. 1% lidocaine was used for local anesthesia. Following this, a 19 gauge, 7-cm, Yueh catheter was introduced. An ultrasound image was saved for documentation purposes. The paracentesis was performed. The catheter was removed and a dressing was applied. The patient tolerated the procedure well without immediate post procedural complication. FINDINGS: A total of approximately 5 L of yellow fluid was removed. IMPRESSION: Successful ultrasound-guided paracentesis yielding 5 liters of peritoneal fluid. Read by: Saverio Danker, PA-C CLINICAL DATA:  Shin Electronically Signed   By: Aletta Edouard M.D.   On: 03/24/2017 13:07   Ir Paracentesis  Result Date: 03/21/2017 INDICATION: Patient with history of ascites. Request is made for diagnostic and therapeutic paracentesis. EXAM: ULTRASOUND GUIDED DIAGNOSTIC AND THERAPEUTIC PARACENTESIS MEDICATIONS: 10 mL 1% lidocaine COMPLICATIONS: None immediate. PROCEDURE: Informed written consent was obtained from the patient after a discussion of the risks, benefits and alternatives to treatment. A timeout was performed prior to the initiation of the procedure. Initial ultrasound scanning demonstrates a large amount of ascites within the right lateral abdomen. The right lateral abdomen was prepped and draped in the usual sterile fashion. 1% lidocaine was used for local anesthesia.  Following this, a 19  gauge, 7-cm, Yueh catheter was introduced. An ultrasound image was saved for documentation purposes. The paracentesis was performed. The catheter was removed and a dressing was applied. The patient tolerated the procedure well without immediate post procedural complication. FINDINGS: A total of approximately 7.0 liters of clear, yellow fluid was removed. Samples were sent to the laboratory as requested by the clinical team. IMPRESSION: Successful ultrasound-guided diagnostic and therapeutic paracentesis yielding 7.0 liters of peritoneal fluid. Patient is to receive albumin post-procedure. Read by:  Brynda Greathouse PA-C Electronically Signed   By: Corrie Mckusick D.O.   On: 03/21/2017 16:25   Ir Paracentesis  Result Date: 03/14/2017 INDICATION: Cirrhosis with a history of recurrent abdominal ascites. Request is made for diagnostic and therapeutic paracentesis. A maximum of 10 L is given. EXAM: ULTRASOUND GUIDED DIAGNOSTIC AND THERAPEUTIC PARACENTESIS MEDICATIONS: 1% lidocaine COMPLICATIONS: None immediate. PROCEDURE: Informed written consent was obtained from the patient after a discussion of the risks, benefits and alternatives to treatment. A timeout was performed prior to the initiation of the procedure. Initial ultrasound scanning demonstrates a large amount of ascites within the right lower abdominal quadrant. The right lower abdomen was prepped and draped in the usual sterile fashion. 1% lidocaine was used for local anesthesia. Following this, a 19 gauge, 7-cm, Yueh catheter was introduced. An ultrasound image was saved for documentation purposes. The paracentesis was performed. The catheter was removed and a dressing was applied. The patient tolerated the procedure well without immediate post procedural complication. FINDINGS: A total of approximately 10 L of serous fluid was removed. IMPRESSION: Successful ultrasound-guided paracentesis yielding 10 liters of peritoneal fluid. Read by: Saverio Danker, PA-C  Electronically Signed   By: Corrie Mckusick D.O.   On: 03/14/2017 11:00   Ir Paracentesis  Result Date: 03/08/2017 INDICATION: Patient with recurrent ascites. Request is made for diagnostic and therapeutic paracentesis. Patient has a 7 liter maximum and is to receive albumin post-procedure. EXAM: ULTRASOUND GUIDED DIAGNOSTIC AND THERAPEUTIC PARACENTESIS MEDICATIONS: 10 mL 1% lidocaine COMPLICATIONS: None immediate. PROCEDURE: Informed written consent was obtained from the patient after a discussion of the risks, benefits and alternatives to treatment. A timeout was performed prior to the initiation of the procedure. Initial ultrasound scanning demonstrates a large amount of ascites within the left lateral abdomen. The left lateral abdomen was prepped and draped in the usual sterile fashion. 1% lidocaine was used for local anesthesia. Following this, a 19 gauge, 7-cm, Yueh catheter was introduced. An ultrasound image was saved for documentation purposes. The paracentesis was performed. The catheter was removed and a dressing was applied. The patient tolerated the procedure well without immediate post procedural complication. FINDINGS: A total of approximately 7.0 liters of clear, yellow fluid was removed. Samples were sent to the laboratory as requested by the clinical team. IMPRESSION: Successful ultrasound-guided paracentesis yielding 7.0 liters of peritoneal fluid. Patient is to receive albumin post-procedure. Read by:  Brynda Greathouse PA-C Electronically Signed   By: Marybelle Killings M.D.   On: 03/08/2017 13:37    Mercy Riding, MD 04/02/2017, 5:49 AM PGY-2, [place] Intern pager: [phone], text pages welcome

## 2017-04-02 NOTE — Progress Notes (Signed)
MD paged as Pt with continuous vomiting and threw up am meds despite zofran iv. Pt takes Midodrine which may have been thrown up as pt's BP 85/43. MD verbal order to give another dose IV zofran now, and the team will come to the bedside. Will continue to monitor.  Lucius Conn, RN

## 2017-04-02 NOTE — Progress Notes (Signed)
Orders received for more pain med, BP documented.  MD to see patient.  K pad given for comfort.  Patient vomited x 1, antiemetic administered.  Stayed and comforted patient.  Patient expresses concern for his quality of life and disease process.

## 2017-04-02 NOTE — Progress Notes (Deleted)
Subjective:  Vincent Ochoa is a 61 y.o. male who presents to the [facility] today with a chief complaint of ***.   HPI:   ***HIST  Objective:  Physical Exam: There were no vitals taken for this visit.  Gen: ***NAD, resting comfortably CV: RRR with no murmurs appreciated Pulm: NWOB, CTAB with no crackles, wheezes, or rhonchi GI: Normal bowel sounds present. Soft, Nontender, Nondistended. MSK: no edema, cyanosis, or clubbing noted Skin: warm, dry Neuro: grossly normal, moves all extremities Psych: Normal affect and thought content  Results for orders placed or performed during the hospital encounter of 03/29/17 (from the past 72 hour(s))  CBC     Status: Abnormal   Collection Time: 03/31/17  5:50 AM  Result Value Ref Range   WBC 5.6 4.0 - 10.5 K/uL   RBC 2.75 (L) 4.22 - 5.81 MIL/uL   Hemoglobin 8.6 (L) 13.0 - 17.0 g/dL   HCT 25.2 (L) 39.0 - 52.0 %   MCV 91.6 78.0 - 100.0 fL   MCH 31.3 26.0 - 34.0 pg   MCHC 34.1 30.0 - 36.0 g/dL   RDW 13.9 11.5 - 15.5 %   Platelets 55 (L) 150 - 400 K/uL    Comment: SPECIMEN CHECKED FOR CLOTS REPEATED TO VERIFY CONSISTENT WITH PREVIOUS RESULT   Comprehensive metabolic panel     Status: Abnormal   Collection Time: 03/31/17  5:50 AM  Result Value Ref Range   Sodium 127 (L) 135 - 145 mmol/L   Potassium 4.8 3.5 - 5.1 mmol/L   Chloride 105 101 - 111 mmol/L   CO2 13 (L) 22 - 32 mmol/L   Glucose, Bld 134 (H) 65 - 99 mg/dL   BUN 53 (H) 6 - 20 mg/dL   Creatinine, Ser 2.19 (H) 0.61 - 1.24 mg/dL   Calcium 8.3 (L) 8.9 - 10.3 mg/dL   Total Protein 5.7 (L) 6.5 - 8.1 g/dL   Albumin 2.8 (L) 3.5 - 5.0 g/dL   AST 36 15 - 41 U/L   ALT 33 17 - 63 U/L   Alkaline Phosphatase 90 38 - 126 U/L   Total Bilirubin 1.1 0.3 - 1.2 mg/dL   GFR calc non Af Amer 31 (L) >60 mL/min   GFR calc Af Amer 36 (L) >60 mL/min    Comment: (NOTE) The eGFR has been calculated using the CKD EPI equation. This calculation has not been validated in all clinical  situations. eGFR's persistently <60 mL/min signify possible Chronic Kidney Disease.    Anion gap 9 5 - 15  Protime-INR     Status: Abnormal   Collection Time: 03/31/17  5:50 AM  Result Value Ref Range   Prothrombin Time 20.1 (H) 11.4 - 15.2 seconds   INR 1.69   Lactate dehydrogenase     Status: None   Collection Time: 03/31/17  7:49 AM  Result Value Ref Range   LDH 178 98 - 192 U/L  Lactate dehydrogenase (pleural or peritoneal fluid)     Status: Abnormal   Collection Time: 03/31/17 12:28 PM  Result Value Ref Range   LD, Fluid 72 (H) 3 - 23 U/L    Comment: (NOTE) Results should be evaluated in conjunction with serum values    Fluid Type-FLDH FLUID     Comment: PERITONEAL CORRECTED ON 05/11 AT 1235: PREVIOUSLY REPORTED AS Peritoneal   Body fluid cell count with differential     Status: Abnormal   Collection Time: 03/31/17 12:28 PM  Result Value Ref Range  Fluid Type-FCT FLUID     Comment: PERITONEAL CORRECTED ON 05/11 AT 1235: PREVIOUSLY REPORTED AS Peritoneal    Color, Fluid YELLOW (A) YELLOW   Appearance, Fluid HAZY (A) CLEAR   WBC, Fluid 1,560 (H) 0 - 1,000 cu mm   Neutrophil Count, Fluid 33 (H) 0 - 25 %   Lymphs, Fluid 7 %   Monocyte-Macrophage-Serous Fluid 60 50 - 90 %   Eos, Fluid 0 %  Albumin, pleural or peritoneal fluid     Status: None   Collection Time: 03/31/17 12:28 PM  Result Value Ref Range   Albumin, Fluid <1.0 g/dL    Comment: REPEATED TO VERIFY (NOTE) No normal range established for this test Results should be evaluated in conjunction with serum values    Fluid Type-FALB FLUID     Comment: PERITONEAL CORRECTED ON 05/11 AT 1237: PREVIOUSLY REPORTED AS Peritoneal   Protein, pleural or peritoneal fluid     Status: None   Collection Time: 03/31/17 12:28 PM  Result Value Ref Range   Total protein, fluid <3.0 g/dL    Comment: REPEATED TO VERIFY (NOTE) No normal range established for this test Results should be evaluated in conjunction with serum  values    Fluid Type-FTP FLUID     Comment: PERITONEAL CORRECTED ON 05/11 AT 1235: PREVIOUSLY REPORTED AS Peritoneal   Glucose, pleural or peritoneal fluid     Status: None   Collection Time: 03/31/17 12:28 PM  Result Value Ref Range   Glucose, Fluid 105 mg/dL    Comment: (NOTE) No normal range established for this test Results should be evaluated in conjunction with serum values    Fluid Type-FGLU FLUID     Comment: PERITONEAL CORRECTED ON 05/11 AT 1235: PREVIOUSLY REPORTED AS Peritoneal   Culture, body fluid-bottle     Status: None (Preliminary result)   Collection Time: 03/31/17 12:28 PM  Result Value Ref Range   Specimen Description PERITONEAL    Special Requests NONE    Culture NO GROWTH 2 DAYS    Report Status PENDING   Gram stain     Status: None   Collection Time: 03/31/17 12:28 PM  Result Value Ref Range   Specimen Description PERITONEAL    Special Requests NONE    Gram Stain      MODERATE WBC PRESENT,BOTH PMN AND MONONUCLEAR NO ORGANISMS SEEN    Report Status 03/31/2017 FINAL   Basic metabolic panel     Status: Abnormal   Collection Time: 03/31/17  3:50 PM  Result Value Ref Range   Sodium 126 (L) 135 - 145 mmol/L   Potassium 4.7 3.5 - 5.1 mmol/L   Chloride 104 101 - 111 mmol/L   CO2 15 (L) 22 - 32 mmol/L   Glucose, Bld 123 (H) 65 - 99 mg/dL   BUN 52 (H) 6 - 20 mg/dL   Creatinine, Ser 2.05 (H) 0.61 - 1.24 mg/dL   Calcium 8.4 (L) 8.9 - 10.3 mg/dL   GFR calc non Af Amer 33 (L) >60 mL/min   GFR calc Af Amer 39 (L) >60 mL/min    Comment: (NOTE) The eGFR has been calculated using the CKD EPI equation. This calculation has not been validated in all clinical situations. eGFR's persistently <60 mL/min signify possible Chronic Kidney Disease.    Anion gap 7 5 - 15  Comprehensive metabolic panel     Status: Abnormal   Collection Time: 04/01/17  3:00 AM  Result Value Ref Range   Sodium 129 (L) 135 -  145 mmol/L   Potassium 4.9 3.5 - 5.1 mmol/L   Chloride 106  101 - 111 mmol/L   CO2 14 (L) 22 - 32 mmol/L   Glucose, Bld 88 65 - 99 mg/dL   BUN 51 (H) 6 - 20 mg/dL   Creatinine, Ser 2.08 (H) 0.61 - 1.24 mg/dL   Calcium 8.3 (L) 8.9 - 10.3 mg/dL   Total Protein 5.7 (L) 6.5 - 8.1 g/dL   Albumin 3.3 (L) 3.5 - 5.0 g/dL   AST 36 15 - 41 U/L   ALT 27 17 - 63 U/L   Alkaline Phosphatase 78 38 - 126 U/L   Total Bilirubin 0.9 0.3 - 1.2 mg/dL   GFR calc non Af Amer 33 (L) >60 mL/min   GFR calc Af Amer 38 (L) >60 mL/min    Comment: (NOTE) The eGFR has been calculated using the CKD EPI equation. This calculation has not been validated in all clinical situations. eGFR's persistently <60 mL/min signify possible Chronic Kidney Disease.    Anion gap 9 5 - 15  CBC     Status: Abnormal   Collection Time: 04/01/17  3:00 AM  Result Value Ref Range   WBC 3.5 (L) 4.0 - 10.5 K/uL   RBC 2.43 (L) 4.22 - 5.81 MIL/uL   Hemoglobin 7.5 (L) 13.0 - 17.0 g/dL   HCT 21.9 (L) 39.0 - 52.0 %   MCV 90.1 78.0 - 100.0 fL   MCH 30.9 26.0 - 34.0 pg   MCHC 34.2 30.0 - 36.0 g/dL   RDW 13.4 11.5 - 15.5 %   Platelets 43 (L) 150 - 400 K/uL    Comment: CONSISTENT WITH PREVIOUS RESULT  Lactic acid, plasma     Status: None   Collection Time: 04/01/17  3:00 AM  Result Value Ref Range   Lactic Acid, Venous 1.8 0.5 - 1.9 mmol/L  CBC     Status: Abnormal   Collection Time: 04/01/17  8:04 AM  Result Value Ref Range   WBC 3.1 (L) 4.0 - 10.5 K/uL   RBC 2.59 (L) 4.22 - 5.81 MIL/uL   Hemoglobin 8.0 (L) 13.0 - 17.0 g/dL   HCT 23.2 (L) 39.0 - 52.0 %   MCV 89.6 78.0 - 100.0 fL   MCH 30.9 26.0 - 34.0 pg   MCHC 34.5 30.0 - 36.0 g/dL   RDW 13.5 11.5 - 15.5 %   Platelets 29 (LL) 150 - 400 K/uL    Comment: SPECIMEN CHECKED FOR CLOTS REPEATED TO VERIFY PLATELET COUNT CONFIRMED BY SMEAR CRITICAL RESULT CALLED TO, READ BACK BY AND VERIFIED WITH: J LINDSAY,RN 04/01/17 0915 RHOLMES   Lactic acid, plasma     Status: None   Collection Time: 04/01/17  8:04 AM  Result Value Ref Range   Lactic  Acid, Venous 1.3 0.5 - 1.9 mmol/L  Lactic acid, plasma     Status: None   Collection Time: 04/01/17 10:41 AM  Result Value Ref Range   Lactic Acid, Venous 1.1 0.5 - 1.9 mmol/L  Protime-INR     Status: Abnormal   Collection Time: 04/01/17 12:06 PM  Result Value Ref Range   Prothrombin Time 19.0 (H) 11.4 - 15.2 seconds   INR 1.58   Comprehensive metabolic panel     Status: Abnormal   Collection Time: 04/02/17  2:30 AM  Result Value Ref Range   Sodium 127 (L) 135 - 145 mmol/L   Potassium 5.6 (H) 3.5 - 5.1 mmol/L   Chloride 103 101 - 111  mmol/L   CO2 16 (L) 22 - 32 mmol/L   Glucose, Bld 111 (H) 65 - 99 mg/dL   BUN 49 (H) 6 - 20 mg/dL   Creatinine, Ser 2.28 (H) 0.61 - 1.24 mg/dL   Calcium 8.4 (L) 8.9 - 10.3 mg/dL   Total Protein 6.0 (L) 6.5 - 8.1 g/dL   Albumin 3.2 (L) 3.5 - 5.0 g/dL   AST 40 15 - 41 U/L   ALT 32 17 - 63 U/L   Alkaline Phosphatase 90 38 - 126 U/L   Total Bilirubin 1.7 (H) 0.3 - 1.2 mg/dL   GFR calc non Af Amer 29 (L) >60 mL/min   GFR calc Af Amer 34 (L) >60 mL/min    Comment: (NOTE) The eGFR has been calculated using the CKD EPI equation. This calculation has not been validated in all clinical situations. eGFR's persistently <60 mL/min signify possible Chronic Kidney Disease.    Anion gap 8 5 - 15  CBC     Status: Abnormal   Collection Time: 04/02/17  2:30 AM  Result Value Ref Range   WBC 4.1 4.0 - 10.5 K/uL   RBC 2.84 (L) 4.22 - 5.81 MIL/uL   Hemoglobin 9.2 (L) 13.0 - 17.0 g/dL   HCT 25.8 (L) 39.0 - 52.0 %   MCV 90.8 78.0 - 100.0 fL   MCH 32.4 26.0 - 34.0 pg   MCHC 35.7 30.0 - 36.0 g/dL   RDW 13.7 11.5 - 15.5 %   Platelets 53 (L) 150 - 400 K/uL    Comment: CONSISTENT WITH PREVIOUS RESULT  Protime-INR     Status: Abnormal   Collection Time: 04/02/17  2:30 AM  Result Value Ref Range   Prothrombin Time 19.4 (H) 11.4 - 15.2 seconds   INR 1.62   Lactic acid, plasma     Status: None   Collection Time: 04/02/17  2:30 AM  Result Value Ref Range   Lactic  Acid, Venous 1.6 0.5 - 1.9 mmol/L     Assessment/Plan:  No problem-specific Assessment & Plan notes found for this encounter.   Bufford Lope, DO PGY-***, [facility] 04/02/2017 9:55 PM

## 2017-04-02 NOTE — Progress Notes (Addendum)
**  Interval Note**  Patient with persistent nausea/ vomiting despite Zofran 4mg  IV x2.  His wife is at bedside and notes that he normally takes Promethazine at home for N/V and responds better to it.  Promethazine 12.5mg  IV ordered x1.  Asked RN to attempt to give Midodrine prior to transport if N/V responds well to this.  BP at bedside stable 96/53.  His trend has been between (74-103)/(38-58).  Per RN report EMS in route for transport to [facility].  Blood pressure (!) 96/53, pulse 78, temperature 97.6 F (36.4 C), temperature source Oral, resp. rate 15, height 6\' 4"  (1.93 m), weight 230 lb 12.8 oz (104.7 kg), SpO2 100 %. Gen: awake, lying on left side spitting in a bag.  No active vomiting during my examination.  Family at bedside Abd: distended  Ashly M. Lajuana Ripple, DO PGY-3, [facility]

## 2017-04-02 NOTE — Progress Notes (Signed)
Report given to [facility] and [facility]. Phenergan given as ordered. Pt vomiting ceased for about 5 minutes. Midodrine given. Patient transferred to [facility] by way of [facility]. VSS.   Lucius Conn, RN

## 2017-04-02 NOTE — Progress Notes (Signed)
Notified the on call physician of abdominal pain 10/10, and no relief with already ordered pain med.

## 2017-04-03 ENCOUNTER — Inpatient Hospital Stay: Payer: Medicare HMO | Admitting: Family Medicine

## 2017-04-03 LAB — CULTURE, BLOOD (ROUTINE X 2)
CULTURE: NO GROWTH
Culture: NO GROWTH
SPECIAL REQUESTS: ADEQUATE
Special Requests: ADEQUATE

## 2017-04-03 LAB — PATHOLOGIST SMEAR REVIEW

## 2017-04-04 ENCOUNTER — Ambulatory Visit (HOSPITAL_COMMUNITY): Admission: RE | Admit: 2017-04-04 | Payer: Medicaid Other | Source: Ambulatory Visit

## 2017-04-04 LAB — CULTURE, BODY FLUID-BOTTLE

## 2017-04-04 LAB — CULTURE, BODY FLUID W GRAM STAIN -BOTTLE: Culture: NO GROWTH

## 2017-04-05 LAB — CULTURE, BODY FLUID-BOTTLE: CULTURE: NO GROWTH

## 2017-04-05 LAB — CULTURE, BODY FLUID W GRAM STAIN -BOTTLE

## 2017-04-06 ENCOUNTER — Ambulatory Visit (HOSPITAL_COMMUNITY): Payer: Medicaid Other

## 2017-04-10 DIAGNOSIS — R188 Other ascites: Secondary | ICD-10-CM | POA: Diagnosis not present

## 2017-04-10 DIAGNOSIS — K7469 Other cirrhosis of liver: Secondary | ICD-10-CM | POA: Diagnosis not present

## 2017-04-10 DIAGNOSIS — G4733 Obstructive sleep apnea (adult) (pediatric): Secondary | ICD-10-CM | POA: Diagnosis not present

## 2017-04-10 DIAGNOSIS — I480 Paroxysmal atrial fibrillation: Secondary | ICD-10-CM | POA: Diagnosis not present

## 2017-04-10 DIAGNOSIS — Z01818 Encounter for other preprocedural examination: Secondary | ICD-10-CM | POA: Diagnosis not present

## 2017-04-10 DIAGNOSIS — K7290 Hepatic failure, unspecified without coma: Secondary | ICD-10-CM | POA: Diagnosis not present

## 2017-04-10 DIAGNOSIS — D649 Anemia, unspecified: Secondary | ICD-10-CM | POA: Diagnosis not present

## 2017-04-11 ENCOUNTER — Other Ambulatory Visit (HOSPITAL_COMMUNITY): Payer: Self-pay | Admitting: Gastroenterology

## 2017-04-11 ENCOUNTER — Encounter (HOSPITAL_COMMUNITY): Payer: Self-pay | Admitting: Student

## 2017-04-11 ENCOUNTER — Telehealth: Payer: Self-pay | Admitting: Family Medicine

## 2017-04-11 ENCOUNTER — Ambulatory Visit (HOSPITAL_COMMUNITY)
Admission: RE | Admit: 2017-04-11 | Discharge: 2017-04-11 | Disposition: A | Discharge status: Home / Self Care | Payer: Medicare HMO | Source: Ambulatory Visit | Attending: Gastroenterology | Admitting: Gastroenterology

## 2017-04-11 DIAGNOSIS — K7460 Unspecified cirrhosis of liver: Secondary | ICD-10-CM | POA: Diagnosis not present

## 2017-04-11 DIAGNOSIS — K7031 Alcoholic cirrhosis of liver with ascites: Secondary | ICD-10-CM

## 2017-04-11 DIAGNOSIS — R161 Splenomegaly, not elsewhere classified: Secondary | ICD-10-CM | POA: Diagnosis not present

## 2017-04-11 DIAGNOSIS — R188 Other ascites: Secondary | ICD-10-CM | POA: Diagnosis not present

## 2017-04-11 DIAGNOSIS — N200 Calculus of kidney: Secondary | ICD-10-CM | POA: Diagnosis not present

## 2017-04-11 HISTORY — PX: IR PARACENTESIS: IMG2679

## 2017-04-11 LAB — BODY FLUID CELL COUNT WITH DIFFERENTIAL
LYMPHS FL: 47 %
MONOCYTE-MACROPHAGE-SEROUS FLUID: 49 % — AB (ref 50–90)
Neutrophil Count, Fluid: 4 % (ref 0–25)
Total Nucleated Cell Count, Fluid: 134 cu mm (ref 0–1000)

## 2017-04-11 LAB — ALBUMIN, PLEURAL OR PERITONEAL FLUID: Albumin, Fluid: <1 g/dL

## 2017-04-11 MED ORDER — ALBUMIN HUMAN 25 % IV SOLN
50.0000 g | Freq: Once | INTRAVENOUS | Status: AC
  Administered 2017-04-11: 50 g via INTRAVENOUS
  Filled 2017-04-11: qty 200

## 2017-04-11 MED ORDER — LIDOCAINE HCL (PF) 1 % IJ SOLN
INTRAMUSCULAR | Status: DC | PRN
  Administered 2017-04-11: 10 mL

## 2017-04-11 NOTE — Telephone Encounter (Signed)
Received the release form from probation officer. I will bring this to clinic to scan. I will also bring two copies of the letter and place them in Dr. Lora Havens box for their appointment tomorrow. Please ask [name] to take one copy to their probation officer as he does not have a fax machine.

## 2017-04-11 NOTE — Telephone Encounter (Signed)
Patient wife wanted to leave a message for Dr. Lindell Noe although on different team.  Please call patient about his hospital stay when she saw him, # [phone] [name]).  thanks

## 2017-04-11 NOTE — Telephone Encounter (Signed)
Called Linda back. She is requesting a letter for his probation officer and the court detailing how ill Vincent Ochoa is and his limited prognosis should he not receive a liver transplant. I asked to contact his probation officer and Vincent Ochoa gave verbal consent for this. Vincent Ochoa is his probation officer [phone]. He states that he has a consent form from Vincent Ochoa to share medical information. He will email this form to me before I write any such letter. I told Vincent Ochoa that I would try to have the letter ready for her to pick up at her appt with Vincent Ochoa tomorrow. She says she needed me to write this letter as I cared for Vincent Ochoa at his last hospitalization.

## 2017-04-11 NOTE — Procedures (Signed)
PROCEDURE SUMMARY:  Successful US guided paracentesis from right lateral abdomen.  Yielded 9.6 liters of clear, yellow fluid.  No immediate complications.  Pt tolerated well.   Specimen was sent for labs.  Patient is to receive 50g albumin post-procedure.   Docia Barrier PA-C 04/11/2017 10:51 AM

## 2017-04-11 NOTE — Telephone Encounter (Signed)
Will forward to Dr. Lindell Noe. Veatrice Eckstein,CMA

## 2017-04-12 ENCOUNTER — Encounter: Payer: Self-pay | Admitting: Family Medicine

## 2017-04-12 ENCOUNTER — Ambulatory Visit (INDEPENDENT_AMBULATORY_CARE_PROVIDER_SITE_OTHER): Payer: Medicare HMO | Admitting: Family Medicine

## 2017-04-12 VITALS — BP 98/50 | HR 88 | Temp 98.1°F | Wt 220.0 lb

## 2017-04-12 DIAGNOSIS — K641 Second degree hemorrhoids: Secondary | ICD-10-CM | POA: Diagnosis not present

## 2017-04-12 LAB — POCT HEMOGLOBIN: HEMOGLOBIN: 8.9 g/dL — AB (ref 14.1–18.1)

## 2017-04-12 MED ORDER — PRAMOXINE HCL 1 % RE FOAM: 1.0000 "application " | Freq: Three times a day (TID) | RECTAL | 0 refills | Status: DC | PRN

## 2017-04-12 MED ORDER — DOCUSATE SODIUM 100 MG PO CAPS: 100.0000 mg | ORAL_CAPSULE | Freq: Two times a day (BID) | ORAL | 0 refills | Status: AC

## 2017-04-12 MED ORDER — DIBUCAINE 1 % EX OINT: TOPICAL_OINTMENT | Freq: Three times a day (TID) | CUTANEOUS | 0 refills | Status: DC | PRN

## 2017-04-12 NOTE — Assessment & Plan Note (Signed)
Per colonoscopy on 04/07/17 "External and internal hemorrhoids were found during retroflexion and during perianal exam. The hemorrhoids were large and Grade II (internal hemorrhoids that prolapse but reduce spontaneously)." - Add colace to patient's lactulose - Advised proctofoam for symptomatic relief with addition of nupercainal sparingly

## 2017-04-12 NOTE — Progress Notes (Signed)
    Subjective:  Vincent Ochoa is a 61 y.o. male who presents to the [facility] today with a chief complaint of hemorrhoids.   HPI:  Patient states has been dealing with hemorrhoids for some time but has failed multiple conservative treatments at home. States were seen on recent colonoscopy on 04/07/17. Has since tried warm baths, preparation H, is compliant on his home lactulose. Asked his doctors at [facility] who recommended he follow up with PCP and inquire about symptomatic management with topical agents to decrease local pain. States no bleeding unless he is having a bowel movement, amount of bleeding with BMs is moderate. No lightheadess/dizziness, SOB, CP, palpitations. States abdominal pain under good control.   ROS: Per HPI  Objective:  Physical Exam: BP (!) 98/50   Pulse 88   Temp 98.1 F (36.7 C) (Oral)   Wt 220 lb (99.8 kg)   SpO2 97%   BMI 26.78 kg/m   Gen: chronically ill appearing, NAD, resting comfortably. CV: RRR with no murmurs appreciated Pulm: NWOB, CTAB with no crackles, wheezes, or rhonchi GI: distended, nontender. MSK: no edema, cyanosis, or clubbing noted Skin: warm, dry with spider angiomata in multiple areas Neuro: grossly normal, moves all extremities Psych: Normal affect and thought content  Results for orders placed or performed during the hospital encounter of 04/11/17 (from the past 72 hour(s))  Body fluid cell count with differential     Status: Abnormal   Collection Time: 04/11/17 10:40 AM  Result Value Ref Range   Fluid Type-FCT Peritoneal    Color, Fluid YELLOW YELLOW   Appearance, Fluid HAZY (A) CLEAR   WBC, Fluid 134 0 - 1,000 cu mm   Neutrophil Count, Fluid 4 0 - 25 %   Lymphs, Fluid 47 %   Monocyte-Macrophage-Serous Fluid 49 (L) 50 - 90 %  Albumin, pleural or peritoneal fluid     Status: None   Collection Time: 04/11/17 10:40 AM  Result Value Ref Range   Albumin, Fluid <1.0 g/dL    Comment: (NOTE) No normal range established for this  test Results should be evaluated in conjunction with serum values    Fluid Type-FALB Peritoneal      Assessment/Plan:  Grade II hemorrhoids Per colonoscopy on 04/07/17 "External and internal hemorrhoids were found during retroflexion and during perianal exam. The hemorrhoids were large and Grade II (internal hemorrhoids that prolapse but reduce spontaneously)." - Add colace to patient's lactulose - Advised proctofoam for symptomatic relief with addition of nupercainal sparingly    Bufford Lope, DO PGY-1, [facility] 04/12/2017 11:14 AM

## 2017-04-12 NOTE — Patient Instructions (Signed)
It was great to see you again  For your hemorrhoids, - Add a "musher" to keep your stools soft called colace. Keep doing the lactulose, it is a good "pusher" - Try proctofoam for some relief, if that doesn't work you can a little nupercainal (try to use sparingly).  We are checking some labs today, and someone will call you or send you a letter with the results when they are available.   Take care and seek immediate care sooner if you develop any concerns.   Dr. Bufford Lope, [place]

## 2017-04-13 ENCOUNTER — Ambulatory Visit (HOSPITAL_COMMUNITY): Payer: Medicaid Other

## 2017-04-13 ENCOUNTER — Encounter (HOSPITAL_COMMUNITY): Payer: Self-pay

## 2017-04-14 ENCOUNTER — Ambulatory Visit (INDEPENDENT_AMBULATORY_CARE_PROVIDER_SITE_OTHER): Payer: Medicare HMO | Admitting: Family Medicine

## 2017-04-14 VITALS — BP 86/48 | HR 94 | Temp 98.0°F | Wt 225.0 lb

## 2017-04-14 DIAGNOSIS — K625 Hemorrhage of anus and rectum: Secondary | ICD-10-CM

## 2017-04-14 DIAGNOSIS — I959 Hypotension, unspecified: Secondary | ICD-10-CM | POA: Diagnosis not present

## 2017-04-14 DIAGNOSIS — K641 Second degree hemorrhoids: Secondary | ICD-10-CM | POA: Diagnosis not present

## 2017-04-14 LAB — POCT HEMOGLOBIN: Hemoglobin: 9 g/dL — AB (ref 14.1–18.1)

## 2017-04-14 MED ORDER — DIBUCAINE 1 % EX OINT: TOPICAL_OINTMENT | Freq: Three times a day (TID) | CUTANEOUS | 0 refills | Status: AC | PRN

## 2017-04-14 NOTE — Patient Instructions (Addendum)
You hemoglobin is at your baseline.  This is a good thing.  If your symptoms worsen, I recommend that you go to the ED.  In the meantime, you should continue with the rectal creams/ foams.  I recommend that you increase your Lactulose to 50mL THREE times a day.  This should help stimulate your bowel.  Continue sitz baths several times daily.  How to Take a Sitz Bath A sitz bath is a warm water bath that is taken while you are sitting down. The water should only come up to your hips and should cover your buttocks. Your health care provider may recommend a sitz bath to help you:  Clean the lower part of your body, including your genital area.  With itching.  With pain.  With sore muscles or muscles that tighten or spasm. How to take a sitz bath Take 3-4 sitz baths per day or as told by your health care provider. 1. Partially fill a bathtub with warm water. You will only need the water to be deep enough to cover your hips and buttocks when you are sitting in it. 2. If your health care provider told you to put medicine in the water, follow the directions exactly. 3. Sit in the water and open the tub drain a little. 4. Turn on the warm water again to keep the tub at the correct level. Keep the water running constantly. 5. Soak in the water for 15-20 minutes or as told by your health care provider. 6. After the sitz bath, pat the affected area dry first. Do not rub it. 7. Be careful when you stand up after the sitz bath because you may feel dizzy. Contact a health care provider if:  Your symptoms get worse. Do not continue with sitz baths if your symptoms get worse.  You have new symptoms. Do not continue with sitz baths until you talk with your health care provider. This information is not intended to replace advice given to you by your health care provider. Make sure you discuss any questions you have with your health care provider. Document Released: 07/30/2004 Document Revised: 04/06/2016  Document Reviewed: 11/05/2014 Elsevier Interactive Patient Education  2017 [employer]    Hemorrhoids Hemorrhoids are swollen veins in and around the rectum or anus. There are two types of hemorrhoids:  Internal hemorrhoids. These occur in the veins that are just inside the rectum. They may poke through to the outside and become irritated and painful.  External hemorrhoids. These occur in the veins that are outside of the anus and can be felt as a painful swelling or hard lump near the anus. Most hemorrhoids do not cause serious problems, and they can be managed with home treatments such as diet and lifestyle changes. If home treatments do not help your symptoms, procedures can be done to shrink or remove the hemorrhoids. What are the causes? This condition is caused by increased pressure in the anal area. This pressure may result from various things, including:  Constipation.  Straining to have a bowel movement.  Diarrhea.  Pregnancy.  Obesity.  Sitting for long periods of time.  Heavy lifting or other activity that causes you to strain.  Anal sex. What are the signs or symptoms? Symptoms of this condition include:  Pain.  Anal itching or irritation.  Rectal bleeding.  Leakage of stool (feces).  Anal swelling.  One or more lumps around the anus. How is this diagnosed? This condition can often be diagnosed through a visual  exam. Other exams or tests may also be done, such as:  Examination of the rectal area with a gloved hand (digital rectal exam).  Examination of the anal canal using a small tube (anoscope).  A blood test, if you have lost a significant amount of blood.  A test to look inside the colon (sigmoidoscopy or colonoscopy). How is this treated? This condition can usually be treated at home. However, various procedures may be done if dietary changes, lifestyle changes, and other home treatments do not help your symptoms. These procedures can help  make the hemorrhoids smaller or remove them completely. Some of these procedures involve surgery, and others do not. Common procedures include:  Rubber band ligation. Rubber bands are placed at the base of the hemorrhoids to cut off the blood supply to them.  Sclerotherapy. Medicine is injected into the hemorrhoids to shrink them.  Infrared coagulation. A type of light energy is used to get rid of the hemorrhoids.  Hemorrhoidectomy surgery. The hemorrhoids are surgically removed, and the veins that supply them are tied off.  Stapled hemorrhoidopexy surgery. A circular stapling device is used to remove the hemorrhoids and use staples to cut off the blood supply to them. Follow these instructions at home: Eating and drinking   Eat foods that have a lot of fiber in them, such as whole grains, beans, nuts, fruits, and vegetables. Ask your health care provider about taking products that have added fiber (fiber supplements).  Drink enough fluid to keep your urine clear or pale yellow. Managing pain and swelling   Take warm sitz baths for 20 minutes, 3-4 times a day to ease pain and discomfort.  If directed, apply ice to the affected area. Using ice packs between sitz baths may be helpful.  Put ice in a plastic bag.  Place a towel between your skin and the bag.  Leave the ice on for 20 minutes, 2-3 times a day. General instructions   Take over-the-counter and prescription medicines only as told by your health care provider.  Use medicated creams or suppositories as told.  Exercise regularly.  Go to the bathroom when you have the urge to have a bowel movement. Do not wait.  Avoid straining to have bowel movements.  Keep the anal area dry and clean. Use wet toilet paper or moist towelettes after a bowel movement.  Do not sit on the toilet for long periods of time. This increases blood pooling and pain. Contact a health care provider if:  You have increasing pain and swelling that  are not controlled by treatment or medicine.  You have uncontrolled bleeding.  You have difficulty having a bowel movement, or you are unable to have a bowel movement.  You have pain or inflammation outside the area of the hemorrhoids. This information is not intended to replace advice given to you by your health care provider. Make sure you discuss any questions you have with your health care provider. Document Released: 11/04/2000 Document Revised: 04/06/2016 Document Reviewed: 07/22/2015 Elsevier Interactive Patient Education  2017 [employer].

## 2017-04-14 NOTE — Progress Notes (Signed)
Subjective: CC: hemorrhoids Vincent Ochoa is a 61 y.o. male presenting to clinic today for same day appointment. PCP: Bufford Lope, DO Concerns today include:  1. Hemorrhoids Patient seen 5/23 for same.  He had proctofoam and nupercainal added to colace and sitz baths.  Patient reports that since that appt his bottom has continued to uncomfortable.  He notes that medications prescribed do not seem to be helping much.  He is NOT performing sitz baths at home.  He notes that rectal bleeding seems more abundant now than previous.  He reports poor BMs, citing that he had 2 small pellet stools this am despite TID use of Lactulose.  He denies vomiting, mental fogginess.  He does report some malaise.  He also notes he has not taken his Midodrine this am.  Allergies  Allergen Reactions  . Asa [Aspirin] Other (See Comments)    Other reaction(s): Other (See Comments) Previous hemmorhage Bleeding Previous hemmorhage  . Eliquis [Apixaban] Other (See Comments)    bleeding Potentiality of internal bleeding  . Nsaids Other (See Comments) and Anaphylaxis    May cause bleeding issues (per wife) May only have 1 plain Tylenol every 8 hours is needed for flu symptoms, per doctor.  . Plavix [Clopidogrel] Other (See Comments)    Bleeding Potentiality of internal bleeding  . Statins Other (See Comments)    Other reaction(s): Other (See Comments) Potentially may make patient bleed internally (per wife) Potentially may make patient bleed internally (per wife)  . Tolmetin     Other reaction(s): Other (See Comments) May cause bleeding issues (per wife) May only have 1 plain Tylenol every 8 hours is needed for flu symptoms, per doctor.    Social Hx reviewed. MedHx, current medications and allergies reviewed.  Please see EMR. ROS: Per HPI  Objective: Office vital signs reviewed. BP (!) 86/48   Pulse 94   Temp 98 F (36.7 C) (Oral)   Wt 225 lb (102.1 kg)   SpO2 92%   BMI 27.39 kg/m    Physical Examination:  General: Awake, alert, chronically ill appearing, No acute distress Cardio: regular rate Pulm: normal work of breathing on room air GI: distended  Rectal exam: Patient examined leaning forward supervised by Dr Andria Frames. A 1.5 cm tender hemorrhoid at the 3 o'clock position appreciated. No frank rectal bleeding seen.  No fissure appreciated.  DRE not performed secondary to external rectal pain.  Results for orders placed or performed in visit on 04/14/17 (from the past 24 hour(s))  POCT hemoglobin     Status: Abnormal   Collection Time: 04/14/17 11:37 AM  Result Value Ref Range   Hemoglobin 9.0 (A) 14.1 - 18.1 g/dL   Assessment/ Plan: 61 y.o. male   1. Rectal bleeding.  Hgb stable and at baseline.  I suspect that bleeding is from hemorrhoids.  INR 04/02/2017 reviewed and was 1.62. - Reassurance.  - Home care instructions for hemorrhoid reviewed - POCT hemoglobin - Reasons to seek care in ED reviewed  2. Grade II hemorrhoids.  Patient not a good candidate for surgical intervention given easy bleeding 2/2 cirrhosis.  Continue current hemorrhoid regimen - Increase Lactulose to 45cc QID. - Reviewed sitz bath with patient.  If he is unable to get in and out of bath independently, can use warm wash clothes several times daily. - Refilled dibucaine (NUPERCAINAL) 1 % ointment; Apply topically 3 (three) times daily as needed for pain.  Dispense: 30 g; Refill: 0  3. Hypotension, unspecified hypotension  type - Instructed to take Midodrine when he gets home.  Follow up with PCP next week if persistent.  Janora Norlander, DO PGY-3, [facility]

## 2017-04-18 ENCOUNTER — Other Ambulatory Visit (HOSPITAL_COMMUNITY): Payer: Self-pay | Admitting: Gastroenterology

## 2017-04-18 ENCOUNTER — Encounter (HOSPITAL_COMMUNITY): Payer: Self-pay | Admitting: Student

## 2017-04-18 ENCOUNTER — Ambulatory Visit (HOSPITAL_COMMUNITY)
Admission: RE | Admit: 2017-04-18 | Discharge: 2017-04-18 | Disposition: A | Discharge status: Home / Self Care | Payer: Medicare HMO | Source: Ambulatory Visit | Attending: Gastroenterology | Admitting: Gastroenterology

## 2017-04-18 DIAGNOSIS — E871 Hypo-osmolality and hyponatremia: Secondary | ICD-10-CM | POA: Diagnosis not present

## 2017-04-18 DIAGNOSIS — K7031 Alcoholic cirrhosis of liver with ascites: Secondary | ICD-10-CM

## 2017-04-18 DIAGNOSIS — K766 Portal hypertension: Secondary | ICD-10-CM | POA: Diagnosis present

## 2017-04-18 DIAGNOSIS — D638 Anemia in other chronic diseases classified elsewhere: Secondary | ICD-10-CM | POA: Diagnosis present

## 2017-04-18 DIAGNOSIS — E440 Moderate protein-calorie malnutrition: Secondary | ICD-10-CM | POA: Diagnosis present

## 2017-04-18 DIAGNOSIS — K652 Spontaneous bacterial peritonitis: Secondary | ICD-10-CM | POA: Diagnosis not present

## 2017-04-18 DIAGNOSIS — I480 Paroxysmal atrial fibrillation: Secondary | ICD-10-CM | POA: Diagnosis present

## 2017-04-18 DIAGNOSIS — K625 Hemorrhage of anus and rectum: Secondary | ICD-10-CM | POA: Diagnosis not present

## 2017-04-18 DIAGNOSIS — F4310 Post-traumatic stress disorder, unspecified: Secondary | ICD-10-CM | POA: Diagnosis not present

## 2017-04-18 DIAGNOSIS — R0600 Dyspnea, unspecified: Secondary | ICD-10-CM | POA: Diagnosis not present

## 2017-04-18 DIAGNOSIS — K920 Hematemesis: Secondary | ICD-10-CM | POA: Diagnosis present

## 2017-04-18 DIAGNOSIS — G9340 Encephalopathy, unspecified: Secondary | ICD-10-CM | POA: Diagnosis not present

## 2017-04-18 DIAGNOSIS — G92 Toxic encephalopathy: Secondary | ICD-10-CM | POA: Diagnosis not present

## 2017-04-18 DIAGNOSIS — K767 Hepatorenal syndrome: Secondary | ICD-10-CM | POA: Diagnosis present

## 2017-04-18 DIAGNOSIS — N184 Chronic kidney disease, stage 4 (severe): Secondary | ICD-10-CM | POA: Diagnosis not present

## 2017-04-18 DIAGNOSIS — I8510 Secondary esophageal varices without bleeding: Secondary | ICD-10-CM | POA: Diagnosis present

## 2017-04-18 DIAGNOSIS — Z79899 Other long term (current) drug therapy: Secondary | ICD-10-CM | POA: Diagnosis not present

## 2017-04-18 DIAGNOSIS — K7030 Alcoholic cirrhosis of liver without ascites: Secondary | ICD-10-CM | POA: Diagnosis not present

## 2017-04-18 DIAGNOSIS — I5042 Chronic combined systolic (congestive) and diastolic (congestive) heart failure: Secondary | ICD-10-CM | POA: Diagnosis not present

## 2017-04-18 DIAGNOSIS — R188 Other ascites: Secondary | ICD-10-CM | POA: Diagnosis present

## 2017-04-18 DIAGNOSIS — N183 Chronic kidney disease, stage 3 (moderate): Secondary | ICD-10-CM | POA: Diagnosis present

## 2017-04-18 DIAGNOSIS — K649 Unspecified hemorrhoids: Secondary | ICD-10-CM | POA: Insufficient documentation

## 2017-04-18 DIAGNOSIS — E875 Hyperkalemia: Secondary | ICD-10-CM | POA: Diagnosis present

## 2017-04-18 DIAGNOSIS — I8500 Esophageal varices without bleeding: Secondary | ICD-10-CM | POA: Diagnosis not present

## 2017-04-18 DIAGNOSIS — K922 Gastrointestinal hemorrhage, unspecified: Secondary | ICD-10-CM | POA: Diagnosis not present

## 2017-04-18 DIAGNOSIS — F329 Major depressive disorder, single episode, unspecified: Secondary | ICD-10-CM | POA: Diagnosis present

## 2017-04-18 DIAGNOSIS — K7460 Unspecified cirrhosis of liver: Secondary | ICD-10-CM | POA: Diagnosis not present

## 2017-04-18 DIAGNOSIS — K7200 Acute and subacute hepatic failure without coma: Secondary | ICD-10-CM | POA: Diagnosis not present

## 2017-04-18 DIAGNOSIS — J690 Pneumonitis due to inhalation of food and vomit: Secondary | ICD-10-CM | POA: Diagnosis not present

## 2017-04-18 DIAGNOSIS — I2510 Atherosclerotic heart disease of native coronary artery without angina pectoris: Secondary | ICD-10-CM | POA: Diagnosis present

## 2017-04-18 DIAGNOSIS — R4182 Altered mental status, unspecified: Secondary | ICD-10-CM | POA: Diagnosis not present

## 2017-04-18 DIAGNOSIS — J9600 Acute respiratory failure, unspecified whether with hypoxia or hypercapnia: Secondary | ICD-10-CM | POA: Diagnosis not present

## 2017-04-18 DIAGNOSIS — R579 Shock, unspecified: Secondary | ICD-10-CM | POA: Diagnosis not present

## 2017-04-18 DIAGNOSIS — I130 Hypertensive heart and chronic kidney disease with heart failure and stage 1 through stage 4 chronic kidney disease, or unspecified chronic kidney disease: Secondary | ICD-10-CM | POA: Diagnosis present

## 2017-04-18 DIAGNOSIS — K7469 Other cirrhosis of liver: Secondary | ICD-10-CM | POA: Diagnosis not present

## 2017-04-18 DIAGNOSIS — I129 Hypertensive chronic kidney disease with stage 1 through stage 4 chronic kidney disease, or unspecified chronic kidney disease: Secondary | ICD-10-CM | POA: Diagnosis not present

## 2017-04-18 DIAGNOSIS — D693 Immune thrombocytopenic purpura: Secondary | ICD-10-CM | POA: Diagnosis present

## 2017-04-18 DIAGNOSIS — D696 Thrombocytopenia, unspecified: Secondary | ICD-10-CM | POA: Diagnosis not present

## 2017-04-18 DIAGNOSIS — Z66 Do not resuscitate: Secondary | ICD-10-CM | POA: Diagnosis not present

## 2017-04-18 DIAGNOSIS — K7290 Hepatic failure, unspecified without coma: Secondary | ICD-10-CM | POA: Diagnosis present

## 2017-04-18 DIAGNOSIS — R0989 Other specified symptoms and signs involving the circulatory and respiratory systems: Secondary | ICD-10-CM | POA: Diagnosis not present

## 2017-04-18 DIAGNOSIS — D631 Anemia in chronic kidney disease: Secondary | ICD-10-CM | POA: Diagnosis not present

## 2017-04-18 DIAGNOSIS — D62 Acute posthemorrhagic anemia: Secondary | ICD-10-CM | POA: Diagnosis not present

## 2017-04-18 DIAGNOSIS — N179 Acute kidney failure, unspecified: Secondary | ICD-10-CM | POA: Diagnosis present

## 2017-04-18 DIAGNOSIS — K219 Gastro-esophageal reflux disease without esophagitis: Secondary | ICD-10-CM | POA: Diagnosis present

## 2017-04-18 DIAGNOSIS — K3189 Other diseases of stomach and duodenum: Secondary | ICD-10-CM | POA: Diagnosis present

## 2017-04-18 DIAGNOSIS — F331 Major depressive disorder, recurrent, moderate: Secondary | ICD-10-CM | POA: Diagnosis not present

## 2017-04-18 DIAGNOSIS — E872 Acidosis: Secondary | ICD-10-CM | POA: Diagnosis present

## 2017-04-18 DIAGNOSIS — J9811 Atelectasis: Secondary | ICD-10-CM | POA: Diagnosis not present

## 2017-04-18 DIAGNOSIS — R161 Splenomegaly, not elsewhere classified: Secondary | ICD-10-CM | POA: Diagnosis not present

## 2017-04-18 HISTORY — PX: IR PARACENTESIS: IMG2679

## 2017-04-18 LAB — BODY FLUID CELL COUNT WITH DIFFERENTIAL
EOS FL: NONE SEEN %
LYMPHS FL: 38 %
MONOCYTE-MACROPHAGE-SEROUS FLUID: 61 % (ref 50–90)
Neutrophil Count, Fluid: 1 % (ref 0–25)
Total Nucleated Cell Count, Fluid: 92 cu mm (ref 0–1000)

## 2017-04-18 LAB — ALBUMIN, PLEURAL OR PERITONEAL FLUID

## 2017-04-18 LAB — GRAM STAIN

## 2017-04-18 MED ORDER — LIDOCAINE HCL 1 % IJ SOLN
INTRAMUSCULAR | Status: DC | PRN
  Administered 2017-04-18: 10 mL

## 2017-04-18 MED ORDER — LIDOCAINE HCL 1 % IJ SOLN
INTRAMUSCULAR | Status: AC
  Filled 2017-04-18: qty 20

## 2017-04-18 MED ORDER — ALBUMIN HUMAN 25 % IV SOLN
50.0000 g | Freq: Once | INTRAVENOUS | Status: AC
  Administered 2017-04-18: 50 g via INTRAVENOUS
  Filled 2017-04-18: qty 200

## 2017-04-18 MED FILL — NUPERCAINAL 1% OINTMENT: 1 | 10 days supply | Qty: 57 | Fill #0

## 2017-04-18 NOTE — Progress Notes (Signed)
Patient presents to radiology department today for ascites. Patient was visibly uncomfortable and in pain from hemorrhoids.  He reports he has seen two doctors in the past week for hemorrhoid care and pain medicines.  He has lidocaine suppositories and creams which do help for short periods of time. He is doing sitz baths.  PA offered for patient to go to the ED after paracentesis for ongoing assessment and treatment of his hemorrhoids and pain.  Initially, patient wanted to go to the ED.  Informed patient's wife of the plan.  She expressed concern for physical exam being painful as well as transportation this afternoon if patient could not be seen immediately, however did state she agreed he was in pain and was willing to accompany patient to ED for assessment.  Informed patient who states "she's just being nice to you.  She doesn't want to help me.  I think she verbally abuses me.  She only wants to help me to a certain point.  I think she cares in her own way, but she says "you're not a baby, help yourself" when she knows I'm in pain and can't do everything by myself."  PA again offered to help patient get to the ED for ongoing assessment and stated that wife did not need to come with him if he wanted treatment. Patient states "No, I'll be ok.  I'm just tired.  I just don't want to hear about it later." Patient does state that wife will give him medications, transport him for doctor's visits, help him with self care.  He has an appointment tomorrow at [facility] with the liver transplant team psychiatrist and states his wife will take him.  Encouraged patient to request meeting with psychiatrist be done confidentially and not in the presence of his wife.  Encouraged patient to speak to medical team about home issues.   Patient states "I feel ok to go home." Discharged home with wife.   Vincent Ochoa, MMS RDN PA-C 12:05 PM

## 2017-04-18 NOTE — Procedures (Signed)
PROCEDURE SUMMARY:  Successful US guided paracentesis from right lateral abdomen.  Yielded 10.1L of clear, yellow fluid.  No immediate complications.  Pt tolerated well.   Specimen was sent for labs.  Docia Barrier PA-C 04/18/2017 11:49 AM

## 2017-04-19 DIAGNOSIS — K7290 Hepatic failure, unspecified without coma: Secondary | ICD-10-CM | POA: Diagnosis not present

## 2017-04-19 DIAGNOSIS — K766 Portal hypertension: Secondary | ICD-10-CM | POA: Diagnosis not present

## 2017-04-19 DIAGNOSIS — F331 Major depressive disorder, recurrent, moderate: Secondary | ICD-10-CM | POA: Diagnosis not present

## 2017-04-19 DIAGNOSIS — R161 Splenomegaly, not elsewhere classified: Secondary | ICD-10-CM | POA: Diagnosis not present

## 2017-04-19 DIAGNOSIS — R188 Other ascites: Secondary | ICD-10-CM | POA: Diagnosis not present

## 2017-04-19 DIAGNOSIS — F4310 Post-traumatic stress disorder, unspecified: Secondary | ICD-10-CM | POA: Diagnosis not present

## 2017-04-19 DIAGNOSIS — K7469 Other cirrhosis of liver: Secondary | ICD-10-CM | POA: Diagnosis not present

## 2017-04-19 DIAGNOSIS — K7460 Unspecified cirrhosis of liver: Secondary | ICD-10-CM | POA: Diagnosis not present

## 2017-04-20 DIAGNOSIS — I2510 Atherosclerotic heart disease of native coronary artery without angina pectoris: Secondary | ICD-10-CM | POA: Diagnosis not present

## 2017-04-20 DIAGNOSIS — I129 Hypertensive chronic kidney disease with stage 1 through stage 4 chronic kidney disease, or unspecified chronic kidney disease: Secondary | ICD-10-CM | POA: Diagnosis not present

## 2017-04-20 DIAGNOSIS — N184 Chronic kidney disease, stage 4 (severe): Secondary | ICD-10-CM | POA: Diagnosis not present

## 2017-04-20 DIAGNOSIS — R188 Other ascites: Secondary | ICD-10-CM | POA: Diagnosis not present

## 2017-04-20 DIAGNOSIS — K7469 Other cirrhosis of liver: Secondary | ICD-10-CM | POA: Diagnosis not present

## 2017-04-20 DIAGNOSIS — D696 Thrombocytopenia, unspecified: Secondary | ICD-10-CM | POA: Diagnosis not present

## 2017-04-20 DIAGNOSIS — K766 Portal hypertension: Secondary | ICD-10-CM | POA: Diagnosis not present

## 2017-04-20 DIAGNOSIS — K7290 Hepatic failure, unspecified without coma: Secondary | ICD-10-CM | POA: Diagnosis not present

## 2017-04-20 DIAGNOSIS — K652 Spontaneous bacterial peritonitis: Secondary | ICD-10-CM | POA: Diagnosis not present

## 2017-04-21 ENCOUNTER — Encounter (HOSPITAL_COMMUNITY): Payer: Self-pay

## 2017-04-21 ENCOUNTER — Emergency Department (HOSPITAL_COMMUNITY): Payer: Medicare HMO

## 2017-04-21 ENCOUNTER — Telehealth: Payer: Self-pay

## 2017-04-21 ENCOUNTER — Inpatient Hospital Stay (HOSPITAL_COMMUNITY)
Admission: EM | Admit: 2017-04-21 | Discharge: 2017-04-28 | DRG: 377 | Disposition: A | Discharge status: Other Acute Inpatient Hospital | Payer: Medicare HMO | Attending: Family Medicine | Admitting: Family Medicine

## 2017-04-21 DIAGNOSIS — N183 Chronic kidney disease, stage 3 (moderate): Secondary | ICD-10-CM | POA: Diagnosis present

## 2017-04-21 DIAGNOSIS — E440 Moderate protein-calorie malnutrition: Secondary | ICD-10-CM | POA: Diagnosis present

## 2017-04-21 DIAGNOSIS — D638 Anemia in other chronic diseases classified elsewhere: Secondary | ICD-10-CM | POA: Diagnosis present

## 2017-04-21 DIAGNOSIS — I5042 Chronic combined systolic (congestive) and diastolic (congestive) heart failure: Secondary | ICD-10-CM | POA: Diagnosis present

## 2017-04-21 DIAGNOSIS — N179 Acute kidney failure, unspecified: Secondary | ICD-10-CM | POA: Diagnosis present

## 2017-04-21 DIAGNOSIS — Z96652 Presence of left artificial knee joint: Secondary | ICD-10-CM | POA: Diagnosis present

## 2017-04-21 DIAGNOSIS — K7460 Unspecified cirrhosis of liver: Secondary | ICD-10-CM | POA: Diagnosis present

## 2017-04-21 DIAGNOSIS — E785 Hyperlipidemia, unspecified: Secondary | ICD-10-CM | POA: Diagnosis present

## 2017-04-21 DIAGNOSIS — K648 Other hemorrhoids: Secondary | ICD-10-CM | POA: Diagnosis present

## 2017-04-21 DIAGNOSIS — D693 Immune thrombocytopenic purpura: Secondary | ICD-10-CM | POA: Diagnosis present

## 2017-04-21 DIAGNOSIS — K625 Hemorrhage of anus and rectum: Secondary | ICD-10-CM | POA: Diagnosis not present

## 2017-04-21 DIAGNOSIS — K922 Gastrointestinal hemorrhage, unspecified: Secondary | ICD-10-CM | POA: Diagnosis not present

## 2017-04-21 DIAGNOSIS — Z79899 Other long term (current) drug therapy: Secondary | ICD-10-CM

## 2017-04-21 DIAGNOSIS — D62 Acute posthemorrhagic anemia: Secondary | ICD-10-CM | POA: Diagnosis present

## 2017-04-21 DIAGNOSIS — Z833 Family history of diabetes mellitus: Secondary | ICD-10-CM

## 2017-04-21 DIAGNOSIS — I8510 Secondary esophageal varices without bleeding: Secondary | ICD-10-CM | POA: Diagnosis present

## 2017-04-21 DIAGNOSIS — E875 Hyperkalemia: Secondary | ICD-10-CM | POA: Diagnosis present

## 2017-04-21 DIAGNOSIS — K219 Gastro-esophageal reflux disease without esophagitis: Secondary | ICD-10-CM | POA: Diagnosis present

## 2017-04-21 DIAGNOSIS — R188 Other ascites: Secondary | ICD-10-CM | POA: Diagnosis present

## 2017-04-21 DIAGNOSIS — K7682 Hepatic encephalopathy: Secondary | ICD-10-CM

## 2017-04-21 DIAGNOSIS — K644 Residual hemorrhoidal skin tags: Secondary | ICD-10-CM | POA: Diagnosis present

## 2017-04-21 DIAGNOSIS — K767 Hepatorenal syndrome: Secondary | ICD-10-CM | POA: Diagnosis present

## 2017-04-21 DIAGNOSIS — E872 Acidosis: Secondary | ICD-10-CM | POA: Diagnosis present

## 2017-04-21 DIAGNOSIS — F329 Major depressive disorder, single episode, unspecified: Secondary | ICD-10-CM | POA: Diagnosis present

## 2017-04-21 DIAGNOSIS — K7290 Hepatic failure, unspecified without coma: Secondary | ICD-10-CM | POA: Diagnosis present

## 2017-04-21 DIAGNOSIS — Z8249 Family history of ischemic heart disease and other diseases of the circulatory system: Secondary | ICD-10-CM

## 2017-04-21 DIAGNOSIS — K3189 Other diseases of stomach and duodenum: Secondary | ICD-10-CM | POA: Diagnosis present

## 2017-04-21 DIAGNOSIS — I2510 Atherosclerotic heart disease of native coronary artery without angina pectoris: Secondary | ICD-10-CM | POA: Diagnosis present

## 2017-04-21 DIAGNOSIS — K920 Hematemesis: Secondary | ICD-10-CM | POA: Diagnosis present

## 2017-04-21 DIAGNOSIS — I480 Paroxysmal atrial fibrillation: Secondary | ICD-10-CM | POA: Diagnosis present

## 2017-04-21 DIAGNOSIS — G4733 Obstructive sleep apnea (adult) (pediatric): Secondary | ICD-10-CM | POA: Diagnosis present

## 2017-04-21 DIAGNOSIS — K766 Portal hypertension: Secondary | ICD-10-CM | POA: Diagnosis present

## 2017-04-21 DIAGNOSIS — Z823 Family history of stroke: Secondary | ICD-10-CM

## 2017-04-21 DIAGNOSIS — Z8673 Personal history of transient ischemic attack (TIA), and cerebral infarction without residual deficits: Secondary | ICD-10-CM

## 2017-04-21 DIAGNOSIS — I130 Hypertensive heart and chronic kidney disease with heart failure and stage 1 through stage 4 chronic kidney disease, or unspecified chronic kidney disease: Secondary | ICD-10-CM | POA: Diagnosis present

## 2017-04-21 DIAGNOSIS — Z87891 Personal history of nicotine dependence: Secondary | ICD-10-CM

## 2017-04-21 DIAGNOSIS — K760 Fatty (change of) liver, not elsewhere classified: Secondary | ICD-10-CM | POA: Diagnosis present

## 2017-04-21 DIAGNOSIS — G9340 Encephalopathy, unspecified: Secondary | ICD-10-CM | POA: Diagnosis not present

## 2017-04-21 DIAGNOSIS — E860 Dehydration: Secondary | ICD-10-CM | POA: Diagnosis present

## 2017-04-21 DIAGNOSIS — I252 Old myocardial infarction: Secondary | ICD-10-CM

## 2017-04-21 LAB — COMPREHENSIVE METABOLIC PANEL
ALBUMIN: 3.5 g/dL (ref 3.5–5.0)
ALK PHOS: 85 U/L (ref 38–126)
ALT: 38 U/L (ref 17–63)
ANION GAP: 10 (ref 5–15)
AST: 62 U/L — ABNORMAL HIGH (ref 15–41)
BUN: 56 mg/dL — ABNORMAL HIGH (ref 6–20)
CALCIUM: 9.1 mg/dL (ref 8.9–10.3)
CO2: 13 mmol/L — AB (ref 22–32)
Chloride: 107 mmol/L (ref 101–111)
Creatinine, Ser: 3.72 mg/dL — ABNORMAL HIGH (ref 0.61–1.24)
GFR calc Af Amer: 19 mL/min — ABNORMAL LOW (ref >60)
GFR calc non Af Amer: 16 mL/min — ABNORMAL LOW (ref >60)
GLUCOSE: 133 mg/dL — AB (ref 65–99)
Potassium: 5.9 mmol/L — ABNORMAL HIGH (ref 3.5–5.1)
SODIUM: 130 mmol/L — AB (ref 135–145)
Total Bilirubin: 2.9 mg/dL — ABNORMAL HIGH (ref 0.3–1.2)
Total Protein: 6.6 g/dL (ref 6.5–8.1)

## 2017-04-21 LAB — CBC
HEMATOCRIT: 27.2 % — AB (ref 39.0–52.0)
HEMOGLOBIN: 9.3 g/dL — AB (ref 13.0–17.0)
MCH: 30.7 pg (ref 26.0–34.0)
MCHC: 34.2 g/dL (ref 30.0–36.0)
MCV: 89.8 fL (ref 78.0–100.0)
Platelets: 67 10*3/uL — ABNORMAL LOW (ref 150–400)
RBC: 3.03 MIL/uL — ABNORMAL LOW (ref 4.22–5.81)
RDW: 15.2 % (ref 11.5–15.5)
WBC: 8.8 10*3/uL (ref 4.0–10.5)

## 2017-04-21 LAB — APTT: aPTT: 36 seconds (ref 24–36)

## 2017-04-21 LAB — PROTIME-INR
INR: 1.51
PROTHROMBIN TIME: 18.4 s — AB (ref 11.4–15.2)

## 2017-04-21 LAB — LIPASE, BLOOD: Lipase: 105 U/L — ABNORMAL HIGH (ref 11–51)

## 2017-04-21 MED ORDER — FENTANYL CITRATE (PF) 100 MCG/2ML IJ SOLN
50.0000 ug | Freq: Once | INTRAMUSCULAR | Status: AC
  Administered 2017-04-21: 50 ug via INTRAVENOUS
  Filled 2017-04-21: qty 2

## 2017-04-21 MED ORDER — PRAMOXINE HCL 1 % RE FOAM
1.0000 "application " | Freq: Three times a day (TID) | RECTAL | Status: DC | PRN
  Administered 2017-04-24: 1 via RECTAL
  Filled 2017-04-21 (×2): qty 15

## 2017-04-21 MED ORDER — SODIUM CHLORIDE 0.9 % IV SOLN
8.0000 mg/h | INTRAVENOUS | Status: DC
  Administered 2017-04-21 – 2017-04-23 (×5): 8 mg/h via INTRAVENOUS
  Filled 2017-04-21 (×13): qty 80

## 2017-04-21 MED ORDER — MIDODRINE HCL 5 MG PO TABS: 15.0000 mg | ORAL_TABLET | Freq: Three times a day (TID) | ORAL | Status: DC

## 2017-04-21 MED ORDER — FENTANYL CITRATE (PF) 100 MCG/2ML IJ SOLN
25.0000 ug | Freq: Four times a day (QID) | INTRAMUSCULAR | Status: DC | PRN
  Administered 2017-04-22 – 2017-04-25 (×9): 25 ug via INTRAVENOUS
  Filled 2017-04-21 (×10): qty 2

## 2017-04-21 MED ORDER — OCTREOTIDE LOAD VIA INFUSION
50.0000 ug | Freq: Once | INTRAVENOUS | Status: AC
  Administered 2017-04-21: 50 ug via INTRAVENOUS
  Filled 2017-04-21: qty 25

## 2017-04-21 MED ORDER — SODIUM CHLORIDE 0.9 % IV BOLUS (SEPSIS)
1000.0000 mL | Freq: Once | INTRAVENOUS | Status: AC
  Administered 2017-04-21: 1000 mL via INTRAVENOUS

## 2017-04-21 MED ORDER — HYDROCORTISONE ACETATE 25 MG RE SUPP
25.0000 mg | Freq: Two times a day (BID) | RECTAL | Status: DC | PRN
  Administered 2017-04-23 – 2017-04-25 (×4): 25 mg via RECTAL
  Filled 2017-04-21 (×5): qty 1

## 2017-04-21 MED ORDER — SODIUM CHLORIDE 0.9% FLUSH
3.0000 mL | Freq: Two times a day (BID) | INTRAVENOUS | Status: DC
  Administered 2017-04-22 – 2017-04-27 (×10): 3 mL via INTRAVENOUS

## 2017-04-21 MED ORDER — ONDANSETRON HCL 4 MG/2ML IJ SOLN
4.0000 mg | Freq: Once | INTRAMUSCULAR | Status: AC
  Administered 2017-04-21: 4 mg via INTRAVENOUS
  Filled 2017-04-21: qty 2

## 2017-04-21 MED ORDER — MIDODRINE HCL 5 MG PO TABS: 15.0000 mg | ORAL_TABLET | Freq: Once | ORAL | Status: DC

## 2017-04-21 MED ORDER — SODIUM CHLORIDE 0.9 % IV SOLN
80.0000 mg | Freq: Once | INTRAVENOUS | Status: AC
  Administered 2017-04-21: 20:00:00 80 mg via INTRAVENOUS
  Filled 2017-04-21: qty 80

## 2017-04-21 MED ORDER — DEXTROSE 5 % IV SOLN
1.0000 g | Freq: Once | INTRAVENOUS | Status: AC
  Administered 2017-04-21: 1 g via INTRAVENOUS
  Filled 2017-04-21: qty 10

## 2017-04-21 MED ORDER — RIFAXIMIN 550 MG PO TABS: 550.0000 mg | ORAL_TABLET | Freq: Two times a day (BID) | ORAL | Status: DC

## 2017-04-21 MED ORDER — SODIUM CHLORIDE 0.9 % IV SOLN
50.0000 ug/h | INTRAVENOUS | Status: DC
  Administered 2017-04-21 – 2017-04-24 (×5): 50 ug/h via INTRAVENOUS
  Filled 2017-04-21 (×14): qty 1

## 2017-04-21 NOTE — Progress Notes (Signed)
Have been notified of pt's admission by EDP and will plan to see pt tomorrow morning.  Have tentatively scheduled pt for egd tomorrow.  PLEASE FEEL FREE TO CALL MY ANYTIME TONIGHT IF PT DESTABILIZES OR YOU HAVE QUESTIONS.  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  Cleotis Nipper, M.D. Pager [phone] If no answer or after 5 PM call [phone]

## 2017-04-21 NOTE — Telephone Encounter (Signed)
Pt is at the ED. In sever pain. Ed is being slow. Is there anything we can do to help the pt. Anything we can do would be greatly appreciated. Ottis Stain, CMA

## 2017-04-21 NOTE — ED Provider Notes (Signed)
[place] DEPT Provider Note   CSN: [id number] Arrival date & time: 04/21/17  1635     History   Chief Complaint Chief Complaint  Patient presents with  . Abdominal Pain  . Hematemesis    HPI Vincent Ochoa is a 61 y.o. male.  61 yo M with a significant past medical history of cirrhosis of unknown etiology and varices comes in with a chief complaint of hematemesis and abdominal pain. Going on for the past couple hours. Patient has had a large number of movements of all been bright red. He also is describing bright red vomitus 2. Is now clear. Right upper quadrant sharp abdominal pain with mild radiation to the back. Denies fevers chills. Prior cholecystectomy. Is currently being seen at [facility] for possible transplant.   The history is provided by the patient.  Abdominal Pain   This is a recurrent problem. The current episode started 1 to 2 hours ago. The problem occurs constantly. The problem has not changed since onset.The pain is associated with an unknown factor. The pain is located in the RUQ. The quality of the pain is sharp and shooting. The pain is at a severity of 10/10. The pain is severe. Associated symptoms include diarrhea, nausea and vomiting. Pertinent negatives include fever, headaches, arthralgias and myalgias.    Past Medical History:  Diagnosis Date  . Arthritis    "hands, knees, back" (07/05/2016)  . Back pain, lumbosacral 2014  . CHF (congestive heart failure) ([place])   . Cirrhosis ([place])   . Coronary artery disease    a. Evaluated by Dr. Stanford Breed 2012 - nonobstructive 2007-2008 by cath. b. Abnormal stress test 10/2014 but cath deferred temporarily due to thrombocytopenia. c. 10/2014: readm with AF-RVR/NSTEMI - s/p BMS to mLCx 11/04/14 (mod dz in RCA);  d. relook cath 01/2015 and 06/02/2015 patent stent, nonobs CAD, EF 55-65%.  . Elevated LFTs   . Facial cellulitis 2014   Periorbital  . Fatty liver US - 2012  . Frequent headaches   . GERD (gastroesophageal  reflux disease)   . History of blood transfusion 01/2016   "lost alot of blood vomiting"  . Hyperlipidemia   . Morbid obesity ([place])   . Obesity   . OSA on CPAP    severe with AHI 56/hr with successful CPAP titration to 18cm H2O  . PAF (paroxysmal atrial fibrillation) ([place])    a. Episode 10/2014 at time of NSTEMI, spont converted to NSR. Observation for further episodes (not on anticoag at present time due to [facility] = 1, thrombocytopenia).  . Psoriasis   . Sinus bradycardia   . Stroke [facility])    "I've been told I'd had a mini stroke on my left side" (07/05/2016)  . Thrombocytopenia ([place])    a. Onset unclear, but noted 2012. ? 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 s/p prednisone, IVIG.    Patient Active Problem List   Diagnosis Date Noted  . Grade II hemorrhoids 04/12/2017  . Anemia due to chronic illness 12/01/2016  . Hematuria, microscopic 10/18/2016  . Rectal bleeding 09/26/2016  . Nephrolithiasis 09/15/2016  . Chronic ITP (idiopathic thrombocytopenia) ([facility]) 09/15/2016  . Varices, esophageal ([place]) 09/15/2016  . Essential hypertension   . Hematemesis 06/21/2016  . Chronic combined systolic and diastolic congestive heart failure ([place])   . Erectile dysfunction 06/03/2016  . Cirrhosis of liver with ascites ([place]) 02/25/2016  . Portal vein thrombosis   . Abdominal distention   . Acrochordon 07/18/2015  .  PAF (paroxysmal atrial fibrillation) ([place])   . Obese   . Depression 01/08/2015  . OSA (obstructive sleep apnea)   . Back pain, lumbosacral 01/04/2013  . Thrombocytopenia ([place]) 11/29/2012  . GERD (gastroesophageal reflux disease) 08/15/2011  . Coronary artery disease involving native coronary artery of native heart without angina pectoris 07/29/2011  . Psoriasis 07/29/2011    Past Surgical History:  Procedure Laterality Date  . CARDIAC CATHETERIZATION    . CARDIAC CATHETERIZATION N/A 06/02/2015   Procedure: Left Heart Cath and Coronary  Angiography;  Surgeon: Jettie Booze, MD;  Location: [place] CV LAB;  Service: Cardiovascular;  Laterality: N/A;  . COLONOSCOPY WITH PROPOFOL N/A 09/26/2016   Procedure: COLONOSCOPY WITH PROPOFOL;  Surgeon: Wilford Corner, MD;  Location: [facility] ENDOSCOPY;  Service: Endoscopy;  Laterality: N/A;  . ESOPHAGOGASTRODUODENOSCOPY N/A 06/22/2016   Procedure: ESOPHAGOGASTRODUODENOSCOPY (EGD);  Surgeon: Clarene Essex, MD;  Location: [facility] ENDOSCOPY;  Service: Endoscopy;  Laterality: N/A;  . ESOPHAGOGASTRODUODENOSCOPY N/A 11/24/2016   Procedure: ESOPHAGOGASTRODUODENOSCOPY (EGD);  Surgeon: Clarene Essex, MD;  Location: [facility] ENDOSCOPY;  Service: Endoscopy;  Laterality: N/A;  . ESOPHAGOGASTRODUODENOSCOPY (EGD) WITH PROPOFOL N/A 08/17/2016   Procedure: ESOPHAGOGASTRODUODENOSCOPY (EGD) WITH PROPOFOL;  Surgeon: Otis Brace, MD;  Location: [place];  Service: Gastroenterology;  Laterality: N/A;  . FACIAL COSMETIC SURGERY  1990s   "rodeo-related injury"  . IR GENERIC HISTORICAL  09/14/2016   IR RADIOLOGIST EVAL & MGMT 09/14/2016 Sandi Mariscal, MD [facility] INTERV RAD  . IR GENERIC HISTORICAL  12/15/2016   IR RADIOLOGIST EVAL & MGMT 12/15/2016 Sandi Mariscal, MD [facility] INTERV RAD  . IR GENERIC HISTORICAL  02/09/2017   IR PARACENTESIS 02/09/2017 Saverio Danker, PA-C [facility]INTER[facility]  . IR PARACENTESIS  03/08/2017  . IR PARACENTESIS  03/14/2017  . IR PARACENTESIS  03/21/2017  . IR PARACENTESIS  03/24/2017  . IR PARACENTESIS  03/30/2017  . IR PARACENTESIS  03/31/2017  . IR PARACENTESIS  04/11/2017  . IR PARACENTESIS  04/18/2017  . KNEE ARTHROPLASTY Left 1970's   "put cartilage in it"  . LAPAROSCOPIC CHOLECYSTECTOMY  2007  . LEFT HEART CATHETERIZATION WITH CORONARY ANGIOGRAM N/A 11/04/2014   Procedure: LEFT HEART CATHETERIZATION WITH CORONARY ANGIOGRAM;  Surgeon: Jettie Booze, MD;  Location: [facility] CATH LAB;  Service: Cardiovascular;  Laterality: N/A;  . LEFT HEART CATHETERIZATION WITH CORONARY ANGIOGRAM N/A 01/28/2015   Procedure:  LEFT HEART CATHETERIZATION WITH CORONARY ANGIOGRAM;  Surgeon: Sherren Mocha, MD;  Location: [facility] CATH LAB;  Service: Cardiovascular;  Laterality: N/A;  . liver cirrhosis    . RADIOLOGY WITH ANESTHESIA N/A 02/04/2016   Procedure: RADIOLOGY WITH ANESTHESIA;  Surgeon: Medication Radiologist, MD;  Location: [place];  Service: Radiology;  Laterality: N/A;  . TONSILLECTOMY  1960's       Home Medications    Prior to Admission medications   Medication Sig Start Date End Date Taking? Authorizing Provider  buPROPion (WELLBUTRIN XL) 150 MG 24 hr tablet Take 1 tablet (150 mg total) by mouth every other day. Patient taking differently: Take 150 mg by mouth daily.  12/14/16   Bufford Lope, DO  ciprofloxacin (CIPRO) 500 MG tablet Take 500 mg by mouth daily. 03/04/17   [provider]  dibucaine (NUPERCAINAL) 1 % ointment Apply topically 3 (three) times daily as needed for pain. 04/14/17   Janora Norlander, DO  docusate sodium (COLACE) 100 MG capsule Take 1 capsule (100 mg total) by mouth 2 (two) times daily. 04/12/17   Bufford Lope, DO  hydrocortisone (ANUSOL-HC) 25 MG suppository  Place 25 mg rectally 2 (two) times daily as needed. 04/07/17 05/07/17  [provider]  Lactulose 20 GM/30ML SOLN Take 45 mLs (30 g total) by mouth 2 (two) times daily. 01/06/17   Bufford Lope, DO  midodrine (PROAMATINE) 5 MG tablet Take 15 mg by mouth 3 (three) times daily. 04/07/17 05/07/17  [provider]  Multiple Vitamins-Minerals (MULTIVITAMIN GUMMIES ADULT) CHEW Chew 1 tablet by mouth daily. 12/01/16   Heath Lark, MD  octreotide (SANDOSTATIN) 500 MCG/ML SOLN injection Inject 200 mcg into the skin 3 (three) times daily. 04/10/17 05/10/17  [provider]  pantoprazole (PROTONIX) 40 MG tablet Take 1 tablet (40 mg total) by mouth 2 (two) times daily. 12/14/16   Bufford Lope, DO  pramoxine (PROCTOFOAM) 1 % foam Place 1 application rectally 3 (three) times daily as needed for itching. 04/12/17   Bufford Lope, DO  rifaximin (XIFAXAN) 550 MG TABS tablet Take 550 mg by mouth 2 (two) times daily. 04/10/17   [provider]  shark liver oil-cocoa butter (PREPARATION H) 0.25-3-85.5 % suppository Place 1 suppository rectally as needed for hemorrhoids.    [provider]  sucralfate (CARAFATE) 1 g tablet Take 1 g by mouth 3 (three) times daily as needed (stomach irritation).    [provider]    Family History Family History  Problem Relation Age of Onset  . Cirrhosis Mother   . Heart attack Mother   . Hypertension Mother   . Hypertension Father   . Stroke Father   . Arthritis Maternal Grandmother   . Heart disease Sister   . Diabetes Sister   . Ovarian cancer Sister   . Heart disease Other        Maternal/paternal grandparents  . Heart attack Maternal Aunt     Social History Social History  Substance Use Topics  . Smoking status: Former Smoker    Packs/day: 1.50    Years: 43.00    Types: Cigarettes    Quit date: 02/19/2016  . Smokeless tobacco: Former Systems developer  . Alcohol use 0.0 oz/week     Comment: 07/05/2016 "mostly stopped in 1979; might have a beer q 6 months or so"     Allergies   Asa [aspirin]; Eliquis [apixaban]; Nsaids; Plavix [clopidogrel]; Statins; and Tolmetin   Review of Systems Review of Systems  Constitutional: Negative for chills and fever.  HENT: Negative for congestion and facial swelling.   Eyes: Negative for discharge and visual disturbance.  Respiratory: Negative for shortness of breath.   Cardiovascular: Negative for chest pain and palpitations.  Gastrointestinal: Positive for abdominal distention, abdominal pain, blood in stool, diarrhea, nausea and vomiting.  Musculoskeletal: Negative for arthralgias and myalgias.  Skin: Negative for color change and rash.  Neurological: Negative for tremors, syncope and headaches.  Psychiatric/Behavioral: Negative for confusion and dysphoric mood.     Physical Exam Updated Vital  Signs BP 110/73   Pulse 89   Temp 98.4 F (36.9 C) (Oral)   Resp 17   SpO2 100%   Physical Exam  Constitutional: He is oriented to person, place, and time. He appears well-developed and well-nourished.  HENT:  Head: Normocephalic and atraumatic.  Eyes: EOM are normal. Pupils are equal, round, and reactive to light.  Neck: Normal range of motion. Neck supple. No JVD present.  Cardiovascular: Normal rate and regular rhythm.  Exam reveals no gallop and no friction rub.   No murmur heard. Pulmonary/Chest: No respiratory distress. He has no wheezes.  Abdominal: He exhibits distension (diffuse with fluid wave). There is tenderness. There is no rebound and no guarding. A hernia (umbilical easily reduced) is present.  Large hemorrhoid at the 7:00 position. Not actively bleeding. No gross blood on rectal exam. No melena. Soft brown.  Musculoskeletal: Normal range of motion.  Neurological: He is alert and oriented to person, place, and time.  Skin: No rash noted. No pallor.  Psychiatric: He has a normal mood and affect. His behavior is normal.  Nursing note and vitals reviewed.    ED Treatments / Results  Labs (all labs ordered are listed, but only abnormal results are displayed) Labs Reviewed  LIPASE, BLOOD - Abnormal; Notable for the following:       Result Value   Lipase 105 (*)    All other components within normal limits  COMPREHENSIVE METABOLIC PANEL - Abnormal; Notable for the following:    Sodium 130 (*)    Potassium 5.9 (*)    CO2 13 (*)    Glucose, Bld 133 (*)    BUN 56 (*)    Creatinine, Ser 3.72 (*)    AST 62 (*)    Total Bilirubin 2.9 (*)    GFR calc non Af Amer 16 (*)    GFR calc Af Amer 19 (*)    All other components within normal limits  CBC - Abnormal; Notable for the following:    RBC 3.03 (*)    Hemoglobin 9.3 (*)    HCT 27.2 (*)    Platelets 67 (*)    All other components within normal limits  PROTIME-INR - Abnormal; Notable for the following:     Prothrombin Time 18.4 (*)    All other components within normal limits  APTT  URINALYSIS, ROUTINE W REFLEX MICROSCOPIC  OCCULT BLOOD X 1 CARD TO LAB, STOOL    EKG  EKG Interpretation None       Radiology Ct Abdomen Pelvis Wo Contrast  Result Date: 04/21/2017 CLINICAL DATA:  Acute onset right lower quadrant abdominal pain, rectal bleeding and hematemesis. Initial encounter. EXAM: CT ABDOMEN AND PELVIS WITHOUT CONTRAST TECHNIQUE: Multidetector CT imaging of the abdomen and pelvis was performed following the standard protocol without IV contrast. COMPARISON:  CT of the abdomen and pelvis performed 03/29/2017 FINDINGS: Lower chest: Diffuse coronary artery calcifications are seen. Mild bibasilar atelectasis is noted. Hepatobiliary: There is a diffusely nodular contour to the liver, compatible with hepatic cirrhosis. The patient is status post cholecystectomy, with clips noted at the gallbladder fossa. The common bile duct is grossly unremarkable in appearance. Pancreas: The pancreas is within normal limits. Spleen: The spleen is enlarged, measuring 15.8 cm in length. Scattered splenic and gastric varices are seen. Adrenals/Urinary Tract: The adrenal glands are difficult to fully assess but appear grossly unremarkable. Nonobstructing right renal stones measure up to 7 mm in size. The kidneys are otherwise unremarkable. There is no hydronephrosis. No obstructing ureteral stones are identified. No perinephric stranding is seen. Stomach/Bowel: The stomach is unremarkable in appearance. The small bowel is within normal limits. The appendix is not visualized; there is no evidence for appendicitis. The colon is unremarkable in appearance. Vascular/Lymphatic: Scattered calcification is seen along the abdominal aorta and its branches. The abdominal aorta is otherwise grossly unremarkable. The inferior vena cava is grossly unremarkable. No retroperitoneal lymphadenopathy is seen. No pelvic sidewall lymphadenopathy  is identified. Reproductive: The bladder is mildly distended and grossly unremarkable. The prostate remains normal in size. Other: Large volume ascites is noted throughout the  abdomen and pelvis. Fluid extends into a tiny right inguinal hernia. Musculoskeletal: No acute osseous abnormalities are identified. Mild facet disease is noted at the lower lumbar spine. The visualized musculature is unremarkable in appearance. IMPRESSION: 1. Large volume ascites noted throughout the abdomen and pelvis. Fluid extends into a tiny right inguinal hernia. 2. Findings of hepatic cirrhosis. Scattered splenic and gastric varices noted. 3. Splenomegaly. 4. Scattered aortic atherosclerosis. 5. Diffuse coronary artery calcification noted. 6. Nonobstructing right renal stones measure up to 7 mm size. Electronically Signed   By: Garald Balding M.D.   On: 04/21/2017 21:33    Procedures Procedures (including critical care time)  Medications Ordered in ED Medications  octreotide (SANDOSTATIN) 2 mcg/mL load via infusion 50 mcg (50 mcg Intravenous Bolus from Bag 04/21/17 1953)    And  octreotide (SANDOSTATIN) 500 mcg in sodium chloride 0.9 % 250 mL (2 mcg/mL) infusion (50 mcg/hr Intravenous New Bag/Given 04/21/17 1953)  pantoprazole (PROTONIX) 80 mg in sodium chloride 0.9 % 250 mL (0.32 mg/mL) infusion (8 mg/hr Intravenous New Bag/Given 04/21/17 2100)  sodium chloride 0.9 % bolus 1,000 mL (0 mLs Intravenous Stopped 04/21/17 2111)  pantoprazole (PROTONIX) 80 mg in sodium chloride 0.9 % 100 mL IVPB (0 mg Intravenous Stopped 04/21/17 2027)  ondansetron (ZOFRAN) injection 4 mg (4 mg Intravenous Given 04/21/17 1904)  cefTRIAXone (ROCEPHIN) 1 g in dextrose 5 % 50 mL IVPB (0 g Intravenous Stopped 04/21/17 1944)  fentaNYL (SUBLIMAZE) injection 50 mcg (50 mcg Intravenous Given 04/21/17 1928)  fentaNYL (SUBLIMAZE) injection 50 mcg (50 mcg Intravenous Given 04/21/17 2110)     Initial Impression / Assessment and Plan / ED Course  I have reviewed the  triage vital signs and the nursing notes.  Pertinent labs & imaging results that were available during my care of the patient were reviewed by me and considered in my medical decision making (see chart for details).     61 yo M With a chief complaint of diffuse abdominal pain worse in the right upper quadrant. History of liver failure and ascites. Has been having hematemesis as well as bright red blood per rectum. Patient showed me a picture on his phone that looks like a significant amount of output. Had a bowel movement while in the waiting room. With a history of varices started on octreotide give IV fluids give Rocephin and started on a Protonix drip. Will discuss with GI. Discussed the case with Dr. Cristina Gong, recommended admission with nothing by mouth at midnight for likely scope in the morning.  CRITICAL CARE Performed by: Cecilio Asper   Total critical care time: 35 minutes  Critical care time was exclusive of separately billable procedures and treating other patients.  Critical care was necessary to treat or prevent imminent or life-threatening deterioration.  Critical care was time spent personally by me on the following activities: development of treatment plan with patient and/or surrogate as well as nursing, discussions with consultants, evaluation of patient's response to treatment, examination of patient, obtaining history from patient or surrogate, ordering and performing treatments and interventions, ordering and review of laboratory studies, ordering and review of radiographic studies, pulse oximetry and re-evaluation of patient's condition.  The patients results and plan were reviewed and discussed.   Any x-rays performed were independently reviewed by myself.   Differential diagnosis were considered with the presenting HPI.  Medications  octreotide (SANDOSTATIN) 2 mcg/mL load via infusion 50 mcg (50 mcg Intravenous Bolus from Bag 04/21/17 1953)    And  octreotide  (  SANDOSTATIN) 500 mcg in sodium chloride 0.9 % 250 mL (2 mcg/mL) infusion (50 mcg/hr Intravenous New Bag/Given 04/21/17 1953)  pantoprazole (PROTONIX) 80 mg in sodium chloride 0.9 % 250 mL (0.32 mg/mL) infusion (8 mg/hr Intravenous New Bag/Given 04/21/17 2100)  sodium chloride 0.9 % bolus 1,000 mL (0 mLs Intravenous Stopped 04/21/17 2111)  pantoprazole (PROTONIX) 80 mg in sodium chloride 0.9 % 100 mL IVPB (0 mg Intravenous Stopped 04/21/17 2027)  ondansetron (ZOFRAN) injection 4 mg (4 mg Intravenous Given 04/21/17 1904)  cefTRIAXone (ROCEPHIN) 1 g in dextrose 5 % 50 mL IVPB (0 g Intravenous Stopped 04/21/17 1944)  fentaNYL (SUBLIMAZE) injection 50 mcg (50 mcg Intravenous Given 04/21/17 1928)  fentaNYL (SUBLIMAZE) injection 50 mcg (50 mcg Intravenous Given 04/21/17 2110)    Vitals:   04/21/17 2100 04/21/17 2130 04/21/17 2200 04/21/17 2230  BP: 95/69 113/62 102/63 110/73  Pulse: 88 91 86 89  Resp: (!) 21 14 17 17   Temp:      TempSrc:      SpO2: 100% 93% 100% 100%    Final diagnoses:  Acute upper GI bleed    Admission/ observation were discussed with the admitting physician, patient and/or family and they are comfortable with the plan.   Final Clinical Impressions(s) / ED Diagnoses   Final diagnoses:  Acute upper GI bleed    New Prescriptions New Prescriptions   No medications on file     Deno Etienne, DO 04/21/17 2317

## 2017-04-21 NOTE — ED Notes (Signed)
Pt requesting an update, Dr. Tyrone Nine notified

## 2017-04-21 NOTE — ED Triage Notes (Signed)
Pt complaining of hx liver cancer. Pt states vomiting blood x 3. Pt states onset today. Pt complaining of R upper abdominal pain. Pt states kidneys shutting down also. Pt states takes zofran at home, no relief.

## 2017-04-21 NOTE — ED Notes (Signed)
Pt made aware that a room is being cleaned and staff should be coming to take him back to a room soon.

## 2017-04-21 NOTE — Addendum Note (Signed)
Addendum  created 04/21/17 0830 by Rica Koyanagi, MD   Sign clinical note

## 2017-04-21 NOTE — H&P (Signed)
[facility] Admission History and Physical Service Pager: [phone]  Patient name: Vincent Ochoa Medical record number: [id number] Date of birth: [birth year] Age: 61 y.o. Gender: male  Primary Care Provider: Bufford Lope, DO Consultants: GI Code Status: Full  Chief Complaint: vomiting blood  Assessment and Plan: Vincent Ochoa is a 61 y.o. male presenting with bright red vomitus and abdominal pain. PMH is significant for cirrhosis of liver with recurrent ascites (followed by [facility] hepat[facility]), CAD (wife reports stent), psoriasis, thrombocytopenia (followed by Heme), OSA, knee pain, obesity, GERD, Depression, PAF, HTN, esophageal varices, anemia of chronic disease  Hematemesis. Began this afternoon. Vomitus now clear. Hgb stable at 9.3 (BL ~ 9.0). Slightly hypotensive on admission but improved to 100/60s with 1 L fluid bolus. Concerning due to history of variceal bleeds in setting of end-stage cirrhosis with HRS. 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  - Admit to SDU, attending Dr. Mingo Amber - GI consulted in ED; Dr. Cristina Gong likely to perform EGD tomorrow though suspects portal gastropathy as cause - Monitor on telemetry in case of severe hypotension (needed albumin last admission) - Continue IV ceftriaxone and octreotide for infection prophylaxis until active bleed rule-out by EGD; then switch back to home PO cipro and IM octreotide - IV protonix - 2 large bore IVs placed - IV phenergan 12.5 mg q6h  Abdominal pain. Chronic recurrent ascites 2/2 liver failure. CT abdomen pelvis without evidence of bowel perforation. Large volume ascites noted. No leukocytosis or fever. Lipase elevated at 105 but in setting of chronic liver disease and worsening kidney function. No growth to date from peritoneal fluid culture and negative gram stain from 04/18/17.  - Monitor pain - IV fentanyl 25 mg q6h prn - Having weekly paracenteses;  consider early treatment   AKI. SCr elevated to 3.72 from new BL ~ 2.0. Suspect this indicates worsening of hepatorenal syndrome. Dehydration in setting of vomiting and nausea may also be contributing. - Ordered NS 75 mL/hr x 12 hours - CMP in a.m. - Consider Renal consult  Hyperkalemia. K 5.9 on admission. Denies chest pain or palpitations. In setting of renal failure and liver failure that could be causing relative adrenal insufficiency.  - EKG ordered - Ordered kayexalate  Liver Cirrhosis. MELD score 31 (52.6% mortality risk in next 3 months). Followed by [facility] and planning for possible liver transplant. - Will contact [facility] in a.m. with FYI of admission - Restart daily cipro for SBP prophylaxis once ceftriaxone discontinued - Restart home lactulose 30 g BID when no longer NPO - Patient reports was started on rifaximin after last hospitalization but has not been taking due to coverage issues; restart once NPO - Restart midodrine 15 mg TID after EGD  Hemorrhoids. Chronic, worsened by recent colonoscopy, per patient - Monitor for signs of thrombosis given degree of pain - Proctofoam and anusol suppository ordered prn for comfort  GERD:Stable. At home on Protonix 40 mg po BID and sucralfate 1g PO TID.  - Restart home regimen after EGD, pending GI recs  JAS:NKNLZJ. Echo 11/23/2016 with EF 50-55%. No signs of edema or crackles. Lasix and spiro discontinued at last admission to [facility] 4/10-4/14.  - Monitor for signs of fluid overload on exam  CAD: S/p Left heart cath July 2016 showing nonobstructive coronary artery disease. Mid cx lesion previously treated with a bare metal stent in 10/2014. Follows with Dr. Burt Knack outpatient.   Thrombocytopenia:Stable. On admission, plt count of 67. At prior  admission on 12/21, plt count of 47. At home on Promacta 100 mg po daily.  - Continue home regimen once eating - Monitor  DJM:EQASTM. To be using CPAP but awaiting  equipment.   - Reorder CPAP once vomiting resolved  Depression: Stable. At home on Wellbutrin.  - Order wellbutrin 150 mg po daily once taking PO  PAF: On problem list. Not on pharmacologic treatment currently given history of bleeds.   FEN/GI: heart healthy/carb mod  Prophylaxis: SCDs  Disposition: Pending GI workup  History of Present Illness:  Vincent Ochoa is a 61 y.o. male presenting with hematemesis x 2 since this afternoon. He reports bright red blood. No more episodes in ED. He says he has felt tired and has had worsening pain from hemorrhoids and BRB with BMs since having colonoscopy on day of discharge from [facility]th. He had multiple polyps removed from descending and sigmoid colon, as well as rectum. External and internal hemorrhoids were also noted. Vomitus with blood, however, is new today. He denies fevers but is always cold. He has not eaten today but has had an okay appetite the preceding days. He has had nausea not resolved by phenergan, which normally works better for him than zofran. Patient also reports feeling somewhat short of breath.  His wife says overall he had a number of good days since being discharged from [facility], but his hemorrhoids have been very uncomfortable -- he was seen twice at [facility] for this and has tried 2 creams. He had his weekly paracentesis 04/18/17 and 10.1L removed. His belly feels tight now but still not as big as it usually gets.  He has completed most of his appointments to be considered for liver transplant, per his wife.   Patient has not been missing doses of medication, except for lactulose, which he will skip and take late on days of doctor's appointments.  No NSAIDs or alcohol.   Review Of Systems: Per HPI with the following additions:   Review of Systems  Constitutional: Positive for chills. Negative for fever.  HENT: Negative for congestion and nosebleeds.   Respiratory: Positive for shortness of breath. Negative for cough.    Cardiovascular: Negative for chest pain and leg swelling.  Gastrointestinal: Positive for abdominal pain, blood in stool, nausea and vomiting.  Genitourinary: Negative for dysuria and frequency.  Musculoskeletal: Positive for falls.  Skin: Negative for rash.  Neurological: Positive for weakness. Negative for tremors and focal weakness.  Psychiatric/Behavioral: Positive for depression. Negative for suicidal ideas.    Patient Active Problem List   Diagnosis Date Noted  . Grade II hemorrhoids 04/12/2017  . Anemia due to chronic illness 12/01/2016  . Hematuria, microscopic 10/18/2016  . Rectal bleeding 09/26/2016  . Nephrolithiasis 09/15/2016  . Chronic ITP (idiopathic thrombocytopenia) (HCC) 09/15/2016  . Varices, esophageal ([place]) 09/15/2016  . Essential hypertension   . Hematemesis 06/21/2016  . Chronic combined systolic and diastolic congestive heart failure ([place])   . Erectile dysfunction 06/03/2016  . Cirrhosis of liver with ascites ([place]) 02/25/2016  . Portal vein thrombosis   . Abdominal distention   . Acrochordon 07/18/2015  . PAF (paroxysmal atrial fibrillation) ([place])   . Obese   . Depression 01/08/2015  . OSA (obstructive sleep apnea)   . Back pain, lumbosacral 01/04/2013  . Thrombocytopenia ([place]) 11/29/2012  . GERD (gastroesophageal reflux disease) 08/15/2011  . Coronary artery disease involving native coronary artery of native heart without angina pectoris 07/29/2011  . Psoriasis 07/29/2011    Past  Medical History: Past Medical History:  Diagnosis Date  . Arthritis    "hands, knees, back" (07/05/2016)  . Back pain, lumbosacral 2014  . CHF (congestive heart failure) ([place])   . Cirrhosis ([place])   . Coronary artery disease    a. Evaluated by Dr. Stanford Breed 2012 - nonobstructive 2007-2008 by cath. b. Abnormal stress test 10/2014 but cath deferred temporarily due to thrombocytopenia. c. 10/2014: readm with AF-RVR/NSTEMI - s/p BMS to mLCx 11/04/14 (mod dz in RCA);  d.  relook cath 01/2015 and 06/02/2015 patent stent, nonobs CAD, EF 55-65%.  . Elevated LFTs   . Facial cellulitis 2014   Periorbital  . Fatty liver US - 2012  . Frequent headaches   . GERD (gastroesophageal reflux disease)   . History of blood transfusion 01/2016   "lost alot of blood vomiting"  . Hyperlipidemia   . Morbid obesity ([place])   . Obesity   . OSA on CPAP    severe with AHI 56/hr with successful CPAP titration to 18cm H2O  . PAF (paroxysmal atrial fibrillation) ([place])    a. Episode 10/2014 at time of NSTEMI, spont converted to NSR. Observation for further episodes (not on anticoag at present time due to [facility] = 1, thrombocytopenia).  . Psoriasis   . Sinus bradycardia   . Stroke [facility])    "I've been told I'd had a mini stroke on my left side" (07/05/2016)  . Thrombocytopenia ([place])    a. Onset unclear, but noted 2012. ? 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 s/p prednisone, IVIG.    Past Surgical History: Past Surgical History:  Procedure Laterality Date  . CARDIAC CATHETERIZATION    . CARDIAC CATHETERIZATION N/A 06/02/2015   Procedure: Left Heart Cath and Coronary Angiography;  Surgeon: Jettie Booze, MD;  Location: [facility] CV LAB;  Service: Cardiovascular;  Laterality: N/A;  . COLONOSCOPY WITH PROPOFOL N/A 09/26/2016   Procedure: COLONOSCOPY WITH PROPOFOL;  Surgeon: Wilford Corner, MD;  Location: [facility] ENDOSCOPY;  Service: Endoscopy;  Laterality: N/A;  . ESOPHAGOGASTRODUODENOSCOPY N/A 06/22/2016   Procedure: ESOPHAGOGASTRODUODENOSCOPY (EGD);  Surgeon: Clarene Essex, MD;  Location: [facility] ENDOSCOPY;  Service: Endoscopy;  Laterality: N/A;  . ESOPHAGOGASTRODUODENOSCOPY N/A 11/24/2016   Procedure: ESOPHAGOGASTRODUODENOSCOPY (EGD);  Surgeon: Clarene Essex, MD;  Location: [facility] ENDOSCOPY;  Service: Endoscopy;  Laterality: N/A;  . ESOPHAGOGASTRODUODENOSCOPY (EGD) WITH PROPOFOL N/A 08/17/2016   Procedure: ESOPHAGOGASTRODUODENOSCOPY (EGD) WITH PROPOFOL;   Surgeon: Otis Brace, MD;  Location: [facility];  Service: Gastroenterology;  Laterality: N/A;  . FACIAL COSMETIC SURGERY  1990s   "rodeo-related injury"  . IR GENERIC HISTORICAL  09/14/2016   IR RADIOLOGIST EVAL & MGMT 09/14/2016 Sandi Mariscal, MD [facility] INTERV RAD  . IR GENERIC HISTORICAL  12/15/2016   IR RADIOLOGIST EVAL & MGMT 12/15/2016 Sandi Mariscal, MD [facility] INTERV RAD  . IR GENERIC HISTORICAL  02/09/2017   IR PARACENTESIS 02/09/2017 Saverio Danker, PA-C [facility]  . IR PARACENTESIS  03/08/2017  . IR PARACENTESIS  03/14/2017  . IR PARACENTESIS  03/21/2017  . IR PARACENTESIS  03/24/2017  . IR PARACENTESIS  03/30/2017  . IR PARACENTESIS  03/31/2017  . IR PARACENTESIS  04/11/2017  . IR PARACENTESIS  04/18/2017  . KNEE ARTHROPLASTY Left 1970's   "put cartilage in it"  . LAPAROSCOPIC CHOLECYSTECTOMY  2007  . LEFT HEART CATHETERIZATION WITH CORONARY ANGIOGRAM N/A 11/04/2014   Procedure: LEFT HEART CATHETERIZATION WITH CORONARY ANGIOGRAM;  Surgeon: Jettie Booze, MD;  Location: [facility] CATH LAB;  Service: Cardiovascular;  Laterality: N/A;  . LEFT HEART CATHETERIZATION WITH CORONARY ANGIOGRAM N/A 01/28/2015   Procedure: LEFT HEART CATHETERIZATION WITH CORONARY ANGIOGRAM;  Surgeon: Sherren Mocha, MD;  Location: [facility] CATH LAB;  Service: Cardiovascular;  Laterality: N/A;  . liver cirrhosis    . RADIOLOGY WITH ANESTHESIA N/A 02/04/2016   Procedure: RADIOLOGY WITH ANESTHESIA;  Surgeon: Medication Radiologist, MD;  Location: [facility];  Service: Radiology;  Laterality: N/A;  . TONSILLECTOMY  1960's    Social History: Social History  Substance Use Topics  . Smoking status: Former Smoker    Packs/day: 1.50    Years: 43.00    Types: Cigarettes    Quit date: 02/19/2016  . Smokeless tobacco: Former Systems developer  . Alcohol use 0.0 oz/week     Comment: 07/05/2016 "mostly stopped in 1979; might have a beer q 6 months or so"   Additional social history: Lives with wife and her two brothers.  Please also refer to  relevant sections of EMR.  Family History: Family History  Problem Relation Age of Onset  . Cirrhosis Mother   . Heart attack Mother   . Hypertension Mother   . Hypertension Father   . Stroke Father   . Arthritis Maternal Grandmother   . Heart disease Sister   . Diabetes Sister   . Ovarian cancer Sister   . Heart disease Other        Maternal/paternal grandparents  . Heart attack Maternal Aunt    Allergies and Medications: Allergies  Allergen Reactions  . Asa [Aspirin] Other (See Comments)    Other reaction(s): Other (See Comments) Previous hemmorhage Bleeding Previous hemmorhage  . Eliquis [Apixaban] Other (See Comments)    bleeding Potentiality of internal bleeding  . Nsaids Other (See Comments) and Anaphylaxis    May cause bleeding issues (per wife) May only have 1 plain Tylenol every 8 hours is needed for flu symptoms, per doctor.  . Plavix [Clopidogrel] Other (See Comments)    Bleeding Potentiality of internal bleeding  . Statins Other (See Comments)    Other reaction(s): Other (See Comments) Potentially may make patient bleed internally (per wife) Potentially may make patient bleed internally (per wife)  . Tolmetin     Other reaction(s): Other (See Comments) May cause bleeding issues (per wife) May only have 1 plain Tylenol every 8 hours is needed for flu symptoms, per doctor.   No current facility-administered medications on file prior to encounter.    Current Outpatient Prescriptions on File Prior to Encounter  Medication Sig Dispense Refill  . buPROPion (WELLBUTRIN XL) 150 MG 24 hr tablet Take 1 tablet (150 mg total) by mouth every other day. (Patient taking differently: Take 150 mg by mouth daily. ) 30 tablet 2  . ciprofloxacin (CIPRO) 500 MG tablet Take 500 mg by mouth daily.    . dibucaine (NUPERCAINAL) 1 % ointment Apply topically 3 (three) times daily as needed for pain. 30 g 0  . docusate sodium (COLACE) 100 MG capsule Take 1 capsule (100 mg total) by  mouth 2 (two) times daily. 30 capsule 0  . hydrocortisone (ANUSOL-HC) 25 MG suppository Place 25 mg rectally 2 (two) times daily as needed.    . Lactulose 20 GM/30ML SOLN Take 45 mLs (30 g total) by mouth 2 (two) times daily. 473 mL 3  . midodrine (PROAMATINE) 5 MG tablet Take 15 mg by mouth 3 (three) times daily.    . Multiple Vitamins-Minerals (MULTIVITAMIN GUMMIES ADULT) CHEW Chew 1 tablet by mouth daily. 30 tablet 11  .  octreotide (SANDOSTATIN) 500 MCG/ML SOLN injection Inject 200 mcg into the skin 3 (three) times daily.    . pantoprazole (PROTONIX) 40 MG tablet Take 1 tablet (40 mg total) by mouth 2 (two) times daily. 60 tablet 2  . pramoxine (PROCTOFOAM) 1 % foam Place 1 application rectally 3 (three) times daily as needed for itching. 15 g 0  . rifaximin (XIFAXAN) 550 MG TABS tablet Take 550 mg by mouth 2 (two) times daily.    . shark liver oil-cocoa butter (PREPARATION H) 0.25-3-85.5 % suppository Place 1 suppository rectally as needed for hemorrhoids.    . sucralfate (CARAFATE) 1 g tablet Take 1 g by mouth 3 (three) times daily as needed (stomach irritation).      Objective: BP 110/73   Pulse 89   Temp 98.4 F (36.9 C) (Oral)   Resp 17   SpO2 100%   Exam: General: Tired-appearing male, resting on his side, with grossly distended abdomen Eyes: PERRLA, subconjunctival hemorrhage of L eye ENTM: MM slightly tacky, poor dentition Neck: Supple, FROM Cardiovascular: RRR, S1, S2, no m/r/g. Palpable pulses of LEs.  Respiratory: CTAB, no crackles, no increased WOB Gastrointestinal: Distended abdomen with + fluid wave, diffusely TTP to even light palpation MSK: No LE edema, normal tone of legs an Derm: Jaundice most prominent on face. No rash on exposed skin. GU: ~1.5 cm external hemorrhoid at 7 o'clock -- still soft and pink Neuro: AOx3, no asterixis Psych: Normal mood and affect  Labs and Imaging: CBC BMET   Recent Labs Lab 04/21/17 1658  WBC 8.8  HGB 9.3*  HCT 27.2*  PLT  67*    Recent Labs Lab 04/21/17 1658  NA 130*  K 5.9*  CL 107  CO2 13*  BUN 56*  CREATININE 3.72*  GLUCOSE 133*  CALCIUM 9.1     Ct Abdomen Pelvis Wo Contrast  Result Date: 04/21/2017 CLINICAL DATA:  Acute onset right lower quadrant abdominal pain, rectal bleeding and hematemesis. Initial encounter. EXAM: CT ABDOMEN AND PELVIS WITHOUT CONTRAST TECHNIQUE: Multidetector CT imaging of the abdomen and pelvis was performed following the standard protocol without IV contrast. COMPARISON:  CT of the abdomen and pelvis performed 03/29/2017 FINDINGS: Lower chest: Diffuse coronary artery calcifications are seen. Mild bibasilar atelectasis is noted. Hepatobiliary: There is a diffusely nodular contour to the liver, compatible with hepatic cirrhosis. The patient is status post cholecystectomy, with clips noted at the gallbladder fossa. The common bile duct is grossly unremarkable in appearance. Pancreas: The pancreas is within normal limits. Spleen: The spleen is enlarged, measuring 15.8 cm in length. Scattered splenic and gastric varices are seen. Adrenals/Urinary Tract: The adrenal glands are difficult to fully assess but appear grossly unremarkable. Nonobstructing right renal stones measure up to 7 mm in size. The kidneys are otherwise unremarkable. There is no hydronephrosis. No obstructing ureteral stones are identified. No perinephric stranding is seen. Stomach/Bowel: The stomach is unremarkable in appearance. The small bowel is within normal limits. The appendix is not visualized; there is no evidence for appendicitis. The colon is unremarkable in appearance. Vascular/Lymphatic: Scattered calcification is seen along the abdominal aorta and its branches. The abdominal aorta is otherwise grossly unremarkable. The inferior vena cava is grossly unremarkable. No retroperitoneal lymphadenopathy is seen. No pelvic sidewall lymphadenopathy is identified. Reproductive: The bladder is mildly distended and grossly  unremarkable. The prostate remains normal in size. Other: Large volume ascites is noted throughout the abdomen and pelvis. Fluid extends into a tiny right inguinal hernia. Musculoskeletal: No acute  osseous abnormalities are identified. Mild facet disease is noted at the lower lumbar spine. The visualized musculature is unremarkable in appearance. IMPRESSION: 1. Large volume ascites noted throughout the abdomen and pelvis. Fluid extends into a tiny right inguinal hernia. 2. Findings of hepatic cirrhosis. Scattered splenic and gastric varices noted. 3. Splenomegaly. 4. Scattered aortic atherosclerosis. 5. Diffuse coronary artery calcification noted. 6. Nonobstructing right renal stones measure up to 7 mm size. Electronically Signed   By: Garald Balding M.D.   On: 04/21/2017 21:33   Rogue Bussing, MD 04/21/2017, 11:42 PM PGY-2, [place] [facility] pager: [phone], text pages welcome

## 2017-04-21 NOTE — ED Notes (Signed)
Patient transported to CT 

## 2017-04-21 NOTE — Anesthesia Postprocedure Evaluation (Signed)
Anesthesia Post Note  Patient: CYLAN BORUM  Procedure(s) Performed: Procedure(s) (LRB): ESOPHAGOGASTRODUODENOSCOPY (EGD) (N/A)     Anesthesia Post Evaluation  Last Vitals:  Vitals:   11/25/16 1458 11/25/16 1500  BP: 111/71 121/78  Pulse:    Resp:    Temp:      Last Pain:  Vitals:   11/25/16 0857  TempSrc:   PainSc: 0-No pain                 Armonte Tortorella,JAMES TERRILL

## 2017-04-22 ENCOUNTER — Encounter (HOSPITAL_COMMUNITY): Payer: Self-pay | Admitting: Certified Registered Nurse Anesthetist

## 2017-04-22 ENCOUNTER — Encounter (HOSPITAL_COMMUNITY)
Admission: EM | Disposition: A | Discharge status: Other Acute Inpatient Hospital | Payer: Self-pay | Source: Home / Self Care

## 2017-04-22 ENCOUNTER — Other Ambulatory Visit: Payer: Self-pay

## 2017-04-22 ENCOUNTER — Inpatient Hospital Stay (HOSPITAL_COMMUNITY): Payer: Medicare HMO | Admitting: Certified Registered"

## 2017-04-22 DIAGNOSIS — K625 Hemorrhage of anus and rectum: Secondary | ICD-10-CM

## 2017-04-22 DIAGNOSIS — K922 Gastrointestinal hemorrhage, unspecified: Secondary | ICD-10-CM

## 2017-04-22 HISTORY — PX: ESOPHAGOGASTRODUODENOSCOPY (EGD) WITH PROPOFOL: SHX5813

## 2017-04-22 LAB — COMPREHENSIVE METABOLIC PANEL
ALT: 37 U/L (ref 17–63)
AST: 52 U/L — AB (ref 15–41)
Albumin: 3.1 g/dL — ABNORMAL LOW (ref 3.5–5.0)
Alkaline Phosphatase: 75 U/L (ref 38–126)
Anion gap: 9 (ref 5–15)
BILIRUBIN TOTAL: 3.3 mg/dL — AB (ref 0.3–1.2)
BUN: 59 mg/dL — AB (ref 6–20)
CO2: 14 mmol/L — ABNORMAL LOW (ref 22–32)
CREATININE: 3.64 mg/dL — AB (ref 0.61–1.24)
Calcium: 8.6 mg/dL — ABNORMAL LOW (ref 8.9–10.3)
Chloride: 108 mmol/L (ref 101–111)
GFR, EST AFRICAN AMERICAN: 19 mL/min — AB (ref >60)
GFR, EST NON AFRICAN AMERICAN: 17 mL/min — AB (ref >60)
Glucose, Bld: 102 mg/dL — ABNORMAL HIGH (ref 65–99)
Potassium: 6.4 mmol/L (ref 3.5–5.1)
Sodium: 131 mmol/L — ABNORMAL LOW (ref 135–145)
TOTAL PROTEIN: 5.8 g/dL — AB (ref 6.5–8.1)

## 2017-04-22 LAB — CBC
HCT: 24.5 % — ABNORMAL LOW (ref 39.0–52.0)
Hemoglobin: 8.3 g/dL — ABNORMAL LOW (ref 13.0–17.0)
MCH: 31 pg (ref 26.0–34.0)
MCHC: 33.9 g/dL (ref 30.0–36.0)
MCV: 91.4 fL (ref 78.0–100.0)
Platelets: 38 10*3/uL — ABNORMAL LOW (ref 150–400)
RBC: 2.68 MIL/uL — AB (ref 4.22–5.81)
RDW: 15.4 % (ref 11.5–15.5)
WBC: 6.6 10*3/uL (ref 4.0–10.5)

## 2017-04-22 LAB — URINALYSIS, ROUTINE W REFLEX MICROSCOPIC
BILIRUBIN URINE: NEGATIVE
BILIRUBIN URINE: NEGATIVE
GLUCOSE, UA: NEGATIVE mg/dL
Glucose, UA: NEGATIVE mg/dL
KETONES UR: NEGATIVE mg/dL
Ketones, ur: NEGATIVE mg/dL
NITRITE: NEGATIVE
Nitrite: NEGATIVE
PH: 5 (ref 5.0–8.0)
Protein, ur: NEGATIVE mg/dL
Protein, ur: NEGATIVE mg/dL
SPECIFIC GRAVITY, URINE: 1.015 (ref 1.005–1.030)
SPECIFIC GRAVITY, URINE: 1.01 (ref 1.005–1.030)
pH: 5 (ref 5.0–8.0)

## 2017-04-22 LAB — OCCULT BLOOD X 1 CARD TO LAB, STOOL: Fecal Occult Bld: POSITIVE — AB

## 2017-04-22 LAB — GLUCOSE, CAPILLARY
GLUCOSE-CAPILLARY: 118 mg/dL — AB (ref 65–99)
GLUCOSE-CAPILLARY: 95 mg/dL (ref 65–99)
Glucose-Capillary: 89 mg/dL (ref 65–99)
Glucose-Capillary: 93 mg/dL (ref 65–99)

## 2017-04-22 LAB — GLUCOSE, RANDOM: Glucose, Bld: 105 mg/dL — ABNORMAL HIGH (ref 65–99)

## 2017-04-22 LAB — PROTIME-INR
INR: 1.62
PROTHROMBIN TIME: 19.4 s — AB (ref 11.4–15.2)

## 2017-04-22 LAB — MAGNESIUM: MAGNESIUM: 1.8 mg/dL (ref 1.7–2.4)

## 2017-04-22 LAB — POTASSIUM
POTASSIUM: 6 mmol/L — AB (ref 3.5–5.1)
POTASSIUM: 5.3 mmol/L — AB (ref 3.5–5.1)
Potassium: 5.8 mmol/L — ABNORMAL HIGH (ref 3.5–5.1)
Potassium: 5.4 mmol/L — ABNORMAL HIGH (ref 3.5–5.1)
Potassium: 5.8 mmol/L — ABNORMAL HIGH (ref 3.5–5.1)

## 2017-04-22 LAB — MRSA PCR SCREENING: MRSA BY PCR: NEGATIVE

## 2017-04-22 LAB — CREATININE, URINE, RANDOM: CREATININE, URINE: 192.64 mg/dL

## 2017-04-22 LAB — NA AND K (SODIUM & POTASSIUM), RAND UR
POTASSIUM UR: 33 mmol/L
Sodium, Ur: <10 mmol/L

## 2017-04-22 SURGERY — ESOPHAGOGASTRODUODENOSCOPY (EGD) WITH PROPOFOL
Anesthesia: Monitor Anesthesia Care

## 2017-04-22 MED ORDER — PROPOFOL 500 MG/50ML IV EMUL
INTRAVENOUS | Status: DC | PRN
  Administered 2017-04-22: 50 ug/kg/min via INTRAVENOUS

## 2017-04-22 MED ORDER — BUPROPION HCL ER (XL) 150 MG PO TB24
150.0000 mg | ORAL_TABLET | ORAL | Status: DC
  Administered 2017-04-23 – 2017-04-27 (×4): 150 mg via ORAL
  Filled 2017-04-22 (×4): qty 1

## 2017-04-22 MED ORDER — CIPROFLOXACIN HCL 500 MG PO TABS: 500.0000 mg | ORAL_TABLET | Freq: Every day | ORAL | Status: DC

## 2017-04-22 MED ORDER — DEXTROSE 5 % IV SOLN
2.0000 g | INTRAVENOUS | Status: DC
  Filled 2017-04-22: qty 2

## 2017-04-22 MED ORDER — MIDODRINE HCL 5 MG PO TABS
10.0000 mg | ORAL_TABLET | Freq: Three times a day (TID) | ORAL | Status: DC
  Administered 2017-04-22 – 2017-04-23 (×4): 10 mg via ORAL
  Filled 2017-04-22 (×4): qty 2

## 2017-04-22 MED ORDER — DOCUSATE SODIUM 100 MG PO CAPS
100.0000 mg | ORAL_CAPSULE | Freq: Two times a day (BID) | ORAL | Status: DC
  Administered 2017-04-22 – 2017-04-27 (×10): 100 mg via ORAL
  Filled 2017-04-22 (×11): qty 1

## 2017-04-22 MED ORDER — SODIUM BICARBONATE 650 MG PO TABS
650.0000 mg | ORAL_TABLET | Freq: Three times a day (TID) | ORAL | Status: DC
  Administered 2017-04-22 – 2017-04-23 (×3): 650 mg via ORAL
  Filled 2017-04-22 (×3): qty 1

## 2017-04-22 MED ORDER — ONDANSETRON HCL 4 MG/2ML IJ SOLN: 4.0000 mg | Freq: Once | INTRAMUSCULAR | Status: DC | PRN

## 2017-04-22 MED ORDER — MIDODRINE HCL 5 MG PO TABS
15.0000 mg | ORAL_TABLET | Freq: Once | ORAL | Status: DC
Start: 2017-04-22 — End: 2017-04-22

## 2017-04-22 MED ORDER — DEXTROSE 5 % IV SOLN: 1.0000 g | INTRAVENOUS | Status: DC

## 2017-04-22 MED ORDER — PROPOFOL 10 MG/ML IV BOLUS
INTRAVENOUS | Status: DC | PRN
  Administered 2017-04-22: 20 mg via INTRAVENOUS

## 2017-04-22 MED ORDER — INSULIN ASPART 100 UNIT/ML IV SOLN
10.0000 [IU] | Freq: Once | INTRAVENOUS | Status: AC
  Administered 2017-04-22: 10 [IU] via INTRAVENOUS

## 2017-04-22 MED ORDER — LACTULOSE 10 GM/15ML PO SOLN
30.0000 g | Freq: Four times a day (QID) | ORAL | Status: DC
  Administered 2017-04-22 – 2017-04-27 (×13): 30 g via ORAL
  Filled 2017-04-22 (×18): qty 45

## 2017-04-22 MED ORDER — DIBUCAINE 1 % EX OINT: TOPICAL_OINTMENT | Freq: Three times a day (TID) | CUTANEOUS | Status: DC | PRN

## 2017-04-22 MED ORDER — FUROSEMIDE 10 MG/ML IJ SOLN
40.0000 mg | Freq: Once | INTRAMUSCULAR | Status: AC
  Administered 2017-04-22: 40 mg via INTRAVENOUS
  Filled 2017-04-22: qty 4

## 2017-04-22 MED ORDER — ALBUMIN HUMAN 25 % IV SOLN
100.0000 g | Freq: Once | INTRAVENOUS | Status: AC
  Administered 2017-04-22: 100 g via INTRAVENOUS
  Filled 2017-04-22: qty 50
  Filled 2017-04-22: qty 400

## 2017-04-22 MED ORDER — ADULT MULTIVITAMIN W/MINERALS CH
1.0000 | ORAL_TABLET | Freq: Every day | ORAL | Status: DC
  Administered 2017-04-23 – 2017-04-27 (×5): 1 via ORAL
  Filled 2017-04-22 (×5): qty 1

## 2017-04-22 MED ORDER — PROMETHAZINE HCL 25 MG/ML IJ SOLN: 12.5000 mg | Freq: Four times a day (QID) | INTRAMUSCULAR | Status: DC | PRN

## 2017-04-22 MED ORDER — SODIUM CHLORIDE 0.9 % IV SOLN
INTRAVENOUS | Status: DC | PRN
  Administered 2017-04-22: 14:00:00 via INTRAVENOUS

## 2017-04-22 MED ORDER — FENTANYL CITRATE (PF) 100 MCG/2ML IJ SOLN: 25.0000 ug | INTRAMUSCULAR | Status: DC | PRN

## 2017-04-22 MED ORDER — SODIUM POLYSTYRENE SULFONATE 15 GM/60ML PO SUSP
30.0000 g | Freq: Once | ORAL | Status: AC
  Administered 2017-04-22: 30 g via ORAL
  Filled 2017-04-22: qty 120

## 2017-04-22 MED ORDER — DEXTROSE 5 % IV SOLN
1.0000 g | INTRAVENOUS | Status: AC
  Administered 2017-04-22: 1 g via INTRAVENOUS
  Filled 2017-04-22: qty 10

## 2017-04-22 MED ORDER — SODIUM CHLORIDE 0.9 % IV SOLN
1.0000 g | Freq: Once | INTRAVENOUS | Status: AC
  Administered 2017-04-22: 1 g via INTRAVENOUS
  Filled 2017-04-22: qty 10

## 2017-04-22 MED ORDER — SODIUM CHLORIDE 0.9 % IV SOLN
INTRAVENOUS | Status: DC
  Administered 2017-04-22: 04:00:00 via INTRAVENOUS

## 2017-04-22 MED ORDER — MULTIVITAMIN GUMMIES ADULT PO CHEW: 1.0000 | CHEWABLE_TABLET | Freq: Every day | ORAL | Status: DC

## 2017-04-22 MED ORDER — BUTAMBEN-TETRACAINE-BENZOCAINE 2-2-14 % EX AERO
INHALATION_SPRAY | CUTANEOUS | Status: DC | PRN
  Administered 2017-04-22: 2 via TOPICAL

## 2017-04-22 MED ORDER — DEXTROSE 50 % IV SOLN
1.0000 | Freq: Once | INTRAVENOUS | Status: AC
  Administered 2017-04-22: 50 mL via INTRAVENOUS
  Filled 2017-04-22: qty 50

## 2017-04-22 MED ORDER — PROMETHAZINE HCL 25 MG PO TABS
25.0000 mg | ORAL_TABLET | Freq: Four times a day (QID) | ORAL | Status: DC | PRN
  Administered 2017-04-22: 25 mg via ORAL
  Filled 2017-04-22: qty 1

## 2017-04-22 SURGICAL SUPPLY — 14 items

## 2017-04-22 NOTE — Progress Notes (Signed)
EGD well-tolerated.  No bleeding, no definite source of bleeding seen.  Small varix banded, portal hypertensive gastropathy present.  Please see procedure rept.  Will allow clr liq diet.    No other chg in mgt recommended on the basis of these findings.  Cleotis Nipper, M.D. Pager [phone] If no answer or after 5 PM call [phone]

## 2017-04-22 NOTE — Plan of Care (Signed)
Problem: Education: Goal: Knowledge of Vincent Ochoa General Education information/materials will improve Outcome: Progressing Discussed with patient about plan of care for the evening and eating slowly with clear liquid diet being added with some teach back displayed.

## 2017-04-22 NOTE — Transfer of Care (Signed)
Immediate Anesthesia Transfer of Care Note  Patient: Vincent Ochoa  Procedure(s) Performed: Procedure(s): ESOPHAGOGASTRODUODENOSCOPY (EGD) WITH PROPOFOL (N/A)  Patient Location: PACU  Anesthesia Type:MAC  Level of Consciousness: awake and alert   Airway & Oxygen Therapy: Patient Spontanous Breathing and Patient connected to nasal cannula oxygen  Post-op Assessment: Report given to RN, Post -op Vital signs reviewed and stable and Patient moving all extremities X 4  Post vital signs: Reviewed and stable  Last Vitals:  Vitals:   04/22/17 1413 04/22/17 1458  BP: (!) 102/57 (!) 109/57  Pulse: 84 85  Resp: 16 12  Temp: 36.7 C     Last Pain:  Vitals:   04/22/17 1413  TempSrc: Oral  PainSc:          Complications: No apparent anesthesia complications

## 2017-04-22 NOTE — Progress Notes (Signed)
Recent K = 5.8.  Spoke w/ Dr. Gifford Shave of Anesth who is ok w/ proceeding w/ egd, which will be done later this afternoon.  Endo staff and pt's ICU nurse aware.  Cleotis Nipper, M.D. Pager [phone] If no answer or after 5 PM call [phone]

## 2017-04-22 NOTE — Consult Note (Addendum)
Reason for Consult: Acute kidney injury on chronic kidney disease stage III Referring Physician: Esmond Camper M.D. (FPTS)  HPI:  61 year old Caucasian man with past medical history significant for coronary artery disease, history of CVA, cirrhosis of unclear etiology with end-stage liver disease who is in the process of being evaluated/listed for liver transplantation. It appears from review of records that he has baseline chronic kidney disease stage III with creatinine that has ranged from anywhere between 1.3-2.2 over the past 6 months. (He and his wife informed me that they have been seen by nephrology at [facility] as well as Dr. Brayton El when admitted to [facility]).  He was admitted with 2 episodes of hematemesis as well as multiple bloody bowel movements overnight. His labs are significant for hyperkalemia and evidence of acute kidney injury with creatinine of 3.7 that is 3.6 today. He denies any use of NSAIDs in the recent past and states that he was taken off of spironolactone earlier this year in March. He was placed on octreotide overnight. He reports that on Tuesday of this week (4 days ago) he had 10 L paracentesis with 50 g IV albumin infusion. Urinalysis from yesterday shows negative proteinuria with RBCs TNTC. Urine sodium is <10 and urine creatinine was 192.   03/31/2017  04/01/2017  04/02/2017  04/21/2017  04/22/2017   BUN 53 (H) 51 (H) 49 (H) 56 (H) 59 (H)  Creatinine 2.19 (H) 2.08 (H) 2.28 (H) 3.72 (H) 3.64 (H)    Past Medical History:  Diagnosis Date  . Arthritis    "hands, knees, back" (07/05/2016)  . Back pain, lumbosacral 2014  . CHF (congestive heart failure) ([place])   . Cirrhosis ([place])   . Coronary artery disease    a. Evaluated by Dr. Stanford Breed 2012 - nonobstructive 2007-2008 by cath. b. Abnormal stress test 10/2014 but cath deferred temporarily due to thrombocytopenia. c. 10/2014: readm with AF-RVR/NSTEMI - s/p BMS to mLCx 11/04/14 (mod dz in RCA);  d. relook cath  01/2015 and 06/02/2015 patent stent, nonobs CAD, EF 55-65%.  . Elevated LFTs   . Facial cellulitis 2014   Periorbital  . Fatty liver US - 2012  . Frequent headaches   . GERD (gastroesophageal reflux disease)   . History of blood transfusion 01/2016   "lost alot of blood vomiting"  . Hyperlipidemia   . Morbid obesity ([place])   . Obesity   . OSA on CPAP    severe with AHI 56/hr with successful CPAP titration to 18cm H2O  . PAF (paroxysmal atrial fibrillation) ([place])    a. Episode 10/2014 at time of NSTEMI, spont converted to NSR. Observation for further episodes (not on anticoag at present time due to [facility] = 1, thrombocytopenia).  . Psoriasis   . Sinus bradycardia   . Stroke [facility])    "I've been told I'd had a mini stroke on my left side" (07/05/2016)  . Thrombocytopenia ([place])    a. Onset unclear, but noted 2012. ? 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 s/p prednisone, IVIG.    Past Surgical History:  Procedure Laterality Date  . CARDIAC CATHETERIZATION    . CARDIAC CATHETERIZATION N/A 06/02/2015   Procedure: Left Heart Cath and Coronary Angiography;  Surgeon: Jettie Booze, MD;  Location: [facility];  Service: Cardiovascular;  Laterality: N/A;  . COLONOSCOPY WITH PROPOFOL N/A 09/26/2016   Procedure: COLONOSCOPY WITH PROPOFOL;  Surgeon: Wilford Corner, MD;  Location: [facility] ENDOSCOPY;  Service: Endoscopy;  Laterality: N/A;  . ESOPHAGOGASTRODUODENOSCOPY N/A 06/22/2016   Procedure: ESOPHAGOGASTRODUODENOSCOPY (EGD);  Surgeon: Clarene Essex, MD;  Location: [facility] ENDOSCOPY;  Service: Endoscopy;  Laterality: N/A;  . ESOPHAGOGASTRODUODENOSCOPY N/A 11/24/2016   Procedure: ESOPHAGOGASTRODUODENOSCOPY (EGD);  Surgeon: Clarene Essex, MD;  Location: [facility] ENDOSCOPY;  Service: Endoscopy;  Laterality: N/A;  . ESOPHAGOGASTRODUODENOSCOPY (EGD) WITH PROPOFOL N/A 08/17/2016   Procedure: ESOPHAGOGASTRODUODENOSCOPY (EGD) WITH PROPOFOL;  Surgeon: Otis Brace, MD;  Location:  [place];  Service: Gastroenterology;  Laterality: N/A;  . FACIAL COSMETIC SURGERY  1990s   "rodeo-related injury"  . IR GENERIC HISTORICAL  09/14/2016   IR RADIOLOGIST EVAL & MGMT 09/14/2016 Sandi Mariscal, MD [facility] INTERV RAD  . IR GENERIC HISTORICAL  12/15/2016   IR RADIOLOGIST EVAL & MGMT 12/15/2016 Sandi Mariscal, MD [facility] INTERV RAD  . IR GENERIC HISTORICAL  02/09/2017   IR PARACENTESIS 02/09/2017 Saverio Danker, PA-C [facility]  . IR PARACENTESIS  03/08/2017  . IR PARACENTESIS  03/14/2017  . IR PARACENTESIS  03/21/2017  . IR PARACENTESIS  03/24/2017  . IR PARACENTESIS  03/30/2017  . IR PARACENTESIS  03/31/2017  . IR PARACENTESIS  04/11/2017  . IR PARACENTESIS  04/18/2017  . KNEE ARTHROPLASTY Left 1970's   "put cartilage in it"  . LAPAROSCOPIC CHOLECYSTECTOMY  2007  . LEFT HEART CATHETERIZATION WITH CORONARY ANGIOGRAM N/A 11/04/2014   Procedure: LEFT HEART CATHETERIZATION WITH CORONARY ANGIOGRAM;  Surgeon: Jettie Booze, MD;  Location: [facility] CATH LAB;  Service: Cardiovascular;  Laterality: N/A;  . LEFT HEART CATHETERIZATION WITH CORONARY ANGIOGRAM N/A 01/28/2015   Procedure: LEFT HEART CATHETERIZATION WITH CORONARY ANGIOGRAM;  Surgeon: Sherren Mocha, MD;  Location: [facility] CATH LAB;  Service: Cardiovascular;  Laterality: N/A;  . liver cirrhosis    . RADIOLOGY WITH ANESTHESIA N/A 02/04/2016   Procedure: RADIOLOGY WITH ANESTHESIA;  Surgeon: Medication Radiologist, MD;  Location: [place];  Service: Radiology;  Laterality: N/A;  . TONSILLECTOMY  1960's    Family History  Problem Relation Age of Onset  . Cirrhosis Mother   . Heart attack Mother   . Hypertension Mother   . Hypertension Father   . Stroke Father   . Arthritis Maternal Grandmother   . Heart disease Sister   . Diabetes Sister   . Ovarian cancer Sister   . Heart disease Other        Maternal/paternal grandparents  . Heart attack Maternal Aunt     Social History:  reports that he quit smoking about 14 months ago. His smoking use  included Cigarettes. He has a 64.50 pack-year smoking history. He has quit using smokeless tobacco. He reports that he drinks alcohol. He reports that he does not use drugs.  Allergies:  Allergies  Allergen Reactions  . Asa [Aspirin] Other (See Comments)    Other reaction(s): Other (See Comments) Previous hemmorhage Bleeding Previous hemmorhage  . Eliquis [Apixaban] Other (See Comments)    bleeding Potentiality of internal bleeding  . Nsaids Other (See Comments) and Anaphylaxis    May cause bleeding issues (per wife) May only have 1 plain Tylenol every 8 hours is needed for flu symptoms, per doctor.  . Plavix [Clopidogrel] Other (See Comments)    Bleeding Potentiality of internal bleeding  . Statins Other (See Comments)    Other reaction(s): Other (See Comments) Potentially may make patient bleed internally (per wife) Potentially may make patient bleed internally (per wife)  . Tolmetin     Other reaction(s): Other (See Comments) May cause bleeding issues (per wife) May only have 1  plain Tylenol every 8 hours is needed for flu symptoms, per doctor.    Medications:  Scheduled: . sodium chloride flush  3 mL Intravenous Q12H    BMP Latest Ref Rng & Units 04/22/2017 04/22/2017 04/22/2017  Glucose 65 - 99 mg/dL - - 102(H)  BUN 6 - 20 mg/dL - - 59(H)  Creatinine 0.61 - 1.24 mg/dL - - 3.64(H)  Sodium 135 - 145 mmol/L - - 131(L)  Potassium 3.5 - 5.1 mmol/L 5.8(H) 6.0(H) 6.4(HH)  Chloride 101 - 111 mmol/L - - 108  CO2 22 - 32 mmol/L - - 14(L)  Calcium 8.9 - 10.3 mg/dL - - 8.6(L)   CBC Latest Ref Rng & Units 04/22/2017 04/21/2017 04/14/2017  WBC 4.0 - 10.5 K/uL 6.6 8.8 -  Hemoglobin 13.0 - 17.0 g/dL 8.3(L) 9.3(L) 9.0(A)  Hematocrit 39.0 - 52.0 % 24.5(L) 27.2(L) -  Platelets 150 - 400 K/uL 38(L) 67(L) -     Ct Abdomen Pelvis Wo Contrast  Result Date: 04/21/2017 CLINICAL DATA:  Acute onset right lower quadrant abdominal pain, rectal bleeding and hematemesis. Initial encounter. EXAM: CT  ABDOMEN AND PELVIS WITHOUT CONTRAST TECHNIQUE: Multidetector CT imaging of the abdomen and pelvis was performed following the standard protocol without IV contrast. COMPARISON:  CT of the abdomen and pelvis performed 03/29/2017 FINDINGS: Lower chest: Diffuse coronary artery calcifications are seen. Mild bibasilar atelectasis is noted. Hepatobiliary: There is a diffusely nodular contour to the liver, compatible with hepatic cirrhosis. The patient is status post cholecystectomy, with clips noted at the gallbladder fossa. The common bile duct is grossly unremarkable in appearance. Pancreas: The pancreas is within normal limits. Spleen: The spleen is enlarged, measuring 15.8 cm in length. Scattered splenic and gastric varices are seen. Adrenals/Urinary Tract: The adrenal glands are difficult to fully assess but appear grossly unremarkable. Nonobstructing right renal stones measure up to 7 mm in size. The kidneys are otherwise unremarkable. There is no hydronephrosis. No obstructing ureteral stones are identified. No perinephric stranding is seen. Stomach/Bowel: The stomach is unremarkable in appearance. The small bowel is within normal limits. The appendix is not visualized; there is no evidence for appendicitis. The colon is unremarkable in appearance. Vascular/Lymphatic: Scattered calcification is seen along the abdominal aorta and its branches. The abdominal aorta is otherwise grossly unremarkable. The inferior vena cava is grossly unremarkable. No retroperitoneal lymphadenopathy is seen. No pelvic sidewall lymphadenopathy is identified. Reproductive: The bladder is mildly distended and grossly unremarkable. The prostate remains normal in size. Other: Large volume ascites is noted throughout the abdomen and pelvis. Fluid extends into a tiny right inguinal hernia. Musculoskeletal: No acute osseous abnormalities are identified. Mild facet disease is noted at the lower lumbar spine. The visualized musculature is  unremarkable in appearance. IMPRESSION: 1. Large volume ascites noted throughout the abdomen and pelvis. Fluid extends into a tiny right inguinal hernia. 2. Findings of hepatic cirrhosis. Scattered splenic and gastric varices noted. 3. Splenomegaly. 4. Scattered aortic atherosclerosis. 5. Diffuse coronary artery calcification noted. 6. Nonobstructing right renal stones measure up to 7 mm size. Electronically Signed   By: Garald Balding M.D.   On: 04/21/2017 21:33    Review of Systems  Constitutional: Positive for chills and malaise/fatigue. Negative for fever.  HENT: Negative.   Eyes: Negative.   Respiratory: Positive for shortness of breath.        Endorses exertional dyspnea and intermittent orthopnea  Cardiovascular: Positive for orthopnea and leg swelling. Negative for chest pain and palpitations.  Gastrointestinal: Positive for blood in  stool, nausea and vomiting.  Genitourinary: Negative.   Musculoskeletal: Positive for back pain and myalgias.  Skin: Positive for itching. Negative for rash.  Neurological: Positive for weakness. Negative for focal weakness.  Psychiatric/Behavioral: The patient is nervous/anxious.    Blood pressure 101/62, pulse 83, temperature 97.9 F (36.6 C), temperature source Oral, resp. rate 14, height 6\' 3"  (1.905 m), weight 102.8 kg (226 lb 10.1 oz), SpO2 100 %. Physical Exam  Nursing note and vitals reviewed. Constitutional: He is oriented to person, place, and time. He appears well-developed and well-nourished. No distress.  HENT:  Head: Normocephalic and atraumatic.  Mouth/Throat: Oropharynx is clear and moist.  Eyes: Conjunctivae are normal. Pupils are equal, round, and reactive to light.  Neck: Normal range of motion. Neck supple. JVD present. No thyromegaly present.  8-9cm  Cardiovascular: Normal rate and regular rhythm.   Murmur heard. Ejection systolic murmur  Respiratory: Effort normal. He has no wheezes. He has no rales.  Decreased breath sounds  over bases without any distinct rales or rhonchi  GI: Soft. He exhibits distension. There is tenderness. There is no guarding.  Grossly distended abdomen with large amount of ascites  Musculoskeletal: He exhibits edema.  1-2 plus edema  Neurological: He is alert and oriented to person, place, and time.  Skin: Skin is warm and dry.    Assessment/Plan: 1. Acute kidney injury on chronic kidney disease stage III: Based on the initial data-highly suggestive of intravascular volume contraction (the presence of hematuria at this time precludes claiming this as type I HRS although risk factors are definitely present including recent GI bleed). Will give 100 g of intravenous albumin and discontinue normal saline to avoid further worsening of his ascites which intern can also cause intra-abdominal hypertension and worsening renal function. His abdomen is mildly tender but not tense to suggest abdominal compartment syndrome. Will repeat urinalysis to follow urine RBCs. At this time, no indications for dialysis and will maximize efforts at medically treating his hyperkalemia. Restart outpatient doses of midodrine 15 mg 3 times a day to complement current use of octreotide and albumin. Agree with plans for transfer to the liver service at [facility] if he has worsening kidney failure to a point where renal replacement is needed. 2. Hyperkalemia: Secondary to acute kidney injury. He denies any recent use of spironolactone or other significant intake of potassium. Agree with plans for Kayexalate at this time and will attempt albumin infusion to try and promote urine output. 3. Upper GI bleed: Seen earlier by gastroenterology-Dr. Cristina Gong who discussed his plan with me and the suspicion that the patient likely has portal gastropathy given the self-limited nature of his upper GI bleed and the relative stability of his hemoglobin/hematocrit overnight.  4. Anemia: Likely anemia of chronic disease and recently aggravated  by GI bleed. Continue to monitor for transfusion triggers-no further GI bleed noted and he remains hemodynamically stable. 5. Anion gap metabolic acidosis: Most likely secondary to acute kidney injury-we'll start oral sodium bicarbonate.  Gasper Hopes K. 04/22/2017, 12:08 PM

## 2017-04-22 NOTE — Progress Notes (Signed)
Family Medicine Teaching Service Daily Progress Note Intern Pager: [phone]  Patient name: Vincent Ochoa Medical record number: [id number] Date of birth: 05-Feb-1956 Age: 61 y.o. Gender: male  Primary Care Provider: Bufford Lope, DO Consultants: GI, renal Code Status: Full  Pt Overview and Major Events to Date:  6/1 - Admitted for hematemesis  Assessment and Plan: Vincent Ochoa is a 61 y.o. male presenting with bright red vomitus and abdominal pain. PMH is significant for cirrhosis of liver with recurrent ascites (followed by [facility] hepatology), CAD (stent), psoriasis, thrombocytopenia (followed by [facility]), OSA, knee pain, obesity, GERD, Depression, PAF, HTN, esophageal varices, anemia of chronic disease  Hematemesis. Occurred 2x and none since admission. Hgb 8.3 today, down from 9.3 (BL ~ 9.0). Maintaining blood pressure. Concerning due to history of variceal bleeds in setting of end-stage cirrhosis with HRS. 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  - Admitted to SDU, attending Dr. Mingo Amber - GI consulted in ED; Dr. Cristina Gong to see today and planning to perform EGD - Continue IV ceftriaxone and octreotide for infection prophylaxis until active bleed rule-out by EGD; then switch back to home PO cipro and IM octreotide - IV protonix - 2 large bore IVs placed - IV phenergan 12.5 mg q6h  Hematochezia. Ongoing per patient since colonoscopy 2 weeks ago. Seen with stools only. - Monitor hgb  Abdominal pain. Chronic recurrent ascites 2/2 liver failure. CT abdomen pelvis without evidence of bowel perforation. Large volume ascites noted. No leukocytosis or fever. Lipase elevated at 105 but in setting of chronic liver disease and worsening kidney function. No growth to date from peritoneal fluid culture and negative gram stain from 04/18/17.  - Monitor pain - IV fentanyl 25 mg q6h prn - Having weekly paracenteses; consider early treatment   AKI. SCr  elevated to 3.72 on from new BL ~ 2.0. Mildly improved at 3.64 today. Suspect this indicates worsening of hepatorenal syndrome. Dehydration in setting of vomiting and nausea may also be contributing. - Ordered NS 75 mL/hr x 12 hours - CMP in a.m. - Renal consulted, appreciate recommendations  Hyperkalemia. Increased to 6.4 this am > 6.0 after insulin with glucose and another dose of kayelxalate. K 5.9 on admission. Denies chest pain or palpitations. In setting of renal failure and liver failure that could be causing relative adrenal insufficiency. No EKG changes x 2.  - Continue to monitor  Liver Cirrhosis. MELD score 31 (52.6% mortality risk in next 3 months). Followed by [facility] Hepatology and planning for possible liver transplant. - Will contact [facility] in a.m. with FYI of admission - Restart daily cipro for SBP prophylaxis once ceftriaxone discontinued - Restart home lactulose 30 g BID when no longer NPO - Patient reports was started on rifaximin after last hospitalization but has not been taking due to coverage issues; restart once NPO - Restart midodrine 15 mg TID after EGD  Hemorrhoids. More comfortable today. Chronic, worsened by recent colonoscopy, per patient - Monitor for signs of thrombosis given degree of pain - Proctofoam and anusol suppository ordered prn for comfort  GERD:Stable. At home on Protonix 40 mg po BID and sucralfate 1g PO TID.  - Restart home regimen after EGD, pending GI recs  URK:YHCWCB. Echo 11/23/2016 with EF 50-55%. No signs of edema or crackles. Lasix and spiro discontinued at last admission to [facility] 4/10-4/14.  - Monitor for signs of fluid overload on exam  CAD:S/p Left heart cath July 2016 showing nonobstructive coronary artery disease. Mid  cx lesion previously treated with a bare metal stent in 10/2014. Follows with Dr. Burt Knack outpatient.   Thrombocytopenia:Stable. On admission, plt count of 38. At prior admission on 12/21, plt count of 47. At  home on Promacta 100 mg po daily.  - Continue home regimen once eating - Monitor  TDV:VOHYWV. To be using CPAP but awaiting equipment.   - Reorder CPAP once vomiting resolved  Depression:Stable. At home on Wellbutrin.  - Order wellbutrin 150 mg po daily once taking PO  PAF:On problem list. Not on pharmacologic treatment currently given history of bleeds.   FEN/GI: heart healthy/carb mod  Prophylaxis: SCDs  Disposition: Pending GI workup  Subjective:  Patient says abdominal pain a little better this morning. Hurts mostly across bottom of his abdomen. He has not had further hematemesis. Does report stool with BRB with kayexalate.   Objective: Temp:  [97.9 F (36.6 C)-98.4 F (36.9 C)] 98 F (36.7 C) (06/02 0800) Pulse Rate:  [80-92] 88 (06/02 0800) Resp:  [12-24] 12 (06/02 0800) BP: (70-129)/(45-84) 103/56 (06/02 0800) SpO2:  [93 %-100 %] 100 % (06/02 0800) Weight:  [226 lb 10.1 oz (102.8 kg)] 226 lb 10.1 oz (102.8 kg) (06/02 0300) Physical Exam: General: Chronically ill appearing man, jaundiced Cardiovascular: RRR, S1, S2, no m/r/g Respiratory: CTAB, talking in complete sentences with ease Abdomen: +BS, + fluid wave, tense but still soft, TTP throughout Extremities: No LE edema Neuro: AOx3  Laboratory:  Recent Labs Lab 04/21/17 1658 04/22/17 0500  WBC 8.8 6.6  HGB 9.3* 8.3*  HCT 27.2* 24.5*  PLT 67* 38*    Recent Labs Lab 04/21/17 1658 04/22/17 0500  NA 130* 131*  K 5.9* 6.4*  CL 107 108  CO2 13* 14*  BUN 56* 59*  CREATININE 3.72* 3.64*  CALCIUM 9.1 8.6*  PROT 6.6 5.8*  BILITOT 2.9* 3.3*  ALKPHOS 85 75  ALT 38 37  AST 62* 52*  GLUCOSE 133* 102*    Imaging/Diagnostic Tests: Ct Abdomen Pelvis Wo Contrast  Result Date: 04/21/2017 CLINICAL DATA:  Acute onset right lower quadrant abdominal pain, rectal bleeding and hematemesis. Initial encounter. EXAM: CT ABDOMEN AND PELVIS WITHOUT CONTRAST TECHNIQUE: Multidetector CT imaging of the  abdomen and pelvis was performed following the standard protocol without IV contrast. COMPARISON:  CT of the abdomen and pelvis performed 03/29/2017 FINDINGS: Lower chest: Diffuse coronary artery calcifications are seen. Mild bibasilar atelectasis is noted. Hepatobiliary: There is a diffusely nodular contour to the liver, compatible with hepatic cirrhosis. The patient is status post cholecystectomy, with clips noted at the gallbladder fossa. The common bile duct is grossly unremarkable in appearance. Pancreas: The pancreas is within normal limits. Spleen: The spleen is enlarged, measuring 15.8 cm in length. Scattered splenic and gastric varices are seen. Adrenals/Urinary Tract: The adrenal glands are difficult to fully assess but appear grossly unremarkable. Nonobstructing right renal stones measure up to 7 mm in size. The kidneys are otherwise unremarkable. There is no hydronephrosis. No obstructing ureteral stones are identified. No perinephric stranding is seen. Stomach/Bowel: The stomach is unremarkable in appearance. The small bowel is within normal limits. The appendix is not visualized; there is no evidence for appendicitis. The colon is unremarkable in appearance. Vascular/Lymphatic: Scattered calcification is seen along the abdominal aorta and its branches. The abdominal aorta is otherwise grossly unremarkable. The inferior vena cava is grossly unremarkable. No retroperitoneal lymphadenopathy is seen. No pelvic sidewall lymphadenopathy is identified. Reproductive: The bladder is mildly distended and grossly unremarkable. The prostate remains normal in  size. Other: Large volume ascites is noted throughout the abdomen and pelvis. Fluid extends into a tiny right inguinal hernia. Musculoskeletal: No acute osseous abnormalities are identified. Mild facet disease is noted at the lower lumbar spine. The visualized musculature is unremarkable in appearance. IMPRESSION: 1. Large volume ascites noted throughout the  abdomen and pelvis. Fluid extends into a tiny right inguinal hernia. 2. Findings of hepatic cirrhosis. Scattered splenic and gastric varices noted. 3. Splenomegaly. 4. Scattered aortic atherosclerosis. 5. Diffuse coronary artery calcification noted. 6. Nonobstructing right renal stones measure up to 7 mm size. Electronically Signed   By: Garald Balding M.D.   On: 04/21/2017 21:33   Rogue Bussing, MD 04/22/2017, 8:41 AM PGY-2, [place] Intern pager: [phone], text pages welcome

## 2017-04-22 NOTE — Consult Note (Signed)
Referring Provider:  Dr. Deno Etienne ([place]) Primary Care Physician:  Bufford Lope, DO Primary Gastroenterologist:  Dr. Clarene Essex; also followed at [facility]  Reason for Consultation:  Hematemesis  HPI: Vincent Ochoa is a 61 y.o. male with advanced cirrhosis of unclear cause, with a prior history of GI bleeding, who yesterday had 2 episodes of moderate volume (2 handfuls) hematemesis (he states he also vomited old food from days before), as well as multiple bloody bowel movements. This occurred without syncope. No antecedent exposure to ulcerogenic medications.  The patient is known to have portal gastropathy and esophageal varices, with his most recent endoscopy in January of this year showing very small varices which were banded 3.  Since coming to the hospital, the patient has been hemodynamically stable and without further active bleeding, on octreotide. The patient's last bowel movement was approximately 8 hours prior to my consultation; he then had an additional bowel movement while I was in the room, and it was light tan in color, with small strands of mucoid red blood.  The patient is being followed by the liver transplant service at [facility]. He was admitted there about a month ago when he had refractory hypotension here at this hospital.  Of concern, there has been a slight decrement in the patient's chronic renal insufficiency compared to 3 weeks ago when he was last at this hospital; his creatinine has risen from 2.28 to 3.64 during that period of time, although it has dropped slightly overnight from 3.72 to 3.64.   The patient is on lactulose as an outpatient but has not had any obvious encephalopathy during this admission.  He has massive ascites and has undergone multiple large-volume paracenteses in recent months.     Past Medical History:  Diagnosis Date  . Arthritis    "hands, knees, back" (07/05/2016)  . Back pain, lumbosacral 2014  . CHF (congestive  heart failure) ([place])   . Cirrhosis ([place])   . Coronary artery disease    a. Evaluated by Dr. Stanford Breed 2012 - nonobstructive 2007-2008 by cath. b. Abnormal stress test 10/2014 but cath deferred temporarily due to thrombocytopenia. c. 10/2014: readm with AF-RVR/NSTEMI - s/p BMS to mLCx 11/04/14 (mod dz in RCA);  d. relook cath 01/2015 and 06/02/2015 patent stent, nonobs CAD, EF 55-65%.  . Elevated LFTs   . Facial cellulitis 2014   Periorbital  . Fatty liver US - 2012  . Frequent headaches   . GERD (gastroesophageal reflux disease)   . History of blood transfusion 01/2016   "lost alot of blood vomiting"  . Hyperlipidemia   . Morbid obesity ([place])   . Obesity   . OSA on CPAP    severe with AHI 56/hr with successful CPAP titration to 18cm H2O  . PAF (paroxysmal atrial fibrillation) ([place])    a. Episode 10/2014 at time of NSTEMI, spont converted to NSR. Observation for further episodes (not on anticoag at present time due to [facility] = 1, thrombocytopenia).  . Psoriasis   . Sinus bradycardia   . Stroke [facility])    "I've been told I'd had a mini stroke on my left side" (07/05/2016)  . Thrombocytopenia ([place])    a. Onset unclear, but noted 2012. ? 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 s/p prednisone, IVIG.    Past Surgical History:  Procedure Laterality Date  . CARDIAC CATHETERIZATION    . CARDIAC CATHETERIZATION N/A 06/02/2015   Procedure: Left Heart Cath and  Coronary Angiography;  Surgeon: Jettie Booze, MD;  Location: [facility] CV LAB;  Service: Cardiovascular;  Laterality: N/A;  . COLONOSCOPY WITH PROPOFOL N/A 09/26/2016   Procedure: COLONOSCOPY WITH PROPOFOL;  Surgeon: Wilford Corner, MD;  Location: [facility] ENDOSCOPY;  Service: Endoscopy;  Laterality: N/A;  . ESOPHAGOGASTRODUODENOSCOPY N/A 06/22/2016   Procedure: ESOPHAGOGASTRODUODENOSCOPY (EGD);  Surgeon: Clarene Essex, MD;  Location: [facility] ENDOSCOPY;  Service: Endoscopy;  Laterality: N/A;  .  ESOPHAGOGASTRODUODENOSCOPY N/A 11/24/2016   Procedure: ESOPHAGOGASTRODUODENOSCOPY (EGD);  Surgeon: Clarene Essex, MD;  Location: [facility] ENDOSCOPY;  Service: Endoscopy;  Laterality: N/A;  . ESOPHAGOGASTRODUODENOSCOPY (EGD) WITH PROPOFOL N/A 08/17/2016   Procedure: ESOPHAGOGASTRODUODENOSCOPY (EGD) WITH PROPOFOL;  Surgeon: Otis Brace, MD;  Location: [place];  Service: Gastroenterology;  Laterality: N/A;  . FACIAL COSMETIC SURGERY  1990s   "rodeo-related injury"  . IR GENERIC HISTORICAL  09/14/2016   IR RADIOLOGIST EVAL & MGMT 09/14/2016 Sandi Mariscal, MD [facility] INTERV RAD  . IR GENERIC HISTORICAL  12/15/2016   IR RADIOLOGIST EVAL & MGMT 12/15/2016 Sandi Mariscal, MD [facility] INTERV RAD  . IR GENERIC HISTORICAL  02/09/2017   IR PARACENTESIS 02/09/2017 Saverio Danker, PA-C [facility]  . IR PARACENTESIS  03/08/2017  . IR PARACENTESIS  03/14/2017  . IR PARACENTESIS  03/21/2017  . IR PARACENTESIS  03/24/2017  . IR PARACENTESIS  03/30/2017  . IR PARACENTESIS  03/31/2017  . IR PARACENTESIS  04/11/2017  . IR PARACENTESIS  04/18/2017  . KNEE ARTHROPLASTY Left 1970's   "put cartilage in it"  . LAPAROSCOPIC CHOLECYSTECTOMY  2007  . LEFT HEART CATHETERIZATION WITH CORONARY ANGIOGRAM N/A 11/04/2014   Procedure: LEFT HEART CATHETERIZATION WITH CORONARY ANGIOGRAM;  Surgeon: Jettie Booze, MD;  Location: [facility] CATH LAB;  Service: Cardiovascular;  Laterality: N/A;  . LEFT HEART CATHETERIZATION WITH CORONARY ANGIOGRAM N/A 01/28/2015   Procedure: LEFT HEART CATHETERIZATION WITH CORONARY ANGIOGRAM;  Surgeon: Sherren Mocha, MD;  Location[facility] CATH LAB;  Service: Cardiovascular;  Laterality: N/A;  . liver cirrhosis    . RADIOLOGY WITH ANESTHESIA N/A 02/04/2016   Procedure: RADIOLOGY WITH ANESTHESIA;  Surgeon: Medication Radiologist, MD;  Location: [place];  Service: Radiology;  Laterality: N/A;  . TONSILLECTOMY  1960's    Prior to Admission medications   Medication Sig Start Date End Date Taking? Authorizing Provider  buPROPion  (WELLBUTRIN XL) 150 MG 24 hr tablet Take 1 tablet (150 mg total) by mouth every other day. 12/14/16  Yes Orson Eva J, DO  ciprofloxacin (CIPRO) 500 MG tablet Take 500 mg by mouth daily. 03/04/17  Yes [provider]  dibucaine (NUPERCAINAL) 1 % ointment Apply topically 3 (three) times daily as needed for pain. 04/14/17  Yes Ronnie Doss M, DO  docusate sodium (COLACE) 100 MG capsule Take 1 capsule (100 mg total) by mouth 2 (two) times daily. 04/12/17  Yes Bufford Lope, DO  hydrocortisone (ANUSOL-HC) 25 MG suppository Place 25 mg rectally 2 (two) times daily as needed. 04/07/17 05/07/17 Yes [provider]  Lactulose 20 GM/30ML SOLN Take 45 mLs (30 g total) by mouth 2 (two) times daily. Patient taking differently: Take 45 mLs by mouth 4 (four) times daily.  01/06/17  Yes Orson Eva J, DO  midodrine (PROAMATINE) 5 MG tablet Take 15 mg by mouth 3 (three) times daily. 04/07/17 05/07/17 Yes [provider]  Multiple Vitamins-Minerals (MULTIVITAMIN GUMMIES ADULT) CHEW Chew 1 tablet by mouth daily. 12/01/16  Yes Gorsuch, Ni, MD  pantoprazole (PROTONIX) 40 MG tablet Take 1 tablet (40 mg total) by  mouth 2 (two) times daily. 12/14/16  Yes Bufford Lope, DO  pramoxine (PROCTOFOAM) 1 % foam Place 1 application rectally 3 (three) times daily as needed for itching. 04/12/17  Yes Bufford Lope, DO  shark liver oil-cocoa butter (PREPARATION H) 0.25-3-85.5 % suppository Place 1 suppository rectally as needed for hemorrhoids.   Yes [provider]    Current Facility-Administered Medications  Medication Dose Route Frequency Provider Last Rate Last Dose  . 0.9 %  sodium chloride infusion   Intravenous Continuous Rogue Bussing, MD 75 mL/hr at 04/22/17 0342    . cefTRIAXone (ROCEPHIN) 2 g in dextrose 5 % 50 mL IVPB  2 g Intravenous Q24H Franky Macho, RPH      . fentaNYL (SUBLIMAZE) injection 25 mcg  25 mcg Intravenous Q6H PRN Rogue Bussing, MD   25 mcg at 04/22/17  0201  . hydrocortisone (ANUSOL-HC) suppository 25 mg  25 mg Rectal BID PRN Rogue Bussing, MD      . octreotide (SANDOSTATIN) 500 mcg in sodium chloride 0.9 % 250 mL (2 mcg/mL) infusion  50 mcg/hr Intravenous Continuous Deno Etienne, DO 25 mL/hr at 04/22/17 0431 50 mcg/hr at 04/22/17 0431  . pantoprazole (PROTONIX) 80 mg in sodium chloride 0.9 % 250 mL (0.32 mg/mL) infusion  8 mg/hr Intravenous Continuous Deno Etienne, DO 25 mL/hr at 04/22/17 0519 8 mg/hr at 04/22/17 0519  . pramoxine (PROCTOFOAM) 1 % foam 1 application  1 application Rectal TID PRN Rogue Bussing, MD      . promethazine (PHENERGAN) injection 12.5 mg  12.5 mg Intravenous Q6H PRN Rogue Bussing, MD      . sodium chloride flush (NS) 0.9 % injection 3 mL  3 mL Intravenous Q12H Rogue Bussing, MD   3 mL at 04/22/17 0106    Allergies as of 04/21/2017 - Review Complete 04/21/2017  Allergen Reaction Noted  . Asa [aspirin] Other (See Comments) 08/16/2016  . Eliquis [apixaban] Other (See Comments) 08/16/2016  . Nsaids Other (See Comments) and Anaphylaxis 08/16/2016  . Plavix [clopidogrel] Other (See Comments) 08/16/2016  . Statins Other (See Comments) 08/16/2016  . Tolmetin  08/16/2016    Family History  Problem Relation Age of Onset  . Cirrhosis Mother   . Heart attack Mother   . Hypertension Mother   . Hypertension Father   . Stroke Father   . Arthritis Maternal Grandmother   . Heart disease Sister   . Diabetes Sister   . Ovarian cancer Sister   . Heart disease Other        Maternal/paternal grandparents  . Heart attack Maternal Aunt     Social History   Social History  . Marital status: Married    Spouse name: N/A  . Number of children: N/A  . Years of education: N/A   Occupational History  . Not on file.   Social History Main Topics  . Smoking status: Former Smoker    Packs/day: 1.50    Years: 43.00    Types: Cigarettes    Quit date: 02/19/2016  . Smokeless tobacco:  Former Systems developer  . Alcohol use 0.0 oz/week     Comment: 07/05/2016 "mostly stopped in 1979; might have a beer q 6 months or so"  . Drug use: No  . Sexual activity: Not Currently   Other Topics Concern  . Not on file   Social History Narrative   Lives in [place].    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     Review of Systems: no syncope   Physical Exam: Vital signs in last 24 hours: Temp:  [97.9 F (36.6 C)-98.4 F (36.9 C)] 98 F (36.7 C) (06/02 0800) Pulse Rate:  [80-92] 88 (06/02 0800) Resp:  [12-24] 12 (06/02 0800) BP: (70-129)/(45-84) 103/56 (06/02 0800) SpO2:  [93 %-100 %] 100 % (06/02 0800) Weight:  [102.8 kg (226 lb 10.1 oz)] 102.8 kg (226 lb 10.1 oz) (06/02 0300) Last BM Date: 04/21/17 General:  Appears chronically ill but in no distress. Vitals satisfactory.  Head:  Normocephalic and atraumatic. Eyes:  Sclera clear, no icterus.   Lungs:  Clear to auscultation.   No wheezes, crackles, or rhonchi. No evident respiratory distress. Heart:   Regular rate and rhythm; no murmurs, clicks, rubs,  or gallops. Abdomen:  severely distended with ascites, equivalent to approximately 8 or 9 months of pregnancy. Moderately exquisite diffuse tenderness.  Rectal:   not performed, had a bowel movement with tan stool with small amount of blood noted  Msk:   Symmetrical without gross deformities. Extremities:   No tibial edema. Neurologic:  Alert and coherent; oriented to date. Grossly normal neurologically. there may be some very fine asterixis present, nothing florid.  Psych:   Alert and cooperative. Normal mood and affect.  Intake/Output from previous day: 06/01 0701 - 06/02 0700 In: 1975.4 [I.V.:775.4; IV Piggyback:1200] Out: 0  Intake/Output this shift: Total I/O In: -  Out: 1 [Stool:1]  Lab Results:  Recent Labs  04/21/17 1658 04/22/17 0500  WBC 8.8 6.6  HGB 9.3* 8.3*  HCT 27.2* 24.5*  PLT  67* 38*   BMET  Recent Labs  04/21/17 1658 04/22/17 0500 04/22/17 0728  NA 130* 131*  --   K 5.9* 6.4* 6.0*  CL 107 108  --   CO2 13* 14*  --   GLUCOSE 133* 102*  --   BUN 56* 59*  --   CREATININE 3.72* 3.64*  --   CALCIUM 9.1 8.6*  --    LFT  Recent Labs  04/22/17 0500  PROT 5.8*  ALBUMIN 3.1*  AST 52*  ALT 37  ALKPHOS 75  BILITOT 3.3*   PT/INR  Recent Labs  04/21/17 1910 04/22/17 0500  LABPROT 18.4* 19.4*  INR 1.51 1.62    Studies/Results: Ct Abdomen Pelvis Wo Contrast  Result Date: 04/21/2017 CLINICAL DATA:  Acute onset right lower quadrant abdominal pain, rectal bleeding and hematemesis. Initial encounter. EXAM: CT ABDOMEN AND PELVIS WITHOUT CONTRAST TECHNIQUE: Multidetector CT imaging of the abdomen and pelvis was performed following the standard protocol without IV contrast. COMPARISON:  CT of the abdomen and pelvis performed 03/29/2017 FINDINGS: Lower chest: Diffuse coronary artery calcifications are seen. Mild bibasilar atelectasis is noted. Hepatobiliary: There is a diffusely nodular contour to the liver, compatible with hepatic cirrhosis. The patient is status post cholecystectomy, with clips noted at the gallbladder fossa. The common bile duct is grossly unremarkable in appearance. Pancreas: The pancreas is within normal limits. Spleen: The spleen is enlarged, measuring 15.8 cm in length. Scattered splenic and gastric varices are seen. Adrenals/Urinary Tract: The adrenal glands are difficult to fully assess but appear grossly unremarkable. Nonobstructing right renal stones measure up to 7 mm in size. The kidneys are otherwise unremarkable. There is no hydronephrosis. No obstructing ureteral stones are identified. No perinephric stranding is seen. Stomach/Bowel: The stomach is unremarkable in appearance. The small bowel is within  normal limits. The appendix is not visualized; there is no evidence for appendicitis. The colon is unremarkable in appearance.  Vascular/Lymphatic: Scattered calcification is seen along the abdominal aorta and its branches. The abdominal aorta is otherwise grossly unremarkable. The inferior vena cava is grossly unremarkable. No retroperitoneal lymphadenopathy is seen. No pelvic sidewall lymphadenopathy is identified. Reproductive: The bladder is mildly distended and grossly unremarkable. The prostate remains normal in size. Other: Large volume ascites is noted throughout the abdomen and pelvis. Fluid extends into a tiny right inguinal hernia. Musculoskeletal: No acute osseous abnormalities are identified. Mild facet disease is noted at the lower lumbar spine. The visualized musculature is unremarkable in appearance. IMPRESSION: 1. Large volume ascites noted throughout the abdomen and pelvis. Fluid extends into a tiny right inguinal hernia. 2. Findings of hepatic cirrhosis. Scattered splenic and gastric varices noted. 3. Splenomegaly. 4. Scattered aortic atherosclerosis. 5. Diffuse coronary artery calcification noted. 6. Nonobstructing right renal stones measure up to 7 mm size. Electronically Signed   By: Garald Balding M.D.   On: 04/21/2017 21:33    Impression: 1. Upper GI bleed, currently quiescent. Suspect portal gastropathy, less likely varices (given small size of varices 5 months ago, with additional banding at that time)  2. Posthemorrhagic anemia, moderate  3. Severe ascites with abdominal tenderness, raising question of SBP  4. Acute on chronic renal insufficiency   Plan: 1. Agree with current management to include octreotide and antibiotics 2. Consider endoscopy later today for clarification of the source of bleeding. However, with the patient's hyperkalemia and the apparent cessation of bleeding, I do not feel that endoscopy is critical to do at the present time. Instead, we will see if the patient's potassium comes down and consider endoscopic evaluation either later today or possibly tomorrow. 3. Agree with plan  for renal consultation 4. Would consider transferring to the liver service at [facility] if the patient shows evidence of progressive renal insufficiency, or if he should develop refractory bleeding.   LOS: 1 day   Nickalus Thornsberry V  04/22/2017, 10:58 AM   Pager [phone] If no answer or after 5 PM call [phone]

## 2017-04-22 NOTE — Progress Notes (Addendum)
Pharmacy Antibiotic Note  Vincent Ochoa is a 61 y.o. male admitted on 04/21/2017 with hematemesis - pt know to have varices.  Pharmacy has been consulted for Rocephin dosing for SBP.  Plan: Rocephin 1gm IV now to make initial dose = 2gm then 2gm IV daily Pharmacy will sign off - please reconsult if needed     Temp (24hrs), Avg:98.4 F (36.9 C), Min:98.4 F (36.9 C), Max:98.4 F (36.9 C)   Recent Labs Lab 04/21/17 1658  WBC 8.8  CREATININE 3.72*    Estimated Creatinine Clearance: 25.6 mL/min (A) (by C-G formula based on SCr of 3.72 mg/dL (H)).    Allergies  Allergen Reactions  . Asa [Aspirin] Other (See Comments)    Other reaction(s): Other (See Comments) Previous hemmorhage Bleeding Previous hemmorhage  . Eliquis [Apixaban] Other (See Comments)    bleeding Potentiality of internal bleeding  . Nsaids Other (See Comments) and Anaphylaxis    May cause bleeding issues (per wife) May only have 1 plain Tylenol every 8 hours is needed for flu symptoms, per doctor.  . Plavix [Clopidogrel] Other (See Comments)    Bleeding Potentiality of internal bleeding  . Statins Other (See Comments)    Other reaction(s): Other (See Comments) Potentially may make patient bleed internally (per wife) Potentially may make patient bleed internally (per wife)  . Tolmetin     Other reaction(s): Other (See Comments) May cause bleeding issues (per wife) May only have 1 plain Tylenol every 8 hours is needed for flu symptoms, per doctor.    Thank you for allowing pharmacy to be a part of this patient's care.  Sherlon Handing, PharmD, BCPS Clinical pharmacist, pager [phone] 04/22/2017 12:21 AM

## 2017-04-22 NOTE — Anesthesia Preprocedure Evaluation (Addendum)
Anesthesia Evaluation    Airway Mallampati: I  TM Distance: >3 FB Neck ROM: Full    Dental  (+) Dental Advisory Given, Edentulous Upper, Poor Dentition, Partial Lower   Pulmonary sleep apnea and Continuous Positive Airway Pressure Ventilation , former smoker,    Pulmonary exam normal breath sounds clear to auscultation- rhonchi (-) decreased breath sounds      Cardiovascular hypertension, Pt. on medications + CAD, + Past MI, + Cardiac Stents and +CHF  Normal cardiovascular exam+ dysrhythmias Atrial Fibrillation  Rhythm:Regular Rate:Normal  BMS 2015, not on aspirin due to recurrent GI bleeds   Neuro/Psych  Headaches, PSYCHIATRIC DISORDERS Depression CVA    GI/Hepatic GERD  ,(+) Cirrhosis   Esophageal Varices and ascites    ,  hematemesis, cirrhosis   Endo/Other    Renal/GU Renal Insufficiency and ARFRenal disease     Musculoskeletal  (+) Arthritis , Osteoarthritis,    Abdominal   Peds  Hematology  (+) Blood dyscrasia (Thrombocytopenia), anemia ,   Anesthesia Other Findings   Reproductive/Obstetrics                            Anesthesia Physical Anesthesia Plan  ASA: IV  Anesthesia Plan: MAC   Post-op Pain Management:    Induction: Intravenous  Airway Management Planned: Nasal Cannula  Additional Equipment:   Intra-op Plan:   Post-operative Plan:   Informed Consent: I have reviewed the patients History and Physical, chart, labs and discussed the procedure including the risks, benefits and alternatives for the proposed anesthesia with the patient or authorized representative who has indicated his/her understanding and acceptance.   Dental advisory given  Plan Discussed with: CRNA and Anesthesiologist  Anesthesia Plan Comments: (Discussed risks/benefits/alternatives to MAC sedation including need for ventilatory support, hypotension, need for conversion to general anesthesia.  All  patient questions answered.  Patient/guardian wishes to proceed.)        Anesthesia Quick Evaluation

## 2017-04-22 NOTE — Plan of Care (Signed)
Problem: Education: Goal: Knowledge of [facility] Education information/materials will improve Outcome: Progressing Discussed plan of care for the night and possible EGD tomorrow with some teach back displayed.

## 2017-04-22 NOTE — Op Note (Addendum)
[facility] Patient Name: Vincent Ochoa Procedure Date : 04/22/2017 MRN: [id number] Attending MD: Ronald Lobo , MD Date of Birth: 1956/08/01 CSN: [id number] Age: 61 Admit Type: Inpatient Procedure:                Upper GI endoscopy Indications:              Hematemesis yesterday, with hematochezia but no                            syncope, in a cryptogenic cirrhotic patient with                            known (small varices) and portal hypertensive                            gastropathy. Providers:                Ronald Lobo, MD, Elmer Ramp. Tilden Dome, RN, William Dalton, Technician Referring MD:              Medicines:                Monitored Anesthesia Care Complications:            No immediate complications. Estimated Blood Loss:     Estimated blood loss was minimal. Procedure:                Pre-Anesthesia Assessment:                           - Prior to the procedure, a History and Physical                            was performed, and patient medications and                            allergies were reviewed. The patient's tolerance of                            previous anesthesia was also reviewed. The risks                            and benefits of the procedure and the sedation                            options and risks were discussed with the patient.                            All questions were answered, and informed consent                            was obtained. Prior Anticoagulants: The patient has                            taken no  previous anticoagulant or antiplatelet                            agents. ASA Grade Assessment: III - A patient with                            severe systemic disease. After reviewing the risks                            and benefits, the patient was deemed in                            satisfactory condition to undergo the procedure.                           After obtaining informed  consent, the endoscope was                            passed under direct vision. Throughout the                            procedure, the patient's blood pressure, pulse, and                            oxygen saturations were monitored continuously. The                            EG-[id number] ([id number]) scope was introduced through the                            mouth, and advanced to the second part of duodenum.                            The upper GI endoscopy was accomplished without                            difficulty. The patient tolerated the procedure                            well. Findings:      The larynx was normal.      A single Grade II varix was found at the gastroesophageal junction. It       was 6 mm in largest diameter. It had a small red spot but no definite       stigma of recent hemorrhage. One band was successfully placed with       complete eradication, resulting in deflation of varices. There was no       bleeding during the procedure. Attempts to place bands on the other       column of minimal varices were unsuccessful, as not enough tissue could       be suctioned up into the bander to successfully apply a band.      The exam of the esophagus was otherwise normal.      No blood or coffee ground material was present in the stomach.  A small amount of food (residue) was found in the gastric fundus and       antrum.      Moderate portal hypertensive gastropathy was found in the entire       examined stomach.      The exam of the stomach was otherwise normal.      The cardia and gastric fundus were normal on retroflexion.      There is no endoscopic evidence of varices in the cardia.      The examined duodenum was normal although there was some minimal contact       hemorrhage from the scope. Impression:               - No active bleeding or blood in the stomach at the                            time of this procedure.                           - Normal  larynx.                           - Solitary Grade II esophageal varix. Completely                            eradicated. Banded. Possible Grade I varices,                            unable to band.                           - A small amount of food (residue) in the stomach.                           - Portal hypertensive gastropathy.                           - Normal examined duodenum.                           - No specimens collected.                           - Source of patient's hematemesis yesterday remains                            unclear. Moderate Sedation:      This patient was sedated with monitored anesthesia care, not moderate       sedation. Recommendation:           - Observe patient's clinical course following                            today's procedure with therapeutic intervention.                           - Clear liquid diet today.                           -  Continue present medications. Procedure Code(s):        --- Professional ---                           [id number], Esophagogastroduodenoscopy, flexible,                            transoral; with band ligation of esophageal/gastric                            varices Diagnosis Code(s):        --- Professional ---                           I85.00, Esophageal varices without bleeding                           K76.6, Portal hypertension                           K92.0, Hematemesis CPT copyright 2016 American [employer] Association. All rights reserved. The codes documented in this report are preliminary and upon coder review may  be revised to meet current compliance requirements. Ronald Lobo, MD 04/22/2017 3:02:56 PM This report has been signed electronically. Number of Addenda: 0

## 2017-04-22 NOTE — Anesthesia Postprocedure Evaluation (Signed)
Anesthesia Post Note  Patient: Vincent Ochoa  Procedure(s) Performed: Procedure(s) (LRB): ESOPHAGOGASTRODUODENOSCOPY (EGD) WITH PROPOFOL (N/A)     Patient location during evaluation: PACU Anesthesia Type: MAC Level of consciousness: awake and alert Pain management: pain level controlled Vital Signs Assessment: post-procedure vital signs reviewed and stable Respiratory status: spontaneous breathing, nonlabored ventilation, respiratory function stable and patient connected to nasal cannula oxygen Cardiovascular status: stable and blood pressure returned to baseline Anesthetic complications: no    Last Vitals:  Vitals:   04/22/17 1505 04/22/17 1517  BP: (!) 112/58 (!) 113/55  Pulse: 84 83  Resp: 12 12  Temp:      Last Pain:  Vitals:   04/22/17 1413  TempSrc: Oral  PainSc:                  [name]

## 2017-04-23 ENCOUNTER — Encounter (HOSPITAL_COMMUNITY): Payer: Self-pay | Admitting: Gastroenterology

## 2017-04-23 LAB — CBC
HCT: 21 % — ABNORMAL LOW (ref 39.0–52.0)
Hemoglobin: 7.1 g/dL — ABNORMAL LOW (ref 13.0–17.0)
MCH: 30.7 pg (ref 26.0–34.0)
MCHC: 33.8 g/dL (ref 30.0–36.0)
MCV: 90.9 fL (ref 78.0–100.0)
PLATELETS: 44 10*3/uL — AB (ref 150–400)
RBC: 2.31 MIL/uL — ABNORMAL LOW (ref 4.22–5.81)
RDW: 15.2 % (ref 11.5–15.5)
WBC: 4.5 10*3/uL (ref 4.0–10.5)

## 2017-04-23 LAB — COMPREHENSIVE METABOLIC PANEL
ALBUMIN: 4 g/dL (ref 3.5–5.0)
ALT: 27 U/L (ref 17–63)
ANION GAP: 8 (ref 5–15)
AST: 42 U/L — AB (ref 15–41)
Alkaline Phosphatase: 63 U/L (ref 38–126)
BUN: 62 mg/dL — AB (ref 6–20)
CHLORIDE: 108 mmol/L (ref 101–111)
CO2: 14 mmol/L — ABNORMAL LOW (ref 22–32)
Calcium: 8.8 mg/dL — ABNORMAL LOW (ref 8.9–10.3)
Creatinine, Ser: 3.55 mg/dL — ABNORMAL HIGH (ref 0.61–1.24)
GFR calc Af Amer: 20 mL/min — ABNORMAL LOW (ref >60)
GFR, EST NON AFRICAN AMERICAN: 17 mL/min — AB (ref >60)
GLUCOSE: 95 mg/dL (ref 65–99)
Potassium: 5.2 mmol/L — ABNORMAL HIGH (ref 3.5–5.1)
Sodium: 130 mmol/L — ABNORMAL LOW (ref 135–145)
TOTAL PROTEIN: 5.9 g/dL — AB (ref 6.5–8.1)
Total Bilirubin: 3.4 mg/dL — ABNORMAL HIGH (ref 0.3–1.2)

## 2017-04-23 LAB — PREPARE RBC (CROSSMATCH)

## 2017-04-23 LAB — POTASSIUM
POTASSIUM: 5.2 mmol/L — AB (ref 3.5–5.1)
POTASSIUM: 4.8 mmol/L (ref 3.5–5.1)

## 2017-04-23 LAB — CULTURE, BODY FLUID W GRAM STAIN -BOTTLE: Culture: NO GROWTH

## 2017-04-23 LAB — CULTURE, BODY FLUID-BOTTLE

## 2017-04-23 LAB — HEMOGLOBIN AND HEMATOCRIT, BLOOD
HCT: 18.4 % — ABNORMAL LOW (ref 39.0–52.0)
Hemoglobin: 6.3 g/dL — CL (ref 13.0–17.0)

## 2017-04-23 LAB — GLUCOSE, CAPILLARY: Glucose-Capillary: 95 mg/dL (ref 65–99)

## 2017-04-23 MED ORDER — CIPROFLOXACIN HCL 500 MG PO TABS
500.0000 mg | ORAL_TABLET | Freq: Every day | ORAL | Status: DC
  Administered 2017-04-23 – 2017-04-27 (×5): 500 mg via ORAL
  Filled 2017-04-23 (×5): qty 1

## 2017-04-23 MED ORDER — SACCHAROMYCES BOULARDII 250 MG PO CAPS
250.0000 mg | ORAL_CAPSULE | Freq: Two times a day (BID) | ORAL | Status: DC
  Administered 2017-04-24 – 2017-04-27 (×9): 250 mg via ORAL
  Filled 2017-04-23 (×9): qty 1

## 2017-04-23 MED ORDER — SODIUM BICARBONATE 650 MG PO TABS
1300.0000 mg | ORAL_TABLET | Freq: Three times a day (TID) | ORAL | Status: DC
  Administered 2017-04-23 – 2017-04-25 (×6): 1300 mg via ORAL
  Filled 2017-04-23 (×6): qty 2

## 2017-04-23 MED ORDER — SODIUM CHLORIDE 0.9 % IV SOLN
Freq: Once | INTRAVENOUS | Status: AC
  Administered 2017-04-23: 16:00:00 via INTRAVENOUS

## 2017-04-23 MED ORDER — ALBUMIN HUMAN 25 % IV SOLN
100.0000 g | Freq: Once | INTRAVENOUS | Status: AC
  Administered 2017-04-23: 100 g via INTRAVENOUS
  Filled 2017-04-23: qty 400

## 2017-04-23 NOTE — Progress Notes (Signed)
NURSING PROGRESS NOTE  Vincent Ochoa [id number] Transfer Data: 04/23/2017 4:56 PM Attending Provider: No att. providers found PCP:Yoo, Gabriel Rainwater, DO Code Status: Full   Vincent Ochoa is a 61 y.o. male patient transferred from [place]  -No acute distress noted Is c/o of abdominal pain with abdominal distension noted.  -No complaints of shortness of breath.  -No complaints of chest pain.   Cardiac Monitoring: Box # 25 in place. Cardiac monitor yields:NSR.  Blood pressure 111/61, pulse 88, temperature 98.3 F (36.8 C), temperature source Oral, resp. rate 18, height 6\' 3"  (1.905 m), weight 102.8 kg (226 lb 10.1 oz), SpO2 95 %.   IV Fluids:  IV in place, occlusive dsg intact without redness, IV cath Meds; Protonix @ 25 cc/hr & Octreotide 25 cc/hr gtts.   Allergies:  Asa [aspirin]; Eliquis [apixaban]; Nsaids; Plavix [clopidogrel]; Statins; and Tolmetin  Past Medical History:   has a past medical history of Arthritis; Back pain, lumbosacral (2014); CHF (congestive heart failure) ([place]); Cirrhosis ([place]); Coronary artery disease; Elevated LFTs; Facial cellulitis (2014); Fatty liver ([place] - 2012); Frequent headaches; GERD (gastroesophageal reflux disease); History of blood transfusion (01/2016); Hyperlipidemia; Morbid obesity ([place]); Obesity; OSA on CPAP; PAF (paroxysmal atrial fibrillation) ([place]); Psoriasis; Sinus bradycardia; Stroke [facility]); and Thrombocytopenia ([place]).  Past Surgical History:   has a past surgical history that includes Tonsillectomy (1960's); Knee Arthroplasty (Left, 1970's); Facial cosmetic surgery (1990s); Cardiac catheterization; left heart catheterization with coronary angiogram (N/A, 11/04/2014); left heart catheterization with coronary angiogram (N/A, 01/28/2015); Cardiac catheterization (N/A, 06/02/2015); Radiology with anesthesia (N/A, 02/04/2016); Esophagogastroduodenoscopy (N/A, 06/22/2016); Laparoscopic cholecystectomy (2007); Esophagogastroduodenoscopy (egd) with propofol (N/A,  08/17/2016); liver cirrhosis; Colonoscopy with propofol (N/A, 09/26/2016); ir generic historical (09/14/2016); Esophagogastroduodenoscopy (N/A, 11/24/2016); ir generic historical (12/15/2016); ir generic historical (02/09/2017); IR Paracentesis (03/08/2017); IR Paracentesis (03/14/2017); IR Paracentesis (03/21/2017); IR Paracentesis (03/24/2017); IR Paracentesis (03/30/2017); IR Paracentesis (03/31/2017); IR Paracentesis (04/11/2017); and IR Paracentesis (04/18/2017).  Social History:   reports that he quit smoking about 14 months ago. His smoking use included Cigarettes. He has a 64.50 pack-year smoking history. He has quit using smokeless tobacco. He reports that he drinks alcohol. He reports that he does not use drugs.  Skin: Intact with a skin tear noted on the left upper arm with trans-parent dressing to area.  Patient/Family orientated to room. Information packet given to patient/family. Admission inpatient armband information verified with patient/family to include name and date of birth and placed on patient arm. Side rails up x 2, fall assessment and education completed with patient/family. Patient/family able to verbalize understanding of risk associated with falls and verbalized understanding to call for assistance before getting out of bed. Call light within reach. Patient/family able to voice and demonstrate understanding of unit orientation instructions.    Will continue to evaluate and treat per MD orders.

## 2017-04-23 NOTE — Progress Notes (Signed)
Family Medicine Teaching Service Daily Progress Note Intern Pager: [phone]  Patient name: Vincent Ochoa Medical record number: [id number] Date of birth: October 08, 1956 Age: 61 y.o. Gender: male  Primary Care Provider: Bufford Lope, DO Consultants: GI, renal Code Status: Full  Pt Overview and Major Events to Date:  6/1 - Admitted for hematemesis  Assessment and Plan: Vincent Ochoa is a 61 y.o. male presenting with bright red vomitus and abdominal pain. PMH is significant for cirrhosis of liver with recurrent ascites (followed by [facility] hepatology), CAD (stent), psoriasis, thrombocytopenia (followed by [facility]), OSA, knee pain, obesity, GERD, Depression, PAF, HTN, esophageal varices, anemia of chronic disease  Anemia in setting of hematemesis/hematochezia. No hematemesis since admission. Ongoing hematochezia per patient since colonoscopy 2 weeks ago. Hgb7.1 today, down from 9.3 (BL ~ 9.0). Maintaining blood pressure. patinet has known anemia of chronic disease with recent aggrevation from GI bleed. Concerning due to history of variceal bleeds in setting of end-stage cirrhosis with HRS. 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 S/p EGD yesterday by GI without active bleeding.  - GI consulted; appreciate recs  - Continue octreotide and PO cipro for infection prophylaxis  - IV protonix - 2 large bore IVs placed - IV phenergan 12.5 mg q6h -plan for blood transfusion of 2U today -monitor Hbg  Abdominal pain. Chronic recurrent ascites 2/2 liver failure. CT abdomen pelvis without evidence of bowel perforation. Large volume ascites noted. No leukocytosis or fever. Lipase elevated at 105 but in setting of chronic liver disease and worsening kidney function. No growth to date from peritoneal fluid culture and negative gram stain from 04/18/17.  - Monitor pain - IV fentanyl 25 mg q6h prn - Having weekly paracenteses; consult IR tomorrow morning for more emergent  paracentesis in setting of worsening distension and pain   AKI. SCr elevated to 3.72 on from new BL ~ 2.0. Mildly improved at 3.5 today. Suspect this indicates worsening of hepatorenal syndrome. Dehydration in setting of vomiting and nausea may also be contributing. - CMP daily - Renal consulted, appreciate recommendations -no acute need for HD   Hyperkalemia.  K 5.2 this AM. Denies chest pain or palpitations. Secondary to acute kidney injury and GI bleed/increased uptake. In setting of renal failure and liver failure that could be causing relative adrenal insufficiency. No EKG changes x 2.  - Continue to monitor -continue lactulose/kayexalate   Liver Cirrhosis. MELD score 31 (52.6% mortality risk in next 3 months). Followed by [facility] Hepatology and planning for possible liver transplant. - Contact [facility] in a.m. with FYI of admission - daily cipro for SBP prophylaxis - lactulose 30 g BID - midodrine 15 mg TID  Hemorrhoids. More comfortable today. Chronic, worsened by recent colonoscopy, per patient - Proctofoam and anusol suppository ordered prn for comfort  GERD:Stable. At home on Protonix 40 mg po BID and sucralfate 1g PO TID.  - IV PPI  XNA:TFTDDU. Echo 11/23/2016 with EF 50-55%. No signs of edema or crackles. Lasix and spiro discontinued at last admission to [facility] 4/10-4/14.  - Monitor for signs of fluid overload on exam  CAD:S/p Left heart cath July 2016 showing nonobstructive coronary artery disease. Mid cx lesion previously treated with a bare metal stent in 10/2014. Follows with Dr. Burt Ochoa outpatient.   Thrombocytopenia:Stable. On admission, plt count of 38. At prior admission on 12/21, plt count of 47. At home on Promacta 100 mg po daily.  - Monitor  KGU:RKYHCW. To be using CPAP but awaiting equipment.   -  CPAP qhs  Depression:Stable. At home on Wellbutrin.  - wellbutrin 150 mg po daily   PAF:On problem list. Not on pharmacologic treatment  currently given history of bleeds.   FEN/GI: heart healthy/carb mod  Prophylaxis: SCDs  Disposition: Pending improvement and stable labs  Subjective:  Patient states that he is sleepy this morning. Endorsing some abdominal pain. He has not had further hematemesis. Does report stool with blood.   Objective: Temp:  [97.4 F (36.3 C)-98.6 F (37 C)] 98 F (36.7 C) (06/03 0751) Pulse Rate:  [75-101] 90 (06/03 0751) Resp:  [12-20] 13 (06/03 0751) BP: (87-128)/(49-111) 100/53 (06/03 0751) SpO2:  [81 %-100 %] 81 % (06/03 0751) Physical Exam: General: Chronically ill appearing man, jaundiced, NAD Eyes: subconjunctival hemorrhage of left eye Cardiovascular: RRR, S1, S2, no m/r/g Respiratory: CTAB, normal WOB Abdomen: distended, + fluid wave, tense but still soft, TTP throughout Extremities: Trace LE edema Neuro: AOx3  Laboratory:  Recent Labs Lab 04/21/17 1658 04/22/17 0500 04/23/17 0613  WBC 8.8 6.6 4.5  HGB 9.3* 8.3* 7.1*  HCT 27.2* 24.5* 21.0*  PLT 67* 38* 44*    Recent Labs Lab 04/21/17 1658 04/22/17 0500  04/22/17 1350 04/22/17 1834 04/22/17 2220 04/23/17 0613  NA 130* 131*  --   --   --   --  130*  K 5.9* 6.4*  < > 5.4* 5.8* 5.3* 5.2*  5.2*  CL 107 108  --   --   --   --  108  CO2 13* 14*  --   --   --   --  14*  BUN 56* 59*  --   --   --   --  62*  CREATININE 3.72* 3.64*  --   --   --   --  3.55*  CALCIUM 9.1 8.6*  --   --   --   --  8.8*  PROT 6.6 5.8*  --   --   --   --  5.9*  BILITOT 2.9* 3.3*  --   --   --   --  3.4*  ALKPHOS 85 75  --   --   --   --  63  ALT 38 37  --   --   --   --  27  AST 62* 52*  --   --   --   --  42*  GLUCOSE 133* 102*  --  105*  --   --  95  < > = values in this interval not displayed.  Imaging/Diagnostic Tests: Ct Abdomen Pelvis Wo Contrast  Result Date: 04/21/2017 CLINICAL DATA:  Acute onset right lower quadrant abdominal pain, rectal bleeding and hematemesis. Initial encounter. EXAM: CT ABDOMEN AND PELVIS WITHOUT  CONTRAST TECHNIQUE: Multidetector CT imaging of the abdomen and pelvis was performed following the standard protocol without IV contrast. COMPARISON:  CT of the abdomen and pelvis performed 03/29/2017 FINDINGS: Lower chest: Diffuse coronary artery calcifications are seen. Mild bibasilar atelectasis is noted. Hepatobiliary: There is a diffusely nodular contour to the liver, compatible with hepatic cirrhosis. The patient is status post cholecystectomy, with clips noted at the gallbladder fossa. The common bile duct is grossly unremarkable in appearance. Pancreas: The pancreas is within normal limits. Spleen: The spleen is enlarged, measuring 15.8 cm in length. Scattered splenic and gastric varices are seen. Adrenals/Urinary Tract: The adrenal glands are difficult to fully assess but appear grossly unremarkable. Nonobstructing right renal stones measure up to 7 mm in size. The kidneys  are otherwise unremarkable. There is no hydronephrosis. No obstructing ureteral stones are identified. No perinephric stranding is seen. Stomach/Bowel: The stomach is unremarkable in appearance. The small bowel is within normal limits. The appendix is not visualized; there is no evidence for appendicitis. The colon is unremarkable in appearance. Vascular/Lymphatic: Scattered calcification is seen along the abdominal aorta and its branches. The abdominal aorta is otherwise grossly unremarkable. The inferior vena cava is grossly unremarkable. No retroperitoneal lymphadenopathy is seen. No pelvic sidewall lymphadenopathy is identified. Reproductive: The bladder is mildly distended and grossly unremarkable. The prostate remains normal in size. Other: Large volume ascites is noted throughout the abdomen and pelvis. Fluid extends into a tiny right inguinal hernia. Musculoskeletal: No acute osseous abnormalities are identified. Mild facet disease is noted at the lower lumbar spine. The visualized musculature is unremarkable in appearance.  IMPRESSION: 1. Large volume ascites noted throughout the abdomen and pelvis. Fluid extends into a tiny right inguinal hernia. 2. Findings of hepatic cirrhosis. Scattered splenic and gastric varices noted. 3. Splenomegaly. 4. Scattered aortic atherosclerosis. 5. Diffuse coronary artery calcification noted. 6. Nonobstructing right renal stones measure up to 7 mm size. Electronically Signed   By: Garald Balding M.D.   On: 04/21/2017 21:33   Katheren Shams, DO 04/23/2017, 8:09 AM PGY-3, [place] Intern pager: [phone], text pages welcome

## 2017-04-23 NOTE — Progress Notes (Signed)
Patient ID: Vincent Ochoa, male   DOB: December 06, 1955, 61 y.o.   MRN: [id number]  [facility] Progress Note   Assessment/ Plan:   1. Acute kidney injury on chronic kidney disease stage III: Labs pointing towards HRS versus prerenal azotemia-getting albumin for the latter to try and increase intravascular volume/renal perfusion. He has been having some urine output along with bowel movements and possibly incomplete collection/charted. No significant change in renal function overnight. Continue supportive management with octreotide/midodrine. No acute indications for hemodialysis. Plans noted for paracentesis tomorrow-if he has >6 L paracentesis, recommend 50 g albumin infusion (25%) to limit renal hypoperfusion/injury. 2. Hyperkalemia: Secondary to acute kidney injury and GI bleed/increased uptake. Potassium levels improving with increased bowel movements from lactulose/Kayexalate 3. Upper GI bleed: Seen by Dr. Cristina Gong yesterday who undertook EGD with banding of a solitary grade 2 esophageal varix-evidence of portal gastropathy noted. No evidence of active bleeding noted. 4. Anemia: Likely anemia of chronic disease and recently aggravated by GI bleed. Plans noted in place for PRBC transfusion today. 5. Anion gap metabolic acidosis: Most likely secondary to acute kidney injury/end-stage liver disease-increase dose of sodium bicarbonate with cautious monitoring to avoid alkalemia.  Subjective:   Reports abdominal distention and pain. Reports minimal shortness of breath because of inability to draw a complete breath.    Objective:   BP (!) 102/58 (BP Location: Left Arm)   Pulse 89   Temp 98 F (36.7 C) (Oral)   Resp 13   Ht 6\' 3"  (1.905 m)   Wt 102.8 kg (226 lb 10.1 oz)   SpO2 96%   BMI 28.33 kg/m   Intake/Output Summary (Last 24 hours) at 04/23/17 1006 Last data filed at 04/23/17 0600  Gross per 24 hour  Intake             3280 ml  Output              300 ml  Net              2980 ml   Weight change:   Physical Exam: GUY:[id number] to be uncomfortable resting in bed CVS: Pulse regular rhythm, normal rate, ejection systolic murmur Resp: Diminished breath sounds at bases, clear anteriorly Abd: Soft, moderate distention with tenderness and midepigastric/flank areas. Ext:1-2+ edema over legs  Imaging: Ct Abdomen Pelvis Wo Contrast  Result Date: 04/21/2017 CLINICAL DATA:  Acute onset right lower quadrant abdominal pain, rectal bleeding and hematemesis. Initial encounter. EXAM: CT ABDOMEN AND PELVIS WITHOUT CONTRAST TECHNIQUE: Multidetector CT imaging of the abdomen and pelvis was performed following the standard protocol without IV contrast. COMPARISON:  CT of the abdomen and pelvis performed 03/29/2017 FINDINGS: Lower chest: Diffuse coronary artery calcifications are seen. Mild bibasilar atelectasis is noted. Hepatobiliary: There is a diffusely nodular contour to the liver, compatible with hepatic cirrhosis. The patient is status post cholecystectomy, with clips noted at the gallbladder fossa. The common bile duct is grossly unremarkable in appearance. Pancreas: The pancreas is within normal limits. Spleen: The spleen is enlarged, measuring 15.8 cm in length. Scattered splenic and gastric varices are seen. Adrenals/Urinary Tract: The adrenal glands are difficult to fully assess but appear grossly unremarkable. Nonobstructing right renal stones measure up to 7 mm in size. The kidneys are otherwise unremarkable. There is no hydronephrosis. No obstructing ureteral stones are identified. No perinephric stranding is seen. Stomach/Bowel: The stomach is unremarkable in appearance. The small bowel is within normal limits. The appendix is not visualized; there is no evidence  for appendicitis. The colon is unremarkable in appearance. Vascular/Lymphatic: Scattered calcification is seen along the abdominal aorta and its branches. The abdominal aorta is otherwise grossly unremarkable. The  inferior vena cava is grossly unremarkable. No retroperitoneal lymphadenopathy is seen. No pelvic sidewall lymphadenopathy is identified. Reproductive: The bladder is mildly distended and grossly unremarkable. The prostate remains normal in size. Other: Large volume ascites is noted throughout the abdomen and pelvis. Fluid extends into a tiny right inguinal hernia. Musculoskeletal: No acute osseous abnormalities are identified. Mild facet disease is noted at the lower lumbar spine. The visualized musculature is unremarkable in appearance. IMPRESSION: 1. Large volume ascites noted throughout the abdomen and pelvis. Fluid extends into a tiny right inguinal hernia. 2. Findings of hepatic cirrhosis. Scattered splenic and gastric varices noted. 3. Splenomegaly. 4. Scattered aortic atherosclerosis. 5. Diffuse coronary artery calcification noted. 6. Nonobstructing right renal stones measure up to 7 mm size. Electronically Signed   By: Garald Balding M.D.   On: 04/21/2017 21:33    Labs: BMET  Recent Labs Lab 04/21/17 1658 04/22/17 0500 04/22/17 0728 04/22/17 1119 04/22/17 1350 04/22/17 1834 04/22/17 2220 04/23/17 0613  NA 130* 131*  --   --   --   --   --  130*  K 5.9* 6.4* 6.0* 5.8* 5.4* 5.8* 5.3* 5.2*  5.2*  CL 107 108  --   --   --   --   --  108  CO2 13* 14*  --   --   --   --   --  14*  GLUCOSE 133* 102*  --   --  105*  --   --  95  BUN 56* 59*  --   --   --   --   --  62*  CREATININE 3.72* 3.64*  --   --   --   --   --  3.55*  CALCIUM 9.1 8.6*  --   --   --   --   --  8.8*   CBC  Recent Labs Lab 04/21/17 1658 04/22/17 0500 04/23/17 0613  WBC 8.8 6.6 4.5  HGB 9.3* 8.3* 7.1*  HCT 27.2* 24.5* 21.0*  MCV 89.8 91.4 90.9  PLT 67* 38* 44*   Medications:    . buPROPion  150 mg Oral QODAY  . ciprofloxacin  500 mg Oral Daily  . docusate sodium  100 mg Oral BID  . lactulose  30 g Oral QID  . midodrine  10 mg Oral TID WC  . multivitamin with minerals  1 tablet Oral Daily  . sodium  bicarbonate  650 mg Oral TID  . sodium chloride flush  3 mL Intravenous Q12H   Elmarie Shiley, MD 04/23/2017, 10:06 AM

## 2017-04-23 NOTE — Progress Notes (Signed)
RT Note: Pt refusing CPAP at this time. 

## 2017-04-23 NOTE — Progress Notes (Signed)
Patient requesting tylenol 500 mg, which he states he is able to take daily at home as needed. Will contact MD on call.     [phone] Jamus, [place] ESLD requesting 500 mg tylenol, which he states he is able to take at home prn. Please advise. He is receiving blood now. T You Awaiting call back.

## 2017-04-23 NOTE — Progress Notes (Signed)
No further clinical bleeding (no BMs today).  No evid of encephalopathy--pt alert, no asteriixis.  Creatinine minimally improved, Renal note reviewed.  C/o abd pain--diffuse tenderness as was true yesterday, w/out overt peritoneal signs.  RECOMM (d/w covering FP Resident):  1. Ok to advance diet from GIB perspective--?renal diet 2. Consider obtaining Dietitian consult for updated instruction in sodium restriction, in view of recurring problem w/ ascites. 3. Would definitely order cell count/differential on paracentesis fluid tomorrow, to see if there's evid of SBP. 4. Would review choice of antbx:  Pt currently on Cipro, but was on Cipro PTA for SBP prophylaxis which might reduce its current effectiveness 5. Would consider giving a probiotic while on antbx, to reduce chance of C diff  Cleotis Nipper, M.D. Pager [phone] If no answer or after 5 PM call [phone]

## 2017-04-24 ENCOUNTER — Encounter (HOSPITAL_COMMUNITY): Payer: Self-pay | Admitting: Radiology

## 2017-04-24 ENCOUNTER — Inpatient Hospital Stay (HOSPITAL_COMMUNITY): Payer: Medicare HMO

## 2017-04-24 DIAGNOSIS — D62 Acute posthemorrhagic anemia: Secondary | ICD-10-CM

## 2017-04-24 DIAGNOSIS — E440 Moderate protein-calorie malnutrition: Secondary | ICD-10-CM | POA: Insufficient documentation

## 2017-04-24 HISTORY — PX: IR PARACENTESIS: IMG2679

## 2017-04-24 LAB — BPAM RBC
Blood Product Expiration Date: 201806092359
Blood Product Expiration Date: 201806122359
ISSUE DATE / TIME: 201806032045
ISSUE DATE / TIME: 201806031522
Unit Type and Rh: 600
Unit Type and Rh: 600

## 2017-04-24 LAB — CBC
HCT: 23.2 % — ABNORMAL LOW (ref 39.0–52.0)
HEMOGLOBIN: 7.8 g/dL — AB (ref 13.0–17.0)
MCH: 29.9 pg (ref 26.0–34.0)
MCHC: 33.6 g/dL (ref 30.0–36.0)
MCV: 88.9 fL (ref 78.0–100.0)
Platelets: 33 10*3/uL — ABNORMAL LOW (ref 150–400)
RBC: 2.61 MIL/uL — ABNORMAL LOW (ref 4.22–5.81)
RDW: 15.6 % — ABNORMAL HIGH (ref 11.5–15.5)
WBC: 4.2 10*3/uL (ref 4.0–10.5)

## 2017-04-24 LAB — BASIC METABOLIC PANEL
Anion gap: 10 (ref 5–15)
BUN: 60 mg/dL — AB (ref 6–20)
CHLORIDE: 105 mmol/L (ref 101–111)
CO2: 16 mmol/L — ABNORMAL LOW (ref 22–32)
CREATININE: 3.34 mg/dL — AB (ref 0.61–1.24)
Calcium: 8.7 mg/dL — ABNORMAL LOW (ref 8.9–10.3)
GFR calc Af Amer: 21 mL/min — ABNORMAL LOW (ref >60)
GFR calc non Af Amer: 18 mL/min — ABNORMAL LOW (ref >60)
GLUCOSE: 97 mg/dL (ref 65–99)
Potassium: 4.7 mmol/L (ref 3.5–5.1)
SODIUM: 131 mmol/L — AB (ref 135–145)

## 2017-04-24 LAB — TYPE AND SCREEN
ABO/RH(D): A NEG
Antibody Screen: NEGATIVE
UNIT DIVISION: 0
UNIT DIVISION: 0

## 2017-04-24 LAB — BODY FLUID CELL COUNT WITH DIFFERENTIAL
EOS FL: 0 %
Lymphs, Fluid: 6 %
MONOCYTE-MACROPHAGE-SEROUS FLUID: 59 % (ref 50–90)
Neutrophil Count, Fluid: 35 % — ABNORMAL HIGH (ref 0–25)
OTHER CELLS FL: 0 %
WBC FLUID: 465 uL (ref 0–1000)

## 2017-04-24 LAB — GRAM STAIN

## 2017-04-24 LAB — PROTEIN, PLEURAL OR PERITONEAL FLUID: Total protein, fluid: <3 g/dL

## 2017-04-24 LAB — LACTATE DEHYDROGENASE, PLEURAL OR PERITONEAL FLUID: LD, Fluid: 73 U/L — ABNORMAL HIGH (ref 3–23)

## 2017-04-24 LAB — ALBUMIN, PLEURAL OR PERITONEAL FLUID: Albumin, Fluid: <1 g/dL

## 2017-04-24 MED ORDER — PANTOPRAZOLE SODIUM 40 MG PO TBEC
40.0000 mg | DELAYED_RELEASE_TABLET | Freq: Every day | ORAL | Status: DC
  Administered 2017-04-24 – 2017-04-27 (×4): 40 mg via ORAL
  Filled 2017-04-24 (×4): qty 1

## 2017-04-24 MED ORDER — MIDODRINE HCL 5 MG PO TABS
10.0000 mg | ORAL_TABLET | Freq: Three times a day (TID) | ORAL | Status: DC
  Administered 2017-04-24 (×3): 10 mg via ORAL
  Filled 2017-04-24 (×2): qty 2

## 2017-04-24 MED ORDER — OCTREOTIDE ACETATE 50 MCG/ML IJ SOLN
200.0000 ug | Freq: Three times a day (TID) | INTRAMUSCULAR | Status: DC
  Administered 2017-04-24 – 2017-04-25 (×3): 200 ug via SUBCUTANEOUS
  Filled 2017-04-24 (×6): qty 4

## 2017-04-24 MED ORDER — MIDODRINE HCL 5 MG PO TABS
10.0000 mg | ORAL_TABLET | Freq: Three times a day (TID) | ORAL | Status: DC
  Filled 2017-04-24: qty 2

## 2017-04-24 MED ORDER — PROMETHAZINE HCL 12.5 MG PO TABS
6.2500 mg | ORAL_TABLET | Freq: Four times a day (QID) | ORAL | Status: DC | PRN
  Administered 2017-04-25: 6.25 mg via ORAL
  Filled 2017-04-24 (×2): qty 1

## 2017-04-24 MED ORDER — LIDOCAINE HCL 1 % IJ SOLN
INTRAMUSCULAR | Status: DC | PRN
  Administered 2017-04-24: 10 mL

## 2017-04-24 MED ORDER — ALBUMIN HUMAN 25 % IV SOLN
50.0000 g | Freq: Once | INTRAVENOUS | Status: AC
  Administered 2017-04-24: 50 g via INTRAVENOUS
  Filled 2017-04-24: qty 200

## 2017-04-24 MED ORDER — MESALAMINE 1000 MG RE SUPP
1000.0000 mg | Freq: Every day | RECTAL | Status: DC
  Administered 2017-04-24 – 2017-04-27 (×4): 1000 mg via RECTAL
  Filled 2017-04-24 (×4): qty 1

## 2017-04-24 MED ORDER — HYDROCORTISONE ACE-PRAMOXINE 1-1 % RE FOAM
1.0000 | Freq: Three times a day (TID) | RECTAL | Status: DC | PRN
  Filled 2017-04-24: qty 10

## 2017-04-24 MED ORDER — LIDOCAINE HCL 1 % IJ SOLN
INTRAMUSCULAR | Status: AC
  Filled 2017-04-24: qty 20

## 2017-04-24 MED ORDER — ENSURE ENLIVE PO LIQD
237.0000 mL | Freq: Three times a day (TID) | ORAL | Status: DC
  Administered 2017-04-24 – 2017-04-27 (×10): 237 mL via ORAL

## 2017-04-24 NOTE — Progress Notes (Signed)
Family Medicine Teaching Service Daily Progress Note Intern Pager: [phone]  Patient name: Vincent Ochoa Medical record number: [id number] Date of birth: 15-Sep-1956 Age: 61 y.o. Gender: male  Primary Care Provider: Bufford Lope, DO Consultants: GI, nephrology Code Status: Full  Pt Overview and Major Events to Date:  6/1 - Admitted for hematemesis  Assessment and Plan: CASHIS RILL is a 61 y.o. male presenting with bright red vomitus and abdominal pain. PMH is significant for cirrhosis of liver with recurrent ascites (followed by [facility] hepatology), CAD (stent), psoriasis, thrombocytopenia (followed by Heme), OSA, knee pain, obesity, GERD, Depression, PAF, HTN, esophageal varices, anemia of chronic disease  Anemia in setting of hematemesis/hematochezia. No hematemesis since admission. Hgb 7.8 today s/p 2U RBCs (BL ~ 9.0). Maintaining blood pressure.  S/p EGD on 04/22/17 by GI without active bleeding but solitary grade 2 esophageal varix banded.  - per GI, cell count/differential on paracentesis fluid today to evaluate for SBP. Review ABX choice since was on cipro PTA for SBP ppx which might reduce current effectiveness. - Continue octreotide and PO cipro for infection prophylaxis  - IV protonix - IV phenergan 12.5 mg q6h - monitor Hbg  Abdominal pain. Chronic recurrent ascites 2/2 liver failure. CT abdomen pelvis noted large volume ascites.  - Monitor pain - IV fentanyl 25 mg q6h prn - Having weekly paracenteses; consult IR today for more emergent paracentesis in setting of worsening distension and pain.  AKI. SCr elevated to 3.72 on from new BL ~ 2.0. Mildly improved at 3.34 today.  - CMP daily - Per Nephrology, HRS vs prerenal azotemia. Continue supportive management with octreotide/midodrine. If he has >6 L paracentesis, recommend 50 g albumin infusion (25%) to limit renal hypoperfusion/injury. - no acute need for HD   Hyperkalemia, resolved.  K 4.7 this AM.  Secondary to acute  kidney injury and GI bleed/increased uptake. - Continue to monitor -continue lactulose/kayexalate   Liver Cirrhosis. MELD score 31 (52.6% mortality risk in next 3 months). Followed by [facility] Hepatology and planning for possible liver transplant. - Contact [facility] in a.m. with FYI of admission - daily cipro for SBP prophylaxis - lactulose 30 g BID - midodrine 15 mg TID  Hemorrhoids. More comfortable today. Chronic, worsened by recent colonoscopy, per patient - Proctofoam and anusol suppository ordered prn for comfort  GERD:Stable. At home on Protonix 40 mg po BID and sucralfate 1g PO TID.  - IV PPI  DJM:EQASTM. Echo 11/23/2016 with EF 50-55%. No signs of edema or crackles. Lasix and spiro discontinued at last admission to [facility] 4/10-4/14.  - Monitor for signs of fluid overload on exam  CAD:S/p Left heart cath July 2016 showing nonobstructive coronary artery disease. Mid cx lesion previously treated with a bare metal stent in 10/2014. Follows with Dr. Burt Knack outpatient.   Thrombocytopenia:Stable. At home on Promacta 100 mg po daily.  - Monitor  HDQ:QIWLNL. To be using CPAP but awaiting equipment.   - CPAP qhs  Depression:Stable. At home on Wellbutrin.  - wellbutrin 150 mg po daily   PAF:On problem list. Not on pharmacologic treatment currently given history of bleeds.   FEN/GI: heart healthy/carb mod  Prophylaxis: SCDs  Disposition: Pending improvement and stable labs  Subjective:  Overnight received blood and did well. This morning states his abdominal pain continues to be severe. No fever/chills. No episodes of hematemesis since admission.   Objective: Temp:  [97.8 F (36.6 C)-100.8 F (38.2 C)] 98.1 F (36.7 C) (06/04 0547) Pulse Rate:  [87-92]  89 (06/04 0547) Resp:  [13-22] 20 (06/04 0547) BP: (100-122)/(53-103) 117/73 (06/04 0547) SpO2:  [81 %-98 %] 97 % (06/04 0547) Physical Exam: General: Chronically ill appearing man, jaundiced,  NAD Eyes: subconjunctival hemorrhage of left eye Cardiovascular: RRR, S1, S2, no m/r/g Respiratory: CTAB, normal WOB Abdomen: distended, + fluid wave, tense, TTP throughout Extremities: Trace LE edema Neuro: AOx3  Laboratory:  Recent Labs Lab 04/22/17 0500 04/23/17 0613 04/23/17 1341 04/24/17 0354  WBC 6.6 4.5  --  4.2  HGB 8.3* 7.1* 6.3* 7.8*  HCT 24.5* 21.0* 18.4* 23.2*  PLT 38* 44*  --  33*    Recent Labs Lab 04/21/17 1658 04/22/17 0500  04/22/17 1350  04/23/17 0613 04/23/17 1341 04/24/17 0354  NA 130* 131*  --   --   --  130*  --  131*  K 5.9* 6.4*  < > 5.4*  < > 5.2*  5.2* 4.8 4.7  CL 107 108  --   --   --  108  --  105  CO2 13* 14*  --   --   --  14*  --  16*  BUN 56* 59*  --   --   --  62*  --  60*  CREATININE 3.72* 3.64*  --   --   --  3.55*  --  3.34*  CALCIUM 9.1 8.6*  --   --   --  8.8*  --  8.7*  PROT 6.6 5.8*  --   --   --  5.9*  --   --   BILITOT 2.9* 3.3*  --   --   --  3.4*  --   --   ALKPHOS 85 75  --   --   --  63  --   --   ALT 38 37  --   --   --  27  --   --   AST 62* 52*  --   --   --  42*  --   --   GLUCOSE 133* 102*  --  105*  --  95  --  97  < > = values in this interval not displayed.  Imaging/Diagnostic Tests: Ct Abdomen Pelvis Wo Contrast  Result Date: 04/21/2017 IMPRESSION: 1. Large volume ascites noted throughout the abdomen and pelvis. Fluid extends into a tiny right inguinal hernia. 2. Findings of hepatic cirrhosis. Scattered splenic and gastric varices noted. 3. Splenomegaly. 4. Scattered aortic atherosclerosis. 5. Diffuse coronary artery calcification noted. 6. Nonobstructing right renal stones measure up to 7 mm size. Electronically Signed   By: Garald Balding M.D.   On: 04/21/2017 21:33   Bufford Lope, DO 04/24/2017, 6:55 AM PGY-1, [place] Intern pager: [phone], text pages welcome

## 2017-04-24 NOTE — Progress Notes (Signed)
No major changes. Received 2 units of blood yesterday with essentially appropriate rise in hemoglobin. No evident bleeding from either end.   Chief complaint at this time is hemorrhoidal irritation. Exam shows an engorged external hemorrhoid which I was able to partially reducible digitally. ProctoFoam has already been ordered, I will add a mesalamine suppository.   He still has some abdominal pain but it is not as bad as yesterday; for large-volume paracentesis today. On exam, he has massive ascites, but mental status is grossly normal.  Creatinine continues to improve. Calculated meld score is 30 based on most current labs available.  Awaiting paracentesis, dietary instruction.  No new suggestions apart from mesalamine suppository for hemorrhoids, which I will order.  Upon discharge, the patient will need to have early follow-up with the transplant team at [facility], since his meld score has crept up into the transplantable range.  Cleotis Nipper, M.D. Pager [phone] If no answer or after 5 PM call [phone]

## 2017-04-24 NOTE — Plan of Care (Signed)
Problem: Food- and Nutrition-Related Knowledge Deficit (NB-1.1) Goal: Nutrition education Formal process to instruct or train a patient/client in a skill or to impart knowledge to help patients/clients voluntarily manage or modify food choices and eating behavior to maintain or improve health. Outcome: Adequate for Discharge Nutrition Education Note  RD consulted for nutrition education regarding low sodium diet.  RD provided "Cirrhosis Nutrition Therapy" handout from the Academy of [employer]. Reviewed patient's dietary recall. Provided examples on ways to decrease sodium intake in diet. Discouraged intake of processed foods and use of salt shaker. Discussed ways that pt could alter favorite foods and recipes to limit sodium content.   Also discussed importance of consuming small, frequent, high protein meals to assist with intake. Encouraged eating approximately 4-6 times per day, related to early satiety. Discussed use of nutritional supplements, such as Ensure, to supplement intake.  RD discussed why it is important for patient to adhere to diet recommendations. Teach back method used.  Expect fair to good compliance.  Body mass index is 28.33 kg/m. Pt meets criteria for overweight based on current BMI.  Current diet order is 2 gram sodium, patient is consuming approximately 0-100% of meals at this time.   RD will continue to follow for acute nutrition issues.  Vincent Ochoa A. Jimmye Norman, RD, LDN, CDE Pager: [phone] After hours Pager: [phone]

## 2017-04-24 NOTE — Progress Notes (Signed)
[facility]  Patient Status: Active (receiving services up to time of hospitalization)  [facility] is providing the following services: RN, PT and MSW  If patient discharges after hours, please call [phone].   Vincent Ochoa 04/24/2017, 4:33 PM

## 2017-04-24 NOTE — Progress Notes (Signed)
Patient ID: Vincent Ochoa, male   DOB: Apr 23, 1956, 61 y.o.   MRN: [id number]  [facility] Progress Note    Subjective:    Hemorrhoids hurting, hard to find comfortable position Preparing to go down to radiology for paracentesis   Objective:   BP 105/62 (BP Location: Left Arm)   Pulse 82   Temp 98.7 F (37.1 C)   Resp 18   Ht 6\' 3"  (1.905 m)   Wt 102.8 kg (226 lb 10.1 oz)   SpO2 98%   BMI 28.33 kg/m   Intake/Output Summary (Last 24 hours) at 04/24/17 1442 Last data filed at 04/24/17 1431  Gross per 24 hour  Intake          2012.17 ml  Output             1100 ml  Net           912.17 ml   Weight change:   Physical Exam: Lying on his side - painful hemorrhoids VS as noted Alert, oriented, conversant Lungs clear Regular rhythm S1S2 No S3 Huge distended abdomen with ascites, pain with palpation 1+ LE edema No asterixus  Ct Abdomen Pelvis Wo Contrast  Result Date: 04/21/2017 CLINICAL DATA:  Acute onset right lower quadrant abdominal pain, rectal bleeding and hematemesis. Initial encounter. EXAM: CT ABDOMEN AND PELVIS WITHOUT CONTRAST TECHNIQUE: Multidetector CT imaging of the abdomen and pelvis was performed following the standard protocol without IV contrast. COMPARISON:  CT of the abdomen and pelvis performed 03/29/2017 FINDINGS: Lower chest: Diffuse coronary artery calcifications are seen. Mild bibasilar atelectasis is noted. Hepatobiliary: There is a diffusely nodular contour to the liver, compatible with hepatic cirrhosis. The patient is status post cholecystectomy, with clips noted at the gallbladder fossa. The common bile duct is grossly unremarkable in appearance. Pancreas: The pancreas is within normal limits. Spleen: The spleen is enlarged, measuring 15.8 cm in length. Scattered splenic and gastric varices are seen. Adrenals/Urinary Tract: The adrenal glands are difficult to fully assess but appear grossly unremarkable. Nonobstructing right renal stones  measure up to 7 mm in size. The kidneys are otherwise unremarkable. There is no hydronephrosis. No obstructing ureteral stones are identified. No perinephric stranding is seen. Stomach/Bowel: The stomach is unremarkable in appearance. The small bowel is within normal limits. The appendix is not visualized; there is no evidence for appendicitis. The colon is unremarkable in appearance. Vascular/Lymphatic: Scattered calcification is seen along the abdominal aorta and its branches. The abdominal aorta is otherwise grossly unremarkable. The inferior vena cava is grossly unremarkable. No retroperitoneal lymphadenopathy is seen. No pelvic sidewall lymphadenopathy is identified. Reproductive: The bladder is mildly distended and grossly unremarkable. The prostate remains normal in size. Other: Large volume ascites is noted throughout the abdomen and pelvis. Fluid extends into a tiny right inguinal hernia. Musculoskeletal: No acute osseous abnormalities are identified. Mild facet disease is noted at the lower lumbar spine. The visualized musculature is unremarkable in appearance. IMPRESSION: 1. Large volume ascites noted throughout the abdomen and pelvis. Fluid extends into a tiny right inguinal hernia. 2. Findings of hepatic cirrhosis. Scattered splenic and gastric varices noted. 3. Splenomegaly. 4. Scattered aortic atherosclerosis. 5. Diffuse coronary artery calcification noted. 6. Nonobstructing right renal stones measure up to 7 mm size. Electronically Signed   By: Garald Balding M.D.   On: 04/21/2017 21:33   Recent Labs Lab 04/21/17 1658 04/22/17 0500  04/22/17 1119 04/22/17 1350 04/22/17 1834 04/22/17 2220 04/23/17 0613 04/23/17 1341 04/24/17 0354  NA  130* 131*  --   --   --   --   --  130*  --  131*  K 5.9* 6.4*  < > 5.8* 5.4* 5.8* 5.3* 5.2*  5.2* 4.8 4.7  CL 107 108  --   --   --   --   --  108  --  105  CO2 13* 14*  --   --   --   --   --  14*  --  16*  GLUCOSE 133* 102*  --   --  105*  --   --  95   --  97  BUN 56* 59*  --   --   --   --   --  62*  --  60*  CREATININE 3.72* 3.64*  --   --   --   --   --  3.55*  --  3.34*  CALCIUM 9.1 8.6*  --   --   --   --   --  8.8*  --  8.7*  < > = values in this interval not displayed.   Recent Labs Lab 04/21/17 1658 04/22/17 0500 04/23/17 0613 04/23/17 1341 04/24/17 0354  WBC 8.8 6.6 4.5  --  4.2  HGB 9.3* 8.3* 7.1* 6.3* 7.8*  HCT 27.2* 24.5* 21.0* 18.4* 23.2*  MCV 89.8 91.4 90.9  --  88.9  PLT 67* 38* 44*  --  33*   Medications:    . buPROPion  150 mg Oral QODAY  . ciprofloxacin  500 mg Oral Daily  . docusate sodium  100 mg Oral BID  . lactulose  30 g Oral QID  . mesalamine  1,000 mg Rectal QHS  . midodrine  10 mg Oral TID WC  . multivitamin with minerals  1 tablet Oral Daily  . octreotide  200 mcg Subcutaneous TID  . pantoprazole  40 mg Oral Daily  . saccharomyces boulardii  250 mg Oral BID  . sodium bicarbonate  1,300 mg Oral TID  . sodium chloride flush  3 mL Intravenous Q12H   . albumin human      Background 61 year old M. PMH non-obstructive CAD, h/o CVA, HLD, OSA, PAF, thrombocytopenia, cirrhosis (not alcoholic), in the process of being evaluated/listed for liver transplant [facility]). Has baseline CKD3 w/creatinine that has ranged 1.3-2.2 past 6 months. Admitted with 2 episodes of hematemesis/bloody BM's. Hb 8.3 nadir down to 6.3. Noted on admit to have creatinine 3.7, K elevated, metabolic acidosis. Octreotide, midodrine started for presumptive dx HRS. Of note 4 days PTA had 10L paracentesis.with 50 gm alb infusion.   Assessment/ Plan:     1. AKI on CKD3: Labs pointing towards HRS versus prerenal azotemia - getting albumin for the latter to try and increase intravascular volume/renal perfusion. FeNa <<1. Creatinine sl better today. Says UOP but not over 600 cc/day if records complete. UA RBC's no protein. Getting supportive management with octreotide/midodrine. Getting paracentesis today. As previously recommended if he has  >6 L paracentesis, recommend 50 g albumin infusion (25%) to limit renal hypoperfusion/injury. 2. Microscopic hematuria - has non-obstructing renal stones on CT - ? If related. No proteinuria on UA. No gross hematuria.  3. Hyperkalemia: Secondary to AKI + GI bleed/increased uptake. Improved with increased bowel movements from lactulose/Kayexalate 4. Cirrhosis - GI following. For LVP todaay. Current MELD score 30. [facility] liver service aware of pt. 5. UGI bleed - : Seen by Dr. Cristina Gong ->EGD with banding of a solitary grade 2 esophageal varix-evidence  of portal gastropathy noted. No evidence of active bleeding noted. 6. Anemia: Chronic ds + ABLA. S/p transfusion 1 unit 6/3, 1 unit 6/4. Hb up to 7.8. 7. Anion gap metabolic acidosis: 2/2 AKI + /ESLD. Current Na bicarb dose at 2 tabs TID   Jamal Maes, MD [facility] [phone] Pager 04/24/2017, 2:43 PM

## 2017-04-24 NOTE — Progress Notes (Signed)
Initial Nutrition Assessment  DOCUMENTATION CODES:   Non-severe (moderate) malnutrition in context of chronic illness  INTERVENTION:   -Ensure Enlive po TID, each supplement provides 350 kcal and 20 grams of protein -Provided education on low sodium, high protein diet; encouraged small, frequent meals; provided "CIrrhosis Nutrition Therapy" handout from [facility] with RD contact information. See education note on 04/24/17 for further details  NUTRITION DIAGNOSIS:   Malnutrition (Mild) related to chronic illness (cirrhosis) as evidenced by energy intake < or equal to 75% for > or equal to 1 month, mild depletion of body fat, moderate depletion of body fat, mild depletion of muscle mass, moderate depletions of muscle mass, moderate to severe fluid accumulation.  GOAL:   Patient will meet greater than or equal to 90% of their needs  MONITOR:   PO intake, Supplement acceptance, Labs, Weight trends, Skin, I & O's  REASON FOR ASSESSMENT:   Consult Assessment of nutrition requirement/status  ASSESSMENT:   61 yo M with known cirrhosis and recurrent ascites, thrombocytopenia, anemia of chronic disease who presents with hematemesis and rectal bleeding.  Pt with hematemesis.   6/2- s/p upper GI, revealing solitary grade II esophageal varix and no active bleeding  Per GI notes from 04/24/17, pt will receive early follow-up with [facility] transplant team, due to MELD score. Pt to have paracentesis today.   Spoke with pt and wife at bedside. They reports that pt has had ongoing poor appetite over the past month. Pt wife reports that pt appetite has drastically declined, especially when pt has severe ascites, which leads to early satiety. Pt will often only eat bites and sips at meals. Commonly consumed foods include tortellini, frozen or canned vegetable (which are drained and rinsed, yogurt, fruit, and fruit smoothies (made with fruit juice, frozen fruit, yogurt, and ice). Pt seasons  food with Mrs Deliah Boston and occasional butter. Pt wife shares that they recently met with transplant RD at [facility], who recommended Ensure supplements, however, have not purchased yet, due to finances.   Pt expresses frustration over gaining weight despite eating less. Explained to pt and wife that ascites is likely masking true weight loss. Noted pt has experienced a 24% wt loss over the past 6 months, which is significant at time frame. Spent most of visit educating pt and family on ways to optimize nutritional status by consuming small, frequent meals, increasing protein in diet, and decreasing sodium. See education note for further details.   Nutrition-Focused physical exam completed. Findings are mild to moderate fat depletion, mild to moderate muscle depletion, and moderate edema.   Labs reviewed: Na: 131.   Diet Order:  Diet 2 gram sodium Room service appropriate? Yes; Fluid consistency: Thin  Skin:  Reviewed, no issues  Last BM:  04/24/17  Height:   Ht Readings from Last 1 Encounters:  04/22/17 6' 3" (1.905 m)    Weight:   Wt Readings from Last 1 Encounters:  04/22/17 226 lb 10.1 oz (102.8 kg)    Ideal Body Weight:  89.1 kg  BMI:  Body mass index is 28.33 kg/m.  Estimated Nutritional Needs:   Kcal:  2400-2600  Protein:  130-145 grams  Fluid:  >2.2 L  EDUCATION NEEDS:   Education needs addressed  Adonis Yim A. Jimmye Norman, RD, LDN, CDE Pager: [phone] After hours Pager: [phone]

## 2017-04-24 NOTE — Procedures (Signed)
PROCEDURE SUMMARY:  Successful US guided paracentesis from LLQ.  Yielded 10 L of clear yellow fluid.  No immediate complications.  Pt tolerated well.   Specimen was sent for labs.  Ascencion Dike PA-C 04/24/2017 3:44 PM

## 2017-04-25 ENCOUNTER — Ambulatory Visit (HOSPITAL_COMMUNITY): Payer: Medicare HMO

## 2017-04-25 DIAGNOSIS — E440 Moderate protein-calorie malnutrition: Secondary | ICD-10-CM

## 2017-04-25 LAB — RENAL FUNCTION PANEL
Albumin: 3.8 g/dL (ref 3.5–5.0)
Anion gap: 11 (ref 5–15)
BUN: 63 mg/dL — AB (ref 6–20)
CHLORIDE: 106 mmol/L (ref 101–111)
CO2: 16 mmol/L — ABNORMAL LOW (ref 22–32)
CREATININE: 3.32 mg/dL — AB (ref 0.61–1.24)
Calcium: 8.4 mg/dL — ABNORMAL LOW (ref 8.9–10.3)
GFR calc Af Amer: 22 mL/min — ABNORMAL LOW (ref >60)
GFR calc non Af Amer: 19 mL/min — ABNORMAL LOW (ref >60)
Glucose, Bld: 103 mg/dL — ABNORMAL HIGH (ref 65–99)
Phosphorus: 3.9 mg/dL (ref 2.5–4.6)
Potassium: 4.5 mmol/L (ref 3.5–5.1)
SODIUM: 133 mmol/L — AB (ref 135–145)

## 2017-04-25 LAB — CBC
HCT: 21.7 % — ABNORMAL LOW (ref 39.0–52.0)
Hemoglobin: 7.5 g/dL — ABNORMAL LOW (ref 13.0–17.0)
MCH: 30.4 pg (ref 26.0–34.0)
MCHC: 34.6 g/dL (ref 30.0–36.0)
MCV: 87.9 fL (ref 78.0–100.0)
PLATELETS: 30 10*3/uL — AB (ref 150–400)
RBC: 2.47 MIL/uL — ABNORMAL LOW (ref 4.22–5.81)
RDW: 15.3 % (ref 11.5–15.5)
WBC: 3.3 10*3/uL — ABNORMAL LOW (ref 4.0–10.5)

## 2017-04-25 MED ORDER — OCTREOTIDE ACETATE 100 MCG/ML IJ SOLN
200.0000 ug | Freq: Three times a day (TID) | INTRAMUSCULAR | Status: DC
  Administered 2017-04-25 – 2017-04-27 (×8): 200 ug via SUBCUTANEOUS
  Filled 2017-04-25 (×9): qty 2

## 2017-04-25 MED ORDER — OCTREOTIDE ACETATE 50 MCG/ML IJ SOLN: 200.0000 ug | Freq: Three times a day (TID) | INTRAMUSCULAR | 0 refills | Status: AC

## 2017-04-25 MED ORDER — PHENYLEPH-SHARK LIV OIL-MO-PET 0.25-3-14-71.9 % RE OINT
TOPICAL_OINTMENT | Freq: Two times a day (BID) | RECTAL | Status: DC | PRN
  Filled 2017-04-25: qty 28.4

## 2017-04-25 MED ORDER — SODIUM BICARBONATE 650 MG PO TABS: 1950.0000 mg | ORAL_TABLET | Freq: Three times a day (TID) | ORAL | Status: AC

## 2017-04-25 MED ORDER — HYDROCORTISONE ACE-PRAMOXINE 1-1 % RE FOAM
1.0000 | Freq: Three times a day (TID) | RECTAL | Status: DC
  Administered 2017-04-25 – 2017-04-27 (×8): 1 via RECTAL
  Filled 2017-04-25 (×2): qty 10

## 2017-04-25 MED ORDER — MIDODRINE HCL 5 MG PO TABS
15.0000 mg | ORAL_TABLET | ORAL | Status: DC
  Administered 2017-04-25 – 2017-04-27 (×9): 15 mg via ORAL
  Filled 2017-04-25 (×9): qty 3

## 2017-04-25 MED ORDER — PROMETHAZINE HCL 12.5 MG PO TABS: 6.2500 mg | ORAL_TABLET | Freq: Four times a day (QID) | ORAL | 0 refills | Status: AC | PRN

## 2017-04-25 MED ORDER — SODIUM BICARBONATE 650 MG PO TABS
1950.0000 mg | ORAL_TABLET | Freq: Three times a day (TID) | ORAL | Status: DC
  Administered 2017-04-25 – 2017-04-27 (×7): 1950 mg via ORAL
  Filled 2017-04-25 (×8): qty 3

## 2017-04-25 MED ORDER — HYDROCORTISONE ACE-PRAMOXINE 1-1 % RE FOAM: 1.0000 | Freq: Three times a day (TID) | RECTAL | 0 refills | Status: AC

## 2017-04-25 MED ORDER — HYDROCORTISONE ACETATE 25 MG RE SUPP
25.0000 mg | Freq: Two times a day (BID) | RECTAL | Status: DC
  Administered 2017-04-25 – 2017-04-27 (×6): 25 mg via RECTAL
  Filled 2017-04-25 (×6): qty 1

## 2017-04-25 MED ORDER — SACCHAROMYCES BOULARDII 250 MG PO CAPS: 250.0000 mg | ORAL_CAPSULE | Freq: Two times a day (BID) | ORAL | Status: AC

## 2017-04-25 MED ORDER — LACTULOSE 20 GM/30ML PO SOLN: 45.0000 mL | Freq: Four times a day (QID) | ORAL | 3 refills | Status: AC

## 2017-04-25 NOTE — Care Management Note (Addendum)
Case Management Note  Patient Details  Name: Vincent Ochoa MRN: [id number] Date of Birth: [birth year]  Subjective/Objective:         Presents with hematemesis and rectal bleeding, hx of cirrhosis and recurrent ascites, weekly paracentesis, thrombocytopenia, anemia of chronic disease. From home with wife.    - in the process of being evaluated/listed for liver transplant [facility])   Dolphus Jenny (Spouse) Carney Living (Niece)     [phone]      WGN:FAOZHY [name]  Action/Plan: Discharge planning in process...Marland KitchenCM will continue following for disposition needs.    Expected Discharge Date:  04/25/17               Expected Discharge Plan:  Home/Self Care  In-House Referral:     Discharge planning Services  CM Consult  Post Acute Care Choice:    Choice offered to:     DME Arranged:    DME Agency:     HH Arranged:   RN,PT,SW (active with [facility] PTA) HH Agency:   [facility]  Status of Service:  In process, will continue to follow  If discussed at Long Length of Stay Meetings, dates discussed:    Additional Comments:  Sharin Mons, RN 04/25/2017, 5:55 PM

## 2017-04-25 NOTE — Progress Notes (Signed)
Patient ID: Vincent Ochoa, male   DOB: 06/07/1956, 61 y.o.   MRN: [id number]  [facility] Progress Note    Subjective:    10L paracentesis yesterday Renal function stable low Discussed w/wife need for outpt octreotide - she states paperwork for this already completed via [facility] (was to have been on 200 mcg TID but does not have the drug yet)   Objective:   BP (!) 96/45 (BP Location: Right Arm)   Pulse 82   Temp 99.3 F (37.4 C)   Resp 18   Ht 6\' 3"  (1.905 m)   Wt 103.7 kg (228 lb 9.9 oz)   SpO2 99%   BMI 28.58 kg/m   Intake/Output Summary (Last 24 hours) at 04/25/17 1054 Last data filed at 04/25/17 7829  Gross per 24 hour  Intake           669.17 ml  Output              400 ml  Net           269.17 ml   Weight change:   Physical Exam: Says having bleeding from his hemorrhoids VS as noted Alert, oriented, conversant Lungs clear Regular rhythm S1S2 No S3 Abd still huge despite 10L off yesterday 1+ LE edema No asterixus  Ct Abdomen Pelvis Wo Contrast  Result Date: 04/21/2017 CLINICAL DATA:  Acute onset right lower quadrant abdominal pain, rectal bleeding and hematemesis. Initial encounter. EXAM: CT ABDOMEN AND PELVIS WITHOUT CONTRAST TECHNIQUE: Multidetector CT imaging of the abdomen and pelvis was performed following the standard protocol without IV contrast. COMPARISON:  CT of the abdomen and pelvis performed 03/29/2017 FINDINGS: Lower chest: Diffuse coronary artery calcifications are seen. Mild bibasilar atelectasis is noted. Hepatobiliary: There is a diffusely nodular contour to the liver, compatible with hepatic cirrhosis. The patient is status post cholecystectomy, with clips noted at the gallbladder fossa. The common bile duct is grossly unremarkable in appearance. Pancreas: The pancreas is within normal limits. Spleen: The spleen is enlarged, measuring 15.8 cm in length. Scattered splenic and gastric varices are seen. Adrenals/Urinary Tract: The adrenal  glands are difficult to fully assess but appear grossly unremarkable. Nonobstructing right renal stones measure up to 7 mm in size. The kidneys are otherwise unremarkable. There is no hydronephrosis. No obstructing ureteral stones are identified. No perinephric stranding is seen. Stomach/Bowel: The stomach is unremarkable in appearance. The small bowel is within normal limits. The appendix is not visualized; there is no evidence for appendicitis. The colon is unremarkable in appearance. Vascular/Lymphatic: Scattered calcification is seen along the abdominal aorta and its branches. The abdominal aorta is otherwise grossly unremarkable. The inferior vena cava is grossly unremarkable. No retroperitoneal lymphadenopathy is seen. No pelvic sidewall lymphadenopathy is identified. Reproductive: The bladder is mildly distended and grossly unremarkable. The prostate remains normal in size. Other: Large volume ascites is noted throughout the abdomen and pelvis. Fluid extends into a tiny right inguinal hernia. Musculoskeletal: No acute osseous abnormalities are identified. Mild facet disease is noted at the lower lumbar spine. The visualized musculature is unremarkable in appearance. IMPRESSION: 1. Large volume ascites noted throughout the abdomen and pelvis. Fluid extends into a tiny right inguinal hernia. 2. Findings of hepatic cirrhosis. Scattered splenic and gastric varices noted. 3. Splenomegaly. 4. Scattered aortic atherosclerosis. 5. Diffuse coronary artery calcification noted. 6. Nonobstructing right renal stones measure up to 7 mm size. Electronically Signed   By: Garald Balding M.D.   On: 04/21/2017 21:33  Recent Labs Lab 04/21/17 1658 04/22/17 0500  04/22/17 1350 04/22/17 1834 04/22/17 2220 04/23/17 0613 04/23/17 1341 04/24/17 0354 04/25/17 0456  NA 130* 131*  --   --   --   --  130*  --  131* 133*  K 5.9* 6.4*  < > 5.4* 5.8* 5.3* 5.2*  5.2* 4.8 4.7 4.5  CL 107 108  --   --   --   --  108  --  105  106  CO2 13* 14*  --   --   --   --  14*  --  16* 16*  GLUCOSE 133* 102*  --  105*  --   --  95  --  97 103*  BUN 56* 59*  --   --   --   --  62*  --  60* 63*  CREATININE 3.72* 3.64*  --   --   --   --  3.55*  --  3.34* 3.32*  CALCIUM 9.1 8.6*  --   --   --   --  8.8*  --  8.7* 8.4*  PHOS  --   --   --   --   --   --   --   --   --  3.9  < > = values in this interval not displayed.   Recent Labs Lab 04/22/17 0500 04/23/17 0613 04/23/17 1341 04/24/17 0354 04/25/17 0456  WBC 6.6 4.5  --  4.2 3.3*  HGB 8.3* 7.1* 6.3* 7.8* 7.5*  HCT 24.5* 21.0* 18.4* 23.2* 21.7*  MCV 91.4 90.9  --  88.9 87.9  PLT 38* 44*  --  33* 30*   Medications:    . buPROPion  150 mg Oral QODAY  . ciprofloxacin  500 mg Oral Daily  . docusate sodium  100 mg Oral BID  . feeding supplement (ENSURE ENLIVE)  237 mL Oral TID BM  . lactulose  30 g Oral QID  . mesalamine  1,000 mg Rectal QHS  . midodrine  15 mg Oral 3 times per day  . multivitamin with minerals  1 tablet Oral Daily  . octreotide  200 mcg Subcutaneous TID  . pantoprazole  40 mg Oral Daily  . saccharomyces boulardii  250 mg Oral BID  . sodium bicarbonate  1,300 mg Oral TID  . sodium chloride flush  3 mL Intravenous Q12H     Background 62 year old M. PMH non-obstructive CAD, h/o CVA, HLD, OSA, PAF, thrombocytopenia, cirrhosis (not alcoholic), in the process of being evaluated/listed for liver transplant [facility]). Has baseline CKD3 w/creatinine that has ranged 1.3-2.2 past 6 months. Admitted with 2 episodes of hematemesis/bloody BM's. Hb 8.3 nadir down to 6.3. Noted on admit to have creatinine 3.7, K elevated, metabolic acidosis. Octreotide, midodrine started for presumptive dx HRS. (Of note 4 days PTA had 10L paracentesis.with 50 gm alb infusion). Renal function minimal improvement creatinine 3.3 on octreotide/midodrine and post albumin.   Assessment/ Plan:     1. AKI on CKD3: Labs pointing towards HRS  - s/p albumin + ongoing octreotide/midodrine.  FeNa <<1. Creatinine stable and may be new baseline. UA RBC's no protein. Getting supportive management with octreotide/midodrine. Needs to be on octreotide at home - was approved already by his insurance for 200 mcg TID but they do not have the med at home yet. Was already on midodrine 10 TID as well.  2. Microscopic hematuria - has non-obstructing renal stones on CT - ? If related. No proteinuria on  UA. No gross hematuria.  3. Hyperkalemia: Secondary to AKI + GI bleed/increased uptake. Improved with increased bowel movements from lactulose/Kayexalate. Stable.  4. Cirrhosis - GI following. S/p 10 liter paracentesis yesterday. Current MELD score 30. [facility] liver service aware of pt.Has not yet been listed for transplant. 5. UGI bleed - : Seen by Dr. Cristina Gong ->EGD with banding of a solitary grade 2 esophageal varix-evidence of portal gastropathy noted.  6. Anemia: Chronic ds + ABLA. S/p transfusion 1 unit 6/3, 1 unit 6/4. Hb 7's 7. Anion gap metabolic acidosis: 2/2 AKI + /ESLD. Current Na bicarb dose at 2 tabs TID increase to 3TID  Disposition - Creatinine stable at 3.3 May be looking at new baseline. K fine. Bicarb still too low and have increased Na bicarb to 3 tid.  (Goal CO2 should be at least 20, preferably 22). Dr. Cristina Gong has OK'd for d/c from GI perspective. Needs to go home on both octreotide and midodrine. Has the midodrine. Octreotide was approved by his insurance company for dose fo 200 mcg TID (via [facility])  but they do not have the drug and should try to get that for them before he goes home. Wife says he has Nephrology specialists at [facility] so would not need local followup  with [place]. He should be on a renal (low K) diet at discharge. Renal will sign off at this time.  Jamal Maes, MD [facility] [phone] Pager 04/25/2017, 10:54 AM

## 2017-04-25 NOTE — Progress Notes (Signed)
Family Medicine Teaching Service Daily Progress Note Intern Pager: [phone]  Patient name: Vincent Ochoa Medical record number: [id number] Date of birth: Oct 27, 1956 Age: 61 y.o. Gender: male  Primary Care Provider: Bufford Lope, DO Consultants: GI, nephrology Code Status: Full  Pt Overview and Major Events to Date:  6/1 - Admitted for hematemesis  Assessment and Plan: Vincent Ochoa is a 61 y.o. male presenting with bright red vomitus and abdominal pain. PMH is significant for cirrhosis of liver with recurrent ascites (followed by [facility] hepatology), CAD (stent), psoriasis, thrombocytopenia (followed by Heme), OSA, knee pain, obesity, GERD, Depression, PAF, HTN, esophageal varices, anemia of chronic disease  Anemia in setting of hematemesis/hematochezia, stable. No hematemesis since admission. Hgb stable at 7.5 today s/p 2U RBCs earlier this hospital stay (BL ~ 9.0).  S/p EGD on 04/22/17 by GI without active bleeding but solitary grade 2 esophageal varix banded.  - per GI, mesalamine suppository added for hemorrhoids. - Continue octreotide and PO cipro for infection prophylaxis  - IV protonix - IV phenergan 12.5 mg q6h - monitor Hbg  Abdominal pain 2/2 chronic recurrent ascites from liver failure.  S/p paracentesis yesterday 04/24/17 with removal of 10L. Cell count showing WBC 465, 35% neurptrophils. Neg gram stain. Unlikely to be SBP. - Monitor pain - IV fentanyl 25 mg q6h prn - Follow up on ascitic fluid culture  AKI. SCr elevated to 3.72 on from new BL ~ 2.0. Mildly improved at 3.32 and stable today.  - CMP daily - Per Nephrology, HRS vs prerenal azotemia. Continue supportive management with octreotide/midodrine. - no acute need for HD   Liver Cirrhosis. MELD score 31 (52.6% mortality risk in next 3 months). Followed by [facility] Hepatology and planning for possible liver transplant. Blood pressure decreased slightly to 90/40s from 110/60s.  - [facility] FYI'd of admission - daily cipro for SBP  prophylaxis - lactulose 30 g BID - midodrine increased from 10mg  to 15 mg TID for symptomatic management of lower BP today.   Hemorrhoids. More comfortable today. Chronic, worsened by recent colonoscopy, per patient - Proctofoam and anusol suppository ordered prn for comfort. Mesalamine suppository added.  GERD:Stable. At home on Protonix 40 mg po BID and sucralfate 1g PO TID.  - IV PPI  KTG:YBWLSL. Echo 11/23/2016 with EF 50-55%. No signs of edema or crackles. Lasix and spiro discontinued at last admission to [facility] 4/10-4/14.  - Monitor for signs of fluid overload on exam  CAD:S/p Left heart cath July 2016 showing nonobstructive coronary artery disease. Mid cx lesion previously treated with a bare metal stent in 10/2014. Follows with Dr. Burt Knack outpatient.   Thrombocytopenia:Stable. At home on Promacta 100 mg po daily.  - Monitor  HTD:SKAJGO. To be using CPAP but awaiting equipment.   - CPAP qhs  Depression:Stable. At home on Wellbutrin.  - wellbutrin 150 mg po daily   PAF:On problem list. Not on pharmacologic treatment currently given history of bleeds.   Hyperkalemia, resolved.  K 4.5 this AM.  Secondary to acute kidney injury and GI bleed/increased uptake. - Continue to monitor - continue lactulose/kayexalate   FEN/GI: heart healthy/carb mod  Prophylaxis: SCDs  Disposition: Pending improvement and stable labs  Subjective:  States feels much improved after paracentesis, abdominal pain is better. No fever/chills. States mesalamine suppository provided relief for his hemorrhoids. Dizzy this morning but also has felt nauseous with some vomiting of regurgitated food. No hematemesis since admission.  Objective: Temp:  [98.7 F (37.1 C)-99.3 F (37.4 C)] 99.3 F (  37.4 C) (06/05 0605) Pulse Rate:  [82-93] 82 (06/05 0605) Resp:  [18] 18 (06/05 0605) BP: (75-110)/(37-62) 96/45 (06/05 0615) SpO2:  [98 %-99 %] 99 % (06/05 0605) Weight:  [103.7 kg  (228 lb 9.9 oz)] 103.7 kg (228 lb 9.9 oz) (06/05 0500) Physical Exam: General: Chronically ill appearing man, jaundiced, NAD Eyes: subconjunctival hemorrhage of left eye Cardiovascular: RRR, S1, S2, no m/r/g Respiratory: CTAB, normal WOB Abdomen: distended, + fluid wave, soft, TTP throughout but improved from yesterday Extremities: Trace LE edema Neuro: AOx3  Laboratory:  Recent Labs Lab 04/23/17 0613 04/23/17 1341 04/24/17 0354 04/25/17 0456  WBC 4.5  --  4.2 3.3*  HGB 7.1* 6.3* 7.8* 7.5*  HCT 21.0* 18.4* 23.2* 21.7*  PLT 44*  --  33* 30*    Recent Labs Lab 04/21/17 1658 04/22/17 0500  04/23/17 0613 04/23/17 1341 04/24/17 0354 04/25/17 0456  NA 130* 131*  --  130*  --  131* 133*  K 5.9* 6.4*  < > 5.2*  5.2* 4.8 4.7 4.5  CL 107 108  --  108  --  105 106  CO2 13* 14*  --  14*  --  16* 16*  BUN 56* 59*  --  62*  --  60* 63*  CREATININE 3.72* 3.64*  --  3.55*  --  3.34* 3.32*  CALCIUM 9.1 8.6*  --  8.8*  --  8.7* 8.4*  PROT 6.6 5.8*  --  5.9*  --   --   --   BILITOT 2.9* 3.3*  --  3.4*  --   --   --   ALKPHOS 85 75  --  63  --   --   --   ALT 38 37  --  27  --   --   --   AST 62* 52*  --  42*  --   --   --   GLUCOSE 133* 102*  < > 95  --  97 103*  < > = values in this interval not displayed.  Body fluid - WBC 465 - Neurophils 35% - gram stain neg - culture pending  Imaging/Diagnostic Tests: Ct Abdomen Pelvis Wo Contrast  Result Date: 04/21/2017 IMPRESSION: 1. Large volume ascites noted throughout the abdomen and pelvis. Fluid extends into a tiny right inguinal hernia. 2. Findings of hepatic cirrhosis. Scattered splenic and gastric varices noted. 3. Splenomegaly. 4. Scattered aortic atherosclerosis. 5. Diffuse coronary artery calcification noted. 6. Nonobstructing right renal stones measure up to 7 mm size. Electronically Signed   By: Garald Balding M.D.   On: 04/21/2017 21:33   Bufford Lope, DO 04/25/2017, 7:20 AM PGY-1, [facility] Intern  pager: [phone], text pages welcome

## 2017-04-25 NOTE — Discharge Summary (Signed)
[facility] Discharge Summary  Patient name: Vincent Ochoa Medical record number: [id number] Date of birth: [birth year] Age: 61 y.o. Gender: male Date of Admission: 04/21/2017  Date of Discharge: 04/28/2017 Admitting Physician: Alveda Reasons, MD  Primary Care Provider: Bufford Lope, DO Consultants: Renal, GI  Indication for Hospitalization: hematemesis, abdominal pain  Discharge Diagnoses/Problem List:  Active Problems:   Acute upper GI bleed   Lower GI bleed   Acute kidney failure   End-stage liver cirrhosis   HRS   Malnutrition of moderate degree   External and internal hemorrhoids  Disposition: Transfer to [facility]  Discharge Condition: Stable  Discharge Exam:  Blood pressure (!) 96/45, pulse 82, temperature 99.3 F (37.4 C), resp. rate 18, height 6\' 3"  (1.905 m), weight 228 lb 9.9 oz (103.7 kg), SpO2 99 %. Physical Exam: General: Chronically ill appearing man, jaundiced, NAD Eyes: subconjunctival hemorrhage of left eye Cardiovascular: RRR, S1, S2, no m/r/g Respiratory: CTAB, normal WOB Abdomen: distended, + fluid wave, soft, moderately TTP throughout Extremities: Trace LE edema Neuro: AOx3  Brief Hospital Course:  Vincent Ochoa a 61 y.o.malewho presented with bright red vomitus and abdominal pain. PMH is significant for cirrhosis of liver with recurrent ascites (followed by [facility], Dr. Monica Martinez; MELD score of 31), CAD (s/p BMS to mid cx in 2015), psoriasis, thrombocytopenia (followed by [facility]), OSA, knee pain, obesity, GERD, Depression, PAF, HTN, esophageal varices, and anemia of chronic disease.  Patient had 2 episodes of hematemesis the afternoon of admission. Once admitted, he had no further episodes. He has history of hypotension and was at baseline (100s/60s) with normal mentation. Hemoglobin was stable at 9.3 (BL ~ 9.0). Gastroenterology was consulted, and EGD performed on 04/22/17 without active bleeding but solitary grade 2  esophageal varix banded and possible grade 1 that was unable to be banded. Patient also had been having BRBPR since discharge from [facility] May 18th after he had colonoscopy, which showed multiple polyps that were removed from descending and sigmoid colon, as well as rectum. Hemoglobin dropped to 7.1 then 6.3 on 6/3, and patient was given 2U transfusion. Hemoglobin stabilized in mid-7s. He was initially started on IV protonix, octreotide, and ceftriaxone. He was transitioned to PO protonix, ciprofloxacin and SQ octreotide.   Nephrology was consulted for acute renal failure, with concern for HRS vs prerenal azotemia. SCr was 3.72 on admission compared to baseline ~ 2.00 in May. Improved slightly to 3.23. He was started on sodium bicarbonate. Midodrine 15mg  TID continued.   Regarding abdominal pain, patient had paracentesis 6/4 with 10L of fluid removed. Cell count showed WBC 465, 35% neurptrophils. Neg gram stain. This could represent partially treated SPEP, though low suspicion without fever or increasing abdominal pain. Lactulose and prophylactic antibiotics continued.   Hospitalization was also complicated by hyperkalemia, likely in setting of acute renal failure. Corrected by time of transfer.   [facility] was contacted about patient's admission and recommended transfer to facilitate ongoing care towards process of liver transplant. When bed was available, patient was transferred.   Issues for Follow Up:  1. Assistance with affording medication (could not afford rifaximin or octreotide upon last hospital discharge in May) 2. Would restart rifaximin  3.   Nephrology follow-up  Significant Procedures: EGD 04/22/17; Paracentesis 04/24/17  Significant Labs and Imaging:   Recent Labs Lab 04/25/17 0456 04/26/17 0720 04/27/17 0359  WBC 3.3* 3.3* 4.7  HGB 7.5* 8.0* 8.8*  HCT 21.7* 23.2* 26.2*  PLT 30* 35* 39*  Recent Labs Lab 04/21/17 1658 04/22/17 0500  04/22/17 1350  04/23/17 0277  04/24/17 0354  04/25/17 0456 04/26/17 0720 04/27/17 0359  NA 130* 131*  --   --   --  130*  --  131* 133* 134* 137  K 5.9* 6.4*  < > 5.4*  < > 5.2*  5.2*  < > 4.7 4.5 4.8 5.2*  CL 107 108  --   --   --  108  --  105 106 105 106  CO2 13* 14*  --   --   --  14*  --  16* 16* 17* 19*  GLUCOSE 133* 102*  --  105*  --  95  --  97 103* 110* 110*  BUN 56* 59*  --   --   --  62*  --  60* 63* 68* 72*  CREATININE 3.72* 3.64*  --   --   --  3.55*  --  3.34* 3.32* 3.12* 3.23*  CALCIUM 9.1 8.6*  --   --   --  8.8*  --  8.7* 8.4* 8.6* 9.0  MG  --   --   --  1.8  --   --   --   --   --   --   --   PHOS  --   --   --   --   --   --   --   --  3.9  --   --   ALKPHOS 85 75  --   --   --  63  --   --   --   --   --   AST 62* 52*  --   --   --  42*  --   --   --   --   --   ALT 38 37  --   --   --  27  --   --   --   --   --   ALBUMIN 3.5 3.1*  --   --   --  4.0  --   --  3.8  --   --   < > = values in this interval not displayed.  Ct Abdomen Pelvis Wo Contrast  Result Date: 04/21/2017 CLINICAL DATA:  Acute onset right lower quadrant abdominal pain, rectal bleeding and hematemesis. Initial encounter. EXAM: CT ABDOMEN AND PELVIS WITHOUT CONTRAST TECHNIQUE: Multidetector CT imaging of the abdomen and pelvis was performed following the standard protocol without IV contrast. COMPARISON:  CT of the abdomen and pelvis performed 03/29/2017 FINDINGS: Lower chest: Diffuse coronary artery calcifications are seen. Mild bibasilar atelectasis is noted. Hepatobiliary: There is a diffusely nodular contour to the liver, compatible with hepatic cirrhosis. The patient is status post cholecystectomy, with clips noted at the gallbladder fossa. The common bile duct is grossly unremarkable in appearance. Pancreas: The pancreas is within normal limits. Spleen: The spleen is enlarged, measuring 15.8 cm in length. Scattered splenic and gastric varices are seen. Adrenals/Urinary Tract: The adrenal glands are difficult to fully assess but appear grossly  unremarkable. Nonobstructing right renal stones measure up to 7 mm in size. The kidneys are otherwise unremarkable. There is no hydronephrosis. No obstructing ureteral stones are identified. No perinephric stranding is seen. Stomach/Bowel: The stomach is unremarkable in appearance. The small bowel is within normal limits. The appendix is not visualized; there is no evidence for appendicitis. The colon is unremarkable in appearance. Vascular/Lymphatic: Scattered calcification is seen along the abdominal aorta  and its branches. The abdominal aorta is otherwise grossly unremarkable. The inferior vena cava is grossly unremarkable. No retroperitoneal lymphadenopathy is seen. No pelvic sidewall lymphadenopathy is identified. Reproductive: The bladder is mildly distended and grossly unremarkable. The prostate remains normal in size. Other: Large volume ascites is noted throughout the abdomen and pelvis. Fluid extends into a tiny right inguinal hernia. Musculoskeletal: No acute osseous abnormalities are identified. Mild facet disease is noted at the lower lumbar spine. The visualized musculature is unremarkable in appearance. IMPRESSION: 1. Large volume ascites noted throughout the abdomen and pelvis. Fluid extends into a tiny right inguinal hernia. 2. Findings of hepatic cirrhosis. Scattered splenic and gastric varices noted. 3. Splenomegaly. 4. Scattered aortic atherosclerosis. 5. Diffuse coronary artery calcification noted. 6. Nonobstructing right renal stones measure up to 7 mm size. Electronically Signed   By: Garald Balding M.D.   On: 04/21/2017 21:33    Ir Paracentesis  Result Date: 04/24/2017 INDICATION: Cirrhosis. Abdominal distention secondary to recurrent ascites. Request therapeutic paracentesis up to 10 L max. EXAM: ULTRASOUND GUIDED LEFT LOWER QUADRANT PARACENTESIS MEDICATIONS: None. COMPLICATIONS: None immediate. PROCEDURE: Informed written consent was obtained from the patient after a discussion of the  risks, benefits and alternatives to treatment. A timeout was performed prior to the initiation of the procedure. Initial ultrasound scanning demonstrates a large amount of ascites within the left lower abdominal quadrant. The left lower abdomen was prepped and draped in the usual sterile fashion. 1% lidocaine with epinephrine was used for local anesthesia. Following this, a 19 gauge, 7-cm, Yueh catheter was introduced. An ultrasound image was saved for documentation purposes. The paracentesis was performed. The catheter was removed and a dressing was applied. The patient tolerated the procedure well without immediate post procedural complication. FINDINGS: A total of approximately 10 L of clear yellow fluid was removed. Samples were sent to the laboratory as requested by the clinical team. IMPRESSION: Successful ultrasound-guided paracentesis yielding 10 liters of peritoneal fluid. Read by: Ascencion Dike PA-C Electronically Signed   By: Marybelle Killings M.D.   On: 04/24/2017 16:24    Results/Tests Pending at Time of Discharge:  None  Discharge Medications:  Allergies as of 04/28/2017      Reactions   Asa [aspirin] Other (See Comments)   Other reaction(s): Other (See Comments) Previous hemmorhage Bleeding Previous hemmorhage   Eliquis [apixaban] Other (See Comments)   bleeding Potentiality of internal bleeding   Nsaids Other (See Comments), Anaphylaxis   May cause bleeding issues (per wife) May only have 1 plain Tylenol every 8 hours is needed for flu symptoms, per doctor.   Plavix [clopidogrel] Other (See Comments)   Bleeding Potentiality of internal bleeding   Statins Other (See Comments)   Other reaction(s): Other (See Comments) Potentially may make patient bleed internally (per wife) Potentially may make patient bleed internally (per wife)   Tolmetin    Other reaction(s): Other (See Comments) May cause bleeding issues (per wife) May only have 1 plain Tylenol every 8 hours is needed for flu  symptoms, per doctor.      Medication List    STOP taking these medications   pramoxine 1 % foam Commonly known as:  PROCTOFOAM     TAKE these medications   buPROPion 150 MG 24 hr tablet Commonly known as:  WELLBUTRIN XL Take 1 tablet (150 mg total) by mouth every other day.   ciprofloxacin 500 MG tablet Commonly known as:  CIPRO Take 500 mg by mouth daily.   dibucaine 1 %  ointment Commonly known as:  NUPERCAINAL Apply topically 3 (three) times daily as needed for pain.   docusate sodium 100 MG capsule Commonly known as:  COLACE Take 1 capsule (100 mg total) by mouth 2 (two) times daily.   hydrocortisone 25 MG suppository Commonly known as:  ANUSOL-HC Place 25 mg rectally 2 (two) times daily as needed.   hydrocortisone-pramoxine rectal foam Commonly known as:  PROCTOFOAM-HC Place 1 applicator rectally 3 (three) times daily.   Lactulose 20 GM/30ML Soln Take 45 mLs (30 g total) by mouth 4 (four) times daily.   midodrine 5 MG tablet Commonly known as:  PROAMATINE Take 15 mg by mouth 3 (three) times daily.   MULTIVITAMIN GUMMIES ADULT Chew Chew 1 tablet by mouth daily.   octreotide 50 MCG/ML Soln injection Commonly known as:  SANDOSTATIN Inject 4 mLs (200 mcg total) into the skin 3 (three) times daily.   octreotide 100 MCG/ML Soln injection Commonly known as:  SANDOSTATIN Inject 2 mLs (200 mcg total) into the skin 3 (three) times daily.   pantoprazole 40 MG tablet Commonly known as:  PROTONIX Take 1 tablet (40 mg total) by mouth 2 (two) times daily.   promethazine 12.5 MG tablet Commonly known as:  PHENERGAN Take 0.5 tablets (6.25 mg total) by mouth every 6 (six) hours as needed for nausea or vomiting.   rifaximin 550 MG Tabs tablet Commonly known as:  XIFAXAN Take 1 tablet (550 mg total) by mouth 2 (two) times daily.   saccharomyces boulardii 250 MG capsule Commonly known as:  FLORASTOR Take 1 capsule (250 mg total) by mouth 2 (two) times daily.    shark liver oil-cocoa butter 0.25-3-85.5 % suppository Commonly known as:  PREPARATION H Place 1 suppository rectally as needed for hemorrhoids.   sodium bicarbonate 650 MG tablet Take 3 tablets (1,950 mg total) by mouth 3 (three) times daily.       Discharge Instructions: Please refer to Patient Instructions section of EMR for full details.  Patient was counseled important signs and symptoms that should prompt return to medical care, changes in medications, dietary instructions, activity restrictions, and follow up appointments.   Follow-Up Appointments: Follow-up Information    Bufford Lope, DO. Schedule an appointment as soon as possible for a visit.   Why:  Please call for hospital follow-up appointment. Contact information: [place] [address] [phone]        [facility] Follow up.   Contact information: [address] [place] [address] [phone]           Future Appointments Date Time Provider [place]  05/02/2017 9:00 AM [facility] 3 [facility]   Follow-up Information    PCP Bufford Lope, DO. Please call for appointment.  Why:  hospital follow-up Contact information: [place] [address] [phone]    Verner Mould, MD 04/28/2017, 12:12 AM PGY-2, [place]

## 2017-04-25 NOTE — Care Management Important Message (Signed)
Important Message  Patient Details  Name: Vincent Ochoa MRN: [id number] Date of Birth: 1956-07-25   Medicare Important Message Given:  Yes    Nathen May 04/25/2017, 10:25 AM

## 2017-04-25 NOTE — Progress Notes (Signed)
Responded to [place].  This patient will visited by [employer] team.  Chaplain available as needed.  Jaclynn Major, [place], [facility], Pager [phone]

## 2017-04-25 NOTE — Progress Notes (Signed)
Hemoglobin essentially stable, patient reports moderate amount of old blood with large stool today. Off octreotide infusion approximately 24 hours. The patient is on subcutaneous octreotide as was started at [facility], although I'm not clear on the reason for this.  Patient had 10 L paracentesis yesterday, with absolute neutrophil count (following antibiotic therapy) less than 250; in my opinion, this could be partially treated SPEP, more likely, is noninfected fluid.  Patient's abdomen feels much better since removal of the fluid, although it is still quite distended.  Patient is coherent and without overt encephalopathy.  Patient and wife indicate that they did have a meeting with the dietitian about sodium restriction.  Renal function shows progressive, albeit minimal, improvement since admission, although it is still worse than it was a month ago.  Patient's chief complaint at this time is hemorrhoidal pain, made somewhat better by the mesalamine suppository that was prescribed yesterday. The patient's wife indicates that they were told to [facility] that no operative intervention can be undertaken on the hemorrhoid, in view of his overall medical status.  Recommendations:  1. Okay for discharge from GI bleeding standpoint 2. In my opinion, this patient should have early follow up (within the next week or so) with the liver transplant team at [facility] 3. Recommended digital reduction of the hemorrhoid when it prolapses, advised patient and wife how to do this.  Would also continue Proctor foam and mesalamine suppository.   Please call me if any questions.  Cleotis Nipper, M.D. Pager [phone] If no answer or after 5 PM call [phone]

## 2017-04-25 NOTE — Care Management Important Message (Signed)
Important Message  Patient Details  Name: Vincent Ochoa MRN: [id number] Date of Birth: [birth year]   Medicare Important Message Given:  Yes    Krystine Pabst Abena 04/25/2017, 10:26 AM

## 2017-04-26 DIAGNOSIS — K767 Hepatorenal syndrome: Secondary | ICD-10-CM

## 2017-04-26 LAB — BASIC METABOLIC PANEL
Anion gap: 12 (ref 5–15)
BUN: 68 mg/dL — AB (ref 6–20)
CO2: 17 mmol/L — ABNORMAL LOW (ref 22–32)
Calcium: 8.6 mg/dL — ABNORMAL LOW (ref 8.9–10.3)
Chloride: 105 mmol/L (ref 101–111)
Creatinine, Ser: 3.12 mg/dL — ABNORMAL HIGH (ref 0.61–1.24)
GFR calc Af Amer: 23 mL/min — ABNORMAL LOW (ref >60)
GFR, EST NON AFRICAN AMERICAN: 20 mL/min — AB (ref >60)
Glucose, Bld: 110 mg/dL — ABNORMAL HIGH (ref 65–99)
Potassium: 4.8 mmol/L (ref 3.5–5.1)
SODIUM: 134 mmol/L — AB (ref 135–145)

## 2017-04-26 LAB — CBC
HCT: 23.2 % — ABNORMAL LOW (ref 39.0–52.0)
Hemoglobin: 8 g/dL — ABNORMAL LOW (ref 13.0–17.0)
MCH: 30.5 pg (ref 26.0–34.0)
MCHC: 34.5 g/dL (ref 30.0–36.0)
MCV: 88.5 fL (ref 78.0–100.0)
PLATELETS: 35 10*3/uL — AB (ref 150–400)
RBC: 2.62 MIL/uL — AB (ref 4.22–5.81)
RDW: 15.4 % (ref 11.5–15.5)
WBC: 3.3 10*3/uL — ABNORMAL LOW (ref 4.0–10.5)

## 2017-04-26 NOTE — Progress Notes (Signed)
Pt's bed alarm going off, pt already in bathroom when nurse arrived in room. Pt refuses for nurse to help him back to bed and refuses bed alarm. Pt states he is not a high fall risk. Notified Sonia, Therapist, sports of situation. Will continue to monitor pt. Ranelle Oyster, RN (Agricultural consultant)

## 2017-04-26 NOTE — Progress Notes (Signed)
[facility] Progress Note  Vincent Ochoa 61 y.o. August 21, 1956  CC:  GI bleed.   Subjective: No further episodes of bleeding. Denied any blood per rectum. Hemoglobin stable. Tolerating diet. Continues to complain of abdominal distention despite of 10 L paracentesis  ROS : Negative for chest pain and shortness of breath.   Objective: Vital signs in last 24 hours: Vitals:   04/25/17 2207 04/26/17 0657  BP: (!) 106/55 (!) 92/41  Pulse: 78 79  Resp: 20 20  Temp: 98.6 F (37 C) 99.1 F (37.3 C)    Physical Exam:  General:  Alert, cooperative, no distress, appears stated age  Head:  Normocephalic, without obvious abnormality, atraumatic  Eyes:  , EOM's intact,   Lungs:   Clear to auscultation bilaterally, respirations unlabored  Heart:  Regular rate and rhythm, S1, S2 normal  Abdomen:   Abdomen is distended non-tender, bowel sounds active all four quadrants,  no masses,   Extremities: Extremities normal, atraumatic, trace  edema       Lab Results:  Recent Labs  04/25/17 0456 04/26/17 0720  NA 133* 134*  K 4.5 4.8  CL 106 105  CO2 16* 17*  GLUCOSE 103* 110*  BUN 63* 68*  CREATININE 3.32* 3.12*  CALCIUM 8.4* 8.6*  PHOS 3.9  --     Recent Labs  04/25/17 0456  ALBUMIN 3.8    Recent Labs  04/25/17 0456 04/26/17 0720  WBC 3.3* 3.3*  HGB 7.5* 8.0*  HCT 21.7* 23.2*  MCV 87.9 88.5  PLT 30* 35*   No results for input(s): LABPROT, INR in the last 72 hours.    Assessment/Plan: - Hematemesis. EGD on 04/22/2017 showed small esophageal varices and portal gastropathy. Status post band ligation. Resolved. Hemoglobin stable now. - Decompensated cirrhosis. MELD 30. Followed by [facility] for possible liver transplant/ not listed yet. - Ascites. Status post paracentesis with removal of 10 L of fluid on 04/24/2017 . Negative for SBP. - Acute on chronic kidney disease. - Possible hemorrhoids.  Recommendations --------------------------- - No further  evidence of bleeding. Hemoglobin stable. 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 Watt Climes.  - GI will follow if patient remains in the hospital.    Otis Brace MD, [place] 04/26/2017, 9:15 AM  Pager [phone]  If no answer or after 5 PM call [phone]

## 2017-04-26 NOTE — Progress Notes (Addendum)
  Roselind Messier, RN        # 5.  S/W MONIQUE @ [employer] [facility] # [phone]   OCTREOTIDE  200 MCG SQ 3 X A DAY   COVER- YES  CO-PAY- $ 27.06  PRIOR APPROVAL - NO   PREFERRED PHARMACY : [facility], [facility] AND [facility]   Previous Messages    ----- Message -----  From: Cecille Rubin, RN  Sent: 04/26/2017 12:12 PM  To: [facility] C[facility] Case Mgr Assistant  Subject: BENEFITS CHECK                  Please check copay octreotide 263mcg subcutaneous 3x a day. Thanks      CM made pt aware. Whitman Hero RN,BSN,CM [phone]

## 2017-04-26 NOTE — Progress Notes (Signed)
Family Medicine Teaching Service Daily Progress Note Intern Pager: [phone]  Patient name: Vincent Ochoa Medical record number: [id number] Date of birth: Feb 17, 1956 Age: 61 y.o. Gender: male  Primary Care Provider: Bufford Lope, DO Consultants: GI, nephrology Code Status: Full  Pt Overview and Major Events to Date:  6/1 - Admitted for hematemesis  Assessment and Plan: Vincent Ochoa is a 61 y.o. male presenting with bright red vomitus and abdominal pain. PMH is significant for cirrhosis of liver with recurrent ascites (followed by [facility] hepatology), CAD (stent), psoriasis, thrombocytopenia (followed by Heme), OSA, knee pain, obesity, GERD, Depression, PAF, HTN, esophageal varices, anemia of chronic disease  Liver Cirrhosis. MELD score 31 (52.6% mortality risk in next 3 months). Followed by [facility] Hepatology and planning for possible liver transplant. Blood pressure unchanged and stable in 90/40s. Abdominal pain improved s/p paracentesis 04/24/17 and labs not consistent with SBP. Patient afebrile. - When [facility] of admission yesterday, recommended transfer to expedite liver transplant process especially given concern for HRS but if stable and meeting discharge criteria okay for DC home w/close follow up. No beds available currently.  - daily cipro for SBP prophylaxis - lactulose 30 g BID - continue subcutaneous octreotide and oral midodrine 15 mg TID - Monitor abdominal pain, IV fentanyl 25 mg q6h prn  Anemia in setting of hematemesis/hematochezia, stable. No hematemesis since admission. Hgb stable at 8.0 today s/p 2U RBCs earlier this hospital stay (BL ~ 9.0).  - per GI, okay to discharge with close follow up but will continue to follow if stays admitted. - Continue octreotide and PO cipro for infection prophylaxis  - IV protonix - IV phenergan 12.5 mg q6h - monitor Hbg  AKI on CKD3 vs new kidney function baseline. SCr elevated to 3.72 on from new BL ~ 2.0. Mildly improved at 3.12 and stable  today, may be new baseline  - CMP daily - Per Nephrology, signed off. Continue supportive management with octreotide/midodrine. Needs octreotide on discharge. - no acute need for HD   Hemorrhoids. More comfortable today. Chronic, worsened by recent colonoscopy, per patient - Proctofoam and anusol suppository ordered prn for comfort. Mesalamine suppository added.  GERD:Stable. At home on Protonix 40 mg po BID and sucralfate 1g PO TID.  - IV PPI  Vincent Ochoa. Echo 11/23/2016 with EF 50-55%. No signs of edema or crackles. Lasix and spiro discontinued at last admission to [facility] 4/10-4/14.  - Monitor for signs of fluid overload on exam  CAD:S/p Left heart cath July 2016 showing nonobstructive coronary artery disease. Mid cx lesion previously treated with a bare metal stent in 10/2014. Follows with Dr. Burt Knack outpatient.   Thrombocytopenia:Stable. At home on Promacta 100 mg po daily.  - Monitor  Vincent Ochoa. To be using CPAP but awaiting equipment.   - CPAP qhs  Depression:Stable. At home on Wellbutrin.  - wellbutrin 150 mg po daily   PAF:On problem list. Not on pharmacologic treatment currently given history of bleeds.   Hyperkalemia, resolved.  - Continue to monitor - continue lactulose/kayexalate   FEN/GI: heart healthy/carb mod  Prophylaxis: SCDs  Disposition: Pending bed availability at [facility]  Subjective:  No fever/chills.  No hematemesis since admission. States hemorrhoids improving. Patient states he lives 1.5hr from a hospital so would prefer to stay admitted until transfer as opposed to discharge home with close follow up.  Objective: Temp:  [98.6 F (37 C)-99.1 F (37.3 C)] 99.1 F (37.3 C) (06/06 0657) Pulse Rate:  [78-81] 79 (06/06 0657) Resp:  [  19-20] 20 (06/06 0657) BP: (92-106)/(41-55) 92/41 (06/06 0657) SpO2:  [96 %-100 %] 100 % (06/06 0657) Physical Exam: General: Chronically ill appearing man, jaundiced, NAD Eyes: subconjunctival  hemorrhage of left eye Cardiovascular: RRR, S1, S2, no m/r/g Respiratory: CTAB, normal WOB Abdomen: distended, + fluid wave, soft, moderately TTP throughout Extremities: Trace LE edema Neuro: AOx3  Laboratory:  Recent Labs Lab 04/24/17 0354 04/25/17 0456 04/26/17 0720  WBC 4.2 3.3* 3.3*  HGB 7.8* 7.5* 8.0*  HCT 23.2* 21.7* 23.2*  PLT 33* 30* 35*    Recent Labs Lab 04/21/17 1658 04/22/17 0500  04/23/17 0613  04/24/17 0354 04/25/17 0456 04/26/17 0720  NA 130* 131*  --  130*  --  131* 133* 134*  K 5.9* 6.4*  < > 5.2*  5.2*  < > 4.7 4.5 4.8  CL 107 108  --  108  --  105 106 105  CO2 13* 14*  --  14*  --  16* 16* 17*  BUN 56* 59*  --  62*  --  60* 63* 68*  CREATININE 3.72* 3.64*  --  3.55*  --  3.34* 3.32* 3.12*  CALCIUM 9.1 8.6*  --  8.8*  --  8.7* 8.4* 8.6*  PROT 6.6 5.8*  --  5.9*  --   --   --   --   BILITOT 2.9* 3.3*  --  3.4*  --   --   --   --   ALKPHOS 85 75  --  63  --   --   --   --   ALT 38 37  --  27  --   --   --   --   AST 62* 52*  --  42*  --   --   --   --   GLUCOSE 133* 102*  < > 95  --  97 103* 110*  < > = values in this interval not displayed.  Body fluid - WBC 465 - Neurophils 35% - gram stain neg - culture pending  Imaging/Diagnostic Tests: Ct Abdomen Pelvis Wo Contrast  Result Date: 04/21/2017 IMPRESSION: 1. Large volume ascites noted throughout the abdomen and pelvis. Fluid extends into a tiny right inguinal hernia. 2. Findings of hepatic cirrhosis. Scattered splenic and gastric varices noted. 3. Splenomegaly. 4. Scattered aortic atherosclerosis. 5. Diffuse coronary artery calcification noted. 6. Nonobstructing right renal stones measure up to 7 mm size. Electronically Signed   By: Vincent Ochoa M.D.   On: 04/21/2017 21:33   Vincent Lope, DO 04/26/2017, 9:44 AM PGY-1, [place] Intern pager: [phone], text pages welcome

## 2017-04-26 NOTE — Progress Notes (Signed)
Benefits check in process for octreotide 239mcg sq tid. CM to f/u with results. Whitman Hero RN,BSN,CM [phone]

## 2017-04-27 ENCOUNTER — Telehealth: Payer: Self-pay | Admitting: Family Medicine

## 2017-04-27 DIAGNOSIS — K7682 Hepatic encephalopathy: Secondary | ICD-10-CM

## 2017-04-27 DIAGNOSIS — K7290 Hepatic failure, unspecified without coma: Secondary | ICD-10-CM

## 2017-04-27 LAB — CBC
HEMATOCRIT: 26.2 % — AB (ref 39.0–52.0)
HEMOGLOBIN: 8.8 g/dL — AB (ref 13.0–17.0)
MCH: 30 pg (ref 26.0–34.0)
MCHC: 33.6 g/dL (ref 30.0–36.0)
MCV: 89.4 fL (ref 78.0–100.0)
Platelets: 39 10*3/uL — ABNORMAL LOW (ref 150–400)
RBC: 2.93 MIL/uL — ABNORMAL LOW (ref 4.22–5.81)
RDW: 15.7 % — ABNORMAL HIGH (ref 11.5–15.5)
WBC: 4.7 10*3/uL (ref 4.0–10.5)

## 2017-04-27 LAB — BASIC METABOLIC PANEL
ANION GAP: 12 (ref 5–15)
BUN: 72 mg/dL — ABNORMAL HIGH (ref 6–20)
CHLORIDE: 106 mmol/L (ref 101–111)
CO2: 19 mmol/L — AB (ref 22–32)
CREATININE: 3.23 mg/dL — AB (ref 0.61–1.24)
Calcium: 9 mg/dL (ref 8.9–10.3)
GFR calc non Af Amer: 19 mL/min — ABNORMAL LOW (ref >60)
GFR, EST AFRICAN AMERICAN: 22 mL/min — AB (ref >60)
Glucose, Bld: 110 mg/dL — ABNORMAL HIGH (ref 65–99)
Potassium: 5.2 mmol/L — ABNORMAL HIGH (ref 3.5–5.1)
Sodium: 137 mmol/L (ref 135–145)

## 2017-04-27 MED ORDER — RIFAXIMIN 550 MG PO TABS
550.0000 mg | ORAL_TABLET | Freq: Two times a day (BID) | ORAL | Status: DC
  Administered 2017-04-27 (×2): 550 mg via ORAL
  Filled 2017-04-27 (×2): qty 1

## 2017-04-27 NOTE — Progress Notes (Signed)
Family Medicine Teaching Service Daily Progress Note Intern Pager: [phone]  Patient name: Vincent Ochoa Medical record number: [id number] Date of birth: May 16, 1956 Age: 61 y.o. Gender: male  Primary Care Provider: Bufford Lope, DO Consultants: GI, nephrology Code Status: Full  Pt Overview and Major Events to Date:  6/1 - Admitted for hematemesis  Assessment and Plan: Vincent Ochoa is a 61 y.o. male presenting with bright red vomitus and abdominal pain. PMH is significant for cirrhosis of liver with recurrent ascites (followed by [facility] hepatology), CAD (stent), psoriasis, thrombocytopenia (followed by [facility]), OSA, knee pain, obesity, GERD, Depression, PAF, HTN, esophageal varices, anemia of chronic disease  Liver Cirrhosis. MELD score 31 (52.6% mortality risk in next 3 months). Followed by [facility] Hepatology, not yet on transplant list. Blood pressure stable. Afebrile and no concern for SBP. Patient mildly confused today. [facility] planning for transfer since patient needs cath for pretransplant evaluation but also okay for DC home w/close follow up if meeting discharge criteria. Unlikely to be transferred today, no beds currently available.   - daily cipro for SBP prophylaxis - lactulose 30 g BID - restarted rifaximin 550mg  BID today - continue subcutaneous octreotide and oral midodrine 15 mg TID - Monitor abdominal pain, IV fentanyl 25 mg q6h prn, has not needed in >24h  Anemia in setting of hematemesis/hematochezia, stable. No hematemesis since admission. Hgb stable at 8.8  (BL ~ 9.0).  - per GI, okay to discharge with close follow up but will continue to follow if stays admitted. - IV protonix - PO phenergan 12.5 mg q6h - monitor Hbg  AKI on CKD3 vs new kidney function baseline, stable. SCr 3.23  - CMP daily - Per Nephrology, signed off. Continue supportive management with octreotide/midodrine.  - no acute need for HD   Hemorrhoids. More comfortable today. Chronic, worsened by recent  colonoscopy, per patient - Proctofoam and anusol suppository ordered prn for comfort. Mesalamine suppository added.  GERD:Stable. At home on Protonix 40 mg po BID and sucralfate 1g PO TID.  - IV PPI  CZY:SAYTKZ. Echo 11/23/2016 with EF 50-55%. No signs of edema or crackles. Lasix and spiro discontinued at last admission to [facility] 4/10-4/14.  - Monitor for signs of fluid overload on exam  CAD:S/p Left heart cath July 2016 showing nonobstructive coronary artery disease. Mid cx lesion previously treated with a bare metal stent in 10/2014. Follows with Dr. Burt Knack outpatient.   Thrombocytopenia:Stable. At home on Promacta 100 mg po daily.  - Monitor  SWF:UXNATF. To be using CPAP but awaiting equipment.   - CPAP qhs  Depression:Stable. At home on Wellbutrin.  - wellbutrin 150 mg po daily   PAF:On problem list. Not on pharmacologic treatment currently given history of bleeds.   Hyperkalemia. Mild at 5.2 - Continue to monitor - continue lactulose  FEN/GI: heart healthy/carb mod  Prophylaxis: SCDs  Disposition: likely DC tomorrow vs awaiting [facility] transfer  Subjective:  No fever/chills.  No hematemesis since admission. States hemorrhoids improved. Per patient's wife who spoke with [facility] today about transplant meeting yesterday, patient is still not on transplant list and needs a cardiac cath but patient's kidneys may not be able to handle contrast load so is still on transfer list to [facility]. Patient's wife is also concerned that patient is mildly confused this morning because he has been hesitant to take his lactulose from the increased BMs irritating his hemorrhoids.  Objective: Temp:  [98.1 F (36.7 C)-98.6 F (37 C)] 98.6 F (37 C) (06/07  4132) Pulse Rate:  [82-88] 88 (06/07 0613) Resp:  [16-18] 18 (06/07 0613) BP: (96-111)/(57-65) 102/65 (06/07 0613) SpO2:  [99 %-100 %] 100 % (06/07 0613) Weight:  [102.2 kg (225 lb 5 oz)] 102.2 kg (225 lb 5 oz) (06/07  0027) Physical Exam: General: Chronically ill appearing man, jaundiced, NAD Eyes: subconjunctival hemorrhage of left eye Cardiovascular: RRR, S1, S2, no m/r/g Respiratory: CTAB, normal WOB Abdomen: distended, + fluid wave, soft, moderately TTP throughout Extremities: Trace LE edema Neuro: AOx3  Laboratory:  Recent Labs Lab 04/25/17 0456 04/26/17 0720 04/27/17 0359  WBC 3.3* 3.3* 4.7  HGB 7.5* 8.0* 8.8*  HCT 21.7* 23.2* 26.2*  PLT 30* 35* 39*    Recent Labs Lab 04/21/17 1658 04/22/17 0500  04/23/17 0613  04/25/17 0456 04/26/17 0720 04/27/17 0359  NA 130* 131*  --  130*  < > 133* 134* 137  K 5.9* 6.4*  < > 5.2*  5.2*  < > 4.5 4.8 5.2*  CL 107 108  --  108  < > 106 105 106  CO2 13* 14*  --  14*  < > 16* 17* 19*  BUN 56* 59*  --  62*  < > 63* 68* 72*  CREATININE 3.72* 3.64*  --  3.55*  < > 3.32* 3.12* 3.23*  CALCIUM 9.1 8.6*  --  8.8*  < > 8.4* 8.6* 9.0  PROT 6.6 5.8*  --  5.9*  --   --   --   --   BILITOT 2.9* 3.3*  --  3.4*  --   --   --   --   ALKPHOS 85 75  --  63  --   --   --   --   ALT 38 37  --  27  --   --   --   --   AST 62* 52*  --  42*  --   --   --   --   GLUCOSE 133* 102*  < > 95  < > 103* 110* 110*  < > = values in this interval not displayed.  Body fluid - WBC 465 - Neurophils 35% - gram stain neg - culture NGTD  Imaging/Diagnostic Tests: Ct Abdomen Pelvis Wo Contrast  Result Date: 04/21/2017 IMPRESSION: 1. Large volume ascites noted throughout the abdomen and pelvis. Fluid extends into a tiny right inguinal hernia. 2. Findings of hepatic cirrhosis. Scattered splenic and gastric varices noted. 3. Splenomegaly. 4. Scattered aortic atherosclerosis. 5. Diffuse coronary artery calcification noted. 6. Nonobstructing right renal stones measure up to 7 mm size. Electronically Signed   By: Garald Balding M.D.   On: 04/21/2017 21:33   Bufford Lope, DO 04/27/2017, 7:15 AM PGY-1, [place] Intern pager: [phone], text pages welcome

## 2017-04-27 NOTE — Progress Notes (Signed)
Chief complaint is feeling "tired."   Patient appears more comfortable and states that he is more comfortable. In particular, his hemorrhoidal irritation is improved.   His hemoglobin is coming up.   His renal function is slightly worse today compared to yesterday, but overall, there is no trend toward worsening.  He is awaiting a bed at [facility]. No new suggestions from my standpoint. Although he is medically okay for discharge, his condition is tenuous because of his marginal renal function and his overall poor functional status, so it would not be unreasonable to keep him in-house (his preference) especially if it is anticipated that a bed will become available at [facility] in the next couple of days.  Cleotis Nipper, M.D. Pager [phone] If no answer or after 5 PM call [phone]

## 2017-04-27 NOTE — Procedures (Signed)
Patient refused CPAP. Stated he does not wear one at home.

## 2017-04-27 NOTE — Telephone Encounter (Signed)
Spoke with wife again in person late this morning. Patient is staying overnight and will reassess tomorrow. He is still on the transfer list to [facility] awaiting a bed. If wife calls back, please let her know that she can have her RN page the [facility] inpatient team.

## 2017-04-27 NOTE — Telephone Encounter (Signed)
Wife called and wanted Dr. [name] to call her. She said that Vincent Ochoa still has to have the hearth cath and chapel wanted to know when he was going home. Please call to discuss. jw

## 2017-04-28 DIAGNOSIS — R188 Other ascites: Secondary | ICD-10-CM | POA: Diagnosis not present

## 2017-04-28 DIAGNOSIS — I130 Hypertensive heart and chronic kidney disease with heart failure and stage 1 through stage 4 chronic kidney disease, or unspecified chronic kidney disease: Secondary | ICD-10-CM | POA: Diagnosis not present

## 2017-04-28 DIAGNOSIS — Z4682 Encounter for fitting and adjustment of non-vascular catheter: Secondary | ICD-10-CM | POA: Diagnosis not present

## 2017-04-28 DIAGNOSIS — R0603 Acute respiratory distress: Secondary | ICD-10-CM | POA: Diagnosis not present

## 2017-04-28 DIAGNOSIS — S37009A Unspecified injury of unspecified kidney, initial encounter: Secondary | ICD-10-CM | POA: Diagnosis not present

## 2017-04-28 DIAGNOSIS — Z01810 Encounter for preprocedural cardiovascular examination: Secondary | ICD-10-CM | POA: Diagnosis not present

## 2017-04-28 DIAGNOSIS — K7200 Acute and subacute hepatic failure without coma: Secondary | ICD-10-CM | POA: Diagnosis not present

## 2017-04-28 DIAGNOSIS — D649 Anemia, unspecified: Secondary | ICD-10-CM | POA: Diagnosis not present

## 2017-04-28 DIAGNOSIS — K766 Portal hypertension: Secondary | ICD-10-CM | POA: Diagnosis not present

## 2017-04-28 DIAGNOSIS — J9600 Acute respiratory failure, unspecified whether with hypoxia or hypercapnia: Secondary | ICD-10-CM | POA: Diagnosis not present

## 2017-04-28 DIAGNOSIS — K7290 Hepatic failure, unspecified without coma: Secondary | ICD-10-CM | POA: Diagnosis not present

## 2017-04-28 DIAGNOSIS — J690 Pneumonitis due to inhalation of food and vomit: Secondary | ICD-10-CM | POA: Diagnosis not present

## 2017-04-28 DIAGNOSIS — F331 Major depressive disorder, recurrent, moderate: Secondary | ICD-10-CM | POA: Diagnosis not present

## 2017-04-28 DIAGNOSIS — N179 Acute kidney failure, unspecified: Secondary | ICD-10-CM | POA: Diagnosis not present

## 2017-04-28 DIAGNOSIS — M6250 Muscle wasting and atrophy, not elsewhere classified, unspecified site: Secondary | ICD-10-CM | POA: Diagnosis not present

## 2017-04-28 DIAGNOSIS — R579 Shock, unspecified: Secondary | ICD-10-CM | POA: Diagnosis not present

## 2017-04-28 DIAGNOSIS — R0989 Other specified symptoms and signs involving the circulatory and respiratory systems: Secondary | ICD-10-CM | POA: Diagnosis not present

## 2017-04-28 DIAGNOSIS — J9690 Respiratory failure, unspecified, unspecified whether with hypoxia or hypercapnia: Secondary | ICD-10-CM | POA: Diagnosis not present

## 2017-04-28 DIAGNOSIS — K6389 Other specified diseases of intestine: Secondary | ICD-10-CM | POA: Diagnosis not present

## 2017-04-28 DIAGNOSIS — R918 Other nonspecific abnormal finding of lung field: Secondary | ICD-10-CM | POA: Diagnosis not present

## 2017-04-28 DIAGNOSIS — G9340 Encephalopathy, unspecified: Secondary | ICD-10-CM | POA: Diagnosis not present

## 2017-04-28 DIAGNOSIS — Z862 Personal history of diseases of the blood and blood-forming organs and certain disorders involving the immune mechanism: Secondary | ICD-10-CM | POA: Diagnosis not present

## 2017-04-28 DIAGNOSIS — K922 Gastrointestinal hemorrhage, unspecified: Secondary | ICD-10-CM | POA: Diagnosis not present

## 2017-04-28 DIAGNOSIS — K7460 Unspecified cirrhosis of liver: Secondary | ICD-10-CM | POA: Diagnosis not present

## 2017-04-28 DIAGNOSIS — Z515 Encounter for palliative care: Secondary | ICD-10-CM | POA: Diagnosis not present

## 2017-04-28 DIAGNOSIS — D696 Thrombocytopenia, unspecified: Secondary | ICD-10-CM | POA: Diagnosis not present

## 2017-04-28 DIAGNOSIS — K56609 Unspecified intestinal obstruction, unspecified as to partial versus complete obstruction: Secondary | ICD-10-CM | POA: Diagnosis not present

## 2017-04-28 DIAGNOSIS — I2510 Atherosclerotic heart disease of native coronary artery without angina pectoris: Secondary | ICD-10-CM | POA: Diagnosis not present

## 2017-04-28 DIAGNOSIS — B009 Herpesviral infection, unspecified: Secondary | ICD-10-CM | POA: Diagnosis not present

## 2017-04-28 DIAGNOSIS — N186 End stage renal disease: Secondary | ICD-10-CM | POA: Diagnosis not present

## 2017-04-28 DIAGNOSIS — Z452 Encounter for adjustment and management of vascular access device: Secondary | ICD-10-CM | POA: Diagnosis not present

## 2017-04-28 DIAGNOSIS — E871 Hypo-osmolality and hyponatremia: Secondary | ICD-10-CM | POA: Diagnosis not present

## 2017-04-28 DIAGNOSIS — K769 Liver disease, unspecified: Secondary | ICD-10-CM | POA: Diagnosis not present

## 2017-04-28 DIAGNOSIS — E872 Acidosis: Secondary | ICD-10-CM | POA: Diagnosis not present

## 2017-04-28 DIAGNOSIS — Z992 Dependence on renal dialysis: Secondary | ICD-10-CM | POA: Diagnosis not present

## 2017-04-28 DIAGNOSIS — K7469 Other cirrhosis of liver: Secondary | ICD-10-CM | POA: Diagnosis not present

## 2017-04-28 DIAGNOSIS — K920 Hematemesis: Secondary | ICD-10-CM | POA: Diagnosis not present

## 2017-04-28 DIAGNOSIS — K648 Other hemorrhoids: Secondary | ICD-10-CM | POA: Diagnosis not present

## 2017-04-28 DIAGNOSIS — D693 Immune thrombocytopenic purpura: Secondary | ICD-10-CM | POA: Diagnosis not present

## 2017-04-28 DIAGNOSIS — N189 Chronic kidney disease, unspecified: Secondary | ICD-10-CM | POA: Diagnosis not present

## 2017-04-28 DIAGNOSIS — K767 Hepatorenal syndrome: Secondary | ICD-10-CM | POA: Diagnosis not present

## 2017-04-28 DIAGNOSIS — J811 Chronic pulmonary edema: Secondary | ICD-10-CM | POA: Diagnosis not present

## 2017-04-28 DIAGNOSIS — R4183 Borderline intellectual functioning: Secondary | ICD-10-CM | POA: Diagnosis not present

## 2017-04-28 DIAGNOSIS — J9811 Atelectasis: Secondary | ICD-10-CM | POA: Diagnosis not present

## 2017-04-28 DIAGNOSIS — Z66 Do not resuscitate: Secondary | ICD-10-CM | POA: Diagnosis not present

## 2017-04-28 DIAGNOSIS — Z9981 Dependence on supplemental oxygen: Secondary | ICD-10-CM | POA: Diagnosis not present

## 2017-04-28 DIAGNOSIS — K567 Ileus, unspecified: Secondary | ICD-10-CM | POA: Diagnosis not present

## 2017-04-28 DIAGNOSIS — Z01818 Encounter for other preprocedural examination: Secondary | ICD-10-CM | POA: Diagnosis not present

## 2017-04-28 DIAGNOSIS — Z9911 Dependence on respirator [ventilator] status: Secondary | ICD-10-CM | POA: Diagnosis not present

## 2017-04-28 DIAGNOSIS — R4182 Altered mental status, unspecified: Secondary | ICD-10-CM | POA: Diagnosis not present

## 2017-04-28 DIAGNOSIS — G92 Toxic encephalopathy: Secondary | ICD-10-CM | POA: Diagnosis not present

## 2017-04-28 DIAGNOSIS — D62 Acute posthemorrhagic anemia: Secondary | ICD-10-CM | POA: Diagnosis not present

## 2017-04-28 DIAGNOSIS — K625 Hemorrhage of anus and rectum: Secondary | ICD-10-CM | POA: Diagnosis not present

## 2017-04-28 MED ORDER — OCTREOTIDE ACETATE 100 MCG/ML IJ SOLN: 200.0000 ug | Freq: Three times a day (TID) | INTRAMUSCULAR | 0 refills | Status: AC

## 2017-04-28 MED ORDER — RIFAXIMIN 550 MG PO TABS: 550.0000 mg | ORAL_TABLET | Freq: Two times a day (BID) | ORAL | 0 refills | Status: AC

## 2017-04-28 NOTE — Progress Notes (Signed)
Notified Dr. Avon Gully that pt is transferring to [place] that pt has gotten bed assignment. Will continue to monitor pt. Ranelle Oyster, RN (charge nurse)

## 2017-04-28 NOTE — Progress Notes (Signed)
Patient transferred to [place] via stretcher  carelink.patient's vital signs are BP 124/77,HR 90,Temp 98.7,respirstion 18 and spo2 99% no complain of pain..Report given to Jeanette Caprice RN room no [id number] from [facility].All belongings send with family member.

## 2017-04-29 LAB — CULTURE, BODY FLUID-BOTTLE: CULTURE: NO GROWTH

## 2017-05-02 ENCOUNTER — Ambulatory Visit (HOSPITAL_COMMUNITY): Admission: RE | Admit: 2017-05-02 | Payer: Medicare HMO | Source: Ambulatory Visit

## 2017-05-21 DEATH — deceased
